# Patient Record
Sex: Male | Born: 1955
Health system: Southern US, Community
[De-identification: ages and names within clinical notes are randomized; demographics above are authoritative.]

## PROBLEM LIST (undated history)

## (undated) DIAGNOSIS — H40009 Preglaucoma, unspecified, unspecified eye: Secondary | ICD-10-CM

## (undated) DIAGNOSIS — Z8719 Personal history of other diseases of the digestive system: Secondary | ICD-10-CM

## (undated) DIAGNOSIS — Z973 Presence of spectacles and contact lenses: Secondary | ICD-10-CM

## (undated) DIAGNOSIS — R350 Frequency of micturition: Secondary | ICD-10-CM

## (undated) DIAGNOSIS — K219 Gastro-esophageal reflux disease without esophagitis: Secondary | ICD-10-CM

## (undated) DIAGNOSIS — E119 Type 2 diabetes mellitus without complications: Secondary | ICD-10-CM

## (undated) DIAGNOSIS — C61 Malignant neoplasm of prostate: Secondary | ICD-10-CM

## (undated) DIAGNOSIS — E785 Hyperlipidemia, unspecified: Secondary | ICD-10-CM

## (undated) DIAGNOSIS — G4733 Obstructive sleep apnea (adult) (pediatric): Secondary | ICD-10-CM

## (undated) DIAGNOSIS — Z860101 Personal history of adenomatous and serrated colon polyps: Secondary | ICD-10-CM

## (undated) DIAGNOSIS — E041 Nontoxic single thyroid nodule: Secondary | ICD-10-CM

## (undated) DIAGNOSIS — T8859XA Other complications of anesthesia, initial encounter: Secondary | ICD-10-CM

## (undated) DIAGNOSIS — S83207A Unspecified tear of unspecified meniscus, current injury, left knee, initial encounter: Secondary | ICD-10-CM

## (undated) DIAGNOSIS — I4510 Unspecified right bundle-branch block: Secondary | ICD-10-CM

## (undated) DIAGNOSIS — I10 Essential (primary) hypertension: Secondary | ICD-10-CM

## (undated) DIAGNOSIS — N182 Chronic kidney disease, stage 2 (mild): Secondary | ICD-10-CM

## (undated) DIAGNOSIS — R32 Unspecified urinary incontinence: Secondary | ICD-10-CM

## (undated) DIAGNOSIS — G8929 Other chronic pain: Secondary | ICD-10-CM

## (undated) DIAGNOSIS — M1990 Unspecified osteoarthritis, unspecified site: Secondary | ICD-10-CM

## (undated) DIAGNOSIS — S86019A Strain of unspecified Achilles tendon, initial encounter: Secondary | ICD-10-CM

## (undated) DIAGNOSIS — Z9989 Dependence on other enabling machines and devices: Secondary | ICD-10-CM

## (undated) DIAGNOSIS — M109 Gout, unspecified: Secondary | ICD-10-CM

## (undated) DIAGNOSIS — G629 Polyneuropathy, unspecified: Secondary | ICD-10-CM

## (undated) DIAGNOSIS — J309 Allergic rhinitis, unspecified: Secondary | ICD-10-CM

## (undated) DIAGNOSIS — T4145XA Adverse effect of unspecified anesthetic, initial encounter: Secondary | ICD-10-CM

## (undated) DIAGNOSIS — Z86010 Personal history of colonic polyps: Secondary | ICD-10-CM

## (undated) DIAGNOSIS — M549 Dorsalgia, unspecified: Secondary | ICD-10-CM

## (undated) HISTORY — PX: UVULOPALATOPHARYNGOPLASTY (UPPP)/TONSILLECTOMY/SEPTOPLASTY: SHX6164

## (undated) HISTORY — DX: Strain of unspecified achilles tendon, initial encounter: S86.019A

## (undated) HISTORY — PX: CARDIAC CATHETERIZATION: SHX172

## (undated) HISTORY — PX: BACK SURGERY: SHX140

---

## 1980-01-17 HISTORY — PX: OTHER SURGICAL HISTORY: SHX169

## 1988-09-16 HISTORY — PX: UVULOPALATOPHARYNGOPLASTY (UPPP)/TONSILLECTOMY/SEPTOPLASTY: SHX6164

## 1998-01-25 ENCOUNTER — Ambulatory Visit: Admission: RE | Admit: 1998-01-25 | Discharge: 1998-01-25 | Payer: Self-pay | Admitting: Internal Medicine

## 1998-04-04 ENCOUNTER — Ambulatory Visit: Admission: RE | Admit: 1998-04-04 | Discharge: 1998-04-04 | Payer: Self-pay | Admitting: Otolaryngology

## 1998-07-01 ENCOUNTER — Ambulatory Visit: Admission: RE | Admit: 1998-07-01 | Discharge: 1998-07-01 | Payer: Self-pay | Admitting: Otolaryngology

## 1998-07-01 ENCOUNTER — Encounter: Payer: Self-pay | Admitting: Otolaryngology

## 1998-07-16 ENCOUNTER — Ambulatory Visit (HOSPITAL_COMMUNITY): Admission: RE | Admit: 1998-07-16 | Discharge: 1998-07-17 | Payer: Self-pay | Admitting: Otolaryngology

## 1998-11-22 ENCOUNTER — Ambulatory Visit: Admission: RE | Admit: 1998-11-22 | Discharge: 1998-11-22 | Payer: Self-pay | Admitting: Otolaryngology

## 1999-08-05 ENCOUNTER — Emergency Department (HOSPITAL_COMMUNITY): Admission: EM | Admit: 1999-08-05 | Discharge: 1999-08-05 | Payer: Self-pay | Admitting: Emergency Medicine

## 1999-08-05 ENCOUNTER — Encounter: Payer: Self-pay | Admitting: Surgery

## 1999-08-23 ENCOUNTER — Ambulatory Visit (HOSPITAL_COMMUNITY): Admission: RE | Admit: 1999-08-23 | Discharge: 1999-08-23 | Payer: Self-pay | Admitting: Gastroenterology

## 2002-06-04 ENCOUNTER — Encounter: Payer: Self-pay | Admitting: General Surgery

## 2002-06-05 ENCOUNTER — Ambulatory Visit (HOSPITAL_COMMUNITY): Admission: RE | Admit: 2002-06-05 | Discharge: 2002-06-05 | Payer: Self-pay | Admitting: General Surgery

## 2002-06-05 HISTORY — PX: ANAL FISSURE REPAIR: SHX2312

## 2006-12-17 LAB — HM COLONOSCOPY

## 2007-02-25 ENCOUNTER — Ambulatory Visit (HOSPITAL_COMMUNITY): Admission: RE | Admit: 2007-02-25 | Discharge: 2007-02-25 | Payer: Self-pay | Admitting: Gastroenterology

## 2007-12-05 ENCOUNTER — Ambulatory Visit (HOSPITAL_COMMUNITY): Admission: RE | Admit: 2007-12-05 | Discharge: 2007-12-05 | Payer: Self-pay | Admitting: Internal Medicine

## 2007-12-31 ENCOUNTER — Encounter (HOSPITAL_COMMUNITY): Admission: RE | Admit: 2007-12-31 | Discharge: 2008-01-14 | Payer: Self-pay | Admitting: Cardiology

## 2008-01-01 HISTORY — PX: CARDIOVASCULAR STRESS TEST: SHX262

## 2009-08-05 ENCOUNTER — Ambulatory Visit (HOSPITAL_COMMUNITY): Admission: RE | Admit: 2009-08-05 | Discharge: 2009-08-05 | Payer: Self-pay | Admitting: Gastroenterology

## 2009-08-07 LAB — HM COLONOSCOPY

## 2010-02-06 ENCOUNTER — Encounter: Payer: Self-pay | Admitting: Gastroenterology

## 2010-04-02 LAB — GLUCOSE, CAPILLARY: Glucose-Capillary: 91 mg/dL (ref 70–99)

## 2010-05-25 ENCOUNTER — Other Ambulatory Visit (HOSPITAL_COMMUNITY): Payer: Self-pay | Admitting: Internal Medicine

## 2010-05-25 ENCOUNTER — Ambulatory Visit (HOSPITAL_COMMUNITY)
Admission: RE | Admit: 2010-05-25 | Discharge: 2010-05-25 | Disposition: A | Discharge status: Home / Self Care | Payer: BC Managed Care – PPO | Source: Ambulatory Visit | Attending: Internal Medicine | Admitting: Internal Medicine

## 2010-05-25 DIAGNOSIS — W06XXXA Fall from bed, initial encounter: Secondary | ICD-10-CM | POA: Insufficient documentation

## 2010-05-25 DIAGNOSIS — M542 Cervicalgia: Secondary | ICD-10-CM

## 2010-06-03 NOTE — Op Note (Signed)
   NAMEESMOND, HINCH                          ACCOUNT NO.:  [id number]   MEDICAL RECORD NO.:  [id number]                   PATIENT TYPE:  AMB   LOCATION:  DAY                                  FACILITY:  [facility]   PHYSICIAN:  Timothy E. Earlene Plater, M.D.              DATE OF BIRTH:  May 18, 1955   DATE OF PROCEDURE:  06/05/2002  DATE OF DISCHARGE:                                 OPERATIVE REPORT   PREOPERATIVE DIAGNOSIS:  Anal fissure.   POSTOPERATIVE DIAGNOSIS:  Anal fissure.   PROCEDURE:  Repair of anal fissure.   SURGEON:  Timothy E. Earlene Plater, M.D.   ANESTHESIA:  General.   INDICATIONS FOR PROCEDURE:  Mr. Riedl has been seen and followed in the  office. He has failed conservative management. The anal fissure and its  attended pain and bleeding have persisted and after careful discussion, he  wishes to proceed with surgery. He was not able to follow a liquid diet or  all of the usual conservative management. His attendant factors include  morbid obesity, hypertension. He was identified and the permit signed,  evaluated by anesthesia.  A normal chemistry profile, CBC and urinalysis was  noted. A normal chest x-ray was noted and an abnormal EKG was noted.   DESCRIPTION OF PROCEDURE:  He was taken to the operating room, placed  supine, general endotracheal anesthesia administered. He was placed in  lithotomy position. Perianal area inspected, prepped and draped in the usual  fashion. The anus was spastic even under a general LMA anesthesia. An acute  posterior anal fissure was present. Digital exam was otherwise negative. The  internal sphincter was easily isolated and using a percutaneous 15 blade in  the left lateral position, an internal sphincterotomy was accomplished. This  allowed for dilatation. The operating anoscope introduced, no other  pathology noted. The fissure was cauterized in its entirety on the surface  only. This completed the procedure, Gelfoam gauze and a dry sterile  dressing  applied. He was awakened and taken to the recovery room in good condition.   Written and verbal instructions including Percocet 5 mg #36 and he will be  followed as an outpatient.                                               Timothy E. Earlene Plater, M.D.    TED/MEDQ  D:  06/05/2002  T:  06/05/2002  Job:  [id number]   cc:   Lovenia Kim, D.O.  [address]  [place]  [place] [address]  Fax: [phone]   Bernette Redbird, M.D.  [address]  [place], [place] [address]  Fax: [phone]

## 2010-06-03 NOTE — Procedures (Signed)
[facility]  Patient:    Jason Wyatt, Jason Wyatt                       MRN: [id number] Proc. Date: 08/23/99 Adm. Date:  [id number] Attending:  Deneen Harts CC:         Ammie Dalton, M.D.                           Procedure Report  PROCEDURE:  Colonoscopy.  INDICATION FOR PROCEDURE:  A 55 year old white male undergoing colonoscopy for neoplasia surveillance, to further evaluate Hemoccult positive stool, and because of right lower quadrant abdominal pain beginning approximately 1 month ago.  DESCRIPTION OF PROCEDURE:  After reviewing the nature of the procedure with the patient including the potential risks of hemorrhage and perforation, informed consent was signed.  The patient was premedicated receiving IV sedation totaling Versed 10 mg, fentanyl 100 mcg administered in divided doses prior to and during the course of the procedure.  Using an Olympus CF-140L video colonoscope, the rectum was intubated after a digital examination revealed no evidence of perianal or intrarectal pathology. The scope was advanced around the entire length of the colon to the cecum identified by the appendiceal orifice and ileocecal valve. Deep intubation of the cecum was difficult and required rotating this obese patient into a right lateral decubitus position. With this maneuver, successful study was completed. The cecum was identified by the appendiceal orifice and the ileocecal valve. Preparation was good to the right colon where it was poor due to retained stool. This was irrigated clear as much as possible.  The scope was slowly withdrawn with careful inspection throughout the entire colon in a retrograde manner.  No abnormality was noted. Specifically, there was no evidence of neoplasia, diverticular change, mucosal inflammation or vascular lesion. Normal retroflexed view in the rectal vault.  The colon was decompressed, scope withdrawn. The patient tolerated  the procedure without difficulty being maintained on DataScope monitor and low flow oxygen throughout.  Time 2, technical 2, preparation 2, total score equal 6.  ASSESSMENT: 1. Normal colonoscopy. 2. No explanation for Hemoccult positive stool o right lower quadrant    pain. Suspect occult positive stool may be due to anorectal source. The    right lower quadrant pain is extremely superficial, doubt GI origin.  RECOMMENDATIONS: 1. Colonoscopy--repeat in 10 years for neoplasia surveillance. 2. Annual Hemoccult--per Dr. Theda Belfast. 3. Consider laparoscopy if right lower quadrant pain persists. Also consider    abdominal/pelvic CT scan. 4. Rectal care p.r.n.--if patient notes bright red blood per rectum initiate    therapy with Anusol-HC suppository 1 p.r. b.i.d. for 5-7 days. DD:  08/23/99 TD:  08/24/99 Job: [id number]

## 2010-06-03 NOTE — Consult Note (Signed)
[place]  Patient:    Jason Wyatt, Jason Wyatt                       MRN: [id number] Proc. Date: 08/05/99 Adm. Date:  [id number] Disc. Date: [id number] Attending:  Osvaldo Human CC:         Ammie Dalton, M.D.                          Consultation Report  CHIEF COMPLAINT:  Abdominal pain.  HISTORY OF PRESENT ILLNESS:  Mr. Rayder Sullenger is a 55 year old morbidly obese gentleman.  He is a patient of Dr. Theda Belfast who has come in complaining of a several week history of worsening right lower quadrant abdominal pain.  He reports the pain came on gradually several weeks ago and now has gotten worse especially when he stands up and moves.  He reports he has also developed some nausea with this.  He denies any anorexia, he denies any fevers or chills.  He has had no emesis.  He reports he moves his bowel normal.  He has no dysuria, and he has basically no other complaints.  PAST MEDICAL HISTORY:  Unremarkable for morbid obesity.  MEDICATIONS:  Hypertension medication.  PAST SURGICAL HISTORY:  Negative.  ALLERGIES:  He has no known drug allergies.  SOCIAL HISTORY:  He does not smoke.  He does not drink alcohol.  PHYSICAL EXAMINATION:  GENERAL:  Reveals a morbidly obese gentleman in no acute distress.  He is afebrile.  His vital signs are stable.  HEENT:  He is anicteric.  Oropharynx clear.  NECK:   Supple.  LUNGS:  Clear to auscultation bilaterally.  CARDIOVASCULAR:  Regular rate and rhythm.  ABDOMEN:  Very obese. There is some mild tenderness with guarding in the right lower quadrant with him both lying and standing.  No hernias to be detected.  EXTREMITIES:  Warm and well perfused.  LABORATORY DATA:  The patient has multiple laboratory data including normal electrolytes, normal liver function tests, normal lipase.  CBC shows a white blood count of 11.6, with normal hemoglobin, hematocrit and platelets.  The patient has an acute  abdominal series that shows a normal bowel gas pattern.  He also has a CAT scan of the abdomen and pelvis that shows normal liver, kidneys, pancreas and spleen as well as a normal appendix and no intra-abdominal pathology.  IMPRESSION:  Patient with right lower quadrant abdominal pain uncertain etiology.  This may represent a muscle strain.  Again, I cannot feel a hernia and CAT scan shows no evidence of intra-abdominal inflammation.  At this point the plan is to let him go home from the emergency department.  He will follow up with Dr. Theda Belfast via phone call Monday.  He will follow up with surgeons on a p.r.n. basis. DD:  08/05/99 TD:  08/08/99 Job: [id number]

## 2010-06-12 ENCOUNTER — Emergency Department (HOSPITAL_COMMUNITY): Payer: BC Managed Care – PPO

## 2010-06-12 ENCOUNTER — Emergency Department (HOSPITAL_COMMUNITY)
Admission: EM | Admit: 2010-06-12 | Discharge: 2010-06-12 | Disposition: A | Discharge status: Home / Self Care | Payer: BC Managed Care – PPO | Attending: Emergency Medicine | Admitting: Emergency Medicine

## 2010-06-12 DIAGNOSIS — I10 Essential (primary) hypertension: Secondary | ICD-10-CM | POA: Insufficient documentation

## 2010-06-12 DIAGNOSIS — Z79899 Other long term (current) drug therapy: Secondary | ICD-10-CM | POA: Insufficient documentation

## 2010-06-12 DIAGNOSIS — M79609 Pain in unspecified limb: Secondary | ICD-10-CM | POA: Insufficient documentation

## 2010-06-12 DIAGNOSIS — M542 Cervicalgia: Secondary | ICD-10-CM | POA: Insufficient documentation

## 2010-06-12 DIAGNOSIS — E119 Type 2 diabetes mellitus without complications: Secondary | ICD-10-CM | POA: Insufficient documentation

## 2010-06-12 DIAGNOSIS — E785 Hyperlipidemia, unspecified: Secondary | ICD-10-CM | POA: Insufficient documentation

## 2010-06-12 DIAGNOSIS — M4800 Spinal stenosis, site unspecified: Secondary | ICD-10-CM

## 2010-06-14 ENCOUNTER — Ambulatory Visit
Admit: 2010-06-14 | Discharge: 2010-06-14 | Disposition: A | Discharge status: Home / Self Care | Payer: BC Managed Care – PPO | Attending: Neurosurgery | Admitting: Neurosurgery

## 2010-06-29 ENCOUNTER — Other Ambulatory Visit (HOSPITAL_COMMUNITY): Payer: Self-pay | Admitting: Neurosurgery

## 2010-06-29 ENCOUNTER — Ambulatory Visit (HOSPITAL_COMMUNITY)
Admission: RE | Admit: 2010-06-29 | Discharge: 2010-06-29 | Disposition: A | Discharge status: Home / Self Care | Payer: BC Managed Care – PPO | Source: Ambulatory Visit | Attending: Neurosurgery | Admitting: Neurosurgery

## 2010-06-29 ENCOUNTER — Encounter (HOSPITAL_COMMUNITY)
Admission: RE | Admit: 2010-06-29 | Discharge: 2010-06-29 | Disposition: A | Discharge status: Home / Self Care | Payer: BC Managed Care – PPO | Source: Ambulatory Visit | Attending: Neurosurgery | Admitting: Neurosurgery

## 2010-06-29 DIAGNOSIS — Z01818 Encounter for other preprocedural examination: Secondary | ICD-10-CM | POA: Insufficient documentation

## 2010-06-29 DIAGNOSIS — Z01812 Encounter for preprocedural laboratory examination: Secondary | ICD-10-CM | POA: Insufficient documentation

## 2010-06-29 DIAGNOSIS — Z01811 Encounter for preprocedural respiratory examination: Secondary | ICD-10-CM

## 2010-06-29 LAB — BASIC METABOLIC PANEL
BUN: 17 mg/dL (ref 6–23)
CO2: 29 mEq/L (ref 19–32)
Calcium: 9.3 mg/dL (ref 8.4–10.5)
Chloride: 107 mEq/L (ref 96–112)
Creatinine, Ser: 1.03 mg/dL (ref 0.4–1.5)
GFR calc Af Amer: >60 mL/min (ref >60)
GFR calc non Af Amer: >60 mL/min (ref >60)
Glucose, Bld: 115 mg/dL — ABNORMAL HIGH (ref 70–99)
Potassium: 4.5 mEq/L (ref 3.5–5.1)
Sodium: 144 mEq/L (ref 135–145)

## 2010-06-29 LAB — CBC
HCT: 44.2 % (ref 39.0–52.0)
Hemoglobin: 14.7 g/dL (ref 13.0–17.0)
MCH: 27.3 pg (ref 26.0–34.0)
MCHC: 33.3 g/dL (ref 30.0–36.0)
MCV: 82 fL (ref 78.0–100.0)
Platelets: 218 10*3/uL (ref 150–400)
RBC: 5.39 MIL/uL (ref 4.22–5.81)
RDW: 17.1 % — ABNORMAL HIGH (ref 11.5–15.5)
WBC: 9.5 10*3/uL (ref 4.0–10.5)

## 2010-06-29 LAB — SURGICAL PCR SCREEN
MRSA, PCR: NEGATIVE
Staphylococcus aureus: NEGATIVE

## 2010-07-05 ENCOUNTER — Inpatient Hospital Stay (HOSPITAL_COMMUNITY)
Admission: RE | Admit: 2010-07-05 | Discharge: 2010-07-08 | DRG: 558 | Disposition: A | Discharge status: Home Health | Payer: BC Managed Care – PPO | Source: Ambulatory Visit | Attending: Neurosurgery | Admitting: Neurosurgery

## 2010-07-05 ENCOUNTER — Inpatient Hospital Stay (HOSPITAL_COMMUNITY): Payer: BC Managed Care – PPO

## 2010-07-05 DIAGNOSIS — I9589 Other hypotension: Secondary | ICD-10-CM | POA: Diagnosis not present

## 2010-07-05 DIAGNOSIS — M4712 Other spondylosis with myelopathy, cervical region: Principal | ICD-10-CM | POA: Diagnosis present

## 2010-07-05 DIAGNOSIS — E7800 Pure hypercholesterolemia, unspecified: Secondary | ICD-10-CM | POA: Diagnosis present

## 2010-07-05 DIAGNOSIS — J9600 Acute respiratory failure, unspecified whether with hypoxia or hypercapnia: Secondary | ICD-10-CM

## 2010-07-05 DIAGNOSIS — J95821 Acute postprocedural respiratory failure: Secondary | ICD-10-CM | POA: Diagnosis not present

## 2010-07-05 DIAGNOSIS — I10 Essential (primary) hypertension: Secondary | ICD-10-CM | POA: Diagnosis present

## 2010-07-05 DIAGNOSIS — Z6841 Body Mass Index (BMI) 40.0 and over, adult: Secondary | ICD-10-CM

## 2010-07-05 DIAGNOSIS — Z9981 Dependence on supplemental oxygen: Secondary | ICD-10-CM

## 2010-07-05 DIAGNOSIS — K219 Gastro-esophageal reflux disease without esophagitis: Secondary | ICD-10-CM | POA: Diagnosis present

## 2010-07-05 DIAGNOSIS — G4733 Obstructive sleep apnea (adult) (pediatric): Secondary | ICD-10-CM | POA: Diagnosis present

## 2010-07-05 DIAGNOSIS — M109 Gout, unspecified: Secondary | ICD-10-CM | POA: Diagnosis present

## 2010-07-05 DIAGNOSIS — I2510 Atherosclerotic heart disease of native coronary artery without angina pectoris: Secondary | ICD-10-CM | POA: Diagnosis present

## 2010-07-05 DIAGNOSIS — M488X2 Other specified spondylopathies, cervical region: Secondary | ICD-10-CM | POA: Diagnosis present

## 2010-07-05 DIAGNOSIS — E119 Type 2 diabetes mellitus without complications: Secondary | ICD-10-CM | POA: Diagnosis present

## 2010-07-05 HISTORY — PX: POSTERIOR FUSION CERVICAL SPINE: SUR628

## 2010-07-05 LAB — CBC
HCT: 33.7 % — ABNORMAL LOW (ref 39.0–52.0)
Hemoglobin: 11.4 g/dL — ABNORMAL LOW (ref 13.0–17.0)
MCH: 27.7 pg (ref 26.0–34.0)
MCHC: 33.8 g/dL (ref 30.0–36.0)
MCV: 81.8 fL (ref 78.0–100.0)
Platelets: 154 10*3/uL (ref 150–400)
RBC: 4.12 MIL/uL — ABNORMAL LOW (ref 4.22–5.81)
RDW: 17.3 % — ABNORMAL HIGH (ref 11.5–15.5)
WBC: 7.9 10*3/uL (ref 4.0–10.5)

## 2010-07-05 LAB — BLOOD GAS, ARTERIAL
Acid-Base Excess: 0.3 mmol/L (ref 0.0–2.0)
Bicarbonate: 25.3 mEq/L — ABNORMAL HIGH (ref 20.0–24.0)
Drawn by: 34763
FIO2: 0.4 %
MECHVT: 620 mL
O2 Saturation: 94.1 %
PEEP: 5 cmH2O
Patient temperature: 98.6
RATE: 16 resp/min
TCO2: 26.8 mmol/L (ref 0–100)
pCO2 arterial: 48.2 mmHg — ABNORMAL HIGH (ref 35.0–45.0)
pH, Arterial: 7.341 — ABNORMAL LOW (ref 7.350–7.450)
pO2, Arterial: 74.2 mmHg — ABNORMAL LOW (ref 80.0–100.0)

## 2010-07-05 LAB — COMPREHENSIVE METABOLIC PANEL
ALT: 13 U/L (ref 0–53)
AST: 16 U/L (ref 0–37)
Albumin: 2.7 g/dL — ABNORMAL LOW (ref 3.5–5.2)
Alkaline Phosphatase: 40 U/L (ref 39–117)
BUN: 13 mg/dL (ref 6–23)
CO2: 26 mEq/L (ref 19–32)
Calcium: 8.2 mg/dL — ABNORMAL LOW (ref 8.4–10.5)
Chloride: 105 mEq/L (ref 96–112)
Creatinine, Ser: 0.89 mg/dL (ref 0.50–1.35)
GFR calc Af Amer: >60 mL/min (ref >60)
GFR calc non Af Amer: >60 mL/min (ref >60)
Glucose, Bld: 120 mg/dL — ABNORMAL HIGH (ref 70–99)
Potassium: 4 mEq/L (ref 3.5–5.1)
Sodium: 137 mEq/L (ref 135–145)
Total Bilirubin: 0.3 mg/dL (ref 0.3–1.2)
Total Protein: 5.3 g/dL — ABNORMAL LOW (ref 6.0–8.3)

## 2010-07-05 LAB — GLUCOSE, CAPILLARY
Glucose-Capillary: 91 mg/dL (ref 70–99)
Glucose-Capillary: 113 mg/dL — ABNORMAL HIGH (ref 70–99)
Glucose-Capillary: 124 mg/dL — ABNORMAL HIGH (ref 70–99)
Glucose-Capillary: 124 mg/dL — ABNORMAL HIGH (ref 70–99)

## 2010-07-05 LAB — CARDIAC PANEL(CRET KIN+CKTOT+MB+TROPI)
CK, MB: 5.8 ng/mL — ABNORMAL HIGH (ref 0.3–4.0)
Relative Index: 3.2 — ABNORMAL HIGH (ref 0.0–2.5)
Total CK: 181 U/L (ref 7–232)
Troponin I: <0.3 ng/mL (ref <0.30)

## 2010-07-06 DIAGNOSIS — G4730 Sleep apnea, unspecified: Secondary | ICD-10-CM

## 2010-07-06 LAB — BASIC METABOLIC PANEL
BUN: 11 mg/dL (ref 6–23)
CO2: 26 mEq/L (ref 19–32)
Calcium: 8.5 mg/dL (ref 8.4–10.5)
Chloride: 105 mEq/L (ref 96–112)
Creatinine, Ser: 0.84 mg/dL (ref 0.50–1.35)
GFR calc Af Amer: >60 mL/min (ref >60)
GFR calc non Af Amer: >60 mL/min (ref >60)
Glucose, Bld: 101 mg/dL — ABNORMAL HIGH (ref 70–99)
Potassium: 4 mEq/L (ref 3.5–5.1)
Sodium: 137 mEq/L (ref 135–145)

## 2010-07-06 LAB — TROPONIN I: Troponin I: <0.3 ng/mL (ref <0.30)

## 2010-07-06 LAB — GLUCOSE, CAPILLARY
Glucose-Capillary: 103 mg/dL — ABNORMAL HIGH (ref 70–99)
Glucose-Capillary: 104 mg/dL — ABNORMAL HIGH (ref 70–99)
Glucose-Capillary: 112 mg/dL — ABNORMAL HIGH (ref 70–99)
Glucose-Capillary: 121 mg/dL — ABNORMAL HIGH (ref 70–99)
Glucose-Capillary: 123 mg/dL — ABNORMAL HIGH (ref 70–99)
Glucose-Capillary: 99 mg/dL (ref 70–99)

## 2010-07-06 LAB — CK: Total CK: 186 U/L (ref 7–232)

## 2010-07-06 LAB — PHOSPHORUS: Phosphorus: 2.5 mg/dL (ref 2.3–4.6)

## 2010-07-06 LAB — MAGNESIUM: Magnesium: 1.8 mg/dL (ref 1.5–2.5)

## 2010-07-07 LAB — GLUCOSE, CAPILLARY
Glucose-Capillary: 95 mg/dL (ref 70–99)
Glucose-Capillary: 107 mg/dL — ABNORMAL HIGH (ref 70–99)
Glucose-Capillary: 133 mg/dL — ABNORMAL HIGH (ref 70–99)
Glucose-Capillary: 146 mg/dL — ABNORMAL HIGH (ref 70–99)

## 2010-07-08 LAB — GLUCOSE, CAPILLARY
Glucose-Capillary: 155 mg/dL — ABNORMAL HIGH (ref 70–99)
Glucose-Capillary: 161 mg/dL — ABNORMAL HIGH (ref 70–99)

## 2010-07-19 NOTE — Op Note (Signed)
NAMERAHUL, MALINAK NO.:  [id number]  MEDICAL RECORD NO.:  [id number]  LOCATION:  3102                         FACILITY:  [facility]  PHYSICIAN:  Danae Orleans. Venetia Maxon, M.D.  DATE OF BIRTH:  04/14/1955  DATE OF PROCEDURE:  07/05/2010 DATE OF DISCHARGE:                              OPERATIVE REPORT   PREOPERATIVE DIAGNOSIS:  Severe cervical stenosis with ossification of posterior longitudinal ligament with spondylosis with cervical myelopathy, C2 through C4 levels.  POSTOPERATIVE DIAGNOSIS:  Severe cervical stenosis with ossification of posterior longitudinal ligament with spondylosis with cervical myelopathy, C2 through C4 levels.  PROCEDURE:  Posterior cervical laminectomy C1-2 through C4 with C2 through C4 posterior instrumentation with posterolateral arthrodesis C2 through C4 levels.  SURGEON:  Danae Orleans. Venetia Maxon, MD  ASSISTANT:  Clydene Fake, MD  ANESTHESIA:  General endotracheal anesthesia.  ESTIMATED BLOOD LOSS:  400 mL.  COMPLICATIONS:  None.  DISPOSITION:  To recovery.  INDICATIONS:  Yovan Leeman is a 55 year old man with profound cervical stenosis at C2 through C4 levels with severe spinal cord compression at these levels.  He has a cervical myelopathy.  This appears to be consistent with ossification of the posterior longitudinal ligament.  It was elected to take him to surgery for posterior decompression and fixation.  Mr. Kinnear is morbidly obese with obstructive sleep apnea and diabetes with a history of coronary artery disease.  PROCEDURE:  Mr. Levene was brought to the operating room.  Following satisfactory and uncomplicated induction of general endotracheal anesthesia, the patient was placed in a cervical collar to be turned onto the operating table.  After about 45 minutes of careful positioning with taping and relining and maintaining the neck in a normal anatomical alignment, the patient was felt to be well positioned on the  operating table.  His posterior neck was then imaged with C-arm and C2 level was identified.  The skin was shaved, then prepped and draped with Betadine scrub and paint.  Area of planned incision was infiltrated with local lidocaine.  Incision was made in the posterior neck.  Through approximately 4 inches of adipose tissue to the posterior cervical fascia which was then incised and using subperiosteal dissection, the C2, C3, C4 levels were widely exposed.  This required use of an 85-mm retractor blades with Versatrac retractor system to move the soft tissue out of the operative field.  After confirming correct level at the level of C2 with a marker probe at C2, the 12 x 3.5-mm screws were placed at C3 and C4 bilaterally in the lateral masses according to the standard landmarks.  The posterior cervical decompression was then performed from C4 all the way to the inferior aspect of C1 and spinal cord dura was decompressed broadly.  This was done under loupe magnification with 2 and 3-mm gold tip Kerrison rongeurs.  It was then elected to place C2 pedicle screws.  Initially, this was done on the right side and the medial aspect of the pedicle was identified within the canal.  The positioning for entry was determined and using initially 12-mm drill and then subsequently exchanged for 24-mm drill and then final a 32-mm drill, the C2 pedicle was  drilled.  There was some what appeared to be venous bleeding and there was felt to be lateral breech in the trajectory of the pedicle screw.  This was not felt to represent injury to the vertebral artery but at the same time, we did not feel that it would be prudent to place an additional screw on the left in case there was some compromise of the vertebral artery within the foramen. Consequently, a 4 mm x 32-mm pedicle screw was placed on the right and its positioning was confirmed on fluoroscopy.  There was no medial breech of the pedicle.  The  posterolateral region was then extensively decorticated from C2 through C4 bilaterally using drill and the bone removed with a laminectomy was then placed in the posterolateral region and tamped into position.  A rod was lordosed to fit the C2 through C4 instrumentation on the right and a smaller rod was used for the instrumentation at C3 through C4 on the left.  All screws were locked down in situ and counter torqued appropriately.  Because of the patient's large size and prominent epidural veins,  it was elected to place a medium Hemovac drain and this was placed through separate stab incision and anchored with a 3-0 nylon stitch.  The retractor was removed.  The posterior fascia was reapproximated with 0 Vicryl sutures, skin edges were reapproximated with 2-0 Vicryl interrupted inverted sutures, and after 2-0 Vicryl stitches, the stapler was used to reapproximate skin edges.  Wound was dressed with a sterile occlusive dressing.  The patient was then returned to supine position on the stretcher.  I removed the three pin fixation and he was placed in a cervical collar.  He was moving all four extremities and following commands prior to extubation but upon being extubated was not able to phonate and it was elected to reintubate him in the operating room.  I then discussed his condition with the Critical Care Medicine Service and it was elected because the patient had some fairly prominent facial edema and because of his large size as well as his history of obstructive sleep apnea that we would plan on admitting to the ICU and that we would keep him intubated overnight and then wean him afterwards.     Danae Orleans. Venetia Maxon, M.D.     JDS/MEDQ  D:  07/05/2010  T:  07/06/2010  Job:  [id number]  Electronically Signed by Maeola Harman M.D. on 07/19/2010 08:49:19 AM

## 2010-07-22 NOTE — Discharge Summary (Signed)
  NAMETHADIUS, SMISEK NO.:  [id number]  MEDICAL RECORD NO.:  [id number]  LOCATION:  3033                         FACILITY:  [facility]  PHYSICIAN:  Danae Orleans. Venetia Maxon, M.D.  DATE OF BIRTH:  August 10, 1955  DATE OF ADMISSION:  07/05/2010 DATE OF DISCHARGE:  07/08/2010                              DISCHARGE SUMMARY   REASON FOR ADMISSION:  Severe cervical stenosis with ossification of posterior longitudinal ligament with spondylosis and cervical myelopathy C2 through C4 levels.  FINAL DIAGNOSIS:  Severe cervical stenosis with ossification of posterior longitudinal ligament with spondylosis and cervical myelopathy C2 through C4 levels.  ADDITIONAL DIAGNOSIS:  Respiratory failure immediately following extubation with overnight mechanical ventilation.  HISTORY OF PRESENT ILLNESS:  Jason Wyatt is a 55 year old man with cervical stenosis, C2 through C4 levels with severe spinal cord compression at these levels with cervical myelopathy which was felt to be consistent with ossification of the posterior longitudinal ligament. The patient is over 300 pounds and it was elected to take him to surgery for posterior decompression and fusion.  He is morbidly obese with obstructive sleep apnea, history of diabetes, and coronary artery disease.  The surgery was uneventful postoperatively, however, he was extubated and became apneic and then was reintubated.  He was taken to the Neuro ICU where he was observed and remained intubated overnight. He had a drain which has drained 40 mL the first night.  He was extubated, has Foley removed, was gradually mobilized, and with a Hemovac drain was removed on July 07, 2010.  He was transferred to the floor on July 07, 2010, and then discharged home on July 08, 2010, having tolerated his operation and hospitalization well.  DISCHARGE MEDICATIONS:  Include Percocet, methylfolate, Remitax, vitamin D, furosemide, phentermine, Androderm, metformin,  Cymbalta, Nasonex, lisinopril, lansoprazole, enalapril.  DISCHARGE CONDITION:  Improved.     Danae Orleans. Venetia Maxon, M.D.     JDS/MEDQ  D:  07/19/2010  T:  07/20/2010  Job:  [id number]  Electronically Signed by Maeola Harman M.D. on 07/22/2010 09:20:43 AM

## 2011-01-20 ENCOUNTER — Other Ambulatory Visit: Payer: Self-pay | Admitting: Pain Medicine

## 2011-01-20 DIAGNOSIS — M545 Low back pain, unspecified: Secondary | ICD-10-CM

## 2011-01-21 ENCOUNTER — Ambulatory Visit
Admission: RE | Admit: 2011-01-21 | Discharge: 2011-01-21 | Disposition: A | Discharge status: Home / Self Care | Payer: BC Managed Care – PPO | Source: Ambulatory Visit | Attending: Pain Medicine | Admitting: Pain Medicine

## 2011-01-21 DIAGNOSIS — M545 Low back pain, unspecified: Secondary | ICD-10-CM

## 2011-04-07 ENCOUNTER — Other Ambulatory Visit: Payer: Self-pay

## 2011-04-07 DIAGNOSIS — E1159 Type 2 diabetes mellitus with other circulatory complications: Secondary | ICD-10-CM

## 2011-04-07 DIAGNOSIS — I83893 Varicose veins of bilateral lower extremities with other complications: Secondary | ICD-10-CM

## 2011-06-23 ENCOUNTER — Encounter: Payer: Self-pay | Admitting: Vascular Surgery

## 2011-06-26 ENCOUNTER — Ambulatory Visit (INDEPENDENT_AMBULATORY_CARE_PROVIDER_SITE_OTHER): Payer: BC Managed Care – PPO | Admitting: *Deleted

## 2011-06-26 ENCOUNTER — Ambulatory Visit (INDEPENDENT_AMBULATORY_CARE_PROVIDER_SITE_OTHER): Payer: BC Managed Care – PPO | Admitting: Vascular Surgery

## 2011-06-26 ENCOUNTER — Encounter (INDEPENDENT_AMBULATORY_CARE_PROVIDER_SITE_OTHER): Payer: BC Managed Care – PPO | Admitting: *Deleted

## 2011-06-26 ENCOUNTER — Encounter: Payer: Self-pay | Admitting: Vascular Surgery

## 2011-06-26 VITALS — BP 145/77 | HR 108 | Resp 24 | Ht 74.0 in | Wt 375.0 lb

## 2011-06-26 DIAGNOSIS — G609 Hereditary and idiopathic neuropathy, unspecified: Secondary | ICD-10-CM

## 2011-06-26 DIAGNOSIS — R209 Unspecified disturbances of skin sensation: Secondary | ICD-10-CM

## 2011-06-26 DIAGNOSIS — I83893 Varicose veins of bilateral lower extremities with other complications: Secondary | ICD-10-CM

## 2011-06-26 DIAGNOSIS — R238 Other skin changes: Secondary | ICD-10-CM

## 2011-06-26 DIAGNOSIS — E1159 Type 2 diabetes mellitus with other circulatory complications: Secondary | ICD-10-CM

## 2011-06-26 DIAGNOSIS — I70209 Unspecified atherosclerosis of native arteries of extremities, unspecified extremity: Secondary | ICD-10-CM | POA: Insufficient documentation

## 2011-06-26 DIAGNOSIS — I8390 Asymptomatic varicose veins of unspecified lower extremity: Secondary | ICD-10-CM

## 2011-06-26 DIAGNOSIS — G629 Polyneuropathy, unspecified: Secondary | ICD-10-CM

## 2011-06-26 NOTE — Progress Notes (Signed)
Subjective:     Patient ID: Jason Wyatt, male   DOB: Aug 21, 1955, 56 y.o.   MRN: [id number]  HPI this 56 year old male was referred for leg pain and varicose veins. He has a long history of peripheral neuropathy from diabetes mellitus. He also has severe cervical and lumbar spine disease having previously had surgery on the cervical spine. He has had multiple injections in the lumbar spine. He has numbness in both legs and some instability with walking. He has no history of DVT, thrombophlebitis, stasis ulcers, bleeding, or severe edema in the ankles. It is not wear elastic compression stockings. He does have an ulcer on the sole of his right foot which is followed by a podiatrist.  Past Medical History  Diagnosis Date  . Varicose veins   . Diabetes mellitus     History  Substance Use Topics  . Smoking status: Never Smoker   . Smokeless tobacco: Not on file  . Alcohol Use: No    Family History  Problem Relation Age of Onset  . Hypertension Mother   . Cancer Father   . Hyperlipidemia Sister   . Hypertension Sister     Allergies  Allergen Reactions  . Lyrica (Pregabalin) Itching    Current outpatient prescriptions:acyclovir (ZOVIRAX) 400 MG tablet, Take 400 mg by mouth every 4 (four) hours while awake., Disp: , Rfl: ;  aspirin 81 MG tablet, Take 81 mg by mouth daily., Disp: , Rfl: ;  docusate sodium (COLACE) 100 MG capsule, Take 100 mg by mouth 2 (two) times daily., Disp: , Rfl: ;  DULoxetine (CYMBALTA) 60 MG capsule, Take 60 mg by mouth daily., Disp: , Rfl:  enalapril (VASOTEC) 20 MG tablet, Take 20 mg by mouth daily., Disp: , Rfl: ;  furosemide (LASIX) 40 MG tablet, Take 40 mg by mouth daily., Disp: , Rfl: ;  gabapentin (NEURONTIN) 300 MG capsule, Take 300 mg by mouth 3 (three) times daily., Disp: , Rfl: ;  glimepiride (AMARYL) 4 MG tablet, Take 4 mg by mouth daily before breakfast., Disp: , Rfl: ;  lansoprazole (PREVACID) 30 MG capsule, Take 30 mg by mouth daily., Disp: , Rfl:    metformin (FORTAMET) 1000 MG (OSM) 24 hr tablet, Take 1,000 mg by mouth daily with breakfast., Disp: , Rfl: ;  mometasone (NASONEX) 50 MCG/ACT nasal spray, Place 2 sprays into the nose daily., Disp: , Rfl: ;  omega-3 acid ethyl esters (LOVAZA) 1 G capsule, Take 2 g by mouth 2 (two) times daily., Disp: , Rfl: ;  simvastatin (ZOCOR) 20 MG tablet, Take 20 mg by mouth every evening., Disp: , Rfl:  testosterone cypionate (DEPOTESTOTERONE CYPIONATE) 100 MG/ML injection, Inject 200 mg into the muscle every 14 (fourteen) days. For IM use only, Disp: , Rfl:   BP 145/77  Pulse 108  Resp 24  Ht 6\' 2"  (1.88 m)  Wt 375 lb (170.099 kg)  BMI 48.15 kg/m2  Body mass index is 48.15 kg/(m^2).        Review of Systems denies chest pain. Does have a history of sleep apnea. Has occasional reflux esophagitis. Complains of numbness in both legs. No history of stroke or specific neurologic symptoms other than numbness.    Objective:   Physical Exam blood pressure 145/77 heart rate 108 respirations 24 Gen.-alert and oriented x3 in no apparent distress-obese  HEENT normal for age Lungs no rhonchi or wheezing Cardiovascular regular rhythm no murmurs carotid pulses 3+ palpable no bruits audible Abdomen soft nontender no palpable masses-obese  Musculoskeletal free of  major deformities Skin clear -no rashes Neurologic-decreased sensation both feet  Lower extremities 3+ femoral and posterior tibial pulses palpable bilaterally with no edema there are some small varicose veins in the posterior calf areas bilaterally below the knee. There is no hyperpigmentation or ulceration noted. No bulging varicosities along the greater saphenous system are noted.  Today I ordered a venous duplex exam of both legs which are reviewed and interpreted. He has some mild reflux in the left saphenofemoral junction but not throughout the great saphenous system. There is mild reflux in the deep systems. There is no DVT.       Assessment:     Bilateral leg pain with severe peripheral neuropathy and lumbar spine disease-no evidence of severe venous insufficiency Arterial supply to lower extremities is intact    Plan:     Return to see [place] on a when necessary basis. No specific treatment indicated for arterial or venous system

## 2011-07-03 NOTE — Procedures (Unsigned)
LOWER EXTREMITY VENOUS REFLUX EXAM  INDICATION:  Varicose veins  EXAM:  Using color-flow imaging and pulse Doppler spectral analysis, the bilateral common femoral, femoral, popliteal, posterior tibial, great and small saphenous veins were evaluated.  There is evidence suggesting deep venous reflux of >500 mL noted in the bilateral common femoral and femoral veins.  The right saphenofemoral junction and nontortuous great saphenous vein demonstrate competency.  The left saphenofemoral junction and nontortuous great saphenous vein demonstrate reflux of >500 milliseconds with diameter measurements as described below.   The bilateral proximal small saphenous veins demonstrate competency.  GSV Diameter (used if found to be incompetent only)                                           Right    Left Proximal Greater Saphenous Vein           cm       0.57 cm Proximal-to-mid-thigh                     cm       cm Mid thigh                                 cm       0.54 cm Mid-distal thigh                          cm       cm Distal thigh                              cm       0.48 cm Knee                                      cm       0.33 cm  IMPRESSION: 1. Left great saphenous vein reflux is noted, as described above. 2. Deep venous reflux is noted, as described above. 3. The right great saphenous vein and bilateral short saphenous veins     demonstrate competency.  ___________________________________________ Quita Skye Hart [place], M.D.  CH/MEDQ  D:  06/26/2011  T:  06/26/2011  Job:  [id number]

## 2011-07-05 ENCOUNTER — Emergency Department (HOSPITAL_BASED_OUTPATIENT_CLINIC_OR_DEPARTMENT_OTHER)
Admission: EM | Admit: 2011-07-05 | Discharge: 2011-07-05 | Disposition: A | Discharge status: Home / Self Care | Payer: BC Managed Care – PPO | Attending: Emergency Medicine | Admitting: Emergency Medicine

## 2011-07-05 ENCOUNTER — Encounter (HOSPITAL_BASED_OUTPATIENT_CLINIC_OR_DEPARTMENT_OTHER): Payer: Self-pay | Admitting: *Deleted

## 2011-07-05 DIAGNOSIS — E119 Type 2 diabetes mellitus without complications: Secondary | ICD-10-CM | POA: Insufficient documentation

## 2011-07-05 DIAGNOSIS — M109 Gout, unspecified: Secondary | ICD-10-CM | POA: Insufficient documentation

## 2011-07-05 DIAGNOSIS — E871 Hypo-osmolality and hyponatremia: Secondary | ICD-10-CM

## 2011-07-05 DIAGNOSIS — N289 Disorder of kidney and ureter, unspecified: Secondary | ICD-10-CM

## 2011-07-05 DIAGNOSIS — I10 Essential (primary) hypertension: Secondary | ICD-10-CM | POA: Insufficient documentation

## 2011-07-05 DIAGNOSIS — N390 Urinary tract infection, site not specified: Secondary | ICD-10-CM

## 2011-07-05 HISTORY — DX: Essential (primary) hypertension: I10

## 2011-07-05 HISTORY — DX: Gout, unspecified: M10.9

## 2011-07-05 LAB — URINALYSIS, ROUTINE W REFLEX MICROSCOPIC
Glucose, UA: NEGATIVE mg/dL
Ketones, ur: 15 mg/dL — AB
Nitrite: POSITIVE — AB
Protein, ur: 30 mg/dL — AB
Specific Gravity, Urine: 1.028 (ref 1.005–1.030)
Urobilinogen, UA: 1 mg/dL (ref 0.0–1.0)
pH: 5 (ref 5.0–8.0)

## 2011-07-05 LAB — COMPREHENSIVE METABOLIC PANEL
ALT: 13 U/L (ref 0–53)
AST: 16 U/L (ref 0–37)
Albumin: 3.1 g/dL — ABNORMAL LOW (ref 3.5–5.2)
Alkaline Phosphatase: 56 U/L (ref 39–117)
BUN: 28 mg/dL — ABNORMAL HIGH (ref 6–23)
CO2: 24 mEq/L (ref 19–32)
Calcium: 9.1 mg/dL (ref 8.4–10.5)
Chloride: 94 mEq/L — ABNORMAL LOW (ref 96–112)
Creatinine, Ser: 1.4 mg/dL — ABNORMAL HIGH (ref 0.50–1.35)
GFR calc Af Amer: 63 mL/min — ABNORMAL LOW (ref >90)
GFR calc non Af Amer: 54 mL/min — ABNORMAL LOW (ref >90)
Glucose, Bld: 106 mg/dL — ABNORMAL HIGH (ref 70–99)
Potassium: 4 mEq/L (ref 3.5–5.1)
Sodium: 129 mEq/L — ABNORMAL LOW (ref 135–145)
Total Bilirubin: 0.6 mg/dL (ref 0.3–1.2)
Total Protein: 7 g/dL (ref 6.0–8.3)

## 2011-07-05 LAB — CBC
HCT: 33 % — ABNORMAL LOW (ref 39.0–52.0)
Hemoglobin: 10.9 g/dL — ABNORMAL LOW (ref 13.0–17.0)
MCH: 23.6 pg — ABNORMAL LOW (ref 26.0–34.0)
MCHC: 33 g/dL (ref 30.0–36.0)
MCV: 71.6 fL — ABNORMAL LOW (ref 78.0–100.0)
Platelets: 289 10*3/uL (ref 150–400)
RBC: 4.61 MIL/uL (ref 4.22–5.81)
RDW: 19.5 % — ABNORMAL HIGH (ref 11.5–15.5)
WBC: 8.9 10*3/uL (ref 4.0–10.5)

## 2011-07-05 LAB — URINE MICROSCOPIC-ADD ON

## 2011-07-05 MED ORDER — SODIUM CHLORIDE 0.9 % IV BOLUS (SEPSIS)
1000.0000 mL | Freq: Once | INTRAVENOUS | Status: AC
  Administered 2011-07-05: 1000 mL via INTRAVENOUS

## 2011-07-05 MED ORDER — CEPHALEXIN 500 MG PO CAPS: 500.0000 mg | ORAL_CAPSULE | Freq: Four times a day (QID) | ORAL | Status: AC

## 2011-07-05 MED ORDER — DEXTROSE 5 % IV SOLN
1.0000 g | Freq: Once | INTRAVENOUS | Status: AC
  Administered 2011-07-05: 1 g via INTRAVENOUS
  Filled 2011-07-05: qty 10

## 2011-07-05 NOTE — Discharge Instructions (Signed)
Return to the ED with any concerns including fever, vomiting and not able to keep down liquids, abdominal pain, fainting, chest pain, decreased level of alertness/lethargy, or any other alarming symptoms

## 2011-07-05 NOTE — ED Notes (Signed)
One week history of fatigue and decreased appetite. Was placed on colchicine last week started having diarrhea this morning. States right ear feels stopped up and painful. Also off and on pains in head

## 2011-07-05 NOTE — ED Provider Notes (Signed)
History     CSN: [id number]  Arrival date & time 07/05/11  1622   First MD Initiated Contact with Patient 07/05/11 1710      Chief Complaint  Patient presents with  . Dysuria    hesitancy  . Otalgia  . Fatigue    (Consider location/radiation/quality/duration/timing/severity/associated sxs/prior treatment) HPI Pt c/o fatigue over the past several days, developed loose stool x 4 today.  No fever/chills.  No vomitnig.  Has had decreased appetite.  Pt was seen by his PMD and advised to come to ED to evaluate for UTI.  Pt denies abdominal pain.  No blood or mucous in stools.  No recent travel.  He denies dysuria.  There are no other alleviating or modifying factors, there are no other associated systemic symptoms Past Medical History  Diagnosis Date  . Varicose veins   . Diabetes mellitus   . Gout   . Hypertension   . Morbid obesity     Past Surgical History  Procedure Date  . Spine surgery     Family History  Problem Relation Age of Onset  . Hypertension Mother   . Cancer Father   . Hyperlipidemia Sister   . Hypertension Sister     History  Substance Use Topics  . Smoking status: Never Smoker   . Smokeless tobacco: Not on file  . Alcohol Use: No      Review of Systems ROS reviewed and all otherwise negative except for mentioned in HPI  Allergies  Lyrica  Home Medications   Current Outpatient Rx  Name Route Sig Dispense Refill  . ALLOPURINOL 100 MG PO TABS Oral Take 100 mg by mouth daily.    . ASPIRIN 81 MG PO TABS Oral Take 81 mg by mouth daily.    . COLCHICINE 0.6 MG PO TABS Oral Take 0.6 mg by mouth daily.    [name] DOCUSATE SODIUM 100 MG PO CAPS Oral Take 100 mg by mouth 2 (two) times daily. Patient uses this medication for stool softening.    . DULOXETINE HCL 60 MG PO CPEP Oral Take 60 mg by mouth daily.    . ENALAPRIL MALEATE 20 MG PO TABS Oral Take 20 mg by mouth daily.    [name] GABAPENTIN 300 MG PO CAPS Oral Take 300 mg by mouth 3 (three) times daily.      [name] GLIMEPIRIDE 4 MG PO TABS Oral Take 4 mg by mouth daily before breakfast.    . LANSOPRAZOLE 30 MG PO CPDR Oral Take 30 mg by mouth daily. Patient uses this medication for heartburn.    [name] METFORMIN HCL ER (OSM) 1000 MG PO TB24 Oral Take 1,000 mg by mouth daily with breakfast.    . OMEGA-3-ACID ETHYL ESTERS 1 G PO CAPS Oral Take 2 g by mouth 2 (two) times daily.    [name] SIMVASTATIN 20 MG PO TABS Oral Take 20 mg by mouth every evening.    . CEPHALEXIN 500 MG PO CAPS Oral Take 1 capsule (500 mg total) by mouth 4 (four) times daily. 28 capsule 0  . FUROSEMIDE 40 MG PO TABS Oral Take 40 mg by mouth daily.    . MOMETASONE FUROATE 50 MCG/ACT NA SUSP Nasal Place 2 sprays into the nose daily.    . TESTOSTERONE CYPIONATE 100 MG/ML IM OIL Intramuscular Inject 200 mg into the muscle every 14 (fourteen) days. For IM use only      BP 122/66  Pulse 87  Temp 99 F (37.2 C) (Oral)  Resp 20  SpO2 95% Vitals reviewed Physical Exam Physical Examination: General appearance - alert, obese, well appearing, and in no distress Mental status - alert, oriented to person, place, and time Eyes - pupils equal and reactive, no scleral icterus, no conjunctival injection Mouth - mucous membranes moist, pharynx normal without lesions Chest - clear to auscultation, no wheezes, rales or rhonchi, symmetric air entry Heart - normal rate, regular rhythm, normal S1, S2, no murmurs, rubs, clicks or gallops Abdomen - soft, nontender, nondistended, no masses or organomegaly, nabs Musculoskeletal - no joint tenderness, deformity or swelling Extremities - peripheral pulses normal, no pedal edema, no clubbing or cyanosis Skin - normal coloration and turgor, no rashes  ED Course  Procedures (including critical care time)  6:56 PM renal insufficiency- creat decreased from 1.48 on labs performed by PMD 2 days ago, BUN stable at 28.    Labs Reviewed  URINALYSIS, ROUTINE W REFLEX MICROSCOPIC - Abnormal; Notable for the following:     Color, Urine ORANGE (*)  BIOCHEMICALS MAY BE AFFECTED BY COLOR   APPearance CLOUDY (*)     Hgb urine dipstick TRACE (*)     Bilirubin Urine SMALL (*)     Ketones, ur 15 (*)     Protein, ur 30 (*)     Nitrite POSITIVE (*)     Leukocytes, UA LARGE (*)     All other components within normal limits  CBC - Abnormal; Notable for the following:    Hemoglobin 10.9 (*)     HCT 33.0 (*)     MCV 71.6 (*)     MCH 23.6 (*)     RDW 19.5 (*)     All other components within normal limits  COMPREHENSIVE METABOLIC PANEL - Abnormal; Notable for the following:    Sodium 129 (*)     Chloride 94 (*)     Glucose, Bld 106 (*)     BUN 28 (*)     Creatinine, Ser 1.40 (*)     Albumin 3.1 (*)     GFR calc non Af Amer 54 (*)     GFR calc Af Amer 63 (*)     All other components within normal limits  URINE MICROSCOPIC-ADD ON - Abnormal; Notable for the following:    Squamous Epithelial / LPF FEW (*)     Bacteria, UA MANY (*)     All other components within normal limits   No results found.   1. Urinary tract infection   2. Hyponatremia   3. Renal insufficiency       MDM  Pt with generalized fatigue and loose stools- with UTI here on ED workup.  Pt with normal vitals signs- low suspicion for sepsis/septic shock at this time.  Pt started on abx in the Ed, discharged with strict return precautions.  He is to arrange for follow up with his PMD.  Pt agreeable with the plan for discharge.         Ethelda Chick, MD 07/08/11 0000

## 2011-08-03 ENCOUNTER — Emergency Department (HOSPITAL_BASED_OUTPATIENT_CLINIC_OR_DEPARTMENT_OTHER): Payer: BC Managed Care – PPO

## 2011-08-03 ENCOUNTER — Observation Stay (HOSPITAL_BASED_OUTPATIENT_CLINIC_OR_DEPARTMENT_OTHER)
Admission: EM | Admit: 2011-08-03 | Discharge: 2011-08-05 | DRG: 099 | Disposition: A | Discharge status: Home / Self Care | Payer: BC Managed Care – PPO | Attending: Internal Medicine | Admitting: Internal Medicine

## 2011-08-03 ENCOUNTER — Encounter (HOSPITAL_BASED_OUTPATIENT_CLINIC_OR_DEPARTMENT_OTHER): Payer: Self-pay | Admitting: *Deleted

## 2011-08-03 DIAGNOSIS — Z79899 Other long term (current) drug therapy: Secondary | ICD-10-CM

## 2011-08-03 DIAGNOSIS — G4733 Obstructive sleep apnea (adult) (pediatric): Secondary | ICD-10-CM | POA: Diagnosis present

## 2011-08-03 DIAGNOSIS — R000 Tachycardia, unspecified: Secondary | ICD-10-CM

## 2011-08-03 DIAGNOSIS — M109 Gout, unspecified: Secondary | ICD-10-CM | POA: Diagnosis present

## 2011-08-03 DIAGNOSIS — R0609 Other forms of dyspnea: Principal | ICD-10-CM | POA: Diagnosis present

## 2011-08-03 DIAGNOSIS — R0600 Dyspnea, unspecified: Secondary | ICD-10-CM

## 2011-08-03 DIAGNOSIS — E662 Morbid (severe) obesity with alveolar hypoventilation: Secondary | ICD-10-CM | POA: Diagnosis present

## 2011-08-03 DIAGNOSIS — K219 Gastro-esophageal reflux disease without esophagitis: Secondary | ICD-10-CM | POA: Diagnosis present

## 2011-08-03 DIAGNOSIS — N182 Chronic kidney disease, stage 2 (mild): Secondary | ICD-10-CM | POA: Diagnosis present

## 2011-08-03 DIAGNOSIS — E1122 Type 2 diabetes mellitus with diabetic chronic kidney disease: Secondary | ICD-10-CM | POA: Diagnosis present

## 2011-08-03 DIAGNOSIS — Z7982 Long term (current) use of aspirin: Secondary | ICD-10-CM

## 2011-08-03 DIAGNOSIS — Z9989 Dependence on other enabling machines and devices: Secondary | ICD-10-CM

## 2011-08-03 DIAGNOSIS — Z6841 Body Mass Index (BMI) 40.0 and over, adult: Secondary | ICD-10-CM

## 2011-08-03 DIAGNOSIS — E1129 Type 2 diabetes mellitus with other diabetic kidney complication: Secondary | ICD-10-CM | POA: Diagnosis present

## 2011-08-03 DIAGNOSIS — N390 Urinary tract infection, site not specified: Secondary | ICD-10-CM

## 2011-08-03 DIAGNOSIS — E119 Type 2 diabetes mellitus without complications: Secondary | ICD-10-CM | POA: Diagnosis present

## 2011-08-03 DIAGNOSIS — R0989 Other specified symptoms and signs involving the circulatory and respiratory systems: Principal | ICD-10-CM | POA: Diagnosis present

## 2011-08-03 HISTORY — DX: Adverse effect of unspecified anesthetic, initial encounter: T41.45XA

## 2011-08-03 HISTORY — DX: Type 2 diabetes mellitus without complications: E11.9

## 2011-08-03 HISTORY — DX: Gastro-esophageal reflux disease without esophagitis: K21.9

## 2011-08-03 HISTORY — PX: TRANSTHORACIC ECHOCARDIOGRAM: SHX275

## 2011-08-03 HISTORY — DX: Other complications of anesthesia, initial encounter: T88.59XA

## 2011-08-03 HISTORY — DX: Unspecified osteoarthritis, unspecified site: M19.90

## 2011-08-03 HISTORY — DX: Polyneuropathy, unspecified: G62.9

## 2011-08-03 LAB — COMPREHENSIVE METABOLIC PANEL
ALT: 19 U/L (ref 0–53)
AST: 16 U/L (ref 0–37)
Albumin: 3.6 g/dL (ref 3.5–5.2)
Alkaline Phosphatase: 48 U/L (ref 39–117)
BUN: 14 mg/dL (ref 6–23)
CO2: 23 mEq/L (ref 19–32)
Calcium: 9.6 mg/dL (ref 8.4–10.5)
Chloride: 101 mEq/L (ref 96–112)
Creatinine, Ser: 1.1 mg/dL (ref 0.50–1.35)
GFR calc Af Amer: 84 mL/min — ABNORMAL LOW (ref >90)
GFR calc non Af Amer: 72 mL/min — ABNORMAL LOW (ref >90)
Glucose, Bld: 99 mg/dL (ref 70–99)
Potassium: 4.3 mEq/L (ref 3.5–5.1)
Sodium: 137 mEq/L (ref 135–145)
Total Bilirubin: 0.4 mg/dL (ref 0.3–1.2)
Total Protein: 7.3 g/dL (ref 6.0–8.3)

## 2011-08-03 LAB — CBC WITH DIFFERENTIAL/PLATELET
Basophils Absolute: 0.1 10*3/uL (ref 0.0–0.1)
Basophils Relative: 1 % (ref 0–1)
Eosinophils Absolute: 0.5 10*3/uL (ref 0.0–0.7)
Eosinophils Relative: 4 % (ref 0–5)
HCT: 37.9 % — ABNORMAL LOW (ref 39.0–52.0)
Hemoglobin: 12.2 g/dL — ABNORMAL LOW (ref 13.0–17.0)
Lymphocytes Relative: 15 % (ref 12–46)
Lymphs Abs: 1.7 10*3/uL (ref 0.7–4.0)
MCH: 23.6 pg — ABNORMAL LOW (ref 26.0–34.0)
MCHC: 32.2 g/dL (ref 30.0–36.0)
MCV: 73.3 fL — ABNORMAL LOW (ref 78.0–100.0)
Monocytes Absolute: 1.5 10*3/uL — ABNORMAL HIGH (ref 0.1–1.0)
Monocytes Relative: 13 % — ABNORMAL HIGH (ref 3–12)
Neutro Abs: 7.6 10*3/uL (ref 1.7–7.7)
Neutrophils Relative %: 67 % (ref 43–77)
Platelets: 276 10*3/uL (ref 150–400)
RBC: 5.17 MIL/uL (ref 4.22–5.81)
RDW: 23 % — ABNORMAL HIGH (ref 11.5–15.5)
WBC: 11.4 10*3/uL — ABNORMAL HIGH (ref 4.0–10.5)

## 2011-08-03 LAB — POCT I-STAT 3, ART BLOOD GAS (G3+)
Acid-base deficit: 1 mmol/L (ref 0.0–2.0)
Bicarbonate: 23.6 mEq/L (ref 20.0–24.0)
O2 Saturation: 95 %
Patient temperature: 98
TCO2: 25 mmol/L (ref 0–100)
pCO2 arterial: 36.3 mmHg (ref 35.0–45.0)
pH, Arterial: 7.421 (ref 7.350–7.450)
pO2, Arterial: 72 mmHg — ABNORMAL LOW (ref 80.0–100.0)

## 2011-08-03 LAB — URINE MICROSCOPIC-ADD ON

## 2011-08-03 LAB — URINALYSIS, ROUTINE W REFLEX MICROSCOPIC
Glucose, UA: NEGATIVE mg/dL
Hgb urine dipstick: NEGATIVE
Ketones, ur: 15 mg/dL — AB
Nitrite: NEGATIVE
Protein, ur: 100 mg/dL — AB
Specific Gravity, Urine: 1.028 (ref 1.005–1.030)
Urobilinogen, UA: 1 mg/dL (ref 0.0–1.0)
pH: 6 (ref 5.0–8.0)

## 2011-08-03 LAB — OCCULT BLOOD X 1 CARD TO LAB, STOOL: Fecal Occult Bld: NEGATIVE

## 2011-08-03 LAB — CBC
HCT: 37 % — ABNORMAL LOW (ref 39.0–52.0)
Hemoglobin: 11.7 g/dL — ABNORMAL LOW (ref 13.0–17.0)
MCH: 23.7 pg — ABNORMAL LOW (ref 26.0–34.0)
MCHC: 31.6 g/dL (ref 30.0–36.0)
MCV: 74.9 fL — ABNORMAL LOW (ref 78.0–100.0)
Platelets: 252 10*3/uL (ref 150–400)
RBC: 4.94 MIL/uL (ref 4.22–5.81)
RDW: 21.6 % — ABNORMAL HIGH (ref 11.5–15.5)
WBC: 12.9 10*3/uL — ABNORMAL HIGH (ref 4.0–10.5)

## 2011-08-03 LAB — D-DIMER, QUANTITATIVE: D-Dimer, Quant: 0.81 ug/mL-FEU — ABNORMAL HIGH (ref 0.00–0.48)

## 2011-08-03 LAB — PROTIME-INR
INR: 0.96 (ref 0.00–1.49)
Prothrombin Time: 13 seconds (ref 11.6–15.2)

## 2011-08-03 LAB — TROPONIN I: Troponin I: <0.3 ng/mL (ref <0.30)

## 2011-08-03 MED ORDER — ONDANSETRON HCL 4 MG PO TABS: 4.0000 mg | ORAL_TABLET | Freq: Four times a day (QID) | ORAL | Status: DC | PRN

## 2011-08-03 MED ORDER — SODIUM CHLORIDE 0.9 % IV SOLN: 250.0000 mL | INTRAVENOUS | Status: DC | PRN

## 2011-08-03 MED ORDER — DEXTROSE 5 % IV SOLN
1.0000 g | Freq: Once | INTRAVENOUS | Status: AC
  Administered 2011-08-03: 1 g via INTRAVENOUS
  Filled 2011-08-03: qty 10

## 2011-08-03 MED ORDER — DULOXETINE HCL 60 MG PO CPEP
60.0000 mg | ORAL_CAPSULE | Freq: Every day | ORAL | Status: DC
  Administered 2011-08-04: 60 mg via ORAL
  Filled 2011-08-03 (×2): qty 1

## 2011-08-03 MED ORDER — ACETAMINOPHEN 650 MG RE SUPP: 650.0000 mg | Freq: Four times a day (QID) | RECTAL | Status: DC | PRN

## 2011-08-03 MED ORDER — ALBUTEROL SULFATE (5 MG/ML) 0.5% IN NEBU
2.5000 mg | INHALATION_SOLUTION | Freq: Four times a day (QID) | RESPIRATORY_TRACT | Status: DC
  Administered 2011-08-03 – 2011-08-04 (×3): 2.5 mg via RESPIRATORY_TRACT
  Filled 2011-08-03 (×3): qty 0.5

## 2011-08-03 MED ORDER — IPRATROPIUM BROMIDE 0.02 % IN SOLN
0.5000 mg | Freq: Four times a day (QID) | RESPIRATORY_TRACT | Status: DC
  Administered 2011-08-03 – 2011-08-04 (×2): 0.5 mg via RESPIRATORY_TRACT
  Filled 2011-08-03 (×2): qty 2.5

## 2011-08-03 MED ORDER — ENOXAPARIN SODIUM 40 MG/0.4ML ~~LOC~~ SOLN
40.0000 mg | SUBCUTANEOUS | Status: DC
  Filled 2011-08-03 (×2): qty 0.4

## 2011-08-03 MED ORDER — ASPIRIN 81 MG PO CHEW
81.0000 mg | CHEWABLE_TABLET | Freq: Every day | ORAL | Status: DC
  Administered 2011-08-04 – 2011-08-05 (×2): 81 mg via ORAL
  Filled 2011-08-03: qty 1

## 2011-08-03 MED ORDER — HYDROCODONE-ACETAMINOPHEN 5-325 MG PO TABS
1.0000 | ORAL_TABLET | ORAL | Status: DC | PRN
  Filled 2011-08-03: qty 2

## 2011-08-03 MED ORDER — SODIUM CHLORIDE 0.9 % IV BOLUS (SEPSIS)
500.0000 mL | Freq: Once | INTRAVENOUS | Status: AC
  Administered 2011-08-03: 18:00:00 via INTRAVENOUS

## 2011-08-03 MED ORDER — GABAPENTIN 300 MG PO CAPS
300.0000 mg | ORAL_CAPSULE | Freq: Two times a day (BID) | ORAL | Status: DC
  Administered 2011-08-03 – 2011-08-05 (×4): 300 mg via ORAL
  Filled 2011-08-03 (×5): qty 1

## 2011-08-03 MED ORDER — COLCHICINE 0.6 MG PO TABS
0.6000 mg | ORAL_TABLET | Freq: Two times a day (BID) | ORAL | Status: DC
  Administered 2011-08-03 – 2011-08-05 (×4): 0.6 mg via ORAL
  Filled 2011-08-03 (×5): qty 1

## 2011-08-03 MED ORDER — MAGNESIUM OXIDE 400 (241.3 MG) MG PO TABS
400.0000 mg | ORAL_TABLET | Freq: Every day | ORAL | Status: DC
  Administered 2011-08-04 – 2011-08-05 (×2): 400 mg via ORAL
  Filled 2011-08-03 (×2): qty 1

## 2011-08-03 MED ORDER — ALLOPURINOL 100 MG PO TABS
100.0000 mg | ORAL_TABLET | Freq: Every day | ORAL | Status: DC
  Administered 2011-08-04 – 2011-08-05 (×2): 100 mg via ORAL
  Filled 2011-08-03 (×2): qty 1

## 2011-08-03 MED ORDER — ACETAMINOPHEN 325 MG PO TABS: 650.0000 mg | ORAL_TABLET | Freq: Four times a day (QID) | ORAL | Status: DC | PRN

## 2011-08-03 MED ORDER — ONDANSETRON HCL 4 MG/2ML IJ SOLN: 4.0000 mg | Freq: Four times a day (QID) | INTRAMUSCULAR | Status: DC | PRN

## 2011-08-03 MED ORDER — ENOXAPARIN SODIUM 100 MG/ML ~~LOC~~ SOLN
100.0000 mg | Freq: Once | SUBCUTANEOUS | Status: AC
  Administered 2011-08-03: 100 mg via SUBCUTANEOUS
  Filled 2011-08-03: qty 1

## 2011-08-03 MED ORDER — ASPIRIN 81 MG PO TABS: 81.0000 mg | ORAL_TABLET | Freq: Every day | ORAL | Status: DC

## 2011-08-03 MED ORDER — ALBUTEROL SULFATE (5 MG/ML) 0.5% IN NEBU: 2.5000 mg | INHALATION_SOLUTION | RESPIRATORY_TRACT | Status: DC | PRN

## 2011-08-03 MED ORDER — MORPHINE SULFATE 2 MG/ML IJ SOLN: 1.0000 mg | INTRAMUSCULAR | Status: DC | PRN

## 2011-08-03 MED ORDER — OMEGA-3-ACID ETHYL ESTERS 1 G PO CAPS
2.0000 g | ORAL_CAPSULE | Freq: Two times a day (BID) | ORAL | Status: DC
  Administered 2011-08-03 – 2011-08-05 (×4): 2 g via ORAL
  Filled 2011-08-03 (×5): qty 2

## 2011-08-03 MED ORDER — FUROSEMIDE 20 MG PO TABS
20.0000 mg | ORAL_TABLET | ORAL | Status: DC
  Administered 2011-08-04: 20 mg via ORAL
  Filled 2011-08-03: qty 1

## 2011-08-03 MED ORDER — DOCUSATE SODIUM 100 MG PO CAPS
100.0000 mg | ORAL_CAPSULE | Freq: Every day | ORAL | Status: DC
  Administered 2011-08-04: 100 mg via ORAL
  Filled 2011-08-03 (×2): qty 1

## 2011-08-03 MED ORDER — SODIUM CHLORIDE 0.9 % IJ SOLN: 3.0000 mL | INTRAMUSCULAR | Status: DC | PRN

## 2011-08-03 MED ORDER — SODIUM CHLORIDE 0.9 % IJ SOLN
3.0000 mL | Freq: Two times a day (BID) | INTRAMUSCULAR | Status: DC
  Administered 2011-08-03 – 2011-08-05 (×2): 3 mL via INTRAVENOUS

## 2011-08-03 MED ORDER — SIMVASTATIN 20 MG PO TABS
20.0000 mg | ORAL_TABLET | Freq: Every evening | ORAL | Status: DC
  Administered 2011-08-04: 20 mg via ORAL
  Filled 2011-08-03 (×2): qty 1

## 2011-08-03 MED ORDER — SODIUM CHLORIDE 0.9 % IJ SOLN
3.0000 mL | Freq: Two times a day (BID) | INTRAMUSCULAR | Status: DC
  Administered 2011-08-04 (×2): 3 mL via INTRAVENOUS

## 2011-08-03 MED ORDER — ASPIRIN 81 MG PO CHEW
324.0000 mg | CHEWABLE_TABLET | Freq: Once | ORAL | Status: AC
  Administered 2011-08-03: 324 mg via ORAL
  Filled 2011-08-03: qty 4

## 2011-08-03 MED ORDER — ENALAPRIL MALEATE 20 MG PO TABS
20.0000 mg | ORAL_TABLET | Freq: Two times a day (BID) | ORAL | Status: DC
  Administered 2011-08-03 – 2011-08-05 (×4): 20 mg via ORAL
  Filled 2011-08-03 (×5): qty 1

## 2011-08-03 MED ORDER — PANTOPRAZOLE SODIUM 20 MG PO TBEC
20.0000 mg | DELAYED_RELEASE_TABLET | Freq: Every day | ORAL | Status: DC
  Administered 2011-08-04 – 2011-08-05 (×2): 20 mg via ORAL
  Filled 2011-08-03 (×3): qty 1

## 2011-08-03 MED ORDER — IOHEXOL 350 MG/ML SOLN
80.0000 mL | Freq: Once | INTRAVENOUS | Status: AC | PRN
  Administered 2011-08-03: 80 mL via INTRAVENOUS

## 2011-08-03 MED ORDER — GLIMEPIRIDE 4 MG PO TABS
4.0000 mg | ORAL_TABLET | Freq: Every day | ORAL | Status: DC
  Administered 2011-08-04: 4 mg via ORAL
  Filled 2011-08-03 (×2): qty 1

## 2011-08-03 NOTE — ED Notes (Addendum)
Pt reports SOB x2 months, worsens with speaking and exertion. Has worsened in the past 2 weeks, he states he has a cardiologist appointment at the end of August but could not wait. Also reports diaphoretic episodes that occur at rest and do not appear to be associated with anything.

## 2011-08-03 NOTE — ED Notes (Signed)
Unable to give report due to shift change. Will call at 1930 as directed.

## 2011-08-03 NOTE — ED Notes (Signed)
Sob x 2 months. States he has a referral to a cardiologist for next month. Blood work and test have been negative. Was treated for a UTI a few weeks ago. He has an appointment with a urologist next week for continued urinary problems.

## 2011-08-03 NOTE — ED Notes (Signed)
Patient transported to X-ray. Pt stable and ready for transport. 

## 2011-08-03 NOTE — ED Provider Notes (Signed)
History     CSN: [id number]  Arrival date & time 08/03/11  1427   First MD Initiated Contact with Patient 08/03/11 1505      No chief complaint on file.   (Consider location/radiation/quality/duration/timing/severity/associated sxs/prior treatment) HPI Patient complaining of increased fatigue for two months, now with dyspnea for two weeks.  Dyspnea increasing, increases with exertion, subjective fever last night, episodic chest pressure off and on for 1-2 years almost daily, some increase last 2 days- reports stress test by Dr. [place] 1-2 years and ok.  Denies cough, orthopnea, increased peripheral edema or leg pain.  Patient also with complaints of urinary frequency.  Patient seen here in June and had uti, treated with rocephin and cipro, but symptoms worse now for two weeks.  Past Medical History  Diagnosis Date  . Varicose veins   . Diabetes mellitus   . Gout   . Hypertension   . Morbid obesity     Past Surgical History  Procedure Date  . Spine surgery     Family History  Problem Relation Age of Onset  . Hypertension Mother   . Cancer Father   . Hyperlipidemia Sister   . Hypertension Sister     History  Substance Use Topics  . Smoking status: Never Smoker   . Smokeless tobacco: Not on file  . Alcohol Use: No      Review of Systems  Genitourinary: Positive for frequency.       Patient quit taking lasix two weeks ago.    All other systems reviewed and are negative.    Allergies  Lyrica  Home Medications   Current Outpatient Rx  Name Route Sig Dispense Refill  . IRON PO Oral Take by mouth.    . ALLOPURINOL 100 MG PO TABS Oral Take 100 mg by mouth daily.    . ASPIRIN 81 MG PO TABS Oral Take 81 mg by mouth daily.    . COLCHICINE 0.6 MG PO TABS Oral Take 0.6 mg by mouth daily.    [name] DOCUSATE SODIUM 100 MG PO CAPS Oral Take 100 mg by mouth 2 (two) times daily. Patient uses this medication for stool softening.    . DULOXETINE HCL 60 MG PO CPEP Oral Take 60  mg by mouth daily.    . ENALAPRIL MALEATE 20 MG PO TABS Oral Take 20 mg by mouth daily.    . FUROSEMIDE 40 MG PO TABS Oral Take 40 mg by mouth daily.    [name] GABAPENTIN 300 MG PO CAPS Oral Take 300 mg by mouth 3 (three) times daily.    [name] GLIMEPIRIDE 4 MG PO TABS Oral Take 4 mg by mouth daily before breakfast.    . LANSOPRAZOLE 30 MG PO CPDR Oral Take 30 mg by mouth daily. Patient uses this medication for heartburn.    [name] METFORMIN HCL ER (OSM) 1000 MG PO TB24 Oral Take 1,000 mg by mouth daily with breakfast.    . MOMETASONE FUROATE 50 MCG/ACT NA SUSP Nasal Place 2 sprays into the nose daily.    . OMEGA-3-ACID ETHYL ESTERS 1 G PO CAPS Oral Take 2 g by mouth 2 (two) times daily.    [name] SIMVASTATIN 20 MG PO TABS Oral Take 20 mg by mouth every evening.    . TESTOSTERONE CYPIONATE 100 MG/ML IM OIL Intramuscular Inject 200 mg into the muscle every 14 (fourteen) days. For IM use only      BP 118/77  Pulse 116  Temp 98 F (36.7 C) (  Oral)  Resp 24  SpO2 98%  Physical Exam  ED Course  Procedures (including critical care time)  Labs Reviewed - No data to display No results found.   No diagnosis found.  Date: 08/03/2011  Rate: 107  Rhythm: sinus tachycardia  QRS Axis: left  Intervals: normal  ST/T Wave abnormalities: nonspecific ST/T changes  Conduction Disutrbances:incomplete rbbb  Narrative Interpretation:   Old EKG Reviewed: changes noted     MDM   56 y.o. Male appears older than stated age complaining of dyspnea and urinary frequency.  Patient tachycardiac here but without acutely ischemic changes on ekg, acute cxr changes, or hypoxia on abg.  Patient does have elevated d-dimer but has renal insufficiency and will be anticoagulated with lovenox pending further assessment with vq scan.   Urine with uti and will be cultured, rocephin given.   Discussed with Dr.G. Rito Ehrlich and will proceed with ct angio as patient with normal creatinine today.       Hilario Quarry, MD 08/03/11  [phone]

## 2011-08-03 NOTE — ED Notes (Signed)
Dr Ray at bedside. 

## 2011-08-03 NOTE — Progress Notes (Signed)
Patient setup on CPAP 7cm H20, medium nasal mask. Patient does wear at home. Was unable to recall settings as home. No O2 being used at this time.

## 2011-08-04 DIAGNOSIS — E662 Morbid (severe) obesity with alveolar hypoventilation: Secondary | ICD-10-CM | POA: Diagnosis present

## 2011-08-04 DIAGNOSIS — R0609 Other forms of dyspnea: Secondary | ICD-10-CM

## 2011-08-04 DIAGNOSIS — N182 Chronic kidney disease, stage 2 (mild): Secondary | ICD-10-CM

## 2011-08-04 DIAGNOSIS — G4733 Obstructive sleep apnea (adult) (pediatric): Secondary | ICD-10-CM

## 2011-08-04 DIAGNOSIS — N390 Urinary tract infection, site not specified: Secondary | ICD-10-CM

## 2011-08-04 DIAGNOSIS — R0989 Other specified symptoms and signs involving the circulatory and respiratory systems: Principal | ICD-10-CM

## 2011-08-04 DIAGNOSIS — I517 Cardiomegaly: Secondary | ICD-10-CM

## 2011-08-04 LAB — COMPREHENSIVE METABOLIC PANEL
ALT: 20 U/L (ref 0–53)
AST: 18 U/L (ref 0–37)
Albumin: 3.4 g/dL — ABNORMAL LOW (ref 3.5–5.2)
Alkaline Phosphatase: 42 U/L (ref 39–117)
BUN: 14 mg/dL (ref 6–23)
CO2: 21 mEq/L (ref 19–32)
Calcium: 9.3 mg/dL (ref 8.4–10.5)
Chloride: 101 mEq/L (ref 96–112)
Creatinine, Ser: 1.03 mg/dL (ref 0.50–1.35)
GFR calc Af Amer: >90 mL/min (ref >90)
GFR calc non Af Amer: 78 mL/min — ABNORMAL LOW (ref >90)
Glucose, Bld: 118 mg/dL — ABNORMAL HIGH (ref 70–99)
Potassium: 4 mEq/L (ref 3.5–5.1)
Sodium: 138 mEq/L (ref 135–145)
Total Bilirubin: 0.5 mg/dL (ref 0.3–1.2)
Total Protein: 7 g/dL (ref 6.0–8.3)

## 2011-08-04 LAB — CBC
HCT: 36.4 % — ABNORMAL LOW (ref 39.0–52.0)
Hemoglobin: 11.4 g/dL — ABNORMAL LOW (ref 13.0–17.0)
MCH: 23.4 pg — ABNORMAL LOW (ref 26.0–34.0)
MCHC: 31.3 g/dL (ref 30.0–36.0)
MCV: 74.7 fL — ABNORMAL LOW (ref 78.0–100.0)
Platelets: 239 10*3/uL (ref 150–400)
RBC: 4.87 MIL/uL (ref 4.22–5.81)
RDW: 21.5 % — ABNORMAL HIGH (ref 11.5–15.5)
WBC: 11.3 10*3/uL — ABNORMAL HIGH (ref 4.0–10.5)

## 2011-08-04 LAB — CARDIAC PANEL(CRET KIN+CKTOT+MB+TROPI)
CK, MB: 1.9 ng/mL (ref 0.3–4.0)
CK, MB: 1.9 ng/mL (ref 0.3–4.0)
CK, MB: 2.1 ng/mL (ref 0.3–4.0)
Relative Index: INVALID (ref 0.0–2.5)
Relative Index: INVALID (ref 0.0–2.5)
Relative Index: INVALID (ref 0.0–2.5)
Total CK: 50 U/L (ref 7–232)
Total CK: 56 U/L (ref 7–232)
Total CK: 60 U/L (ref 7–232)
Troponin I: <0.3 ng/mL (ref <0.30)
Troponin I: <0.3 ng/mL (ref <0.30)
Troponin I: <0.3 ng/mL (ref <0.30)

## 2011-08-04 LAB — HEMOGLOBIN A1C
Hgb A1c MFr Bld: 6.1 % — ABNORMAL HIGH (ref <5.7)
Mean Plasma Glucose: 128 mg/dL — ABNORMAL HIGH (ref <117)

## 2011-08-04 LAB — PRO B NATRIURETIC PEPTIDE: Pro B Natriuretic peptide (BNP): 26.9 pg/mL (ref 0–125)

## 2011-08-04 LAB — GLUCOSE, CAPILLARY
Glucose-Capillary: 128 mg/dL — ABNORMAL HIGH (ref 70–99)
Glucose-Capillary: 114 mg/dL — ABNORMAL HIGH (ref 70–99)
Glucose-Capillary: 119 mg/dL — ABNORMAL HIGH (ref 70–99)

## 2011-08-04 LAB — CREATININE, SERUM
Creatinine, Ser: 1.14 mg/dL (ref 0.50–1.35)
GFR calc Af Amer: 80 mL/min — ABNORMAL LOW (ref >90)
GFR calc non Af Amer: 69 mL/min — ABNORMAL LOW (ref >90)

## 2011-08-04 LAB — TSH: TSH: 3.066 u[IU]/mL (ref 0.350–4.500)

## 2011-08-04 LAB — PHOSPHORUS: Phosphorus: 2.8 mg/dL (ref 2.3–4.6)

## 2011-08-04 MED ORDER — CEFPODOXIME PROXETIL 200 MG PO TABS
200.0000 mg | ORAL_TABLET | Freq: Two times a day (BID) | ORAL | Status: DC
  Administered 2011-08-04 – 2011-08-05 (×2): 200 mg via ORAL
  Filled 2011-08-04 (×4): qty 1

## 2011-08-04 MED ORDER — ENOXAPARIN SODIUM 80 MG/0.8ML ~~LOC~~ SOLN
80.0000 mg | SUBCUTANEOUS | Status: DC
  Filled 2011-08-04: qty 0.8

## 2011-08-04 MED ORDER — INSULIN ASPART 100 UNIT/ML ~~LOC~~ SOLN: 0.0000 [IU] | Freq: Three times a day (TID) | SUBCUTANEOUS | Status: DC

## 2011-08-04 MED ORDER — INSULIN ASPART 100 UNIT/ML ~~LOC~~ SOLN: 0.0000 [IU] | Freq: Every day | SUBCUTANEOUS | Status: DC

## 2011-08-04 NOTE — Progress Notes (Signed)
  Echocardiogram 2D Echocardiogram has been performed.  [place]ian Co 08/04/2011, 9:37 AM

## 2011-08-04 NOTE — Care Management Note (Unsigned)
    Page 1 of 1   08/04/2011     1:35:43 PM   CARE MANAGEMENT NOTE 08/04/2011  Patient:  Jason Wyatt, Jason Wyatt   Account Number:  [id number]  Date Initiated:  08/04/2011  Documentation initiated by:  SIMMONS,Janellie Tennison  Subjective/Objective Assessment:   ADMITTED WITH SOB; LIVES AT HOME WITH FAMILY; WAS IPTA; IN COLONOSCOPY PRESENTLY.     Action/Plan:   DISCHARGE PLANNING.   Anticipated DC Date:  08/05/2011   Anticipated DC Plan:  HOME/SELF CARE      DC Planning Services  CM consult      Choice offered to / List presented to:             Status of service:  In process, will continue to follow Medicare Important Message given?   (If response is "NO", the following Medicare IM given date fields will be blank) Date Medicare IM given:   Date Additional Medicare IM given:    Discharge Disposition:    Per UR Regulation:  Reviewed for med. necessity/level of care/duration of stay  If discussed at Long Length of Stay Meetings, dates discussed:    Comments:  08/04/11  1333  Analiya Porco SIMMONS RN, BSN [phone] NCM WILL FOLLOW.

## 2011-08-04 NOTE — Progress Notes (Signed)
ANTICOAGULATION CONSULT NOTE - Initial Consult  Pharmacy Consult for Lovenox Indication: VTE prophylaxis   Assessment: 6 YOM admitted with SOB (CT Angio negative for PE) to start Lovenox for VTE prophylaxis. CrCl > 100. Wt 162.3 kg  (BMI >30). Lovenox 100mg  was given in ED at 17:32. Currently on Lovenox 40mg  SQ q24h. CBC ok.   Goal of Therapy:  Xa level 0.3 to 0.6 if needed Monitor platelets by anticoagulation protocol: Yes   Plan:  1. Increase Lovenox dose to 0.5 mg/kg (80mg ) SQ q24h for BMI >30 and CrCl >100. Will start tomorrow as received 100mg  today.  2. Follow-up renal function and CBC for any further needs for adjustment.  3. Consider checking Xa level if renal function changing to ensure correct dosing.   Allergies  Allergen Reactions  . Lyrica (Pregabalin) Itching and Rash    Patient Measurements: Height: 6\' 2"  (188 cm) Weight: 357 lb 11.2 oz (162.252 kg) IBW/kg (Calculated) : 82.2   Labs:  Basename 08/04/11 0705 08/03/11 2308 08/03/11 2306 08/03/11 1600  HGB 11.4* 11.7* -- --  HCT 36.4* 37.0* -- 37.9*  PLT 239 252 -- 276  APTT -- -- -- --  LABPROT -- -- -- 13.0  INR -- -- -- 0.96  HEPARINUNFRC -- -- -- --  CREATININE 1.03 1.14 -- 1.10  CKTOTAL 56 -- 50 --  CKMB 1.9 -- 1.9 --  TROPONINI <0.30 -- <0.30 <0.30   Estimated Creatinine Clearance: 126.3 ml/min (by C-G formula based on Cr of 1.03).    Link Snuffer, PharmD, BCPS Clinical Pharmacist [phone] 08/04/2011,3:29 PM

## 2011-08-04 NOTE — H&P (Signed)
Jason Wyatt is an 56 y.o. male.   Chief Complaint: Shortness of breath with exertion HPI: A 56 year old morbidly obese man with multiple medical problems including obstructive sleep apnea on CPAP. [facility] was complaining of exertional dyspnea. Symptoms have been going on for 2 months but have gotten worse in the last 2 weeks. He has not had any prior history of CHF. No smoking history and no history of COPD. He denied any chest pain no PND no orthopnea. Patient has noted occasional swelling of his legs bilaterally which comes and goes. He denied any recent GI bleed a viral illness. He has noted that his shortness of breath gets better when he rests and still gets worse with any exertion. No fever, no cough.  Past Medical History  Diagnosis Date  . Varicose veins   . Gout   . Hypertension   . Morbid obesity   . Complication of anesthesia 06/2010    "woke up next day w/ventilator on"  . High cholesterol   . Peripheral neuropathy   . Exertional dyspnea 08/03/11  . Sleep apnea     "use CPAP"  . Type II diabetes mellitus   . GERD (gastroesophageal reflux disease)   . Arthritis     "in my back"  . Chronic UTI (urinary tract infection) 08/03/11    "last month or so"    Past Surgical History  Procedure Date  . Posterior fusion cervical spine 06/2010    "put rods and screws in"  . Uvulopalatopharyngoplasty, tonsillectomy and septoplasty 1990's  . Tonsillectomy and adenoidectomy 1990's    "for sleep apnea"    Family History  Problem Relation Age of Onset  . Hypertension Mother   . Cancer Father   . Hyperlipidemia Sister   . Hypertension Sister    Social History:  reports that he has never smoked. He has never used smokeless tobacco. He reports that he does not drink alcohol or use illicit drugs.  Allergies:  Allergies  Allergen Reactions  . Lyrica (Pregabalin) Itching and Rash    Medications Prior to Admission  Medication Sig Dispense Refill  .  allopurinol (ZYLOPRIM) 100 MG tablet Take 100 mg by mouth daily.      [name] aspirin 81 MG tablet Take 81 mg by mouth daily.      . colchicine 0.6 MG tablet Take 0.6 mg by mouth 2 (two) times daily.       [name] docusate sodium (COLACE) 100 MG capsule Take 100 mg by mouth at bedtime. Patient uses this medication for stool softening.      . DULoxetine (CYMBALTA) 60 MG capsule Take 60 mg by mouth at bedtime.       . enalapril (VASOTEC) 20 MG tablet Take 20 mg by mouth 2 (two) times daily.       . furosemide (LASIX) 40 MG tablet Take 20 mg by mouth every Monday, Wednesday, and Friday.       . gabapentin (NEURONTIN) 300 MG capsule Take 300 mg by mouth 2 (two) times daily.       [name] glimepiride (AMARYL) 4 MG tablet Take 4 mg by mouth at bedtime.       . IRON PO Take 1 tablet by mouth daily.       . lansoprazole (PREVACID) 30 MG capsule Take 30 mg by mouth daily. Patient uses this medication for heartburn.      . Magnesium 250 MG TABS Take 1 tablet by mouth daily.      [name]  metFORMIN (GLUCOPHAGE) 1000 MG tablet Take 1,000 mg by mouth 2 (two) times daily with a meal.      . omega-3 acid ethyl esters (LOVAZA) 1 G capsule Take 2 g by mouth 2 (two) times daily.      . simvastatin (ZOCOR) 20 MG tablet Take 20 mg by mouth every evening.      . testosterone cypionate (DEPOTESTOTERONE CYPIONATE) 100 MG/ML injection Inject 200 mg into the muscle every 14 (fourteen) days. For IM use only        Results for orders placed during the hospital encounter of 08/03/11 (from the past 48 hour(s))  POCT I-STAT 3, BLOOD GAS (G3+)     Status: Abnormal   Collection Time   08/03/11  3:51 PM      Component Value Range Comment   pH, Arterial 7.421  7.350 - 7.450    pCO2 arterial 36.3  35.0 - 45.0 mmHg    pO2, Arterial 72.0 (*) 80.0 - 100.0 mmHg    Bicarbonate 23.6  20.0 - 24.0 mEq/L    TCO2 25  0 - 100 mmol/L    O2 Saturation 95.0      Acid-base deficit 1.0  0.0 - 2.0 mmol/L    Patient temperature 98.0 F      Collection site RADIAL,  ALLEN'S TEST ACCEPTABLE      Drawn by Operator      Sample type ARTERIAL     URINALYSIS, ROUTINE W REFLEX MICROSCOPIC     Status: Abnormal   Collection Time   08/03/11  3:55 PM      Component Value Range Comment   Color, Urine AMBER (*) YELLOW BIOCHEMICALS MAY BE AFFECTED BY COLOR   APPearance CLEAR  CLEAR    Specific Gravity, Urine 1.028  1.005 - 1.030    pH 6.0  5.0 - 8.0    Glucose, UA NEGATIVE  NEGATIVE mg/dL    Hgb urine dipstick NEGATIVE  NEGATIVE    Bilirubin Urine SMALL (*) NEGATIVE    Ketones, ur 15 (*) NEGATIVE mg/dL    Protein, ur 454 (*) NEGATIVE mg/dL    Urobilinogen, UA 1.0  0.0 - 1.0 mg/dL    Nitrite NEGATIVE  NEGATIVE    Leukocytes, UA SMALL (*) NEGATIVE   URINE MICROSCOPIC-ADD ON     Status: Abnormal   Collection Time   08/03/11  3:55 PM      Component Value Range Comment   Squamous Epithelial / LPF RARE  RARE    WBC, UA 21-50  <3 WBC/hpf    Bacteria, UA RARE  RARE    Casts HYALINE CASTS (*) NEGATIVE   CBC WITH DIFFERENTIAL     Status: Abnormal   Collection Time   08/03/11  4:00 PM      Component Value Range Comment   WBC 11.4 (*) 4.0 - 10.5 K/uL    RBC 5.17  4.22 - 5.81 MIL/uL    Hemoglobin 12.2 (*) 13.0 - 17.0 g/dL    HCT 09.8 (*) 11.9 - 52.0 %    MCV 73.3 (*) 78.0 - 100.0 fL    MCH 23.6 (*) 26.0 - 34.0 pg    MCHC 32.2  30.0 - 36.0 g/dL    RDW 14.7 (*) 82.9 - 15.5 %    Platelets 276  150 - 400 K/uL    Neutrophils Relative 67  43 - 77 %    Lymphocytes Relative 15  12 - 46 %    Monocytes Relative 13 (*) 3 -  12 %    Eosinophils Relative 4  0 - 5 %    Basophils Relative 1  0 - 1 %    Neutro Abs 7.6  1.7 - 7.7 K/uL    Lymphs Abs 1.7  0.7 - 4.0 K/uL    Monocytes Absolute 1.5 (*) 0.1 - 1.0 K/uL    Eosinophils Absolute 0.5  0.0 - 0.7 K/uL    Basophils Absolute 0.1  0.0 - 0.1 K/uL    RBC Morphology POLYCHROMASIA PRESENT     COMPREHENSIVE METABOLIC PANEL     Status: Abnormal   Collection Time   08/03/11  4:00 PM      Component Value Range Comment   Sodium  137  135 - 145 mEq/L    Potassium 4.3  3.5 - 5.1 mEq/L    Chloride 101  96 - 112 mEq/L    CO2 23  19 - 32 mEq/L    Glucose, Bld 99  70 - 99 mg/dL    BUN 14  6 - 23 mg/dL    Creatinine, Ser 0.45  0.50 - 1.35 mg/dL    Calcium 9.6  8.4 - 40.9 mg/dL    Total Protein 7.3  6.0 - 8.3 g/dL    Albumin 3.6  3.5 - 5.2 g/dL    AST 16  0 - 37 U/L    ALT 19  0 - 53 U/L    Alkaline Phosphatase 48  39 - 117 U/L    Total Bilirubin 0.4  0.3 - 1.2 mg/dL    GFR calc non Af Amer 72 (*) >90 mL/min    GFR calc Af Amer 84 (*) >90 mL/min   TROPONIN I     Status: Normal   Collection Time   08/03/11  4:00 PM      Component Value Range Comment   Troponin I <0.30  <0.30 ng/mL   PROTIME-INR     Status: Normal   Collection Time   08/03/11  4:00 PM      Component Value Range Comment   Prothrombin Time 13.0  11.6 - 15.2 seconds    INR 0.96  0.00 - 1.49   D-DIMER, QUANTITATIVE     Status: Abnormal   Collection Time   08/03/11  4:00 PM      Component Value Range Comment   D-Dimer, Quant 0.81 (*) 0.00 - 0.48 ug/mL-FEU   OCCULT BLOOD X 1 CARD TO LAB, STOOL     Status: Normal   Collection Time   08/03/11  4:42 PM      Component Value Range Comment   Fecal Occult Bld NEGATIVE     PRO B NATRIURETIC PEPTIDE     Status: Normal   Collection Time   08/03/11 11:06 PM      Component Value Range Comment   Pro B Natriuretic peptide (BNP) 26.9  0 - 125 pg/mL   CARDIAC PANEL(CRET KIN+CKTOT+MB+TROPI)     Status: Normal   Collection Time   08/03/11 11:06 PM      Component Value Range Comment   Total CK 50  7 - 232 U/L    CK, MB 1.9  0.3 - 4.0 ng/mL    Troponin I <0.30  <0.30 ng/mL    Relative Index RELATIVE INDEX IS INVALID  0.0 - 2.5   CBC     Status: Abnormal   Collection Time   08/03/11 11:08 PM      Component Value Range Comment   WBC 12.9 (*) 4.0 -  10.5 K/uL    RBC 4.94  4.22 - 5.81 MIL/uL    Hemoglobin 11.7 (*) 13.0 - 17.0 g/dL    HCT 16.1 (*) 09.6 - 52.0 %    MCV 74.9 (*) 78.0 - 100.0 fL    MCH 23.7 (*) 26.0  - 34.0 pg    MCHC 31.6  30.0 - 36.0 g/dL    RDW 04.5 (*) 40.9 - 15.5 %    Platelets 252  150 - 400 K/uL   CREATININE, SERUM     Status: Abnormal   Collection Time   08/03/11 11:08 PM      Component Value Range Comment   Creatinine, Ser 1.14  0.50 - 1.35 mg/dL    GFR calc non Af Amer 69 (*) >90 mL/min    GFR calc Af Amer 80 (*) >90 mL/min   PHOSPHORUS     Status: Normal   Collection Time   08/03/11 11:08 PM      Component Value Range Comment   Phosphorus 2.8  2.3 - 4.6 mg/dL    Dg Chest 2 View  08/26/9145  *RADIOLOGY REPORT*  Clinical Data: Shortness of breath.  CHEST - 2 VIEW  Comparison: 07/05/2010.  Findings: Trachea is midline.  Heart size normal.  Lungs are clear. No pleural fluid.  Degenerative changes are seen in the spine with flowing anterior osteophytosis.  IMPRESSION: No acute findings.  Original Report Authenticated By: Reyes Ivan, M.D.   Ct Angio Chest W/cm &/or Wo Cm  08/03/2011  *RADIOLOGY REPORT*  Clinical Data: Dyspnea, elevated D-dimer  CT ANGIOGRAPHY CHEST  Technique:  Multidetector CT imaging of the chest using the standard protocol during bolus administration of intravenous contrast. Multiplanar reconstructed images including MIPs were obtained and reviewed to evaluate the vascular anatomy.  Contrast: 80mL OMNIPAQUE IOHEXOL 350 MG/ML SOLN  Comparison: None.  Findings: There are no filling defects within the pulmonary arteries to suggest acute pulmonary embolism.  No acute findings of the aorta or great vessels.  No pericardial fluid.  Esophagus is normal.  No axillary supraclavicular lymphadenopathy.  Nodule in the inferior pole of the left lobe of thyroid gland measures 2.5 cm.  Review of the lung parenchyma demonstrates no suspicious pulmonary nodules.  IMPRESSION:  1.  No evidence of acute pulmonary embolism. 2.  No acute pulmonary parenchymal findings. 3.  Nodule inferior pole of the left of the thyroid gland. Recommend thyroid ultrasound.  Original Report  Authenticated By: Genevive Bi, M.D.    Review of Systems  Eyes: Negative.   Respiratory: Positive for shortness of breath. Negative for cough, hemoptysis, sputum production and wheezing.   Cardiovascular: Positive for leg swelling.  Gastrointestinal: Negative.   Genitourinary: Negative.   Musculoskeletal: Positive for myalgias. Negative for joint pain and falls.  Skin: Negative.   Neurological: Negative.   Endo/Heme/Allergies: Negative.   Psychiatric/Behavioral: Negative.     Blood pressure 123/63, pulse 98, temperature 99.1 F (37.3 C), temperature source Oral, resp. rate 16, height 6\' 2"  (1.88 m), weight 162.7 kg (358 lb 11 oz), SpO2 97.00%. Physical Exam  Constitutional: He is oriented to person, place, and time. He appears well-developed and well-nourished.       Morbidly obese  HENT:  Head: Normocephalic and atraumatic.  Right Ear: External ear normal.  Left Ear: External ear normal.  Nose: Nose normal.  Mouth/Throat: Oropharynx is clear and moist.  Eyes: Conjunctivae and EOM are normal. Pupils are equal, round, and reactive to light.  Neck: Normal range of motion.  Neck supple.  Cardiovascular: Normal rate, regular rhythm, normal heart sounds and intact distal pulses.   Respiratory: Effort normal. No respiratory distress. He has no wheezes. He has no rales. He exhibits no tenderness.  GI: Soft. Bowel sounds are normal.  Musculoskeletal: Normal range of motion. He exhibits no edema and no tenderness.  Neurological: He is alert and oriented to person, place, and time. He has normal reflexes.  Skin: Skin is warm and dry.  Psychiatric: He has a normal mood and affect. His behavior is normal. Judgment and thought content normal.     Assessment/Plan A 56 year old gentleman presenting with exertional dyspnea. Patient is morbidly obese and has obstructive sleep apnea. The most likely cause of his symptoms is pulmonary hypertension with some pickwickian syndrome. Other  possibilities include early onset of left-sided heart failure. He has chronic kidney disease but this is stable. No evidence of pneumonia or acute viral illness.  Plan #1 exertional dyspnea: Patient will be admitted to a telemetry floor. Will check 2-D echo and serial cardiac enzymes. Will continue with his diuretic and other medications. Depend on the results of the echo we'll plan the next phase of his treatment. Also follow his renal function closely.  Plan #2 UTI: This was initially treated as an outpatient. We will resume antibiotics in the hospital.  Plan #3 chronic kidney disease stage II: His creatinine and BUN seem stable. Continue to monitor.  Plan #4 morbid obesity: Nutritional counseling.  Plan #5 obstructive sleep apnea: Patient on CPAP at home we will continue his CPAP in the hospital as needed.  GARBA,LAWAL 08/04/2011, 12:43 AM

## 2011-08-04 NOTE — Progress Notes (Signed)
   Patient seen and examined. Admitted earlier today. H&P reviewed.  Patient's main concern is shortness of breath with exertion. This has been ongoing for 2 weeks. Also complains of burning sensation with urination.  Vitals reviewed.  Lungs are CTA Heart: S1S2 normal regular, no rubs, murmurs or bruits. Abdo: Soft, NT No edema Alert and oriented x 3. No focal deficits  Work up for his dyspnea is unremarkable for any acute process. Considering his h/o OSA he could have pulmonary hypertension. Will check sats with ambulation. He has an appointment with Dr. Swaziland in August. He may need left and right heart cath.  Will start Vantin for UTI. Check urine cultures.  Possible discharge in AM  Jason Wyatt 2:50 PM

## 2011-08-05 DIAGNOSIS — E1129 Type 2 diabetes mellitus with other diabetic kidney complication: Secondary | ICD-10-CM | POA: Diagnosis present

## 2011-08-05 DIAGNOSIS — M109 Gout, unspecified: Secondary | ICD-10-CM | POA: Diagnosis present

## 2011-08-05 DIAGNOSIS — E119 Type 2 diabetes mellitus without complications: Secondary | ICD-10-CM

## 2011-08-05 LAB — CBC
HCT: 38.6 % — ABNORMAL LOW (ref 39.0–52.0)
Hemoglobin: 12.2 g/dL — ABNORMAL LOW (ref 13.0–17.0)
MCH: 23.8 pg — ABNORMAL LOW (ref 26.0–34.0)
MCHC: 31.6 g/dL (ref 30.0–36.0)
MCV: 75.2 fL — ABNORMAL LOW (ref 78.0–100.0)
Platelets: 245 10*3/uL (ref 150–400)
RBC: 5.13 MIL/uL (ref 4.22–5.81)
RDW: 21.2 % — ABNORMAL HIGH (ref 11.5–15.5)
WBC: 9.2 10*3/uL (ref 4.0–10.5)

## 2011-08-05 LAB — BASIC METABOLIC PANEL
BUN: 14 mg/dL (ref 6–23)
CO2: 24 mEq/L (ref 19–32)
Calcium: 9.7 mg/dL (ref 8.4–10.5)
Chloride: 101 mEq/L (ref 96–112)
Creatinine, Ser: 1.06 mg/dL (ref 0.50–1.35)
GFR calc Af Amer: 88 mL/min — ABNORMAL LOW (ref >90)
GFR calc non Af Amer: 76 mL/min — ABNORMAL LOW (ref >90)
Glucose, Bld: 131 mg/dL — ABNORMAL HIGH (ref 70–99)
Potassium: 4.7 mEq/L (ref 3.5–5.1)
Sodium: 139 mEq/L (ref 135–145)

## 2011-08-05 LAB — GLUCOSE, CAPILLARY
Glucose-Capillary: 131 mg/dL — ABNORMAL HIGH (ref 70–99)
Glucose-Capillary: 124 mg/dL — ABNORMAL HIGH (ref 70–99)

## 2011-08-05 LAB — URINE CULTURE: Colony Count: 1000

## 2011-08-05 MED ORDER — PHENAZOPYRIDINE HCL 100 MG PO TABS: 100.0000 mg | ORAL_TABLET | Freq: Three times a day (TID) | ORAL | Status: AC

## 2011-08-05 MED ORDER — CEFPODOXIME PROXETIL 200 MG PO TABS: 200.0000 mg | ORAL_TABLET | Freq: Two times a day (BID) | ORAL | Status: AC

## 2011-08-05 MED ORDER — PHENAZOPYRIDINE HCL 100 MG PO TABS
100.0000 mg | ORAL_TABLET | Freq: Once | ORAL | Status: AC
  Administered 2011-08-05: 100 mg via ORAL
  Filled 2011-08-05: qty 1

## 2011-08-05 NOTE — Discharge Summary (Signed)
Physician Discharge Summary  Patient ID: Jason Wyatt MRN: [id number] DOB/AGE: [birth year] 56 y.o.  Admit date: 08/03/2011 Discharge date: 08/05/2011  PCP: Nadean Corwin, MD  DISCHARGE DIAGNOSES:  Active Problems:  Dyspnea  UTI (lower urinary tract infection)  Tachycardia  CKD (chronic kidney disease), stage II  Morbid obesity  OSA on CPAP  DM type 2 (diabetes mellitus, type 2)  Gout    DISCHARGE CONDITION: fair  INITIAL HISTORY: A 56 year old morbidly obese man with multiple medical problems including obstructive sleep apnea on CPAP. [facility] with complaints of exertional dyspnea. Symptoms have been going on for 2 months but have gotten worse in the last 2 weeks. He has not had any prior history of CHF. No smoking history and no history of COPD. He denied any chest pain no PND no orthopnea. Patient has noted occasional swelling of his legs bilaterally which comes and goes. He denied any recent GI bleed a viral illness. He has noted that his shortness of breath gets better when he rests and still gets worse with any exertion. No fever, no cough.   HOSPITAL COURSE:   1. Dyspnea on Exertion: He develops shortness of breath even with minimal exertion. He underwent CT angio chest which was negative for PE. ECHO did not reveal any systolic dysfunction. Trivial TR was noted. He ruled out for ACS. His pulse ox did not drop with exertion in the hospital. He will require follow up by Dr. [place]and with whom he has an appointment on Aug 24, 2011. He will be asked to call for an earlier appointment if possible. Patient may require Left and Right heart cath. He has remained stable here in the hospital. He has been asked to avoid exertion and stay off work till seen by his cardiologist.  2. Dysuria/difficulty urination: Prostate was mildly enlarged when examined by EDP. UA was abnormal. Cultures are pending. He was started on Vantin. Last night he had trouble  urinating and required in and out cath. He was offered Flomax today but he has an appointment with a Urologist on Monday and so he declined. He will require imaging study of his GU system. No blood noted on UA and so stone is less likely. He is requesting medication for dysuria and so pyridium will be prescribed.  His other medical problems including Diabetes, Gout, OSA remained stable.  He is stable for discharge today. He has been able to urinate this morning with minimal difficulty.  VASCULAR STUDIES 2D ECHO Study Conclusions  - Left ventricle: Wall thickness was increased in a pattern of mild LVH. Systolic function was normal. The estimated ejection fraction was in the range of 55% to 60%. - Right ventricle: The cavity size was mildly dilated. - Atrial septum: No defect or patent foramen ovale was identified.  PERTINENT LABS:  TSH 3.06. A1C 6.1, Hgb 11.7  IMAGING STUDIES Dg Chest 2 View  08/03/2011  *RADIOLOGY REPORT*  Clinical Data: Shortness of breath.  CHEST - 2 VIEW  Comparison: 07/05/2010.  Findings: Trachea is midline.  Heart size normal.  Lungs are clear. No pleural fluid.  Degenerative changes are seen in the spine with flowing anterior osteophytosis.  IMPRESSION: No acute findings.  Original Report Authenticated By: Reyes Ivan, M.D.   Ct Angio Chest W/cm &/or Wo Cm  08/03/2011  *RADIOLOGY REPORT*  Clinical Data: Dyspnea, elevated D-dimer  CT ANGIOGRAPHY CHEST  Technique:  Multidetector CT imaging of the chest using the standard protocol during  bolus administration of intravenous contrast. Multiplanar reconstructed images including MIPs were obtained and reviewed to evaluate the vascular anatomy.  Contrast: 80mL OMNIPAQUE IOHEXOL 350 MG/ML SOLN  Comparison: None.  Findings: There are no filling defects within the pulmonary arteries to suggest acute pulmonary embolism.  No acute findings of the aorta or great vessels.  No pericardial fluid.  Esophagus is normal.  No axillary  supraclavicular lymphadenopathy.  Nodule in the inferior pole of the left lobe of thyroid gland measures 2.5 cm.  Review of the lung parenchyma demonstrates no suspicious pulmonary nodules.  IMPRESSION:  1.  No evidence of acute pulmonary embolism. 2.  No acute pulmonary parenchymal findings. 3.  Nodule inferior pole of the left of the thyroid gland. Recommend thyroid ultrasound.  Original Report Authenticated By: Genevive Bi, M.D.    DISCHARGE EXAMINATION: Blood pressure 138/93, pulse 99, temperature 97.1 F (36.2 C), temperature source Oral, resp. rate 18, height 6\' 2"  (1.88 m), weight 160.1 kg (352 lb 15.3 oz), SpO2 93.00%. General appearance: alert, cooperative, appears stated age and morbidly obese Head: Normocephalic, without obvious abnormality, atraumatic Resp: clear to auscultation bilaterally Cardio: regular rate and rhythm, S1, S2 normal, no murmur, click, rub or gallop GI: soft, non-tender; bowel sounds normal; no masses,  no organomegaly Extremities: extremities normal, atraumatic, no cyanosis or edema  DISPOSITION: Home  Discharge Orders    Future Appointments: Provider: Department: Dept Phone: Center:   08/24/2011 10:30 AM Peter M Sw[place]il[place], MD [facility] [phone] None     Future Orders Please Complete By Expires   Diet Carb Modified      Increase activity slowly        Current Discharge Medication List    START taking these medications   Details  cefpodoxime (VANTIN) 200 MG tablet Take 1 tablet (200 mg total) by mouth every 12 (twelve) hours. Qty: 12 tablet, Refills: 0    phenazopyridine (PYRIDIUM) 100 MG tablet Take 1 tablet (100 mg total) by mouth 3 (three) times daily. Qty: 6 tablet, Refills: 0      CONTINUE these medications which have NOT CHANGED   Details  allopurinol (ZYLOPRIM) 100 MG tablet Take 100 mg by mouth daily.    aspirin 81 MG tablet Take 81 mg by mouth daily.    colchicine 0.6 MG tablet Take 0.6 mg by mouth 2 (two) times daily.       docusate sodium (COLACE) 100 MG capsule Take 100 mg by mouth at bedtime. Patient uses this medication for stool softening.    DULoxetine (CYMBALTA) 60 MG capsule Take 60 mg by mouth at bedtime.     enalapril (VASOTEC) 20 MG tablet Take 20 mg by mouth 2 (two) times daily.     furosemide (LASIX) 40 MG tablet Take 20 mg by mouth every Monday, Wednesday, and Friday.     gabapentin (NEURONTIN) 300 MG capsule Take 300 mg by mouth 2 (two) times daily.     glimepiride (AMARYL) 4 MG tablet Take 4 mg by mouth at bedtime.     IRON PO Take 1 tablet by mouth daily.     lansoprazole (PREVACID) 30 MG capsule Take 30 mg by mouth daily. Patient uses this medication for heartburn.    Magnesium 250 MG TABS Take 1 tablet by mouth daily.    metFORMIN (GLUCOPHAGE) 1000 MG tablet Take 1,000 mg by mouth 2 (two) times daily with a meal.    omega-3 acid ethyl esters (LOVAZA) 1 G capsule Take 2 g by mouth 2 (two)  times daily.    simvastatin (ZOCOR) 20 MG tablet Take 20 mg by mouth every evening.    testosterone cypionate (DEPOTESTOTERONE CYPIONATE) 100 MG/ML injection Inject 200 mg into the muscle every 14 (fourteen) days. For IM use only       Follow-up Information    Follow up with Nadean Corwin, MD. Schedule an appointment as soon as possible for a visit in 1 week. (post hospitalization follow up)    Contact information:   [address] [facility] [address] [phone]       Follow up with Peter [place], MD. (call to see if you can get in to see him earlier)    Contact information:   [address], [address] [place] [address] [phone]          TOTAL DISCHARGE TIME: 35 mins  [facility]  [facility] Pager [phone]  08/05/2011, 1:34 PM

## 2011-08-05 NOTE — Progress Notes (Signed)
Patient c/o of diff. vding and burning and hurting with urinating. Voiding sm. freq. Amts. clr. yellow urine.vded and bladder scan 559 ml r.negative. Aware and [employer]ings N.P. Page and call return and aware of todays events. See order for I and O cath patient. I and O cath done got 500 ml. Patient tol. Well.

## 2011-08-05 NOTE — Progress Notes (Signed)
Patient amb. In hall on room air sats range from 95-98% patient was D.O.E. Had to stop x 2 . Patient amb. Approx. 300 feet

## 2011-08-05 NOTE — Progress Notes (Signed)
Pt ambulated in hallway 100 ft and tolerated activity fare, c/o some SOB with walking. O2 sats stable on RA. Will continue to monitor.

## 2011-08-21 ENCOUNTER — Telehealth: Payer: Self-pay | Admitting: Cardiology

## 2011-08-21 MED ORDER — FUROSEMIDE 40 MG PO TABS: 40.0000 mg | ORAL_TABLET | ORAL | Status: DC

## 2011-08-21 NOTE — Telephone Encounter (Signed)
Patient has appnt on 08/23/11 (Thursday)   Pt feels like a brick sitting on his chest. SOB of exhertion, patient is really concerned. Please return his call [phone]

## 2011-08-21 NOTE — Telephone Encounter (Signed)
Pt was in hospital for uti and prostate was inflamed to the point of blockage per patient. Pt had DOE on DC and it is continuing, current BP is 123/85 p 118, SOB is noted in speech denies CP, Pt was tachy during hospital stay and DC, DOD Dr Patty Sermons reviewed DC record/chart. Lasix increased to 40 mg Monday/wednesday/friday/ and keep app thurseday with dr [place], pt agreed to plan.

## 2011-08-24 ENCOUNTER — Ambulatory Visit (INDEPENDENT_AMBULATORY_CARE_PROVIDER_SITE_OTHER): Payer: BC Managed Care – PPO | Admitting: Cardiology

## 2011-08-24 ENCOUNTER — Encounter: Payer: Self-pay | Admitting: Cardiology

## 2011-08-24 ENCOUNTER — Other Ambulatory Visit: Payer: Self-pay | Admitting: Cardiology

## 2011-08-24 ENCOUNTER — Encounter (HOSPITAL_COMMUNITY): Payer: Self-pay | Admitting: Pharmacy Technician

## 2011-08-24 VITALS — BP 100/72 | HR 131 | Ht 74.0 in | Wt 352.8 lb

## 2011-08-24 DIAGNOSIS — R0600 Dyspnea, unspecified: Secondary | ICD-10-CM

## 2011-08-24 DIAGNOSIS — E1169 Type 2 diabetes mellitus with other specified complication: Secondary | ICD-10-CM | POA: Insufficient documentation

## 2011-08-24 DIAGNOSIS — E119 Type 2 diabetes mellitus without complications: Secondary | ICD-10-CM

## 2011-08-24 DIAGNOSIS — R0989 Other specified symptoms and signs involving the circulatory and respiratory systems: Secondary | ICD-10-CM

## 2011-08-24 DIAGNOSIS — R5383 Other fatigue: Secondary | ICD-10-CM

## 2011-08-24 DIAGNOSIS — R072 Precordial pain: Secondary | ICD-10-CM

## 2011-08-24 DIAGNOSIS — R5381 Other malaise: Secondary | ICD-10-CM

## 2011-08-24 DIAGNOSIS — R079 Chest pain, unspecified: Secondary | ICD-10-CM

## 2011-08-24 DIAGNOSIS — E785 Hyperlipidemia, unspecified: Secondary | ICD-10-CM

## 2011-08-24 DIAGNOSIS — I10 Essential (primary) hypertension: Secondary | ICD-10-CM

## 2011-08-24 DIAGNOSIS — R000 Tachycardia, unspecified: Secondary | ICD-10-CM

## 2011-08-24 DIAGNOSIS — R609 Edema, unspecified: Secondary | ICD-10-CM

## 2011-08-24 DIAGNOSIS — R0609 Other forms of dyspnea: Secondary | ICD-10-CM

## 2011-08-24 LAB — CBC WITH DIFFERENTIAL/PLATELET
Basophils Absolute: 0.1 10*3/uL (ref 0.0–0.1)
Basophils Relative: 0.7 % (ref 0.0–3.0)
Eosinophils Absolute: 0.2 10*3/uL (ref 0.0–0.7)
Eosinophils Relative: 2.5 % (ref 0.0–5.0)
HCT: 42.5 % (ref 39.0–52.0)
Hemoglobin: 13.3 g/dL (ref 13.0–17.0)
Lymphocytes Relative: 20.4 % (ref 12.0–46.0)
Lymphs Abs: 2 10*3/uL (ref 0.7–4.0)
MCHC: 31.3 g/dL (ref 30.0–36.0)
MCV: 74.7 fl — ABNORMAL LOW (ref 78.0–100.0)
Monocytes Absolute: 1.1 10*3/uL — ABNORMAL HIGH (ref 0.1–1.0)
Monocytes Relative: 10.7 % (ref 3.0–12.0)
Neutro Abs: 6.5 10*3/uL (ref 1.4–7.7)
Neutrophils Relative %: 65.7 % (ref 43.0–77.0)
Platelets: 523 10*3/uL — ABNORMAL HIGH (ref 150.0–400.0)
RBC: 5.68 Mil/uL (ref 4.22–5.81)
RDW: 21.9 % — ABNORMAL HIGH (ref 11.5–14.6)
WBC: 9.9 10*3/uL (ref 4.5–10.5)

## 2011-08-24 LAB — PROTIME-INR
INR: 1 ratio (ref 0.8–1.0)
Prothrombin Time: 10.9 s (ref 10.2–12.4)

## 2011-08-24 LAB — BASIC METABOLIC PANEL
BUN: 26 mg/dL — ABNORMAL HIGH (ref 6–23)
CO2: 26 mEq/L (ref 19–32)
Calcium: 9.9 mg/dL (ref 8.4–10.5)
Chloride: 100 mEq/L (ref 96–112)
Creatinine, Ser: 1.4 mg/dL (ref 0.4–1.5)
GFR: 57.12 mL/min — ABNORMAL LOW (ref >60.00)
Glucose, Bld: 125 mg/dL — ABNORMAL HIGH (ref 70–99)
Potassium: 4.5 mEq/L (ref 3.5–5.1)
Sodium: 138 mEq/L (ref 135–145)

## 2011-08-24 LAB — BRAIN NATRIURETIC PEPTIDE: Pro B Natriuretic peptide (BNP): 5 pg/mL (ref 0.0–100.0)

## 2011-08-24 LAB — APTT: aPTT: 32.6 s — ABNORMAL HIGH (ref 21.7–28.8)

## 2011-08-24 NOTE — Patient Instructions (Signed)
We will schedule you for a right and left heart cath.  We will check lab work today.

## 2011-08-24 NOTE — Assessment & Plan Note (Signed)
His symptoms of chest pain and dyspnea are concerning for angina. Patient has multiple cardiac risk factors. Another potential etiology would be pulmonary hypertension in the setting of morbid obesity and obstructive sleep apnea. His hospital evaluation was unremarkable. He has had normal Myoview studies in the past. I feel however that given his progressive symptoms we should proceed with cardiac catheterization at this time. Include right and left heart catheterization to rule out pulmonary hypertension. If his study is unremarkable and we can effectively rule out significant cardiac disease. If he does have significant coronary disease this will help guide appropriate therapy including possible coronary intervention. The procedure and risk were reviewed in detail with the patient and his family. He is agreeable to proceed and we will schedule this procedure for tomorrow.

## 2011-08-24 NOTE — Assessment & Plan Note (Signed)
His heart rate is elevated but appears to be a sinus tachycardia. No primary arrhythmia identified.

## 2011-08-24 NOTE — Progress Notes (Signed)
 Jason Wyatt Date of Birth: 10/17/1955 Medical Record #[id number]  History of Present Illness: Jason Wyatt is seen at the request of Dr. McKeown for evaluation of chest pain and dyspnea. He is a 56-year-old white male with multiple medical problems and multiple cardiac risk factors. He was hospitalized in July and diagnosed with urinary tract infection prostatitis. He has been complaining of several months history of increasing dyspnea both at rest and with exertion. He complains of marked fatigue ever since April that is getting worse. He also complains of intermittent chest pain feeling like there is a brick sitting on the center of his chest. He states he can induce apostle chores or take a shower without getting markedly fatigued and short of breath. Patient had been evaluated in the past and had a normal nuclear stress test in 2009. He does have a history of obstructive sleep apnea and uses CPAP therapy regularly. He reports a 41 pound loss in the last 4-5 months. While in the hospital he had a CT of the chest which showed no evidence of pulmonary emboli. An incidental nodule in the left thyroid was noted. Echocardiogram showed mild LVH with normal systolic function. The right ventricle was noted to be mildly dilated. No significant valvular disease was mentioned. He also complains of frequent tachycardia but while in the hospital he was noted to have a sinus tachycardia without significant other arrhythmias.  Current Outpatient Prescriptions on File Prior to Visit  Medication Sig Dispense Refill  . allopurinol (ZYLOPRIM) 100 MG tablet Take 100 mg by mouth daily.      . aspirin 81 MG tablet Take 81 mg by mouth daily.      . colchicine 0.6 MG tablet Take 0.6 mg by mouth 2 (two) times daily.       . docusate sodium (COLACE) 100 MG capsule Take 100 mg by mouth at bedtime. Patient uses this medication for stool softening.      . DULoxetine (CYMBALTA) 60 MG capsule Take 60 mg by mouth at bedtime.         . enalapril (VASOTEC) 20 MG tablet Take 20 mg by mouth 2 (two) times daily.       . gabapentin (NEURONTIN) 300 MG capsule Take 300 mg by mouth 3 (three) times daily.       . glimepiride (AMARYL) 4 MG tablet Take 4 mg by mouth at bedtime.       . IRON PO Take 1 tablet by mouth daily.       . lansoprazole (PREVACID) 30 MG capsule Take 30 mg by mouth daily. Patient uses this medication for heartburn.      . Magnesium 250 MG TABS Take 1 tablet by mouth daily.      . metFORMIN (GLUCOPHAGE) 1000 MG tablet Take 1,000 mg by mouth 2 (two) times daily with a meal.      . omega-3 acid ethyl esters (LOVAZA) 1 G capsule Take 2 g by mouth 4 (four) times daily.       . simvastatin (ZOCOR) 20 MG tablet Take 20 mg by mouth every evening.      . testosterone cypionate (DEPOTESTOTERONE CYPIONATE) 100 MG/ML injection Inject 200 mg into the muscle every 14 (fourteen) days. For IM use only      . DISCONTD: furosemide (LASIX) 40 MG tablet Take 1 tablet (40 mg total) by mouth every Monday, Wednesday, and Friday.  30 tablet  1    Allergies  Allergen Reactions  . Lyrica (Pregabalin)   Itching and Rash    Past Medical History  Diagnosis Date  . Varicose veins   . Gout   . Hypertension   . Morbid obesity   . Complication of anesthesia 06/2010    "woke up next day w/ventilator on"  . High cholesterol   . Peripheral neuropathy   . Exertional dyspnea 08/03/11  . OSA (obstructive sleep apnea)     "use CPAP"  . Type II diabetes mellitus   . GERD (gastroesophageal reflux disease)   . Arthritis     "in my back"  . Chronic UTI (urinary tract infection) 08/03/11    "last month or so"    Past Surgical History  Procedure Date  . Posterior fusion cervical spine 06/2010    "put rods and screws in"  . Uvulopalatopharyngoplasty, tonsillectomy and septoplasty 1990's  . Tonsillectomy and adenoidectomy 1990's    "for sleep apnea"    History  Smoking status  . Never Smoker   Smokeless tobacco  . Never Used     History  Alcohol Use No    Family History  Problem Relation Age of Onset  . Hypertension Mother   . Cancer Father   . Hyperlipidemia Sister   . Hypertension Sister     Review of Systems: The review of systems is positive for recent urinary tract infection with prostatitis. His blood pressures are labile.  All other systems were reviewed and are negative.  Physical Exam: BP 100/72  Pulse 131  Ht 6' 2" (1.88 m)  Wt 160.029 kg (352 lb 12.8 oz)  BMI 45.30 kg/m2 He is a morbidly obese white male in no acute distress. He is normocephalic, atraumatic. Pupils are equal round and reactive to light accommodation. Extraocular movements are full. Oropharynx is clear. Neck is supple no jugular venous distention, adenopathy, or thyromegaly. There are no bruits. Lungs are clear. Cardiac exam reveals a regular rate and rhythm with tachycardia. S1 and S2 are normal. There are no gallops, murmur, or click. Abdomen is morbidly obese, soft, nontender. No obvious masses or hepatosplenomegaly. Radial and pedal pulses are normal. He has no significant edema. There is no cyanosis. Skin is warm and dry. He is alert and oriented x3. Cranial nerves II through XII are intact. He has no focal findings. LABORATORY DATA: ECG today demonstrates sinus tachycardia with a rate of 114 beats per minute. He has an incomplete right bundle branch block and left anterior fascicular block. Cannot rule out inferior infarct age undetermined.  Assessment / Plan:   

## 2011-08-24 NOTE — Assessment & Plan Note (Signed)
Dyspnea is probably multifactorial. He has obstructive sleep apnea. He is morbidly obese and has restrictive lung disease. Need to rule out coronary ischemia or pulmonary hypertension.

## 2011-08-25 ENCOUNTER — Encounter (HOSPITAL_COMMUNITY)
Admission: RE | Disposition: A | Discharge status: Home / Self Care | Payer: Self-pay | Source: Ambulatory Visit | Attending: Cardiology

## 2011-08-25 ENCOUNTER — Ambulatory Visit (HOSPITAL_COMMUNITY)
Admission: RE | Admit: 2011-08-25 | Discharge: 2011-08-25 | Disposition: A | Discharge status: Home / Self Care | Payer: BC Managed Care – PPO | Source: Ambulatory Visit | Attending: Cardiology | Admitting: Cardiology

## 2011-08-25 DIAGNOSIS — R079 Chest pain, unspecified: Secondary | ICD-10-CM | POA: Insufficient documentation

## 2011-08-25 DIAGNOSIS — E119 Type 2 diabetes mellitus without complications: Secondary | ICD-10-CM | POA: Insufficient documentation

## 2011-08-25 DIAGNOSIS — R0602 Shortness of breath: Secondary | ICD-10-CM

## 2011-08-25 DIAGNOSIS — I10 Essential (primary) hypertension: Secondary | ICD-10-CM | POA: Insufficient documentation

## 2011-08-25 DIAGNOSIS — R0989 Other specified symptoms and signs involving the circulatory and respiratory systems: Secondary | ICD-10-CM | POA: Insufficient documentation

## 2011-08-25 DIAGNOSIS — R0609 Other forms of dyspnea: Secondary | ICD-10-CM | POA: Insufficient documentation

## 2011-08-25 HISTORY — PX: LEFT AND RIGHT HEART CATHETERIZATION WITH CORONARY ANGIOGRAM: SHX5449

## 2011-08-25 LAB — POCT I-STAT 3, ART BLOOD GAS (G3+)
Acid-Base Excess: 1 mmol/L (ref 0.0–2.0)
Bicarbonate: 25.2 mEq/L — ABNORMAL HIGH (ref 20.0–24.0)
O2 Saturation: 95 %
TCO2: 26 mmol/L (ref 0–100)
pCO2 arterial: 36.3 mmHg (ref 35.0–45.0)
pH, Arterial: 7.449 (ref 7.350–7.450)
pO2, Arterial: 70 mmHg — ABNORMAL LOW (ref 80.0–100.0)

## 2011-08-25 SURGERY — LEFT AND RIGHT HEART CATHETERIZATION WITH CORONARY ANGIOGRAM
Anesthesia: LOCAL

## 2011-08-25 MED ORDER — HEPARIN SODIUM (PORCINE) 1000 UNIT/ML IJ SOLN
INTRAMUSCULAR | Status: AC
  Filled 2011-08-25: qty 1

## 2011-08-25 MED ORDER — LIDOCAINE HCL (PF) 1 % IJ SOLN
INTRAMUSCULAR | Status: AC
  Filled 2011-08-25: qty 30

## 2011-08-25 MED ORDER — ONDANSETRON HCL 4 MG/2ML IJ SOLN: 4.0000 mg | Freq: Four times a day (QID) | INTRAMUSCULAR | Status: DC | PRN

## 2011-08-25 MED ORDER — NITROGLYCERIN 0.2 MG/ML ON CALL CATH LAB
INTRAVENOUS | Status: AC
  Filled 2011-08-25: qty 1

## 2011-08-25 MED ORDER — METFORMIN HCL 500 MG PO TABS: 1000.0000 mg | ORAL_TABLET | Freq: Two times a day (BID) | ORAL | Status: DC

## 2011-08-25 MED ORDER — SODIUM CHLORIDE 0.9 % IJ SOLN: 3.0000 mL | Freq: Two times a day (BID) | INTRAMUSCULAR | Status: DC

## 2011-08-25 MED ORDER — ASPIRIN 81 MG PO CHEW
324.0000 mg | CHEWABLE_TABLET | ORAL | Status: AC
Start: 2011-08-25 — End: 2011-08-25
  Administered 2011-08-25: 324 mg via ORAL
  Filled 2011-08-25: qty 4

## 2011-08-25 MED ORDER — SODIUM CHLORIDE 0.9 % IV SOLN: 250.0000 mL | INTRAVENOUS | Status: DC | PRN

## 2011-08-25 MED ORDER — HEPARIN (PORCINE) IN NACL 2-0.9 UNIT/ML-% IJ SOLN
INTRAMUSCULAR | Status: AC
  Filled 2011-08-25: qty 2000

## 2011-08-25 MED ORDER — VERAPAMIL HCL 2.5 MG/ML IV SOLN
INTRAVENOUS | Status: AC
  Filled 2011-08-25: qty 2

## 2011-08-25 MED ORDER — SODIUM CHLORIDE 0.9 % IV SOLN: 1.0000 mL/kg/h | INTRAVENOUS | Status: DC

## 2011-08-25 MED ORDER — SODIUM CHLORIDE 0.9 % IV SOLN
INTRAVENOUS | Status: DC
  Administered 2011-08-25: 15:00:00 via INTRAVENOUS

## 2011-08-25 MED ORDER — ACETAMINOPHEN 325 MG PO TABS: 650.0000 mg | ORAL_TABLET | ORAL | Status: DC | PRN

## 2011-08-25 MED ORDER — SODIUM CHLORIDE 0.9 % IJ SOLN: 3.0000 mL | INTRAMUSCULAR | Status: DC | PRN

## 2011-08-25 MED ORDER — DIAZEPAM 5 MG PO TABS
5.0000 mg | ORAL_TABLET | ORAL | Status: AC
  Administered 2011-08-25: 5 mg via ORAL
  Filled 2011-08-25: qty 1

## 2011-08-25 NOTE — H&P (View-Only) (Signed)
Jason Wyatt Headings Date of Birth: 18-Jun-1955 Medical Record #[id number]  History of Present Illness: [name] is seen at the request of Dr. Oneta Rack for evaluation of chest pain and dyspnea. He is a 56 year old white male with multiple medical problems and multiple cardiac risk factors. He was hospitalized in July and diagnosed with urinary tract infection prostatitis. He has been complaining of several months history of increasing dyspnea both at rest and with exertion. He complains of marked fatigue ever since April that is getting worse. He also complains of intermittent chest pain feeling like there is a brick sitting on the center of his chest. He states he can induce apostle chores or take a shower without getting markedly fatigued and short of breath. Patient had been evaluated in the past and had a normal nuclear stress test in 2009. He does have a history of obstructive sleep apnea and uses CPAP therapy regularly. He reports a 41 pound loss in the last 4-5 months. While in the hospital he had a CT of the chest which showed no evidence of pulmonary emboli. An incidental nodule in the left thyroid was noted. Echocardiogram showed mild LVH with normal systolic function. The right ventricle was noted to be mildly dilated. No significant valvular disease was mentioned. He also complains of frequent tachycardia but while in the hospital he was noted to have a sinus tachycardia without significant other arrhythmias.  Current Outpatient Prescriptions on File Prior to Visit  Medication Sig Dispense Refill  . allopurinol (ZYLOPRIM) 100 MG tablet Take 100 mg by mouth daily.      [name] aspirin 81 MG tablet Take 81 mg by mouth daily.      . colchicine 0.6 MG tablet Take 0.6 mg by mouth 2 (two) times daily.       [name] docusate sodium (COLACE) 100 MG capsule Take 100 mg by mouth at bedtime. Patient uses this medication for stool softening.      . DULoxetine (CYMBALTA) 60 MG capsule Take 60 mg by mouth at bedtime.         . enalapril (VASOTEC) 20 MG tablet Take 20 mg by mouth 2 (two) times daily.       [name] gabapentin (NEURONTIN) 300 MG capsule Take 300 mg by mouth 3 (three) times daily.       [name] glimepiride (AMARYL) 4 MG tablet Take 4 mg by mouth at bedtime.       . IRON PO Take 1 tablet by mouth daily.       . lansoprazole (PREVACID) 30 MG capsule Take 30 mg by mouth daily. Patient uses this medication for heartburn.      . Magnesium 250 MG TABS Take 1 tablet by mouth daily.      . metFORMIN (GLUCOPHAGE) 1000 MG tablet Take 1,000 mg by mouth 2 (two) times daily with a meal.      . omega-3 acid ethyl esters (LOVAZA) 1 G capsule Take 2 g by mouth 4 (four) times daily.       . simvastatin (ZOCOR) 20 MG tablet Take 20 mg by mouth every evening.      . testosterone cypionate (DEPOTESTOTERONE CYPIONATE) 100 MG/ML injection Inject 200 mg into the muscle every 14 (fourteen) days. For IM use only      . DISCONTD: furosemide (LASIX) 40 MG tablet Take 1 tablet (40 mg total) by mouth every Monday, Wednesday, and Friday.  30 tablet  1    Allergies  Allergen Reactions  . Lyrica (Pregabalin)  Itching and Rash    Past Medical History  Diagnosis Date  . Varicose veins   . Gout   . Hypertension   . Morbid obesity   . Complication of anesthesia 06/2010    "woke up next day w/ventilator on"  . High cholesterol   . Peripheral neuropathy   . Exertional dyspnea 08/03/11  . OSA (obstructive sleep apnea)     "use CPAP"  . Type II diabetes mellitus   . GERD (gastroesophageal reflux disease)   . Arthritis     "in my back"  . Chronic UTI (urinary tract infection) 08/03/11    "last month or so"    Past Surgical History  Procedure Date  . Posterior fusion cervical spine 06/2010    "put rods and screws in"  . Uvulopalatopharyngoplasty, tonsillectomy and septoplasty 1990's  . Tonsillectomy and adenoidectomy 1990's    "for sleep apnea"    History  Smoking status  . Never Smoker   Smokeless tobacco  . Never Used     History  Alcohol Use No    Family History  Problem Relation Age of Onset  . Hypertension Mother   . Cancer Father   . Hyperlipidemia Sister   . Hypertension Sister     Review of Systems: The review of systems is positive for recent urinary tract infection with prostatitis. His blood pressures are labile.  All other systems were reviewed and are negative.  Physical Exam: BP 100/72  Pulse 131  Ht 6\' 2"  (1.88 m)  Wt 160.029 kg (352 lb 12.8 oz)  BMI 45.30 kg/m2 He is a morbidly obese white male in no acute distress. He is normocephalic, atraumatic. Pupils are equal round and reactive to light accommodation. Extraocular movements are full. Oropharynx is clear. Neck is supple no jugular venous distention, adenopathy, or thyromegaly. There are no bruits. Lungs are clear. Cardiac exam reveals a regular rate and rhythm with tachycardia. S1 and S2 are normal. There are no gallops, murmur, or click. Abdomen is morbidly obese, soft, nontender. No obvious masses or hepatosplenomegaly. Radial and pedal pulses are normal. He has no significant edema. There is no cyanosis. Skin is warm and dry. He is alert and oriented x3. Cranial nerves II through XII are intact. He has no focal findings. LABORATORY DATA: ECG today demonstrates sinus tachycardia with a rate of 114 beats per minute. He has an incomplete right bundle branch block and left anterior fascicular block. Cannot rule out inferior infarct age undetermined.  Assessment / Plan:

## 2011-08-25 NOTE — CV Procedure (Signed)
   Cardiac Catheterization Procedure Note  Name: Jason Wyatt MRN: [id number] DOB: [birth year]  Procedure: Right Heart Cath, Left Heart Cath, Selective Coronary Angiography, LV angiography  Indication: 56 year old white male with history of morbid obesity, diabetes, and hypertension presents with progressive symptoms of dyspnea and chest pain.   Procedural Details: The right radial area was prepped, draped, and anesthetized with 1% lidocaine. Using the modified Seldinger technique a 5 French sheath was placed in the right radial artery and a 7 French sheath was placed in the right brachial vein. Verapamil 2.5 mg was given intra-arterially. A Swan-Ganz catheter was used for the right heart catheterization. Standard protocol was followed for recording of right heart pressures and sampling of oxygen saturations. Fick cardiac output was calculated. Standard Judkins catheters were used for selective coronary angiography and left ventriculography. There were no immediate procedural complications. The patient was transferred to the post catheterization recovery area for further monitoring.  Procedural Findings: Hemodynamics RA 16/14 with a mean of 14 mmHg RV 29/11 mmHg PA 24/15 with a mean of 18 mmHg PCWP 14 over 14 with a mean of 12 mmHg LV 99/9 mmHg AO 96/70 with a mean of 82 mmHg  Oxygen saturations: PA 63% AO 95%  Cardiac Output (Fick) 6.36 L per minute  Cardiac Index (Fick) 2.3 L per minute per meters squared   Coronary angiography: Coronary dominance: right  Left mainstem: Normal.  Left anterior descending (LAD): Normal.  There is a large ramus intermediate branch which is normal.  Left circumflex (LCx): Normal.  Right coronary artery (RCA): Normal.  Left ventriculography: Left ventricular systolic function is normal, LVEF is estimated at 55-65%, there is no significant mitral regurgitation   Final Conclusions:   1. Normal coronary anatomy. 2. Normal left ventricular  function. 3. Normal right heart pressures.  Recommendations: Recommend discontinuing diuretic therapy since his filling pressures are low. Otherwise continue risk factor modification. Encouraged patient in a regular exercise program and weight loss.   Theron Arista [facility] 08/25/2011, 5:59 PM

## 2011-08-25 NOTE — Interval H&P Note (Signed)
History and Physical Interval Note:  08/25/2011 4:55 PM  Jason Wyatt  has presented today for surgery, with the diagnosis of Chest pain  The various methods of treatment have been discussed with the patient and family. After consideration of risks, benefits and other options for treatment, the patient has consented to  Procedure(s) (LRB): LEFT AND RIGHT HEART CATHETERIZATION WITH CORONARY ANGIOGRAM (N/A) as a surgical intervention .  The patient's history has been reviewed, patient examined, no change in status, stable for surgery.  I have reviewed the patient's chart and labs.  Questions were answered to the patient's satisfaction.     Theron Arista [facility] 08/25/2011 4:56 PM

## 2011-08-28 LAB — POCT I-STAT 3, VENOUS BLOOD GAS (G3P V)
Acid-Base Excess: 3 mmol/L — ABNORMAL HIGH (ref 0.0–2.0)
Bicarbonate: 27.2 mEq/L — ABNORMAL HIGH (ref 20.0–24.0)
O2 Saturation: 63 %
TCO2: 28 mmol/L (ref 0–100)
pCO2, Ven: 41.2 mmHg — ABNORMAL LOW (ref 45.0–50.0)
pH, Ven: 7.428 — ABNORMAL HIGH (ref 7.250–7.300)
pO2, Ven: 32 mmHg (ref 30.0–45.0)

## 2011-08-29 ENCOUNTER — Other Ambulatory Visit: Payer: Self-pay | Admitting: Internal Medicine

## 2011-08-29 DIAGNOSIS — E041 Nontoxic single thyroid nodule: Secondary | ICD-10-CM

## 2011-08-31 ENCOUNTER — Ambulatory Visit
Admission: RE | Admit: 2011-08-31 | Discharge: 2011-08-31 | Disposition: A | Discharge status: Home / Self Care | Payer: BC Managed Care – PPO | Source: Ambulatory Visit | Attending: Internal Medicine | Admitting: Internal Medicine

## 2011-08-31 DIAGNOSIS — E041 Nontoxic single thyroid nodule: Secondary | ICD-10-CM

## 2011-09-15 ENCOUNTER — Other Ambulatory Visit: Payer: Self-pay | Admitting: Otolaryngology

## 2011-09-15 DIAGNOSIS — E041 Nontoxic single thyroid nodule: Secondary | ICD-10-CM

## 2011-09-19 ENCOUNTER — Ambulatory Visit
Admission: RE | Admit: 2011-09-19 | Discharge: 2011-09-19 | Disposition: A | Discharge status: Home / Self Care | Payer: BC Managed Care – PPO | Source: Ambulatory Visit | Attending: Otolaryngology | Admitting: Otolaryngology

## 2011-09-19 ENCOUNTER — Other Ambulatory Visit (HOSPITAL_COMMUNITY)
Admission: RE | Admit: 2011-09-19 | Discharge: 2011-09-19 | Disposition: A | Discharge status: Home / Self Care | Payer: BC Managed Care – PPO | Source: Ambulatory Visit | Attending: Interventional Radiology | Admitting: Interventional Radiology

## 2011-09-19 DIAGNOSIS — E041 Nontoxic single thyroid nodule: Secondary | ICD-10-CM

## 2011-09-19 DIAGNOSIS — E049 Nontoxic goiter, unspecified: Secondary | ICD-10-CM | POA: Insufficient documentation

## 2011-09-20 ENCOUNTER — Encounter: Payer: Self-pay | Admitting: Nurse Practitioner

## 2011-09-20 ENCOUNTER — Ambulatory Visit (INDEPENDENT_AMBULATORY_CARE_PROVIDER_SITE_OTHER): Payer: BC Managed Care – PPO | Admitting: Nurse Practitioner

## 2011-09-20 VITALS — BP 140/84 | HR 86 | Ht 75.0 in | Wt 367.0 lb

## 2011-09-20 DIAGNOSIS — Z9889 Other specified postprocedural states: Secondary | ICD-10-CM

## 2011-09-20 LAB — BASIC METABOLIC PANEL
BUN: 16 mg/dL (ref 6–23)
CO2: 23 mEq/L (ref 19–32)
Calcium: 9.2 mg/dL (ref 8.4–10.5)
Chloride: 107 mEq/L (ref 96–112)
Creatinine, Ser: 1 mg/dL (ref 0.4–1.5)
GFR: 78.7 mL/min (ref >60.00)
Glucose, Bld: 114 mg/dL — ABNORMAL HIGH (ref 70–99)
Potassium: 4.2 mEq/L (ref 3.5–5.1)
Sodium: 137 mEq/L (ref 135–145)

## 2011-09-20 NOTE — Progress Notes (Signed)
Jason Wyatt Date of Birth: 09-Oct-1955 Medical Record #[id number]  History of Present Illness: Jason Wyatt is seen back today for a post hospital visit. He is seen for Dr. [place]. He had been in the hospital back in July with UTI/prostatitis. He was initially seen here last month with chest pain and dyspnea. He has multiple issues including OSA, morbid obesity, thyroid nodule, mild LVH per echo, sinus tachycardia, gout, HTN, HLD, neuropathy, DM, GERD, OA and chronic UTI's. He underwent cardiac cath in light of his symptoms and multiple risk factors which showed normal LV function, normal coronaries and normal right heart pressures. Filling pressures were low and his diuretics were stopped.  He comes in today. He is here alone. He remains fatigued but overall, he is doing ok. Had a thyroid biopsy yesterday. For EGD next week. No problems with his cath site (right brachial). He is tolerating his medicines. His heart rate is a little lower and he is happy with that. He is limited by his weight in regards to exercise.   Current Outpatient Prescriptions on File Prior to Visit  Medication Sig Dispense Refill  . acyclovir (ZOVIRAX) 400 MG tablet Take 400 mg by mouth daily as needed. For cold sores      . allopurinol (ZYLOPRIM) 300 MG tablet Take 300 mg by mouth daily.      [name] aspirin 81 MG tablet Take 81 mg by mouth daily.      . colchicine 0.6 MG tablet Take 0.6 mg by mouth daily.       [name] docusate sodium (COLACE) 100 MG capsule Take 100 mg by mouth at bedtime. Patient uses this medication for stool softening.      [name] doxycycline (DORYX) 100 MG DR capsule Take 100 mg by mouth 2 (two) times daily.      . DULoxetine (CYMBALTA) 60 MG capsule Take 60 mg by mouth at bedtime.       . enalapril (VASOTEC) 20 MG tablet Take 20 mg by mouth 2 (two) times daily.       . ferrous sulfate 325 (65 FE) MG tablet Take 325 mg by mouth daily with breakfast.      . gabapentin (NEURONTIN) 300 MG capsule Take 300 mg by mouth 3  (three) times daily.       [name] glimepiride (AMARYL) 4 MG tablet Take 4 mg by mouth at bedtime.       . lansoprazole (PREVACID) 30 MG capsule Take 30 mg by mouth daily. Patient uses this medication for heartburn.      . Magnesium 250 MG TABS Take 1 tablet by mouth daily.      . Multiple Vitamin (MULTIVITAMIN WITH MINERALS) TABS Take 1 tablet by mouth daily.      [name] NASONEX 50 MCG/ACT nasal spray Place 2 sprays into the nose daily.       [name] omega-3 acid ethyl esters (LOVAZA) 1 G capsule Take 2 g by mouth 2 (two) times daily.       . simvastatin (ZOCOR) 20 MG tablet Take 20 mg by mouth every evening.      . Tamsulosin HCl (FLOMAX) 0.4 MG CAPS Take 0.4 mg by mouth daily.       . Vitamin D, Ergocalciferol, (DRISDOL) 50000 UNITS CAPS 50,000 Units every 7 (seven) days.         Allergies  Allergen Reactions  . Lyrica (Pregabalin) Itching and Rash    Past Medical History  Diagnosis Date  . Varicose veins   .  Gout   . Hypertension   . Morbid obesity   . Complication of anesthesia 06/2010    "woke up next day w/ventilator on"  . High cholesterol   . Peripheral neuropathy   . Exertional dyspnea 08/03/11  . OSA (obstructive sleep apnea)     "use CPAP"  . Type II diabetes mellitus   . GERD (gastroesophageal reflux disease)   . Arthritis     "in my back"  . Chronic UTI (urinary tract infection) 08/03/11    "last month or so"  . S/P cardiac catheterization 08/25/11    normal coronary anatomy; normal LV function; normal right heart pressures    Past Surgical History  Procedure Date  . Posterior fusion cervical spine 06/2010    "put rods and screws in"  . Uvulopalatopharyngoplasty, tonsillectomy and septoplasty 1990's  . Tonsillectomy and adenoidectomy 1990's    "for sleep apnea"    History  Smoking status  . Never Smoker   Smokeless tobacco  . Never Used    History  Alcohol Use No    Family History  Problem Relation Age of Onset  . Hypertension Mother   . Cancer Father   .  Hyperlipidemia Sister   . Hypertension Sister     Review of Systems: The review of systems is per the HPI.  All other systems were reviewed and are negative.  Physical Exam: BP 140/84  Pulse 86  Ht 6\' 3"  (1.905 m)  Wt 367 lb (166.47 kg)  BMI 45.87 kg/m2 Patient is very pleasant and in no acute distress. He is obese. Skin is warm and dry. Color is normal.  HEENT is unremarkable. Normocephalic/atraumatic. PERRL. Sclera are nonicteric. Neck is supple. No masses. No JVD. Lungs are clear. Cardiac exam shows a regular rate and rhythm. Abdomen is soft. Extremities are without edema. Gait and ROM are intact. No gross neurologic deficits noted.  LABORATORY DATA:  Cardiac Cath Final Conclusions:  1. Normal coronary anatomy.  2. Normal left ventricular function.  3. Normal right heart pressures.  Recommendations: Recommend discontinuing diuretic therapy since his filling pressures are low. Otherwise continue risk factor modification. Encouraged patient in a regular exercise program and weight loss.   Theron Arista [facility]  08/25/2011, 5:59 PM   Lab Results  Component Value Date   WBC 9.9 08/24/2011   HGB 13.3 08/24/2011   HCT 42.5 08/24/2011   PLT 523.0* 08/24/2011   GLUCOSE 125* 08/24/2011   ALT 20 08/04/2011   AST 18 08/04/2011   NA 138 08/24/2011   K 4.5 08/24/2011   CL 100 08/24/2011   CREATININE 1.4 08/24/2011   BUN 26* 08/24/2011   CO2 26 08/24/2011   TSH 3.066 08/03/2011   INR 1.0 08/24/2011   HGBA1C 6.1* 08/03/2011   Assessment / Plan:  1. S/P cardiac catheterization. His cath was normal. His chest pain and dyspnea are not felt to be heart related. Still needs to work on risk factor modification. He is for EGD next week. We will see him back prn and otherwise will defer his management back to his PCP.   2. HTN - will follow. No change in his current regimen.   3. Obesity  4. Mild renal insufficiency - will need to recheck a BMET today.  He is felt to be stable from our standpoint. Will see him  back prn. Patient is agreeable to this plan and will call if any problems develop in the interim.

## 2011-09-20 NOTE — Patient Instructions (Addendum)
We need to check some lab today  We will see you back as needed  Staying active is encouraged and work on your weight  Call the [facility] Care office at [phone] if you have any questions, problems or concerns.

## 2011-09-22 ENCOUNTER — Telehealth: Payer: Self-pay

## 2011-09-22 NOTE — Telephone Encounter (Signed)
Received fax from Dr.Buccini's office requesting patient's last office visit,cath report,echo,ekg,faxed to [phone] 09/22/11.

## 2011-09-25 ENCOUNTER — Telehealth: Payer: Self-pay | Admitting: *Deleted

## 2011-09-25 NOTE — Telephone Encounter (Signed)
Pt aware of results. Amanda Becker, CMA 

## 2011-09-25 NOTE — Telephone Encounter (Signed)
Message copied by Awilda Bill on Mon Sep 25, 2011  3:54 PM ------      Message from: Rosalio Macadamia      Created: Wed Sep 20, 2011  1:33 PM       Ok to report. Labs are satisfactory.

## 2011-09-26 ENCOUNTER — Encounter (HOSPITAL_COMMUNITY): Payer: Self-pay | Admitting: *Deleted

## 2011-09-26 ENCOUNTER — Ambulatory Visit (HOSPITAL_COMMUNITY)
Admission: RE | Admit: 2011-09-26 | Discharge: 2011-09-26 | Disposition: A | Discharge status: Home / Self Care | Payer: BC Managed Care – PPO | Source: Ambulatory Visit | Attending: Gastroenterology | Admitting: Gastroenterology

## 2011-09-26 ENCOUNTER — Encounter (HOSPITAL_COMMUNITY): Payer: Self-pay | Admitting: Anesthesiology

## 2011-09-26 ENCOUNTER — Encounter (HOSPITAL_COMMUNITY)
Admission: RE | Disposition: A | Discharge status: Home / Self Care | Payer: Self-pay | Source: Ambulatory Visit | Attending: Gastroenterology

## 2011-09-26 ENCOUNTER — Other Ambulatory Visit: Payer: Self-pay | Admitting: Gastroenterology

## 2011-09-26 ENCOUNTER — Ambulatory Visit (HOSPITAL_COMMUNITY): Payer: BC Managed Care – PPO | Admitting: Anesthesiology

## 2011-09-26 DIAGNOSIS — K449 Diaphragmatic hernia without obstruction or gangrene: Secondary | ICD-10-CM | POA: Insufficient documentation

## 2011-09-26 DIAGNOSIS — Z79899 Other long term (current) drug therapy: Secondary | ICD-10-CM | POA: Insufficient documentation

## 2011-09-26 DIAGNOSIS — K2971 Gastritis, unspecified, with bleeding: Secondary | ICD-10-CM | POA: Insufficient documentation

## 2011-09-26 DIAGNOSIS — R634 Abnormal weight loss: Secondary | ICD-10-CM | POA: Insufficient documentation

## 2011-09-26 DIAGNOSIS — D649 Anemia, unspecified: Secondary | ICD-10-CM | POA: Insufficient documentation

## 2011-09-26 DIAGNOSIS — G589 Mononeuropathy, unspecified: Secondary | ICD-10-CM | POA: Insufficient documentation

## 2011-09-26 DIAGNOSIS — E119 Type 2 diabetes mellitus without complications: Secondary | ICD-10-CM | POA: Insufficient documentation

## 2011-09-26 DIAGNOSIS — G4730 Sleep apnea, unspecified: Secondary | ICD-10-CM | POA: Insufficient documentation

## 2011-09-26 DIAGNOSIS — Z7982 Long term (current) use of aspirin: Secondary | ICD-10-CM | POA: Insufficient documentation

## 2011-09-26 HISTORY — PX: ESOPHAGOGASTRODUODENOSCOPY: SHX5428

## 2011-09-26 LAB — GLUCOSE, CAPILLARY: Glucose-Capillary: 108 mg/dL — ABNORMAL HIGH (ref 70–99)

## 2011-09-26 SURGERY — EGD (ESOPHAGOGASTRODUODENOSCOPY)
Anesthesia: Monitor Anesthesia Care

## 2011-09-26 MED ORDER — MIDAZOLAM HCL 5 MG/5ML IJ SOLN
INTRAMUSCULAR | Status: DC | PRN
  Administered 2011-09-26: 2 mg via INTRAVENOUS

## 2011-09-26 MED ORDER — LACTATED RINGERS IV SOLN
INTRAVENOUS | Status: DC
  Administered 2011-09-26: 1000 mL via INTRAVENOUS

## 2011-09-26 MED ORDER — LACTATED RINGERS IV SOLN: INTRAVENOUS | Status: DC

## 2011-09-26 MED ORDER — FENTANYL CITRATE 0.05 MG/ML IJ SOLN: 25.0000 ug | INTRAMUSCULAR | Status: DC | PRN

## 2011-09-26 MED ORDER — KETAMINE HCL 50 MG/ML IJ SOLN
INTRAMUSCULAR | Status: DC | PRN
  Administered 2011-09-26: 50 mg via INTRAMUSCULAR

## 2011-09-26 MED ORDER — FENTANYL CITRATE 0.05 MG/ML IJ SOLN
INTRAMUSCULAR | Status: DC | PRN
  Administered 2011-09-26: 100 ug via INTRAVENOUS

## 2011-09-26 MED ORDER — PROPOFOL 10 MG/ML IV EMUL
INTRAVENOUS | Status: DC | PRN
  Administered 2011-09-26: 80 ug/kg/min via INTRAVENOUS

## 2011-09-26 NOTE — Anesthesia Preprocedure Evaluation (Addendum)
Anesthesia Evaluation  Patient identified by MRN, date of birth, ID band Patient awake    Reviewed: Allergy & Precautions, H&P , NPO status , Patient's Chart, lab work & pertinent test results  Airway Mallampati: III TM Distance: >3 FB Neck ROM: full    Dental No notable dental hx. (+) Teeth Intact and Dental Advisory Given   Pulmonary neg pulmonary ROS, shortness of breath and with exertion, sleep apnea and Continuous Positive Airway Pressure Ventilation ,  breath sounds clear to auscultation  Pulmonary exam normal       Cardiovascular hypertension, Pt. on medications Rhythm:regular Rate:Normal  Bifascicular block   Neuro/Psych negative neurological ROS  negative psych ROS   GI/Hepatic negative GI ROS, Neg liver ROS, GERD-  Medicated and Controlled,  Endo/Other  negative endocrine ROSdiabetes, Well Controlled, Type 2, Oral Hypoglycemic AgentsMorbid obesity  Renal/GU negative Renal ROS  negative genitourinary   Musculoskeletal   Abdominal   Peds  Hematology negative hematology ROS (+)   Anesthesia Other Findings   Reproductive/Obstetrics negative OB ROS                         Anesthesia Physical Anesthesia Plan  ASA: III  Anesthesia Plan: MAC   Post-op Pain Management:    Induction:   Airway Management Planned:   Additional Equipment:   Intra-op Plan:   Post-operative Plan:   Informed Consent: I have reviewed the patients History and Physical, chart, labs and discussed the procedure including the risks, benefits and alternatives for the proposed anesthesia with the patient or authorized representative who has indicated his/her understanding and acceptance.   Dental Advisory Given  Plan Discussed with: CRNA and Surgeon  Anesthesia Plan Comments:         Anesthesia Quick Evaluation

## 2011-09-26 NOTE — Anesthesia Postprocedure Evaluation (Signed)
  Anesthesia Post-op Note  Patient: Jason Wyatt  Procedure(s) Performed: Procedure(s) (LRB): ESOPHAGOGASTRODUODENOSCOPY (EGD) (N/A)  Patient Location: PACU  Anesthesia Type: MAC  Level of Consciousness: awake and alert   Airway and Oxygen Therapy: Patient Spontanous Breathing  Post-op Pain: mild  Post-op Assessment: Post-op Vital signs reviewed, Patient's Cardiovascular Status Stable, Respiratory Function Stable, Patent Airway and No signs of Nausea or vomiting  Post-op Vital Signs: stable  Complications: No apparent anesthesia complications

## 2011-09-26 NOTE — Transfer of Care (Signed)
Immediate Anesthesia Transfer of Care Note  Patient: Jason Wyatt  Procedure(s) Performed: Procedure(s) (LRB) with comments: ESOPHAGOGASTRODUODENOSCOPY (EGD) (N/A)  Patient Location: PACU  Anesthesia Type: MAC  Level of Consciousness: awake, alert  and oriented  Airway & Oxygen Therapy: Patient Spontanous Breathing and Patient connected to nasal cannula oxygen  Post-op Assessment: Report given to PACU RN and Post -op Vital signs reviewed and stable  Post vital signs: Reviewed and stable  Complications: No apparent anesthesia complications

## 2011-09-26 NOTE — Op Note (Signed)
[facility] [address] [place], [address]   ENDOSCOPY PROCEDURE REPORT  PATIENT: Jason Wyatt, Jason Wyatt  MR#: [id number] BIRTHDATE: 06-09-1955 , 56  yrs. old GENDER: Male ENDOSCOPIST:Mohmed Farver, MD REFERRED BY:  Dr. Lucky Cowboy, Dr. Peter Swaziland PROCEDURE DATE:  09/26/2011 PROCEDURE:      upper endoscopy with biopsies ASA CLASS: INDICATIONS:   heme positive stool, mild anemia, weight loss, history of Barrett's esophagus MEDICATION:    MAC per anesthesia (ketamine, propofol, fentanyl, Versed) TOPICAL ANESTHETIC:    none  DESCRIPTION OF PROCEDURE:   the patient came as an outpatient to the [facility] long endoscopy unit and provided written consent.  The Pentax adult video endoscope was passed under direct vision. The larynx looked normal. The esophagus was entered under direct vision without significant difficulty.  The esophagus was essentially normal. There was a slightly irregular Z line, without evidence of a single broad tongue of Barrett's mucosa, which was biopsied at the end of the procedure. A 2 cm hiatal hernia was present. The esophagus was otherwise normal, specifically without evidence of reflux esophagitis, neoplasia, stricture, infection, or varices.  The stomach contained no residual. On the posterior wall of the midbody of the stomach were 2 flat slightly hemorrhagic areas with a little bit of exudate, in a pattern suggestive of pill-induced gastritis. There was no mass effect and no frank ulceration. Nonetheless, this area was biopsied a couple of times, with a small to moderate amount of oozing considering the small biopsies. The stomach was otherwise normal, without erosions, ulcers, polyps, or masses, including a retroflexed view of the cardia. There was a small raised area on the floor of the antrum with some erythema at the tip of this raised area, consistent with a focus of inflammation with edema and erythema,but not appearing like  any polyp or mass.  The pylorus, duodenal bulb, and second duodenum were unremarkable.  The patient tolerated the procedure well and there were no apparent complications.     COMPLICATIONS: None  ENDOSCOPIC IMPRESSION:  1. Focal gastritis with hemorrhage, possibly secondary to medication, path pending. Depending on whether this is a recurrent or chronic condition, it might conceivably account for the patient's heme positivity and mild anemia. It would not account for his weight loss, but the patient does indicate that it is typical for his weight to fluctuate quite a bit from time to time.  2. Possible short segment Barrett's esophagus (based on previous biopsies), current path pending    RECOMMENDATIONS:  1. Await pathology results 2. Follow stool Hemoccult status and patient's hemoglobin level, with consideration for colonoscopy if he is persistently heme-positive and/or his hemoglobin does not correct with iron supplementation, keeping in mind that he has had multiple colonoscopies without major abnormalities, most recently 2 years ago.   _______________________________ Rosalie Doctor:  Bernette Redbird, MD 09/26/2011 10:31 AM    PATIENT NAME:  Dashton, Czerwinski MR#: [id number]

## 2011-09-26 NOTE — H&P (Signed)
  56 year old gentleman presents for endoscopic evaluation because of heme positive stool, mild anemia, and weight loss, in the setting of a prior history of short segment Barrett's esophagus.  Past medical history:  Allergies: Lyrica (rash)  Outpatient medications: Metformin, glimepiride, aspirin, enalapril, Lovaza, lansoprazole, Cymbalta, gabapentin, testosterone, allopurinol, tamsulosin, acyclovir, simvastatin, doxycycline  Operations: Neck surgery, and surgery for sleep apnea  Chronic medical illnesses: Morbid obesity, sleep apnea, diabetes, hypertension, Barrett's esophagus, neuropathy  Physical exam: Morbidly obese but very pleasant Caucasian male, no evident pallor. Chest clear. Heart sounds are distant, but normal, without murmur or arrhythmia appreciated. Abdomen morbidly obese, but fairly soft, without specific mass, or any tenderness  Impression: Heme positive stool, mild anemia, weight loss  Plan: Upper endoscopy, with biopsies of Barrett segment and examination for any source of chronic anemia, heme positivity, or weight loss.  Florencia Reasons, M.D. [phone]

## 2011-09-27 ENCOUNTER — Encounter (HOSPITAL_COMMUNITY): Payer: Self-pay | Admitting: Gastroenterology

## 2011-10-04 ENCOUNTER — Other Ambulatory Visit: Payer: Self-pay | Admitting: Internal Medicine

## 2011-10-04 DIAGNOSIS — R413 Other amnesia: Secondary | ICD-10-CM

## 2011-10-04 DIAGNOSIS — R410 Disorientation, unspecified: Secondary | ICD-10-CM

## 2011-10-05 ENCOUNTER — Ambulatory Visit
Admission: RE | Admit: 2011-10-05 | Discharge: 2011-10-05 | Disposition: A | Discharge status: Home / Self Care | Payer: BC Managed Care – PPO | Source: Ambulatory Visit | Attending: Internal Medicine | Admitting: Internal Medicine

## 2011-10-05 DIAGNOSIS — R410 Disorientation, unspecified: Secondary | ICD-10-CM

## 2011-10-05 DIAGNOSIS — R413 Other amnesia: Secondary | ICD-10-CM

## 2011-10-18 ENCOUNTER — Other Ambulatory Visit: Payer: Self-pay | Admitting: Gastroenterology

## 2011-10-18 DIAGNOSIS — R634 Abnormal weight loss: Secondary | ICD-10-CM

## 2011-10-20 ENCOUNTER — Ambulatory Visit
Admission: RE | Admit: 2011-10-20 | Discharge: 2011-10-20 | Disposition: A | Discharge status: Home / Self Care | Payer: BC Managed Care – PPO | Source: Ambulatory Visit | Attending: Gastroenterology | Admitting: Gastroenterology

## 2011-10-20 DIAGNOSIS — R634 Abnormal weight loss: Secondary | ICD-10-CM

## 2011-10-20 MED ORDER — IOHEXOL 300 MG/ML  SOLN
125.0000 mL | Freq: Once | INTRAMUSCULAR | Status: AC | PRN
  Administered 2011-10-20: 125 mL via INTRAVENOUS

## 2011-12-29 ENCOUNTER — Other Ambulatory Visit: Payer: Self-pay | Admitting: Urology

## 2012-01-04 ENCOUNTER — Telehealth: Payer: Self-pay

## 2012-01-04 NOTE — Telephone Encounter (Signed)
Received surgical clearance fax from Dr.Woodruff.Dr.Jordan signed form, ok for patient to hold aspirin 5 days prior to surgery.

## 2012-01-26 ENCOUNTER — Encounter (HOSPITAL_COMMUNITY): Payer: Self-pay | Admitting: Pharmacy Technician

## 2012-01-30 ENCOUNTER — Encounter (HOSPITAL_COMMUNITY)
Admission: RE | Admit: 2012-01-30 | Discharge: 2012-01-30 | Disposition: A | Discharge status: Home / Self Care | Payer: BC Managed Care – PPO | Source: Ambulatory Visit | Attending: Urology | Admitting: Urology

## 2012-01-30 ENCOUNTER — Encounter (HOSPITAL_COMMUNITY): Payer: Self-pay

## 2012-01-30 HISTORY — DX: Malignant neoplasm of prostate: C61

## 2012-01-30 HISTORY — DX: Nontoxic single thyroid nodule: E04.1

## 2012-01-30 LAB — CBC
HCT: 41.5 % (ref 39.0–52.0)
Hemoglobin: 13.9 g/dL (ref 13.0–17.0)
MCH: 29.7 pg (ref 26.0–34.0)
MCHC: 33.5 g/dL (ref 30.0–36.0)
MCV: 88.7 fL (ref 78.0–100.0)
Platelets: 250 10*3/uL (ref 150–400)
RBC: 4.68 MIL/uL (ref 4.22–5.81)
RDW: 17.5 % — ABNORMAL HIGH (ref 11.5–15.5)
WBC: 7.8 10*3/uL (ref 4.0–10.5)

## 2012-01-30 LAB — BASIC METABOLIC PANEL
BUN: 17 mg/dL (ref 6–23)
CO2: 27 mEq/L (ref 19–32)
Calcium: 10 mg/dL (ref 8.4–10.5)
Chloride: 101 mEq/L (ref 96–112)
Creatinine, Ser: 0.99 mg/dL (ref 0.50–1.35)
GFR calc Af Amer: >90 mL/min (ref >90)
GFR calc non Af Amer: 88 mL/min — ABNORMAL LOW (ref >90)
Glucose, Bld: 172 mg/dL — ABNORMAL HIGH (ref 70–99)
Potassium: 4.6 mEq/L (ref 3.5–5.1)
Sodium: 138 mEq/L (ref 135–145)

## 2012-01-30 LAB — ABO/RH: ABO/RH(D): O POS

## 2012-01-30 LAB — SURGICAL PCR SCREEN
MRSA, PCR: NEGATIVE
Staphylococcus aureus: NEGATIVE

## 2012-01-30 NOTE — Patient Instructions (Addendum)
Jason Wyatt Jason Wyatt  01/30/2012                           YOUR PROCEDURE IS SCHEDULED ON: 02/07/12               PLEASE REPORT TO SHORT STAY CENTER AT :  9:00 AM               CALL THIS NUMBER IF ANY PROBLEMS THE DAY OF SURGERY :               [phone]                      REMEMBER:   Do not eat food or drink liquids AFTER MIDNIGHT    Take these medicines the morning of surgery with A SIP OF WATER:  GABAPENTIN / LANSOPRAZOLE / MAY USE NASAL SPRAY IF NEEDED   Do not wear jewelry, make-up   Do not wear lotions, powders, or perfumes.   Do not shave legs or underarms 12 hrs. before surgery (men may shave face)  Do not bring valuables to the hospital.  Contacts, dentures or bridgework may not be worn into surgery.  Leave suitcase in the car. After surgery it may be brought to your room.  For patients admitted to the hospital more than one night, checkout time is 11:00                          The day of discharge.   Patients discharged the day of surgery will not be allowed to drive home                             If going home same day of surgery, must have someone stay with you first                           24 hrs at home and arrange for some one to drive you home from hospital.    Special Instructions:   Please read over the following fact sheets that you were given:               1. MRSA  INFORMATION                      2. [facility] PREPARING FOR SURGERY SHEET                                                X_____________________________________________________________________        Failure to follow these instructions may result in cancellation of your surgery

## 2012-02-07 ENCOUNTER — Encounter (HOSPITAL_COMMUNITY)
Admission: RE | Disposition: A | Discharge status: Home / Self Care | Payer: Self-pay | Source: Ambulatory Visit | Attending: Urology

## 2012-02-07 ENCOUNTER — Encounter (HOSPITAL_COMMUNITY): Payer: Self-pay | Admitting: Anesthesiology

## 2012-02-07 ENCOUNTER — Observation Stay (HOSPITAL_COMMUNITY)
Admission: RE | Admit: 2012-02-07 | Discharge: 2012-02-08 | Disposition: A | Discharge status: Home / Self Care | Payer: BC Managed Care – PPO | Source: Ambulatory Visit | Attending: Urology | Admitting: Urology

## 2012-02-07 ENCOUNTER — Ambulatory Visit (HOSPITAL_COMMUNITY): Payer: BC Managed Care – PPO | Admitting: Anesthesiology

## 2012-02-07 ENCOUNTER — Encounter (HOSPITAL_COMMUNITY): Payer: Self-pay | Admitting: *Deleted

## 2012-02-07 DIAGNOSIS — G4733 Obstructive sleep apnea (adult) (pediatric): Secondary | ICD-10-CM | POA: Insufficient documentation

## 2012-02-07 DIAGNOSIS — Z01812 Encounter for preprocedural laboratory examination: Secondary | ICD-10-CM | POA: Insufficient documentation

## 2012-02-07 DIAGNOSIS — E119 Type 2 diabetes mellitus without complications: Secondary | ICD-10-CM | POA: Insufficient documentation

## 2012-02-07 DIAGNOSIS — I10 Essential (primary) hypertension: Secondary | ICD-10-CM | POA: Insufficient documentation

## 2012-02-07 DIAGNOSIS — Z79899 Other long term (current) drug therapy: Secondary | ICD-10-CM | POA: Insufficient documentation

## 2012-02-07 DIAGNOSIS — C61 Malignant neoplasm of prostate: Principal | ICD-10-CM | POA: Insufficient documentation

## 2012-02-07 DIAGNOSIS — K219 Gastro-esophageal reflux disease without esophagitis: Secondary | ICD-10-CM | POA: Insufficient documentation

## 2012-02-07 HISTORY — PX: ROBOT ASSISTED LAPAROSCOPIC RADICAL PROSTATECTOMY: SHX5141

## 2012-02-07 LAB — BASIC METABOLIC PANEL
BUN: 21 mg/dL (ref 6–23)
CO2: 22 mEq/L (ref 19–32)
Calcium: 8.1 mg/dL — ABNORMAL LOW (ref 8.4–10.5)
Chloride: 99 mEq/L (ref 96–112)
Creatinine, Ser: 1.32 mg/dL (ref 0.50–1.35)
GFR calc Af Amer: 67 mL/min — ABNORMAL LOW (ref >90)
GFR calc non Af Amer: 58 mL/min — ABNORMAL LOW (ref >90)
Glucose, Bld: 236 mg/dL — ABNORMAL HIGH (ref 70–99)
Potassium: 4.6 mEq/L (ref 3.5–5.1)
Sodium: 132 mEq/L — ABNORMAL LOW (ref 135–145)

## 2012-02-07 LAB — CBC
HCT: 38.3 % — ABNORMAL LOW (ref 39.0–52.0)
Hemoglobin: 12.8 g/dL — ABNORMAL LOW (ref 13.0–17.0)
MCH: 30.2 pg (ref 26.0–34.0)
MCHC: 33.4 g/dL (ref 30.0–36.0)
MCV: 90.3 fL (ref 78.0–100.0)
Platelets: 226 10*3/uL (ref 150–400)
RBC: 4.24 MIL/uL (ref 4.22–5.81)
RDW: 17.3 % — ABNORMAL HIGH (ref 11.5–15.5)
WBC: 13.8 10*3/uL — ABNORMAL HIGH (ref 4.0–10.5)

## 2012-02-07 LAB — GLUCOSE, CAPILLARY
Glucose-Capillary: 138 mg/dL — ABNORMAL HIGH (ref 70–99)
Glucose-Capillary: 245 mg/dL — ABNORMAL HIGH (ref 70–99)
Glucose-Capillary: 221 mg/dL — ABNORMAL HIGH (ref 70–99)

## 2012-02-07 LAB — TYPE AND SCREEN
ABO/RH(D): O POS
Antibody Screen: NEGATIVE

## 2012-02-07 SURGERY — ROBOTIC ASSISTED LAPAROSCOPIC RADICAL PROSTATECTOMY LEVEL 1
Anesthesia: General | Site: Prostate | Wound class: Clean Contaminated

## 2012-02-07 MED ORDER — HYOSCYAMINE SULFATE 0.125 MG SL SUBL
0.1250 mg | SUBLINGUAL_TABLET | SUBLINGUAL | Status: DC | PRN
  Filled 2012-02-07: qty 1

## 2012-02-07 MED ORDER — FUROSEMIDE 20 MG PO TABS
20.0000 mg | ORAL_TABLET | ORAL | Status: DC
  Administered 2012-02-07: 20 mg via ORAL
  Filled 2012-02-07: qty 1

## 2012-02-07 MED ORDER — CEFAZOLIN SODIUM 10 G IJ SOLR
3.0000 g | INTRAMUSCULAR | Status: AC
  Administered 2012-02-07 (×2): 3 g via INTRAVENOUS

## 2012-02-07 MED ORDER — PROPOFOL 10 MG/ML IV BOLUS
INTRAVENOUS | Status: DC | PRN
  Administered 2012-02-07: 200 mg via INTRAVENOUS

## 2012-02-07 MED ORDER — ACETAMINOPHEN 10 MG/ML IV SOLN
INTRAVENOUS | Status: DC | PRN
  Administered 2012-02-07: 1000 mg via INTRAVENOUS

## 2012-02-07 MED ORDER — HYDROMORPHONE HCL PF 1 MG/ML IJ SOLN
INTRAMUSCULAR | Status: AC
  Filled 2012-02-07: qty 1

## 2012-02-07 MED ORDER — INSULIN ASPART 100 UNIT/ML ~~LOC~~ SOLN
0.0000 [IU] | Freq: Every day | SUBCUTANEOUS | Status: DC
  Administered 2012-02-07: 5 [IU] via SUBCUTANEOUS

## 2012-02-07 MED ORDER — NEOSTIGMINE METHYLSULFATE 1 MG/ML IJ SOLN
INTRAMUSCULAR | Status: DC | PRN
  Administered 2012-02-07: 4 mg via INTRAVENOUS

## 2012-02-07 MED ORDER — CARBIDOPA-LEVODOPA 25-250 MG PO TABS
1.0000 | ORAL_TABLET | Freq: Two times a day (BID) | ORAL | Status: DC
  Administered 2012-02-07 – 2012-02-08 (×2): 1 via ORAL
  Filled 2012-02-07 (×3): qty 1

## 2012-02-07 MED ORDER — ACETAMINOPHEN 325 MG PO TABS
650.0000 mg | ORAL_TABLET | ORAL | Status: DC | PRN
  Administered 2012-02-08: 650 mg via ORAL
  Filled 2012-02-07: qty 2

## 2012-02-07 MED ORDER — MEPERIDINE HCL 50 MG/ML IJ SOLN: 6.2500 mg | INTRAMUSCULAR | Status: DC | PRN

## 2012-02-07 MED ORDER — HEPARIN SODIUM (PORCINE) 1000 UNIT/ML IJ SOLN
INTRAMUSCULAR | Status: AC
  Filled 2012-02-07: qty 1

## 2012-02-07 MED ORDER — METOCLOPRAMIDE HCL 5 MG/ML IJ SOLN
INTRAMUSCULAR | Status: DC | PRN
  Administered 2012-02-07: 5 mg via INTRAVENOUS

## 2012-02-07 MED ORDER — METFORMIN HCL 500 MG PO TABS
1000.0000 mg | ORAL_TABLET | Freq: Two times a day (BID) | ORAL | Status: DC
  Administered 2012-02-08: 1000 mg via ORAL
  Filled 2012-02-07 (×3): qty 2

## 2012-02-07 MED ORDER — ONDANSETRON HCL 4 MG/2ML IJ SOLN
INTRAMUSCULAR | Status: DC | PRN
  Administered 2012-02-07 (×2): 2 mg via INTRAVENOUS

## 2012-02-07 MED ORDER — BUPIVACAINE LIPOSOME 1.3 % IJ SUSP
20.0000 mL | Freq: Once | INTRAMUSCULAR | Status: AC
  Administered 2012-02-07: 20 mL
  Filled 2012-02-07: qty 20

## 2012-02-07 MED ORDER — HEPARIN SODIUM (PORCINE) 5000 UNIT/ML IJ SOLN
5000.0000 [IU] | INTRAMUSCULAR | Status: AC
  Administered 2012-02-07: 5000 [IU] via SUBCUTANEOUS
  Filled 2012-02-07: qty 1

## 2012-02-07 MED ORDER — STERILE WATER FOR IRRIGATION IR SOLN
Status: DC | PRN
  Administered 2012-02-07: 3000 mL

## 2012-02-07 MED ORDER — ACYCLOVIR 400 MG PO TABS
400.0000 mg | ORAL_TABLET | Freq: Every day | ORAL | Status: DC | PRN
  Filled 2012-02-07: qty 1

## 2012-02-07 MED ORDER — SODIUM CHLORIDE 0.9 % IV SOLN
INTRAVENOUS | Status: DC
  Administered 2012-02-07: via INTRAVENOUS

## 2012-02-07 MED ORDER — INSULIN ASPART 100 UNIT/ML ~~LOC~~ SOLN: 0.0000 [IU] | Freq: Three times a day (TID) | SUBCUTANEOUS | Status: DC

## 2012-02-07 MED ORDER — ONDANSETRON HCL 4 MG/2ML IJ SOLN: 4.0000 mg | INTRAMUSCULAR | Status: DC | PRN

## 2012-02-07 MED ORDER — SODIUM CHLORIDE 0.9 % IV BOLUS (SEPSIS)
1000.0000 mL | Freq: Once | INTRAVENOUS | Status: AC
  Administered 2012-02-07: 1000 mL via INTRAVENOUS

## 2012-02-07 MED ORDER — GLYCOPYRROLATE 0.2 MG/ML IJ SOLN
INTRAMUSCULAR | Status: DC | PRN
  Administered 2012-02-07: .8 mg via INTRAVENOUS

## 2012-02-07 MED ORDER — PROMETHAZINE HCL 25 MG/ML IJ SOLN: 6.2500 mg | INTRAMUSCULAR | Status: DC | PRN

## 2012-02-07 MED ORDER — INSULIN ASPART 100 UNIT/ML ~~LOC~~ SOLN
SUBCUTANEOUS | Status: AC
  Filled 2012-02-07: qty 1

## 2012-02-07 MED ORDER — FLUTICASONE PROPIONATE 50 MCG/ACT NA SUSP: 1.0000 | Freq: Every day | NASAL | Status: DC | PRN

## 2012-02-07 MED ORDER — INDIGOTINDISULFONATE SODIUM 8 MG/ML IJ SOLN
INTRAMUSCULAR | Status: DC | PRN
  Administered 2012-02-07: 5 mL via INTRAVENOUS

## 2012-02-07 MED ORDER — BACIT-POLY-NEO HC 1 % EX OINT
TOPICAL_OINTMENT | Freq: Three times a day (TID) | CUTANEOUS | Status: DC
  Administered 2012-02-07 – 2012-02-08 (×2): via TOPICAL
  Filled 2012-02-07: qty 15

## 2012-02-07 MED ORDER — DULOXETINE HCL 60 MG PO CPEP
60.0000 mg | ORAL_CAPSULE | Freq: Every day | ORAL | Status: DC
  Administered 2012-02-07: 60 mg via ORAL
  Filled 2012-02-07 (×2): qty 1

## 2012-02-07 MED ORDER — CEFAZOLIN SODIUM-DEXTROSE 2-3 GM-% IV SOLR
INTRAVENOUS | Status: AC
  Filled 2012-02-07: qty 50

## 2012-02-07 MED ORDER — CEFAZOLIN SODIUM 1-5 GM-% IV SOLN
INTRAVENOUS | Status: AC
  Filled 2012-02-07: qty 50

## 2012-02-07 MED ORDER — FENTANYL CITRATE 0.05 MG/ML IJ SOLN
INTRAMUSCULAR | Status: DC | PRN
  Administered 2012-02-07: 25 ug via INTRAVENOUS
  Administered 2012-02-07: 100 ug via INTRAVENOUS
  Administered 2012-02-07: 50 ug via INTRAVENOUS
  Administered 2012-02-07: 100 ug via INTRAVENOUS
  Administered 2012-02-07: 25 ug via INTRAVENOUS
  Administered 2012-02-07: 100 ug via INTRAVENOUS
  Administered 2012-02-07 (×2): 50 ug via INTRAVENOUS

## 2012-02-07 MED ORDER — CISATRACURIUM BESYLATE (PF) 10 MG/5ML IV SOLN
INTRAVENOUS | Status: DC | PRN
Start: 2012-02-07 — End: 2012-02-07
  Administered 2012-02-07: 8 mg via INTRAVENOUS
  Administered 2012-02-07: 4 mg via INTRAVENOUS
  Administered 2012-02-07 (×3): 2 mg via INTRAVENOUS
  Administered 2012-02-07 (×2): 4 mg via INTRAVENOUS
  Administered 2012-02-07 (×3): 2 mg via INTRAVENOUS
  Administered 2012-02-07: 4 mg via INTRAVENOUS

## 2012-02-07 MED ORDER — EPHEDRINE SULFATE 50 MG/ML IJ SOLN
INTRAMUSCULAR | Status: DC | PRN
  Administered 2012-02-07: 7.5 mg via INTRAVENOUS

## 2012-02-07 MED ORDER — 0.9 % SODIUM CHLORIDE (POUR BTL) OPTIME
TOPICAL | Status: DC | PRN
  Administered 2012-02-07: 1000 mL

## 2012-02-07 MED ORDER — GABAPENTIN 300 MG PO CAPS
300.0000 mg | ORAL_CAPSULE | Freq: Three times a day (TID) | ORAL | Status: DC
  Administered 2012-02-07 – 2012-02-08 (×2): 300 mg via ORAL
  Filled 2012-02-07 (×4): qty 1

## 2012-02-07 MED ORDER — SODIUM CHLORIDE 0.9 % IR SOLN
Status: DC | PRN
  Administered 2012-02-07: 1000 mL

## 2012-02-07 MED ORDER — ACETAMINOPHEN 10 MG/ML IV SOLN
INTRAVENOUS | Status: AC
  Filled 2012-02-07: qty 100

## 2012-02-07 MED ORDER — INDIGOTINDISULFONATE SODIUM 8 MG/ML IJ SOLN
INTRAMUSCULAR | Status: AC
  Filled 2012-02-07: qty 10

## 2012-02-07 MED ORDER — LACTATED RINGERS IV SOLN
INTRAVENOUS | Status: DC | PRN
  Administered 2012-02-07: 14:00:00

## 2012-02-07 MED ORDER — HYDROMORPHONE HCL PF 1 MG/ML IJ SOLN
0.2500 mg | INTRAMUSCULAR | Status: DC | PRN
  Administered 2012-02-07 (×2): 0.5 mg via INTRAVENOUS

## 2012-02-07 MED ORDER — PANTOPRAZOLE SODIUM 40 MG PO TBEC
40.0000 mg | DELAYED_RELEASE_TABLET | Freq: Every day | ORAL | Status: DC
  Administered 2012-02-08: 40 mg via ORAL
  Filled 2012-02-07: qty 1

## 2012-02-07 MED ORDER — DEXAMETHASONE SODIUM PHOSPHATE 10 MG/ML IJ SOLN
INTRAMUSCULAR | Status: DC | PRN
  Administered 2012-02-07: 10 mg via INTRAVENOUS

## 2012-02-07 MED ORDER — MIDAZOLAM HCL 5 MG/5ML IJ SOLN
INTRAMUSCULAR | Status: DC | PRN
  Administered 2012-02-07: 2 mg via INTRAVENOUS

## 2012-02-07 MED ORDER — BUPIVACAINE-EPINEPHRINE PF 0.25-1:200000 % IJ SOLN
INTRAMUSCULAR | Status: AC
  Filled 2012-02-07: qty 30

## 2012-02-07 MED ORDER — OXYCODONE HCL 5 MG PO TABS: 5.0000 mg | ORAL_TABLET | ORAL | Status: DC | PRN

## 2012-02-07 MED ORDER — MORPHINE SULFATE 2 MG/ML IJ SOLN: 2.0000 mg | INTRAMUSCULAR | Status: DC | PRN

## 2012-02-07 MED ORDER — CEFAZOLIN SODIUM-DEXTROSE 2-3 GM-% IV SOLR
2.0000 g | Freq: Three times a day (TID) | INTRAVENOUS | Status: AC
  Administered 2012-02-07 – 2012-02-08 (×2): 2 g via INTRAVENOUS
  Filled 2012-02-07 (×2): qty 50

## 2012-02-07 MED ORDER — GLIMEPIRIDE 4 MG PO TABS
4.0000 mg | ORAL_TABLET | Freq: Every day | ORAL | Status: DC
  Administered 2012-02-07: 4 mg via ORAL
  Filled 2012-02-07 (×2): qty 1

## 2012-02-07 MED ORDER — LACTATED RINGERS IV SOLN: INTRAVENOUS | Status: DC

## 2012-02-07 MED ORDER — LACTATED RINGERS IV SOLN
INTRAVENOUS | Status: DC
  Administered 2012-02-07: 1000 mL via INTRAVENOUS
  Administered 2012-02-07 (×2): via INTRAVENOUS

## 2012-02-07 MED ORDER — SENNOSIDES-DOCUSATE SODIUM 8.6-50 MG PO TABS
1.0000 | ORAL_TABLET | Freq: Two times a day (BID) | ORAL | Status: DC
  Administered 2012-02-07 – 2012-02-08 (×2): 1 via ORAL
  Filled 2012-02-07 (×3): qty 1

## 2012-02-07 MED ORDER — ENALAPRIL MALEATE 20 MG PO TABS
20.0000 mg | ORAL_TABLET | Freq: Two times a day (BID) | ORAL | Status: DC
  Administered 2012-02-07 – 2012-02-08 (×2): 20 mg via ORAL
  Filled 2012-02-07 (×3): qty 1

## 2012-02-07 MED ORDER — HEPARIN SODIUM (PORCINE) 5000 UNIT/ML IJ SOLN
5000.0000 [IU] | Freq: Three times a day (TID) | INTRAMUSCULAR | Status: DC
  Administered 2012-02-08: 5000 [IU] via SUBCUTANEOUS
  Filled 2012-02-07 (×4): qty 1

## 2012-02-07 MED ORDER — SUCCINYLCHOLINE CHLORIDE 20 MG/ML IJ SOLN
INTRAMUSCULAR | Status: DC | PRN
  Administered 2012-02-07: 100 mg via INTRAVENOUS

## 2012-02-07 MED ORDER — ACETAMINOPHEN 10 MG/ML IV SOLN
1000.0000 mg | Freq: Four times a day (QID) | INTRAVENOUS | Status: DC
  Administered 2012-02-07 – 2012-02-08 (×2): 1000 mg via INTRAVENOUS
  Filled 2012-02-07 (×4): qty 100

## 2012-02-07 SURGICAL SUPPLY — 60 items
APPLIER CLIP 5 13 M/L LIGAMAX5 (MISCELLANEOUS)
CANISTER SUCTION 2500CC (MISCELLANEOUS) ×2 IMPLANT
CATH FOLEY 2WAY SLVR 18FR 30CC (CATHETERS) ×2 IMPLANT
CATH ROBINSON RED A/P 16FR (CATHETERS) ×2 IMPLANT
CATH ROBINSON RED A/P 8FR (CATHETERS) ×2 IMPLANT
CATH TIEMANN FOLEY 18FR 5CC (CATHETERS) ×2 IMPLANT
CHLORAPREP W/TINT 26ML (MISCELLANEOUS) ×4 IMPLANT
CLIP APPLIE 5 13 M/L LIGAMAX5 (MISCELLANEOUS) IMPLANT
CLIP LIGATING HEM O LOK PURPLE (MISCELLANEOUS) ×8 IMPLANT
CLOTH BEACON ORANGE TIMEOUT ST (SAFETY) ×2 IMPLANT
CONT SPEC 4OZ CLIKSEAL STRL BL (MISCELLANEOUS) ×2 IMPLANT
CORD HIGH FREQUENCY UNIPOLAR (ELECTROSURGICAL) ×2 IMPLANT
COVER SURGICAL LIGHT HANDLE (MISCELLANEOUS) ×2 IMPLANT
COVER TIP SHEARS 8 DVNC (MISCELLANEOUS) ×1 IMPLANT
COVER TIP SHEARS 8MM DA VINCI (MISCELLANEOUS) ×1
CUTTER ECHEON FLEX ENDO 45 340 (ENDOMECHANICALS) ×2 IMPLANT
DECANTER SPIKE VIAL GLASS SM (MISCELLANEOUS) ×2 IMPLANT
DRAPE CAMERA CLOSED 9X96 (DRAPES) ×2 IMPLANT
DRAPE LG THREE QUARTER DISP (DRAPES) ×2 IMPLANT
DRAPE SURG IRRIG POUCH 19X23 (DRAPES) ×2 IMPLANT
DRAPE TABLE BACK 44X90 PK DISP (DRAPES) ×2 IMPLANT
DRAPE UTILITY XL STRL (DRAPES) ×2 IMPLANT
DRSG TEGADERM 2-3/8X2-3/4 SM (GAUZE/BANDAGES/DRESSINGS) ×8 IMPLANT
DRSG TEGADERM 4X4.75 (GAUZE/BANDAGES/DRESSINGS) ×4 IMPLANT
DRSG TEGADERM 6X8 (GAUZE/BANDAGES/DRESSINGS) ×6 IMPLANT
DRSG TEGADERM 8X12 (GAUZE/BANDAGES/DRESSINGS) ×2 IMPLANT
ELECT REM PT RETURN 9FT ADLT (ELECTROSURGICAL) ×2
ELECTRODE REM PT RTRN 9FT ADLT (ELECTROSURGICAL) ×1 IMPLANT
GAUZE SPONGE 2X2 8PLY STRL LF (GAUZE/BANDAGES/DRESSINGS) ×1 IMPLANT
GAUZE VASELINE 3X9 (GAUZE/BANDAGES/DRESSINGS) ×2 IMPLANT
GLOVE BIOGEL M 7.0 STRL (GLOVE) ×4 IMPLANT
GLOVE BIOGEL PI IND STRL 7.5 (GLOVE) ×2 IMPLANT
GLOVE BIOGEL PI INDICATOR 7.5 (GLOVE) ×2
GLOVE ECLIPSE 7.0 STRL STRAW (GLOVE) ×4 IMPLANT
GOWN STRL NON-REIN LRG LVL3 (GOWN DISPOSABLE) ×4 IMPLANT
GOWN STRL REIN XL XLG (GOWN DISPOSABLE) ×4 IMPLANT
HEMOSTAT SURGICEL 4X8 (HEMOSTASIS) ×2 IMPLANT
HOLDER FOLEY CATH W/STRAP (MISCELLANEOUS) ×2 IMPLANT
KIT ACCESSORY DA VINCI DISP (KITS) ×1
KIT ACCESSORY DVNC DISP (KITS) ×1 IMPLANT
NDL SAFETY ECLIPSE 18X1.5 (NEEDLE) ×1 IMPLANT
NEEDLE HYPO 18GX1.5 SHARP (NEEDLE) ×1
NS IRRIG 1000ML POUR BTL (IV SOLUTION) ×2 IMPLANT
PACK ROBOT UROLOGY CUSTOM (CUSTOM PROCEDURE TRAY) ×2 IMPLANT
POSITIONER SURGICAL ARM (MISCELLANEOUS) ×2 IMPLANT
RELOAD GREEN ECHELON 45 (STAPLE) ×2 IMPLANT
SCRUB PCMX 4 OZ (MISCELLANEOUS) IMPLANT
SET CYSTO W/LG BORE CLAMP LF (SET/KITS/TRAYS/PACK) ×2 IMPLANT
SET TUBE IRRIG SUCTION NO TIP (IRRIGATION / IRRIGATOR) ×2 IMPLANT
SOLUTION ELECTROLUBE (MISCELLANEOUS) ×2 IMPLANT
SPONGE GAUZE 2X2 STER 10/PKG (GAUZE/BANDAGES/DRESSINGS) ×1
SUT ETHILON 3 0 PS 1 (SUTURE) ×2 IMPLANT
SUT V-LOC BARB 180 2/0GR6 GS22 (SUTURE) ×2
SUT VIC AB 2-0 SH 27 (SUTURE) ×1
SUT VIC AB 2-0 SH 27X BRD (SUTURE) ×1 IMPLANT
SUT VICRYL 0 UR6 27IN ABS (SUTURE) ×6 IMPLANT
SUTURE V-LC BRB 180 2/0GR6GS22 (SUTURE) ×1 IMPLANT
SYR 27GX1/2 1ML LL SAFETY (SYRINGE) ×2 IMPLANT
TOWEL OR NON WOVEN STRL DISP B (DISPOSABLE) ×2 IMPLANT
WATER STERILE IRR 1500ML POUR (IV SOLUTION) ×4 IMPLANT

## 2012-02-07 NOTE — Op Note (Addendum)
DATE OF PROCEDURE: 02/07/12   OPERATIVE REPORT  SURGEON: Natalia Leatherwood, MD  ASSISTANT:  Pecola Leisure, PA  PREOPERATIVE DIAGNOSIS: Prostate cancer.  POSTOPERATIVE DIAGNOSIS: Prostate cancer.  PROCEDURE PERFORMED: Robotic-assisted laparoscopic radical  prostatectomy,bilateral Non-nerve sparing approach.  ESTIMATED BLOOD LOSS: 350 cc.  DRAINS: Jackson-Pratt drain, left lower quadrant and Foley catheter.  SPECIMEN: Prostate and seminal vesicles sent separately. Frozen sections of the right prostatic pedicle sent intraoperatively. FINDINGS:  Poor surgical plane between the neurovascular bundles of the prostate bilaterally. Adherence of the right posterior prostatic pedicle to the bladder and rectum. [name]  HISTORY OF PRESENT ILLNESS: 57 year old male diagnosed with prostate cancer. He was Gleason 3+3 equals 6 with positive perineural invasion in one core on the right base. After evaluation and discussion of management options, he elected for surgical extirpation of his prostate.   PROCEDURE IN DETAIL: Informed consent was obtained. He received subcutaneous heparin for DVT prophylaxis before being taken to the OR. The patient was taken to the operating room, where he was placed in the supine position. IV antibiotics were infused and general anesthesia was induced. A time- out was performed, in which the correct patient, surgical site, and procedure were identified and agreed upon by the team. Hair was removed  from his abdomen and genitals and then he was placed in a dorsal  lithotomy position. All pertinent neurovascular pressure points were padded appropriately. His arms were tucked to the side using gel padding. He was then placed in a Trendelenburg position. After this, his abdomen and genitals were prepped and draped in the usual sterile fashion. A Foley catheter was placed on the field, 10 cc of sterile water was placed into the balloon.   Access was gained for laparoscopic procedure by using  the Hasson technique by cutting down to the fascia in the midline below the umbilicus as I was concerned I would not be able to reach adequately into his pelvis in the future.. Once the peritoneum was accessed, a 10 mm  port was placed and abdomen was insufflated with carbon dioxide. Opening pressures were initially low so insufflation was continued. Next, markings were made for placement of the 1st & 3rd robotic arm ports in the usual areas with the ports being placed approximately 10 cm from the midline on either  side of camera (2nd) arm.  A 10 mm assistant port was placed on the far right lateral side of the abdomen. A 5 mm assistant port was placed between the right robotic (1st arm) and the camera port (2nd arm). The 4th robotic arm port was placed at the far left lateral side of the abdomen. All these were placed under direct visualization.   Before the robot was docked. I reflected the bladder off the anterior wall of the abdomen using monopolar scissors and a grasper and a 10 mm 0 laparoscope as I was inferior to the umbilicus. I incised the median umbilical ligament and dissected down to just above the level of the internal iliac rings bilaterally. The robot was docked to the robotic ports. The bladder was taken down to  the endopelvic fascia bilaterally and the perineum was split down to the  vas deferens bilaterally.  After this was done, the prograsp was used to  retract the bladder cephalad. After this was done, the endopelvic fascia was incised  bilaterally and carried anterior and posteriorly bilaterally. The  puboprostatic ligaments were then divided and then the stapler device  was used to divide and ligate the dorsal venous  complex.   After this was complete, attention was turned back to the junction between the prostate and bladder neck.  Dissection was carried down between the prostate and the bladder until  the Foley catheter was encountered. It was deflated and then placed    through the dissection site and then anterior traction was placed by  grasping the catheter with the 4th robot arm. The remainder of the bladder neck  was then dissected out. Both ureter orifices were seen to be well away from the bladder neck and they were both lateral. Dissection was carried down posteriorly making sure to avoid reentry into the bladder until the seminal vesicles were encountered.   The seminal vesicles and vas deferens were then dissected out completely bilaterally, but they tore during retraction and were sent separately for permanent pathology. After  these were done the stumps of the vas deferens were grasped, they were placed on traction anteriorly and blunt  dissection was carried out between the rectum and the prostate. There was noted to be a good surgical plane in the median position, but as dissection was carried out to the lateral positions, there was more adherence of the prostate.  I attempted to perform nerve sparing bilaterally, but there was not a good surgical plane and given the concern for perineural invasion I felt a wide dissection would be more appropriately carried out. The pedicles of the prostate were isolated and ligated with Weck clips and sharp scissors. It was noted that the right posterior and lateral pedicle was very adherent and it was difficult to ascertain the plane between the rectum and the prostate. I therefore divided along the lateral edge of the right prostate leaving some tissue adherent to the rectum. I then ligated and divided the left pedicle with wet clips and sharp scissors. After this was done attention was turned to the urethra. There was a small amount of dorsal venous complex remaining and this was controlled with monopolar electrocautery. The urethra was then incised with scissors, the catheter was identified, and in the posterior urethra was divided. The prostate specimen was then placed in an Endo Catch bag. It should be noted that  during this dissection an additional 12 mm assistant port was placed caudally to the regional right lateral assistant port in order to facilitate the clip appliers being able to reach the pedicles due to his deep pelvis.  Attention was turned back to the area where the right prostatic pedicle was located. I was able to dissect out the plane between the rectum and the prostate. I sent frozen sections for margins which were negative for cancer. I used Weck clips and sharp scissors as well as one figure-of-eight Vicryl suture to control the pedicle.   The pelvis was irrigated and flushing of air into the rectal catheter showed no air bubbles.    After this, anastomosis of the bladder neck to the urethra was carried out. A 2-0 V-lock suture was used to anchor the posterior bladder fascia to the tissue superior to the rectum and inferior to the urethra. Double-armed Monocryl suture was used in a running  fashion to perform the anastomosis. After this was done, a Foley  catheter was placed through the anastomosis and into the bladder. It was irrigated and found to be  water tight. It was tied down and the suture needles were removed from the  body. The catheter balloon was inflated with 10 cc of sterile water. A piece of surgicel was place over the  remnant of the DVC as there was a small amount of oozing, and I felt placing a suture would result in worsening of bleeding.  A Jackson-Pratt drain was placed through the port of the 4th robot arm. The specimen was placed in the EndoCatch bag. The robot was  undocked and the operating table was leveled. The EndoCatch bag was removed from the body by enlarging  the midline of the umbilicus. All ports were checked and found to be  free of bleeding. The 10 mm assistant ports were closed with a suture  passer under direct visualization. The fascia of the inferior umbilical port was close with interrupted vicryl suture in a figure-of-eight fashion until there was no  defect palpable. Care was taken to avoid incorporation of intra-abdominal contents into the closure. After this was done, the drain was sutured into place, and the local injection with Exparel was performed. All wounds were irrigated with  sterile normal saline, subcutaneous tissue was closed with interrupted Vicryl sutures, and the skin was closed with staples and a sterile dressing.  Tegaderm and drain gauze was placed over the Jackson-Pratt drain and  Foley catheter was placed to closed drainage. The patient was placed  back in supine position. Anesthesia was reversed. He was taken to PACU  in stable condition. He will be kept overnight for evaluation.

## 2012-02-07 NOTE — Anesthesia Postprocedure Evaluation (Signed)
Anesthesia Post Note  Patient: Jason Wyatt  Procedure(s) Performed: Procedure(s) (LRB): ROBOTIC ASSISTED LAPAROSCOPIC RADICAL PROSTATECTOMY LEVEL 1 (N/A)  Anesthesia type: General  Patient location: PACU  Post pain: Pain level controlled  Post assessment: Post-op Vital signs reviewed  Last Vitals: BP 133/72  Pulse 102  Temp 36.5 C  Resp 14  SpO2 99%  Post vital signs: Reviewed  Level of consciousness: sedated  Complications: No apparent anesthesia complications

## 2012-02-07 NOTE — H&P (Signed)
Urology History and Physical Exam  CC: Prostate cancer  HPI: 57 year old male presents for surgical removal of his prostate due to prostate cancer. This was discovered in November 2013. It was associated with an elevated PSA to 3.48. Biopsy revealed 3+3=6 in 20% of one core at the right base with positive perineural invasion. His prostate was 55 cc on transrectal [place]. We have reviewed management options and he has elected to proceed with robotic radical prostatectomy; we have discussed the risks, benefits, alternatives, and likelihood if achieving his goals.  PMH: Past Medical History  Diagnosis Date  . Varicose veins   . Gout   . Hypertension   . Morbid obesity   . Complication of anesthesia 06/2010    "woke up next day w/ventilator on"  . High cholesterol   . Peripheral neuropathy   . Exertional dyspnea 08/03/11  . OSA (obstructive sleep apnea)     "use CPAP"  . Type II diabetes mellitus   . GERD (gastroesophageal reflux disease)   . Arthritis     "in my back"  . Chronic UTI (urinary tract infection) 08/03/11    "last month or so"  . S/P cardiac catheterization 08/25/11    normal coronary anatomy; normal LV function; normal right heart pressures  . Prostate cancer   . Anemia   . Thyroid nodule     PSH: Past Surgical History  Procedure Date  . Posterior fusion cervical spine 06/2010    "put rods and screws in"  . Uvulopalatopharyngoplasty, tonsillectomy and septoplasty 1990's  . Tonsillectomy and adenoidectomy 1990's    "for sleep apnea"  . Esophagogastroduodenoscopy 09/26/2011    Procedure: ESOPHAGOGASTRODUODENOSCOPY (EGD);  Surgeon: Florencia Reasons, MD;  Location: [facility];  Service: Endoscopy;  Laterality: N/A;    Allergies: Allergies  Allergen Reactions  . Lyrica (Pregabalin) Itching and Rash    Medications: No prescriptions prior to admission     Social History: History   Social History  . Marital Status: Married    Spouse Name: N/A    Number of  Children: N/A  . Years of Education: N/A   Occupational History  . Not on file.   Social History Main Topics  . Smoking status: Never Smoker   . Smokeless tobacco: Never Used  . Alcohol Use: No  . Drug Use: No  . Sexually Active: Yes   Other Topics Concern  . Not on file   Social History Narrative  . No narrative on file    Family History: Family History  Problem Relation Age of Onset  . Hypertension Mother   . Cancer Father   . Hyperlipidemia Sister   . Hypertension Sister     Review of Systems: Positive: Loose stools (from bowel prep) Negative: Chest pain, SOB, or fever.  A further 10 point review of systems was negative except what is listed in the HPI.  Physical Exam: Filed Vitals:   02/07/12 0843  BP: 126/76  Pulse: 95  Temp: 97.6 F (36.4 C)  Resp: 20    General: No acute distress.  Awake. Head:  Normocephalic.  Atraumatic. ENT:  EOMI.  Mucous membranes moist Neck:  Supple.  No lymphadenopathy. CV:  S1 present. S2 present. Regular rate. Pulmonary: Equal effort bilaterally.  Clear to auscultation bilaterally. Abdomen: Soft.  Non- tender to palpation. Skin:  Normal turgor.  No visible rash. Extremity: No gross deformity of bilateral upper extremities.  No gross deformity of    bilateral lower extremities. Neurologic: Alert. Appropriate  mood.    Studies:  No results found for this basename: HGB:2,WBC:2,PLT:2 in the last 72 hours  No results found for this basename: NA:2,K:2,CL:2,CO2:2,BUN:2,CREATININE:2,CALCIUM:2,MAGNESIUM:2,GFRNONAA:2,GFRAA:2 in the last 72 hours   No results found for this basename: PT:2,INR:2,APTT:2 in the last 72 hours   No components found with this basename: ABG:2    Assessment:  Prostate cancer  Plan: To OR for robot assisted radical prostatectomy with nerve-sparing to be determined at the time of surgery.

## 2012-02-07 NOTE — Preoperative (Signed)
Beta Blockers   Reason not to administer Beta Blockers:Not Applicable 

## 2012-02-07 NOTE — Anesthesia Preprocedure Evaluation (Addendum)
Anesthesia Evaluation  Patient identified by MRN, date of birth, ID band Patient awake    Reviewed: Allergy & Precautions, H&P , NPO status , Patient's Chart, lab work & pertinent test results  Airway Mallampati: III TM Distance: >3 FB Neck ROM: full    Dental No notable dental hx. (+) Dental Advisory Given   Pulmonary neg pulmonary ROS, shortness of breath and with exertion, sleep apnea and Continuous Positive Airway Pressure Ventilation ,  breath sounds clear to auscultation  Pulmonary exam normal       Cardiovascular hypertension, Pt. on medications - Peripheral Vascular Disease Rhythm:regular Rate:Normal  Bifascicular block  Echo 05/2011 - Left ventricle: Wall thickness was increased in a pattern   of mild LVH. Systolic function was normal. The estimated   ejection fraction was in the range of 55% to 60%. - Right ventricle: The cavity size was mildly dilated. - Atrial septum: No defect or patent foramen ovale was   identified.    Neuro/Psych negative neurological ROS  negative psych ROS   GI/Hepatic negative GI ROS, Neg liver ROS, GERD-  Medicated and Controlled,  Endo/Other  negative endocrine ROSdiabetes, Well Controlled, Type 2, Oral Hypoglycemic AgentsMorbid obesity  Renal/GU Renal InsufficiencyRenal diseasenegative Renal ROS  negative genitourinary   Musculoskeletal   Abdominal (+) + obese,   Peds  Hematology negative hematology ROS (+)   Anesthesia Other Findings   Reproductive/Obstetrics negative OB ROS                          Anesthesia Physical  Anesthesia Plan  ASA: III  Anesthesia Plan: General   Post-op Pain Management:    Induction: Intravenous  Airway Management Planned: Oral ETT  Additional Equipment:   Intra-op Plan:   Post-operative Plan: Extubation in OR  Informed Consent: I have reviewed the patients History and Physical, chart, labs and discussed the  procedure including the risks, benefits and alternatives for the proposed anesthesia with the patient or authorized representative who has indicated his/her understanding and acceptance.   Dental advisory given  Plan Discussed with: CRNA  Anesthesia Plan Comments: (History of post op ventilation s/p cervical surgery)      Anesthesia Quick Evaluation

## 2012-02-07 NOTE — Transfer of Care (Signed)
Immediate Anesthesia Transfer of Care Note  Patient: Jason Wyatt  Procedure(s) Performed: Procedure(s) (LRB) with comments: ROBOTIC ASSISTED LAPAROSCOPIC RADICAL PROSTATECTOMY LEVEL 1 (N/A) -    Patient Location: PACU  Anesthesia Type:General  Level of Consciousness: awake, oriented, patient cooperative, lethargic and responds to stimulation  Airway & Oxygen Therapy: Patient Spontanous Breathing and Patient connected to face mask oxygen  Post-op Assessment: Report given to PACU RN, Post -op Vital signs reviewed and stable and Patient moving all extremities  Post vital signs: Reviewed and stable  Complications: No apparent anesthesia complications

## 2012-02-08 ENCOUNTER — Encounter (HOSPITAL_COMMUNITY): Payer: Self-pay | Admitting: Urology

## 2012-02-08 LAB — BASIC METABOLIC PANEL
BUN: 20 mg/dL (ref 6–23)
CO2: 23 mEq/L (ref 19–32)
Calcium: 8.7 mg/dL (ref 8.4–10.5)
Chloride: 98 mEq/L (ref 96–112)
Creatinine, Ser: 1.22 mg/dL (ref 0.50–1.35)
GFR calc Af Amer: 74 mL/min — ABNORMAL LOW (ref >90)
GFR calc non Af Amer: 64 mL/min — ABNORMAL LOW (ref >90)
Glucose, Bld: 140 mg/dL — ABNORMAL HIGH (ref 70–99)
Potassium: 4.6 mEq/L (ref 3.5–5.1)
Sodium: 133 mEq/L — ABNORMAL LOW (ref 135–145)

## 2012-02-08 LAB — GLUCOSE, CAPILLARY
Glucose-Capillary: 221 mg/dL — ABNORMAL HIGH (ref 70–99)
Glucose-Capillary: 118 mg/dL — ABNORMAL HIGH (ref 70–99)
Glucose-Capillary: 122 mg/dL — ABNORMAL HIGH (ref 70–99)

## 2012-02-08 LAB — HEMOGLOBIN AND HEMATOCRIT, BLOOD
HCT: 39.2 % (ref 39.0–52.0)
Hemoglobin: 13 g/dL (ref 13.0–17.0)

## 2012-02-08 MED ORDER — HYOSCYAMINE SULFATE 0.125 MG PO TABS: 0.1250 mg | ORAL_TABLET | ORAL | Status: DC | PRN

## 2012-02-08 MED ORDER — SENNOSIDES-DOCUSATE SODIUM 8.6-50 MG PO TABS: 1.0000 | ORAL_TABLET | Freq: Two times a day (BID) | ORAL | Status: DC

## 2012-02-08 MED ORDER — CIPROFLOXACIN HCL 500 MG PO TABS: 500.0000 mg | ORAL_TABLET | Freq: Two times a day (BID) | ORAL | Status: DC

## 2012-02-08 MED ORDER — BISACODYL 10 MG RE SUPP: 10.0000 mg | Freq: Two times a day (BID) | RECTAL | Status: DC

## 2012-02-08 MED ORDER — OXYBUTYNIN CHLORIDE 5 MG PO TABS: 5.0000 mg | ORAL_TABLET | Freq: Four times a day (QID) | ORAL | Status: DC | PRN

## 2012-02-08 MED ORDER — PHENOL 1.4 % MT LIQD
1.0000 | OROMUCOSAL | Status: DC | PRN
  Filled 2012-02-08: qty 177

## 2012-02-08 MED ORDER — MENTHOL 3 MG MT LOZG
1.0000 | LOZENGE | OROMUCOSAL | Status: DC | PRN
  Filled 2012-02-08: qty 9

## 2012-02-08 MED ORDER — OXYCODONE-ACETAMINOPHEN 5-325 MG PO TABS: 1.0000 | ORAL_TABLET | ORAL | Status: DC | PRN

## 2012-02-08 MED ORDER — GLIMEPIRIDE 4 MG PO TABS
4.0000 mg | ORAL_TABLET | Freq: Every day | ORAL | Status: DC
  Filled 2012-02-08: qty 1

## 2012-02-08 MED ORDER — BISACODYL 10 MG RE SUPP
10.0000 mg | Freq: Two times a day (BID) | RECTAL | Status: DC
  Administered 2012-02-08: 10 mg via RECTAL
  Filled 2012-02-08: qty 1

## 2012-02-08 NOTE — Care Management Note (Signed)
    Page 1 of 1   02/08/2012     1:58:03 PM   CARE MANAGEMENT NOTE 02/08/2012  Patient:  Jason, Wyatt   Account Number:  [id number]  Date Initiated:  02/08/2012  Documentation initiated by:  Lanier Clam  Subjective/Objective Assessment:   ADMITTED W/PROSTATE CA.     Action/Plan:   FROM HOME.   Anticipated DC Date:  02/08/2012   Anticipated DC Plan:  HOME/SELF CARE      DC Planning Services  CM consult      Choice offered to / List presented to:             Status of service:  Completed, signed off Medicare Important Message given?   (If response is "NO", the following Medicare IM given date fields will be blank) Date Medicare IM given:   Date Additional Medicare IM given:    Discharge Disposition:    Per UR Regulation:  Reviewed for med. necessity/level of care/duration of stay  If discussed at Long Length of Stay Meetings, dates discussed:    Comments:  02/08/12 Nohea Kras RN,BSN NCM [phone] S/P ROBOTIC LAP ASST RADICAL PROSTATECTOMY

## 2012-02-08 NOTE — Progress Notes (Signed)
Urology Progress Note  Subjective:     No acute urologic events overnight. Tolerating food. Negative nausea. Began ambulating last night. Pain well controlled; has only required tylenol. Negative flatus or BM.  Today I reviewed with the patient the surgical findings and the results of the surgery. I explained the procedures performed.  We discussed discharge instructions and I reviewed these in detail. These can be found in written form in the patient's discharge order work.  ROS: Negative: SOB  Objective:  Patient Vitals for the past 24 hrs:  BP Temp Temp src Pulse Resp SpO2 Height Weight  02/08/12 0628 124/68 mmHg 97.6 F (36.4 C) Oral 74  16  98 % - -  02/08/12 0309 124/81 mmHg 97.5 F (36.4 C) Oral 87  16  100 % - -  02/07/12 2142 - - - - - - 6\' 2"  (1.88 m) 166.2 kg (366 lb 6.5 oz)  02/07/12 2118 150/89 mmHg 97.5 F (36.4 C) Oral 103  16  94 % 6' 2.02" (1.88 m) 168 kg (370 lb 6 oz)  02/07/12 2100 133/74 mmHg 97.5 F (36.4 C) - 103  17  99 % - -  02/07/12 2045 133/72 mmHg - - 102  14  99 % - -  02/07/12 2030 132/72 mmHg - - 99  16  99 % - -  02/07/12 2015 112/75 mmHg - - 102  15  98 % - -  02/07/12 2000 140/83 mmHg - - 101  12  98 % - -  02/07/12 1945 142/80 mmHg - - 99  18  100 % - -  02/07/12 1937 141/77 mmHg 97.7 F (36.5 C) - 99  16  100 % - -  02/07/12 0843 126/76 mmHg 97.6 F (36.4 C) - 95  20  96 % - -    Physical Exam: General:  No acute distress, awake Cardiovascular:    [x]   S1/S2 present, RRR  []   Irregularly irregular Chest:  CTA-B Abdomen:               []  Soft, appropriately TTP  []  Soft, NTTP  [x]  Soft, appropriately TTP, incision(s) dressed without soakage, JP serosanguinous.  Genitourinary: Some scrotal fullness, but negative ecchymosis Foley:  Draining clear yellow urine.    I/O last 3 completed shifts: In: 1000 [I.V.:1000] Out: 550 [Other:200; Blood:350] JP 25cc  Recent Labs  [facility] 02/08/12 0525 02/07/12 2107   HGB 13.0 12.8*   WBC  -- 13.8*   PLT -- 226    Recent Labs  Basename 02/08/12 0525 02/07/12 2107   NA 133* 132*   K 4.6 4.6   CL 98 99   CO2 23 22   BUN 20 21   CREATININE 1.22 1.32   CALCIUM 8.7 8.1*   GFRNONAA 64* 58*   GFRAA 74* 67*     No results found for this basename: PT:2,INR:2,APTT:2 in the last 72 hours   No components found with this basename: ABG:2    Length of stay: 1 days.  Assessment: Prostate cancer POD#1 Robot assisted radical prostatectomy.   Plan: -Continue to ambulate. -Dulcolax suppository. -Discontinue telemetry. -Discontinue JP drain. -Likely discharge home today.   Natalia Leatherwood, MD [phone]

## 2012-02-08 NOTE — Progress Notes (Signed)
Set patient up on CPAP with home mask and tubing. Nasal mask with max 20cm H2O, 5cm H2O min. Sterile water added to fill line. Patient comfortable and knows to call RT if he needs any further assistance.

## 2012-02-08 NOTE — Discharge Summary (Signed)
Physician Discharge Summary  Patient ID: NAJEH CREDIT MRN: [id number] DOB/AGE: 59-Jun-1957 59 y.o.  Admit date: 02/07/2012 Discharge date: 02/08/2012  Admission Diagnoses: Prostate cancer  Discharge Diagnoses:  Prostate cancer  Discharged Condition: good  Hospital Course:  This patient was admitted postoperatively following radical robotic assisted prostatectomy with bilateral non-nerve sparing approach for prostate cancer. He was able to ambulate the day of surgery and tolerate food. His pain was well-controlled. His JP drain had a very low volume and this was discontinued the following day. He continued to progress and was able to tolerate a regular diet. He did not pass flatus. We discussed keeping him until he passed flatus or discharging home. He stated he would prefer to go home rather than waiting.  He was taught Foley catheter care and given extensive discharge instructions. He was instructed to remove his surgical dressings on postoperative day #2. He was given ciprofloxacin to begin the day before his catheter removal.  Consults: None  Significant Diagnostic Studies: None  Treatments: surgery: Robot-assisted radical prostatectomy.  Discharge Exam: Blood pressure 124/68, pulse 74, temperature 97.6 F (36.4 C), temperature source Oral, resp. rate 16, height 6\' 2"  (1.88 m), weight 166.2 kg (366 lb 6.5 oz), SpO2 98.00%. Refer to progress note on date of discharge.  Disposition: 01-Home or Self Care     Medication List     As of 02/08/2012  7:29 AM    TAKE these medications         acyclovir 400 MG tablet   Commonly known as: ZOVIRAX   Take 400 mg by mouth daily as needed. For cold sores      allopurinol 300 MG tablet   Commonly known as: ZYLOPRIM   Take 300 mg by mouth at bedtime.      aspirin 81 MG tablet   Take 81 mg by mouth every morning.      bisacodyl 10 MG suppository   Commonly known as: DULCOLAX   Place 1 suppository (10 mg total) rectally 2 (two)  times daily.      carbidopa-levodopa 25-250 MG per tablet   Commonly known as: SINEMET IR   Take 1 tablet by mouth 2 (two) times daily.      ciprofloxacin 500 MG tablet   Commonly known as: CIPRO   Take 1 tablet (500 mg total) by mouth 2 (two) times daily. Begin the day before your catheter is going to be removed. This is an antibiotic.      DULoxetine 60 MG capsule   Commonly known as: CYMBALTA   Take 60 mg by mouth at bedtime.      enalapril 20 MG tablet   Commonly known as: VASOTEC   Take 20 mg by mouth 2 (two) times daily.      ferrous sulfate 325 (65 FE) MG tablet   Take 325 mg by mouth daily with breakfast.      furosemide 40 MG tablet   Commonly known as: LASIX   Take 20 mg by mouth every Monday, Wednesday, and Friday.      gabapentin 300 MG capsule   Commonly known as: NEURONTIN   Take 300 mg by mouth 3 (three) times daily.      glimepiride 4 MG tablet   Commonly known as: AMARYL   Take 4 mg by mouth at bedtime.      hyoscyamine 0.125 MG tablet   Commonly known as: LEVSIN, ANASPAZ   Take 1 tablet (0.125 mg total) by mouth every 4 (four)  hours as needed for cramping (bladder spasms).      lansoprazole 30 MG capsule   Commonly known as: PREVACID   Take 30 mg by mouth daily. Patient uses this medication for heartburn.      Magnesium 250 MG Tabs   Take 250 mg by mouth daily.      metFORMIN 1000 MG tablet   Commonly known as: GLUCOPHAGE   Take 1,000 mg by mouth 2 (two) times daily with a meal.      multivitamin with minerals Tabs   Take 1 tablet by mouth daily.      NASONEX 50 MCG/ACT nasal spray   Generic drug: mometasone   Place 2 sprays into the nose daily as needed. Allergies      omega-3 acid ethyl esters 1 G capsule   Commonly known as: LOVAZA   Take 2 g by mouth 2 (two) times daily.      oxybutynin 5 MG tablet   Commonly known as: DITROPAN   Take 1 tablet (5 mg total) by mouth every 6 (six) hours as needed.      oxyCODONE-acetaminophen 5-325 MG  per tablet   Commonly known as: PERCOCET/ROXICET   Take 1-2 tablets by mouth every 4 (four) hours as needed for pain.      senna-docusate 8.6-50 MG per tablet   Commonly known as: Senokot-S   Take 1 tablet by mouth 2 (two) times daily.      Tamsulosin HCl 0.4 MG Caps   Commonly known as: FLOMAX   Take 0.4 mg by mouth daily.      Vitamin D (Ergocalciferol) 50000 UNITS Caps   Commonly known as: DRISDOL   50,000 Units 4 (four) times a week. Sunday, Tuesday, Thursday and saturday           Follow-up Information    Follow up with Milford Cage, MD. On 02/19/2012. (8:15 am)    Contact information:   [address] [place] FLOOR [address], [place] [address] [phone]          Signed: Milford Cage 02/08/2012, 7:29 AM

## 2012-02-26 ENCOUNTER — Other Ambulatory Visit: Payer: Self-pay | Admitting: Otolaryngology

## 2012-02-26 DIAGNOSIS — D497 Neoplasm of unspecified behavior of endocrine glands and other parts of nervous system: Secondary | ICD-10-CM

## 2012-02-27 ENCOUNTER — Ambulatory Visit
Admission: RE | Admit: 2012-02-27 | Discharge: 2012-02-27 | Disposition: A | Discharge status: Home / Self Care | Payer: BC Managed Care – PPO | Source: Ambulatory Visit | Attending: Otolaryngology | Admitting: Otolaryngology

## 2012-02-27 DIAGNOSIS — D497 Neoplasm of unspecified behavior of endocrine glands and other parts of nervous system: Secondary | ICD-10-CM

## 2012-03-02 ENCOUNTER — Other Ambulatory Visit: Payer: Self-pay

## 2012-05-31 ENCOUNTER — Telehealth: Payer: Self-pay | Admitting: Cardiology

## 2012-05-31 NOTE — Telephone Encounter (Signed)
Pt needs an appt with [place] or lori, nothing for a month or so unless lori comes back, pls advise

## 2012-06-03 ENCOUNTER — Encounter: Payer: Self-pay | Admitting: Internal Medicine

## 2012-06-03 NOTE — Telephone Encounter (Signed)
Returned call to patient he stated he already has appointment with Norma Fredrickson NP 06/04/12.

## 2012-06-04 ENCOUNTER — Ambulatory Visit (INDEPENDENT_AMBULATORY_CARE_PROVIDER_SITE_OTHER): Payer: BC Managed Care – PPO | Admitting: Nurse Practitioner

## 2012-06-04 ENCOUNTER — Encounter: Payer: Self-pay | Admitting: Nurse Practitioner

## 2012-06-04 VITALS — BP 142/90 | HR 80 | Ht 74.0 in | Wt 371.8 lb

## 2012-06-04 DIAGNOSIS — R000 Tachycardia, unspecified: Secondary | ICD-10-CM

## 2012-06-04 NOTE — Patient Instructions (Addendum)
Think about what we have talked about today  Try to be more active  I do not think you need any more medicines  Call the [facility] office at [phone] if you have any questions, problems or concerns.

## 2012-06-04 NOTE — Progress Notes (Signed)
Jason Wyatt Headings Date of Birth: Oct 26, 1955 Medical Record #[id number]  History of Present Illness: [name] is seen back today for a work in visit. Seen for Dr. [place]. He has OSA, morbid obesity, thyroid nodule, mild LVH per echo, sinus tachycardia, gout, HTN, HLD, neuropathy, DM, GERD, OA and chronic UTI's. Had cath back in the fall  Showing normal coronaries, normal right heart pressures, and normal LV function. Filling pressures were low and his diuretics were stopped. Diagnosed with prostate cancer in January of 2014.  Last seen in September. His cardiac status was stable. He was released from our care, to return prn.  Comes back today. He is here with a friend. He is here because of "fast heart beat". Says it was picked up on a past EKG from March - HR was 100. He remains very obese. Very sedentary. No chest pain. Limited by his knees and his weight. Admits to eating constantly and really enjoys food. Not able to exercise. BP has been ok for the most part. Took his medicines late today. No complaints of palpitations. Not dizzy or lightheaded. No syncope. No CAD.    Current Outpatient Prescriptions on File Prior to Visit  Medication Sig Dispense Refill  . acyclovir (ZOVIRAX) 400 MG tablet Take 400 mg by mouth daily as needed. For cold sores      . allopurinol (ZYLOPRIM) 300 MG tablet Take 300 mg by mouth at bedtime.       [name] aspirin 81 MG tablet Take 81 mg by mouth every morning.       . carbidopa-levodopa (SINEMET IR) 25-250 MG per tablet Take 1 tablet by mouth 2 (two) times daily.      . DULoxetine (CYMBALTA) 60 MG capsule Take 60 mg by mouth at bedtime.       . enalapril (VASOTEC) 20 MG tablet Take 20 mg by mouth 2 (two) times daily.       . ferrous sulfate 325 (65 FE) MG tablet Take 325 mg by mouth 2 (two) times daily.       . furosemide (LASIX) 40 MG tablet Take 20 mg by mouth every Monday, Wednesday, and Friday.      . gabapentin (NEURONTIN) 300 MG capsule Take 300 mg by mouth 3 (three)  times daily.       [name] glimepiride (AMARYL) 4 MG tablet Take 4 mg by mouth at bedtime.       . lansoprazole (PREVACID) 30 MG capsule Take 30 mg by mouth daily. Patient uses this medication for heartburn.      . Magnesium 250 MG TABS Take 250 mg by mouth daily.       . metFORMIN (GLUCOPHAGE) 1000 MG tablet Take 1,000 mg by mouth 2 (two) times daily with a meal.       . Multiple Vitamin (MULTIVITAMIN WITH MINERALS) TABS Take 1 tablet by mouth daily.      [name] NASONEX 50 MCG/ACT nasal spray Place 2 sprays into the nose daily as needed. Allergies      . omega-3 acid ethyl esters (LOVAZA) 1 G capsule Take 2 g by mouth 2 (two) times daily.       . Tamsulosin HCl (FLOMAX) 0.4 MG CAPS Take 0.4 mg by mouth daily.       . Vitamin D, Ergocalciferol, (DRISDOL) 50000 UNITS CAPS 50,000 Units 4 (four) times a week. Sunday, Tuesday, Thursday and saturday       No current facility-administered medications on file prior to visit.  Allergies  Allergen Reactions  . Lyrica (Pregabalin) Itching and Rash    Past Medical History  Diagnosis Date  . Varicose veins   . Gout   . Hypertension   . Morbid obesity   . Complication of anesthesia 06/2010    "woke up next day w/ventilator on"  . High cholesterol   . Peripheral neuropathy   . Exertional dyspnea 08/03/11  . OSA (obstructive sleep apnea)     "use CPAP"  . Type II diabetes mellitus   . GERD (gastroesophageal reflux disease)   . Arthritis     "in my back"  . Chronic UTI (urinary tract infection) 08/03/11    "last month or so"  . S/P cardiac catheterization 08/25/11    normal coronary anatomy; normal LV function; normal right heart pressures  . Prostate cancer   . Anemia   . Thyroid nodule     Past Surgical History  Procedure Laterality Date  . Posterior fusion cervical spine  06/2010    "put rods and screws in"  . Uvulopalatopharyngoplasty, tonsillectomy and septoplasty  1990's  . Tonsillectomy and adenoidectomy  1990's    "for sleep apnea"  .  Esophagogastroduodenoscopy  09/26/2011    Procedure: ESOPHAGOGASTRODUODENOSCOPY (EGD);  Surgeon: Florencia Reasons, MD;  Location: [facility];  Service: Endoscopy;  Laterality: N/A;  . Robot assisted laparoscopic radical prostatectomy  02/07/2012    Procedure: ROBOTIC ASSISTED LAPAROSCOPIC RADICAL PROSTATECTOMY LEVEL 1;  Surgeon: Milford Cage, MD;  Location: [facility];  Service: Urology;  Laterality: N/A;       History  Smoking status  . Never Smoker   Smokeless tobacco  . Never Used    History  Alcohol Use No    Family History  Problem Relation Age of Onset  . Hypertension Mother   . Cancer Father   . Hyperlipidemia Sister   . Hypertension Sister     Review of Systems: The review of systems is per the HPI.  All other systems were reviewed and are negative.  Physical Exam: BP 150/90  Pulse 80  Ht 6\' 2"  (1.88 m)  Wt 371 lb 12.8 oz (168.647 kg)  BMI 47.72 kg/m2 Patient is very pleasant and in no acute distress. He is morbidly obese. Skin is warm and dry. Color is normal.  HEENT is unremarkable. Normocephalic/atraumatic. PERRL. Sclera are nonicteric. Neck is supple. No masses. No JVD. Lungs are clear. Cardiac exam shows a regular rate and rhythm. Abdomen is obese but soft. Extremities are without edema. Gait and ROM are intact. No gross neurologic deficits noted.  LABORATORY DATA: EKG shows sinus rhythm - rate of 80.   Lab Results  Component Value Date   WBC 13.8* 02/07/2012   HGB 13.0 02/08/2012   HCT 39.2 02/08/2012   PLT 226 02/07/2012   GLUCOSE 140* 02/08/2012   ALT 20 08/04/2011   AST 18 08/04/2011   NA 133* 02/08/2012   K 4.6 02/08/2012   CL 98 02/08/2012   CREATININE 1.22 02/08/2012   BUN 20 02/08/2012   CO2 23 02/08/2012   TSH 3.066 08/03/2011   INR 1.0 08/24/2011   HGBA1C 6.1* 08/03/2011    Assessment / Plan: 1. Tachycardia - probably a reflection of being deconditioned. I do not think he needs further testing at this time. He had extensive testing this past  fall.  2. Morbid obesity - I think this is the crux of his issues. Unfortunately, I do not think he is ready to make changes.  3. HTN - repeat BP by me was coming down. Took his medicines late today. Would have him monitor as an outpatient.  See him back prn.   Patient is agreeable to this plan and will call if any problems develop in the interim.   Rosalio Macadamia, RN, ANP-C [facility] [address] [place], [place]  [address]

## 2012-10-29 ENCOUNTER — Ambulatory Visit: Payer: Self-pay | Admitting: Podiatry

## 2012-11-05 ENCOUNTER — Ambulatory Visit: Payer: Self-pay | Admitting: Podiatry

## 2012-11-21 ENCOUNTER — Other Ambulatory Visit: Payer: Self-pay

## 2012-11-26 ENCOUNTER — Other Ambulatory Visit: Payer: Self-pay | Admitting: Internal Medicine

## 2012-11-26 ENCOUNTER — Encounter: Payer: Self-pay | Admitting: Internal Medicine

## 2012-11-26 MED ORDER — LANSOPRAZOLE 30 MG PO CPDR: 30.0000 mg | DELAYED_RELEASE_CAPSULE | Freq: Every day | ORAL | Status: DC

## 2012-11-26 MED ORDER — GABAPENTIN 300 MG PO CAPS: 300.0000 mg | ORAL_CAPSULE | Freq: Three times a day (TID) | ORAL | Status: DC

## 2012-11-26 MED ORDER — GLIMEPIRIDE 4 MG PO TABS: 4.0000 mg | ORAL_TABLET | Freq: Every day | ORAL | Status: DC

## 2012-11-27 ENCOUNTER — Encounter: Payer: Self-pay | Admitting: Emergency Medicine

## 2012-11-27 ENCOUNTER — Ambulatory Visit: Payer: Self-pay | Admitting: Emergency Medicine

## 2012-11-27 VITALS — BP 130/82 | HR 90 | Temp 98.2°F | Resp 18 | Ht 74.0 in | Wt 346.0 lb

## 2012-11-27 DIAGNOSIS — E1159 Type 2 diabetes mellitus with other circulatory complications: Secondary | ICD-10-CM

## 2012-11-27 DIAGNOSIS — L909 Atrophic disorder of skin, unspecified: Secondary | ICD-10-CM

## 2012-11-27 LAB — BASIC METABOLIC PANEL WITH GFR
BUN: 13 mg/dL (ref 6–23)
CO2: 22 mEq/L (ref 19–32)
Calcium: 9.7 mg/dL (ref 8.4–10.5)
Chloride: 105 mEq/L (ref 96–112)
Creat: 1.03 mg/dL (ref 0.50–1.35)
GFR, Est African American: >89 mL/min
GFR, Est Non African American: 79 mL/min
Glucose, Bld: 138 mg/dL — ABNORMAL HIGH (ref 70–99)
Potassium: 4.4 mEq/L (ref 3.5–5.3)
Sodium: 140 mEq/L (ref 135–145)

## 2012-11-27 NOTE — Progress Notes (Signed)
Subjective:    Patient ID: Jason Wyatt, male    DOB: September 17, 1955, 57 y.o.   MRN: [id number]  HPI Comments: 57 yo male presents for mole removal of unhealing sore and recheck of BMP with RX for DM changes. Trying to eat better and lose wt. Doing well overall and no concerns.    Current Outpatient Prescriptions on File Prior to Visit  Medication Sig Dispense Refill  . acyclovir (ZOVIRAX) 400 MG tablet Take 400 mg by mouth daily as needed. For cold sores      . allopurinol (ZYLOPRIM) 300 MG tablet Take 300 mg by mouth at bedtime.       [name] aspirin 81 MG tablet Take 81 mg by mouth every morning.       . carbidopa-levodopa (SINEMET IR) 25-250 MG per tablet Take 1 tablet by mouth 2 (two) times daily.      . colchicine 0.6 MG tablet Take 0.6 mg by mouth 2 (two) times daily.      . Dapagliflozin Propanediol (FARXIGA) 5 MG TABS Take by mouth.      . DULoxetine (CYMBALTA) 60 MG capsule Take 60 mg by mouth at bedtime.       . enalapril (VASOTEC) 20 MG tablet Take 20 mg by mouth 2 (two) times daily.       . ferrous sulfate 325 (65 FE) MG tablet Take 325 mg by mouth 2 (two) times daily.       [name] gabapentin (NEURONTIN) 300 MG capsule Take 1 capsule (300 mg total) by mouth 3 (three) times daily.  90 capsule  3  . lansoprazole (PREVACID) 30 MG capsule Take 1 capsule (30 mg total) by mouth daily. Patient uses this medication for heartburn.  90 capsule  3  . Magnesium 250 MG TABS Take 250 mg by mouth daily.       . metFORMIN (GLUCOPHAGE) 1000 MG tablet Take 1,000 mg by mouth 2 (two) times daily with a meal.       . Multiple Vitamin (MULTIVITAMIN WITH MINERALS) TABS Take 1 tablet by mouth daily.      [name] NASONEX 50 MCG/ACT nasal spray Place 2 sprays into the nose daily as needed. Allergies      . omega-3 acid ethyl esters (LOVAZA) 1 G capsule Take 2 g by mouth 2 (two) times daily.       . Tamsulosin HCl (FLOMAX) 0.4 MG CAPS Take 0.4 mg by mouth daily.       . Vitamin D, Ergocalciferol, (DRISDOL) 50000 UNITS CAPS  50,000 Units 4 (four) times a week. Sunday, Tuesday, Thursday and saturday       No current facility-administered medications on file prior to visit.   ALLERGIES Flaxseed; Lyrica; and Niaspan  Past Medical History  Diagnosis Date  . Varicose veins   . Gout   . Hypertension   . Morbid obesity   . Complication of anesthesia 06/2010    "woke up next day w/ventilator on"  . High cholesterol   . Peripheral neuropathy   . Exertional dyspnea 08/03/11  . OSA (obstructive sleep apnea)     "use CPAP"  . Type II diabetes mellitus   . GERD (gastroesophageal reflux disease)   . Arthritis     "in my back"  . Chronic UTI (urinary tract infection) 08/03/11    "last month or so"  . S/P cardiac catheterization 08/25/11    normal coronary anatomy; normal LV function; normal right heart pressures  . Prostate cancer   .  Anemia   . Thyroid nodule     Review of Systems  Skin: Positive for color change and wound.  All other systems reviewed and are negative.       Objective:   Physical Exam  Nursing note and vitals reviewed. Constitutional: He appears well-developed and well-nourished.  Neck: Normal range of motion.  Cardiovascular: Normal rate, regular rhythm, normal heart sounds and intact distal pulses.   Pulmonary/Chest: Effort normal and breath sounds normal.  Musculoskeletal: Normal range of motion.  Neurological: He is alert.  Skin: Skin is warm and dry.  Upper left back with 4 mm eryth, scaling elevation. Area prepped with alcohol, #10 blade used to perform shave excision with clear borders to be sent to pathology. Guaze, paper tape applied.  Psychiatric: He has a normal mood and affect. His behavior is normal. Judgment and thought content normal.          Assessment & Plan:  1. Dm with RX change check labs continue better diet/ wt loss. 2. Irreg nevi/ hypertrophic skin removal - hygiene explained w/c if any problems

## 2012-11-27 NOTE — Patient Instructions (Signed)
Obesity Obesity is having too much body fat and a body mass index (BMI) of 30 or more. BMI is a number based on your height and weight. The number is an estimate of how much body fat you have. Obesity can happen if you eat more calories than you can burn by exercising or other activity. It can cause major health problems or emergencies.  HOME CARE  Exercise and be active as told by your doctor. Try:  Using stairs when you can.  Parking farther away from store doors.  Gardening, biking, or walking.  Eat healthy foods and drinks that are low in calories. Eat more fruits and vegetables.  Limit fast food, sweets, and snack foods that are made with ingredients that are not natural (processed food).  Eat smaller amounts of food.  Keep a journal and write down what you eat every day. Websites can help with this.  Avoid drinking alcohol. Drink more water and drinks without calories.   Take vitamins and dietary pills (supplements) only as told by your doctor.  Try going to weight-loss support groups or classes to help lessen stress. Dieticians and counselors may also help. GET HELP RIGHT AWAY IF:  You have chest pain or tightness.  You have trouble breathing or feel short of breath.  You feel weak or have loss of feeling (numbness) in your legs.  You feel confused or have trouble talking.  You have sudden changes in your vision. MAKE SURE YOU:  Understand these instructions.  Will watch your condition.  Will get help right away if you are not doing well or get worse. Document Released: 03/27/2011 Document Reviewed: 03/27/2011 [facility] Patient Information 2014 [place], [place]. Excision of Skin Lesions Excision of a skin lesion refers to the removal of a section of skin by making small cuts (incisions) in the skin. This is typically done to remove a cancerous growth (basal cell carcinoma, squamous cell carcinoma, or melanoma) or a noncancerous growth (cyst). It may be done to treat  or prevent cancer or infection. It may also be done to improve cosmetic appearance (removal of mole, skin tag). LET YOUR CAREGIVER KNOW ABOUT:   Allergies to food or medicine.  Medicines taken, including vitamins, herbs, eyedrops, over-the-counter medicines, and creams.  Use of steroids (by mouth or creams).  Previous problems with anesthetics or numbing medicines.  History of bleeding problems or blood clots.  History of any prostheses.  Previous surgery.  Other health problems, including diabetes and kidney problems.  Possibility of pregnancy, if this applies. RISKS AND COMPLICATIONS  Many complications can be managed. With appropriate treatment and rehabilitation, the following complications are very uncommon:  Bleeding.  Infection.  Scarring.  Recurrence of cyst or cancer.  Changes in skin sensation or appearance (discoloration, swelling).  Reaction to anesthesia.  Allergic reaction to surgical materials or ointments.  Damage to nerves, blood vessels, muscles, or other structures.  Continued pain. BEFORE THE PROCEDURE  It is important to follow your caregiver's instructions prior to your procedure to avoid complications. Steps before your procedure may include:  Physical exam, blood tests, other procedures, such as removing a small sample for examination under a microscope (biopsy).  Your caregiver may review the procedure, the anesthesia being used, and what to expect after the procedure with you. You may be asked to:  Stop taking certain medicines, such as blood thinners (including aspirin, clopidogrel, ibuprofen), for several days prior to your procedure.  Take certain medicines.  Stop smoking. It is a good  idea to arrange for a ride home after surgery and to have someone to help you with activities during recovery. PROCEDURE  There are several excision techniques. The type of excision or surgical technique used will depend on your condition, the location  of the lesion, and your overall health. After the lesion is sterilized and a local anesthetic is applied, the following may be performed: Complete surgical excision The area to be removed is marked with a pen. Using a small scalpel and scissors, the surgeon gently cuts around and under the lesion until it is completely removed. The lesion is placed in a special fluid and sent to the lab for examination. If necessary, bleeding will be controlled with a device that delivers heat. The edges of the wound are stitched together and a dressing is applied. This procedure may be performed to treat a cancerous growth or noncancerous cyst or lesion. Surgeons commonly perform an elliptical excision, to minimize scarring. Excision of a cyst The surgeon makes an incision on the cyst. The entire cyst is removed through the incision. The wound may be closed with a suture (stitch). Shave excision During shave excision, the surgeon uses a small blade or loop instrument to shave off the lesion. This may be done to remove a mole or skin tag. The wound is usually left to heal on its own without stitches. Punch excision During punch excision, the surgeon uses a small, round tool (like a cookie cutter) to cut a circle shape out of the skin. The outer edges of the skin are stitched together. This may be done to remove a mole or scar or to perform a biopsy of the lesion. Mohs micrographic surgery During Mohs micrographic surgery, layers of the lesion are removed with a scalpel or loop instrument and immediately examined under a microscope until all of the abnormal or cancerous tissue is removed. This procedure is minimally invasive and ensures the best cosmetic outcome, with removal of as little normal tissue as possible. Mohs is usually done to treat skin cancer, such as basal cell carcinoma or squamous cell carcinoma, particularly on the face and ears. Antibiotic ointment is applied to the surgical area after each of the  procedures listed above, as necessary. AFTER THE PROCEDURE  How well you heal depends on many factors. Most patients heal quite well with proper techniques and self-care. Scarring will lessen over time. HOME CARE INSTRUCTIONS   Take medicines for pain as directed.  Keep the incision area clean, dry, and protected for at least 48 hours. Change dressings as directed.  For bleeding, apply gentle but firm pressure to the wound using a folded towel for 20 minutes. Call your caregiver if bleeding does not stop.  Avoid high-impact exercise and activities until the stitches are removed or the area heals.  Follow your caregiver's instructions to minimize scarring. Avoid sun exposure until the area has healed. Scarring should lessen over time.  Follow up with your caregiver as directed. Removal of stitches within 4 to 14 days may be necessary. Finding out the results of your test Not all test results are available during your visit. If your test results are not back during the visit, make an appointment with your caregiver to find out the results. Do not assume everything is normal if you have not heard from your caregiver or the medical facility. It is important for you to follow up on all of your test results. SEEK MEDICAL CARE IF:   You or your child has an  oral temperature above 102 F (38.9 C).  You develop signs of infection (chills, feeling unwell).  You notice bleeding, pain, discharge, redness, or swelling at the incision site.  You notice skin irregularities or changes in sensation. MAKE SURE YOU:   Understand these instructions.  Will watch your condition.  Will get help right away if you are not doing well or get worse. FOR MORE INFORMATION  American Academy [employer]: www.https://powers.com/ American Academy of Dermatology: InfoExam.si Document Released: 03/29/2009 Document Revised: 03/27/2011 Document Reviewed: 03/29/2009 [facility] Patient Information 2014 [place],  [place].

## 2012-11-28 ENCOUNTER — Ambulatory Visit: Payer: Self-pay | Admitting: Podiatry

## 2012-12-05 ENCOUNTER — Ambulatory Visit (INDEPENDENT_AMBULATORY_CARE_PROVIDER_SITE_OTHER): Payer: BC Managed Care – PPO | Admitting: Podiatry

## 2012-12-05 VITALS — BP 156/85 | HR 73 | Resp 20 | Ht 74.0 in | Wt 340.0 lb

## 2012-12-05 DIAGNOSIS — B351 Tinea unguium: Secondary | ICD-10-CM

## 2012-12-05 DIAGNOSIS — M79609 Pain in unspecified limb: Secondary | ICD-10-CM

## 2012-12-05 NOTE — Progress Notes (Signed)
Jason Wyatt presents today with a chief complaint of painful toenails one through 5 bilateral.  Objective: Pulses remain palpable bilateral. Nails are thick yellow dystrophic clinically mycotic.  Assessment: Pain in limb secondary to onychomycosis 1 through 5 bilateral.  Plan: Debridement nails 1 through 5 bilateral is cover service secondary to pain followup with him in 3 months.

## 2013-01-20 ENCOUNTER — Other Ambulatory Visit: Payer: Self-pay | Admitting: Internal Medicine

## 2013-01-20 MED ORDER — OMEGA-3-ACID ETHYL ESTERS 1 G PO CAPS: 2.0000 g | ORAL_CAPSULE | Freq: Two times a day (BID) | ORAL | Status: DC

## 2013-01-27 ENCOUNTER — Other Ambulatory Visit: Payer: Self-pay | Admitting: Emergency Medicine

## 2013-01-27 MED ORDER — OMEGA-3-ACID ETHYL ESTERS 1 G PO CAPS: 2.0000 g | ORAL_CAPSULE | Freq: Two times a day (BID) | ORAL | Status: DC

## 2013-02-27 ENCOUNTER — Ambulatory Visit (INDEPENDENT_AMBULATORY_CARE_PROVIDER_SITE_OTHER): Payer: BC Managed Care – PPO | Admitting: Podiatry

## 2013-02-27 ENCOUNTER — Encounter: Payer: Self-pay | Admitting: Podiatry

## 2013-02-27 VITALS — BP 127/64 | HR 100 | Resp 12

## 2013-02-27 DIAGNOSIS — M79609 Pain in unspecified limb: Secondary | ICD-10-CM

## 2013-02-27 DIAGNOSIS — B351 Tinea unguium: Secondary | ICD-10-CM

## 2013-02-27 NOTE — Progress Notes (Signed)
Presents today with a chief complaint of painful toenails bilateral.  Objective: Vital signs are stable alert and oriented x3. There is no erythema edema saline is drainage or odor. Pulses are palpable bilateral. Nails are thick yellow dystrophic onychomycotic and painful palpation as well as debridement.  Assessment: Pain in limb secondary to onychomycosis bilateral.  Plan: Debridement of nails in thickness and length as a covered service secondary to pain.

## 2013-04-07 ENCOUNTER — Encounter: Payer: Self-pay | Admitting: Emergency Medicine

## 2013-04-07 ENCOUNTER — Ambulatory Visit (INDEPENDENT_AMBULATORY_CARE_PROVIDER_SITE_OTHER): Payer: BC Managed Care – PPO | Admitting: Emergency Medicine

## 2013-04-07 VITALS — BP 144/100 | HR 76 | Temp 98.2°F | Resp 20 | Ht 73.0 in | Wt 327.0 lb

## 2013-04-07 DIAGNOSIS — Z0000 Encounter for general adult medical examination without abnormal findings: Secondary | ICD-10-CM

## 2013-04-07 DIAGNOSIS — Z125 Encounter for screening for malignant neoplasm of prostate: Secondary | ICD-10-CM

## 2013-04-07 DIAGNOSIS — Z111 Encounter for screening for respiratory tuberculosis: Secondary | ICD-10-CM

## 2013-04-07 DIAGNOSIS — I10 Essential (primary) hypertension: Secondary | ICD-10-CM

## 2013-04-07 DIAGNOSIS — Z79899 Other long term (current) drug therapy: Secondary | ICD-10-CM

## 2013-04-07 DIAGNOSIS — E782 Mixed hyperlipidemia: Secondary | ICD-10-CM

## 2013-04-07 DIAGNOSIS — E669 Obesity, unspecified: Secondary | ICD-10-CM

## 2013-04-07 DIAGNOSIS — E1159 Type 2 diabetes mellitus with other circulatory complications: Secondary | ICD-10-CM

## 2013-04-07 DIAGNOSIS — E559 Vitamin D deficiency, unspecified: Secondary | ICD-10-CM

## 2013-04-07 DIAGNOSIS — Z23 Encounter for immunization: Secondary | ICD-10-CM

## 2013-04-07 DIAGNOSIS — E119 Type 2 diabetes mellitus without complications: Secondary | ICD-10-CM

## 2013-04-07 DIAGNOSIS — Z1212 Encounter for screening for malignant neoplasm of rectum: Secondary | ICD-10-CM

## 2013-04-07 LAB — LIPID PANEL
Cholesterol: 163 mg/dL (ref 0–200)
HDL: 33 mg/dL — ABNORMAL LOW (ref >39)
LDL Cholesterol: 70 mg/dL (ref 0–99)
Total CHOL/HDL Ratio: 4.9 Ratio
Triglycerides: 300 mg/dL — ABNORMAL HIGH (ref <150)
VLDL: 60 mg/dL — ABNORMAL HIGH (ref 0–40)

## 2013-04-07 LAB — MAGNESIUM: Magnesium: 1.8 mg/dL (ref 1.5–2.5)

## 2013-04-07 LAB — BASIC METABOLIC PANEL WITH GFR
BUN: 16 mg/dL (ref 6–23)
CO2: 25 mEq/L (ref 19–32)
Calcium: 10.3 mg/dL (ref 8.4–10.5)
Chloride: 105 mEq/L (ref 96–112)
Creat: 1 mg/dL (ref 0.50–1.35)
GFR, Est African American: >89 mL/min
GFR, Est Non African American: 81 mL/min
Glucose, Bld: 129 mg/dL — ABNORMAL HIGH (ref 70–99)
Potassium: 4.5 mEq/L (ref 3.5–5.3)
Sodium: 140 mEq/L (ref 135–145)

## 2013-04-07 LAB — CBC WITH DIFFERENTIAL/PLATELET
Basophils Absolute: 0 10*3/uL (ref 0.0–0.1)
Basophils Relative: 0 % (ref 0–1)
Eosinophils Absolute: 0.2 10*3/uL (ref 0.0–0.7)
Eosinophils Relative: 2 % (ref 0–5)
HCT: 44.3 % (ref 39.0–52.0)
Hemoglobin: 15.6 g/dL (ref 13.0–17.0)
Lymphocytes Relative: 17 % (ref 12–46)
Lymphs Abs: 1.5 10*3/uL (ref 0.7–4.0)
MCH: 30.8 pg (ref 26.0–34.0)
MCHC: 35.2 g/dL (ref 30.0–36.0)
MCV: 87.4 fL (ref 78.0–100.0)
Monocytes Absolute: 0.5 10*3/uL (ref 0.1–1.0)
Monocytes Relative: 5 % (ref 3–12)
Neutro Abs: 6.9 10*3/uL (ref 1.7–7.7)
Neutrophils Relative %: 76 % (ref 43–77)
Platelets: 257 10*3/uL (ref 150–400)
RBC: 5.07 MIL/uL (ref 4.22–5.81)
RDW: 17.5 % — ABNORMAL HIGH (ref 11.5–15.5)
WBC: 9.1 10*3/uL (ref 4.0–10.5)

## 2013-04-07 LAB — IRON AND TIBC
%SAT: 20 % (ref 20–55)
Iron: 67 ug/dL (ref 42–165)
TIBC: 341 ug/dL (ref 215–435)
UIBC: 274 ug/dL (ref 125–400)

## 2013-04-07 LAB — HEPATIC FUNCTION PANEL
ALT: 42 U/L (ref 0–53)
AST: 36 U/L (ref 0–37)
Albumin: 4.4 g/dL (ref 3.5–5.2)
Alkaline Phosphatase: 71 U/L (ref 39–117)
Bilirubin, Direct: 0.1 mg/dL (ref 0.0–0.3)
Indirect Bilirubin: 0.4 mg/dL (ref 0.2–1.2)
Total Bilirubin: 0.5 mg/dL (ref 0.2–1.2)
Total Protein: 6.9 g/dL (ref 6.0–8.3)

## 2013-04-07 LAB — URIC ACID: Uric Acid, Serum: 4.2 mg/dL (ref 4.0–7.8)

## 2013-04-07 LAB — HEMOGLOBIN A1C
Hgb A1c MFr Bld: 7.3 % — ABNORMAL HIGH (ref <5.7)
Mean Plasma Glucose: 163 mg/dL — ABNORMAL HIGH (ref <117)

## 2013-04-07 NOTE — Patient Instructions (Signed)
Bad carbs also include fruit juice, alcohol, and sweet tea. These are empty calories that do not signal to your brain that you are full.   Please remember the good carbs are still carbs which convert into sugar. So please measure them out no more than 1/2-1 cup of rice, oatmeal, pasta, and beans.  Veggies are however free foods! Pile them on.   I like lean protein at every meal such as chicken, [place], pork chops, cottage cheese, etc. Just do not fry these meats and please center your meal around vegetable, the meats should be a side dish.   No all fruit is created equal. Please see the list below, the fruit at the bottom is higher in sugars than the fruit at the top   We want weight loss that will last so you should lose 1-2 pounds a week.  THAT IS IT! Please pick THREE things a month to change. Once it is a habit check off the item. Then pick another three items off the list to become habits.  If you are already doing a habit on the list GREAT!  Cross that item off! o Don't drink your calories. Ie, alcohol, soda, fruit juice, and sweet tea.  o Drink more water. Drink a glass when you feel hungry or before each meal.  o Eat breakfast - Complex carb and protein (likeDannon light and fit yogurt, oatmeal, fruit, eggs, [place] bacon). o Measure your cereal.  Eat no more than one cup a day. (ie [place]) o Eat an apple a day. o Add a vegetable a day. o Try a new vegetable a month. o Use Pam! Stop using oil or butter to cook. o Don't finish your plate or use smaller plates. o Share your dessert. o Eat sugar free Jello for dessert or frozen grapes. o Don't eat 2-3 hours before bed. o Switch to whole wheat bread, pasta, and brown rice. o Make healthier choices when you eat out. No fries! o Pick baked chicken, NOT fried. o Don't forget to SLOW DOWN when you eat. It is not going anywhere.  o Take the stairs. o Park far away in the parking lot o [employer] (or weights) for 10 minutes while  watching TV. o Walk at work for 10 minutes during break. o Walk outside 1 time a week with your friend, kids, dog, or significant other. o Start a walking group at [place] the mall as much as you can tolerate.  o Keep a food diary. o Weigh yourself daily. o Walk for 15 minutes 3 days per week. o Cook at home more often and eat out less.  If life happens and you go back to old habits, it is okay.  Just start over. You can do it!   If you experience chest pain, get short of breath, or tired during the exercise, please stop immediately and inform your doctor.  Diabetes and Foot Care Diabetes may cause you to have problems because of poor blood supply (circulation) to your feet and legs. This may cause the skin on your feet to become thinner, break easier, and heal more slowly. Your skin may become dry, and the skin may peel and crack. You may also have nerve damage in your legs and feet causing decreased feeling in them. You may not notice minor injuries to your feet that could lead to infections or more serious problems. Taking care of your feet is one of the most important things you can  do for yourself.  HOME CARE INSTRUCTIONS  Wear shoes at all times, even in the house. Do not go barefoot. Bare feet are easily injured.  Check your feet daily for blisters, cuts, and redness. If you cannot see the bottom of your feet, use a mirror or ask someone for help.  Wash your feet with warm water (do not use hot water) and mild soap. Then pat your feet and the areas between your toes until they are completely dry. Do not soak your feet as this can dry your skin.  Apply a moisturizing lotion or petroleum jelly (that does not contain alcohol and is unscented) to the skin on your feet and to dry, brittle toenails. Do not apply lotion between your toes.  Trim your toenails straight across. Do not dig under them or around the cuticle. File the edges of your nails with an emery board or nail  file.  Do not cut corns or calluses or try to remove them with medicine.  Wear clean socks or stockings every day. Make sure they are not too tight. Do not wear knee-high stockings since they may decrease blood flow to your legs.  Wear shoes that fit properly and have enough cushioning. To break in new shoes, wear them for just a few hours a day. This prevents you from injuring your feet. Always look in your shoes before you put them on to be sure there are no objects inside.  Do not cross your legs. This may decrease the blood flow to your feet.  If you find a minor scrape, cut, or break in the skin on your feet, keep it and the skin around it clean and dry. These areas may be cleansed with mild soap and water. Do not cleanse the area with peroxide, alcohol, or iodine.  When you remove an adhesive bandage, be sure not to damage the skin around it.  If you have a wound, look at it several times a day to make sure it is healing.  Do not use heating pads or hot water bottles. They may burn your skin. If you have lost feeling in your feet or legs, you may not know it is happening until it is too late.  Make sure your health care provider performs a complete foot exam at least annually or more often if you have foot problems. Report any cuts, sores, or bruises to your health care provider immediately. SEEK MEDICAL CARE IF:   You have an injury that is not healing.  You have cuts or breaks in the skin.  You have an ingrown nail.  You notice redness on your legs or feet.  You feel burning or tingling in your legs or feet.  You have pain or cramps in your legs and feet.  Your legs or feet are numb.  Your feet always feel cold. SEEK IMMEDIATE MEDICAL CARE IF:   There is increasing redness, swelling, or pain in or around a wound.  There is a red line that goes up your leg.  Pus is coming from a wound.  You develop a fever or as directed by your health care provider.  You notice a  bad smell coming from an ulcer or wound. Document Released: 12/31/1999 Document Revised: 09/04/2012 Document Reviewed: 06/11/2012 [facility] Patient Information 2014 [place]. Diabetes and Exercise Exercising regularly is important. It is not just about losing weight. It has many health benefits, such as:  Improving your overall fitness, flexibility, and endurance.  Increasing your  bone density.  Helping with weight control.  Decreasing your body fat.  Increasing your muscle strength.  Reducing stress and tension.  Improving your overall health. People with diabetes who exercise gain additional benefits because exercise:  Reduces appetite.  Improves the body's use of blood sugar (glucose).  Helps lower or control blood glucose.  Decreases blood pressure.  Helps control blood lipids (such as cholesterol and triglycerides).  Improves the body's use of the hormone insulin by:  Increasing the body's insulin sensitivity.  Reducing the body's insulin needs.  Decreases the risk for heart disease because exercising:  Lowers cholesterol and triglycerides levels.  Increases the levels of good cholesterol (such as high-density lipoproteins [HDL]) in the body.  Lowers blood glucose levels. YOUR ACTIVITY PLAN  Choose an activity that you enjoy and set realistic goals. Your health care provider or diabetes educator can help you make an activity plan that works for you. You can break activities into 2 or 3 sessions throughout the day. Doing so is as good as one long session. Exercise ideas include:  Taking the dog for a walk.  Taking the stairs instead of the elevator.  Dancing to your favorite song.  Doing your favorite exercise with a friend. RECOMMENDATIONS FOR EXERCISING WITH TYPE 1 OR TYPE 2 DIABETES   Check your blood glucose before exercising. If blood glucose levels are greater than 240 mg/dL, check for urine ketones. Do not exercise if ketones are  present.  Avoid injecting insulin into areas of the body that are going to be exercised. For example, avoid injecting insulin into:  The arms when playing tennis.  The legs when jogging.  Keep a record of:  Food intake before and after you exercise.  Expected peak times of insulin action.  Blood glucose levels before and after you exercise.  The type and amount of exercise you have done.  Review your records with your health care provider. Your health care provider will help you to develop guidelines for adjusting food intake and insulin amounts before and after exercising.  If you take insulin or oral hypoglycemic agents, watch for signs and symptoms of hypoglycemia. They include:  Dizziness.  Shaking.  Sweating.  Chills.  Confusion.  Drink plenty of water while you exercise to prevent dehydration or heat stroke. Body water is lost during exercise and must be replaced.  Talk to your health care provider before starting an exercise program to make sure it is safe for you. Remember, almost any type of activity is better than none. Document Released: 03/25/2003 Document Revised: 09/04/2012 Document Reviewed: 06/11/2012 [facility] Patient Information 2014 [place]. Pneumococcal Vaccine, Polyvalent solution for injection What is this medicine? PNEUMOCOCCAL VACCINE, POLYVALENT (NEU mo KOK al vak SEEN, pol ee VEY luhnt) is a vaccine to prevent pneumococcus bacteria infection. These bacteria are a major cause of ear infections, Strep throat infections, and serious pneumonia, meningitis, or blood infections worldwide. These vaccines help the body to produce antibodies (protective substances) that help your body defend against these bacteria. This vaccine is recommended for people 47 years of age and older with health problems. It is also recommended for all adults over 55 years old. This vaccine will not treat an infection. This medicine may be used for other purposes; ask your  health care provider or pharmacist if you have questions. COMMON BRAND NAME(S): Pneumovax 23 What should I tell my health care provider before I take this medicine? They need to know if you have any of these conditions: -  bleeding problems -bone marrow or organ transplant -cancer, Hodgkin's disease -fever -infection -immune system problems -low platelet count in the blood -seizures -an unusual or allergic reaction to pneumococcal vaccine, diphtheria toxoid, other vaccines, latex, other medicines, foods, dyes, or preservatives -pregnant or trying to get pregnant -breast-feeding How should I use this medicine? This vaccine is for injection into a muscle or under the skin. It is given by a health care professional. A copy of Vaccine Information Statements will be given before each vaccination. Read this sheet carefully each time. The sheet may change frequently. Talk to your pediatrician regarding the use of this medicine in children. While this drug may be prescribed for children as young as 35 years of age for selected conditions, precautions do apply. Overdosage: If you think you have taken too much of this medicine contact a poison control center or emergency room at once. NOTE: This medicine is only for you. Do not share this medicine with others. What if I miss a dose? It is important not to miss your dose. Call your doctor or health care professional if you are unable to keep an appointment. What may interact with this medicine? -medicines for cancer chemotherapy -medicines that suppress your immune function -medicines that treat or prevent blood clots like warfarin, enoxaparin, and dalteparin -steroid medicines like prednisone or cortisone This list may not describe all possible interactions. Give your health care provider a list of all the medicines, herbs, non-prescription drugs, or dietary supplements you use. Also tell them if you smoke, drink alcohol, or use illegal drugs. Some  items may interact with your medicine. What should I watch for while using this medicine? Mild fever and pain should go away in 3 days or less. Report any unusual symptoms to your doctor or health care professional. What side effects may I notice from receiving this medicine? Side effects that you should report to your doctor or health care professional as soon as possible: -allergic reactions like skin rash, itching or hives, swelling of the face, lips, or tongue -breathing problems -confused -fever over 102 degrees F -pain, tingling, numbness in the hands or feet -seizures -unusual bleeding or bruising -unusual muscle weakness Side effects that usually do not require medical attention (report to your doctor or health care professional if they continue or are bothersome): -aches and pains -diarrhea -fever of 102 degrees F or less -headache -irritable -loss of appetite -pain, tender at site where injected -trouble sleeping This list may not describe all possible side effects. Call your doctor for medical advice about side effects. You may report side effects to FDA at 1-800-FDA-1088. Where should I keep my medicine? This does not apply. This vaccine is given in a clinic, pharmacy, doctor's office, or other health care setting and will not be stored at home. NOTE: This sheet is a summary. It may not cover all possible information. If you have questions about this medicine, talk to your doctor, pharmacist, or health care provider.  2014, [employer]. (2007-08-09 14:32:37)

## 2013-04-07 NOTE — Progress Notes (Signed)
Subjective:    Patient ID: Jason Wyatt, male    DOB: April 05, 1955, 58 y.o.   MRN: [id number]  HPI Comments: 58 yo pleasant WM CPE and presents for 3 month F/U for Obesity,  HTN, Cholesterol, DM, D. Deficient. He is down 60 + LBs in last year and is trying to continue to lose weight. He is slowly improving his diet. He has not increased exercise due to chronic knee and back pain. He notes recent knee injection with mild improvement on Left and will have back injected Thurs. He is trying to get disability currently. He notes BS/ BP has been good at home. He checks feet routinely and denies skin break down or neuropathy increase.  He denies any recent gout flares and only takes colcrys with flares. He denies any increase in varicose veins or pain with varicose veins. He denies increase in neuropathy symptoms and has evaluation Q 3 months with podiatry. He has noted small sore on top of toe w/o any pain x a couple of days. He denies any obvious trauma to toe.   He has improved urinary symptoms with continued PT with prostatectomy. He still has chronic fatigue despite weight loss and increased activity.   Diabetes  Hyperlipidemia  Hypertension     Medication List       This list is accurate as of: 04/07/13 11:59 PM.  Always use your most recent med list.               acyclovir 400 MG tablet  Commonly known as:  ZOVIRAX  Take 400 mg by mouth daily as needed. For cold sores     allopurinol 300 MG tablet  Commonly known as:  ZYLOPRIM  Take 300 mg by mouth at bedtime.     aspirin 81 MG tablet  Take 81 mg by mouth every morning.     carbidopa-levodopa 25-250 MG per tablet  Commonly known as:  SINEMET IR  Take 1 tablet by mouth 2 (two) times daily.     colchicine 0.6 MG tablet  Take 0.6 mg by mouth 2 (two) times daily.     DULoxetine 60 MG capsule  Commonly known as:  CYMBALTA  Take 60 mg by mouth at bedtime.     enalapril 20 MG tablet  Commonly known as:  VASOTEC  Take 20  mg by mouth 2 (two) times daily.     FARXIGA 5 MG Tabs  Generic drug:  Dapagliflozin Propanediol  Take by mouth.     gabapentin 300 MG capsule  Commonly known as:  NEURONTIN  Take 1 capsule (300 mg total) by mouth 3 (three) times daily.     JANUVIA 100 MG tablet  Generic drug:  sitaGLIPtin  Take 100 mg by mouth daily.     lansoprazole 30 MG capsule  Commonly known as:  PREVACID  Take 1 capsule (30 mg total) by mouth daily. Patient uses this medication for heartburn.     Magnesium 250 MG Tabs  Take 250 mg by mouth daily.     metFORMIN 1000 MG tablet  Commonly known as:  GLUCOPHAGE  Take 1,000 mg by mouth 2 (two) times daily with a meal.     multivitamin with minerals Tabs tablet  Take 1 tablet by mouth daily.     NASONEX 50 MCG/ACT nasal spray  Generic drug:  mometasone  Place 2 sprays into the nose daily as needed. Allergies     omega-3 acid ethyl esters 1 G capsule  Commonly known as:  LOVAZA  Take 2 capsules (2 g total) by mouth 2 (two) times daily.     tamsulosin 0.4 MG Caps capsule  Commonly known as:  FLOMAX  Take 0.4 mg by mouth daily.     Vitamin D (Ergocalciferol) 50000 UNITS Caps capsule  Commonly known as:  DRISDOL  50,000 Units 4 (four) times a week. Sunday, Tuesday, Thursday and saturday       Allergies  Allergen Reactions  . Flaxseed [Linseed Oil] Rash  . Lyrica [Pregabalin] Itching and Rash  . Niaspan [Niacin Er] Rash   Past Medical History  Diagnosis Date  . Varicose veins   . Gout   . Hypertension   . Morbid obesity   . Complication of anesthesia 06/2010    "woke up next day w/ventilator on"  . High cholesterol   . Peripheral neuropathy   . Exertional dyspnea 08/03/11  . OSA (obstructive sleep apnea)     "use CPAP"  . Type II diabetes mellitus   . GERD (gastroesophageal reflux disease)   . Arthritis     "in my back"  . Chronic UTI (urinary tract infection) 08/03/11    "last month or so"  . S/P cardiac catheterization 08/25/11     normal coronary anatomy; normal LV function; normal right heart pressures  . Prostate cancer   . Anemia   . Thyroid nodule    Past Surgical History  Procedure Laterality Date  . Posterior fusion cervical spine  06/2010    "put rods and screws in"  . Uvulopalatopharyngoplasty, tonsillectomy and septoplasty  1990's  . Tonsillectomy and adenoidectomy  1990's    "for sleep apnea"  . Esophagogastroduodenoscopy  09/26/2011    Procedure: ESOPHAGOGASTRODUODENOSCOPY (EGD);  Surgeon: Cleotis Nipper, MD;  Location: [facility];  Service: Endoscopy;  Laterality: N/A;  . Robot assisted laparoscopic radical prostatectomy  02/07/2012    Procedure: ROBOTIC ASSISTED LAPAROSCOPIC RADICAL PROSTATECTOMY LEVEL 1;  Surgeon: Molli Hazard, MD;  Location: [facility];  Service: Urology;  Laterality: N/A;      History  Substance Use Topics  . Smoking status: Never Smoker   . Smokeless tobacco: Never Used  . Alcohol Use: No   Family History  Problem Relation Age of Onset  . Hypertension Mother   . Dementia Mother   . Osteoporosis Mother   . Osteoarthritis Mother   . Cancer Father   . Hyperlipidemia Sister   . Hypertension Sister    MAINTENANCE: Colonoscopy: 2008 EYE: 08/16/12 wnl Dentist:Yearly due to cost FOOT: q 3 months  IMMUNIZATIONS: Tdap:04/03/11 Pneumovax:04/07/13 Zostavax:1/1/7 Influenza: 11/04/12  Patient Care Team: Unk Pinto, MD as PCP - General (Internal Medicine) Max Villa Herb, DPM as Consulting Physician (Podiatry) Jonetta Speak. Event organiser (Dentistry) Webb Laws as Referring Physician (Optometry) Peter M Martinique, MD as Consulting Physician (Cardiology) Cleotis Nipper, MD as Consulting Physician (Gastroenterology) Johnn Hai, MD as Consulting Physician (Orthopedic Surgery) Molli Hazard, MD as Consulting Physician (Urology)    Review of Systems BP 144/100  Pulse 76  Temp(Src) 98.2 F (36.8 C) (Temporal)  Resp 20  Ht 6\' 1"  (1.854 m)  Wt 327 lb (148.326  kg)  BMI 43.15 kg/m2     Objective:   Physical Exam  Nursing note and vitals reviewed. Constitutional: He is oriented to person, place, and time. He appears well-developed and well-nourished.  OBESE, but + 60 lb weight loss over last year  HENT:  Head: Normocephalic and atraumatic.  Right Ear: External ear normal.  Left Ear: External ear normal.  Nose: Nose normal.  Mouth/Throat: No oropharyngeal exudate.  Eyes: Conjunctivae and EOM are normal. Pupils are equal, round, and reactive to light. Right eye exhibits no discharge. Left eye exhibits no discharge. No scleral icterus.  Neck: Normal range of motion. Neck supple. No JVD present. No tracheal deviation present. No thyromegaly present.  Cardiovascular: Normal rate, regular rhythm, normal heart sounds and intact distal pulses.   Varicose veins bilateral LE soft NT ND  Pulmonary/Chest: Effort normal and breath sounds normal.  Abdominal: Soft. Bowel sounds are normal. He exhibits no distension and no mass. There is no tenderness. There is no rebound and no guarding.  Genitourinary:  Def Urology  Musculoskeletal: Normal range of motion. He exhibits no edema and no tenderness.  Lymphadenopathy:    He has no cervical adenopathy.  Neurological: He is alert and oriented to person, place, and time. He has normal reflexes. A cranial nerve deficit is present. He exhibits normal muscle tone. Coordination normal.  Decreased sensation bil feet, no change  Skin: Skin is warm and dry. No rash noted. No erythema. No pallor.  Dry scaling heels, thick toenails, 2nd left toe with callus at base and superficial excoriation at nail bed, base of 3rd right toe callus  Psychiatric: He has a normal mood and affect. His behavior is normal. Judgment and thought content normal.      AORTA SCAN WNL EKG NSCSPT     Assessment & Plan:  1. CPE- Update screening labs/ History/ Immunizations/ Testing as needed. Advised healthy diet, QD exercise, increase H20  and continue RX/ Vitamins AD.  2. 3 month F/U for Obesity,  HTN, Cholesterol,DM, D. Deficient. Needs healthy diet, cardio QD and obtain healthy weight. Check Labs, Check BP if >130/80 call office, Check BS if >200 call office. SX Januvia 100mg  AD # 28, SX Farxiga 5 mg # 28 AD. Callus/ Bunion/ dry skin DM- Advised needs podiatry evaluation with  DM Hx, epsom salt soaks, vaseline treatment QD, monitor QD and call with any concerns/ adverse changes. Sore on foot continue to monitor if no change f/u podiatry ASAP  3. Knee/ back pain- Continue f/u at ortho and continue to increase exercise and weight loss

## 2013-04-08 LAB — MICROALBUMIN / CREATININE URINE RATIO
Creatinine, Urine: 130.7 mg/dL
Microalb Creat Ratio: 14.8 mg/g (ref 0.0–30.0)
Microalb, Ur: 1.94 mg/dL — ABNORMAL HIGH (ref 0.00–1.89)

## 2013-04-08 LAB — URINALYSIS, ROUTINE W REFLEX MICROSCOPIC
Bilirubin Urine: NEGATIVE
Glucose, UA: >1000 mg/dL — AB
Hgb urine dipstick: NEGATIVE
Ketones, ur: NEGATIVE mg/dL
Leukocytes, UA: NEGATIVE
Nitrite: NEGATIVE
Protein, ur: NEGATIVE mg/dL
Specific Gravity, Urine: 1.027 (ref 1.005–1.030)
Urobilinogen, UA: 0.2 mg/dL (ref 0.0–1.0)
pH: 5.5 (ref 5.0–8.0)

## 2013-04-08 LAB — TSH: TSH: 2.233 u[IU]/mL (ref 0.350–4.500)

## 2013-04-08 LAB — PSA: PSA: <0.01 ng/mL (ref <=4.00)

## 2013-04-08 LAB — URINALYSIS, MICROSCOPIC ONLY
Bacteria, UA: NONE SEEN
Casts: NONE SEEN
Crystals: NONE SEEN
Squamous Epithelial / LPF: NONE SEEN

## 2013-04-08 LAB — VITAMIN D 25 HYDROXY (VIT D DEFICIENCY, FRACTURES): Vit D, 25-Hydroxy: 80 ng/mL (ref 30–89)

## 2013-04-08 LAB — TESTOSTERONE: Testosterone: 161 ng/dL — ABNORMAL LOW (ref 300–890)

## 2013-04-08 LAB — INSULIN, FASTING: Insulin fasting, serum: 45 u[IU]/mL — ABNORMAL HIGH (ref 3–28)

## 2013-04-09 ENCOUNTER — Encounter: Payer: Self-pay | Admitting: Emergency Medicine

## 2013-04-09 LAB — TB SKIN TEST
Induration: 0 mm
TB Skin Test: NEGATIVE

## 2013-04-14 ENCOUNTER — Other Ambulatory Visit (INDEPENDENT_AMBULATORY_CARE_PROVIDER_SITE_OTHER): Payer: BC Managed Care – PPO | Admitting: *Deleted

## 2013-04-14 DIAGNOSIS — Z0000 Encounter for general adult medical examination without abnormal findings: Secondary | ICD-10-CM

## 2013-04-14 DIAGNOSIS — Z111 Encounter for screening for respiratory tuberculosis: Secondary | ICD-10-CM

## 2013-04-14 DIAGNOSIS — E119 Type 2 diabetes mellitus without complications: Secondary | ICD-10-CM

## 2013-04-14 DIAGNOSIS — E669 Obesity, unspecified: Secondary | ICD-10-CM

## 2013-04-14 DIAGNOSIS — Z23 Encounter for immunization: Secondary | ICD-10-CM

## 2013-04-14 DIAGNOSIS — M79609 Pain in unspecified limb: Secondary | ICD-10-CM

## 2013-04-14 DIAGNOSIS — Z1212 Encounter for screening for malignant neoplasm of rectum: Secondary | ICD-10-CM

## 2013-04-14 DIAGNOSIS — B351 Tinea unguium: Secondary | ICD-10-CM

## 2013-04-14 DIAGNOSIS — E1159 Type 2 diabetes mellitus with other circulatory complications: Secondary | ICD-10-CM

## 2013-04-14 DIAGNOSIS — I10 Essential (primary) hypertension: Secondary | ICD-10-CM

## 2013-04-14 DIAGNOSIS — E559 Vitamin D deficiency, unspecified: Secondary | ICD-10-CM

## 2013-04-14 DIAGNOSIS — Z125 Encounter for screening for malignant neoplasm of prostate: Secondary | ICD-10-CM

## 2013-04-14 DIAGNOSIS — E782 Mixed hyperlipidemia: Secondary | ICD-10-CM

## 2013-04-14 LAB — POC HEMOCCULT BLD/STL (HOME/3-CARD/SCREEN)
Card #2 Fecal Occult Blod, POC: NEGATIVE
Card #3 Fecal Occult Blood, POC: NEGATIVE
Fecal Occult Blood, POC: NEGATIVE

## 2013-04-17 ENCOUNTER — Other Ambulatory Visit: Payer: Self-pay | Admitting: Emergency Medicine

## 2013-04-17 ENCOUNTER — Telehealth: Payer: Self-pay | Admitting: *Deleted

## 2013-04-17 MED ORDER — SITAGLIPTIN PHOSPHATE 100 MG PO TABS: 100.0000 mg | ORAL_TABLET | Freq: Every day | ORAL | Status: DC

## 2013-04-17 NOTE — Telephone Encounter (Signed)
NEW RX = JANUVIA 1 PO QD # 90

## 2013-05-04 ENCOUNTER — Other Ambulatory Visit: Payer: Self-pay | Admitting: Internal Medicine

## 2013-05-12 ENCOUNTER — Other Ambulatory Visit: Payer: Self-pay | Admitting: Emergency Medicine

## 2013-05-20 ENCOUNTER — Ambulatory Visit (INDEPENDENT_AMBULATORY_CARE_PROVIDER_SITE_OTHER): Payer: BC Managed Care – PPO | Admitting: Internal Medicine

## 2013-05-20 ENCOUNTER — Encounter: Payer: Self-pay | Admitting: Internal Medicine

## 2013-05-20 VITALS — BP 140/78 | HR 96 | Temp 97.3°F | Resp 18 | Ht 74.0 in | Wt 335.0 lb

## 2013-05-20 DIAGNOSIS — M549 Dorsalgia, unspecified: Secondary | ICD-10-CM

## 2013-05-20 DIAGNOSIS — R5381 Other malaise: Secondary | ICD-10-CM

## 2013-05-20 DIAGNOSIS — R0602 Shortness of breath: Secondary | ICD-10-CM

## 2013-05-20 DIAGNOSIS — E119 Type 2 diabetes mellitus without complications: Secondary | ICD-10-CM

## 2013-05-20 DIAGNOSIS — I10 Essential (primary) hypertension: Secondary | ICD-10-CM

## 2013-05-20 DIAGNOSIS — R5383 Other fatigue: Secondary | ICD-10-CM

## 2013-05-20 DIAGNOSIS — R269 Unspecified abnormalities of gait and mobility: Secondary | ICD-10-CM

## 2013-05-20 DIAGNOSIS — R2681 Unsteadiness on feet: Secondary | ICD-10-CM

## 2013-05-20 NOTE — Patient Instructions (Signed)
Calorie Counting Diet A calorie counting diet requires you to eat the number of calories that are right for you in a day. Calories are the measurement of how much energy you get from the food you eat. Eating the right amount of calories is important for staying at a healthy weight. If you eat too many calories, your body will store them as fat and you may gain weight. If you eat too few calories, you may lose weight. Counting the number of calories you eat during a day will help you know if you are eating the right amount. A Registered Dietitian can determine how many calories you need in a day. The amount of calories needed varies from person to person. If your goal is to lose weight, you will need to eat fewer calories. Losing weight can benefit you if you are overweight or have health problems such as heart disease, high blood pressure, or diabetes. If your goal is to gain weight, you will need to eat more calories. Gaining weight may be necessary if you have a certain health problem that causes your body to need more energy. TIPS Whether you are increasing or decreasing the number of calories you eat during a day, it may be hard to get used to changes in what you eat and drink. The following are tips to help you keep track of the number of calories you eat.  Measure foods at home with measuring cups. This helps you know the amount of food and number of calories you are eating.  Restaurants often serve food in amounts that are larger than 1 serving. While eating out, estimate how many servings of a food you are given. For example, a serving of cooked rice is  cup or about the size of half of a fist. Knowing serving sizes will help you be aware of how much food you are eating at restaurants.  Ask for smaller portion sizes or child-size portions at restaurants.  Plan to eat half of a meal at a restaurant. Take the rest home or share the other half with a friend.  Read the Nutrition Facts panel on  food labels for calorie content and serving size. You can find out how many servings are in a package, the size of a serving, and the number of calories each serving has.  For example, a package might contain 3 cookies. The Nutrition Facts panel on that package says that 1 serving is 1 cookie. Below that, it will say there are 3 servings in the container. The calories section of the Nutrition Facts label says there are 90 calories. This means there are 90 calories in 1 cookie (1 serving). If you eat 1 cookie you have eaten 90 calories. If you eat all 3 cookies, you have eaten 270 calories (3 servings x 90 calories = 270 calories). The list below tells you how big or small some common portion sizes are.  1 oz.........4 stacked dice.  3 oz.........Deck of cards.  1 tsp........Tip of little finger.  1 tbs........Thumb.  2 tbs........Golf ball.   cup.......Half of a fist.  1 cup........A fist. KEEP A FOOD LOG Write down every food item you eat, the amount you eat, and the number of calories in each food you eat during the day. At the end of the day, you can add up the total number of calories you have eaten. It may help to keep a list like the one below. Find out the calorie information by reading the   Nutrition Facts panel on food labels. Breakfast  Bran cereal (1 cup, 110 calories).  Fat-free milk ( cup, 45 calories). Snack  Apple (1 medium, 80 calories). Lunch  Spinach (1 cup, 20 calories).  Tomato ( medium, 20 calories).  Chicken breast strips (3 oz, 165 calories).  Shredded cheddar cheese ( cup, 110 calories).  Light Italian dressing (2 tbs, 60 calories).  Whole-wheat bread (1 slice, 80 calories).  Tub margarine (1 tsp, 35 calories).  Vegetable soup (1 cup, 160 calories). Dinner  Pork chop (3 oz, 190 calories).  Brown rice (1 cup, 215 calories).  Steamed broccoli ( cup, 20 calories).  Strawberries (1  cup, 65 calories).  Whipped cream (1 tbs, 50  calories). Daily Calorie Total: 1425 Document Released: 01/02/2005 Document Revised: 03/27/2011 Document Reviewed: 06/29/2006 [employer] Patient Information 2014 [employer].  Exercise to Lose Weight Exercise and a healthy diet may help you lose weight. Your doctor may suggest specific exercises. EXERCISE IDEAS AND TIPS  Choose low-cost things you enjoy doing, such as walking, bicycling, or exercising to workout videos.  Take stairs instead of the elevator.  Walk during your lunch break.  Park your car further away from work or school.  Go to a gym or an exercise class.  Start with 5 to 10 minutes of exercise each day. Build up to 30 minutes of exercise 4 to 6 days a week.  Wear shoes with good support and comfortable clothes.  Stretch before and after working out.  Work out until you breathe harder and your heart beats faster.  Drink extra water when you exercise.  Do not do so much that you hurt yourself, feel dizzy, or get very short of breath. Exercises that burn about 150 calories:  Running 1  miles in 15 minutes.  Playing volleyball for 45 to 60 minutes.  Washing and waxing a car for 45 to 60 minutes.  Playing touch football for 45 minutes.  Walking 1  miles in 35 minutes.  Pushing a stroller 1  miles in 30 minutes.  Playing basketball for 30 minutes.  Raking leaves for 30 minutes.  Bicycling 5 miles in 30 minutes.  Walking 2 miles in 30 minutes.  Dancing for 30 minutes.  Shoveling snow for 15 minutes.  Swimming laps for 20 minutes.  Walking up stairs for 15 minutes.  Bicycling 4 miles in 15 minutes.  Gardening for 30 to 45 minutes.  Jumping rope for 15 minutes.  Washing windows or floors for 45 to 60 minutes. Document Released: 02/04/2010 Document Revised: 03/27/2011 Document Reviewed: 02/04/2010 [employer] Patient Information 2014 [employer].   

## 2013-05-20 NOTE — Progress Notes (Signed)
Patient ID: Jason Wyatt, male   DOB: [birth year], 57 y.o.   MRN: [id number]    This very nice 58 y.o. MWM presents for  follow up with complaints of malaise and fatigue. Note that patient had a negative heart cath by Dr Martinique about 10 months ago. Patient is morbidly obese weighing in at 335# and BMI 43 and calculated ideal body weight of 175-180# max. He also has  Hypertension, Hyperlipidemia, T2 NIDDM and Vitamin D Deficiency.    Patient also complains of LBP w/o sciatica and apparently has had trigger point injections by Dr Bethena Roys and is now scheduled for An EDSI. Patient reports he feels "wobbly" in his gait an purchased a cane at [place] for 5$ and which makes him feel more secure standing. He requests to have paperwork from his Disability lawyer filled out as he has lost his 1st appeal for disability. Re is advised of referral to the [facility] for work potential assessment.   BP has been controlled at home. Today's BP: 140/78 mmHg. Patient denies any cardiac type chest pain, palpitations, dyspnea/orthopnea/PND, dizziness, claudication, or dependent edema.   Hyperlipidemia is controlled with diet & meds. Last lipid profile as below is controlled at goal. Patient denies myalgias or other med SE's.  Lab Results  Component Value Date   CHOL 163 04/07/2013   HDL 33* 04/07/2013   LDLCALC 70 04/07/2013   TRIG 300* 04/07/2013   CHOLHDL 4.9 04/07/2013    Also, the patient has T2 NIDDM w/Stage 2 CKD and last A1c was 7.3% in Mar 2015. Patient denies any symptoms of reactive hypoglycemia, diabetic polys, paresthesias or visual blurring. Lab Results  Component Value Date   HGBA1C 7.3* 04/07/2013    Further, Patient has history of Vitamin D Deficiency with last vitamin D of 70 in Oct 2014. Patient supplements vitamin D without any suspected side-effects.    Medication List       acyclovir 400 MG tablet  Commonly known as:  ZOVIRAX  Take 400 mg by mouth daily as needed. For  cold sores     allopurinol 300 MG tablet  Commonly known as:  ZYLOPRIM  Take 300 mg by mouth at bedtime.     aspirin 81 MG tablet  Take 81 mg by mouth every morning.     carbidopa-levodopa 25-250 MG per tablet  Commonly known as:  SINEMET IR  Take 1 tablet by mouth 2 (two) times daily.     colchicine 0.6 MG tablet  Take 0.6 mg by mouth 2 (two) times daily. prn     DULoxetine 60 MG capsule  Commonly known as:  CYMBALTA  Take 60 mg by mouth at bedtime.     enalapril 20 MG tablet  Commonly known as:  VASOTEC  Take 20 mg by mouth 2 (two) times daily.     FARXIGA 10 MG Tabs  Generic drug:  Dapagliflozin Propanediol  TAKE 1 BY MOUTH DAILY PAC: MELISSA SMITH     gabapentin 300 MG capsule  Commonly known as:  NEURONTIN  Take 1 capsule (300 mg total) by mouth 3 (three) times daily.     lansoprazole 30 MG capsule  Commonly known as:  PREVACID  Take 1 capsule (30 mg total) by mouth daily. Patient uses this medication for heartburn.     Magnesium 250 MG Tabs  Take 250 mg by mouth daily.     metFORMIN 1000 MG tablet  Commonly known as:  GLUCOPHAGE  Take 1,000 mg  by mouth 2 (two) times daily with a meal.     multivitamin with minerals Tabs tablet  Take 1 tablet by mouth daily.     NASONEX 50 MCG/ACT nasal spray  Generic drug:  mometasone  Place 2 sprays into the nose daily as needed. Allergies     omega-3 acid ethyl esters 1 G capsule  Commonly known as:  LOVAZA  Take 2 capsules (2 g total) by mouth 2 (two) times daily.     sitaGLIPtin 100 MG tablet  Commonly known as:  JANUVIA  Take 1 tablet (100 mg total) by mouth daily.     tamsulosin 0.4 MG Caps capsule  Commonly known as:  FLOMAX  Take 0.4 mg by mouth daily.     Vitamin D (Ergocalciferol) 50000 UNITS Caps capsule  Commonly known as:  DRISDOL  50,000 Units 4 (four) times a week. Sunday, Tuesday, Thursday and saturday         Allergies  Allergen Reactions  . Flaxseed [Linseed Oil] Rash  . Lyrica  [Pregabalin] Itching and Rash  . Niaspan [Niacin Er] Rash    PMHx:   Past Medical History  Diagnosis Date  . Varicose veins   . Gout   . Hypertension   . Morbid obesity   . Complication of anesthesia 06/2010    "woke up next day w/ventilator on"  . High cholesterol   . Peripheral neuropathy   . Exertional dyspnea 08/03/11  . OSA (obstructive sleep apnea)     "use CPAP"  . Type II diabetes mellitus   . GERD (gastroesophageal reflux disease)   . Arthritis     "in my back"  . Chronic UTI (urinary tract infection) 08/03/11    "last month or so"  . S/P cardiac catheterization 08/25/11    normal coronary anatomy; normal LV function; normal right heart pressures  . Prostate cancer   . Anemia   . Thyroid nodule     FHx:    Reviewed / unchanged  SHx:    Reviewed / unchanged   Systems Review: Constitutional: Denies fever, chills, wt changes, headaches, insomnia, fatigue, night sweats, change in appetite. Eyes: Denies redness, blurred vision, diplopia, discharge, itchy, watery eyes.  ENT: Denies discharge, congestion, post nasal drip, epistaxis, sore throat, earache, hearing loss, dental pain, tinnitus, vertigo, sinus pain, snoring.  CV: Denies chest pain, palpitations, irregular heartbeat, syncope, dyspnea, diaphoresis, orthopnea, PND, claudication or edema. Respiratory: denies cough, dyspnea, DOE, pleurisy, hoarseness, laryngitis, wheezing.  Gastrointestinal: Denies dysphagia, odynophagia, heartburn, reflux, water brash, abdominal pain or cramps, nausea, vomiting, bloating, diarrhea, constipation, hematemesis, melena, hematochezia  or hemorrhoids. Genitourinary: Denies dysuria, frequency, urgency, nocturia, hesitancy, discharge, hematuria or flank pain. Musculoskeletal:  LBP as above. Skin: Denies pruritus, rash, hives, warts, acne, eczema or change in skin lesion(s). Neuro: No weakness, tremor, incoordination, spasms, paresthesia or pain. Psychiatric: Denies confusion, memory loss or  sensory loss. Endo: Denies change in weight, skin or hair change.  Heme/Lymph: No excessive bleeding, bruising or enlarged lymph nodes.   Exam:  BP 140/78  Pulse 96  Temp 97.3 F   Resp 18  Ht 6\' 2"    Wt 335 lb   BMI 42.99 kg/m2  Appears well nourished - in no distress. Eyes: PERRLA, EOMs, conjunctiva no swelling or erythema. Sinuses: No frontal/maxillary tenderness ENT/Mouth: EAC's clear, TM's nl w/o erythema, bulging. Nares clear w/o erythema, swelling, exudates. Oropharynx clear without erythema or exudates. Oral hygiene is good. Tongue normal, non obstructing. Hearing intact.  Neck: Supple. Thyroid  nl. Car 2+/2+ without bruits, nodes or JVD. Chest: Respirations nl with BS clear & equal w/o rales, rhonchi, wheezing or stridor.  Cor: Heart sounds normal w/ regular rate and rhythm without sig. murmurs, gallops, clicks, or rubs. Peripheral pulses normal and equal  without edema.  Abdomen: Soft & bowel sounds normal. Non-tender w/o guarding, rebound, hernias, masses, or organomegaly.  Lymphatics: Unremarkable.  Musculoskeletal: Full ROM all peripheral extremities, joint stability, 5/5 strength, and normal gait w/o limp or obvious instability.  Skin: Warm, dry without exposed rashes, lesions or ecchymosis apparent.  Neuro: Cranial nerves intact, reflexes equal bilaterally. Sensory-motor testing grossly intact. Tendon reflexes grossly intact.  Pysch: Alert & oriented x 3. Insight and judgement nl & appropriate. No ideations.  Assessment and Plan:  1. Hypertension - Continue monitor blood pressure at home. Continue diet/meds same.  2. Hyperlipidemia - Continue diet/meds, exercise,& lifestyle modifications. Continue monitor periodic cholesterol/liver & renal functions   3. T2 NIDDM - continue recommend prudent low glycemic diet, weight control, regular exercise, diabetic monitoring and periodic eye exams.  4. Malaise and Fatigue - advised weight loss and recommended Dr Fara Olden Fuhrman's  book "The End of Dieting"   Recommended regular exercise, BP monitoring, weight control, and discussed med and SE's. Recommended labs to assess and monitor clinical status. Further disposition pending results of labs.

## 2013-05-28 ENCOUNTER — Ambulatory Visit: Payer: BC Managed Care – PPO

## 2013-06-03 ENCOUNTER — Encounter: Payer: Self-pay | Admitting: Podiatry

## 2013-06-03 ENCOUNTER — Ambulatory Visit (INDEPENDENT_AMBULATORY_CARE_PROVIDER_SITE_OTHER): Payer: BC Managed Care – PPO | Admitting: Podiatry

## 2013-06-03 VITALS — BP 127/71 | HR 91 | Resp 16

## 2013-06-03 DIAGNOSIS — M79609 Pain in unspecified limb: Secondary | ICD-10-CM

## 2013-06-03 DIAGNOSIS — B351 Tinea unguium: Secondary | ICD-10-CM

## 2013-06-03 NOTE — Progress Notes (Signed)
He presents today chief complaint of painful elongated toenails.  Objective: Nails are thick yellow dystrophic onychomycotic.  Assessment: Pain in limb secondary to onychomycosis 1 through 5 bilateral.  Plan: Debridement of nails in thickness and length as cover service secondary to pain.

## 2013-06-03 NOTE — Patient Instructions (Signed)
Diabetes and Foot Care Diabetes may cause you to have problems because of poor blood supply (circulation) to your feet and legs. This may cause the skin on your feet to become thinner, break easier, and heal more slowly. Your skin may become dry, and the skin may peel and crack. You may also have nerve damage in your legs and feet causing decreased feeling in them. You may not notice minor injuries to your feet that could lead to infections or more serious problems. Taking care of your feet is one of the most important things you can do for yourself.  HOME CARE INSTRUCTIONS  Wear shoes at all times, even in the house. Do not go barefoot. Bare feet are easily injured.  Check your feet daily for blisters, cuts, and redness. If you cannot see the bottom of your feet, use a mirror or ask someone for help.  Wash your feet with warm water (do not use hot water) and mild soap. Then pat your feet and the areas between your toes until they are completely dry. Do not soak your feet as this can dry your skin.  Apply a moisturizing lotion or petroleum jelly (that does not contain alcohol and is unscented) to the skin on your feet and to dry, brittle toenails. Do not apply lotion between your toes.  Trim your toenails straight across. Do not dig under them or around the cuticle. File the edges of your nails with an emery board or nail file.  Do not cut corns or calluses or try to remove them with medicine.  Wear clean socks or stockings every day. Make sure they are not too tight. Do not wear knee-high stockings since they may decrease blood flow to your legs.  Wear shoes that fit properly and have enough cushioning. To break in new shoes, wear them for just a few hours a day. This prevents you from injuring your feet. Always look in your shoes before you put them on to be sure there are no objects inside.  Do not cross your legs. This may decrease the blood flow to your feet.  If you find a minor scrape,  cut, or break in the skin on your feet, keep it and the skin around it clean and dry. These areas may be cleansed with mild soap and water. Do not cleanse the area with peroxide, alcohol, or iodine.  When you remove an adhesive bandage, be sure not to damage the skin around it.  If you have a wound, look at it several times a day to make sure it is healing.  Do not use heating pads or hot water bottles. They may burn your skin. If you have lost feeling in your feet or legs, you may not know it is happening until it is too late.  Make sure your health care provider performs a complete foot exam at least annually or more often if you have foot problems. Report any cuts, sores, or bruises to your health care provider immediately. SEEK MEDICAL CARE IF:   You have an injury that is not healing.  You have cuts or breaks in the skin.  You have an ingrown nail.  You notice redness on your legs or feet.  You feel burning or tingling in your legs or feet.  You have pain or cramps in your legs and feet.  Your legs or feet are numb.  Your feet always feel cold. SEEK IMMEDIATE MEDICAL CARE IF:   There is increasing redness,   swelling, or pain in or around a wound.  There is a red line that goes up your leg.  Pus is coming from a wound.  You develop a fever or as directed by your health care provider.  You notice a bad smell coming from an ulcer or wound. Document Released: 12/31/1999 Document Revised: 09/04/2012 Document Reviewed: 06/11/2012 [employer] Patient Information 2014 [employer].  

## 2013-06-15 IMAGING — US US SOFT TISSUE HEAD/NECK
1 series · 14 of 25 positions shown · non-contrast
Comparison: Ultrasound of the thyroid of 08/31/2011 and ultrasound
biopsy of 09/19/2011

CLINICAL DATA: History of thyroid neoplasm, follow-up

THYROID ULTRASOUND
TECHNIQUE: Ultrasound examination of the thyroid gland and adjacent
soft tissues was performed.

[Series 1: us soft tissue head/neck · 0.11mm/px · 14 of 55 slices shown]
[im 1/55]
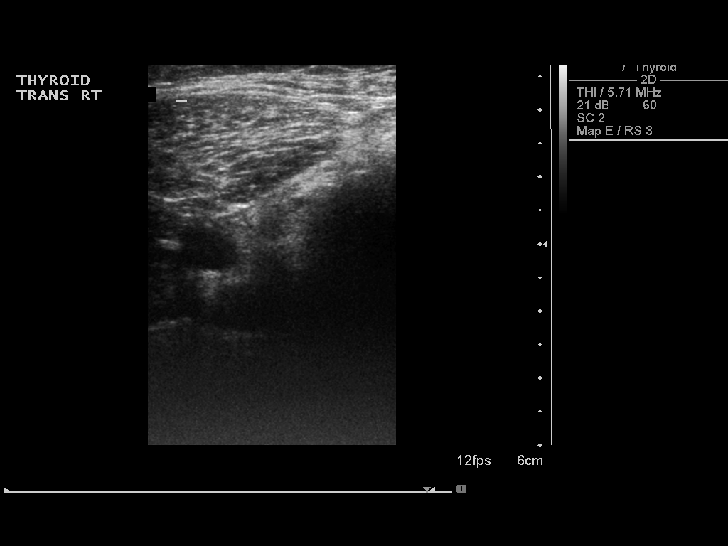
[im 5/55]
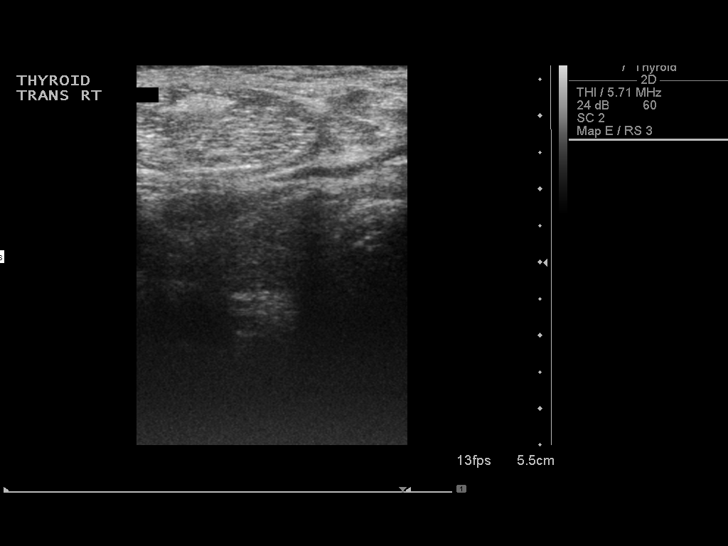
[im 10/55]
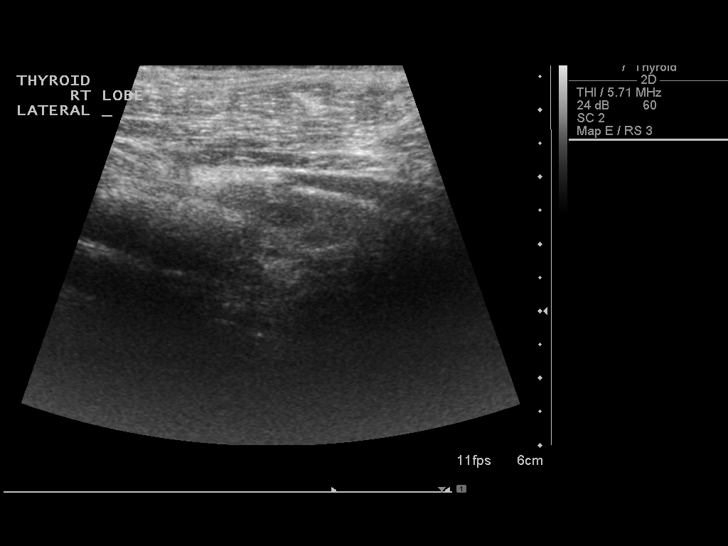
[im 14/55]
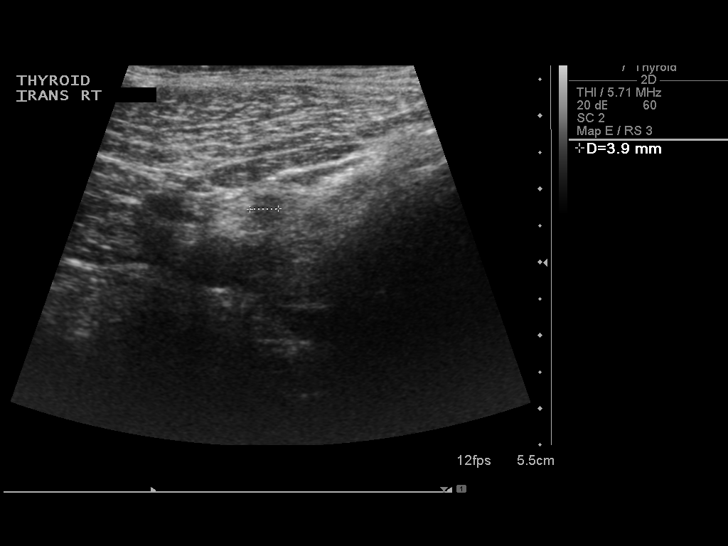
[im 19/55]
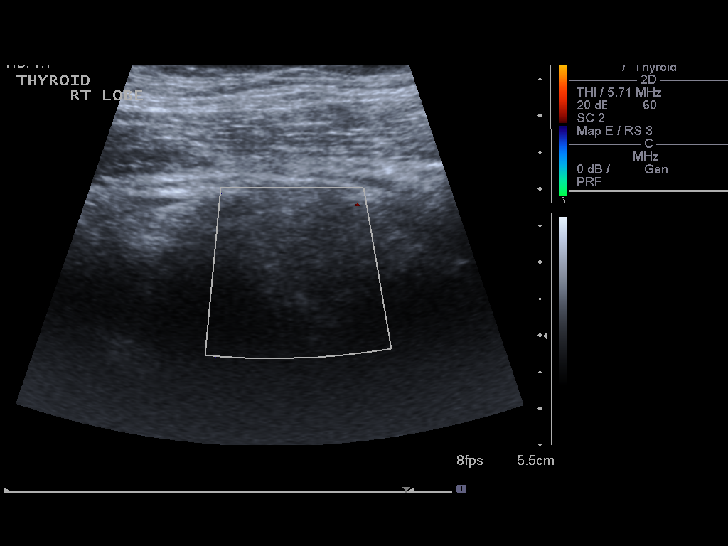
[im 21/55]
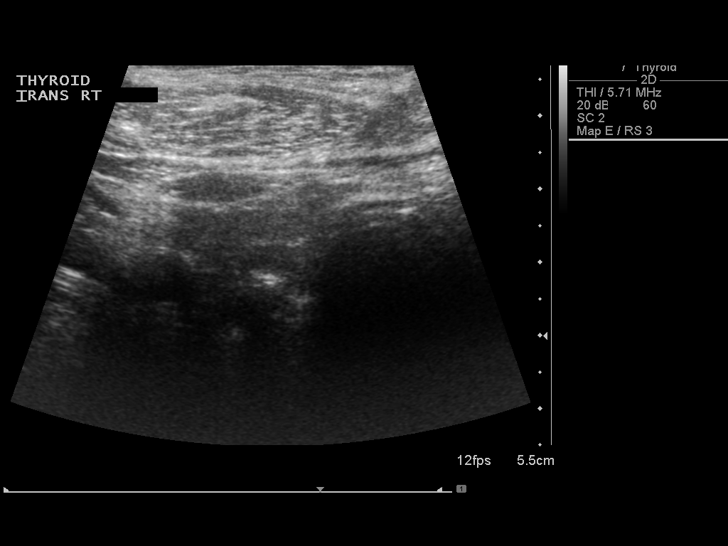
[im 25/55]
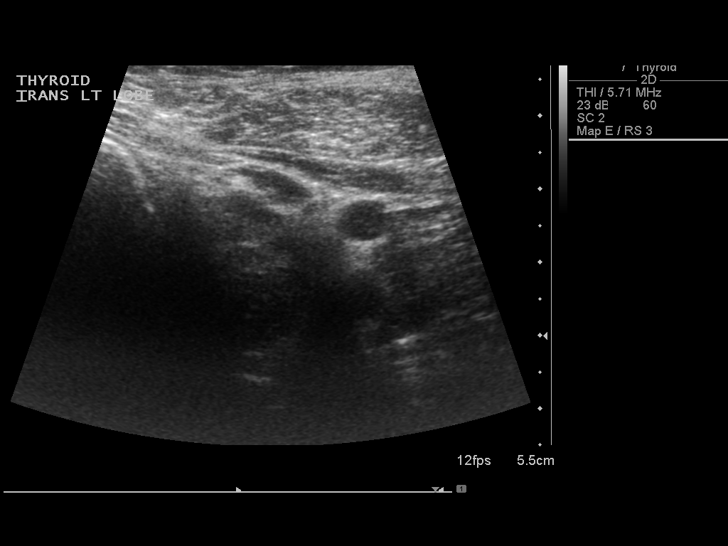
[im 30/55]
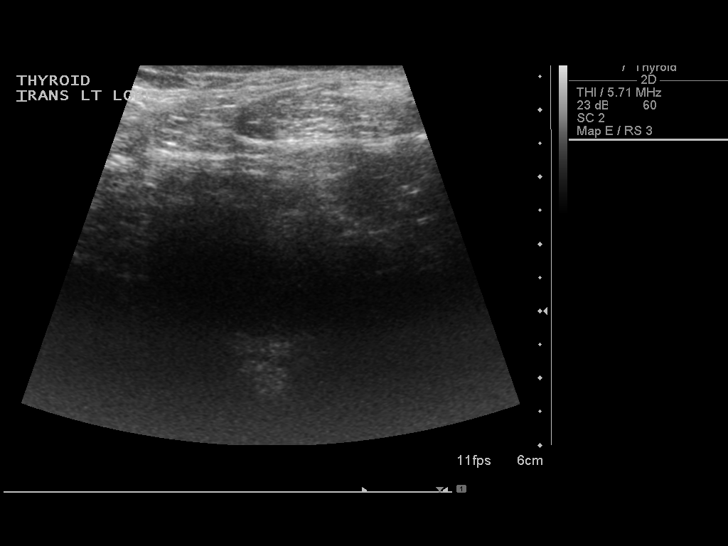
[im 34/55]
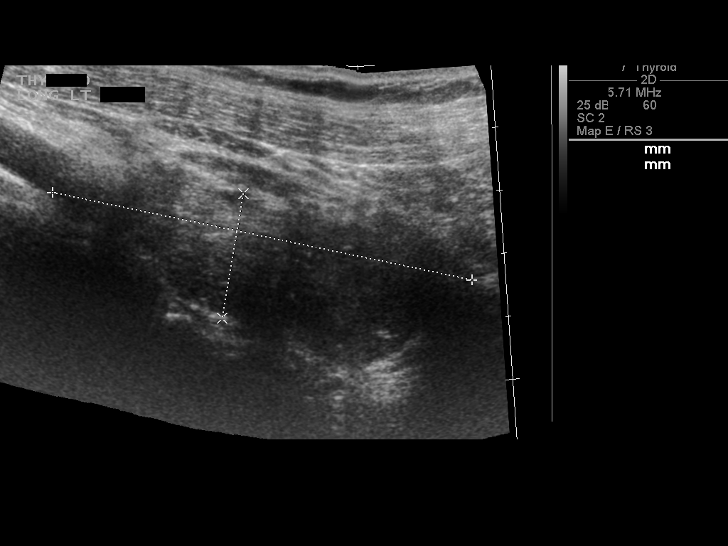
[im 37/55]
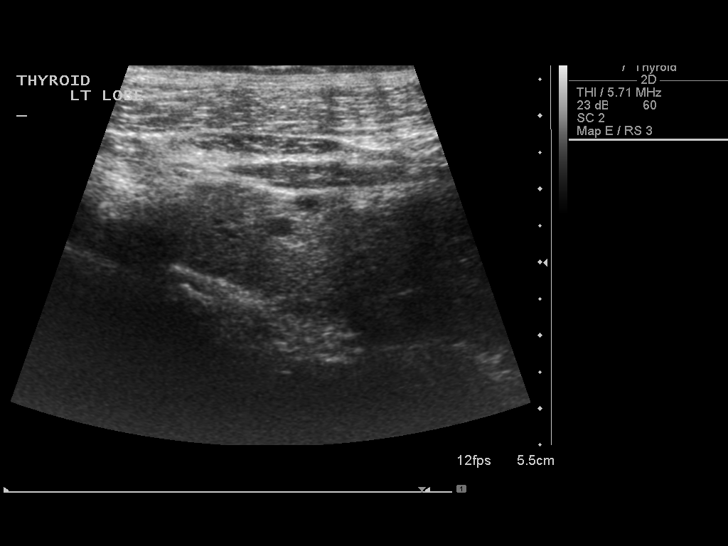
[im 41/55]
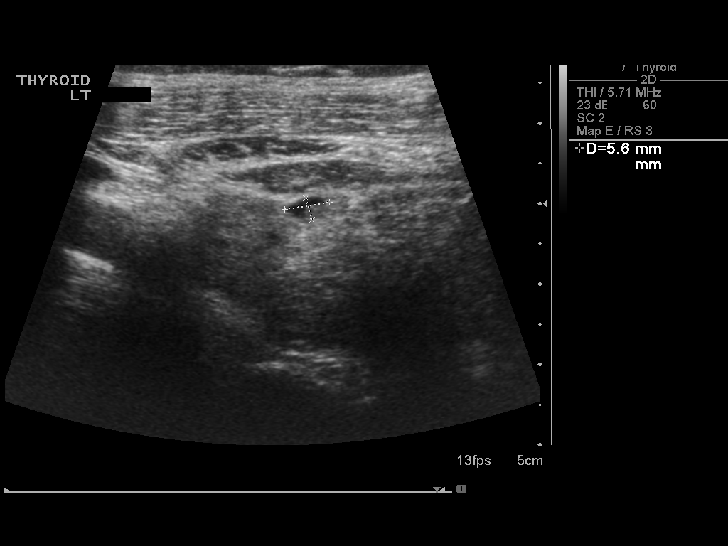
[im 46/55]
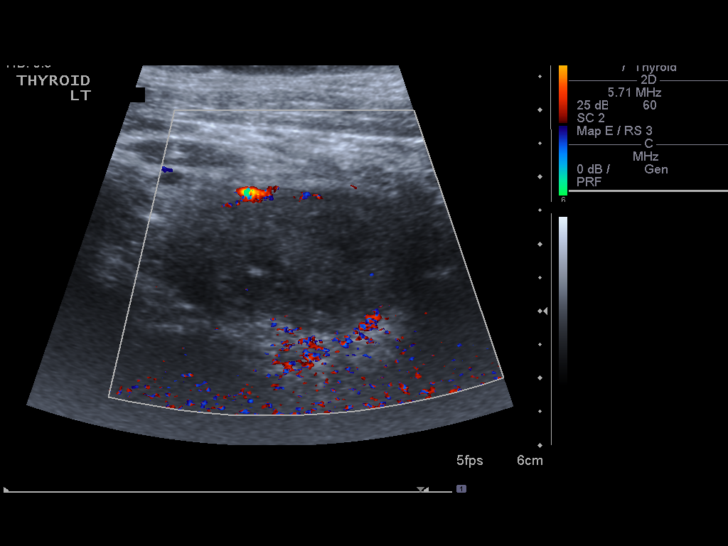
[im 50/55]
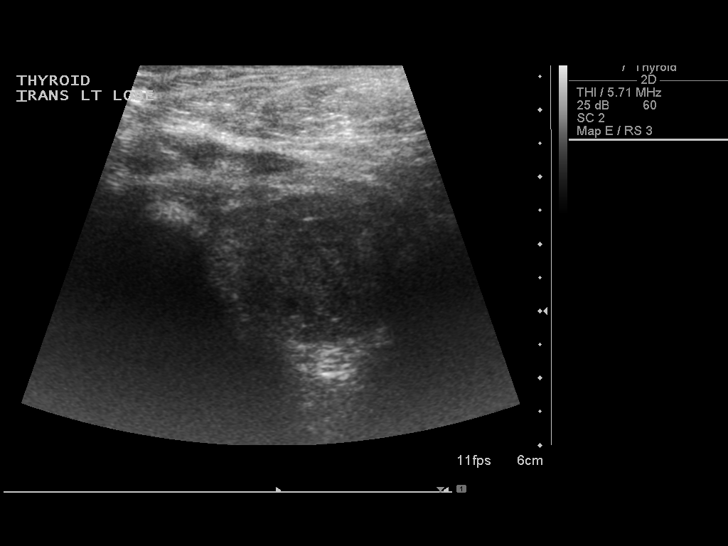
[im 55/55]
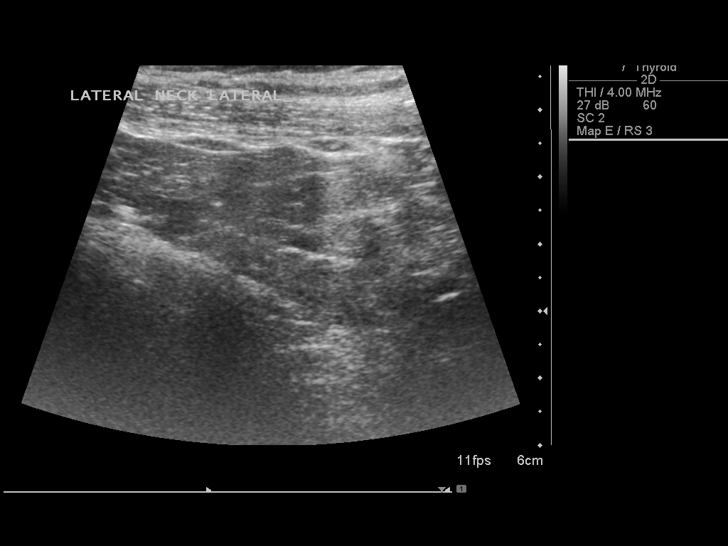

[14 of 25 positions shown; findings below may reference images not displayed]

FINDINGS: Right thyroid lobe:  4.2 x 2.1 x 1.7 cm.  (Previously 4.2 x 1.8 x
1.7 cm).
Left thyroid lobe:  Thick 0.8 x 2.0 x 2.5 cm.  (Previously 7.0 x
2.6 x 2.8 cm).
Isthmus:   2 mm in thickness.

Focal nodules:  The echogenicity of the thyroid gland is diffusely
inhomogeneous.  Thyroid nodules again are noted bilaterally.  The
dominant solid nodule is in the lower pole of the left lobe
measuring 3.8 x 2.2 x 2.7 cm compared to prior measurements of
x 2.2 x 2.6 cm.  Additional nodules are present bilaterally of no
more than 8 mm in diameter.

Lymphadenopathy:  None visualized.
IMPRESSION: Stable dominant solid nodule in the lower pole of the left lobe of
3.8 cm in diameter.  No enlarging nodule is seen.

## 2013-06-24 ENCOUNTER — Other Ambulatory Visit: Payer: Self-pay | Admitting: *Deleted

## 2013-06-24 MED ORDER — ENALAPRIL MALEATE 20 MG PO TABS: 20.0000 mg | ORAL_TABLET | Freq: Two times a day (BID) | ORAL | Status: DC

## 2013-06-24 MED ORDER — METFORMIN HCL 1000 MG PO TABS: 1000.0000 mg | ORAL_TABLET | Freq: Two times a day (BID) | ORAL | Status: DC

## 2013-06-24 MED ORDER — ALLOPURINOL 300 MG PO TABS: 300.0000 mg | ORAL_TABLET | Freq: Every day | ORAL | Status: DC

## 2013-06-24 MED ORDER — ACYCLOVIR 400 MG PO TABS: 400.0000 mg | ORAL_TABLET | Freq: Every day | ORAL | Status: DC | PRN

## 2013-06-24 MED ORDER — DULOXETINE HCL 60 MG PO CPEP: 60.0000 mg | ORAL_CAPSULE | Freq: Every day | ORAL | Status: DC

## 2013-06-24 MED ORDER — VITAMIN D (ERGOCALCIFEROL) 1.25 MG (50000 UNIT) PO CAPS: 50000.0000 [IU] | ORAL_CAPSULE | ORAL | Status: DC

## 2013-06-28 ENCOUNTER — Other Ambulatory Visit: Payer: Self-pay | Admitting: Internal Medicine

## 2013-07-15 ENCOUNTER — Ambulatory Visit (INDEPENDENT_AMBULATORY_CARE_PROVIDER_SITE_OTHER): Payer: BC Managed Care – PPO | Admitting: Emergency Medicine

## 2013-07-15 ENCOUNTER — Encounter: Payer: Self-pay | Admitting: Emergency Medicine

## 2013-07-15 VITALS — BP 138/84 | HR 110 | Temp 97.3°F | Resp 18 | Wt 333.8 lb

## 2013-07-15 DIAGNOSIS — R5383 Other fatigue: Secondary | ICD-10-CM

## 2013-07-15 DIAGNOSIS — I10 Essential (primary) hypertension: Secondary | ICD-10-CM

## 2013-07-15 DIAGNOSIS — R5381 Other malaise: Secondary | ICD-10-CM

## 2013-07-15 DIAGNOSIS — E782 Mixed hyperlipidemia: Secondary | ICD-10-CM

## 2013-07-15 DIAGNOSIS — E1159 Type 2 diabetes mellitus with other circulatory complications: Secondary | ICD-10-CM

## 2013-07-15 LAB — CBC WITH DIFFERENTIAL/PLATELET
Basophils Absolute: 0.1 10*3/uL (ref 0.0–0.1)
Basophils Relative: 1 % (ref 0–1)
Eosinophils Absolute: 0.1 10*3/uL (ref 0.0–0.7)
Eosinophils Relative: 1 % (ref 0–5)
HCT: 45.8 % (ref 39.0–52.0)
Hemoglobin: 15.9 g/dL (ref 13.0–17.0)
Lymphocytes Relative: 22 % (ref 12–46)
Lymphs Abs: 2.1 10*3/uL (ref 0.7–4.0)
MCH: 31.3 pg (ref 26.0–34.0)
MCHC: 34.7 g/dL (ref 30.0–36.0)
MCV: 90.2 fL (ref 78.0–100.0)
Monocytes Absolute: 0.6 10*3/uL (ref 0.1–1.0)
Monocytes Relative: 6 % (ref 3–12)
Neutro Abs: 6.8 10*3/uL (ref 1.7–7.7)
Neutrophils Relative %: 70 % (ref 43–77)
Platelets: 261 10*3/uL (ref 150–400)
RBC: 5.08 MIL/uL (ref 4.22–5.81)
RDW: 15.8 % — ABNORMAL HIGH (ref 11.5–15.5)
WBC: 9.7 10*3/uL (ref 4.0–10.5)

## 2013-07-15 LAB — HEMOGLOBIN A1C
Hgb A1c MFr Bld: 6.8 % — ABNORMAL HIGH (ref <5.7)
Mean Plasma Glucose: 148 mg/dL — ABNORMAL HIGH (ref <117)

## 2013-07-15 NOTE — Progress Notes (Signed)
Subjective:    Patient ID: Jason Wyatt, male    DOB: [birth year], 58 y.o.   MRN: [id number]  HPI Comments: 58 yo Obese WM presents for 3 month F/U for HTN, Cholesterol, DM, D. Deficient. BS 100-150s. He is not exercising. He is not eating healthy. He is trying to decrease portions except for potatoes. He notes BP good. He notes pulse is always a little high.   He has been fatigued over the last year with multiple negative workups. He notes weight loss has not helped with fatigue.  WBC             9.1   04/07/2013 HGB            15.6   04/07/2013 HCT            44.3   04/07/2013 PLT             257   04/07/2013 GLUCOSE         129   04/07/2013 CHOL            163   04/07/2013 TRIG            300   04/07/2013 HDL              33   04/07/2013 LDLCALC          70   04/07/2013 ALT              42   04/07/2013 AST              36   04/07/2013 NA              140   04/07/2013 K               4.5   04/07/2013 CL              105   04/07/2013 CREATININE     1.00   04/07/2013 BUN              16   04/07/2013 CO2              25   04/07/2013 TSH           2.233   04/07/2013 PSA           <0.01   04/07/2013 INR             1.0   08/24/2011 HGBA1C          7.3   04/07/2013 MICROALBUR     1.94   04/07/2013      Medication List       This list is accurate as of: 07/15/13 10:54 AM.  Always use your most recent med list.               acyclovir 400 MG tablet  Commonly known as:  ZOVIRAX  TAKE 1 BY MOUTH DAILY     allopurinol 300 MG tablet  Commonly known as:  ZYLOPRIM  TAKE 1 BY MOUTH DAILY FOR GOUT     aspirin 81 MG tablet  Take 81 mg by mouth every morning.     carbidopa-levodopa 25-250 MG per tablet  Commonly known as:  SINEMET IR  Take 1 tablet by mouth 2 (two) times daily.     colchicine 0.6 MG tablet  Take 0.6 mg by mouth 2 (two) times daily. prn     DULoxetine 60 MG capsule  Commonly known as:  CYMBALTA  TAKE 1 BY MOUTH DAILY ~Wister Hoefle, PAC     enalapril 20 MG tablet  Commonly  known as:  VASOTEC  TAKE 1 BY MOUTH TWICE DAILY FOR BLOOD PRESSURE AND KIDNEYS     FARXIGA 10 MG Tabs  Generic drug:  Dapagliflozin Propanediol  TAKE 1 BY MOUTH DAILY PAC: Sanaiya Welliver     gabapentin 300 MG capsule  Commonly known as:  NEURONTIN  Take 1 capsule (300 mg total) by mouth 3 (three) times daily.     lansoprazole 30 MG capsule  Commonly known as:  PREVACID  Take 1 capsule (30 mg total) by mouth daily. Patient uses this medication for heartburn.     Magnesium 250 MG Tabs  Take 250 mg by mouth daily.     metFORMIN 1000 MG tablet  Commonly known as:  GLUCOPHAGE  TAKE 1 BY MOUTH TWICE DAILY AFTER MEALS FOR DIABETES     multivitamin with minerals Tabs tablet  Take 1 tablet by mouth daily.     NASONEX 50 MCG/ACT nasal spray  Generic drug:  mometasone  Place 2 sprays into the nose daily as needed. Allergies     omega-3 acid ethyl esters 1 G capsule  Commonly known as:  LOVAZA  Take 2 capsules (2 g total) by mouth 2 (two) times daily.     sitaGLIPtin 100 MG tablet  Commonly known as:  JANUVIA  Take 1 tablet (100 mg total) by mouth daily.     tamsulosin 0.4 MG Caps capsule  Commonly known as:  FLOMAX  Take 0.4 mg by mouth daily.     Vitamin D (Ergocalciferol) 50000 UNITS Caps capsule  Commonly known as:  DRISDOL  TAKE 1 BY MOUTH DAILY FOR SEVERE VITAMIN D DEFICIENCY       Allergies  Allergen Reactions  . Flaxseed [Linseed Oil] Rash  . Lyrica [Pregabalin] Itching and Rash  . Niaspan [Niacin Er] Rash   Past Medical History  Diagnosis Date  . Varicose veins   . Gout   . Hypertension   . Morbid obesity   . Complication of anesthesia 06/2010    "woke up next day w/ventilator on"  . High cholesterol   . Peripheral neuropathy   . Exertional dyspnea 08/03/11  . OSA (obstructive sleep apnea)     "use CPAP"  . Type II diabetes mellitus   . GERD (gastroesophageal reflux disease)   . Arthritis     "in my back"  . Chronic UTI (urinary tract infection) 08/03/11     "last month or so"  . S/P cardiac catheterization 08/25/11    normal coronary anatomy; normal LV function; normal right heart pressures  . Prostate cancer   . Anemia   . Thyroid nodule      Review of Systems  Constitutional: Positive for fatigue.  All other systems reviewed and are negative.  BP 138/84  Pulse 110  Temp(Src) 97.3 F (36.3 C)  Resp 18  Wt 333 lb 12.8 oz (151.411 kg)     Objective:   Physical Exam  Nursing note and vitals reviewed. Constitutional: He is oriented to person, place, and time. He appears well-developed and well-nourished.  obese  HENT:  Head: Normocephalic and atraumatic.  Right Ear: External ear normal.  Left Ear: External ear normal.  Nose: Nose normal.  Eyes: Conjunctivae and EOM are normal.  Neck: Normal range of motion. Neck supple. No JVD present. No thyromegaly present.  Cardiovascular: Normal rate, regular rhythm, normal heart sounds and  intact distal pulses.   Pulmonary/Chest: Effort normal and breath sounds normal.  Abdominal: Soft. Bowel sounds are normal. He exhibits no distension and no mass. There is no tenderness.  Musculoskeletal: Normal range of motion. He exhibits no edema and no tenderness.  With cain  Lymphadenopathy:    He has no cervical adenopathy.  Neurological: He is alert and oriented to person, place, and time. He has normal reflexes. No cranial nerve deficit. Coordination normal.  Skin: Skin is warm and dry.  Exposed area  Psychiatric: He has a normal mood and affect. His behavior is normal. Judgment and thought content normal.          Assessment & Plan:  1.  3 month F/U for Obesity, HTN, Cholesterol,DM, D. Deficient. Needs healthy diet, cardio QD and obtain healthy weight. Check Labs, Check BP if >130/80 call office, Check BS if >200 call office   2. Fatigue vs elevated pulse- check labs, increase activity and M9P, Trial Bystolic 5 mg Sx #7 if no change increase to 10 mg Sx#14, cal lwith results  3. Back  pain/ neuropathy- advised needs to do chair exercises daily to build leg strength, keep ortho/ neuro f/u   OVER 40 minutes of exam, counseling, chart review. New diet book given for eating out.

## 2013-07-16 LAB — HEPATIC FUNCTION PANEL
ALT: 32 U/L (ref 0–53)
AST: 25 U/L (ref 0–37)
Albumin: 4.3 g/dL (ref 3.5–5.2)
Alkaline Phosphatase: 60 U/L (ref 39–117)
Bilirubin, Direct: 0.1 mg/dL (ref 0.0–0.3)
Indirect Bilirubin: 0.5 mg/dL (ref 0.2–1.2)
Total Bilirubin: 0.6 mg/dL (ref 0.2–1.2)
Total Protein: 6.9 g/dL (ref 6.0–8.3)

## 2013-07-16 LAB — BASIC METABOLIC PANEL WITH GFR
BUN: 18 mg/dL (ref 6–23)
CO2: 24 mEq/L (ref 19–32)
Calcium: 9.8 mg/dL (ref 8.4–10.5)
Chloride: 103 mEq/L (ref 96–112)
Creat: 1.14 mg/dL (ref 0.50–1.35)
GFR, Est African American: 80 mL/min
GFR, Est Non African American: 70 mL/min
Glucose, Bld: 138 mg/dL — ABNORMAL HIGH (ref 70–99)
Potassium: 4.3 mEq/L (ref 3.5–5.3)
Sodium: 141 mEq/L (ref 135–145)

## 2013-07-16 LAB — LIPID PANEL
Cholesterol: 168 mg/dL (ref 0–200)
HDL: 34 mg/dL — ABNORMAL LOW (ref >39)
LDL Cholesterol: 69 mg/dL (ref 0–99)
Total CHOL/HDL Ratio: 4.9 Ratio
Triglycerides: 323 mg/dL — ABNORMAL HIGH (ref <150)
VLDL: 65 mg/dL — ABNORMAL HIGH (ref 0–40)

## 2013-07-16 LAB — INSULIN, FASTING: Insulin fasting, serum: 127 u[IU]/mL — ABNORMAL HIGH (ref 3–28)

## 2013-08-12 ENCOUNTER — Other Ambulatory Visit: Payer: Self-pay | Admitting: Neurosurgery

## 2013-08-12 DIAGNOSIS — M4310 Spondylolisthesis, site unspecified: Secondary | ICD-10-CM

## 2013-08-18 ENCOUNTER — Other Ambulatory Visit: Payer: Self-pay | Admitting: Internal Medicine

## 2013-08-18 ENCOUNTER — Other Ambulatory Visit: Payer: Self-pay | Admitting: *Deleted

## 2013-08-18 MED ORDER — SITAGLIPTIN PHOSPHATE 100 MG PO TABS: 100.0000 mg | ORAL_TABLET | Freq: Every day | ORAL | Status: DC

## 2013-08-21 ENCOUNTER — Ambulatory Visit
Admission: RE | Admit: 2013-08-21 | Discharge: 2013-08-21 | Disposition: A | Discharge status: Home / Self Care | Payer: BC Managed Care – PPO | Source: Ambulatory Visit | Attending: Neurosurgery | Admitting: Neurosurgery

## 2013-08-21 DIAGNOSIS — M4310 Spondylolisthesis, site unspecified: Secondary | ICD-10-CM

## 2013-08-25 DIAGNOSIS — B351 Tinea unguium: Secondary | ICD-10-CM

## 2013-08-27 ENCOUNTER — Other Ambulatory Visit: Payer: Self-pay | Admitting: Neurosurgery

## 2013-09-04 ENCOUNTER — Ambulatory Visit: Payer: BC Managed Care – PPO | Admitting: Podiatry

## 2013-09-09 ENCOUNTER — Ambulatory Visit (INDEPENDENT_AMBULATORY_CARE_PROVIDER_SITE_OTHER): Payer: BC Managed Care – PPO | Admitting: Podiatry

## 2013-09-09 DIAGNOSIS — M79676 Pain in unspecified toe(s): Secondary | ICD-10-CM

## 2013-09-09 DIAGNOSIS — B351 Tinea unguium: Secondary | ICD-10-CM

## 2013-09-09 DIAGNOSIS — M79609 Pain in unspecified limb: Secondary | ICD-10-CM

## 2013-09-09 NOTE — Progress Notes (Signed)
He presents today with a chief complaint of painful elongated toenails.  Objective: Nails are thick yellow dystrophic onychomycotic and painful palpation.  Assessment: Pain in limb secondary to onychomycosis 1 through 5 bilateral.  Plan: Debridement of nails 1 through 5 bilateral. 

## 2013-09-10 ENCOUNTER — Other Ambulatory Visit: Payer: Self-pay | Admitting: *Deleted

## 2013-09-10 ENCOUNTER — Other Ambulatory Visit: Payer: Self-pay | Admitting: Internal Medicine

## 2013-09-10 MED ORDER — OMEGA-3-ACID ETHYL ESTERS 1 G PO CAPS: ORAL_CAPSULE | ORAL | Status: DC

## 2013-09-12 ENCOUNTER — Encounter (HOSPITAL_COMMUNITY): Payer: Self-pay | Admitting: Pharmacy Technician

## 2013-09-15 ENCOUNTER — Other Ambulatory Visit: Payer: Self-pay | Admitting: *Deleted

## 2013-09-15 MED ORDER — OMEGA-3-ACID ETHYL ESTERS 1 G PO CAPS: 2.0000 g | ORAL_CAPSULE | Freq: Two times a day (BID) | ORAL | Status: DC

## 2013-09-17 ENCOUNTER — Encounter (HOSPITAL_COMMUNITY): Payer: Self-pay

## 2013-09-17 ENCOUNTER — Other Ambulatory Visit: Payer: Self-pay | Admitting: Internal Medicine

## 2013-09-17 NOTE — Pre-Procedure Instructions (Signed)
Jason Wyatt  09/17/2013   Your procedure is scheduled on:  Fri, Sept 11 @ 9:50 AM  Report to [facility]  at  6:45 AM.  Call this number if you have problems the morning of surgery: [phone]   Remember:   Do not eat food or drink liquids after midnight.   Take these medicines the morning of surgery with A SIP OF WATER: Zovirax(Acyclovir-if needed),Allopurinol(Zyloprim),Colchicine,Cymbalta(Duloxetine),Gabapentin(Neurontin),Prevacid(Lansoprazole),Nasonex(Mometasone-if needed),and Flomax(Tamsulosin)               Stop taking your Fish Oil,Aspirin,and Vitamins. No Goody's,BC's,Aleve,Ibuprofen,or any Herbal Medications   Do not wear jewelry  Do not wear lotions, powders, or colognes. You may wear deodorant.             Men may shave face and neck.  Do not bring valuables to the hospital.  [facility] is not responsible                  for any belongings or valuables.               Contacts, dentures or bridgework may not be worn into surgery.  Leave suitcase in the car. After surgery it may be brought to your room.  For patients admitted to the hospital, discharge time is determined by your                treatment team.                   Special Instructions:  [place] - Preparing for Surgery  Before surgery, you can play an important role.  Because skin is not sterile, your skin needs to be as free of germs as possible.  You can reduce the number of germs on you skin by washing with CHG (chlorahexidine gluconate) soap before surgery.  CHG is an antiseptic cleaner which kills germs and bonds with the skin to continue killing germs even after washing.  Please DO NOT use if you have an allergy to CHG or antibacterial soaps.  If your skin becomes reddened/irritated stop using the CHG and inform your nurse when you arrive at Short Stay.  Do not shave (including legs and underarms) for at least 48 hours prior to the first CHG shower.  You may shave your face.  Please  follow these instructions carefully:   1.  Shower with CHG Soap the night before surgery and the                                morning of Surgery.  2.  If you choose to wash your hair, wash your hair first as usual with your       normal shampoo.  3.  After you shampoo, rinse your hair and body thoroughly to remove the                      Shampoo.  4.  Use CHG as you would any other liquid soap.  You can apply chg directly       to the skin and wash gently with scrungie or a clean washcloth.  5.  Apply the CHG Soap to your body ONLY FROM THE NECK DOWN.        Do not use on open wounds or open sores.  Avoid contact with your eyes,       ears, mouth and genitals (private parts).  Wash genitals (private parts)       with your normal soap.  6.  Wash thoroughly, paying special attention to the area where your surgery        will be performed.  7.  Thoroughly rinse your body with warm water from the neck down.  8.  DO NOT shower/wash with your normal soap after using and rinsing off       the CHG Soap.  9.  Pat yourself dry with a clean towel.            10.  Wear clean pajamas.            11.  Place clean sheets on your bed the night of your first shower and do not        sleep with pets.  Day of Surgery  Do not apply any lotions/deoderants the morning of surgery.  Please wear clean clothes to the hospital/surgery center.     Please read over the following fact sheets that you were given: Pain Booklet, Coughing and Deep Breathing, Blood Transfusion Information, MRSA Information and Surgical Site Infection Prevention

## 2013-09-18 ENCOUNTER — Encounter (HOSPITAL_COMMUNITY): Payer: Self-pay

## 2013-09-18 ENCOUNTER — Encounter (HOSPITAL_COMMUNITY)
Admission: RE | Admit: 2013-09-18 | Discharge: 2013-09-18 | Disposition: A | Discharge status: Home / Self Care | Payer: BC Managed Care – PPO | Source: Ambulatory Visit | Attending: Anesthesiology | Admitting: Anesthesiology

## 2013-09-18 ENCOUNTER — Encounter (HOSPITAL_COMMUNITY)
Admission: RE | Admit: 2013-09-18 | Discharge: 2013-09-18 | Disposition: A | Discharge status: Home / Self Care | Payer: BC Managed Care – PPO | Source: Ambulatory Visit | Attending: Neurosurgery | Admitting: Neurosurgery

## 2013-09-18 DIAGNOSIS — Z01818 Encounter for other preprocedural examination: Secondary | ICD-10-CM | POA: Insufficient documentation

## 2013-09-18 DIAGNOSIS — M48061 Spinal stenosis, lumbar region without neurogenic claudication: Secondary | ICD-10-CM | POA: Insufficient documentation

## 2013-09-18 HISTORY — DX: Other chronic pain: G89.29

## 2013-09-18 HISTORY — DX: Dorsalgia, unspecified: M54.9

## 2013-09-18 HISTORY — DX: Frequency of micturition: R35.0

## 2013-09-18 LAB — BASIC METABOLIC PANEL
Anion gap: 17 — ABNORMAL HIGH (ref 5–15)
BUN: 18 mg/dL (ref 6–23)
CO2: 21 mEq/L (ref 19–32)
Calcium: 9.5 mg/dL (ref 8.4–10.5)
Chloride: 103 mEq/L (ref 96–112)
Creatinine, Ser: 1.04 mg/dL (ref 0.50–1.35)
GFR calc Af Amer: 88 mL/min — ABNORMAL LOW (ref >90)
GFR calc non Af Amer: 76 mL/min — ABNORMAL LOW (ref >90)
Glucose, Bld: 162 mg/dL — ABNORMAL HIGH (ref 70–99)
Potassium: 4.6 mEq/L (ref 3.7–5.3)
Sodium: 141 mEq/L (ref 137–147)

## 2013-09-18 LAB — CBC
HCT: 47.2 % (ref 39.0–52.0)
Hemoglobin: 16.3 g/dL (ref 13.0–17.0)
MCH: 31.6 pg (ref 26.0–34.0)
MCHC: 34.5 g/dL (ref 30.0–36.0)
MCV: 91.5 fL (ref 78.0–100.0)
Platelets: 263 10*3/uL (ref 150–400)
RBC: 5.16 MIL/uL (ref 4.22–5.81)
RDW: 15.3 % (ref 11.5–15.5)
WBC: 9.5 10*3/uL (ref 4.0–10.5)

## 2013-09-18 LAB — TYPE AND SCREEN
ABO/RH(D): O POS
Antibody Screen: NEGATIVE

## 2013-09-18 LAB — ABO/RH: ABO/RH(D): O POS

## 2013-09-18 LAB — SURGICAL PCR SCREEN
MRSA, PCR: NEGATIVE
Staphylococcus aureus: NEGATIVE

## 2013-09-18 NOTE — Progress Notes (Addendum)
  Dr.Peter Martinique is Cardioloigst with last visit several yrs ago-only sees on a prn basis  Heart cath done in 2013 with report in epic  Echo report in epic from 2013  Stress test in epic from 2009  EKG in epic from 04-07-13  Medical Md is Dr.William Melford Aase  Denies CXR in past yr

## 2013-10-09 ENCOUNTER — Telehealth: Payer: Self-pay | Admitting: *Deleted

## 2013-10-09 ENCOUNTER — Encounter (HOSPITAL_COMMUNITY)
Admission: RE | Admit: 2013-10-09 | Discharge: 2013-10-09 | Disposition: A | Discharge status: Home / Self Care | Payer: BC Managed Care – PPO | Source: Ambulatory Visit | Attending: Neurosurgery | Admitting: Neurosurgery

## 2013-10-09 NOTE — Telephone Encounter (Signed)
Beverly from Dr Charlotte Sanes , pain management, called and requested OK for injection in patient's back.  Diabetes and BP managed by meds and OK for injection per Dr Melford Aase.

## 2013-10-13 ENCOUNTER — Encounter (HOSPITAL_COMMUNITY): Admission: RE | Payer: Self-pay | Source: Ambulatory Visit

## 2013-10-13 ENCOUNTER — Inpatient Hospital Stay (HOSPITAL_COMMUNITY): Admission: RE | Admit: 2013-10-13 | Payer: BC Managed Care – PPO | Source: Ambulatory Visit | Admitting: Neurosurgery

## 2013-10-13 SURGERY — FOR MAXIMUM ACCESS (MAS) POSTERIOR LUMBAR INTERBODY FUSION (PLIF) 2 LEVEL
Anesthesia: General | Site: Back

## 2013-10-17 ENCOUNTER — Ambulatory Visit: Payer: Self-pay | Admitting: Physician Assistant

## 2013-10-17 ENCOUNTER — Encounter: Payer: Self-pay | Admitting: Physician Assistant

## 2013-10-17 ENCOUNTER — Ambulatory Visit (INDEPENDENT_AMBULATORY_CARE_PROVIDER_SITE_OTHER): Payer: BC Managed Care – PPO | Admitting: Physician Assistant

## 2013-10-17 VITALS — BP 138/72 | HR 102 | Temp 98.4°F | Resp 18 | Ht 74.0 in | Wt 328.0 lb

## 2013-10-17 DIAGNOSIS — E785 Hyperlipidemia, unspecified: Secondary | ICD-10-CM

## 2013-10-17 DIAGNOSIS — G4733 Obstructive sleep apnea (adult) (pediatric): Secondary | ICD-10-CM

## 2013-10-17 DIAGNOSIS — Z9989 Dependence on other enabling machines and devices: Secondary | ICD-10-CM

## 2013-10-17 DIAGNOSIS — Z23 Encounter for immunization: Secondary | ICD-10-CM

## 2013-10-17 DIAGNOSIS — Z5181 Encounter for therapeutic drug level monitoring: Secondary | ICD-10-CM

## 2013-10-17 DIAGNOSIS — N182 Chronic kidney disease, stage 2 (mild): Secondary | ICD-10-CM

## 2013-10-17 DIAGNOSIS — I10 Essential (primary) hypertension: Secondary | ICD-10-CM

## 2013-10-17 DIAGNOSIS — N189 Chronic kidney disease, unspecified: Secondary | ICD-10-CM

## 2013-10-17 DIAGNOSIS — E559 Vitamin D deficiency, unspecified: Secondary | ICD-10-CM

## 2013-10-17 DIAGNOSIS — E1122 Type 2 diabetes mellitus with diabetic chronic kidney disease: Secondary | ICD-10-CM

## 2013-10-17 MED ORDER — METFORMIN HCL 1000 MG PO TABS: 1000.0000 mg | ORAL_TABLET | Freq: Two times a day (BID) | ORAL | Status: DC

## 2013-10-17 NOTE — Patient Instructions (Signed)
We want weight loss that will last so you should lose 1-2 pounds a week.  THAT IS IT! Please pick THREE things a month to change. Once it is a habit check off the item. Then pick another three items off the list to become habits.  If you are already doing a habit on the list GREAT!  Cross that item off! o Don't drink your calories. Ie, alcohol, soda, fruit juice, and sweet tea.  o Drink more water. Drink a glass when you feel hungry or before each meal.  o Eat breakfast - Complex carb and protein (likeDannon light and fit yogurt, oatmeal, fruit, eggs, [place] bacon). o Measure your cereal.  Eat no more than one cup a day. (ie [place]) o Eat an apple a day. o Add a vegetable a day. o Try a new vegetable a month. o Use Pam! Stop using oil or butter to cook. o Don't finish your plate or use smaller plates. o Share your dessert. o Eat sugar free Jello for dessert or frozen grapes. o Don't eat 2-3 hours before bed. o Switch to whole wheat bread, pasta, and brown rice. o Make healthier choices when you eat out. No fries! o Pick baked chicken, NOT fried. o Don't forget to SLOW DOWN when you eat. It is not going anywhere.  o Take the stairs. o Park far away in the parking lot o [employer] (or weights) for 10 minutes while watching TV. o Walk at work for 10 minutes during break. o Walk outside 1 time a week with your friend, kids, dog, or significant other. o Start a walking group at [place] the mall as much as you can tolerate.  o Keep a food diary. o Weigh yourself daily. o Walk for 15 minutes 3 days per week. o Cook at home more often and eat out less.  If life happens and you go back to old habits, it is okay.  Just start over. You can do it!   If you experience chest pain, get short of breath, or tired during the exercise, please stop immediately and inform your doctor.     Bad carbs also include fruit juice, alcohol, and sweet tea. These are empty calories that do not signal to  your brain that you are full.   Please remember the good carbs are still carbs which convert into sugar. So please measure them out no more than 1/2-1 cup of rice, oatmeal, pasta, and beans.  Veggies are however free foods! Pile them on.   I like lean protein at every meal such as chicken, [place], pork chops, cottage cheese, etc. Just do not fry these meats and please center your meal around vegetable, the meats should be a side dish.   No all fruit is created equal. Please see the list below, the fruit at the bottom is higher in sugars than the fruit at the top   Recommendations For Diabetic Patients:   -  Take medications as prescribed  -  Recommend Dr Fara Olden Fuhrman's book "The End of Diabetes " - Can get at  www.[employer].com and encourage also get the Audio CD book  - AVOID Animal products, ie. Meat - red/white, Poultry and Dairy/especially cheese - Exercise at least 5 times a week for 30 minutes or preferably daily.  - No Smoking - Drink less than 2 drinks a day.  - Monitor your feet for sores - Have yearly Eye Exams - Recommend annual Flu vaccine  -  Recommend Pneumovax and Prevnar vaccines - Shingles Vaccine (Zostavax) if over 60 y.o.  Goals:   - BMI less than 24 - Fasting sugar less than 130 or less than 150 if tapering medicines to lose weight  - Systolic BP less than 130  - Diastolic BP less than 80 - Bad LDL Cholesterol less than 70 - Triglycerides less than 150   

## 2013-10-17 NOTE — Progress Notes (Signed)
Assessment and Plan:  Hypertension: Continue medication, monitor blood pressure at home. Continue DASH diet. Cholesterol: Continue diet and exercise. Check cholesterol.  Diabetes-Continue diet and exercise. Check A1C Vitamin D Def- check level and continue medications.  Obesity with co morbidities- long discussion about weight loss, diet, and exercise   Continue diet and meds as discussed. Further disposition pending results of labs. Discussed med's effects and SE's.    HPI 58 y.o. male  presents for 3 month follow up with hypertension, hyperlipidemia, diabetes and vitamin D. His blood pressure has been controlled at home, today their BP is BP: 138/72 mmHg He does not workout. He denies chest pain, shortness of breath, dizziness.  He is on cholesterol medication and denies myalgias. His cholesterol is at goal. The cholesterol last visit was:   Lab Results  Component Value Date   CHOL 168 07/15/2013   HDL 34* 07/15/2013   LDLCALC 69 07/15/2013   TRIG 323* 07/15/2013   CHOLHDL 4.9 07/15/2013   He has been working on diet and exercise for Diabetes, and denies paresthesia of the feet, polydipsia and polyuria. Last A1C in the office was:  Lab Results  Component Value Date   HGBA1C 6.8* 07/15/2013   Patient is on Vitamin D supplement. Lab Results  Component Value Date   VD25OH 80 04/07/2013     BMI is Body mass index is 42.09 kg/(m^2)., he is working on diet.  Wt Readings from Last 3 Encounters:  10/17/13 328 lb (148.78 kg)  09/18/13 332 lb (150.594 kg)  07/15/13 333 lb 12.8 oz (151.411 kg)   He is suppose to be having a lumbar fusion with Dr. Vertell Limber but this has been canceled 2 times. He had injection with Dr. Primus Bravo which he states has helped. He walks with a cane, he states he can live with the pain but the numbness/tingling is debilitating. He would like a second opinion for this and would like see a doctor at [facility].  Current Medications:  Current Outpatient Prescriptions on File  Prior to Visit  Medication Sig Dispense Refill  . acyclovir (ZOVIRAX) 400 MG tablet Take 400 mg by mouth daily as needed (for fever blisters).      [name] allopurinol (ZYLOPRIM) 300 MG tablet Take 300 mg by mouth daily.      [name] aspirin EC 81 MG tablet Take 81 mg by mouth every morning.      . carbidopa-levodopa (SINEMET IR) 25-250 MG per tablet Take 1 tablet by mouth every evening.      . colchicine 0.6 MG tablet Take 0.6 mg by mouth 2 (two) times daily as needed (gout flare ups).       . Dapagliflozin Propanediol (FARXIGA) 10 MG TABS Take 10 mg by mouth daily.      . DULoxetine (CYMBALTA) 60 MG capsule Take 60 mg by mouth daily.      . enalapril (VASOTEC) 20 MG tablet Take 20 mg by mouth 2 (two) times daily.      [name] gabapentin (NEURONTIN) 300 MG capsule TAKE 1 BY MOUTH 3 TIMES DAILY  90 capsule  0  . lansoprazole (PREVACID) 30 MG capsule Take 1 capsule (30 mg total) by mouth daily. Patient uses this medication for heartburn.  90 capsule  3  . Magnesium 250 MG TABS Take 250 mg by mouth daily.       . metFORMIN (GLUCOPHAGE) 1000 MG tablet Take 1,000 mg by mouth 2 (two) times daily with a meal.      .  mometasone (NASONEX) 50 MCG/ACT nasal spray Place 2 sprays into the nose daily as needed (allergies).      . Multiple Vitamin (MULTIVITAMIN WITH MINERALS) TABS Take 1 tablet by mouth daily.      [name] omega-3 acid ethyl esters (LOVAZA) 1 G capsule Take 2 capsules (2 g total) by mouth 2 (two) times daily.  360 capsule  1  . sitaGLIPtin (JANUVIA) 100 MG tablet Take 100 mg by mouth daily.      . Tamsulosin HCl (FLOMAX) 0.4 MG CAPS Take 0.4 mg by mouth daily.       . Vitamin D, Ergocalciferol, (DRISDOL) 50000 UNITS CAPS capsule Take 50,000 Units by mouth every other day.       No current facility-administered medications on file prior to visit.   Medical History:  Past Medical History  Diagnosis Date  . Gout     takes Allopurinol daily and Colchicine if needed  . Complication of anesthesia 06/2010    "woke  up next day w/ventilator on"  . High cholesterol     takes Fish Oil daily  . Exertional dyspnea 08/03/11  . Arthritis     "in my back"  . Prostate cancer   . Urinary frequency     takes Flomax daily  . Type II diabetes mellitus     takes Farxiga,Januvia,and Metformin daily  . GERD (gastroesophageal reflux disease)     takes Prevacid daily  . Hypertension     takes Enalapril daily  . Peripheral neuropathy     takes Sinemet daily  . Thyroid nodule     left side  . OSA (obstructive sleep apnea)     sleep study done about 76yrs ago;uses CPAP  . Weakness     numbness and tingling  . Joint pain   . Chronic back pain     stenosis  . History of colon polyps   . History of blood transfusion    Allergies:  Allergies  Allergen Reactions  . Flaxseed [Linseed Oil] Rash  . Lyrica [Pregabalin] Itching and Rash  . Niaspan [Niacin Er] Rash     Review of Systems: [X]  = complains of  [ ]  = denies  General: Fatigue [ ]  Fever [ ]  Chills [ ]  Weakness [ ]   Insomnia [ ]  Eyes: Redness [ ]  Blurred vision [ ]  Diplopia [ ]   ENT: Congestion [ ]  Sinus Pain [ ]  Post Nasal Drip [ ]  Sore Throat [ ]  Earache [ ]   Cardiac: Chest pain/pressure [ ]  SOB [ ]  Orthopnea [ ]   Palpitations [ ]   Paroxysmal nocturnal dyspnea[ ]  Claudication [ ]  Edema [ ]   Pulmonary: Cough [ ]  Wheezing[ ]   SOB [ ]   Snoring [ ]   GI: Nausea [ ]  Vomiting[ ]  Dysphagia[ ]  Heartburn[ ]  Abdominal pain [ ]  Constipation [ ] ; Diarrhea [ ] ; BRBPR [ ]  Melena[ ]  GU: Hematuria[ ]  Dysuria [ ]  Nocturia[ ]  Urgency [ ]   Hesitancy [ ]  Discharge [ ]  Neuro: Headaches[ ]  Vertigo[ ]  Paresthesias[ ]  Spasm [ ]  Speech changes [ ]  Incoordination [ ]   Ortho: Arthritis [ ]  Joint pain [ ]  Muscle pain [ ]  Joint swelling [ ]  Back Pain [ ]  Skin:  Rash [ ]   Pruritis [ ]  Change in skin lesion [ ]   Psych: Depression[ ]  Anxiety[ ]  Confusion [ ]  Memory loss [ ]   Heme/Lypmh: Bleeding [ ]  Bruising [ ]  Enlarged lymph nodes [ ]   Endocrine: Visual blurring [ ]  Paresthesia  [ ]   Polyuria [ ]  Polydypsea [ ]    Heat/cold intolerance [ ]  Hypoglycemia [ ]   Family history- Review and unchanged Social history- Review and unchanged Physical Exam: BP 138/72  Pulse 102  Temp(Src) 98.4 F (36.9 C) (Temporal)  Resp 18  Ht 6\' 2"  (1.88 m)  Wt 328 lb (148.78 kg)  BMI 42.09 kg/m2 Wt Readings from Last 3 Encounters:  10/17/13 328 lb (148.78 kg)  09/18/13 332 lb (150.594 kg)  07/15/13 333 lb 12.8 oz (151.411 kg)   General Appearance: Well nourished, in no apparent distress. Eyes: PERRLA, EOMs, conjunctiva no swelling or erythema Sinuses: No Frontal/maxillary tenderness ENT/Mouth: Ext aud canals clear, TMs without erythema, bulging. No erythema, swelling, or exudate on post pharynx.  Tonsils not swollen or erythematous. Hearing normal.  Neck: Supple, thyroid normal.  Respiratory: Respiratory effort normal, BS equal bilaterally without rales, rhonchi, wheezing or stridor.  Cardio: RRR with no MRGs. Brisk peripheral pulses without edema.  Abdomen: Soft, + BS, obese  Non tender, no guarding, rebound, hernias, masses. Lymphatics: Non tender without lymphadenopathy.  Musculoskeletal: Full ROM, 5/5 strength, antalgic gait with cane Skin: Warm, dry without rashes, lesions, ecchymosis.  Neuro: Cranial nerves intact. No cerebellar symptoms. Sensation decreased bilateral feet Psych: Awake and oriented X 3, normal affect, Insight and Judgment appropriate.    Vicie Mutters 11:05 AM

## 2013-10-18 LAB — HEPATIC FUNCTION PANEL
ALT: 35 U/L (ref 0–53)
AST: 23 U/L (ref 0–37)
Albumin: 4.3 g/dL (ref 3.5–5.2)
Alkaline Phosphatase: 64 U/L (ref 39–117)
Bilirubin, Direct: 0.1 mg/dL (ref 0.0–0.3)
Indirect Bilirubin: 0.4 mg/dL (ref 0.2–1.2)
Total Bilirubin: 0.5 mg/dL (ref 0.2–1.2)
Total Protein: 6.7 g/dL (ref 6.0–8.3)

## 2013-10-18 LAB — CBC WITH DIFFERENTIAL/PLATELET
Basophils Absolute: 0 10*3/uL (ref 0.0–0.1)
Basophils Relative: 0 % (ref 0–1)
Eosinophils Absolute: 0.1 10*3/uL (ref 0.0–0.7)
Eosinophils Relative: 1 % (ref 0–5)
HCT: 45.1 % (ref 39.0–52.0)
Hemoglobin: 15.6 g/dL (ref 13.0–17.0)
Lymphocytes Relative: 18 % (ref 12–46)
Lymphs Abs: 2.3 10*3/uL (ref 0.7–4.0)
MCH: 30.8 pg (ref 26.0–34.0)
MCHC: 34.6 g/dL (ref 30.0–36.0)
MCV: 89.1 fL (ref 78.0–100.0)
Monocytes Absolute: 0.6 10*3/uL (ref 0.1–1.0)
Monocytes Relative: 5 % (ref 3–12)
Neutro Abs: 9.6 10*3/uL — ABNORMAL HIGH (ref 1.7–7.7)
Neutrophils Relative %: 76 % (ref 43–77)
Platelets: 283 10*3/uL (ref 150–400)
RBC: 5.06 MIL/uL (ref 4.22–5.81)
RDW: 16.1 % — ABNORMAL HIGH (ref 11.5–15.5)
WBC: 12.6 10*3/uL — ABNORMAL HIGH (ref 4.0–10.5)

## 2013-10-18 LAB — BASIC METABOLIC PANEL WITH GFR
BUN: 20 mg/dL (ref 6–23)
CO2: 21 mEq/L (ref 19–32)
Calcium: 9.5 mg/dL (ref 8.4–10.5)
Chloride: 104 mEq/L (ref 96–112)
Creat: 1.08 mg/dL (ref 0.50–1.35)
GFR, Est African American: 86 mL/min
GFR, Est Non African American: 74 mL/min
Glucose, Bld: 176 mg/dL — ABNORMAL HIGH (ref 70–99)
Potassium: 4.2 mEq/L (ref 3.5–5.3)
Sodium: 136 mEq/L (ref 135–145)

## 2013-10-18 LAB — HEMOGLOBIN A1C
Hgb A1c MFr Bld: 7.1 % — ABNORMAL HIGH (ref <5.7)
Mean Plasma Glucose: 157 mg/dL — ABNORMAL HIGH (ref <117)

## 2013-10-18 LAB — LIPID PANEL
Cholesterol: 172 mg/dL (ref 0–200)
HDL: 41 mg/dL (ref >39)
LDL Cholesterol: 85 mg/dL (ref 0–99)
Total CHOL/HDL Ratio: 4.2 Ratio
Triglycerides: 232 mg/dL — ABNORMAL HIGH (ref <150)
VLDL: 46 mg/dL — ABNORMAL HIGH (ref 0–40)

## 2013-10-18 LAB — TSH: TSH: 2.036 u[IU]/mL (ref 0.350–4.500)

## 2013-10-18 LAB — VITAMIN D 25 HYDROXY (VIT D DEFICIENCY, FRACTURES): Vit D, 25-Hydroxy: 65 ng/mL (ref 30–89)

## 2013-10-18 LAB — MAGNESIUM: Magnesium: 1.8 mg/dL (ref 1.5–2.5)

## 2013-11-03 ENCOUNTER — Other Ambulatory Visit: Payer: Self-pay | Admitting: Internal Medicine

## 2013-12-05 ENCOUNTER — Encounter: Payer: Self-pay | Admitting: *Deleted

## 2013-12-16 ENCOUNTER — Encounter: Payer: Self-pay | Admitting: Podiatry

## 2013-12-16 ENCOUNTER — Ambulatory Visit (INDEPENDENT_AMBULATORY_CARE_PROVIDER_SITE_OTHER): Payer: BC Managed Care – PPO | Admitting: Podiatry

## 2013-12-16 DIAGNOSIS — M79676 Pain in unspecified toe(s): Secondary | ICD-10-CM

## 2013-12-16 DIAGNOSIS — B351 Tinea unguium: Secondary | ICD-10-CM

## 2013-12-16 NOTE — Progress Notes (Signed)
He presents today with a chief complaint of painful elongated toenails.  Objective: Nails are thick yellow dystrophic onychomycotic and painful palpation.  Assessment: Pain in limb secondary to onychomycosis 1 through 5 bilateral.  Plan: Debridement of nails 1 through 5 bilateral. 

## 2013-12-18 ENCOUNTER — Ambulatory Visit (INDEPENDENT_AMBULATORY_CARE_PROVIDER_SITE_OTHER): Payer: BC Managed Care – PPO

## 2013-12-18 ENCOUNTER — Ambulatory Visit (INDEPENDENT_AMBULATORY_CARE_PROVIDER_SITE_OTHER): Payer: BC Managed Care – PPO | Admitting: Podiatry

## 2013-12-18 VITALS — BP 117/63 | HR 84 | Resp 16

## 2013-12-18 DIAGNOSIS — M779 Enthesopathy, unspecified: Secondary | ICD-10-CM

## 2013-12-18 DIAGNOSIS — M7662 Achilles tendinitis, left leg: Secondary | ICD-10-CM

## 2013-12-18 DIAGNOSIS — M7661 Achilles tendinitis, right leg: Secondary | ICD-10-CM

## 2013-12-18 NOTE — Progress Notes (Signed)
He presents today complaining of bilateral Achilles tendon pain. States that he just started Tuesday afternoon. Denies any trauma or overuse.  Objective: pain pulses are palpable bilateral. He has tenderness on palpation of the watershed area of the Achilles tendon bilaterally. Radiographic evaluation demonstrates spurs to the posterior aspect of the calcaneus. Thickened Achilles tendon is also noted.  Assessment: Achilles tendinopathy bilateral.  Plan: Injected the subcutaneous tissue today with dexamethasone and local anesthetic 2 mg was injected to bilateral Achilles subcutaneously. I will follow-up with him as needed.

## 2013-12-25 ENCOUNTER — Encounter (HOSPITAL_COMMUNITY): Payer: Self-pay | Admitting: Cardiology

## 2014-01-21 ENCOUNTER — Other Ambulatory Visit: Payer: Self-pay | Admitting: *Deleted

## 2014-01-21 MED ORDER — DAPAGLIFLOZIN PROPANEDIOL 10 MG PO TABS: 10.0000 mg | ORAL_TABLET | Freq: Every day | ORAL | Status: DC

## 2014-01-21 MED ORDER — GABAPENTIN 300 MG PO CAPS: ORAL_CAPSULE | ORAL | Status: DC

## 2014-01-21 MED ORDER — CARBIDOPA-LEVODOPA 25-250 MG PO TABS: ORAL_TABLET | ORAL | Status: DC

## 2014-02-03 ENCOUNTER — Encounter: Payer: Self-pay | Admitting: Physician Assistant

## 2014-02-03 ENCOUNTER — Other Ambulatory Visit: Payer: Self-pay

## 2014-02-03 ENCOUNTER — Ambulatory Visit (INDEPENDENT_AMBULATORY_CARE_PROVIDER_SITE_OTHER): Payer: BLUE CROSS/BLUE SHIELD | Admitting: Physician Assistant

## 2014-02-03 VITALS — BP 122/78 | HR 100 | Temp 97.7°F | Resp 16 | Ht 74.0 in | Wt 329.0 lb

## 2014-02-03 DIAGNOSIS — Z9989 Dependence on other enabling machines and devices: Secondary | ICD-10-CM

## 2014-02-03 DIAGNOSIS — N182 Chronic kidney disease, stage 2 (mild): Secondary | ICD-10-CM

## 2014-02-03 DIAGNOSIS — E559 Vitamin D deficiency, unspecified: Secondary | ICD-10-CM

## 2014-02-03 DIAGNOSIS — E785 Hyperlipidemia, unspecified: Secondary | ICD-10-CM

## 2014-02-03 DIAGNOSIS — E1122 Type 2 diabetes mellitus with diabetic chronic kidney disease: Secondary | ICD-10-CM

## 2014-02-03 DIAGNOSIS — M4326 Fusion of spine, lumbar region: Secondary | ICD-10-CM

## 2014-02-03 DIAGNOSIS — I10 Essential (primary) hypertension: Secondary | ICD-10-CM

## 2014-02-03 DIAGNOSIS — Z79899 Other long term (current) drug therapy: Secondary | ICD-10-CM

## 2014-02-03 DIAGNOSIS — M5442 Lumbago with sciatica, left side: Secondary | ICD-10-CM

## 2014-02-03 DIAGNOSIS — G4733 Obstructive sleep apnea (adult) (pediatric): Secondary | ICD-10-CM

## 2014-02-03 DIAGNOSIS — M5441 Lumbago with sciatica, right side: Secondary | ICD-10-CM

## 2014-02-03 DIAGNOSIS — N189 Chronic kidney disease, unspecified: Secondary | ICD-10-CM

## 2014-02-03 LAB — CBC WITH DIFFERENTIAL/PLATELET
Basophils Absolute: 0.1 10*3/uL (ref 0.0–0.1)
Basophils Relative: 1 % (ref 0–1)
Eosinophils Absolute: 0.2 10*3/uL (ref 0.0–0.7)
Eosinophils Relative: 2 % (ref 0–5)
HCT: 47 % (ref 39.0–52.0)
Hemoglobin: 16.3 g/dL (ref 13.0–17.0)
Lymphocytes Relative: 23 % (ref 12–46)
Lymphs Abs: 1.8 10*3/uL (ref 0.7–4.0)
MCH: 31.3 pg (ref 26.0–34.0)
MCHC: 34.7 g/dL (ref 30.0–36.0)
MCV: 90.2 fL (ref 78.0–100.0)
MPV: 9.5 fL (ref 8.6–12.4)
Monocytes Absolute: 0.6 10*3/uL (ref 0.1–1.0)
Monocytes Relative: 7 % (ref 3–12)
Neutro Abs: 5.4 10*3/uL (ref 1.7–7.7)
Neutrophils Relative %: 67 % (ref 43–77)
Platelets: 291 10*3/uL (ref 150–400)
RBC: 5.21 MIL/uL (ref 4.22–5.81)
RDW: 15.6 % — ABNORMAL HIGH (ref 11.5–15.5)
WBC: 8 10*3/uL (ref 4.0–10.5)

## 2014-02-03 LAB — BASIC METABOLIC PANEL WITH GFR
BUN: 13 mg/dL (ref 6–23)
CO2: 21 mEq/L (ref 19–32)
Calcium: 10.3 mg/dL (ref 8.4–10.5)
Chloride: 104 mEq/L (ref 96–112)
Creat: 1.03 mg/dL (ref 0.50–1.35)
GFR, Est African American: >89 mL/min
GFR, Est Non African American: 79 mL/min
Glucose, Bld: 159 mg/dL — ABNORMAL HIGH (ref 70–99)
Potassium: 4.5 mEq/L (ref 3.5–5.3)
Sodium: 140 mEq/L (ref 135–145)

## 2014-02-03 LAB — MAGNESIUM: Magnesium: 1.8 mg/dL (ref 1.5–2.5)

## 2014-02-03 LAB — LIPID PANEL
Cholesterol: 166 mg/dL (ref 0–200)
HDL: 30 mg/dL — ABNORMAL LOW (ref >39)
Total CHOL/HDL Ratio: 5.5 Ratio
Triglycerides: 480 mg/dL — ABNORMAL HIGH (ref <150)

## 2014-02-03 LAB — TSH: TSH: 3.097 u[IU]/mL (ref 0.350–4.500)

## 2014-02-03 LAB — HEPATIC FUNCTION PANEL
ALT: 49 U/L (ref 0–53)
AST: 45 U/L — ABNORMAL HIGH (ref 0–37)
Albumin: 4.4 g/dL (ref 3.5–5.2)
Alkaline Phosphatase: 75 U/L (ref 39–117)
Bilirubin, Direct: 0.1 mg/dL (ref 0.0–0.3)
Indirect Bilirubin: 0.5 mg/dL (ref 0.2–1.2)
Total Bilirubin: 0.6 mg/dL (ref 0.2–1.2)
Total Protein: 7.3 g/dL (ref 6.0–8.3)

## 2014-02-03 LAB — HEMOGLOBIN A1C
Hgb A1c MFr Bld: 7.8 % — ABNORMAL HIGH (ref <5.7)
Mean Plasma Glucose: 177 mg/dL — ABNORMAL HIGH (ref <117)

## 2014-02-03 NOTE — Patient Instructions (Signed)
Diabetes is a very complicated disease...lets simplify it.  An easy way to look at it to understand the complications is if you think of the extra sugar floating in your blood stream as glass shards floating through your blood stream.    Diabetes affects your small vessels first: 1) The glass shards (sugar) scraps down the tiny blood vessels in your eyes and lead to diabetic retinopathy, the leading cause of blindness in the [place]. Diabetes is the leading cause of newly diagnosed adult (20 to 61 years of age) blindness in the [place].  2) The glass shards scratches down the tiny vessels of your legs leading to nerve damage called neuropathy and can lead to amputations of your feet. More than 60% of all non-traumatic amputations of lower limbs occur in people with diabetes.  3) Over time the small vessels in your brain are shredded and closed off, individually this does not cause any problems but over a long period of time many of the small vessels being blocked can lead to Vascular Dementia.   4) Your kidney's are a filter system and have a "net" that keeps certain things in the body and lets bad things out. Sugar shreds this net and leads to kidney damage and eventually failure. Decreasing the sugar that is destroying the net and certain blood pressure medications can help stop or decrease progression of kidney disease. Diabetes was the primary cause of kidney failure in 44 percent of all new cases in 2011.  5) Diabetes also destroys the small vessels in your penis that lead to erectile dysfunction. Eventually the vessels are so damaged that you may not be responsive to cialis or viagra.   Diabetes and your large vessels: Your larger vessels consist of your coronary arteries in your heart and the carotid vessels to your brain. Diabetes or even increased sugars put you at 300% increased risk of heart attack and stroke and this is why.. The sugar scrapes down your large blood vessels and your body  sees this as an internal injury and tries to repair itself. Just like you get a scab on your skin, your platelets will stick to the blood vessel wall trying to heal it. This is why we have diabetics on low dose aspirin daily, this prevents the platelets from sticking and can prevent plaque formation. In addition, your body takes cholesterol and tries to shove it into the open wound. This is why we want your LDL, or bad cholesterol, below 70.   The combination of platelets and cholesterol over 5-10 years forms plaque that can break off and cause a heart attack or stroke.   PLEASE REMEMBER:  Diabetes is preventable! Up to 85 percent of complications and morbidities among individuals with type 2 diabetes can be prevented, delayed, or effectively treated and minimized with regular visits to a health professional, appropriate monitoring and medication, and a healthy diet and lifestyle.     Bad carbs also include fruit juice, alcohol, and sweet tea. These are empty calories that do not signal to your brain that you are full.   Please remember the good carbs are still carbs which convert into sugar. So please measure them out no more than 1/2-1 cup of rice, oatmeal, pasta, and beans  Veggies are however free foods! Pile them on.   Not all fruit is created equal. Please see the list below, the fruit at the bottom is higher in sugars than the fruit at the top. Please avoid all dried fruits.     

## 2014-02-03 NOTE — Progress Notes (Addendum)
Assessment and Plan:  Hypertension: Continue medication, monitor blood pressure at home. Continue DASH diet.  Reminder to go to the ER if any CP, SOB, nausea, dizziness, severe HA, changes vision/speech, left arm numbness and tingling and jaw pain. Cholesterol: Continue diet and exercise. Check cholesterol.  Diabetes with diabetic chronic kidney disease-Continue diet and exercise. Check A1C Vitamin D Def- check level and continue medications.  Obesity with co morbidities- long discussion about weight loss, diet, and exercise Back pain- will send in referral to for second opinion for back pain  Continue diet and meds as discussed. Further disposition pending results of labs. Discussed med's effects and SE's.   HPI 59 y.o. male  presents for 3 month follow up with hypertension, hyperlipidemia, diabetes and vitamin D. His blood pressure has been controlled at home, today their BP is BP: 122/78 mmHg He does not workout due to back pain, he has been seeing Dr. Vertell Limber and wants to get a second opinion with Dr. Patrice Paradise. He denies chest pain, shortness of breath, dizziness.  He is on cholesterol medication, lovaza and denies myalgias. His cholesterol is not at goal. The cholesterol was:  10/17/2013: Cholesterol, Total 172; HDL Cholesterol by NMR 41; LDL (calc) 85; Triglycerides 232* He has been working on diet and exercise for Diabetes with diabetic chronic kidney disease, he is on bASA, he is on ACE/ARB, and denies paresthesia of the feet, polydipsia, polyuria and visual disturbances. Last A1C was: 10/17/2013: Hemoglobin-A1c 7.1*  Patient is on Vitamin D supplement. 10/17/2013: Vit D, 25-Hydroxy 65  BMI is Body mass index is 42.22 kg/(m^2)., he is working on diet and exercise. Wt Readings from Last 3 Encounters:  02/03/14 329 lb (149.233 kg)  10/17/13 328 lb (148.78 kg)  07/15/13 333 lb 12.8 oz (151.411 kg)    Current Medications:  Current Outpatient Prescriptions on File Prior to Visit  Medication Sig  Dispense Refill  . acyclovir (ZOVIRAX) 400 MG tablet Take 400 mg by mouth daily as needed (for fever blisters).    [name] allopurinol (ZYLOPRIM) 300 MG tablet Take 300 mg by mouth daily.    [name] aspirin EC 81 MG tablet Take 81 mg by mouth every morning.    . carbidopa-levodopa (SINEMET IR) 25-250 MG per tablet Take 1 tablet by mouth twice daily for restless legs. 180 tablet 4  . colchicine 0.6 MG tablet Take 0.6 mg by mouth 2 (two) times daily as needed (gout flare ups).     . dapagliflozin propanediol (FARXIGA) 10 MG TABS tablet Take 10 mg by mouth daily. 90 tablet 4  . DULoxetine (CYMBALTA) 60 MG capsule Take 60 mg by mouth daily.    . enalapril (VASOTEC) 20 MG tablet Take 20 mg by mouth 2 (two) times daily.    [name] gabapentin (NEURONTIN) 300 MG capsule TAKE 1 BY MOUTH 3 TIMES DAILY 270 capsule 4  . lansoprazole (PREVACID) 30 MG capsule TAKE 1 BY MOUTH DAILY FOR HEARTBURN 90 capsule 0  . metFORMIN (GLUCOPHAGE) 1000 MG tablet Take 1 tablet (1,000 mg total) by mouth 2 (two) times daily with a meal. 180 tablet 1  . mometasone (NASONEX) 50 MCG/ACT nasal spray Place 2 sprays into the nose daily as needed (allergies).    . Multiple Vitamin (MULTIVITAMIN WITH MINERALS) TABS Take 1 tablet by mouth daily.    [name] omega-3 acid ethyl esters (LOVAZA) 1 G capsule Take 2 capsules (2 g total) by mouth 2 (two) times daily. 360 capsule 1  . sitaGLIPtin (JANUVIA) 100 MG tablet  Take 100 mg by mouth daily.    . Vitamin D, Ergocalciferol, (DRISDOL) 50000 UNITS CAPS capsule Take 50,000 Units by mouth every other day.     No current facility-administered medications on file prior to visit.   Medical History:  Past Medical History  Diagnosis Date  . Gout     takes Allopurinol daily and Colchicine if needed  . Complication of anesthesia 06/2010    "woke up next day w/ventilator on"  . High cholesterol     takes Fish Oil daily  . Exertional dyspnea 08/03/11  . Arthritis     "in my back"  . Prostate cancer   . Urinary  frequency     takes Flomax daily  . Type II diabetes mellitus     takes Farxiga,Januvia,and Metformin daily  . GERD (gastroesophageal reflux disease)     takes Prevacid daily  . Hypertension     takes Enalapril daily  . Peripheral neuropathy     takes Sinemet daily  . Thyroid nodule     left side  . OSA (obstructive sleep apnea)     sleep study done about 28yrs ago;uses CPAP  . Weakness     numbness and tingling  . Joint pain   . Chronic back pain     stenosis  . History of colon polyps   . History of blood transfusion    Allergies:  Allergies  Allergen Reactions  . Flaxseed [Linseed Oil] Rash  . Lyrica [Pregabalin] Itching and Rash  . Niaspan [Niacin Er] Rash     Review of Systems:  Review of Systems  Constitutional: Negative.   HENT: Negative.   Respiratory: Negative.   Cardiovascular: Negative.   Gastrointestinal: Negative.   Genitourinary: Negative.   Musculoskeletal: Positive for back pain. Negative for myalgias, joint pain, falls and neck pain.  Skin: Negative.   Neurological: Positive for tingling and sensory change. Negative for dizziness, tremors, speech change, focal weakness, seizures and loss of consciousness.  Psychiatric/Behavioral: Negative.     Family history- Review and unchanged Social history- Review and unchanged Physical Exam: BP 122/78 mmHg  Pulse 100  Temp(Src) 97.7 F (36.5 C)  Resp 16  Ht 6\' 2"  (1.88 m)  Wt 329 lb (149.233 kg)  BMI 42.22 kg/m2 Wt Readings from Last 3 Encounters:  02/03/14 329 lb (149.233 kg)  10/17/13 328 lb (148.78 kg)  07/15/13 333 lb 12.8 oz (151.411 kg)   General Appearance: Well nourished, in no apparent distress. Eyes: PERRLA, EOMs, conjunctiva no swelling or erythema Sinuses: No Frontal/maxillary tenderness ENT/Mouth: Ext aud canals clear, TMs without erythema, bulging. No erythema, swelling, or exudate on post pharynx.  Tonsils not swollen or erythematous. Hearing normal.  Neck: Supple, thyroid normal.   Respiratory: Respiratory effort normal, BS equal bilaterally without rales, rhonchi, wheezing or stridor.  Cardio: RRR with no MRGs. Brisk peripheral pulses without edema.  Abdomen: Soft, + BS, obese  Non tender, no guarding, rebound, hernias, masses. Lymphatics: Non tender without lymphadenopathy.  Musculoskeletal: Full ROM, 5/5 strength, antalgic gait with cane Skin: Warm, dry without rashes, lesions, ecchymosis.  Neuro: Cranial nerves intact. No cerebellar symptoms. Sensation decreased bilateral feet Psych: Awake and oriented X 3, normal affect, Insight and Judgment appropriate.     Vicie Mutters, PA-C 12:33 PM [facility]

## 2014-02-04 LAB — VITAMIN D 25 HYDROXY (VIT D DEFICIENCY, FRACTURES): Vit D, 25-Hydroxy: 57 ng/mL (ref 30–100)

## 2014-02-13 ENCOUNTER — Telehealth: Payer: Self-pay | Admitting: *Deleted

## 2014-02-13 NOTE — Telephone Encounter (Signed)
STATUS OF REFERRAL TO [facility] = AMANDA SAID REFERRAL LAST TIME HE WAS HERE PLEASE CALL PT TO LET HIM [place]

## 2014-03-16 ENCOUNTER — Other Ambulatory Visit: Payer: Self-pay

## 2014-03-16 MED ORDER — CANAGLIFLOZIN 300 MG PO TABS: 300.0000 mg | ORAL_TABLET | Freq: Every day | ORAL | Status: DC

## 2014-03-16 MED ORDER — SAXAGLIPTIN HCL 5 MG PO TABS: 5.0000 mg | ORAL_TABLET | Freq: Every day | ORAL | Status: DC

## 2014-03-16 NOTE — Telephone Encounter (Signed)
Patients insurance has denied [place], patient is first required to try the [place] and [place], RX for both sent to patients pharmacy, patient aware

## 2014-03-17 ENCOUNTER — Ambulatory Visit (INDEPENDENT_AMBULATORY_CARE_PROVIDER_SITE_OTHER): Payer: BLUE CROSS/BLUE SHIELD | Admitting: Podiatry

## 2014-03-17 ENCOUNTER — Encounter: Payer: Self-pay | Admitting: Podiatry

## 2014-03-17 DIAGNOSIS — M79673 Pain in unspecified foot: Secondary | ICD-10-CM | POA: Diagnosis not present

## 2014-03-17 DIAGNOSIS — B351 Tinea unguium: Secondary | ICD-10-CM | POA: Diagnosis not present

## 2014-03-17 NOTE — Progress Notes (Signed)
He presents today with a chief complaint of painful elongated toenails.  Objective: Nails are thick yellow dystrophic onychomycotic and painful palpation.  Assessment: Pain in limb secondary to onychomycosis 1 through 5 bilateral.  Plan: Debridement of nails 1 through 5 bilateral.

## 2014-04-08 ENCOUNTER — Encounter: Payer: Self-pay | Admitting: Emergency Medicine

## 2014-04-13 ENCOUNTER — Other Ambulatory Visit: Payer: Self-pay | Admitting: Internal Medicine

## 2014-04-20 ENCOUNTER — Other Ambulatory Visit: Payer: Self-pay | Admitting: Internal Medicine

## 2014-04-24 ENCOUNTER — Encounter: Payer: Self-pay | Admitting: Physician Assistant

## 2014-04-24 ENCOUNTER — Ambulatory Visit (INDEPENDENT_AMBULATORY_CARE_PROVIDER_SITE_OTHER): Payer: BLUE CROSS/BLUE SHIELD | Admitting: Internal Medicine

## 2014-04-24 ENCOUNTER — Encounter: Payer: Self-pay | Admitting: Internal Medicine

## 2014-04-24 VITALS — BP 122/78 | HR 82 | Temp 98.2°F | Resp 20 | Ht 74.0 in | Wt 322.0 lb

## 2014-04-24 DIAGNOSIS — R0600 Dyspnea, unspecified: Secondary | ICD-10-CM

## 2014-04-24 DIAGNOSIS — Z0001 Encounter for general adult medical examination with abnormal findings: Secondary | ICD-10-CM

## 2014-04-24 DIAGNOSIS — Z79899 Other long term (current) drug therapy: Secondary | ICD-10-CM

## 2014-04-24 DIAGNOSIS — M5442 Lumbago with sciatica, left side: Secondary | ICD-10-CM

## 2014-04-24 DIAGNOSIS — Z9989 Dependence on other enabling machines and devices: Secondary | ICD-10-CM

## 2014-04-24 DIAGNOSIS — Z1212 Encounter for screening for malignant neoplasm of rectum: Secondary | ICD-10-CM

## 2014-04-24 DIAGNOSIS — E785 Hyperlipidemia, unspecified: Secondary | ICD-10-CM

## 2014-04-24 DIAGNOSIS — E291 Testicular hypofunction: Secondary | ICD-10-CM

## 2014-04-24 DIAGNOSIS — R5383 Other fatigue: Secondary | ICD-10-CM

## 2014-04-24 DIAGNOSIS — E1122 Type 2 diabetes mellitus with diabetic chronic kidney disease: Secondary | ICD-10-CM

## 2014-04-24 DIAGNOSIS — I10 Essential (primary) hypertension: Secondary | ICD-10-CM

## 2014-04-24 DIAGNOSIS — M109 Gout, unspecified: Secondary | ICD-10-CM

## 2014-04-24 DIAGNOSIS — E559 Vitamin D deficiency, unspecified: Secondary | ICD-10-CM

## 2014-04-24 DIAGNOSIS — E349 Endocrine disorder, unspecified: Secondary | ICD-10-CM

## 2014-04-24 DIAGNOSIS — M5441 Lumbago with sciatica, right side: Secondary | ICD-10-CM

## 2014-04-24 DIAGNOSIS — Z125 Encounter for screening for malignant neoplasm of prostate: Secondary | ICD-10-CM

## 2014-04-24 DIAGNOSIS — G4733 Obstructive sleep apnea (adult) (pediatric): Secondary | ICD-10-CM

## 2014-04-24 DIAGNOSIS — N182 Chronic kidney disease, stage 2 (mild): Secondary | ICD-10-CM

## 2014-04-24 DIAGNOSIS — R6889 Other general symptoms and signs: Secondary | ICD-10-CM

## 2014-04-24 LAB — CBC WITH DIFFERENTIAL/PLATELET
Basophils Absolute: 0.1 10*3/uL (ref 0.0–0.1)
Basophils Relative: 1 % (ref 0–1)
Eosinophils Absolute: 0.2 10*3/uL (ref 0.0–0.7)
Eosinophils Relative: 2 % (ref 0–5)
HCT: 46.5 % (ref 39.0–52.0)
Hemoglobin: 16.1 g/dL (ref 13.0–17.0)
Lymphocytes Relative: 21 % (ref 12–46)
Lymphs Abs: 1.8 10*3/uL (ref 0.7–4.0)
MCH: 31.1 pg (ref 26.0–34.0)
MCHC: 34.6 g/dL (ref 30.0–36.0)
MCV: 89.8 fL (ref 78.0–100.0)
MPV: 9.6 fL (ref 8.6–12.4)
Monocytes Absolute: 0.6 10*3/uL (ref 0.1–1.0)
Monocytes Relative: 7 % (ref 3–12)
Neutro Abs: 5.8 10*3/uL (ref 1.7–7.7)
Neutrophils Relative %: 69 % (ref 43–77)
Platelets: 276 10*3/uL (ref 150–400)
RBC: 5.18 MIL/uL (ref 4.22–5.81)
RDW: 15.5 % (ref 11.5–15.5)
WBC: 8.4 10*3/uL (ref 4.0–10.5)

## 2014-04-24 NOTE — Patient Instructions (Signed)

## 2014-04-24 NOTE — Progress Notes (Signed)
Patient ID: Jason Wyatt, male   DOB: [birth year], 59 y.o.   MRN: [id number]  Complete Physical  Assessment and Plan:   1. Essential hypertension -cont meds -diet and exercise -try water aerobics - Urine Microscopic - Microalbumin / creatinine urine ratio - EKG 12-Lead - [place], RETROPERITNL ABD,  LTD - Urine culture  2. OSA on CPAP -cont cpap use -saline in nose prn for moisture  3. Type 2 diabetes mellitus with diabetic chronic kidney disease -diet and exercise -cont meds - Hemoglobin A1c - Insulin, fasting  4. CKD (chronic kidney disease), stage II -Bmet to monitor kidney function -oral hydration  5. Gout without tophus, unspecified cause, unspecified chronicity, unspecified site  - Uric acid  6. Dyspnea -cpap use -lose weight  7. Morbid obesity -diet and exercise  8. Hyperlipidemia -diet and exercise - Lipid panel - TSH  9. Other fatigue  - Vitamin B12 - Iron and TIBC  10. Bilateral low back pain with sciatica, sciatica laterality unspecified -followed by Dr. Patrice Paradise  11. Screening for rectal cancer  - POC Hemoccult Bld/Stl (3-Cd Home Screen); Future  12. Screening for prostate cancer  - PSA  13. Testosterone deficiency  - Testosterone  14. Medication management  - CBC with Differential/Platelet - BASIC METABOLIC PANEL WITH GFR - Hepatic function panel - Magnesium  15. Vitamin D deficiency -cont supplement - Vit D  25 hydroxy (rtn osteoporosis monitoring)    Discussed med's effects and SE's. Screening labs and tests as requested with regular follow-up as recommended.  HPI Patient presents for a complete physical.   His blood pressure has been controlled at home, today their BP is BP: 122/78 mmHg He does not workout. He denies chest pain, shortness of breath, dizziness.  He is trying to walk up to 2 miles a day.     He is not on cholesterol medication and denies myalgias. His cholesterol is at goal. The cholesterol last visit was:    Lab Results  Component Value Date   CHOL 166 02/03/2014   HDL 30* 02/03/2014   LDLCALC NOT CALC 02/03/2014   TRIG 480* 02/03/2014   CHOLHDL 5.5 02/03/2014    He has not been working on diet and exercise for diabetes, he is on bASA, he is on ACE/ARB and denies foot ulcerations, hyperglycemia, hypoglycemia , increased appetite, nausea, paresthesia of the feet, polydipsia, polyuria, visual disturbances, vomiting and weight loss. Last A1C in the office was:  Lab Results  Component Value Date   HGBA1C 7.8* 02/03/2014  His sugars are running 100-140.  He reports that he checks it first thing in the morning.    Patient is on Vitamin D supplement.   Lab Results  Component Value Date   VD25OH 57 02/03/2014      Last PSA was: Lab Results  Component Value Date   PSA <0.01 04/07/2013  .  Denies BPH symptoms daytime frequency, double voiding, dysuria, hematuria, hesitancy, incontinence, intermittency, nocturia, sensation of incomplete bladder emptying, suprapubic pain, urgency or weak urinary stream.  Patient is hoping to have back surgery in May 2nd.   He does have a history of prostate surgery and removal 2 years prior.    Patient do Current Medications:  Current Outpatient Prescriptions on File Prior to Visit  Medication Sig Dispense Refill  . acyclovir (ZOVIRAX) 400 MG tablet Take 400 mg by mouth daily as needed (for fever blisters).    [name] allopurinol (ZYLOPRIM) 300 MG tablet Take 300 mg by mouth daily.    [name]  aspirin EC 81 MG tablet Take 81 mg by mouth every morning.    . canagliflozin (INVOKANA) 300 MG TABS tablet Take 300 mg by mouth daily before breakfast. 90 tablet 3  . carbidopa-levodopa (SINEMET IR) 25-250 MG per tablet Take 1 tablet by mouth twice daily for restless legs. 180 tablet 4  . colchicine 0.6 MG tablet Take 0.6 mg by mouth 2 (two) times daily as needed (gout flare ups).     . dapagliflozin propanediol (FARXIGA) 10 MG TABS tablet Take 10 mg by mouth daily. 90 tablet 4   . DULoxetine (CYMBALTA) 60 MG capsule Take 60 mg by mouth daily.    . enalapril (VASOTEC) 20 MG tablet Take 20 mg by mouth 2 (two) times daily.    [name] gabapentin (NEURONTIN) 300 MG capsule TAKE 1 BY MOUTH 3 TIMES DAILY 270 capsule 4  . lansoprazole (PREVACID) 30 MG capsule TAKE 1 BY MOUTH DAILY FOR HEARTBURN 90 capsule 0  . Magnesium 500 MG CAPS Take 500 mg by mouth daily.    . metFORMIN (GLUCOPHAGE) 1000 MG tablet Take 1 tablet (1,000 mg total) by mouth 2 (two) times daily with a meal. 180 tablet 1  . mometasone (NASONEX) 50 MCG/ACT nasal spray Place 2 sprays into the nose daily as needed (allergies).    . Multiple Vitamin (MULTIVITAMIN WITH MINERALS) TABS Take 1 tablet by mouth daily.    [name] NASONEX 50 MCG/ACT nasal spray USE 2 SPRAYS IN EACH NOSTRIL DAILY 17 g 3  . omega-3 acid ethyl esters (LOVAZA) 1 G capsule Take 2 capsules (2 g total) by mouth 2 (two) times daily. 360 capsule 1  . saxagliptin HCl (ONGLYZA) 5 MG TABS tablet Take 1 tablet (5 mg total) by mouth daily. 90 tablet 3  . sitaGLIPtin (JANUVIA) 100 MG tablet Take 100 mg by mouth daily.    . Vitamin D, Ergocalciferol, (DRISDOL) 50000 UNITS CAPS capsule Take 50,000 Units by mouth every other day.     No current facility-administered medications on file prior to visit.    Health Maintenance:  Immunization History  Administered Date(s) Administered  . Influenza Split 10/17/2013  . Influenza-Unspecified 11/04/2012  . PPD Test 01/16/2006, 04/07/2013  . Pneumococcal Polysaccharide-23 04/07/2013  . Pneumococcal-Unspecified 09/17/2010  . Td 01/17/2011  . Zoster 01/16/2005   Patient Care Team: Unk Pinto, MD as PCP - General (Internal Medicine) [place], DPM as Consulting Physician (Podiatry) Jonetta Speak. Nicki Reaper, MD (Dentistry) Webb Laws, OD as Referring Physician (Optometry) Peter M Martinique, MD as Consulting Physician (Cardiology) Ronald Lobo, MD as Consulting Physician (Gastroenterology) Susa Day, MD as  Consulting Physician (Orthopedic Surgery) Rolan Bucco, MD as Consulting Physician (Urology)  Allergies:  Allergies  Allergen Reactions  . Flaxseed [Linseed Oil] Rash  . Lyrica [Pregabalin] Itching and Rash  . Niaspan [Niacin Er] Rash    Medical History:  Past Medical History  Diagnosis Date  . Gout     takes Allopurinol daily and Colchicine if needed  . Complication of anesthesia 06/2010    "woke up next day w/ventilator on"  . High cholesterol     takes Fish Oil daily  . Exertional dyspnea 08/03/11  . Arthritis     "in my back"  . Prostate cancer   . Urinary frequency     takes Flomax daily  . Type II diabetes mellitus     takes Farxiga,Januvia,and Metformin daily  . GERD (gastroesophageal reflux disease)     takes Prevacid daily  . Hypertension  takes Enalapril daily  . Peripheral neuropathy     takes Sinemet daily  . Thyroid nodule     left side  . OSA (obstructive sleep apnea)     sleep study done about 75yrs ago;uses CPAP  . Weakness     numbness and tingling  . Joint pain   . Chronic back pain     stenosis  . History of colon polyps   . History of blood transfusion     Surgical History:  Past Surgical History  Procedure Laterality Date  . Posterior fusion cervical spine  06/2010    "put rods and screws in"  . Uvulopalatopharyngoplasty, tonsillectomy and septoplasty  1990's  . Tonsillectomy and adenoidectomy  1990's    "for sleep apnea"  . Esophagogastroduodenoscopy  09/26/2011    Procedure: ESOPHAGOGASTRODUODENOSCOPY (EGD);  Surgeon: Cleotis Nipper, MD;  Location: [facility];  Service: Endoscopy;  Laterality: N/A;  . Robot assisted laparoscopic radical prostatectomy  02/07/2012    Procedure: ROBOTIC ASSISTED LAPAROSCOPIC RADICAL PROSTATECTOMY LEVEL 1;  Surgeon: Molli Hazard, MD;  Location: [facility];  Service: Urology;  Laterality: N/A;     . Wisdom teeth extracted   1982  . Colonoscopy    . Left and right heart catheterization with  coronary angiogram N/A 08/25/2011    Procedure: LEFT AND RIGHT HEART CATHETERIZATION WITH CORONARY ANGIOGRAM;  Surgeon: Peter M Martinique, MD;  Location: [facility] CATH LAB;  Service: Cardiovascular;  Laterality: N/A;    Family History:  Family History  Problem Relation Age of Onset  . Hypertension Mother   . Dementia Mother   . Osteoporosis Mother   . Osteoarthritis Mother   . Cancer Father   . Hyperlipidemia Sister   . Hypertension Sister     Social History:   History  Substance Use Topics  . Smoking status: Never Smoker   . Smokeless tobacco: Never Used  . Alcohol Use: No    Review of Systems:  Review of Systems  Constitutional: Positive for malaise/fatigue. Negative for fever and chills.  HENT: Positive for tinnitus (resolved now). Negative for congestion, ear pain, nosebleeds and sore throat.   Eyes: Negative.   Respiratory: Negative for cough, shortness of breath, wheezing and stridor.   Cardiovascular: Negative for chest pain, palpitations and leg swelling.  Gastrointestinal: Negative for heartburn, nausea, vomiting, abdominal pain, diarrhea, constipation, blood in stool and melena.  Genitourinary: Negative for dysuria, urgency, frequency and hematuria.  Musculoskeletal: Positive for back pain.  Skin: Negative.   Neurological: Positive for sensory change (chronic back pain). Negative for dizziness, loss of consciousness, weakness and headaches.    Physical Exam: Estimated body mass index is 41.32 kg/(m^2) as calculated from the following:   Height as of this encounter: 6\' 2"  (1.88 m).   Weight as of this encounter: 322 lb (146.058 kg). BP 122/78 mmHg  Pulse 82  Temp(Src) 98.2 F (36.8 C) (Temporal)  Resp 20  Ht 6\' 2"  (1.88 m)  Wt 322 lb (146.058 kg)  BMI 41.32 kg/m2  General Appearance: Well nourished, in no apparent distress.  Eyes: PERRLA, EOMs, conjunctiva no swelling or erythema ENT/Mouth: Ear canals clear bilaterally with no erythema, swelling, discharge.  TMs  normal bilaterally with no erythema, bulging, or retractions.  Oropharynx clear and moist with no exudate, swelling, or erythema.  Dentition normal.   Neck: Supple, thyroid normal. No bruits, JVD, cervical adenopathy Respiratory: Respiratory effort normal, BS equal bilaterally without rales, rhonchi, wheezing or stridor. Breath sounds diminished secondary to body  habitus Cardio: RRR S1S2 distant likely secondary to body habitus.  No appreciable murmurs rubs or gallops. Brisk peripheral pulses without edema.  Chest: symmetric, with normal excursions Abdomen: Obese, Soft, nontender, no guarding, rebound, hernias, masses, or organomegaly. Genitourinary: Patient declined examination as he no longer has a prostate. Musculoskeletal: Full ROM all peripheral extremities,5/5 strength, and normal gait.  Skin: Warm, dry without rashes, lesions, ecchymosis. Neuro: A&Ox3, Cranial nerves intact, reflexes equal bilaterally. Normal muscle tone, no cerebellar symptoms. Sensation intact.  Psych: Normal affect, Insight and Judgment appropriate.   EKG: WNL no changes.  AORTA SCAN: One shot was normal, one shot was elevated.  Unsure if this is due to body habitus and is a bowel loop or is actually aorta.  Will follow with scans in office.  Patient to return for severe back or abdominal pain.    Over 40 minutes of exam, counseling, chart review and critical decision making was performed  FORCUCCI, Odis Turck 10:15 AM [facility]

## 2014-04-25 LAB — HEPATIC FUNCTION PANEL
ALT: 25 U/L (ref 0–53)
AST: 49 U/L — ABNORMAL HIGH (ref 0–37)
Albumin: 4.5 g/dL (ref 3.5–5.2)
Alkaline Phosphatase: 73 U/L (ref 39–117)
Bilirubin, Direct: 0.1 mg/dL (ref 0.0–0.3)
Indirect Bilirubin: 0.4 mg/dL (ref 0.2–1.2)
Total Bilirubin: 0.5 mg/dL (ref 0.2–1.2)
Total Protein: 7.3 g/dL (ref 6.0–8.3)

## 2014-04-25 LAB — BASIC METABOLIC PANEL WITH GFR
BUN: 13 mg/dL (ref 6–23)
CO2: 26 mEq/L (ref 19–32)
Calcium: 9.9 mg/dL (ref 8.4–10.5)
Chloride: 102 mEq/L (ref 96–112)
Creat: 1.03 mg/dL (ref 0.50–1.35)
GFR, Est African American: >89 mL/min
GFR, Est Non African American: 78 mL/min
Glucose, Bld: 117 mg/dL — ABNORMAL HIGH (ref 70–99)
Potassium: 4.7 mEq/L (ref 3.5–5.3)
Sodium: 138 mEq/L (ref 135–145)

## 2014-04-25 LAB — LIPID PANEL
Cholesterol: 163 mg/dL (ref 0–200)
HDL: 25 mg/dL — ABNORMAL LOW (ref >=40)
Total CHOL/HDL Ratio: 6.5 Ratio
Triglycerides: 409 mg/dL — ABNORMAL HIGH (ref <150)

## 2014-04-25 LAB — VITAMIN B12: Vitamin B-12: 461 pg/mL (ref 211–911)

## 2014-04-25 LAB — MICROALBUMIN / CREATININE URINE RATIO
Creatinine, Urine: 94.2 mg/dL
Microalb Creat Ratio: 10.6 mg/g (ref 0.0–30.0)
Microalb, Ur: 1 mg/dL (ref <2.0)

## 2014-04-25 LAB — IRON AND TIBC
%SAT: 18 % — ABNORMAL LOW (ref 20–55)
Iron: 69 ug/dL (ref 42–165)
TIBC: 389 ug/dL (ref 215–435)
UIBC: 320 ug/dL (ref 125–400)

## 2014-04-25 LAB — URINALYSIS, MICROSCOPIC ONLY
Bacteria, UA: NONE SEEN
Casts: NONE SEEN
Crystals: NONE SEEN
Squamous Epithelial / LPF: NONE SEEN

## 2014-04-25 LAB — INSULIN, FASTING: Insulin fasting, serum: 15 u[IU]/mL (ref 2.0–19.6)

## 2014-04-25 LAB — HEMOGLOBIN A1C
Hgb A1c MFr Bld: 7.7 % — ABNORMAL HIGH (ref <5.7)
Mean Plasma Glucose: 174 mg/dL — ABNORMAL HIGH (ref <117)

## 2014-04-25 LAB — URIC ACID: Uric Acid, Serum: 3.9 mg/dL — ABNORMAL LOW (ref 4.0–7.8)

## 2014-04-25 LAB — PSA: PSA: <0.01 ng/mL (ref <=4.00)

## 2014-04-25 LAB — VITAMIN D 25 HYDROXY (VIT D DEFICIENCY, FRACTURES): Vit D, 25-Hydroxy: 60 ng/mL (ref 30–100)

## 2014-04-25 LAB — TESTOSTERONE: Testosterone: 151 ng/dL — ABNORMAL LOW (ref 300–890)

## 2014-04-25 LAB — MAGNESIUM: Magnesium: 2 mg/dL (ref 1.5–2.5)

## 2014-04-25 LAB — TSH: TSH: 2.707 u[IU]/mL (ref 0.350–4.500)

## 2014-04-26 LAB — URINE CULTURE
Colony Count: NO GROWTH
Organism ID, Bacteria: NO GROWTH

## 2014-04-29 ENCOUNTER — Other Ambulatory Visit: Payer: BLUE CROSS/BLUE SHIELD

## 2014-04-29 DIAGNOSIS — Z1212 Encounter for screening for malignant neoplasm of rectum: Secondary | ICD-10-CM

## 2014-04-29 LAB — POC HEMOCCULT BLD/STL (HOME/3-CARD/SCREEN)
Card #2 Fecal Occult Blod, POC: NEGATIVE
Card #3 Fecal Occult Blood, POC: NEGATIVE
Fecal Occult Blood, POC: NEGATIVE

## 2014-05-18 HISTORY — PX: POSTERIOR LUMBAR FUSION: SHX6036

## 2014-06-01 ENCOUNTER — Other Ambulatory Visit: Payer: Self-pay | Admitting: Internal Medicine

## 2014-06-03 ENCOUNTER — Other Ambulatory Visit: Payer: Self-pay | Admitting: Internal Medicine

## 2014-06-05 ENCOUNTER — Other Ambulatory Visit: Payer: Self-pay | Admitting: *Deleted

## 2014-06-05 ENCOUNTER — Other Ambulatory Visit: Payer: Self-pay | Admitting: Internal Medicine

## 2014-06-05 MED ORDER — LANSOPRAZOLE 30 MG PO CPDR: DELAYED_RELEASE_CAPSULE | ORAL | Status: DC

## 2014-06-16 ENCOUNTER — Ambulatory Visit (INDEPENDENT_AMBULATORY_CARE_PROVIDER_SITE_OTHER): Payer: BLUE CROSS/BLUE SHIELD | Admitting: Podiatry

## 2014-06-16 ENCOUNTER — Encounter: Payer: Self-pay | Admitting: Podiatry

## 2014-06-16 DIAGNOSIS — M79673 Pain in unspecified foot: Secondary | ICD-10-CM | POA: Diagnosis not present

## 2014-06-16 DIAGNOSIS — B351 Tinea unguium: Secondary | ICD-10-CM

## 2014-06-16 NOTE — Progress Notes (Signed)
He presents today with a chief complaint of painful elongated toenails.  Objective: Nails are thick yellow dystrophic onychomycotic and painful palpation.  Assessment: Pain in limb secondary to onychomycosis 1 through 5 bilateral.  Plan: Debridement of nails 1 through 5 bilateral.

## 2014-07-21 ENCOUNTER — Other Ambulatory Visit: Payer: Self-pay | Admitting: *Deleted

## 2014-07-21 MED ORDER — SITAGLIPTIN PHOSPHATE 100 MG PO TABS: 100.0000 mg | ORAL_TABLET | Freq: Every day | ORAL | Status: DC

## 2014-07-26 ENCOUNTER — Encounter: Payer: Self-pay | Admitting: Internal Medicine

## 2014-07-26 DIAGNOSIS — Z79899 Other long term (current) drug therapy: Secondary | ICD-10-CM | POA: Insufficient documentation

## 2014-07-26 DIAGNOSIS — G2581 Restless legs syndrome: Secondary | ICD-10-CM | POA: Insufficient documentation

## 2014-07-26 NOTE — Patient Instructions (Signed)

## 2014-07-26 NOTE — Progress Notes (Signed)
Patient ID: Jason Wyatt, male   DOB: Aug 22, 1955, 59 y.o.   MRN: [id number]   This very nice 59 y.o.male presents for 3 month follow up with Hypertension, Hyperlipidemia, T2_NIDDM w/CKD2 and Vitamin D Deficiency. Other problems include OSA compensated with CPAP & reports improved sleep hygiene and no mid day fatigue.   Patient is treated for HTN since 1973 & BP has been controlled at home. Today's BP: (!) 152/88 mmHg. Patient has had no complaints of any cardiac type chest pain, palpitations, dyspnea/orthopnea/PND, dizziness, claudication, or dependent edema.   Hyperlipidemia is controlled with diet & meds. Patient denies myalgias or other med SE's. Last Lipids were at goal  Chol 163; HDL 25*; LDL(not  Calc); and with elevated Trig 409 on 04/24/2014.   Also, the patient has history of T2_NIDDM predating since 2004 w/CKD2 (GFR 78 ml/min) and has had no symptoms of reactive hypoglycemia, diabetic polys, paresthesias or visual blurring. Reports FBG's run less than 120 mg%.  Last A1c was 7.7% on 04/24/2014. Also, has hx/o gout controlled on Allopurinol/Colchicine.    Further, the patient also has history of Vitamin D Deficiency and supplements vitamin D without any suspected side-effects. Last vitamin D was  61 on 4/8/201.     Medication Sig  . acyclovir  400 MG tablet TAKE 1 BY MOUTH DAILY AS NEEDED FOR COLD SORES  . allopurinol  300 MG tablet TAKE 1 BY MOUTH AT BEDTIME  . aspirin EC 81 MG tablet Take 81 mg by mouth every morning.  . canagliflozin (INVOKANA) 300 MG TABS tablet Take 300 mg by mouth daily before breakfast.  . SINEMET IR 25-250 MG per tablet Take 1 tablet by mouth twice daily for restless legs.  . colchicine 0.6 MG tablet Take 0.6 mg by mouth 2 (two) times daily as needed (gout flare ups).   . dapagliflozin (FARXIGA) 10 MG TABS tablet Take 10 mg by mouth daily.  . DULoxetine  60 MG capsule TAKE 1 BY MOUTH AT BEDTIME  . enalapril  20 MG tablet TAKE 1 BY MOUTH TWICE DAILY  . gabapentin  300  MG capsule TAKE 1 BY MOUTH 3 TIMES DAILY  . lansoprazole  30 MG capsule TAKE 1 BY MOUTH DAILY FOR HEARTBURN  . Magnesium 500 MG CAPS Take 500 mg by mouth daily.  . metFORMIN  1000 MG tablet Take 1 tablet (1,000 mg total) by mouth 2 (two) times daily with a meal.  . mometasone (NASONEX) 50 MCG/ACT nasal spray Place 2 sprays into the nose daily as needed (allergies).  . MULTIVITAMIN WITH MINERALS Take 1 tablet by mouth daily.  [name] NASONEX 50 MCG/ACT nasal spray USE 2 SPRAYS IN EACH NOSTRIL DAILY  . omega-3 (LOVAZA) 1 G capsule TAKE 2 BY MOUTH TWICE DAILY  . saxagliptin HCl (ONGLYZA) 5 MG TABS tablet Take 1 tablet (5 mg total) by mouth daily.  . sitaGLIPtin (JANUVIA) 100 MG tablet Take 1 tablet (100 mg total) by mouth daily.  . Vitamin D 50,000 UNITS CAPS  Take 50,000 Units by mouth every other day.   Allergies  Allergen Reactions  . Flaxseed [Linseed Oil] Rash  . Lyrica [Pregabalin] Itching and Rash  . Niaspan [Niacin Er] Rash   PMHx:   Past Medical History  Diagnosis Date  . Gout     takes Allopurinol daily and Colchicine if needed  . Complication of anesthesia 06/2010    "woke up next day w/ventilator on"  . High cholesterol     takes Fish Oil  daily  . Exertional dyspnea 08/03/11  . Arthritis     "in my back"  . Prostate cancer   . Urinary frequency     takes Flomax daily  . Type II diabetes mellitus     takes Farxiga,Januvia,and Metformin daily  . GERD (gastroesophageal reflux disease)     takes Prevacid daily  . Hypertension     takes Enalapril daily  . Peripheral neuropathy     takes Sinemet daily  . Thyroid nodule     left side  . OSA (obstructive sleep apnea)     sleep study done about 61yrs ago;uses CPAP  . Weakness     numbness and tingling  . Joint pain   . Chronic back pain     stenosis  . History of colon polyps   . History of blood transfusion    Immunization History  Administered Date(s) Administered  . Influenza Split 10/17/2013  .  Influenza-Unspecified 11/04/2012  . PPD Test 01/16/2006, 04/07/2013  . Pneumococcal Polysaccharide-23 04/07/2013  . Pneumococcal-Unspecified 09/17/2010  . Td 01/17/2011  . Zoster 01/16/2005   Past Surgical History  Procedure Laterality Date  . Posterior fusion cervical spine  06/2010    "put rods and screws in"  . Uvulopalatopharyngoplasty, tonsillectomy and septoplasty  1990's  . Tonsillectomy and adenoidectomy  1990's    "for sleep apnea"  . Esophagogastroduodenoscopy  09/26/2011    Procedure: ESOPHAGOGASTRODUODENOSCOPY (EGD);  Surgeon: Cleotis Nipper, MD;  Location: [facility];  Service: Endoscopy;  Laterality: N/A;  . Robot assisted laparoscopic radical prostatectomy  02/07/2012    Procedure: ROBOTIC ASSISTED LAPAROSCOPIC RADICAL PROSTATECTOMY LEVEL 1;  Surgeon: Molli Hazard, MD;  Location: [facility];  Service: Urology;  Laterality: N/A;     . Wisdom teeth extracted   1982  . Colonoscopy    . Left and right heart catheterization with coronary angiogram N/A 08/25/2011    Procedure: LEFT AND RIGHT HEART CATHETERIZATION WITH CORONARY ANGIOGRAM;  Surgeon: Peter M Martinique, MD;  Location: [facility] CATH LAB;  Service: Cardiovascular;  Laterality: N/A;   FHx:    Reviewed / unchanged  SHx:    Reviewed / unchanged  Systems Review:  Constitutional: Denies fever, chills, wt changes, headaches, insomnia, fatigue, night sweats, change in appetite. Eyes: Denies redness, blurred vision, diplopia, discharge, itchy, watery eyes.  ENT: Denies discharge, congestion, post nasal drip, epistaxis, sore throat, earache, hearing loss, dental pain, tinnitus, vertigo, sinus pain, snoring.  CV: Denies chest pain, palpitations, irregular heartbeat, syncope, dyspnea, diaphoresis, orthopnea, PND, claudication or edema. Respiratory: denies cough, dyspnea, DOE, pleurisy, hoarseness, laryngitis, wheezing.  Gastrointestinal: Denies dysphagia, odynophagia, heartburn, reflux, water brash, abdominal pain or cramps,  nausea, vomiting, bloating, diarrhea, constipation, hematemesis, melena, hematochezia  or hemorrhoids. Genitourinary: Denies dysuria, frequency, urgency, nocturia, hesitancy, discharge, hematuria or flank pain. Musculoskeletal: Denies arthralgias, myalgias, stiffness, jt. swelling, pain, limping or strain/sprain.  Skin: Denies pruritus, rash, hives, warts, acne, eczema or change in skin lesion(s). Neuro: No weakness, tremor, incoordination, spasms, paresthesia or pain. Psychiatric: Denies confusion, memory loss or sensory loss. Endo: Denies change in weight, skin or hair change.  Heme/Lymph: No excessive bleeding, bruising or enlarged lymph nodes.  Physical Exam  BP 152/88   Pulse 96  Temp 97.1 F   Resp 16  Ht 6\' 2"    Wt 322 lb 9.6 oz     BMI 41.40   Appears over nourished with truncal obesity and in no distress. Eyes: PERRLA, EOMs, conjunctiva no swelling or erythema. Sinuses: No frontal/maxillary  tenderness ENT/Mouth: EAC's clear, TM's nl w/o erythema, bulging. Nares clear w/o erythema, swelling, exudates. Oropharynx clear without erythema or exudates. Oral hygiene is good. Tongue normal, non obstructing. Hearing intact.  Neck: Supple. Thyroid nl. Car 2+/2+ without bruits, nodes or JVD. Chest: Respirations nl with BS clear & equal w/o rales, rhonchi, wheezing or stridor.  Cor: Heart sounds normal w/ regular rate and rhythm without sig. murmurs, gallops, clicks, or rubs. Peripheral pulses normal and equal  without edema.  Abdomen: Soft & bowel sounds normal. Non-tender w/o guarding, rebound, hernias, masses, or organomegaly.  Lymphatics: Unremarkable.  Musculoskeletal: Full ROM all peripheral extremities, joint stability, 5/5 strength, and normal gait.  Skin: Warm, dry without exposed rashes, lesions or ecchymosis apparent.  Neuro: Cranial nerves intact, reflexes equal bilaterally. Sensory-motor testing grossly intact. Tendon reflexes grossly intact.  Pysch: Alert & oriented x 3.   Insight and judgement nl & appropriate. No ideations.  Assessment and Plan:  1. Essential hypertension  - TSH  2. Hyperlipidemia  - Lipid panel  3. Type 2 diabetes mellitus with other diabetic kidney complication  - Hemoglobin A1c - Insulin, random  4. Idiopathic gout  - Uric acid - Vit D  25 hydroxy   5. Medication management  - CBC with Differential/Platelet - BASIC METABOLIC PANEL WITH GFR - Hepatic function panel - Magnesium  6. Severe obesity (BMI >= 40)   7. OSA / CPAP   Recommended regular exercise, BP monitoring, weight control, and discussed med and SE's. recc he read Dr Fara Olden Fuhrman's book: "The End of Diabetes".  Recommended labs to assess and monitor clinical status. Further disposition pending results of labs. Over 30 minutes of exam, counseling, chart review was performed

## 2014-07-27 ENCOUNTER — Encounter: Payer: Self-pay | Admitting: Internal Medicine

## 2014-07-27 ENCOUNTER — Other Ambulatory Visit: Payer: Self-pay | Admitting: Internal Medicine

## 2014-07-27 ENCOUNTER — Ambulatory Visit (INDEPENDENT_AMBULATORY_CARE_PROVIDER_SITE_OTHER): Payer: BLUE CROSS/BLUE SHIELD | Admitting: Internal Medicine

## 2014-07-27 VITALS — BP 152/88 | HR 96 | Temp 97.1°F | Resp 16 | Ht 74.0 in | Wt 322.6 lb

## 2014-07-27 DIAGNOSIS — I10 Essential (primary) hypertension: Secondary | ICD-10-CM

## 2014-07-27 DIAGNOSIS — Z79899 Other long term (current) drug therapy: Secondary | ICD-10-CM

## 2014-07-27 DIAGNOSIS — E785 Hyperlipidemia, unspecified: Secondary | ICD-10-CM

## 2014-07-27 DIAGNOSIS — E559 Vitamin D deficiency, unspecified: Secondary | ICD-10-CM

## 2014-07-27 DIAGNOSIS — M1000 Idiopathic gout, unspecified site: Secondary | ICD-10-CM

## 2014-07-27 DIAGNOSIS — E1129 Type 2 diabetes mellitus with other diabetic kidney complication: Secondary | ICD-10-CM

## 2014-07-27 LAB — HEPATIC FUNCTION PANEL
ALT: 33 U/L (ref 0–53)
AST: 26 U/L (ref 0–37)
Albumin: 4.2 g/dL (ref 3.5–5.2)
Alkaline Phosphatase: 80 U/L (ref 39–117)
Bilirubin, Direct: 0.1 mg/dL (ref 0.0–0.3)
Indirect Bilirubin: 0.4 mg/dL (ref 0.2–1.2)
Total Bilirubin: 0.5 mg/dL (ref 0.2–1.2)
Total Protein: 6.6 g/dL (ref 6.0–8.3)

## 2014-07-27 LAB — HEMOGLOBIN A1C
Hgb A1c MFr Bld: 7.6 % — ABNORMAL HIGH (ref <5.7)
Mean Plasma Glucose: 171 mg/dL — ABNORMAL HIGH (ref <117)

## 2014-07-27 LAB — BASIC METABOLIC PANEL WITH GFR
BUN: 19 mg/dL (ref 6–23)
CO2: 22 mEq/L (ref 19–32)
Calcium: 9.6 mg/dL (ref 8.4–10.5)
Chloride: 105 mEq/L (ref 96–112)
Creat: 0.93 mg/dL (ref 0.50–1.35)
GFR, Est African American: >89 mL/min
GFR, Est Non African American: 88 mL/min
Glucose, Bld: 155 mg/dL — ABNORMAL HIGH (ref 70–99)
Potassium: 4.5 mEq/L (ref 3.5–5.3)
Sodium: 140 mEq/L (ref 135–145)

## 2014-07-27 LAB — TSH: TSH: 2.984 u[IU]/mL (ref 0.350–4.500)

## 2014-07-27 LAB — LIPID PANEL
Cholesterol: 153 mg/dL (ref 0–200)
HDL: 22 mg/dL — ABNORMAL LOW (ref >=40)
LDL Cholesterol: 64 mg/dL (ref 0–99)
Total CHOL/HDL Ratio: 7 Ratio
Triglycerides: 333 mg/dL — ABNORMAL HIGH (ref <150)
VLDL: 67 mg/dL — ABNORMAL HIGH (ref 0–40)

## 2014-07-27 LAB — URIC ACID: Uric Acid, Serum: 4.2 mg/dL (ref 4.0–7.8)

## 2014-07-27 LAB — MAGNESIUM: Magnesium: 1.9 mg/dL (ref 1.5–2.5)

## 2014-07-28 LAB — CBC WITH DIFFERENTIAL/PLATELET
Basophils Absolute: 0.1 10*3/uL (ref 0.0–0.1)
Basophils Relative: 1 % (ref 0–1)
Eosinophils Absolute: 0.2 10*3/uL (ref 0.0–0.7)
Eosinophils Relative: 3 % (ref 0–5)
HCT: 40.8 % (ref 39.0–52.0)
Hemoglobin: 13.3 g/dL (ref 13.0–17.0)
Lymphocytes Relative: 22 % (ref 12–46)
Lymphs Abs: 1.5 10*3/uL (ref 0.7–4.0)
MCH: 28.6 pg (ref 26.0–34.0)
MCHC: 32.6 g/dL (ref 30.0–36.0)
MCV: 87.7 fL (ref 78.0–100.0)
MPV: 9.6 fL (ref 8.6–12.4)
Monocytes Absolute: 0.6 10*3/uL (ref 0.1–1.0)
Monocytes Relative: 9 % (ref 3–12)
Neutro Abs: 4.3 10*3/uL (ref 1.7–7.7)
Neutrophils Relative %: 65 % (ref 43–77)
Platelets: 277 10*3/uL (ref 150–400)
RBC: 4.65 MIL/uL (ref 4.22–5.81)
RDW: 16.1 % — ABNORMAL HIGH (ref 11.5–15.5)
WBC: 6.6 10*3/uL (ref 4.0–10.5)

## 2014-07-28 LAB — INSULIN, RANDOM: Insulin: 48.4 u[IU]/mL — ABNORMAL HIGH (ref 2.0–19.6)

## 2014-07-28 LAB — VITAMIN D 25 HYDROXY (VIT D DEFICIENCY, FRACTURES): Vit D, 25-Hydroxy: 60 ng/mL (ref 30–100)

## 2014-08-17 ENCOUNTER — Other Ambulatory Visit: Payer: Self-pay | Admitting: *Deleted

## 2014-08-17 MED ORDER — GLUCOSE BLOOD VI STRP
ORAL_STRIP | Status: DC
Start: 2014-08-17 — End: 2014-12-28

## 2014-08-17 MED ORDER — ONETOUCH ULTRA SYSTEM W/DEVICE KIT
PACK | Status: DC
Start: 2014-08-17 — End: 2014-12-28

## 2014-08-17 MED ORDER — ONETOUCH ULTRASOFT LANCETS MISC: Status: DC

## 2014-08-26 ENCOUNTER — Other Ambulatory Visit: Payer: Self-pay | Admitting: Internal Medicine

## 2014-09-17 ENCOUNTER — Other Ambulatory Visit: Payer: Self-pay | Admitting: Gastroenterology

## 2014-09-17 LAB — HM COLONOSCOPY

## 2014-09-24 ENCOUNTER — Encounter: Payer: Self-pay | Admitting: Podiatry

## 2014-09-24 ENCOUNTER — Ambulatory Visit (INDEPENDENT_AMBULATORY_CARE_PROVIDER_SITE_OTHER): Payer: BLUE CROSS/BLUE SHIELD | Admitting: Podiatry

## 2014-09-24 DIAGNOSIS — B351 Tinea unguium: Secondary | ICD-10-CM | POA: Diagnosis not present

## 2014-09-24 DIAGNOSIS — M79673 Pain in unspecified foot: Secondary | ICD-10-CM | POA: Diagnosis not present

## 2014-09-24 NOTE — Progress Notes (Signed)
Patient ID: Jason Wyatt, male   DOB: 1955/11/20, 59 y.o.   MRN: [id number] Complaint:  Visit Type: Patient returns to my office for continued preventative foot care services. Complaint: Patient states" my nails have grown long and thick and become painful to walk and wear shoes" Patient has been diagnosed with DM with neuropathy.. The patient presents for preventative foot care services. No changes to ROS  Podiatric Exam: Vascular: dorsalis pedis and posterior tibial pulses are palpable bilateral. Capillary return is immediate. Cold feet noted.  Sensorium: Absent Semmes Weinstein monofilament test. Normal tactile sensation bilaterally. Nail Exam: Pt has thick disfigured discolored nails with subungual debris noted bilateral entire nail hallux through fifth toenails Ulcer Exam: There is no evidence of ulcer or pre-ulcerative changes or infection. Orthopedic Exam: Muscle tone and strength are WNL. No limitations in general ROM. No crepitus or effusions noted. Foot type and digits show no abnormalities. Bony prominences are unremarkable. Skin: No Porokeratosis. No infection or ulcers  Diagnosis:  Onychomycosis, , Pain in right toe, pain in left toes  Treatment & Plan Procedures and Treatment: Consent by patient was obtained for treatment procedures. The patient understood the discussion of treatment and procedures well. All questions were answered thoroughly reviewed. Debridement of mycotic and hypertrophic toenails, 1 through 5 bilateral and clearing of subungual debris. No ulceration, no infection noted.  Return Visit-Office Procedure: Patient instructed to return to the office for a follow up visit 3 months for continued evaluation and treatment.

## 2014-10-07 ENCOUNTER — Ambulatory Visit (INDEPENDENT_AMBULATORY_CARE_PROVIDER_SITE_OTHER): Payer: BLUE CROSS/BLUE SHIELD | Admitting: Physician Assistant

## 2014-10-07 VITALS — BP 148/92 | HR 88 | Temp 97.2°F | Resp 18 | Ht 74.0 in | Wt 315.2 lb

## 2014-10-07 DIAGNOSIS — G629 Polyneuropathy, unspecified: Secondary | ICD-10-CM

## 2014-10-07 DIAGNOSIS — E1142 Type 2 diabetes mellitus with diabetic polyneuropathy: Secondary | ICD-10-CM | POA: Diagnosis not present

## 2014-10-07 NOTE — Progress Notes (Signed)
Subjective:    Patient ID: Jason Wyatt, male    DOB: December 07, 1955, 59 y.o.   MRN: [id number]  HPI 59 y.o. obese WM with HTN, DM 2 with CKD and peripheral neuropathy, RLS presents for foot exam. He follows with Dr. Prudence Davidson, podiatrist and has dystrophic nails, peripheral edema, and decreased sensation bilateral feet. He has good pulses, no ulcers. He has had recent back surgery and has lost 8 lbs due to feeling better and being able to move more.   Blood pressure 148/92, pulse 88, temperature 97.2 F (36.2 C), resp. rate 18, height _0  (1.88 m), weight 315 lb 3.2 oz (142.974 kg).  Past Medical History  Diagnosis Date  . Gout     takes Allopurinol daily and Colchicine if needed  . Complication of anesthesia 06/2010    "woke up next day w/ventilator on"  . High cholesterol     takes Fish Oil daily  . Exertional dyspnea 08/03/11  . Arthritis     "in my back"  . Prostate cancer   . Urinary frequency     takes Flomax daily  . Type II diabetes mellitus     takes Farxiga,Januvia,and Metformin daily  . GERD (gastroesophageal reflux disease)     takes Prevacid daily  . Hypertension     takes Enalapril daily  . Peripheral neuropathy     takes Sinemet daily  . Thyroid nodule     left side  . OSA (obstructive sleep apnea)     sleep study done about 36yr ago;uses CPAP  . Weakness     numbness and tingling  . Joint pain   . Chronic back pain     stenosis  . History of colon polyps   . History of blood transfusion    Current Outpatient Prescriptions on File Prior to Visit  Medication Sig Dispense Refill  . acyclovir (ZOVIRAX) 400 MG tablet TAKE 1 BY MOUTH DAILY AS NEEDED FOR COLD SORES 90 tablet 3  . allopurinol (ZYLOPRIM) 300 MG tablet TAKE 1 BY MOUTH AT BEDTIME 90 tablet 3  . aspirin EC 81 MG tablet Take 81 mg by mouth every morning.    . Blood Glucose Monitoring Suppl (ONE TOUCH ULTRA SYSTEM KIT) W/DEVICE KIT Check blood sugar 1 time daily-DX-E11.29 1 each 0  . canagliflozin  (INVOKANA) 300 MG TABS tablet Take 300 mg by mouth daily before breakfast. 90 tablet 3  . carbidopa-levodopa (SINEMET IR) 25-250 MG per tablet Take 1 tablet by mouth twice daily for restless legs. 180 tablet 4  . colchicine 0.6 MG tablet Take 0.6 mg by mouth 2 (two) times daily as needed (gout flare ups).     . DULoxetine (CYMBALTA) 60 MG capsule TAKE 1 BY MOUTH AT BEDTIME 90 capsule 2  . enalapril (VASOTEC) 20 MG tablet TAKE 1 BY MOUTH TWICE DAILY 180 tablet 3  . gabapentin (NEURONTIN) 300 MG capsule TAKE 1 BY MOUTH 3 TIMES DAILY 270 capsule 4  . glucose blood test strip Check blood sugar 1 time daily-DX-E11.29 100 each 12  . Lancets (ONETOUCH ULTRASOFT) lancets Check blood sugar 1 time daily-DX-E11.29 100 each 12  . lansoprazole (PREVACID) 30 MG capsule TAKE 1 BY MOUTH DAILY FOR HEARTBURN 90 capsule 1  . Magnesium 500 MG CAPS Take 500 mg by mouth daily.    . metFORMIN (GLUCOPHAGE) 1000 MG tablet TAKE 1 BY MOUTH TWICE DAILY WITH A MEAL 180 tablet 2  . Multiple Vitamin (MULTIVITAMIN WITH MINERALS) TABS Take 1 tablet  by mouth daily.    [name] NASONEX 50 MCG/ACT nasal spray USE 2 SPRAYS IN EACH NOSTRIL DAILY 17 g 2  . omega-3 acid ethyl esters (LOVAZA) 1 G capsule TAKE 2 BY MOUTH TWICE DAILY 360 capsule 3  . Vitamin D, Ergocalciferol, (DRISDOL) 50000 UNITS CAPS capsule Take 50,000 Units by mouth every other day.     No current facility-administered medications on file prior to visit.     Review of Systems  Constitutional: Negative.   HENT: Negative.   Respiratory: Negative.   Cardiovascular: Negative.   Gastrointestinal: Negative.   Genitourinary: Negative.   Musculoskeletal: Negative for myalgias, back pain and neck pain.  Skin: Negative.   Neurological: Positive for numbness (bilateral feet). Negative for dizziness, tremors and seizures.  Psychiatric/Behavioral: Negative.        Objective:   Physical Exam  Constitutional: He is oriented to person, place, and time. He appears  well-developed and well-nourished.  obese  HENT:  Head: Normocephalic and atraumatic.  Right Ear: External ear normal.  Left Ear: External ear normal.  Nose: Nose normal.  Eyes: Conjunctivae and EOM are normal.  Neck: Normal range of motion. Neck supple. No JVD present. No thyromegaly present.  Cardiovascular: Normal rate, regular rhythm, normal heart sounds and intact distal pulses.   Pulmonary/Chest: Effort normal and breath sounds normal.  Abdominal: Soft. Bowel sounds are normal. He exhibits no distension and no mass. There is no tenderness.  Musculoskeletal: Normal range of motion. He exhibits no edema or tenderness.  Lymphadenopathy:    He has no cervical adenopathy.  Neurological: He is alert and oriented to person, place, and time. He has normal reflexes. No cranial nerve deficit. Coordination normal.  Skin: Skin is warm and dry.  Bilateral feet with decreased sensation half way up his leg, mild edema/venous stasis changes, good pulses DP/TP, dystrophic nails, calloses, bilateral toes/feet without ulcers.   Psychiatric: He has a normal mood and affect. His behavior is normal. Judgment and thought content normal.  Nursing note and vitals reviewed.      Assessment & Plan:  1. DM type 2 with diabetic peripheral neuropathy Will send RX for shoe insert for p. Neuropathy  - PR DIAB SHOE FOR DENSITY INSERT

## 2014-10-07 NOTE — Patient Instructions (Signed)
Diabetes and Foot Care Diabetes may cause you to have problems because of poor blood supply (circulation) to your feet and legs. This may cause the skin on your feet to become thinner, break easier, and heal more slowly. Your skin may become dry, and the skin may peel and crack. You may also have nerve damage in your legs and feet causing decreased feeling in them. You may not notice minor injuries to your feet that could lead to infections or more serious problems. Taking care of your feet is one of the most important things you can do for yourself.  HOME CARE INSTRUCTIONS  Wear shoes at all times, even in the house. Do not go barefoot. Bare feet are easily injured.  Check your feet daily for blisters, cuts, and redness. If you cannot see the bottom of your feet, use a mirror or ask someone for help.  Wash your feet with warm water (do not use hot water) and mild soap. Then pat your feet and the areas between your toes until they are completely dry. Do not soak your feet as this can dry your skin.  Apply a moisturizing lotion or petroleum jelly (that does not contain alcohol and is unscented) to the skin on your feet and to dry, brittle toenails. Do not apply lotion between your toes.  Trim your toenails straight across. Do not dig under them or around the cuticle. File the edges of your nails with an emery board or nail file.  Do not cut corns or calluses or try to remove them with medicine.  Wear clean socks or stockings every day. Make sure they are not too tight. Do not wear knee-high stockings since they may decrease blood flow to your legs.  Wear shoes that fit properly and have enough cushioning. To break in new shoes, wear them for just a few hours a day. This prevents you from injuring your feet. Always look in your shoes before you put them on to be sure there are no objects inside.  Do not cross your legs. This may decrease the blood flow to your feet.  If you find a minor scrape,  cut, or break in the skin on your feet, keep it and the skin around it clean and dry. These areas may be cleansed with mild soap and water. Do not cleanse the area with peroxide, alcohol, or iodine.  When you remove an adhesive bandage, be sure not to damage the skin around it.  If you have a wound, look at it several times a day to make sure it is healing.  Do not use heating pads or hot water bottles. They may burn your skin. If you have lost feeling in your feet or legs, you may not know it is happening until it is too late.  Make sure your health care provider performs a complete foot exam at least annually or more often if you have foot problems. Report any cuts, sores, or bruises to your health care provider immediately. SEEK MEDICAL CARE IF:   You have an injury that is not healing.  You have cuts or breaks in the skin.  You have an ingrown nail.  You notice redness on your legs or feet.  You feel burning or tingling in your legs or feet.  You have pain or cramps in your legs and feet.  Your legs or feet are numb.  Your feet always feel cold. SEEK IMMEDIATE MEDICAL CARE IF:   There is increasing redness,   swelling, or pain in or around a wound.  There is a red line that goes up your leg.  Pus is coming from a wound.  You develop a fever or as directed by your health care provider.  You notice a bad smell coming from an ulcer or wound. Document Released: 12/31/1999 Document Revised: 09/04/2012 Document Reviewed: 06/11/2012 Exit[employer] Patient Information 2015 [employer]. This information is not intended to replace advice given to you by your health care provider. Make sure you discuss any questions you have with your health care provider.  

## 2014-10-19 ENCOUNTER — Ambulatory Visit (INDEPENDENT_AMBULATORY_CARE_PROVIDER_SITE_OTHER): Payer: BLUE CROSS/BLUE SHIELD | Admitting: Internal Medicine

## 2014-10-19 ENCOUNTER — Encounter: Payer: Self-pay | Admitting: Internal Medicine

## 2014-10-19 VITALS — BP 126/80 | HR 104 | Temp 98.2°F | Resp 20 | Ht 74.0 in | Wt 315.0 lb

## 2014-10-19 DIAGNOSIS — J0141 Acute recurrent pansinusitis: Secondary | ICD-10-CM

## 2014-10-19 MED ORDER — PREDNISONE 20 MG PO TABS: ORAL_TABLET | ORAL | Status: DC

## 2014-10-19 MED ORDER — AZITHROMYCIN 250 MG PO TABS: ORAL_TABLET | ORAL | Status: DC

## 2014-10-19 NOTE — Progress Notes (Signed)
Patient ID: Jason Wyatt, male   DOB: 18-Jan-1955, 59 y.o.   MRN: [id number]  HPI  Patient presents to the office for evaluation of cough.  It has been going on for 1 weeks.  Patient reports dry.  They also endorse change in voice, postnasal drip and nasal congestion, sinus pressure, mild sinus sputum production, right ear pain..  They have tried antihistamines.  They report that nothing has worked.  They denies other sick contacts.  He reports that he has been outside a lot lately.  He reports that his blood sugars have been 100-141.    Review of Systems  Constitutional: Negative for fever, chills and malaise/fatigue.  HENT: Positive for congestion. Negative for ear pain and sore throat.   Respiratory: Positive for cough. Negative for shortness of breath and wheezing.   Cardiovascular: Negative for chest pain, palpitations and leg swelling.  Skin: Negative.   Neurological: Negative for headaches.    PE:  Filed Vitals:   10/19/14 1404  BP: 126/80  Pulse: 104  Temp: 98.2 F (36.8 C)  Resp: 20   General:  Alert and non-toxic, WDWN, NAD HEENT: NCAT, PERLA, EOM normal, no occular discharge or erythema.  Nasal mucosal edema with sinus tenderness to palpation.  Oropharynx clear with minimal oropharyngeal edema and erythema.  Mucous membranes moist and pink. Neck:  Cervical adenopathy Chest:  RRR no MRGs.  Lungs clear to auscultation A&P with no wheezes rhonchi or rales.   Abdomen: +BS x 4 quadrants, soft, non-tender, no guarding, rigidity, or rebound. Skin: warm and dry no rash Neuro: A&Ox4, CN II-XII grossly intact  Assessment and Plan:   1. Acute recurrent pansinusitis -prednisone -zpak -mucinex -nasonex -allegra -nasal saline

## 2014-10-19 NOTE — Patient Instructions (Signed)
Take the prednisone as prescribed.  Finish the medication until they are gone.  Please take the zpak if you get a fever, chills, or are not feeling much better in 2-3 days.  Please continue to use allegra and nasonex.  Please use mucinex twice daily.  Make sure that you are drinking plenty of water.

## 2014-10-23 ENCOUNTER — Telehealth: Payer: Self-pay | Admitting: Internal Medicine

## 2014-10-23 NOTE — Telephone Encounter (Signed)
Patient calling because he is not feeling any better on prednisone.  Sent home with a zpak at visit.  Will advise him to start the zpak.

## 2014-10-23 NOTE — Telephone Encounter (Signed)
Patient aware.

## 2014-10-29 ENCOUNTER — Ambulatory Visit (INDEPENDENT_AMBULATORY_CARE_PROVIDER_SITE_OTHER): Payer: BLUE CROSS/BLUE SHIELD | Admitting: Internal Medicine

## 2014-10-29 VITALS — BP 124/78 | HR 80 | Temp 98.2°F | Resp 18 | Ht 74.0 in | Wt 308.0 lb

## 2014-10-29 DIAGNOSIS — E1142 Type 2 diabetes mellitus with diabetic polyneuropathy: Secondary | ICD-10-CM

## 2014-10-29 DIAGNOSIS — Z79899 Other long term (current) drug therapy: Secondary | ICD-10-CM

## 2014-10-29 DIAGNOSIS — E1129 Type 2 diabetes mellitus with other diabetic kidney complication: Secondary | ICD-10-CM | POA: Diagnosis not present

## 2014-10-29 DIAGNOSIS — I10 Essential (primary) hypertension: Secondary | ICD-10-CM | POA: Diagnosis not present

## 2014-10-29 DIAGNOSIS — E785 Hyperlipidemia, unspecified: Secondary | ICD-10-CM | POA: Diagnosis not present

## 2014-10-29 DIAGNOSIS — Z23 Encounter for immunization: Secondary | ICD-10-CM | POA: Diagnosis not present

## 2014-10-29 LAB — CBC WITH DIFFERENTIAL/PLATELET
Basophils Absolute: 0.1 10*3/uL (ref 0.0–0.1)
Basophils Relative: 1 % (ref 0–1)
Eosinophils Absolute: 0.2 10*3/uL (ref 0.0–0.7)
Eosinophils Relative: 2 % (ref 0–5)
HCT: 43.9 % (ref 39.0–52.0)
Hemoglobin: 14.7 g/dL (ref 13.0–17.0)
Lymphocytes Relative: 19 % (ref 12–46)
Lymphs Abs: 2 10*3/uL (ref 0.7–4.0)
MCH: 28.5 pg (ref 26.0–34.0)
MCHC: 33.5 g/dL (ref 30.0–36.0)
MCV: 85.2 fL (ref 78.0–100.0)
MPV: 9.3 fL (ref 8.6–12.4)
Monocytes Absolute: 0.8 10*3/uL (ref 0.1–1.0)
Monocytes Relative: 8 % (ref 3–12)
Neutro Abs: 7.2 10*3/uL (ref 1.7–7.7)
Neutrophils Relative %: 70 % (ref 43–77)
Platelets: 291 10*3/uL (ref 150–400)
RBC: 5.15 MIL/uL (ref 4.22–5.81)
RDW: 17.8 % — ABNORMAL HIGH (ref 11.5–15.5)
WBC: 10.3 10*3/uL (ref 4.0–10.5)

## 2014-10-29 LAB — LIPID PANEL
Cholesterol: 173 mg/dL (ref 125–200)
HDL: 31 mg/dL — ABNORMAL LOW (ref >=40)
LDL Cholesterol: 85 mg/dL (ref <130)
Total CHOL/HDL Ratio: 5.6 Ratio — ABNORMAL HIGH (ref <=5.0)
Triglycerides: 285 mg/dL — ABNORMAL HIGH (ref <150)
VLDL: 57 mg/dL — ABNORMAL HIGH (ref <30)

## 2014-10-29 LAB — BASIC METABOLIC PANEL WITH GFR
BUN: 17 mg/dL (ref 7–25)
CO2: 23 mmol/L (ref 20–31)
Calcium: 9.6 mg/dL (ref 8.6–10.3)
Chloride: 102 mmol/L (ref 98–110)
Creat: 1.05 mg/dL (ref 0.70–1.25)
GFR, Est African American: 88 mL/min (ref >=60)
GFR, Est Non African American: 76 mL/min (ref >=60)
Glucose, Bld: 105 mg/dL — ABNORMAL HIGH (ref 65–99)
Potassium: 4.7 mmol/L (ref 3.5–5.3)
Sodium: 138 mmol/L (ref 135–146)

## 2014-10-29 LAB — HEPATIC FUNCTION PANEL
ALT: 26 U/L (ref 9–46)
AST: 21 U/L (ref 10–35)
Albumin: 4.2 g/dL (ref 3.6–5.1)
Alkaline Phosphatase: 75 U/L (ref 40–115)
Bilirubin, Direct: 0.1 mg/dL (ref <=0.2)
Indirect Bilirubin: 0.5 mg/dL (ref 0.2–1.2)
Total Bilirubin: 0.6 mg/dL (ref 0.2–1.2)
Total Protein: 7.1 g/dL (ref 6.1–8.1)

## 2014-10-29 LAB — TSH: TSH: 2.094 u[IU]/mL (ref 0.350–4.500)

## 2014-10-29 NOTE — Progress Notes (Signed)
Patient ID: Jason Wyatt, male   DOB: December 31, 1955, 59 y.o.   MRN: [id number]  Assessment and Plan:  Hypertension:  -Continue medication -monitor blood pressure at home. -Continue DASH diet -Reminder to go to the ER if any CP, SOB, nausea, dizziness, severe HA, changes vision/speech, left arm numbness and tingling and jaw pain.  Cholesterol - Continue diet and exercise -Check cholesterol.   Diabetes with diabetic chronic kidney disease -Continue diet and exercise.  -Check A1C  Vitamin D Def -check level -continue medications.   Continue diet and meds as discussed. Further disposition pending results of labs. Discussed med's effects and SE's.    HPI 59 y.o. male  presents for 3 month follow up with hypertension, hyperlipidemia, diabetes and vitamin D deficiency.   His blood pressure has been controlled at home, today their BP is BP: 124/78 mmHg.He does not workout. He denies chest pain, shortness of breath, dizziness.   He is on cholesterol medication and denies myalgias. His cholesterol is at goal. The cholesterol was:  07/27/2014: Cholesterol 153; HDL 22*; LDL Cholesterol 64; Triglycerides 333*   He has been working on diet and exercise for diabetes with diabetic chronic kidney disease, he is on bASA, he is on ACE/ARB, and denies  foot ulcerations, hyperglycemia, hypoglycemia , increased appetite, nausea, polydipsia, polyuria, visual disturbances, vomiting and weight loss. Last A1C was: 07/27/2014: Hgb A1c MFr Bld 7.6*.  He want to know whether his shoe inserts for his peripheral neuropathy.      Patient is on Vitamin D supplement. 07/27/2014: Vit D, 25-Hydroxy 60  He reports that this is the best he has felt in many years.  He is doing activity that he hasn't been able to do in many years.  Current Medications:  Current Outpatient Prescriptions on File Prior to Visit  Medication Sig Dispense Refill  . acyclovir (ZOVIRAX) 400 MG tablet TAKE 1 BY MOUTH DAILY AS NEEDED FOR COLD SORES  90 tablet 3  . allopurinol (ZYLOPRIM) 300 MG tablet TAKE 1 BY MOUTH AT BEDTIME 90 tablet 3  . aspirin EC 81 MG tablet Take 81 mg by mouth every morning.    . Blood Glucose Monitoring Suppl (ONE TOUCH ULTRA SYSTEM KIT) W/DEVICE KIT Check blood sugar 1 time daily-DX-E11.29 1 each 0  . canagliflozin (INVOKANA) 300 MG TABS tablet Take 300 mg by mouth daily before breakfast. 90 tablet 3  . carbidopa-levodopa (SINEMET IR) 25-250 MG per tablet Take 1 tablet by mouth twice daily for restless legs. 180 tablet 4  . colchicine 0.6 MG tablet Take 0.6 mg by mouth 2 (two) times daily as needed (gout flare ups).     . DULoxetine (CYMBALTA) 60 MG capsule TAKE 1 BY MOUTH AT BEDTIME 90 capsule 2  . enalapril (VASOTEC) 20 MG tablet TAKE 1 BY MOUTH TWICE DAILY 180 tablet 3  . fexofenadine (ALLEGRA) 60 MG tablet Take 60 mg by mouth 2 (two) times daily.    [name] gabapentin (NEURONTIN) 300 MG capsule TAKE 1 BY MOUTH 3 TIMES DAILY 270 capsule 4  . glucose blood test strip Check blood sugar 1 time daily-DX-E11.29 100 each 12  . Lancets (ONETOUCH ULTRASOFT) lancets Check blood sugar 1 time daily-DX-E11.29 100 each 12  . lansoprazole (PREVACID) 30 MG capsule TAKE 1 BY MOUTH DAILY FOR HEARTBURN 90 capsule 1  . Magnesium 500 MG CAPS Take 500 mg by mouth daily.    . metFORMIN (GLUCOPHAGE) 1000 MG tablet TAKE 1 BY MOUTH TWICE DAILY WITH A MEAL 180 tablet  2  . Multiple Vitamin (MULTIVITAMIN WITH MINERALS) TABS Take 1 tablet by mouth daily.    [name] NASONEX 50 MCG/ACT nasal spray USE 2 SPRAYS IN EACH NOSTRIL DAILY 17 g 2  . omega-3 acid ethyl esters (LOVAZA) 1 G capsule TAKE 2 BY MOUTH TWICE DAILY 360 capsule 3  . Vitamin D, Ergocalciferol, (DRISDOL) 50000 UNITS CAPS capsule Take 50,000 Units by mouth every other day.     No current facility-administered medications on file prior to visit.   Medical History:  Past Medical History  Diagnosis Date  . Gout     takes Allopurinol daily and Colchicine if needed  . Complication of  anesthesia 06/2010    "woke up next day w/ventilator on"  . High cholesterol     takes Fish Oil daily  . Exertional dyspnea 08/03/11  . Arthritis     "in my back"  . Prostate cancer ([place])   . Urinary frequency     takes Flomax daily  . Type II diabetes mellitus ([place])     takes Farxiga,Januvia,and Metformin daily  . GERD (gastroesophageal reflux disease)     takes Prevacid daily  . Hypertension     takes Enalapril daily  . Peripheral neuropathy ([facility])     takes Sinemet daily  . Thyroid nodule     left side  . OSA (obstructive sleep apnea)     sleep study done about 47yr ago;uses CPAP  . Weakness     numbness and tingling  . Joint pain   . Chronic back pain     stenosis  . History of colon polyps   . History of blood transfusion    Allergies:  Allergies  Allergen Reactions  . Flaxseed [Linseed Oil] Rash  . Lyrica [Pregabalin] Itching and Rash  . Niaspan [Niacin Er] Rash     Review of Systems:  Review of Systems  Constitutional: Negative for fever, chills and malaise/fatigue.  HENT: Negative for congestion, ear pain and sore throat.   Eyes: Negative.   Respiratory: Negative for cough, shortness of breath and wheezing.   Cardiovascular: Negative for chest pain, palpitations and leg swelling.  Gastrointestinal: Negative for heartburn, diarrhea, constipation, blood in stool and melena.  Musculoskeletal: Negative for back pain.  Skin: Negative.   Neurological: Positive for sensory change. Negative for dizziness, loss of consciousness and headaches.  Psychiatric/Behavioral: Negative for depression. The patient is not nervous/anxious and does not have insomnia.     Family history- Review and unchanged  Social history- Review and unchanged  Physical Exam: BP 124/78 mmHg  Pulse 80  Temp(Src) 98.2 F (36.8 C) (Temporal)  Resp 18  Ht 6' 2" (1.88 m)  Wt 308 lb (139.708 kg)  BMI 39.53 kg/m2  SpO2 98% Wt Readings from Last 3 Encounters:  10/29/14 308 lb (139.708 kg)   10/19/14 315 lb (142.883 kg)  10/07/14 315 lb 3.2 oz (142.974 kg)   General Appearance: Well nourished well developed, non-toxic appearing, in no apparent distress. Eyes: PERRLA, EOMs, conjunctiva no swelling or erythema ENT/Mouth: Ear canals clear with no erythema, swelling, or discharge.  TMs normal bilaterally, oropharynx clear, moist, with no exudate.   Neck: Supple, thyroid normal, no JVD, no cervical adenopathy.  Respiratory: Respiratory effort normal, breath sounds clear A&P, no wheeze, rhonchi or rales noted.  No retractions, no accessory muscle usage Cardio: RRR with no MRGs. No noted edema.  Abdomen: Soft, + BS.  Non tender, no guarding, rebound, hernias, masses. Musculoskeletal: Full ROM, 5/5 strength, Normal  gait Skin: Warm, dry without rashes, lesions, ecchymosis.  Neuro: Awake and oriented X 3, Cranial nerves intact. No cerebellar symptoms.  Psych: normal affect, Insight and Judgment appropriate.    Starlyn Skeans, PA-C 10:21 AM [facility]

## 2014-10-30 LAB — HEMOGLOBIN A1C
Hgb A1c MFr Bld: 7.5 % — ABNORMAL HIGH (ref <5.7)
Mean Plasma Glucose: 169 mg/dL — ABNORMAL HIGH (ref <117)

## 2014-11-05 ENCOUNTER — Other Ambulatory Visit: Payer: Self-pay | Admitting: Internal Medicine

## 2014-12-06 ENCOUNTER — Encounter: Payer: Self-pay | Admitting: *Deleted

## 2014-12-17 ENCOUNTER — Other Ambulatory Visit: Payer: Self-pay | Admitting: Internal Medicine

## 2014-12-22 ENCOUNTER — Ambulatory Visit: Payer: Self-pay | Admitting: Orthopedic Surgery

## 2014-12-24 ENCOUNTER — Encounter: Payer: Self-pay | Admitting: Podiatry

## 2014-12-24 ENCOUNTER — Ambulatory Visit: Payer: BLUE CROSS/BLUE SHIELD | Admitting: Podiatry

## 2014-12-24 ENCOUNTER — Ambulatory Visit (INDEPENDENT_AMBULATORY_CARE_PROVIDER_SITE_OTHER): Payer: BLUE CROSS/BLUE SHIELD | Admitting: Podiatry

## 2014-12-24 DIAGNOSIS — B351 Tinea unguium: Secondary | ICD-10-CM | POA: Diagnosis not present

## 2014-12-24 DIAGNOSIS — M79676 Pain in unspecified toe(s): Secondary | ICD-10-CM | POA: Diagnosis not present

## 2014-12-24 DIAGNOSIS — Q828 Other specified congenital malformations of skin: Secondary | ICD-10-CM

## 2014-12-24 NOTE — Progress Notes (Signed)
He presents today with a chief complaint of painful elongated toenails with causing calluses plantar aspect of the bilateral foot.  Objective: Vital signs are stable alert and oriented 3. Pulses are strongly palpable. Neurologic sensorium is slightly diminished bilaterally possibly secondary to diabetes or back surgery which has been recent. His toenails are thick yellow dystrophic likely mycotic and painful on palpation. He has porokeratotic lesions to the plantar aspect of the bilateral foot without lesions or wounds.  Assessment: Pain in limb secondary to onychomycosis and poor keratoses bilateral.  Plan: Debridement of nails and all reactive hyperkeratotic tissues bilaterally.

## 2014-12-28 ENCOUNTER — Encounter (HOSPITAL_BASED_OUTPATIENT_CLINIC_OR_DEPARTMENT_OTHER): Payer: Self-pay | Admitting: *Deleted

## 2014-12-28 NOTE — Progress Notes (Signed)
NPO AFTER MN.  ARRIVE AT 1015 (PT SAYS MORE TOWARD 1030ish, SON IS GETTING OFF WORK AT 1000).  NEEDS ISTAT.  CURRENT EKG IN CHART AND EPIC.  WILL TAKE SINEMET, PREVACID, AND NEURONTIN AM DOS W/ SIPS OF WATER.

## 2014-12-31 ENCOUNTER — Ambulatory Visit (HOSPITAL_BASED_OUTPATIENT_CLINIC_OR_DEPARTMENT_OTHER): Payer: BLUE CROSS/BLUE SHIELD | Admitting: Anesthesiology

## 2014-12-31 ENCOUNTER — Encounter (HOSPITAL_BASED_OUTPATIENT_CLINIC_OR_DEPARTMENT_OTHER): Payer: Self-pay

## 2014-12-31 ENCOUNTER — Encounter (HOSPITAL_BASED_OUTPATIENT_CLINIC_OR_DEPARTMENT_OTHER)
Admission: RE | Disposition: A | Discharge status: Home / Self Care | Payer: Self-pay | Source: Ambulatory Visit | Attending: Specialist

## 2014-12-31 ENCOUNTER — Ambulatory Visit (HOSPITAL_BASED_OUTPATIENT_CLINIC_OR_DEPARTMENT_OTHER)
Admission: RE | Admit: 2014-12-31 | Discharge: 2014-12-31 | Disposition: A | Discharge status: Home / Self Care | Payer: BLUE CROSS/BLUE SHIELD | Source: Ambulatory Visit | Attending: Specialist | Admitting: Specialist

## 2014-12-31 DIAGNOSIS — Z8546 Personal history of malignant neoplasm of prostate: Secondary | ICD-10-CM | POA: Diagnosis not present

## 2014-12-31 DIAGNOSIS — M67462 Ganglion, left knee: Secondary | ICD-10-CM | POA: Insufficient documentation

## 2014-12-31 DIAGNOSIS — K219 Gastro-esophageal reflux disease without esophagitis: Secondary | ICD-10-CM | POA: Diagnosis not present

## 2014-12-31 DIAGNOSIS — R350 Frequency of micturition: Secondary | ICD-10-CM | POA: Diagnosis not present

## 2014-12-31 DIAGNOSIS — M109 Gout, unspecified: Secondary | ICD-10-CM | POA: Insufficient documentation

## 2014-12-31 DIAGNOSIS — S83282A Other tear of lateral meniscus, current injury, left knee, initial encounter: Secondary | ICD-10-CM | POA: Insufficient documentation

## 2014-12-31 DIAGNOSIS — E785 Hyperlipidemia, unspecified: Secondary | ICD-10-CM | POA: Diagnosis not present

## 2014-12-31 DIAGNOSIS — K449 Diaphragmatic hernia without obstruction or gangrene: Secondary | ICD-10-CM | POA: Insufficient documentation

## 2014-12-31 DIAGNOSIS — G4733 Obstructive sleep apnea (adult) (pediatric): Secondary | ICD-10-CM | POA: Diagnosis not present

## 2014-12-31 DIAGNOSIS — E1140 Type 2 diabetes mellitus with diabetic neuropathy, unspecified: Secondary | ICD-10-CM | POA: Insufficient documentation

## 2014-12-31 DIAGNOSIS — M179 Osteoarthritis of knee, unspecified: Secondary | ICD-10-CM | POA: Diagnosis not present

## 2014-12-31 DIAGNOSIS — Z79899 Other long term (current) drug therapy: Secondary | ICD-10-CM | POA: Insufficient documentation

## 2014-12-31 DIAGNOSIS — I129 Hypertensive chronic kidney disease with stage 1 through stage 4 chronic kidney disease, or unspecified chronic kidney disease: Secondary | ICD-10-CM | POA: Diagnosis not present

## 2014-12-31 DIAGNOSIS — X58XXXA Exposure to other specified factors, initial encounter: Secondary | ICD-10-CM | POA: Diagnosis not present

## 2014-12-31 DIAGNOSIS — M2342 Loose body in knee, left knee: Secondary | ICD-10-CM | POA: Insufficient documentation

## 2014-12-31 DIAGNOSIS — M94262 Chondromalacia, left knee: Secondary | ICD-10-CM | POA: Insufficient documentation

## 2014-12-31 DIAGNOSIS — S83242A Other tear of medial meniscus, current injury, left knee, initial encounter: Secondary | ICD-10-CM | POA: Insufficient documentation

## 2014-12-31 DIAGNOSIS — M1711 Unilateral primary osteoarthritis, right knee: Secondary | ICD-10-CM

## 2014-12-31 DIAGNOSIS — N182 Chronic kidney disease, stage 2 (mild): Secondary | ICD-10-CM | POA: Diagnosis not present

## 2014-12-31 HISTORY — DX: Presence of spectacles and contact lenses: Z97.3

## 2014-12-31 HISTORY — DX: Personal history of colonic polyps: Z86.010

## 2014-12-31 HISTORY — DX: Unspecified urinary incontinence: R32

## 2014-12-31 HISTORY — DX: Allergic rhinitis, unspecified: J30.9

## 2014-12-31 HISTORY — DX: Personal history of other diseases of the digestive system: Z87.19

## 2014-12-31 HISTORY — DX: Unspecified osteoarthritis, unspecified site: M19.90

## 2014-12-31 HISTORY — DX: Chronic kidney disease, stage 2 (mild): N18.2

## 2014-12-31 HISTORY — DX: Obstructive sleep apnea (adult) (pediatric): G47.33

## 2014-12-31 HISTORY — DX: Personal history of adenomatous and serrated colon polyps: Z86.0101

## 2014-12-31 HISTORY — DX: Unspecified tear of unspecified meniscus, current injury, left knee, initial encounter: S83.207A

## 2014-12-31 HISTORY — DX: Dependence on other enabling machines and devices: Z99.89

## 2014-12-31 HISTORY — DX: Unspecified right bundle-branch block: I45.10

## 2014-12-31 HISTORY — PX: KNEE ARTHROSCOPY WITH MEDIAL MENISECTOMY: SHX5651

## 2014-12-31 HISTORY — DX: Preglaucoma, unspecified, unspecified eye: H40.009

## 2014-12-31 HISTORY — DX: Hyperlipidemia, unspecified: E78.5

## 2014-12-31 LAB — GLUCOSE, CAPILLARY: Glucose-Capillary: 146 mg/dL — ABNORMAL HIGH (ref 65–99)

## 2014-12-31 LAB — POCT I-STAT 4, (NA,K, GLUC, HGB,HCT)
Glucose, Bld: 115 mg/dL — ABNORMAL HIGH (ref 65–99)
HCT: 44 % (ref 39.0–52.0)
Hemoglobin: 15 g/dL (ref 13.0–17.0)
Potassium: 4.4 mmol/L (ref 3.5–5.1)
Sodium: 137 mmol/L (ref 135–145)

## 2014-12-31 SURGERY — ARTHROSCOPY, KNEE, WITH MEDIAL MENISCECTOMY
Anesthesia: General | Laterality: Left

## 2014-12-31 MED ORDER — ASPIRIN EC 81 MG PO TBEC: 325.0000 mg | DELAYED_RELEASE_TABLET | Freq: Every day | ORAL | Status: DC

## 2014-12-31 MED ORDER — LACTATED RINGERS IV SOLN
INTRAVENOUS | Status: DC
  Administered 2014-12-31: 12:00:00 via INTRAVENOUS
  Filled 2014-12-31: qty 1000

## 2014-12-31 MED ORDER — SODIUM CHLORIDE 0.9 % IR SOLN: Status: DC | PRN

## 2014-12-31 MED ORDER — CEFAZOLIN SODIUM 10 G IJ SOLR
3.0000 g | INTRAMUSCULAR | Status: AC
  Administered 2014-12-31: 3 g via INTRAVENOUS
  Filled 2014-12-31: qty 3000

## 2014-12-31 MED ORDER — LIDOCAINE HCL (CARDIAC) 20 MG/ML IV SOLN
INTRAVENOUS | Status: DC | PRN
  Administered 2014-12-31: 100 mg via INTRAVENOUS

## 2014-12-31 MED ORDER — CEFAZOLIN SODIUM-DEXTROSE 2-3 GM-% IV SOLR
INTRAVENOUS | Status: AC
  Filled 2014-12-31: qty 50

## 2014-12-31 MED ORDER — EPINEPHRINE HCL 1 MG/ML IJ SOLN
INTRAMUSCULAR | Status: DC | PRN
  Administered 2014-12-31: 1 mg

## 2014-12-31 MED ORDER — FENTANYL CITRATE (PF) 100 MCG/2ML IJ SOLN
INTRAMUSCULAR | Status: AC
  Filled 2014-12-31: qty 2

## 2014-12-31 MED ORDER — MIDAZOLAM HCL 5 MG/5ML IJ SOLN
INTRAMUSCULAR | Status: DC | PRN
  Administered 2014-12-31: 2 mg via INTRAVENOUS

## 2014-12-31 MED ORDER — DEXAMETHASONE SODIUM PHOSPHATE 10 MG/ML IJ SOLN
INTRAMUSCULAR | Status: DC | PRN
  Administered 2014-12-31: 10 mg via INTRAVENOUS

## 2014-12-31 MED ORDER — BUPIVACAINE-EPINEPHRINE 0.5% -1:200000 IJ SOLN
INTRAMUSCULAR | Status: DC | PRN
  Administered 2014-12-31: 20 mL

## 2014-12-31 MED ORDER — DEXAMETHASONE SODIUM PHOSPHATE 10 MG/ML IJ SOLN
INTRAMUSCULAR | Status: AC
  Filled 2014-12-31: qty 1

## 2014-12-31 MED ORDER — MIDAZOLAM HCL 2 MG/2ML IJ SOLN
INTRAMUSCULAR | Status: AC
  Filled 2014-12-31: qty 2

## 2014-12-31 MED ORDER — PROPOFOL 10 MG/ML IV BOLUS
INTRAVENOUS | Status: AC
  Filled 2014-12-31: qty 20

## 2014-12-31 MED ORDER — ONDANSETRON HCL 4 MG/2ML IJ SOLN
INTRAMUSCULAR | Status: DC | PRN
  Administered 2014-12-31: 4 mg via INTRAVENOUS

## 2014-12-31 MED ORDER — LACTATED RINGERS IV SOLN
INTRAVENOUS | Status: DC
  Filled 2014-12-31: qty 1000

## 2014-12-31 MED ORDER — PROPOFOL 10 MG/ML IV BOLUS
INTRAVENOUS | Status: DC | PRN
  Administered 2014-12-31: 400 mg via INTRAVENOUS

## 2014-12-31 MED ORDER — SODIUM CHLORIDE 0.9 % IR SOLN
Status: DC | PRN
  Administered 2014-12-31: 6000 mL

## 2014-12-31 MED ORDER — KETOROLAC TROMETHAMINE 30 MG/ML IJ SOLN
INTRAMUSCULAR | Status: DC | PRN
  Administered 2014-12-31: 60 mg via INTRAVENOUS

## 2014-12-31 MED ORDER — OXYCODONE-ACETAMINOPHEN 5-325 MG PO TABS: 1.0000 | ORAL_TABLET | ORAL | Status: DC | PRN

## 2014-12-31 MED ORDER — CEFAZOLIN SODIUM 1-5 GM-% IV SOLN
INTRAVENOUS | Status: AC
  Filled 2014-12-31: qty 50

## 2014-12-31 MED ORDER — DOCUSATE SODIUM 100 MG PO CAPS: 100.0000 mg | ORAL_CAPSULE | Freq: Two times a day (BID) | ORAL | Status: DC | PRN

## 2014-12-31 MED ORDER — ONDANSETRON HCL 4 MG/2ML IJ SOLN
INTRAMUSCULAR | Status: AC
  Filled 2014-12-31: qty 2

## 2014-12-31 MED ORDER — FENTANYL CITRATE (PF) 100 MCG/2ML IJ SOLN
INTRAMUSCULAR | Status: DC | PRN
  Administered 2014-12-31 (×2): 25 ug via INTRAVENOUS
  Administered 2014-12-31: 50 ug via INTRAVENOUS

## 2014-12-31 MED ORDER — KETOROLAC TROMETHAMINE 30 MG/ML IJ SOLN
INTRAMUSCULAR | Status: AC
  Filled 2014-12-31: qty 2

## 2014-12-31 SURGICAL SUPPLY — 46 items
BANDAGE ELASTIC 6 VELCRO ST LF (GAUZE/BANDAGES/DRESSINGS) ×2 IMPLANT
BLADE 4.2CUDA (BLADE) IMPLANT
BLADE CUDA SHAVER 3.5 (BLADE) ×2 IMPLANT
BLADE GREAT WHITE 4.2 (BLADE) IMPLANT
BNDG COHESIVE 6X5 TAN NS LF (GAUZE/BANDAGES/DRESSINGS) ×2 IMPLANT
BOOTIES KNEE HIGH SLOAN (MISCELLANEOUS) ×2 IMPLANT
CANISTER SUCT LVC 12 LTR MEDI- (MISCELLANEOUS) ×2 IMPLANT
CANISTER SUCTION 1200CC (MISCELLANEOUS) IMPLANT
CANISTER SUCTION 2500CC (MISCELLANEOUS) IMPLANT
CANNULA ACUFLEX KIT 5X76 (CANNULA) IMPLANT
CLOTH BEACON ORANGE TIMEOUT ST (SAFETY) ×2 IMPLANT
CUTTER MENISCUS  4.2MM (BLADE)
CUTTER MENISCUS 4.2MM (BLADE) IMPLANT
DRAPE ARTHROSCOPY W/POUCH 114 (DRAPES) ×2 IMPLANT
DRSG EMULSION OIL 3X3 NADH (GAUZE/BANDAGES/DRESSINGS) ×2 IMPLANT
DURAPREP 26ML APPLICATOR (WOUND CARE) ×2 IMPLANT
ELECT REM PT RETURN 9FT ADLT (ELECTROSURGICAL)
ELECTRODE REM PT RTRN 9FT ADLT (ELECTROSURGICAL) IMPLANT
GLOVE SURG SS PI 8.0 STRL IVOR (GLOVE) ×2 IMPLANT
GOWN STRL REUS W/ TWL LRG LVL3 (GOWN DISPOSABLE) ×1 IMPLANT
GOWN STRL REUS W/ TWL XL LVL3 (GOWN DISPOSABLE) ×1 IMPLANT
GOWN STRL REUS W/TWL LRG LVL3 (GOWN DISPOSABLE) ×1
GOWN STRL REUS W/TWL XL LVL3 (GOWN DISPOSABLE) ×1
IV NS IRRIG 3000ML ARTHROMATIC (IV SOLUTION) ×4 IMPLANT
KIT ROOM TURNOVER WOR (KITS) ×2 IMPLANT
KNEE WRAP E Z 3 GEL PACK (MISCELLANEOUS) ×2 IMPLANT
MANIFOLD NEPTUNE II (INSTRUMENTS) IMPLANT
MINI VAC (SURGICAL WAND) IMPLANT
NDL SAFETY ECLIPSE 18X1.5 (NEEDLE) ×1 IMPLANT
NEEDLE FILTER BLUNT 18X 1/2SAF (NEEDLE) ×1
NEEDLE FILTER BLUNT 18X1 1/2 (NEEDLE) ×1 IMPLANT
NEEDLE HYPO 18GX1.5 SHARP (NEEDLE) ×1
PACK ARTHROSCOPY DSU (CUSTOM PROCEDURE TRAY) ×2 IMPLANT
PACK BASIN DAY SURGERY FS (CUSTOM PROCEDURE TRAY) ×2 IMPLANT
PADDING CAST COTTON 6X4 STRL (CAST SUPPLIES) ×2 IMPLANT
RESECTOR FULL RADIUS 4.2MM (BLADE) IMPLANT
SET ARTHROSCOPY TUBING (MISCELLANEOUS) ×1
SET ARTHROSCOPY TUBING LN (MISCELLANEOUS) ×1 IMPLANT
SPONGE GAUZE 4X4 12PLY (GAUZE/BANDAGES/DRESSINGS) ×2 IMPLANT
SUT ETHILON 4 0 PS 2 18 (SUTURE) ×2 IMPLANT
SYR 30ML LL (SYRINGE) ×2 IMPLANT
SYRINGE 10CC LL (SYRINGE) ×2 IMPLANT
TOWEL OR 17X24 6PK STRL BLUE (TOWEL DISPOSABLE) ×2 IMPLANT
TUBE CONNECTING 12X1/4 (SUCTIONS) IMPLANT
WAND 90 DEG TURBOVAC W/CORD (SURGICAL WAND) IMPLANT
WATER STERILE IRR 500ML POUR (IV SOLUTION) ×2 IMPLANT

## 2014-12-31 NOTE — Anesthesia Postprocedure Evaluation (Signed)
Anesthesia Post Note  Patient: Jason Wyatt  Procedure(s) Performed: Procedure(s) (LRB): LEFT KNEE ARTHROSCOPY, PARTIAL MEDIAL AND LATERAL  MENISECTOMIES WITH DEDRIDEMENT (Left)  Patient location during evaluation: PACU Anesthesia Type: General Level of consciousness: awake Pain management: pain level controlled Vital Signs Assessment: post-procedure vital signs reviewed and stable Respiratory status: spontaneous breathing Cardiovascular status: stable Anesthetic complications: no    Last Vitals:  Filed Vitals:   12/31/14 1118 12/31/14 1315  BP: 144/82 127/87  Pulse: 91 95  Temp: 36.6 C 37.1 C  Resp: 14 18    Last Pain: There were no vitals filed for this visit.               EDWARDS,Alexismarie Flaim

## 2014-12-31 NOTE — Anesthesia Procedure Notes (Signed)
Procedure Name: LMA Insertion Date/Time: 12/31/2014 12:32 PM Performed by: Justice Rocher Pre-anesthesia Checklist: Patient identified, Emergency Drugs available, Suction available and Patient being monitored Patient Re-evaluated:Patient Re-evaluated prior to inductionOxygen Delivery Method: Circle System Utilized Preoxygenation: Pre-oxygenation with 100% oxygen Intubation Type: IV induction Ventilation: Mask ventilation without difficulty LMA: LMA inserted LMA Size: 5.0 Number of attempts: 1 Airway Equipment and Method: bite block Placement Confirmation: positive ETCO2 Tube secured with: Tape Dental Injury: Teeth and Oropharynx as per pre-operative assessment

## 2014-12-31 NOTE — Anesthesia Preprocedure Evaluation (Signed)
Anesthesia Evaluation  Patient identified by MRN, date of birth, ID band Patient awake    Airway Mallampati: II  TM Distance: >3 FB Neck ROM: Full    Dental   Pulmonary sleep apnea ,    breath sounds clear to auscultation       Cardiovascular hypertension, + Peripheral Vascular Disease  + dysrhythmias  Rhythm:Regular Rate:Normal     Neuro/Psych    GI/Hepatic Neg liver ROS, hiatal hernia, GERD  ,  Endo/Other  diabetes  Renal/GU Renal disease     Musculoskeletal   Abdominal   Peds  Hematology   Anesthesia Other Findings   Reproductive/Obstetrics                             Anesthesia Physical Anesthesia Plan  ASA: III  Anesthesia Plan: General   Post-op Pain Management:    Induction: Intravenous  Airway Management Planned: LMA  Additional Equipment:   Intra-op Plan:   Post-operative Plan: Extubation in OR  Informed Consent: I have reviewed the patients History and Physical, chart, labs and discussed the procedure including the risks, benefits and alternatives for the proposed anesthesia with the patient or authorized representative who has indicated his/her understanding and acceptance.     Plan Discussed with: CRNA and Anesthesiologist  Anesthesia Plan Comments:         Anesthesia Quick Evaluation

## 2014-12-31 NOTE — Brief Op Note (Signed)
12/31/2014  1:01 PM  PATIENT:  Jason Wyatt  59 y.o. male  PRE-OPERATIVE DIAGNOSIS:  DKD MEDIAL MENISCUS TEAR LEFT KNEE  POST-OPERATIVE DIAGNOSIS:  DKD MEDIAL MENISCUS TEAR LEFT KNEE  PROCEDURE:  Procedure(s): LEFT KNEE ARTHROSCOPY, PARTIAL MEDIAL AND LATERAL  MENISECTOMIES WITH DEDRIDEMENT (Left)  SURGEON:  Surgeon(s) and Role:    * Susa Day, MD - Primary  PHYSICIAN ASSISTANT:   ASSISTANTS: Bissell   ANESTHESIA:   general  EBL:     BLOOD ADMINISTERED:none  DRAINS: none   LOCAL MEDICATIONS USED:  MARCAINE     SPECIMEN:  No Specimen  DISPOSITION OF SPECIMEN:  N/A  COUNTS:  YES  TOURNIQUET:  * No tourniquets in log *  DICTATION: .Other Dictation: Dictation Number [id number]  PLAN OF CARE: Discharge to home after PACU  PATIENT DISPOSITION:  PACU - hemodynamically stable.   Delay start of Pharmacological VTE agent (>24hrs) due to surgical blood loss or risk of bleeding: no

## 2014-12-31 NOTE — Transfer of Care (Signed)
Immediate Anesthesia Transfer of Care Note  Patient: Jason Wyatt  Procedure(s) Performed: Procedure(s) (LRB): LEFT KNEE ARTHROSCOPY, PARTIAL MEDIAL AND LATERAL  MENISECTOMIES WITH DEDRIDEMENT (Left)  Patient Location: PACU  Anesthesia Type: General  Level of Consciousness: awake, sedated, patient cooperative and responds to stimulation  Airway & Oxygen Therapy: Patient Spontanous Breathing and Patient connected to face mask oxygen  Post-op Assessment: Report given to PACU RN, Post -op Vital signs reviewed and stable and Patient moving all extremities  Post vital signs: Reviewed and stable  Complications: No apparent anesthesia complications

## 2014-12-31 NOTE — Discharge Instructions (Signed)
ARTHROSCOPIC KNEE SURGERY HOME CARE INSTRUCTIONS   PAIN You will be expected to have a moderate amount of pain in the affected knee for approximately two weeks.  However, the first two to four days will be the most severe in terms of the pain you will experience.  Prescriptions have been provided for you to take as needed for the pain.  The pain can be markedly reduced by using the ice/compressive bandage given.  Exchange the ice packs whenever they thaw.  During the night, keep the bandage on because it will still provide some compression for the swelling.  Also, keep the leg elevated on pillows above your heart, and this will help alleviate the pain and swelling.  MEDICATION Prescriptions have been provided to take as needed for pain. To prevent blood clots, take Aspirin 325mg  daily with a meal if not on a blood thinner and if no history of stomach ulcers.  ACTIVITY It is preferred that you stay on bedrest for approximately 24 hours.  However, you may go to the bathroom with help.  After this, you can start to be up and about progressively more.  Remember that the swelling may still increase after three to four days if you are up and doing too much.  You may put as much weight on the affected leg as pain will allow.  Use your crutches for comfort and safety.  However, as soon as you are able, you may discard the crutches and go without them.   DRESSING Keep the current dressing as dry as possible.  Two days after your surgery, you may remove the ice/compressive wrap, and surgical dressing.  You may now take a shower, but do not scrub the sounds directly with soap.  Let water rinse over these and gently wipe with your hand.  Reapply band-aids over the puncture wounds and more gauze if needed.  A slight amount of thin drainage can be normal at this time, and do not let it frighten you.  Reapply the ice/compressive wrap.  You may now repeat this every day each time you shower.  SYMPTOMS TO REPORT TO  YOUR DOCTOR  -Extreme pain.  -Extreme swelling.  -Temperature above 101 degrees that does not come down with acetaminophen     (Tylenol).  -Any changes in the feeling, color or movement of your toes.  -Extreme redness, heat, swelling or drainage at your incision  EXERCISE It is preferred that you begin to exercise on the day of your surgery.  Straight leg raises and short arc quads should be begun the afternoon or evening of surgery and continued until you come back for your follow-up appointment.   Attached is an instruction sheet on how to perform these two simple exercises.  Do these at least three times per day if not more.  You may bend your knee as much as is comfortable.  The puncture wounds may occasionally be slightly uncomfortable with bending of the knee.  Do not let this frighten you.  It is important to keep your knee motion, but do not overdo it.  If you have significant pain, simply do not bend the knee as far.   You will be given more exercises to perform at your first return visit.    RETURN APPOINTMENT Please make an appointment to be seen by your doctor in 10-14 days from your surgery.  Patient Signature:  ________________________________________________________  Nurse's Signature:  ________________________________________________________ARTHROSCOPIC KNEE SURGERY HOME CARE INSTRUCTIONS   PAIN You will be expected  to have a moderate amount of pain in the affected knee for approximately two weeks.  However, the first two to four days will be the most severe in terms of the pain you will experience.  Prescriptions have been provided for you to take as needed for the pain.  The pain can be markedly reduced by using the ice/compressive bandage given.  Exchange the ice packs whenever they thaw.  During the night, keep the bandage on because it will still provide some compression for the swelling.  Also, keep the leg elevated on pillows above your heart, and this will help alleviate  the pain and swelling.  MEDICATION Prescriptions have been provided to take as needed for pain. To prevent blood clots, take Aspirin 325mg  daily with a meal if not on a blood thinner and if no history of stomach ulcers.  ACTIVITY It is preferred that you stay on bedrest for approximately 24 hours.  However, you may go to the bathroom with help.  After this, you can start to be up and about progressively more.  Remember that the swelling may still increase after three to four days if you are up and doing too much.  You may put as much weight on the affected leg as pain will allow.  Use your crutches for comfort and safety.  However, as soon as you are able, you may discard the crutches and go without them.   DRESSING Keep the current dressing as dry as possible.  Two days after your surgery, you may remove the ice/compressive wrap, and surgical dressing.  You may now take a shower, but do not scrub the sounds directly with soap.  Let water rinse over these and gently wipe with your hand.  Reapply band-aids over the puncture wounds and more gauze if needed.  A slight amount of thin drainage can be normal at this time, and do not let it frighten you.  Reapply the ice/compressive wrap.  You may now repeat this every day each time you shower.  SYMPTOMS TO REPORT TO YOUR DOCTOR  -Extreme pain.  -Extreme swelling.  -Temperature above 101 degrees that does not come down with acetaminophen     (Tylenol).  -Any changes in the feeling, color or movement of your toes.  -Extreme redness, heat, swelling or drainage at your incision  EXERCISE It is preferred that you begin to exercise on the day of your surgery.  Straight leg raises and short arc quads should be begun the afternoon or evening of surgery and continued until you come back for your follow-up appointment.   Attached is an instruction sheet on how to perform these two simple exercises.  Do these at least three times per day if not more.  You may bend  your knee as much as is comfortable.  The puncture wounds may occasionally be slightly uncomfortable with bending of the knee.  Do not let this frighten you.  It is important to keep your knee motion, but do not overdo it.  If you have significant pain, simply do not bend the knee as far.   You will be given more exercises to perform at your first return visit.    RETURN APPOINTMENT Please make an appointment to be seen by your doctor in 14 days from your surgery.  Patient Signature:  ________________________________________________________  Nurse's Signature:  ________________________________________________________    Post Anesthesia Home Care Instructions  Activity: Get plenty of rest for the remainder of the day. A responsible adult should  stay with you for 24 hours following the procedure.  For the next 24 hours, DO NOT: -Drive a car -Paediatric nurse -Drink alcoholic beverages -Take any medication unless instructed by your physician -Make any legal decisions or sign important papers.  Meals: Start with liquid foods such as gelatin or soup. Progress to regular foods as tolerated. Avoid greasy, spicy, heavy foods. If nausea and/or vomiting occur, drink only clear liquids until the nausea and/or vomiting subsides. Call your physician if vomiting continues.  Special Instructions/Symptoms: Your throat may feel dry or sore from the anesthesia or the breathing tube placed in your throat during surgery. If this causes discomfort, gargle with warm salt water. The discomfort should disappear within 24 hours.  If you had a scopolamine patch placed behind your ear for the management of post- operative nausea and/or vomiting:  1. The medication in the patch is effective for 72 hours, after which it should be removed.  Wrap patch in a tissue and discard in the trash. Wash hands thoroughly with soap and water. 2. You may remove the patch earlier than 72 hours if you experience unpleasant side  effects which may include dry mouth, dizziness or visual disturbances. 3. Avoid touching the patch. Wash your hands with soap and water after contact with the patch.

## 2014-12-31 NOTE — H&P (Signed)
Jaxyn COAL SHIDELER is an 59 y.o. male.   Chief Complaint: L knee pain HPI: The patient is a 59 year old male who presents today for follow up of their knee. The patient is being followed for their left knee pain. They are now year(s) out from when symptoms began. Symptoms reported today include: pain, giving way (left knee) and instability. and report their pain level to be 2 / 10. The following medication has been used for pain control: none. The patient presents today following MRI. The patient has not gotten any relief of their symptoms with viscosupplementation.  Jarrette Marissa Calamity follows up for his MRI. He has got a medial meniscus tear with severe DJD of the patella and a ganglion in the tibiofibular joint.  His pain is in the patellofemoral joint and the medial compartment.  Past Medical History  Diagnosis Date  . Gout     takes Allopurinol daily and Colchicine if needed  . Urinary frequency     takes Flomax daily  . GERD (gastroesophageal reflux disease)     takes Prevacid daily  . Thyroid nodule     left side  . Chronic back pain     stenosis  . Hypertension   . OSA on CPAP   . Type II diabetes mellitus ([place])   . Peripheral neuropathy ([place])   . History of hiatal hernia   . History of Barrett's esophagus   . History of adenomatous polyp of colon     tubular adenoma's  . Urine incontinence   . Prostate cancer [facility]) urologist-  dr Alinda Money    dx 2013  s/p  radical non-nerve sparing prostatectomy 2014--  Stage pT2cNx,  PSA 3.48,  Gleason 3+3,  vol 55cc/  current PSA  0.01 (Aug 2016)  . Incomplete right bundle branch block (RBBB)   . Hyperlipidemia   . CKD (chronic kidney disease), stage II   . Acute meniscal tear of left knee   . Arthritis      back  . OA (osteoarthritis)     knee  . Complication of anesthesia cervical fusion surgery 06/2010    "woke up next day w/ventilator on" told due to OSA  . Borderline glaucoma   . Wears glasses   . Allergic rhinitis     Past Surgical  History  Procedure Laterality Date  . Posterior fusion cervical spine  07-05-2010    C1 --  C4  . Esophagogastroduodenoscopy  09/26/2011    Procedure: ESOPHAGOGASTRODUODENOSCOPY (EGD);  Surgeon: Cleotis Nipper, MD;  Location: [facility];  Service: Endoscopy;  Laterality: N/A;  . Robot assisted laparoscopic radical prostatectomy  02/07/2012    Procedure: ROBOTIC ASSISTED LAPAROSCOPIC RADICAL PROSTATECTOMY LEVEL 1;  Surgeon: Molli Hazard, MD;  Location: [facility];  Service: Urology;  Laterality: N/A;     . Wisdom teeth extracted   1982  . Left and right heart catheterization with coronary angiogram N/A 08/25/2011    Procedure: LEFT AND RIGHT HEART CATHETERIZATION WITH CORONARY ANGIOGRAM;  Surgeon: Peter M Martinique, MD;  Location: [facility] CATH LAB;  Service: Cardiovascular;  Laterality: N/A;   Normal coronary arteries and LVF, ef 55-60%  . Anal fissure repair  06-05-2002  . Cardiovascular stress test  01-01-2008    normal nuclear study/  no ischemia/  normal LV function and wall motion , 57%  . Transthoracic echocardiogram  08-03-2011    mild LVH, ef 55-60%/  trivial TR and PR  . Posterior lumbar fusion  05-18-2014  L4 -- S1  . Uvulopalatopharyngoplasty (uppp)/tonsillectomy/septoplasty  1990's    and Adenoidectomy    Family History  Problem Relation Age of Onset  . Hypertension Mother   . Dementia Mother   . Osteoporosis Mother   . Osteoarthritis Mother   . Cancer Father   . Hyperlipidemia Sister   . Hypertension Sister    Social History:  reports that he has never smoked. He has never used smokeless tobacco. He reports that he does not drink alcohol or use illicit drugs.  Allergies:  Allergies  Allergen Reactions  . Flaxseed [Linseed Oil] Rash  . Lyrica [Pregabalin] Itching and Rash  . Niaspan [Niacin Er] Rash    No prescriptions prior to admission    No results found for this or any previous visit (from the past 54 hour(s)). No results found.  Review of Systems   Constitutional: Negative.   HENT: Negative.   Eyes: Negative.   Respiratory: Negative.   Cardiovascular: Negative.   Gastrointestinal: Negative.   Genitourinary: Negative.   Musculoskeletal: Positive for back pain and joint pain.  Skin: Negative.   Neurological: Negative.     Height 6\' 2"  (1.88 m), weight 140.615 kg (310 lb). Physical Exam  Constitutional: He is oriented to person, place, and time. He appears well-developed.  HENT:  Head: Normocephalic.  Eyes: Pupils are equal, round, and reactive to light.  Neck: Normal range of motion.  Cardiovascular: Normal rate.   Respiratory: Effort normal.  GI: Soft.  Musculoskeletal:  On exam, tender in the medial joint line. Positive Mc[place]ray. Patellofemoral pain with compression. Trace effusion. No tenderness posteriorly.   Neurological: He is alert and oriented to person, place, and time.  Skin: Skin is warm and dry.     Assessment/Plan 1. Symptomatic medial meniscus tear, left knee. 2. Degenerative osteoarthrosis, patellofemoral joint.  I have discussed options given his mechanical symptoms and failing conservative treatment left knee arthroscopy. I had a long discussion with the patient concerning the risks and benefits of knee arthroscopy including help from the arthroscopic procedure as well as no help from the arthroscopic procedure or worsening of symptoms. Also discussed infection, DVT, PE, anesthetic complications, etc. Also discussed the possibility of repeat arthroscopic surgery required in the future or total knee replacement. I provided the patient with an illustrated handout and discussed it in detail as well as discussed the postoperative and perioperative courses and return to functional activities including work. Need for postoperative DVT prophylaxis was discussed as well. He wants to use a walker, given a prescription for Norco. He can take a half a tablet of it. No history of DVT or MRSA. He is here with his  son.  Left knee arthroscopy, partial medial meniscectomy, debridement  Anaiya Wisinski C 12/31/2014, 7:39 AM

## 2015-01-01 ENCOUNTER — Encounter (HOSPITAL_BASED_OUTPATIENT_CLINIC_OR_DEPARTMENT_OTHER): Payer: Self-pay | Admitting: Specialist

## 2015-01-01 NOTE — Op Note (Signed)
NAMEFLORA, Jason Wyatt NO.:  [id number]  MEDICAL RECORD NO.:  [id number]  LOCATION:                               FACILITY:  [facility]  PHYSICIAN:  Susa Day, M.D.    DATE OF BIRTH:  21-Apr-1955  DATE OF PROCEDURE:  12/31/2014 DATE OF DISCHARGE:  12/31/2014                              OPERATIVE REPORT   PREOPERATIVE DIAGNOSES:  Degenerative joint disease, medial meniscus tear, osteoarthrosis of left knee.  POSTOPERATIVE DIAGNOSES:  Medial meniscus tear, lateral meniscus tear, grade 4 osteoarthritis of patellofemoral joint, grade 3 of the medial lateral compartment.  PROCEDURE PERFORMED: 1. Left knee arthroscopy. 2. Partial medial and lateral meniscectomies. 3. Chondroplasty of medial femoral condyle, lateral femoral condyle,     tibial plateau and the patella.  ANESTHESIA:  General.  SURGEON:  Susa Day, M.D.  ASSISTANT:  Cleophas Dunker, PA.  HISTORY:  A 59 year old with locking, popping, giving way.  MRI indicating meniscal tear, also had osteoarthritis, particularly at the patellofemoral joint.  They have been refractory to conservative treatment, despite conservative treatment including corticosteroid injections, home exercise program with persistent mechanical symptoms and pain.  He was indicated for knee arthroscopy, partial meniscectomy and debridement.  Risks and benefits were discussed including bleeding, infection, damage to the neurovascular structures, no change in symptoms, worsening symptoms, DVT, PE, anesthetic complications, etc.  TECHNIQUE:  With the patient in supine position, after induction of adequate general anesthesia, 3 g Kefzol, left lower extremity was prepped and draped in usual sterile fashion.  A lateral parapatellar port was fashioned with a #11 blade.  Ingress cannula atraumatically placed.  Irrigant was utilized to insufflate the joint.  Under direct visualization, a medial parapatellar portal was fashioned with a  #11 blade after localization with 18-gauge needle sparing the medial meniscus.  Noted was extensive tearing of the medial meniscus radially and grade 3 chondromalacia, loose cartilaginous bodies.  I introduced a shaver and performed partial meniscectomy to a stable base, chondroplasty, femoral condyle, tibial plateau.  Fraying of the ACL was noted as well.  The remnant medial meniscus was stable to probe palpation.  Approximately 15% in the inner third of the meniscus was excised.  Lateral compartment revealed radial tearing of lateral meniscus.  This was shaved to a stable base as well.  Light chondroplasty performed of the femoral condyle here, over 4 chondral flap tears. Suprapatellar pouch revealed grade 4 changes of the patellofemoral joint, the entire sulcus, and the superior pole of the patella.  The inferior pole of patella, grade 3 changes light chondroplasty performed here.  Gutters were unremarkable.  Revisited all compartments.  No further pathology amenable to arthroscopic intervention.  I therefore, removed all instrumentation.  Portals were closed with 4-0 nylon simple sutures.  A 0.25% Marcaine with epinephrine was infiltrated in joint and dressed sterilely, woke without difficulty and transported to the recovery room in satisfactory condition.  The patient tolerated the procedure well.  No complications.  Assistant, Cleophas Dunker, P.A., was used for assistance to the patient's elevated BMI.  His kilogram weight was 140. Minimal blood loss.     Susa Day, M.D.     JB/MEDQ  D:  12/31/2014  T:  12/31/2014  Job:  [id number]

## 2015-01-04 ENCOUNTER — Other Ambulatory Visit: Payer: Self-pay | Admitting: Internal Medicine

## 2015-01-05 ENCOUNTER — Encounter (HOSPITAL_BASED_OUTPATIENT_CLINIC_OR_DEPARTMENT_OTHER): Payer: Self-pay | Admitting: Specialist

## 2015-01-06 ENCOUNTER — Other Ambulatory Visit: Payer: Self-pay | Admitting: *Deleted

## 2015-01-06 MED ORDER — CANAGLIFLOZIN 300 MG PO TABS: ORAL_TABLET | ORAL | Status: DC

## 2015-01-06 MED ORDER — LANSOPRAZOLE 30 MG PO CPDR: DELAYED_RELEASE_CAPSULE | ORAL | Status: DC

## 2015-01-06 MED ORDER — SAXAGLIPTIN HCL 5 MG PO TABS: ORAL_TABLET | ORAL | Status: DC

## 2015-01-13 ENCOUNTER — Other Ambulatory Visit: Payer: Self-pay | Admitting: Physician Assistant

## 2015-02-06 ENCOUNTER — Encounter: Payer: Self-pay | Admitting: *Deleted

## 2015-02-10 ENCOUNTER — Other Ambulatory Visit: Payer: Self-pay | Admitting: Physician Assistant

## 2015-02-10 ENCOUNTER — Other Ambulatory Visit: Payer: Self-pay | Admitting: Internal Medicine

## 2015-02-12 ENCOUNTER — Ambulatory Visit (INDEPENDENT_AMBULATORY_CARE_PROVIDER_SITE_OTHER): Payer: BLUE CROSS/BLUE SHIELD | Admitting: Internal Medicine

## 2015-02-12 ENCOUNTER — Encounter: Payer: Self-pay | Admitting: Internal Medicine

## 2015-02-12 VITALS — BP 134/70 | HR 68 | Temp 98.2°F | Resp 20 | Ht 74.0 in | Wt 316.0 lb

## 2015-02-12 DIAGNOSIS — I10 Essential (primary) hypertension: Secondary | ICD-10-CM

## 2015-02-12 DIAGNOSIS — E1129 Type 2 diabetes mellitus with other diabetic kidney complication: Secondary | ICD-10-CM

## 2015-02-12 DIAGNOSIS — E785 Hyperlipidemia, unspecified: Secondary | ICD-10-CM

## 2015-02-12 DIAGNOSIS — Z79899 Other long term (current) drug therapy: Secondary | ICD-10-CM | POA: Diagnosis not present

## 2015-02-12 DIAGNOSIS — E1142 Type 2 diabetes mellitus with diabetic polyneuropathy: Secondary | ICD-10-CM

## 2015-02-12 LAB — CBC WITH DIFFERENTIAL/PLATELET
Basophils Absolute: 0.1 10*3/uL (ref 0.0–0.1)
Basophils Relative: 1 % (ref 0–1)
Eosinophils Absolute: 0.1 10*3/uL (ref 0.0–0.7)
Eosinophils Relative: 2 % (ref 0–5)
HCT: 39.8 % (ref 39.0–52.0)
Hemoglobin: 13.8 g/dL (ref 13.0–17.0)
Lymphocytes Relative: 29 % (ref 12–46)
Lymphs Abs: 1.6 10*3/uL (ref 0.7–4.0)
MCH: 29.8 pg (ref 26.0–34.0)
MCHC: 34.7 g/dL (ref 30.0–36.0)
MCV: 86 fL (ref 78.0–100.0)
MPV: 9.2 fL (ref 8.6–12.4)
Monocytes Absolute: 0.4 10*3/uL (ref 0.1–1.0)
Monocytes Relative: 8 % (ref 3–12)
Neutro Abs: 3.2 10*3/uL (ref 1.7–7.7)
Neutrophils Relative %: 60 % (ref 43–77)
Platelets: 260 10*3/uL (ref 150–400)
RBC: 4.63 MIL/uL (ref 4.22–5.81)
RDW: 16.8 % — ABNORMAL HIGH (ref 11.5–15.5)
WBC: 5.4 10*3/uL (ref 4.0–10.5)

## 2015-02-12 LAB — BASIC METABOLIC PANEL WITH GFR
BUN: 16 mg/dL (ref 7–25)
CO2: 21 mmol/L (ref 20–31)
Calcium: 9.2 mg/dL (ref 8.6–10.3)
Chloride: 106 mmol/L (ref 98–110)
Creat: 0.95 mg/dL (ref 0.70–1.25)
GFR, Est African American: >89 mL/min (ref >=60)
GFR, Est Non African American: 86 mL/min (ref >=60)
Glucose, Bld: 119 mg/dL — ABNORMAL HIGH (ref 65–99)
Potassium: 4.5 mmol/L (ref 3.5–5.3)
Sodium: 138 mmol/L (ref 135–146)

## 2015-02-12 LAB — LIPID PANEL
Cholesterol: 135 mg/dL (ref 125–200)
HDL: 21 mg/dL — ABNORMAL LOW (ref >=40)
LDL Cholesterol: 54 mg/dL (ref <130)
Total CHOL/HDL Ratio: 6.4 Ratio — ABNORMAL HIGH (ref <=5.0)
Triglycerides: 298 mg/dL — ABNORMAL HIGH (ref <150)
VLDL: 60 mg/dL — ABNORMAL HIGH (ref <30)

## 2015-02-12 LAB — HEPATIC FUNCTION PANEL
ALT: 34 U/L (ref 9–46)
AST: 25 U/L (ref 10–35)
Albumin: 3.9 g/dL (ref 3.6–5.1)
Alkaline Phosphatase: 66 U/L (ref 40–115)
Bilirubin, Direct: 0.1 mg/dL (ref <=0.2)
Indirect Bilirubin: 0.4 mg/dL (ref 0.2–1.2)
Total Bilirubin: 0.5 mg/dL (ref 0.2–1.2)
Total Protein: 6.5 g/dL (ref 6.1–8.1)

## 2015-02-12 LAB — HEMOGLOBIN A1C
Hgb A1c MFr Bld: 7.2 % — ABNORMAL HIGH (ref <5.7)
Mean Plasma Glucose: 160 mg/dL — ABNORMAL HIGH (ref <117)

## 2015-02-12 LAB — TSH: TSH: 2.35 u[IU]/mL (ref 0.350–4.500)

## 2015-02-12 NOTE — Progress Notes (Signed)
Patient ID: Jason Wyatt, male   DOB: May 26, 1955, 60 y.o.   MRN: [id number]  Assessment and Plan:  Hypertension:  -Continue medication -monitor blood pressure at home. -Continue DASH diet -Reminder to go to the ER if any CP, SOB, nausea, dizziness, severe HA, changes vision/speech, left arm numbness and tingling and jaw pain.  Cholesterol - Continue diet and exercise -Check cholesterol.   Diabetes with diabetic chronic kidney disease -Continue diet and exercise.  -Check A1C  Vitamin D Def -check level -continue medications.   Samples of janumet, vessicare, and invokana given.  Patient reports that he will be going on medicare in February.    Continue diet and meds as discussed. Further disposition pending results of labs. Discussed med's effects and SE's.    HPI 60 y.o. male  presents for 3 month follow up with hypertension, hyperlipidemia, diabetes and vitamin D deficiency.   His blood pressure has been controlled at home, today their BP is BP: 134/70 mmHg.He does workout. He denies chest pain, shortness of breath, dizziness.   He is on cholesterol medication and denies myalgias. His cholesterol is at goal. The cholesterol was:  10/29/2014: Cholesterol 173; HDL 31*; LDL Cholesterol 85; Triglycerides 285*   He has been working on diet and exercise for diabetes with diabetic chronic kidney disease, he is on bASA, he is on ACE/ARB, and denies  foot ulcerations, hyperglycemia, hypoglycemia , increased appetite, nausea, paresthesia of the feet, polydipsia, polyuria, visual disturbances, vomiting and weight loss. Last A1C was: 10/29/2014: Hgb A1c MFr Bld 7.5*   Patient is on Vitamin D supplement. 07/27/2014: Vit D, 25-Hydroxy 60  He reports that he did have some knee surgery on December 18th.  He reports that he has good range of motion and seems to be tolerating the procedure well.   He has been taking mobic once daily.    He is having some trouble with some restless legs.  He would  like to take a higher dose of the carbidopa levidopa for at the nighttime.    Current Medications:  Current Outpatient Prescriptions on File Prior to Visit  Medication Sig Dispense Refill  . acyclovir (ZOVIRAX) 400 MG tablet TAKE 1 BY MOUTH DAILY AS NEEDED FOR COLD SORES 90 tablet 3  . allopurinol (ZYLOPRIM) 300 MG tablet TAKE 1 BY MOUTH AT BEDTIME 90 tablet 3  . aspirin EC 81 MG tablet Take 4 tablets (325 mg total) by mouth daily. 20 tablet 0  . canagliflozin (INVOKANA) 300 MG TABS tablet TAKE 1 BY MOUTH DAILY BEFORE BREAKFAST 90 tablet 0  . carbidopa-levodopa (SINEMET IR) 25-250 MG tablet TAKE 1 BY MOUTH TWICE DAILY FOR RESTLESS LEGS 180 tablet 0  . colchicine 0.6 MG tablet Take 0.6 mg by mouth 2 (two) times daily as needed (gout flare ups).     . DULoxetine (CYMBALTA) 60 MG capsule TAKE 1 BY MOUTH AT BEDTIME 90 capsule 2  . enalapril (VASOTEC) 20 MG tablet TAKE 1 BY MOUTH TWICE DAILY 180 tablet 3  . gabapentin (NEURONTIN) 300 MG capsule TAKE 1 BY MOUTH 3 TIMES DAILY 270 capsule 0  . lansoprazole (PREVACID) 30 MG capsule TAKE 1 BY MOUTH DAILY FOR HEARTBURN 90 capsule 0  . Magnesium 500 MG CAPS Take 500 mg by mouth every evening.     . metFORMIN (GLUCOPHAGE) 1000 MG tablet TAKE 1 BY MOUTH TWICE DAILY WITH A MEAL 180 tablet 2  . Multiple Vitamin (MULTIVITAMIN WITH MINERALS) TABS Take 1 tablet by mouth every morning.     [name]  NASONEX 50 MCG/ACT nasal spray USE 2 SPRAYS IN EACH NOSTRIL DAILY (Patient taking differently: USE 2 SPRAYS IN EACH NOSTRIL DAILY--  does in pm) 17 g 2  . omega-3 acid ethyl esters (LOVAZA) 1 G capsule TAKE 2 BY MOUTH TWICE DAILY 360 capsule 3  . saxagliptin HCl (ONGLYZA) 5 MG TABS tablet TAKE 1 BY MOUTH DAILY 90 tablet 0  . Vitamin D, Ergocalciferol, (DRISDOL) 50000 UNITS CAPS capsule Take 50,000 Units by mouth as directed. Tuesday , Wednesday, Thursday, Sunday     No current facility-administered medications on file prior to visit.   Medical History:  Past Medical  History  Diagnosis Date  . Gout     takes Allopurinol daily and Colchicine if needed  . Urinary frequency     takes Flomax daily  . GERD (gastroesophageal reflux disease)     takes Prevacid daily  . Thyroid nodule     left side  . Chronic back pain     stenosis  . Hypertension   . OSA on CPAP   . Type II diabetes mellitus ([place])   . Peripheral neuropathy ([place])   . History of hiatal hernia   . History of Barrett's esophagus   . History of adenomatous polyp of colon     tubular adenoma's  . Urine incontinence   . Prostate cancer [facility]) urologist-  dr Alinda Money    dx 2013  s/p  radical non-nerve sparing prostatectomy 2014--  Stage pT2cNx,  PSA 3.48,  Gleason 3+3,  vol 55cc/  current PSA  0.01 (Aug 2016)  . Incomplete right bundle branch block (RBBB)   . Hyperlipidemia   . CKD (chronic kidney disease), stage II   . Acute meniscal tear of left knee   . Arthritis      back  . OA (osteoarthritis)     knee  . Complication of anesthesia cervical fusion surgery 06/2010    "woke up next day w/ventilator on" told due to OSA  . Borderline glaucoma   . Wears glasses   . Allergic rhinitis    Allergies:  Allergies  Allergen Reactions  . Flaxseed [Linseed Oil] Rash  . Lyrica [Pregabalin] Itching and Rash  . Niaspan [Niacin Er] Rash     Review of Systems:  Review of Systems  Constitutional: Negative for fever, chills and malaise/fatigue.  HENT: Negative for congestion, ear pain and sore throat.   Eyes: Negative.   Respiratory: Negative for cough, shortness of breath and wheezing.   Cardiovascular: Negative for chest pain, palpitations and leg swelling.  Gastrointestinal: Negative for heartburn, diarrhea, constipation, blood in stool and melena.  Genitourinary: Negative.   Skin: Negative.   Neurological: Negative for dizziness, sensory change, loss of consciousness and headaches.  Psychiatric/Behavioral: Negative for depression. The patient is not nervous/anxious and does not have  insomnia.     Family history- Review and unchanged  Social history- Review and unchanged  Physical Exam: BP 134/70 mmHg  Pulse 68  Temp(Src) 98.2 F (36.8 C) (Temporal)  Resp 20  Ht 6\' 2"  (1.88 m)  Wt 316 lb (143.337 kg)  BMI 40.55 kg/m2 Wt Readings from Last 3 Encounters:  02/12/15 316 lb (143.337 kg)  12/31/14 305 lb (138.347 kg)  10/29/14 308 lb (139.708 kg)   General Appearance: Well nourished well developed, non-toxic appearing, in no apparent distress. Eyes: PERRLA, EOMs, conjunctiva no swelling or erythema ENT/Mouth: Ear canals clear with no erythema, swelling, or discharge.  TMs normal bilaterally, oropharynx clear, moist, with no exudate.  Neck: Supple, thyroid normal, no JVD, no cervical adenopathy.  Respiratory: Respiratory effort normal, breath sounds clear A&P, no wheeze, rhonchi or rales noted.  No retractions, no accessory muscle usage Cardio: RRR with no MRGs. No noted edema.  Abdomen: Soft, + BS.  Non tender, no guarding, rebound, hernias, masses. Musculoskeletal: Full ROM, 5/5 strength, Normal gait Skin: Warm, dry without rashes, lesions, ecchymosis.  Neuro: Awake and oriented X 3, Cranial nerves intact. No cerebellar symptoms.  Psych: normal affect, Insight and Judgment appropriate.    Starlyn Skeans, PA-C 9:14 AM [facility]

## 2015-03-25 ENCOUNTER — Ambulatory Visit (INDEPENDENT_AMBULATORY_CARE_PROVIDER_SITE_OTHER): Payer: Medicare Other | Admitting: Podiatry

## 2015-03-25 ENCOUNTER — Encounter: Payer: Self-pay | Admitting: Podiatry

## 2015-03-25 DIAGNOSIS — B351 Tinea unguium: Secondary | ICD-10-CM

## 2015-03-25 DIAGNOSIS — E1142 Type 2 diabetes mellitus with diabetic polyneuropathy: Secondary | ICD-10-CM | POA: Diagnosis not present

## 2015-03-25 DIAGNOSIS — M79676 Pain in unspecified toe(s): Secondary | ICD-10-CM

## 2015-03-25 DIAGNOSIS — Q828 Other specified congenital malformations of skin: Secondary | ICD-10-CM | POA: Diagnosis not present

## 2015-03-25 MED ORDER — GABAPENTIN 300 MG PO CAPS: ORAL_CAPSULE | ORAL | Status: DC

## 2015-03-26 ENCOUNTER — Other Ambulatory Visit: Payer: Self-pay | Admitting: *Deleted

## 2015-03-26 MED ORDER — CANAGLIFLOZIN 300 MG PO TABS: ORAL_TABLET | ORAL | Status: DC

## 2015-03-26 MED ORDER — COLCHICINE 0.6 MG PO TABS: 0.6000 mg | ORAL_TABLET | Freq: Two times a day (BID) | ORAL | Status: DC | PRN

## 2015-03-26 MED ORDER — LANSOPRAZOLE 30 MG PO CPDR: DELAYED_RELEASE_CAPSULE | ORAL | Status: DC

## 2015-03-26 MED ORDER — SAXAGLIPTIN HCL 5 MG PO TABS: ORAL_TABLET | ORAL | Status: DC

## 2015-03-26 MED ORDER — DULOXETINE HCL 60 MG PO CPEP: ORAL_CAPSULE | ORAL | Status: DC

## 2015-03-26 MED ORDER — METFORMIN HCL 1000 MG PO TABS: ORAL_TABLET | ORAL | Status: DC

## 2015-03-26 MED ORDER — ENALAPRIL MALEATE 20 MG PO TABS: ORAL_TABLET | ORAL | Status: DC

## 2015-03-26 MED ORDER — OMEGA-3-ACID ETHYL ESTERS 1 G PO CAPS: ORAL_CAPSULE | ORAL | Status: DC

## 2015-03-26 MED ORDER — ALLOPURINOL 300 MG PO TABS: ORAL_TABLET | ORAL | Status: DC

## 2015-03-26 MED ORDER — ACYCLOVIR 400 MG PO TABS: ORAL_TABLET | ORAL | Status: DC

## 2015-03-28 NOTE — Progress Notes (Signed)
He presents today with a chief complaint of painful elongated toenails 1 through 5 bilateral.  Objective: R signs are stable alert and oriented 3. Pulses are palpable. His nails are thick yellow dystrophic with mycotic painful palpation as well as painful calluses plantar aspect of the bilateral foot.  Assessment: Pain in limb secondary to onychomycosis 1 through 5 bilateral.  Plan: Debridement of toenails 1 through 5 bilateral and debridement of all calluses.

## 2015-04-01 ENCOUNTER — Telehealth: Payer: Self-pay | Admitting: *Deleted

## 2015-04-01 ENCOUNTER — Other Ambulatory Visit: Payer: Self-pay

## 2015-04-01 MED ORDER — VITAMIN D (ERGOCALCIFEROL) 1.25 MG (50000 UNIT) PO CAPS: 50000.0000 [IU] | ORAL_CAPSULE | ORAL | Status: DC

## 2015-04-01 MED ORDER — CARBIDOPA-LEVODOPA 25-250 MG PO TABS: ORAL_TABLET | ORAL | Status: DC

## 2015-04-01 MED ORDER — LANSOPRAZOLE 30 MG PO CPDR: DELAYED_RELEASE_CAPSULE | ORAL | Status: DC

## 2015-04-01 MED ORDER — GABAPENTIN 300 MG PO CAPS: ORAL_CAPSULE | ORAL | Status: DC

## 2015-04-01 NOTE — Telephone Encounter (Signed)
Pt states the Gabapentin 300mg  needs to go to [facility] they bought out [facility]  Change 03/25/2015 Gabapentin order destination to [facility] and informed pt.

## 2015-04-06 ENCOUNTER — Other Ambulatory Visit: Payer: Self-pay

## 2015-04-06 MED ORDER — CARBIDOPA-LEVODOPA 25-250 MG PO TABS: ORAL_TABLET | ORAL | Status: DC

## 2015-04-06 MED ORDER — MOMETASONE FUROATE 50 MCG/ACT NA SUSP: 2.0000 | Freq: Every day | NASAL | Status: DC

## 2015-04-06 MED ORDER — VITAMIN D (ERGOCALCIFEROL) 1.25 MG (50000 UNIT) PO CAPS: 50000.0000 [IU] | ORAL_CAPSULE | ORAL | Status: DC

## 2015-04-06 MED ORDER — PANTOPRAZOLE SODIUM 40 MG PO TBEC: 40.0000 mg | DELAYED_RELEASE_TABLET | Freq: Every day | ORAL | Status: DC

## 2015-04-06 MED ORDER — GABAPENTIN 300 MG PO CAPS: ORAL_CAPSULE | ORAL | Status: DC

## 2015-04-20 ENCOUNTER — Other Ambulatory Visit: Payer: Self-pay | Admitting: Internal Medicine

## 2015-05-18 ENCOUNTER — Ambulatory Visit: Payer: Medicare Other | Admitting: *Deleted

## 2015-05-18 DIAGNOSIS — E1142 Type 2 diabetes mellitus with diabetic polyneuropathy: Secondary | ICD-10-CM

## 2015-05-18 NOTE — Progress Notes (Signed)
Patient ID: Jason Wyatt, male   DOB: 1955/08/09, 60 y.o.   MRN: [id number] Patient presents to be scanned and measured for diabetic shoes and inserts.

## 2015-05-21 ENCOUNTER — Ambulatory Visit (INDEPENDENT_AMBULATORY_CARE_PROVIDER_SITE_OTHER): Payer: Medicare Other | Admitting: Internal Medicine

## 2015-05-21 ENCOUNTER — Encounter: Payer: Self-pay | Admitting: Internal Medicine

## 2015-05-21 VITALS — BP 126/74 | HR 108 | Temp 98.2°F | Resp 20 | Ht 73.0 in | Wt 315.0 lb

## 2015-05-21 DIAGNOSIS — M1000 Idiopathic gout, unspecified site: Secondary | ICD-10-CM

## 2015-05-21 DIAGNOSIS — E1142 Type 2 diabetes mellitus with diabetic polyneuropathy: Secondary | ICD-10-CM | POA: Diagnosis not present

## 2015-05-21 DIAGNOSIS — S83242A Other tear of medial meniscus, current injury, left knee, initial encounter: Secondary | ICD-10-CM

## 2015-05-21 DIAGNOSIS — M1712 Unilateral primary osteoarthritis, left knee: Secondary | ICD-10-CM

## 2015-05-21 DIAGNOSIS — E349 Endocrine disorder, unspecified: Secondary | ICD-10-CM

## 2015-05-21 DIAGNOSIS — I10 Essential (primary) hypertension: Secondary | ICD-10-CM | POA: Diagnosis not present

## 2015-05-21 DIAGNOSIS — Z1212 Encounter for screening for malignant neoplasm of rectum: Secondary | ICD-10-CM

## 2015-05-21 DIAGNOSIS — E1129 Type 2 diabetes mellitus with other diabetic kidney complication: Secondary | ICD-10-CM | POA: Diagnosis not present

## 2015-05-21 DIAGNOSIS — Z125 Encounter for screening for malignant neoplasm of prostate: Secondary | ICD-10-CM

## 2015-05-21 DIAGNOSIS — N182 Chronic kidney disease, stage 2 (mild): Secondary | ICD-10-CM

## 2015-05-21 DIAGNOSIS — R0602 Shortness of breath: Secondary | ICD-10-CM

## 2015-05-21 DIAGNOSIS — E785 Hyperlipidemia, unspecified: Secondary | ICD-10-CM

## 2015-05-21 DIAGNOSIS — Z0001 Encounter for general adult medical examination with abnormal findings: Secondary | ICD-10-CM

## 2015-05-21 DIAGNOSIS — I70209 Unspecified atherosclerosis of native arteries of extremities, unspecified extremity: Secondary | ICD-10-CM

## 2015-05-21 DIAGNOSIS — R6889 Other general symptoms and signs: Secondary | ICD-10-CM

## 2015-05-21 DIAGNOSIS — G4733 Obstructive sleep apnea (adult) (pediatric): Secondary | ICD-10-CM | POA: Diagnosis not present

## 2015-05-21 DIAGNOSIS — Z9989 Dependence on other enabling machines and devices: Secondary | ICD-10-CM

## 2015-05-21 DIAGNOSIS — G2581 Restless legs syndrome: Secondary | ICD-10-CM | POA: Diagnosis not present

## 2015-05-21 DIAGNOSIS — R296 Repeated falls: Secondary | ICD-10-CM

## 2015-05-21 DIAGNOSIS — Z136 Encounter for screening for cardiovascular disorders: Secondary | ICD-10-CM

## 2015-05-21 DIAGNOSIS — E559 Vitamin D deficiency, unspecified: Secondary | ICD-10-CM

## 2015-05-21 DIAGNOSIS — Z0000 Encounter for general adult medical examination without abnormal findings: Secondary | ICD-10-CM

## 2015-05-21 LAB — LIPID PANEL
Cholesterol: 186 mg/dL (ref 125–200)
HDL: 31 mg/dL — ABNORMAL LOW (ref >=40)
Total CHOL/HDL Ratio: 6 Ratio — ABNORMAL HIGH (ref <=5.0)
Triglycerides: 475 mg/dL — ABNORMAL HIGH (ref <150)

## 2015-05-21 LAB — TSH: TSH: 2.05 mIU/L (ref 0.40–4.50)

## 2015-05-21 LAB — HEPATIC FUNCTION PANEL
ALT: 32 U/L (ref 9–46)
AST: 40 U/L — ABNORMAL HIGH (ref 10–35)
Albumin: 4.4 g/dL (ref 3.6–5.1)
Alkaline Phosphatase: 74 U/L (ref 40–115)
Bilirubin, Direct: 0.1 mg/dL (ref <=0.2)
Indirect Bilirubin: 0.6 mg/dL (ref 0.2–1.2)
Total Bilirubin: 0.7 mg/dL (ref 0.2–1.2)
Total Protein: 7 g/dL (ref 6.1–8.1)

## 2015-05-21 LAB — HEMOGLOBIN A1C
Hgb A1c MFr Bld: 8.2 % — ABNORMAL HIGH (ref <5.7)
Mean Plasma Glucose: 189 mg/dL

## 2015-05-21 LAB — BASIC METABOLIC PANEL WITH GFR
BUN: 17 mg/dL (ref 7–25)
CO2: 19 mmol/L — ABNORMAL LOW (ref 20–31)
Calcium: 9.8 mg/dL (ref 8.6–10.3)
Chloride: 101 mmol/L (ref 98–110)
Creat: 1.02 mg/dL (ref 0.70–1.25)
GFR, Est African American: >89 mL/min (ref >=60)
GFR, Est Non African American: 78 mL/min (ref >=60)
Glucose, Bld: 167 mg/dL — ABNORMAL HIGH (ref 65–99)
Potassium: 4.7 mmol/L (ref 3.5–5.3)
Sodium: 138 mmol/L (ref 135–146)

## 2015-05-21 LAB — CBC WITH DIFFERENTIAL/PLATELET
Basophils Absolute: 94 cells/uL (ref 0–200)
Basophils Relative: 1 %
Eosinophils Absolute: 188 cells/uL (ref 15–500)
Eosinophils Relative: 2 %
HCT: 45.3 % (ref 38.5–50.0)
Hemoglobin: 15.2 g/dL (ref 13.2–17.1)
Lymphocytes Relative: 21 %
Lymphs Abs: 1974 cells/uL (ref 850–3900)
MCH: 28.9 pg (ref 27.0–33.0)
MCHC: 33.6 g/dL (ref 32.0–36.0)
MCV: 86.1 fL (ref 80.0–100.0)
MPV: 9 fL (ref 7.5–12.5)
Monocytes Absolute: 658 cells/uL (ref 200–950)
Monocytes Relative: 7 %
Neutro Abs: 6486 cells/uL (ref 1500–7800)
Neutrophils Relative %: 69 %
Platelets: 287 10*3/uL (ref 140–400)
RBC: 5.26 MIL/uL (ref 4.20–5.80)
RDW: 17.3 % — ABNORMAL HIGH (ref 11.0–15.0)
WBC: 9.4 10*3/uL (ref 3.8–10.8)

## 2015-05-21 LAB — MAGNESIUM: Magnesium: 1.9 mg/dL (ref 1.5–2.5)

## 2015-05-21 MED ORDER — BUPROPION HCL ER (XL) 150 MG PO TB24: 150.0000 mg | ORAL_TABLET | ORAL | Status: DC

## 2015-05-21 NOTE — Patient Instructions (Signed)
Bupropion extended-release tablets (Depression/Mood Disorders)  What is this medicine?  BUPROPION (byoo PROE pee on) is used to treat depression.  This medicine may be used for other purposes; ask your health care provider or pharmacist if you have questions.  What should I tell my health care provider before I take this medicine?  They need to know if you have any of these conditions:  -an eating disorder, such as anorexia or bulimia  -bipolar disorder or psychosis  -diabetes or high blood sugar, treated with medication  -glaucoma  -head injury or brain tumor  -heart disease, previous heart attack, or irregular heart beat  -high blood pressure  -kidney or liver disease  -seizures (convulsions)  -suicidal thoughts or a previous suicide attempt  -Tourette's syndrome  -weight loss  -an unusual or allergic reaction to bupropion, other medicines, foods, dyes, or preservatives  -breast-feeding  -pregnant or trying to become pregnant  How should I use this medicine?  Take this medicine by mouth with a glass of water. Follow the directions on the prescription label. You can take it with or without food. If it upsets your stomach, take it with food. Do not crush, chew, or cut these tablets. This medicine is taken once daily at the same time each day. Do not take your medicine more often than directed. Do not stop taking this medicine suddenly except upon the advice of your doctor. Stopping this medicine too quickly may cause serious side effects or your condition may worsen.  A special MedGuide will be given to you by the pharmacist with each prescription and refill. Be sure to read this information carefully each time.  Talk to your pediatrician regarding the use of this medicine in children. Special care may be needed.  Overdosage: If you think you have taken too much of this medicine contact a poison control center or emergency room at once.  NOTE: This medicine is only for you. Do not share this medicine with  others.  What if I miss a dose?  If you miss a dose, skip the missed dose and take your next tablet at the regular time. Do not take double or extra doses.  What may interact with this medicine?  Do not take this medicine with any of the following medications:  -linezolid  -MAOIs like Azilect, Carbex, Eldepryl, Marplan, Nardil, and Parnate  -methylene blue (injected into a vein)  -other medicines that contain bupropion like Zyban  This medicine may also interact with the following medications:  -alcohol  -certain medicines for anxiety or sleep  -certain medicines for blood pressure like metoprolol, propranolol  -certain medicines for depression or psychotic disturbances  -certain medicines for HIV or AIDS like efavirenz, lopinavir, nelfinavir, ritonavir  -certain medicines for irregular heart beat like propafenone, flecainide  -certain medicines for Parkinson's disease like amantadine, levodopa  -certain medicines for seizures like carbamazepine, phenytoin, phenobarbital  -cimetidine  -clopidogrel  -cyclophosphamide  -furazolidone  -isoniazid  -nicotine  -orphenadrine  -procarbazine  -steroid medicines like prednisone or cortisone  -stimulant medicines for attention disorders, weight loss, or to stay awake  -tamoxifen  -theophylline  -thiotepa  -ticlopidine  -tramadol  -warfarin  This list may not describe all possible interactions. Give your health care provider a list of all the medicines, herbs, non-prescription drugs, or dietary supplements you use. Also tell them if you smoke, drink alcohol, or use illegal drugs. Some items may interact with your medicine.  What should I watch for while using this medicine?    Tell your doctor if your symptoms do not get better or if they get worse. Visit your doctor or health care professional for regular checks on your progress. Because it may take several weeks to see the full effects of this medicine, it is important to continue your treatment as prescribed by your  doctor.  Patients and their families should watch out for new or worsening thoughts of suicide or depression. Also watch out for sudden changes in feelings such as feeling anxious, agitated, panicky, irritable, hostile, aggressive, impulsive, severely restless, overly excited and hyperactive, or not being able to sleep. If this happens, especially at the beginning of treatment or after a change in dose, call your health care professional.  Avoid alcoholic drinks while taking this medicine. Drinking large amounts of alcoholic beverages, using sleeping or anxiety medicines, or quickly stopping the use of these agents while taking this medicine may increase your risk for a seizure.  Do not drive or use heavy machinery until you know how this medicine affects you. This medicine can impair your ability to perform these tasks.  Do not take this medicine close to bedtime. It may prevent you from sleeping.  Your mouth may get dry. Chewing sugarless gum or sucking hard candy, and drinking plenty of water may help. Contact your doctor if the problem does not go away or is severe.  The tablet shell for some brands of this medicine does not dissolve. This is normal. The tablet shell may appear whole in the stool. This is not a cause for concern.  What side effects may I notice from receiving this medicine?  Side effects that you should report to your doctor or health care professional as soon as possible:  -allergic reactions like skin rash, itching or hives, swelling of the face, lips, or tongue  -breathing problems  -changes in vision  -confusion  -fast or irregular heartbeat  -hallucinations  -increased blood pressure  -redness, blistering, peeling or loosening of the skin, including inside the mouth  -seizures  -suicidal thoughts or other mood changes  -unusually weak or tired  -vomiting  Side effects that usually do not require medical attention (report to your doctor or health care professional if they continue or are  bothersome):  -change in sex drive or performance  -constipation  -headache  -loss of appetite  -nausea  -tremors  -weight loss  This list may not describe all possible side effects. Call your doctor for medical advice about side effects. You may report side effects to FDA at 1-800-FDA-1088.  Where should I keep my medicine?  Keep out of the reach of children.  Store at room temperature between 15 and 30 degrees C (59 and 86 degrees F). Throw away any unused medicine after the expiration date.  NOTE: This sheet is a summary. It may not cover all possible information. If you have questions about this medicine, talk to your doctor, pharmacist, or health care provider.     © 2016, [employer]. (2012-07-26 12:39:42)

## 2015-05-21 NOTE — Progress Notes (Signed)
MEDICARE ANNUAL WELLNESS VISIT AND CPE  Assessment:    1. Welcome to Medicare preventive visit  - CBC with Differential/Platelet - BASIC METABOLIC PANEL WITH GFR - Hepatic function panel - Magnesium  2. Essential hypertension  - Urinalysis, Routine w reflex microscopic (not at [facility]) - Microalbumin / creatinine urine ratio - EKG 12-Lead - [place], RETROPERITNL ABD,  LTD - TSH  3. Atherosclerotic peripheral vascular disease ([place]) -cont aggressive medical management of diabetes and hyperlipidemia  4. OSA on CPAP -cont CPAP use  5. Type 2 diabetes mellitus with other diabetic kidney complication, without long-term current use of insulin (HCC) -close to goal -cont meds - Hemoglobin A1c - Insulin, random  6. DM type 2 with diabetic peripheral neuropathy (HCC) -cont meds - Hemoglobin A1c - Insulin, random  7. Primary osteoarthritis of left knee -following with ortho -getting orthovisc injections  8. Acute medial meniscus tear of left knee, initial encounter -surgical management done -resolved  9. CKD (chronic kidney disease), stage II -avoid NSAID use -cont hydration  10. Idiopathic gout, unspecified chronicity, unspecified site -cont allopurinol  11. Morbid obesity, unspecified obesity type ([place]) -recommended increased work with diet  12. Hyperlipidemia -cont medication - Lipid panel  13. RLS -cont sinemet twice daily -can consider increasing to TID  14. Screening for rectal cancer  - POC Hemoccult Bld/Stl (3-Cd Home Screen); Future  15. Screening for prostate cancer  - PSA  16. Testosterone deficiency -not candidate for replacement due to history of prostate cancer - Testosterone  17. Vitamin D deficiency -cont supplement - VITAMIN D 25 Hydroxy (Vit-D Deficiency, Fractures)  18. Frequent falls -medications not likely source, there is bilateral leg weakness and also history of diabetic peripheral neuropathy which could be contributing to weakness  and fall -recommended using at least 4 prong cane vs. Gilford Rile -referral to home health for PT - CT Head Wo Contrast; Future - Ambulatory referral to [place]  19. Shortness of breath -possibly coming from heart -has multiple cardiac risk factors.   -refer to cards for stress testing -if normal can consider PFTs     Over 40 minutes of exam, counseling, chart review and critical decision making was performed  Plan:   During the course of the visit the patient was educated and counseled about appropriate screening and preventive services including:    Pneumococcal vaccine  Prevnar 13   Influenza vaccine  Td vaccine  Screening electrocardiogram  Bone densitometry screening  Colorectal cancer screening  Diabetes screening  Glaucoma screening  Nutrition counseling   Advanced directives: requested  Conditions/risks identified: BMI: Discussed weight loss, diet, and increase physical activity.  Increase physical activity: AHA recommends 150 minutes of physical activity a week.  Medications reviewed Diabetes is not at goal, ACE/ARB therapy: Yes. Urinary Incontinence is not an issue: discussed non pharmacology and pharmacology options.  Fall risk: low- discussed PT, home fall assessment, medications.    Subjective:  Jason Wyatt is a 60 y.o. male who presents for Medicare Annual Wellness Visit and complete physical.  Date of last medicare wellness visit is unknown.  He has had elevated blood pressure for many years which is managed by medication.[name] His blood pressure has been controlled at home, today their BP is BP: 126/74 mmHg He does not workout. He denies chest pain, shortness of breath, dizziness.  He is on cholesterol medication and denies myalgias. His cholesterol is at goal. The cholesterol last visit was:   Lab Results  Component Value Date   CHOL  186 05/21/2015   HDL 31* 05/21/2015   LDLCALC NOT CALC 05/21/2015   TRIG 475* 05/21/2015   CHOLHDL 6.0*  05/21/2015   He has had diabetes which is managed with oral medication at this time.   He has been working on diet and exercise for prediabetes, and denies foot ulcerations, hyperglycemia, hypoglycemia , increased appetite, nausea, paresthesia of the feet, polydipsia, polyuria, visual disturbances, vomiting and weight loss. Last A1C in the office was:  Lab Results  Component Value Date   HGBA1C 8.2* 05/21/2015   Last GFR: Lab Results  Component Value Date   [facility] 78 05/21/2015   Lab Results  Component Value Date   GFRAA >89 05/21/2015   Patient is on Vitamin D supplement.   Lab Results  Component Value Date   VD25OH 44 05/21/2015      He reports that he has been falling a lot lately.  He reports that he feels like he is having a lot of parasthesias in his feet and he also feels like he is unsteady on his feet.  He has been following with Dr. [name] and has also been following along with his back doctor.  They recommend that he start using a cane.    He also reports that he has been getting really short of breath and fatigued very easily.  He has had a cardaic cath in the past and that was normal.  He does have an abnormal ekg with LBBB and 1st degree AV block.  He feels like he can barely make it through the grocery store without feeling like a marathon.    Medication Review: Current Outpatient Prescriptions on File Prior to Visit  Medication Sig Dispense Refill  . acyclovir (ZOVIRAX) 400 MG tablet TAKE 1 BY MOUTH DAILY AS NEEDED FOR COLD SORES 90 tablet 3  . allopurinol (ZYLOPRIM) 300 MG tablet TAKE 1 BY MOUTH AT BEDTIME 90 tablet 3  . aspirin EC 81 MG tablet Take 4 tablets (325 mg total) by mouth daily. 20 tablet 0  . canagliflozin (INVOKANA) 300 MG TABS tablet TAKE 1 BY MOUTH DAILY BEFORE BREAKFAST 90 tablet 3  . carbidopa-levodopa (SINEMET IR) 25-250 MG tablet TAKE 1 BY MOUTH TWICE DAILY FOR RESTLESS LEGS 180 tablet 0  . colchicine 0.6 MG tablet Take 1 tablet (0.6 mg total) by  mouth 2 (two) times daily as needed (gout flare ups). 180 tablet 2  . DULoxetine (CYMBALTA) 60 MG capsule TAKE 1 BY MOUTH AT BEDTIME 90 capsule 2  . enalapril (VASOTEC) 20 MG tablet TAKE 1 BY MOUTH TWICE DAILY 180 tablet 3  . gabapentin (NEURONTIN) 300 MG capsule Take two (2) capsules by mouth three (3) times daily. 180 capsule 3  . Magnesium 500 MG CAPS Take 500 mg by mouth every evening.     . metFORMIN (GLUCOPHAGE) 1000 MG tablet TAKE 1 BY MOUTH TWICE DAILY WITH A MEAL 180 tablet 2  . mometasone (NASONEX) 50 MCG/ACT nasal spray Place 2 sprays into the nose daily. 17 g 2  . Multiple Vitamin (MULTIVITAMIN WITH MINERALS) TABS Take 1 tablet by mouth every morning.     [name] omega-3 acid ethyl esters (LOVAZA) 1 g capsule TAKE 2 BY MOUTH TWICE DAILY 360 capsule 3  . pantoprazole (PROTONIX) 40 MG tablet Take 1 tablet (40 mg total) by mouth daily. 90 tablet 1  . saxagliptin HCl (ONGLYZA) 5 MG TABS tablet TAKE 1 BY MOUTH DAILY 90 tablet 0  . Vitamin D, Ergocalciferol, (DRISDOL) 50000 units CAPS  capsule Take 1 capsule (50,000 Units total) by mouth as directed. Tuesday , Wednesday, Thursday, Sunday 90 capsule 1   No current facility-administered medications on file prior to visit.    Current Problems (verified) Patient Active Problem List   Diagnosis Date Noted  . Osteoarthritis of left knee 12/31/2014  . Acute medial meniscus tear of left knee 12/31/2014  . DM type 2 with diabetic peripheral neuropathy ([place]) 10/07/2014  . RLS 07/26/2014  . Medication management 07/26/2014  . Hyperlipidemia 08/24/2011  . Hypertension 08/24/2011  . T2_NIDDM w/ CKD 2 (GFR 78 ml/min) 08/05/2011  . Gout 08/05/2011  . Morbid obesity (BMI 41+) 08/04/2011  . OSA on CPAP 08/04/2011  . CKD (chronic kidney disease), stage II 08/03/2011  . Atherosclerotic peripheral vascular disease ([place]) 06/26/2011    Screening Tests Immunization History  Administered Date(s) Administered  . Influenza Split 10/17/2013, 10/29/2014  .  Influenza-Unspecified 11/04/2012  . PPD Test 01/16/2006, 04/07/2013  . Pneumococcal Polysaccharide-23 04/07/2013  . Pneumococcal-Unspecified 09/17/2010  . Td 01/17/2011  . Zoster 01/16/2005    Preventative care: Last colonoscopy: 2008  Prior vaccinations: TD or Tdap: 2013  Influenza: 2016  Pneumococcal: 2015 Prevnar13: Declined Shingles/Zostavax: 2007  Names of Other Physician/Practitioners you currently use: 1. [facility] here for primary care 2. Dr. Einar Gip, eye doctor, last visit February 2017 3. Does not see a dentist secondary to cost, dentist, last visit 2014 per patient report Patient Care Team: Unk Pinto, MD as PCP - General (Internal Medicine) [place], DPM as Consulting Physician (Podiatry) Jonetta Speak. Nicki Reaper, MD (Dentistry) Webb Laws, OD as Referring Physician (Optometry) Peter M Martinique, MD as Consulting Physician (Cardiology) Ronald Lobo, MD as Consulting Physician (Gastroenterology) Susa Day, MD as Consulting Physician (Orthopedic Surgery) Rolan Bucco, MD as Consulting Physician (Urology)  Past Surgical History  Procedure Laterality Date  . Posterior fusion cervical spine  07-05-2010    C1 --  C4  . Esophagogastroduodenoscopy  09/26/2011    Procedure: ESOPHAGOGASTRODUODENOSCOPY (EGD);  Surgeon: Cleotis Nipper, MD;  Location: [facility];  Service: Endoscopy;  Laterality: N/A;  . Robot assisted laparoscopic radical prostatectomy  02/07/2012    Procedure: ROBOTIC ASSISTED LAPAROSCOPIC RADICAL PROSTATECTOMY LEVEL 1;  Surgeon: Molli Hazard, MD;  Location: [facility];  Service: Urology;  Laterality: N/A;     . Wisdom teeth extracted   1982  . Left and right heart catheterization with coronary angiogram N/A 08/25/2011    Procedure: LEFT AND RIGHT HEART CATHETERIZATION WITH CORONARY ANGIOGRAM;  Surgeon: Peter M Martinique, MD;  Location: [facility] CATH LAB;  Service: Cardiovascular;  Laterality: N/A;   Normal  coronary arteries and LVF, ef 55-60%  . Anal fissure repair  06-05-2002  . Cardiovascular stress test  01-01-2008    normal nuclear study/  no ischemia/  normal LV function and wall motion , 57%  . Transthoracic echocardiogram  08-03-2011    mild LVH, ef 55-60%/  trivial TR and PR  . Posterior lumbar fusion  05-18-2014    L4 -- S1  . Uvulopalatopharyngoplasty (uppp)/tonsillectomy/septoplasty  1990's    and Adenoidectomy  . Knee arthroscopy with medial menisectomy Left 12/31/2014    Procedure: LEFT KNEE ARTHROSCOPY, PARTIAL MEDIAL AND LATERAL  MENISECTOMIES WITH [place];  Surgeon: Susa Day, MD;  Location: [place];  Service: Orthopedics;  Laterality: Left;   Family History  Problem Relation Age of Onset  . Hypertension Mother   . Dementia Mother   . Osteoporosis Mother   .  Osteoarthritis Mother   . Cancer Father   . Hyperlipidemia Sister   . Hypertension Sister    Social History  Substance Use Topics  . Smoking status: Never Smoker   . Smokeless tobacco: Never Used  . Alcohol Use: No   Allergies  Allergen Reactions  . Flaxseed [Linseed Oil] Rash  . Lyrica [Pregabalin] Itching and Rash  . Niaspan [Niacin Er] Rash    MEDICARE WELLNESS OBJECTIVES: Tobacco use: He does not smoke.  Patient is not a former smoker. If yes, counseling given Alcohol Current alcohol use: none Diet: in general, an "unhealthy" diet Physical activity: Current Exercise Habits: The patient does not participate in regular exercise at present, Exercise limited by: orthopedic condition(s);neurologic condition(s) Cardiac risk factors: Cardiac Risk Factors include: advanced age (>24men, >71 women);diabetes mellitus;dyslipidemia;hypertension;male gender;obesity (BMI >30kg/m2);sedentary lifestyle Depression/mood screen:   Depression screen [facility] 2/9 05/21/2015  Decreased Interest 1  Down, Depressed, Hopeless 1  PHQ - 2 Score 2  Altered sleeping 0  Tired, decreased energy 2  Change in  appetite 3  Feeling bad or failure about yourself  1  Trouble concentrating 0  Moving slowly or fidgety/restless 0  Suicidal thoughts 0  PHQ-9 Score 8  Difficult doing work/chores Somewhat difficult    ADLs:  In your present state of health, do you have any difficulty performing the following activities: 05/21/2015 12/31/2014  Hearing? N N  Vision? N N  Difficulty concentrating or making decisions? Y N  Walking or climbing stairs? Y Y  Dressing or bathing? N N  Doing errands, shopping? N -  Using the Toilet? N -  In the past six months, have you accidently leaked urine? Y -  Do you have problems with loss of bowel control? N -  Managing your Medications? N -  Managing your Finances? N -  Housekeeping or managing your Housekeeping? N -     Cognitive Testing  Alert? Yes  Normal Appearance?Yes  Oriented to person? Yes  Place? Yes   Time? Yes  Recall of three objects?  Yes  Can perform simple calculations? Yes  Displays appropriate judgment?Yes  Can read the correct time from a watch face?Yes  EOL planning: Does patient have an advance directive?: No  Review of Systems  Constitutional: Positive for malaise/fatigue. Negative for fever, chills, weight loss and diaphoresis.  HENT: Negative for congestion, ear pain and sore throat.   Eyes: Negative.   Respiratory: Positive for shortness of breath. Negative for cough, sputum production and wheezing.   Cardiovascular: Negative for chest pain, palpitations, orthopnea, leg swelling and PND.  Gastrointestinal: Negative for heartburn, abdominal pain, diarrhea, constipation, blood in stool and melena.  Genitourinary:       Occasional urinary leakage with stress   Musculoskeletal: Positive for back pain, joint pain and falls. Negative for myalgias and neck pain.  Skin: Negative.   Neurological: Positive for sensory change and weakness. Negative for dizziness, seizures, loss of consciousness and headaches.  Psychiatric/Behavioral:  Positive for depression. The patient is nervous/anxious. The patient does not have insomnia.      Objective:     Today's Vitals   05/21/15 0917  BP: 126/74  Pulse: 108  Temp: 98.2 F (36.8 C)  TempSrc: Temporal  Resp: 20  Height: 6\' 1"  (1.854 m)  Weight: 315 lb (142.883 kg)   Body mass index is 41.57 kg/(m^2). Wt Readings from Last 3 Encounters:  05/21/15 315 lb (142.883 kg)  02/12/15 316 lb (143.337 kg)  12/31/14 305 lb (138.347 kg)  General appearance: alert, no distress, WD/WN, male HEENT: normocephalic, sclerae anicteric, TMs pearly, nares patent, no discharge or erythema, pharynx normal Oral cavity: MMM, no lesions Neck: supple, no lymphadenopathy, no thyromegaly, no masses Heart: RRR, normal S1, S2, no murmurs Lungs: CTA bilaterally, no wheezes, rhonchi, or rales Abdomen: +bs,rotund obese abdomen, soft, non tender, non distended, no masses, no hepatomegaly, no splenomegaly Musculoskeletal: nontender, no swelling, no obvious deformity Extremities: no edema, no cyanosis, no clubbing Pulses: 2+ symmetric, upper and lower extremities, normal cap refill Neurological: alert, oriented x 3, CN2-12 intact, strength normal upper extremities, diminished strength in the lower extremities with knee flexion and plantar flexion, sensation normal throughout with exception of bilateral feet which have diminished sensation to light touch, DTRs 2+ throughout, no cerebellar signs, gait Wide based with a cane and slow Psychiatric: normal affect, behavior normal, pleasant   Medicare Attestation I have personally reviewed: The patient's medical and social history Their use of alcohol, tobacco or illicit drugs Their current medications and supplements The patient's functional ability including ADLs,fall risks, home safety risks, cognitive, and hearing and visual impairment Diet and physical activities Evidence for depression or mood disorders  The patient's weight, height, BMI, and  visual acuity have been recorded in the chart.  I have made referrals, counseling, and provided education to the patient based on review of the above and I have provided the patient with a written personalized care plan for preventive services.     Starlyn Skeans, PA-C   05/22/2015

## 2015-05-22 LAB — MICROALBUMIN / CREATININE URINE RATIO
Creatinine, Urine: 65 mg/dL (ref 20–370)
Microalb Creat Ratio: 9 mcg/mg creat (ref <30)
Microalb, Ur: 0.6 mg/dL

## 2015-05-22 LAB — URINALYSIS, ROUTINE W REFLEX MICROSCOPIC
Bilirubin Urine: NEGATIVE
Hgb urine dipstick: NEGATIVE
Ketones, ur: NEGATIVE
Leukocytes, UA: NEGATIVE
Nitrite: NEGATIVE
Protein, ur: NEGATIVE
Specific Gravity, Urine: 1.03 (ref 1.001–1.035)
pH: 6.5 (ref 5.0–8.0)

## 2015-05-22 LAB — TESTOSTERONE: Testosterone: 151 ng/dL — ABNORMAL LOW (ref 250–827)

## 2015-05-22 LAB — URINALYSIS, MICROSCOPIC ONLY
Bacteria, UA: NONE SEEN [HPF]
Casts: NONE SEEN [LPF]
Crystals: NONE SEEN [HPF]
RBC / HPF: NONE SEEN RBC/HPF (ref <=2)
Squamous Epithelial / LPF: NONE SEEN [HPF] (ref <=5)
WBC, UA: NONE SEEN WBC/HPF (ref <=5)
Yeast: NONE SEEN [HPF]

## 2015-05-22 LAB — INSULIN, RANDOM: Insulin: 49 u[IU]/mL — ABNORMAL HIGH (ref 2.0–19.6)

## 2015-05-22 LAB — VITAMIN D 25 HYDROXY (VIT D DEFICIENCY, FRACTURES): Vit D, 25-Hydroxy: 52 ng/mL (ref 30–100)

## 2015-05-22 LAB — PSA: PSA: <0.01 ng/mL (ref <=4.00)

## 2015-05-25 ENCOUNTER — Ambulatory Visit
Admission: RE | Admit: 2015-05-25 | Discharge: 2015-05-25 | Disposition: A | Discharge status: Home / Self Care | Payer: Medicare Other | Source: Ambulatory Visit | Attending: Internal Medicine | Admitting: Internal Medicine

## 2015-05-25 DIAGNOSIS — R296 Repeated falls: Secondary | ICD-10-CM

## 2015-05-28 DIAGNOSIS — E1142 Type 2 diabetes mellitus with diabetic polyneuropathy: Secondary | ICD-10-CM | POA: Diagnosis not present

## 2015-05-28 DIAGNOSIS — M1712 Unilateral primary osteoarthritis, left knee: Secondary | ICD-10-CM | POA: Diagnosis not present

## 2015-05-28 DIAGNOSIS — I129 Hypertensive chronic kidney disease with stage 1 through stage 4 chronic kidney disease, or unspecified chronic kidney disease: Secondary | ICD-10-CM | POA: Diagnosis not present

## 2015-05-28 DIAGNOSIS — E1151 Type 2 diabetes mellitus with diabetic peripheral angiopathy without gangrene: Secondary | ICD-10-CM | POA: Diagnosis not present

## 2015-05-28 DIAGNOSIS — R296 Repeated falls: Secondary | ICD-10-CM | POA: Diagnosis not present

## 2015-05-28 DIAGNOSIS — E1129 Type 2 diabetes mellitus with other diabetic kidney complication: Secondary | ICD-10-CM | POA: Diagnosis not present

## 2015-06-02 ENCOUNTER — Telehealth: Payer: Self-pay | Admitting: *Deleted

## 2015-06-02 NOTE — Telephone Encounter (Signed)
Patient called with concerns about Wellbutrin Rx causing him to "feel funny".  Patient stopped Rx and feels better now.  Advised him, per Loma Sousa Forcucci's orders, to stay off Rx and call for ov for further E/T if any concerns arise. Patient expressed understanding.

## 2015-06-24 ENCOUNTER — Encounter: Payer: Self-pay | Admitting: Podiatry

## 2015-06-24 ENCOUNTER — Ambulatory Visit (INDEPENDENT_AMBULATORY_CARE_PROVIDER_SITE_OTHER): Payer: Medicare Other | Admitting: Podiatry

## 2015-06-24 DIAGNOSIS — B351 Tinea unguium: Secondary | ICD-10-CM | POA: Diagnosis not present

## 2015-06-24 DIAGNOSIS — M2010 Hallux valgus (acquired), unspecified foot: Secondary | ICD-10-CM

## 2015-06-24 DIAGNOSIS — E1142 Type 2 diabetes mellitus with diabetic polyneuropathy: Secondary | ICD-10-CM | POA: Diagnosis not present

## 2015-06-24 DIAGNOSIS — M2011 Hallux valgus (acquired), right foot: Secondary | ICD-10-CM

## 2015-06-24 DIAGNOSIS — M2012 Hallux valgus (acquired), left foot: Secondary | ICD-10-CM | POA: Diagnosis not present

## 2015-06-24 DIAGNOSIS — M79676 Pain in unspecified toe(s): Secondary | ICD-10-CM

## 2015-06-24 DIAGNOSIS — Q828 Other specified congenital malformations of skin: Secondary | ICD-10-CM

## 2015-06-24 NOTE — Progress Notes (Addendum)
Patient ID: DEJOUN KARAU, male   DOB: [birth year], 60 y.o.   MRN: [id number] Patient presents for diabetic shoe pick up, shoes are tried on for good fit.  Patient received 1 Pair Apex [id number] Bolt fit lite collection lime green in men's 15 extra wide and 3 pairs custom molded diabetic inserts.  Verbal and written break in and wear instructions given.  Patient will follow up for scheduled routine care.     He presents today to not only pick up his diabetic shoes but also to have his nails debrided. He has a history of diabetes with diabetic peripheral neuropathy and hammertoe deformities. Diabetic angiopathy is also noted. His toenails are thick yellow dystrophic onychomycotic and painful palpation. He is also complaining of instability and is now using a 4-point cane.  Assessment: Pain in limb secondary onychomycosis and diabetes. Instability abnormal gait.  Plan: Distributed his diabetic shoes for him today and debrided his nails 1 through 5 bilateral. Follow up with him in 3 months. We will have him back in 1 month to be casted for a more balanced brace.

## 2015-06-24 NOTE — Patient Instructions (Signed)

## 2015-06-29 ENCOUNTER — Other Ambulatory Visit: Payer: Self-pay | Admitting: Internal Medicine

## 2015-06-30 ENCOUNTER — Other Ambulatory Visit: Payer: Self-pay | Admitting: *Deleted

## 2015-06-30 ENCOUNTER — Other Ambulatory Visit: Payer: Self-pay

## 2015-06-30 ENCOUNTER — Telehealth: Payer: Self-pay | Admitting: *Deleted

## 2015-06-30 MED ORDER — GABAPENTIN 300 MG PO CAPS
ORAL_CAPSULE | ORAL | Status: DC
Start: 2015-06-30 — End: 2016-01-27

## 2015-06-30 MED ORDER — CARBIDOPA-LEVODOPA 25-250 MG PO TABS: ORAL_TABLET | ORAL | Status: DC

## 2015-06-30 MED ORDER — SAXAGLIPTIN HCL 5 MG PO TABS: ORAL_TABLET | ORAL | Status: DC

## 2015-06-30 NOTE — Telephone Encounter (Signed)
Received fax refill request for Gabapentin 300mg  #540 ii po q tid.  Dr. [name] states refill +3additional.  Done.

## 2015-07-01 ENCOUNTER — Other Ambulatory Visit: Payer: Self-pay | Admitting: *Deleted

## 2015-07-01 ENCOUNTER — Other Ambulatory Visit: Payer: Self-pay | Admitting: Internal Medicine

## 2015-07-01 DIAGNOSIS — I2510 Atherosclerotic heart disease of native coronary artery without angina pectoris: Secondary | ICD-10-CM

## 2015-07-01 MED ORDER — SAXAGLIPTIN HCL 5 MG PO TABS: ORAL_TABLET | ORAL | Status: DC

## 2015-07-08 ENCOUNTER — Ambulatory Visit (INDEPENDENT_AMBULATORY_CARE_PROVIDER_SITE_OTHER): Payer: Medicare Other | Admitting: Cardiovascular Disease

## 2015-07-08 ENCOUNTER — Encounter: Payer: Self-pay | Admitting: Cardiovascular Disease

## 2015-07-08 ENCOUNTER — Encounter: Payer: Self-pay | Admitting: *Deleted

## 2015-07-08 VITALS — BP 116/82 | HR 74 | Ht 75.0 in | Wt 313.4 lb

## 2015-07-08 DIAGNOSIS — I10 Essential (primary) hypertension: Secondary | ICD-10-CM | POA: Diagnosis not present

## 2015-07-08 DIAGNOSIS — Z0389 Encounter for observation for other suspected diseases and conditions ruled out: Secondary | ICD-10-CM | POA: Diagnosis not present

## 2015-07-08 DIAGNOSIS — E785 Hyperlipidemia, unspecified: Secondary | ICD-10-CM | POA: Diagnosis not present

## 2015-07-08 DIAGNOSIS — IMO0001 Reserved for inherently not codable concepts without codable children: Secondary | ICD-10-CM | POA: Insufficient documentation

## 2015-07-08 NOTE — Patient Instructions (Addendum)
Medication Instructions:  Your physician recommends that you continue on your current medications as directed. Please refer to the Current Medication list given to you today.   Labwork: none  Testing/Procedures: none  Follow-Up: Your physician recommends that you schedule a follow-up ON AN AS NEEDED BASIS.  Dr Gwenlyn Found has cleared you at low cardiovascular risk for your upcoming knee surgery. A clearance will be faxed to your doctor.   Any Other Special Instructions Will Be Listed Below (If Applicable).     If you need a refill on your cardiac medications before your next appointment, please call your pharmacy.

## 2015-07-08 NOTE — Assessment & Plan Note (Signed)
History of hyperlipidemia on Lovaza followed by his PCP

## 2015-07-08 NOTE — Progress Notes (Signed)
07/08/2015 CULLEY PLANO   09-Nov-1955  [id number]  Primary Physician MCKEOWN,WILLIAM DAVID, MD Primary Cardiologist: Lorretta Harp MD Renae Gloss  HPI:  Jason Wyatt is a 60 year old moderately overweight married Caucasian male father of 2 children who is accompanied by his son Jason Wyatt today. He has a history of obesity, sleep apnea, treated hypertension and hyperlipidemia. He was referred for cardiovascular clearance for a elective left total knee replacement. He had a cardiac catheterization performed radially by Dr. Martinique 08/25/11 which was completely normal. He denies any cardiovascular symptoms.   Current Outpatient Prescriptions  Medication Sig Dispense Refill  . acyclovir (ZOVIRAX) 400 MG tablet TAKE 1 BY MOUTH DAILY AS NEEDED FOR COLD SORES 90 tablet 3  . allopurinol (ZYLOPRIM) 300 MG tablet TAKE 1 BY MOUTH AT BEDTIME 90 tablet 3  . aspirin EC 81 MG tablet Take 4 tablets (325 mg total) by mouth daily. 20 tablet 0  . canagliflozin (INVOKANA) 300 MG TABS tablet TAKE 1 BY MOUTH DAILY BEFORE BREAKFAST 90 tablet 3  . carbidopa-levodopa (SINEMET IR) 25-250 MG tablet TAKE 1 BY MOUTH TWICE DAILY FOR RESTLESS LEGS 180 tablet 0  . colchicine 0.6 MG tablet Take 1 tablet (0.6 mg total) by mouth 2 (two) times daily as needed (gout flare ups). 180 tablet 2  . DULoxetine (CYMBALTA) 60 MG capsule TAKE 1 BY MOUTH AT BEDTIME 90 capsule 2  . enalapril (VASOTEC) 20 MG tablet TAKE 1 BY MOUTH TWICE DAILY 180 tablet 3  . fexofenadine (ALLEGRA) 180 MG tablet Take 180 mg by mouth daily. AS NEEDED BASIS FOR SINUS    . gabapentin (NEURONTIN) 300 MG capsule Take two (2) capsules by mouth three (3) times daily. 540 capsule 3  . Magnesium 500 MG CAPS Take 500 mg by mouth every evening.     . meloxicam (MOBIC) 15 MG tablet Take 15 mg by mouth daily as needed for pain.    . metFORMIN (GLUCOPHAGE) 1000 MG tablet TAKE 1 BY MOUTH TWICE DAILY WITH A MEAL 180 tablet 2  . mometasone (NASONEX) 50 MCG/ACT  nasal spray Place 2 sprays into the nose daily. 17 g 2  . Multiple Vitamin (MULTIVITAMIN WITH MINERALS) TABS Take 1 tablet by mouth every morning.     [name] omega-3 acid ethyl esters (LOVAZA) 1 g capsule TAKE 2 BY MOUTH TWICE DAILY 360 capsule 3  . pantoprazole (PROTONIX) 40 MG tablet Take 1 tablet (40 mg total) by mouth daily. 90 tablet 1  . saxagliptin HCl (ONGLYZA) 5 MG TABS tablet TAKE 1 BY MOUTH DAILY 14 tablet 0  . Vitamin D, Ergocalciferol, (DRISDOL) 50000 units CAPS capsule Take 1 capsule (50,000 Units total) by mouth as directed. Tuesday , Wednesday, Thursday, Sunday 90 capsule 1  . buPROPion (WELLBUTRIN XL) 150 MG 24 hr tablet Take 1 tablet (150 mg total) by mouth every morning. (Patient not taking: Reported on 07/08/2015) 30 tablet 2   No current facility-administered medications for this visit.    Allergies  Allergen Reactions  . Flaxseed [Linseed Oil] Rash  . Lyrica [Pregabalin] Itching and Rash  . Niaspan [Niacin Er] Rash    Social History   Social History  . Marital Status: Married    Spouse Name: N/A  . Number of Children: N/A  . Years of Education: N/A   Occupational History  . Not on file.   Social History Main Topics  . Smoking status: Never Smoker   . Smokeless tobacco: Never Used  . Alcohol  Use: No  . Drug Use: No  . Sexual Activity: Not on file   Other Topics Concern  . Not on file   Social History Narrative     Review of Systems: General: negative for chills, fever, night sweats or weight changes.  Cardiovascular: negative for chest pain, dyspnea on exertion, edema, orthopnea, palpitations, paroxysmal nocturnal dyspnea or shortness of breath Dermatological: negative for rash Respiratory: negative for cough or wheezing Urologic: negative for hematuria Abdominal: negative for nausea, vomiting, diarrhea, bright red blood per rectum, melena, or hematemesis Neurologic: negative for visual changes, syncope, or dizziness All other systems reviewed and are  otherwise negative except as noted above.    Blood pressure 116/82, pulse 74, height 6\' 3"  (1.905 m), weight 313 lb 6.4 oz (142.157 kg).  General appearance: alert and no distress Neck: no adenopathy, no carotid bruit, no JVD, supple, symmetrical, trachea midline and thyroid not enlarged, symmetric, no tenderness/mass/nodules Lungs: clear to auscultation bilaterally Heart: regular rate and rhythm, S1, S2 normal, no murmur, click, rub or gallop Extremities: extremities normal, atraumatic, no cyanosis or edema  EKG not performed today  ASSESSMENT AND PLAN:   Hyperlipidemia History of hyperlipidemia on Lovaza followed by his PCP  Hypertension History of hypertension blood pressure measured at 116/82. He is on enalapril. Continue current meds at current dosing  Normal coronary arteries History of normal coronary arteries by cardiac catheterization performed by Dr. Martinique 08/25/11. The patient has been asymptomatic since. Based on this, he is clear to low risk for his upcoming left total knee replacement.      Lorretta Harp MD FACP,FACC,FAHA, [facility] 07/08/2015 9:54 AM

## 2015-07-08 NOTE — Assessment & Plan Note (Signed)
History of normal coronary arteries by cardiac catheterization performed by Dr. Martinique 08/25/11. The patient has been asymptomatic since. Based on this, he is clear to low risk for his upcoming left total knee replacement.

## 2015-07-08 NOTE — Assessment & Plan Note (Signed)
History of hypertension blood pressure measured at 116/82. He is on enalapril. Continue current meds at current dosing

## 2015-07-14 ENCOUNTER — Ambulatory Visit: Payer: Self-pay | Admitting: Orthopedic Surgery

## 2015-07-27 NOTE — Patient Instructions (Addendum)
Wane TEDFORD MCGOWEN  07/27/2015   Your procedure is scheduled on: Thursday 08/05/2015  Report to [facility] Main  Entrance take [place]  elevators to 3rd floor to  [place] at   [place]  AM.  Call this number if you have problems the morning of surgery [phone]   Remember: ONLY 1 PERSON MAY GO WITH YOU TO SHORT STAY TO GET  READY MORNING OF [place].   Do not eat food or drink liquids :After Midnight.   PLEASE BRING YOUR CPAP MASK AND TUBING WITH YOU TO HOSPITAL MORNING OF SURGERY!   Take these medicines the morning of surgery with A SIP OF WATER:  Carbidopa-Levodopa (Sinemet IR), Gabapentin, Pantoprazole              DO NOT TAKE ANY DIABETIC MEDICATIONS DAY OF YOUR SURGERY!                               You may not have any metal on your body including hair pins and              piercings  Do not wear jewelry, make-up, lotions, powders or perfumes, deodorant             Do not wear nail polish.  Do not shave  48 hours prior to surgery.              Men may shave face and neck.   Do not bring valuables to the hospital. [place].  Contacts, dentures or bridgework may not be worn into surgery.  Leave suitcase in the car. After surgery it may be brought to your room.                  Please read over the following fact sheets you were given: _____________________________________________________________________             [facility] - Preparing for Surgery Before surgery, you can play an important role.  Because skin is not sterile, your skin needs to be as free of germs as possible.  You can reduce the number of germs on your skin by washing with CHG (chlorahexidine gluconate) soap before surgery.  CHG is an antiseptic cleaner which kills germs and bonds with the skin to continue killing germs even after washing. Please DO NOT use if you have an allergy to CHG or antibacterial soaps.  If your skin  becomes reddened/irritated stop using the CHG and inform your nurse when you arrive at Short Stay. Do not shave (including legs and underarms) for at least 48 hours prior to the first CHG shower.  You may shave your face/neck. Please follow these instructions carefully:  1.  Shower with CHG Soap the night before surgery and the  morning of Surgery.  2.  If you choose to wash your hair, wash your hair first as usual with your  normal  shampoo.  3.  After you shampoo, rinse your hair and body thoroughly to remove the  shampoo.                           4.  Use CHG as you would any other liquid soap.  You  can apply chg directly  to the skin and wash                       Gently with a scrungie or clean washcloth.  5.  Apply the CHG Soap to your body ONLY FROM THE NECK DOWN.   Do not use on face/ open                           Wound or open sores. Avoid contact with eyes, ears mouth and genitals (private parts).                       Wash face,  Genitals (private parts) with your normal soap.             6.  Wash thoroughly, paying special attention to the area where your surgery  will be performed.  7.  Thoroughly rinse your body with warm water from the neck down.  8.  DO NOT shower/wash with your normal soap after using and rinsing off  the CHG Soap.                9.  Pat yourself dry with a clean towel.            10.  Wear clean pajamas.            11.  Place clean sheets on your bed the night of your first shower and do not  sleep with pets. Day of Surgery : Do not apply any lotions/deodorants the morning of surgery.  Please wear clean clothes to the hospital/surgery center.  FAILURE TO FOLLOW THESE INSTRUCTIONS MAY RESULT IN THE CANCELLATION OF YOUR SURGERY PATIENT SIGNATURE_________________________________  NURSE SIGNATURE__________________________________  ________________________________________________________________________   Adam Phenix  An incentive spirometer is a  tool that can help keep your lungs clear and active. This tool measures how well you are filling your lungs with each breath. Taking long deep breaths may help reverse or decrease the chance of developing breathing (pulmonary) problems (especially infection) following:  A long period of time when you are unable to move or be active. BEFORE THE PROCEDURE   If the spirometer includes an indicator to show your best effort, your nurse or respiratory therapist will set it to a desired goal.  If possible, sit up straight or lean slightly forward. Try not to slouch.  Hold the incentive spirometer in an upright position. INSTRUCTIONS FOR USE   Sit on the edge of your bed if possible, or sit up as far as you can in bed or on a chair.  Hold the incentive spirometer in an upright position.  Breathe out normally.  Place the mouthpiece in your mouth and seal your lips tightly around it.  Breathe in slowly and as deeply as possible, raising the piston or the ball toward the top of the column.  Hold your breath for 3-5 seconds or for as long as possible. Allow the piston or ball to fall to the bottom of the column.  Remove the mouthpiece from your mouth and breathe out normally.  Rest for a few seconds and repeat Steps 1 through 7 at least 10 times every 1-2 hours when you are awake. Take your time and take a few normal breaths between deep breaths.  The spirometer may include an indicator to show your best effort. Use the indicator as a goal to work toward during  each repetition.  After each set of 10 deep breaths, practice coughing to be sure your lungs are clear. If you have an incision (the cut made at the time of surgery), support your incision when coughing by placing a pillow or rolled up towels firmly against it. Once you are able to get out of bed, walk around indoors and cough well. You may stop using the incentive spirometer when instructed by your caregiver.  RISKS AND  COMPLICATIONS  Take your time so you do not get dizzy or light-headed.  If you are in pain, you may need to take or ask for pain medication before doing incentive spirometry. It is harder to take a deep breath if you are having pain. AFTER USE  Rest and breathe slowly and easily.  It can be helpful to keep track of a log of your progress. Your caregiver can provide you with a simple table to help with this. If you are using the spirometer at home, follow these instructions: [place] IF:   You are having difficultly using the spirometer.  You have trouble using the spirometer as often as instructed.  Your pain medication is not giving enough relief while using the spirometer.  You develop fever of 100.5 F (38.1 C) or higher. SEEK IMMEDIATE MEDICAL CARE IF:   You cough up bloody sputum that had not been present before.  You develop fever of 102 F (38.9 C) or greater.  You develop worsening pain at or near the incision site. MAKE SURE YOU:   Understand these instructions.  Will watch your condition.  Will get help right away if you are not doing well or get worse. Document Released: 05/15/2006 Document Revised: 03/27/2011 Document Reviewed: 07/16/2006 ExitCare Patient Information 2014 ExitCare, [place].   ________________________________________________________________________  WHAT IS A BLOOD TRANSFUSION? Blood Transfusion Information  A transfusion is the replacement of blood or some of its parts. Blood is made up of multiple cells which provide different functions.  Red blood cells carry oxygen and are used for blood loss replacement.  White blood cells fight against infection.  Platelets control bleeding.  Plasma helps clot blood.  Other blood products are available for specialized needs, such as hemophilia or other clotting disorders. BEFORE THE TRANSFUSION  Who gives blood for transfusions?   Healthy volunteers who are fully evaluated to make sure  their blood is safe. This is blood bank blood. Transfusion therapy is the safest it has ever been in the practice of medicine. Before blood is taken from a donor, a complete history is taken to make sure that person has no history of diseases nor engages in risky social behavior (examples are intravenous drug use or sexual activity with multiple partners). The donor's travel history is screened to minimize risk of transmitting infections, such as malaria. The donated blood is tested for signs of infectious diseases, such as HIV and hepatitis. The blood is then tested to be sure it is compatible with you in order to minimize the chance of a transfusion reaction. If you or a relative donates blood, this is often done in anticipation of surgery and is not appropriate for emergency situations. It takes many days to process the donated blood. RISKS AND COMPLICATIONS Although transfusion therapy is very safe and saves many lives, the main dangers of transfusion include:   Getting an infectious disease.  Developing a transfusion reaction. This is an allergic reaction to something in the blood you were given. Every precaution is taken to prevent  this. The decision to have a blood transfusion has been considered carefully by your caregiver before blood is given. Blood is not given unless the benefits outweigh the risks. AFTER THE TRANSFUSION  Right after receiving a blood transfusion, you will usually feel much better and more energetic. This is especially true if your red blood cells have gotten low (anemic). The transfusion raises the level of the red blood cells which carry oxygen, and this usually causes an energy increase.  The nurse administering the transfusion will monitor you carefully for complications. HOME CARE INSTRUCTIONS  No special instructions are needed after a transfusion. You may find your energy is better. Speak with your caregiver about any limitations on activity for underlying diseases  you may have. SEEK MEDICAL CARE IF:   Your condition is not improving after your transfusion.  You develop redness or irritation at the intravenous (IV) site. SEEK IMMEDIATE MEDICAL CARE IF:  Any of the following symptoms occur over the next 12 hours:  Shaking chills.  You have a temperature by mouth above 102 F (38.9 C), not controlled by medicine.  Chest, back, or muscle pain.  People around you feel you are not acting correctly or are confused.  Shortness of breath or difficulty breathing.  Dizziness and fainting.  You get a rash or develop hives.  You have a decrease in urine output.  Your urine turns a dark color or changes to pink, red, or brown. Any of the following symptoms occur over the next 10 days:  You have a temperature by mouth above 102 F (38.9 C), not controlled by medicine.  Shortness of breath.  Weakness after normal activity.  The white part of the eye turns yellow (jaundice).  You have a decrease in the amount of urine or are urinating less often.  Your urine turns a dark color or changes to pink, red, or brown. Document Released: 12/31/1999 Document Revised: 03/27/2011 Document Reviewed: 08/19/2007 [facility] Patient Information 2014 [place], [place].  _______________________________________________________________________

## 2015-07-28 ENCOUNTER — Encounter (HOSPITAL_COMMUNITY)
Admission: RE | Admit: 2015-07-28 | Discharge: 2015-07-28 | Disposition: A | Discharge status: Home / Self Care | Payer: Medicare Other | Source: Ambulatory Visit | Attending: Specialist | Admitting: Specialist

## 2015-07-28 ENCOUNTER — Encounter (HOSPITAL_COMMUNITY): Payer: Self-pay

## 2015-07-28 DIAGNOSIS — Z01812 Encounter for preprocedural laboratory examination: Secondary | ICD-10-CM | POA: Insufficient documentation

## 2015-07-28 LAB — BASIC METABOLIC PANEL
Anion gap: 10 (ref 5–15)
BUN: 16 mg/dL (ref 6–20)
CO2: 23 mmol/L (ref 22–32)
Calcium: 9.5 mg/dL (ref 8.9–10.3)
Chloride: 102 mmol/L (ref 101–111)
Creatinine, Ser: 1.05 mg/dL (ref 0.61–1.24)
GFR calc Af Amer: >60 mL/min (ref >60)
GFR calc non Af Amer: >60 mL/min (ref >60)
Glucose, Bld: 139 mg/dL — ABNORMAL HIGH (ref 65–99)
Potassium: 4.6 mmol/L (ref 3.5–5.1)
Sodium: 135 mmol/L (ref 135–145)

## 2015-07-28 LAB — URINE MICROSCOPIC-ADD ON
Bacteria, UA: NONE SEEN
RBC / HPF: NONE SEEN RBC/hpf (ref 0–5)
Squamous Epithelial / LPF: NONE SEEN
WBC, UA: NONE SEEN WBC/hpf (ref 0–5)

## 2015-07-28 LAB — URINALYSIS, ROUTINE W REFLEX MICROSCOPIC
Bilirubin Urine: NEGATIVE
Glucose, UA: >1000 mg/dL — AB
Hgb urine dipstick: NEGATIVE
Ketones, ur: NEGATIVE mg/dL
Leukocytes, UA: NEGATIVE
Nitrite: NEGATIVE
Protein, ur: NEGATIVE mg/dL
Specific Gravity, Urine: 1.029 (ref 1.005–1.030)
pH: 5.5 (ref 5.0–8.0)

## 2015-07-28 LAB — SURGICAL PCR SCREEN
MRSA, PCR: NEGATIVE
Staphylococcus aureus: NEGATIVE

## 2015-07-28 LAB — CBC
HCT: 43.8 % (ref 39.0–52.0)
Hemoglobin: 14.3 g/dL (ref 13.0–17.0)
MCH: 28.5 pg (ref 26.0–34.0)
MCHC: 32.6 g/dL (ref 30.0–36.0)
MCV: 87.4 fL (ref 78.0–100.0)
Platelets: 263 10*3/uL (ref 150–400)
RBC: 5.01 MIL/uL (ref 4.22–5.81)
RDW: 16.6 % — ABNORMAL HIGH (ref 11.5–15.5)
WBC: 8.4 10*3/uL (ref 4.0–10.5)

## 2015-07-28 LAB — APTT: aPTT: 34 seconds (ref 24–37)

## 2015-07-28 LAB — PROTIME-INR
INR: 1.06 (ref 0.00–1.49)
Prothrombin Time: 13.6 seconds (ref 11.6–15.2)

## 2015-07-28 NOTE — Progress Notes (Signed)
Consulted Anesthesia , Dr. Landry Dyke and Dr. Marcell Barlow about EKG from 05/21/15 - patient is OK for surgery.

## 2015-07-28 NOTE — Progress Notes (Signed)
07/08/2015- Pre-operative clearance from Dr. Quay Burow on chart .05/21/15-Pre-operative clearance from Dr. Unk Pinto on chart.

## 2015-07-29 LAB — HEMOGLOBIN A1C
Hgb A1c MFr Bld: 7.4 % — ABNORMAL HIGH (ref 4.8–5.6)
Mean Plasma Glucose: 166 mg/dL

## 2015-08-02 ENCOUNTER — Ambulatory Visit: Payer: Self-pay | Admitting: Orthopedic Surgery

## 2015-08-02 NOTE — H&P (Signed)
Jason Wyatt DOB: [birth year] Married / Language: English / Race: White Male  H&P Date: 08/02/15  Chief Complaint: Left knee pain  History of Present Illness  The patient is a 60 year old male who comes in today for a preoperative History and Physical. The patient is scheduled for a left total knee arthroplasty to be performed by Dr. Johnn Hai, MD at [facility] on 08/05/2015. Urias reports chronic bilateral knee pain, left worse than right, for many years. Prior hx of arthroscopy. Pain is refractory to activity modifications, relative rest, quad strengthening, assistive devices, medications, steroid injections and viscosupplementation.  Dr. Tonita Cong and the patient mutually agreed to proceed with a Left total knee replacement. Risks and benefits of the procedure were discussed including stiffness, suboptimal range of motion, persistent pain, infection requiring removal of prosthesis and reinsertion, need for prophylactic antibiotics in the future, for example, dental procedures, possible need for manipulation, revision in the future and also anesthetic complications including DVT, PE, etc. We discussed the perioperative course, time in the hospital, postoperative recovery and the need for elevation to control swelling. We also discussed the predicted range of motion and the probability that squatting and kneeling would be unobtainable in the future. In addition, postoperative anticoagulation was discussed. We have obtained preoperative medical clearance as necessary- Dr. Melford Aase and Dr. Martinique. Provided her illustrated handout and discussed it in detail. They will enroll in the total joint replacement educational forum at the hospital.  Problem List/Past Medical Hx Unilateral primary osteoarthritis, right knee (M17.11) Hypercholesterolemia  High blood pressure  Sleep Apnea  uses CPAP Diabetes Mellitus, Type II  Gastroesophageal Reflux Disease  Urinary Incontinence    Allergies No Known Drug Allergies [12/30/2010]: Allergies Reconciled   Family History  Congestive Heart Failure  mother Diabetes Mellitus  sister Depression  child Cancer  father Cerebrovascular Accident  mother and grandmother mothers side Heart Disease  grandmother mothers side Osteoporosis  mother and grandmother mothers side Kidney disease  child Hypertension  mother and sister First Degree Relatives   Social History  Tobacco use  never smoker Advance Directives  none Post-Surgical Plans  home with HHPT Living situation  Lives with spouse. one level home with one step to enter Alcohol use  Never consumed alcohol. Children  2 sons Marital status  Married.  Medication History Meloxicam (15MG  Tablet, Oral) Active. Allegra Allergy (Oral) Specific strength unknown - Active. Mucinex Allergy (180MG  Tablet, Oral) Active. Invokana (Oral) Specific strength unknown - Active. Onglyza (Oral) Specific strength unknown - Active. Magnesium Gluconate (Oral) Specific strength unknown - Active. Aspirin EC (81MG  Tablet DR, Oral) Active. Multivitamin (Oral) Active. Allopurinol (Oral) Specific strength unknown - Active. Carbidopa-Levodopa (25-250MG  Tablet, Oral) Active. Acyclovir (400MG  Tablet, Oral) Active. (PRN) Cymbalta (60MG  Capsule DR Part, Oral) Active. Enalapril Maleate (20MG  Tablet, Oral) Active. Gabapentin (300MG  Capsule, Oral) Active. MetFORMIN HCl (1000MG  Tablet, Oral) Active. Nasonex (50MCG/ACT Suspension, Nasal) Active. (PRN) Vitamin D (Ergocalciferol) (50000UNIT Capsule, Oral) Active. Pantoprazole Sodium (40MG  Tablet DR, Oral) Active. Fish Oil (Oral) Specific strength unknown - Active. Colcrys (0.6MG  Tablet, Oral) Active. Medications Reconciled  Past Surgical History Neck Disc Surgery  posterior fusion 06/2010 Anal Fissure Repair  2004 Sinus Surgery  1990s Straighten Nasal Septum  Colon Polyp Removal - Colonoscopy   Tonsillectomy  1990s Prostate Surgery - Removal  Laparoscopic. robot-assisted Other Surgery  wisdom tooth extraction Catheterization, Right & Left Heart  with coronary angiogram Spinal Fusion - Lower Back  05/2014 Arthroscopic Knee Surgery - Left  12/2014 Dr.  Beane  Review of Systems General Not Present- Chills, Fatigue, Fever, Memory Loss, Night Sweats, Weight Gain and Weight Loss. Skin Not Present- Eczema, Hives, Itching, Lesions and Rash. HEENT Not Present- Dentures, Double Vision, Headache, Hearing Loss, Tinnitus and Visual Loss. Respiratory Present- Shortness of breath with exertion. Not Present- Allergies, Chronic Cough, Coughing up blood and Shortness of breath at rest. Cardiovascular Not Present- Chest Pain, Difficulty Breathing Lying Down, Murmur, Palpitations, Racing/skipping heartbeats and Swelling. Gastrointestinal Not Present- Abdominal Pain, Bloody Stool, Constipation, Diarrhea, Difficulty Swallowing, Heartburn, Jaundice, Loss of appetitie, Nausea and Vomiting. Male Genitourinary Present- Incontinence. Not Present- Blood in Urine, Discharge, Flank Pain, Painful Urination, Urgency, Urinary frequency, Urinary Retention, Urinating at Night and Weak urinary stream. Musculoskeletal Present- Back Pain and Joint Pain. Not Present- Joint Swelling, Morning Stiffness, Muscle Pain, Muscle Weakness and Spasms. Neurological Present- Difficulty with balance. Not Present- Blackout spells, Dizziness, Paralysis, Tremor and Weakness. Psychiatric Not Present- Insomnia.  Physical Exam  General Mental Status -Alert, cooperative and good historian. General Appearance-pleasant, Not in acute distress. Orientation-Oriented X3. Build & Nutrition-Obese. Gait-Use of assistive device(cane) and Antalgic.  Head and Neck Head-normocephalic, atraumatic . Neck Global Assessment - supple, no bruit auscultated on the right, no bruit auscultated on the left.  Eye Pupil -  Bilateral-Regular and Round. Motion - Bilateral-EOMI.  Chest and Lung Exam Auscultation Breath sounds - clear at anterior chest wall and clear at posterior chest wall. Adventitious sounds - No Adventitious sounds.  Cardiovascular Auscultation Rhythm - Regular rate and rhythm. Heart Sounds - S1 WNL and S2 WNL. Murmurs & Other Heart Sounds - Auscultation of the heart reveals - No Murmurs.  Abdomen Palpation/Percussion Tenderness - Abdomen is non-tender to palpation. Rigidity (guarding) - Abdomen is soft. Auscultation Auscultation of the abdomen reveals - Bowel sounds normal.  Male Genitourinary Not done, not pertinent to present illness  Musculoskeletal On exam, he is walking with an antalgic gait. Moderate distress. Exquisitely tender within the medial joint line and has patellofemoral pain with compression and crepitus. Range is -5 to 110. Ipsilateral hip and ankle exam is unremarkable.  Imaging X-rays repeat, AP, lateral, Merchant demonstrates bone-on-bone arthrosis, particularly the patellofemoral and sulcus. He has progressive medial joint space narrowing, some degenerative change in the lateral compartment.  Assessment & Plan Unilateral primary osteoarthritis, left knee (M17.12)  Pt with end-stage Left knee DJD, bone-on-bone, refractory to conservative tx, scheduled for Left total knee replacement by Dr. Tonita Cong on 08/05/15. We again discussed the procedure itself as well as risks, complications and alternatives, including but not limited to DVT, PE, infx, bleeding, failure of procedure, need for secondary procedure including manipulation, nerve injury, ongoing pain/symptoms, anesthesia risk, even stroke or death. Also discussed typical post-op protocols, activity restrictions, need for PT, flexion/extension exercises, time out of work. Discussed need for DVT ppx post-op with Xarelto then ASA per protocol. Discussed dental ppx. Also discussed limitations post-operatively such as  kneeling and squatting. All questions were answered. Patient desires to proceed with surgery as scheduled. Will hold supplements, ASA, and NSAIDs accordingly. Will remain NPO after MN night before surgery. Has been instructed on what medications to take AM of surgery. Has already completed his pre-op testing at [facility]. Plan Xarelto 2 weeks post-op for DVT ppx then ASA. Plan Norco (Oxy has not been well tolerated in the past - confusion), Robaxin, Colace, Miralax. Plan home with HHPT post-op with family members at home for assistance. Will follow up 10-14 days post-op for staple removal and xrays.  Plan Left total  knee replacement  Signed electronically by Lacie Draft, PA-C for Dr. Tonita Cong

## 2015-08-04 MED ORDER — DEXTROSE 5 % IV SOLN
3.0000 g | INTRAVENOUS | Status: AC
  Administered 2015-08-05: 3 g via INTRAVENOUS
  Filled 2015-08-04: qty 3

## 2015-08-05 ENCOUNTER — Inpatient Hospital Stay (HOSPITAL_COMMUNITY)
Admission: RE | Admit: 2015-08-05 | Discharge: 2015-08-07 | DRG: 470 | Disposition: A | Discharge status: Home Health | Payer: Medicare Other | Source: Ambulatory Visit | Attending: Specialist | Admitting: Specialist

## 2015-08-05 ENCOUNTER — Encounter (HOSPITAL_COMMUNITY)
Admission: RE | Disposition: A | Discharge status: Home Health | Payer: Self-pay | Source: Ambulatory Visit | Attending: Specialist

## 2015-08-05 ENCOUNTER — Encounter (HOSPITAL_COMMUNITY): Payer: Self-pay | Admitting: *Deleted

## 2015-08-05 ENCOUNTER — Inpatient Hospital Stay (HOSPITAL_COMMUNITY): Payer: Medicare Other | Admitting: Certified Registered Nurse Anesthetist

## 2015-08-05 ENCOUNTER — Inpatient Hospital Stay (HOSPITAL_COMMUNITY): Payer: Medicare Other

## 2015-08-05 DIAGNOSIS — E7800 Pure hypercholesterolemia, unspecified: Secondary | ICD-10-CM | POA: Diagnosis present

## 2015-08-05 DIAGNOSIS — M109 Gout, unspecified: Secondary | ICD-10-CM | POA: Diagnosis present

## 2015-08-05 DIAGNOSIS — I129 Hypertensive chronic kidney disease with stage 1 through stage 4 chronic kidney disease, or unspecified chronic kidney disease: Secondary | ICD-10-CM | POA: Diagnosis present

## 2015-08-05 DIAGNOSIS — E1122 Type 2 diabetes mellitus with diabetic chronic kidney disease: Secondary | ICD-10-CM | POA: Diagnosis present

## 2015-08-05 DIAGNOSIS — R32 Unspecified urinary incontinence: Secondary | ICD-10-CM | POA: Diagnosis present

## 2015-08-05 DIAGNOSIS — Z79899 Other long term (current) drug therapy: Secondary | ICD-10-CM

## 2015-08-05 DIAGNOSIS — K449 Diaphragmatic hernia without obstruction or gangrene: Secondary | ICD-10-CM | POA: Diagnosis present

## 2015-08-05 DIAGNOSIS — Z6839 Body mass index (BMI) 39.0-39.9, adult: Secondary | ICD-10-CM

## 2015-08-05 DIAGNOSIS — E669 Obesity, unspecified: Secondary | ICD-10-CM | POA: Diagnosis present

## 2015-08-05 DIAGNOSIS — Z8546 Personal history of malignant neoplasm of prostate: Secondary | ICD-10-CM | POA: Diagnosis not present

## 2015-08-05 DIAGNOSIS — K219 Gastro-esophageal reflux disease without esophagitis: Secondary | ICD-10-CM | POA: Diagnosis present

## 2015-08-05 DIAGNOSIS — Z7984 Long term (current) use of oral hypoglycemic drugs: Secondary | ICD-10-CM

## 2015-08-05 DIAGNOSIS — G4733 Obstructive sleep apnea (adult) (pediatric): Secondary | ICD-10-CM | POA: Diagnosis present

## 2015-08-05 DIAGNOSIS — I739 Peripheral vascular disease, unspecified: Secondary | ICD-10-CM | POA: Diagnosis present

## 2015-08-05 DIAGNOSIS — E785 Hyperlipidemia, unspecified: Secondary | ICD-10-CM | POA: Diagnosis present

## 2015-08-05 DIAGNOSIS — N182 Chronic kidney disease, stage 2 (mild): Secondary | ICD-10-CM | POA: Diagnosis present

## 2015-08-05 DIAGNOSIS — M170 Bilateral primary osteoarthritis of knee: Secondary | ICD-10-CM | POA: Diagnosis present

## 2015-08-05 DIAGNOSIS — Z981 Arthrodesis status: Secondary | ICD-10-CM | POA: Diagnosis not present

## 2015-08-05 DIAGNOSIS — M1712 Unilateral primary osteoarthritis, left knee: Secondary | ICD-10-CM

## 2015-08-05 DIAGNOSIS — G629 Polyneuropathy, unspecified: Secondary | ICD-10-CM | POA: Diagnosis present

## 2015-08-05 DIAGNOSIS — Z96659 Presence of unspecified artificial knee joint: Secondary | ICD-10-CM

## 2015-08-05 DIAGNOSIS — M25562 Pain in left knee: Secondary | ICD-10-CM | POA: Diagnosis present

## 2015-08-05 HISTORY — PX: TOTAL KNEE ARTHROPLASTY: SHX125

## 2015-08-05 LAB — GLUCOSE, CAPILLARY
Glucose-Capillary: 156 mg/dL — ABNORMAL HIGH (ref 65–99)
Glucose-Capillary: 100 mg/dL — ABNORMAL HIGH (ref 65–99)
Glucose-Capillary: 139 mg/dL — ABNORMAL HIGH (ref 65–99)
Glucose-Capillary: 211 mg/dL — ABNORMAL HIGH (ref 65–99)

## 2015-08-05 LAB — TYPE AND SCREEN
ABO/RH(D): O POS
Antibody Screen: NEGATIVE

## 2015-08-05 SURGERY — ARTHROPLASTY, KNEE, TOTAL
Anesthesia: Monitor Anesthesia Care | Site: Knee | Laterality: Left

## 2015-08-05 MED ORDER — ALLOPURINOL 300 MG PO TABS
300.0000 mg | ORAL_TABLET | Freq: Every evening | ORAL | Status: DC
  Administered 2015-08-05 – 2015-08-06 (×2): 300 mg via ORAL
  Filled 2015-08-05 (×2): qty 1

## 2015-08-05 MED ORDER — ACYCLOVIR 400 MG PO TABS: 400.0000 mg | ORAL_TABLET | Freq: Every day | ORAL | Status: DC | PRN

## 2015-08-05 MED ORDER — METHOCARBAMOL 1000 MG/10ML IJ SOLN
500.0000 mg | Freq: Four times a day (QID) | INTRAMUSCULAR | Status: DC | PRN
  Administered 2015-08-05 – 2015-08-06 (×3): 500 mg via INTRAVENOUS
  Filled 2015-08-05 (×2): qty 5
  Filled 2015-08-05 (×2): qty 550
  Filled 2015-08-05: qty 5

## 2015-08-05 MED ORDER — METHOCARBAMOL 500 MG PO TABS
500.0000 mg | ORAL_TABLET | Freq: Four times a day (QID) | ORAL | Status: DC | PRN
  Administered 2015-08-06: 500 mg via ORAL
  Filled 2015-08-05: qty 1

## 2015-08-05 MED ORDER — LINAGLIPTIN 5 MG PO TABS
5.0000 mg | ORAL_TABLET | Freq: Every day | ORAL | Status: DC
  Administered 2015-08-06 – 2015-08-07 (×2): 5 mg via ORAL
  Filled 2015-08-05 (×2): qty 1

## 2015-08-05 MED ORDER — RIVAROXABAN 10 MG PO TABS
10.0000 mg | ORAL_TABLET | Freq: Every day | ORAL | Status: DC
  Administered 2015-08-06 – 2015-08-07 (×2): 10 mg via ORAL
  Filled 2015-08-05 (×2): qty 1

## 2015-08-05 MED ORDER — POLYETHYLENE GLYCOL 3350 17 G PO PACK: 17.0000 g | PACK | Freq: Every day | ORAL | Status: DC

## 2015-08-05 MED ORDER — PROPOFOL 10 MG/ML IV BOLUS
INTRAVENOUS | Status: AC
  Filled 2015-08-05: qty 20

## 2015-08-05 MED ORDER — PROPOFOL 500 MG/50ML IV EMUL
INTRAVENOUS | Status: DC | PRN
Start: 2015-08-05 — End: 2015-08-05
  Administered 2015-08-05: 50 ug/kg/min via INTRAVENOUS

## 2015-08-05 MED ORDER — ALUM & MAG HYDROXIDE-SIMETH 200-200-20 MG/5ML PO SUSP: 30.0000 mL | ORAL | Status: DC | PRN

## 2015-08-05 MED ORDER — KCL IN DEXTROSE-NACL 20-5-0.45 MEQ/L-%-% IV SOLN
INTRAVENOUS | Status: AC
  Administered 2015-08-05: 50 mL/h via INTRAVENOUS
  Filled 2015-08-05 (×2): qty 1000

## 2015-08-05 MED ORDER — PROPOFOL 10 MG/ML IV BOLUS
INTRAVENOUS | Status: AC
  Filled 2015-08-05: qty 40

## 2015-08-05 MED ORDER — DOCUSATE SODIUM 100 MG PO CAPS
100.0000 mg | ORAL_CAPSULE | Freq: Two times a day (BID) | ORAL | Status: DC
  Administered 2015-08-05 – 2015-08-07 (×4): 100 mg via ORAL
  Filled 2015-08-05 (×4): qty 1

## 2015-08-05 MED ORDER — DULOXETINE HCL 30 MG PO CPEP
60.0000 mg | ORAL_CAPSULE | Freq: Every evening | ORAL | Status: DC
  Administered 2015-08-05 – 2015-08-06 (×2): 60 mg via ORAL
  Filled 2015-08-05 (×3): qty 2

## 2015-08-05 MED ORDER — PROPOFOL 10 MG/ML IV BOLUS
INTRAVENOUS | Status: DC | PRN
  Administered 2015-08-05 (×2): 10 mg via INTRAVENOUS
  Administered 2015-08-05: 20 mg via INTRAVENOUS
  Administered 2015-08-05 (×2): 10 mg via INTRAVENOUS

## 2015-08-05 MED ORDER — FLUTICASONE PROPIONATE 50 MCG/ACT NA SUSP
1.0000 | Freq: Every day | NASAL | Status: DC
  Administered 2015-08-06 – 2015-08-07 (×2): 1 via NASAL
  Filled 2015-08-05: qty 16

## 2015-08-05 MED ORDER — ROCURONIUM BROMIDE 100 MG/10ML IV SOLN
INTRAVENOUS | Status: AC
  Filled 2015-08-05: qty 1

## 2015-08-05 MED ORDER — SODIUM CHLORIDE 0.9 % IR SOLN
Status: AC
  Filled 2015-08-05: qty 1

## 2015-08-05 MED ORDER — PHENYLEPHRINE 40 MCG/ML (10ML) SYRINGE FOR IV PUSH (FOR BLOOD PRESSURE SUPPORT)
PREFILLED_SYRINGE | INTRAVENOUS | Status: AC
  Filled 2015-08-05: qty 10

## 2015-08-05 MED ORDER — SUGAMMADEX SODIUM 200 MG/2ML IV SOLN
INTRAVENOUS | Status: AC
  Filled 2015-08-05: qty 2

## 2015-08-05 MED ORDER — OMEGA-3-ACID ETHYL ESTERS 1 G PO CAPS
2.0000 g | ORAL_CAPSULE | Freq: Two times a day (BID) | ORAL | Status: DC
  Administered 2015-08-05: 2 g via ORAL
  Filled 2015-08-05 (×3): qty 2

## 2015-08-05 MED ORDER — ONDANSETRON HCL 4 MG PO TABS: 4.0000 mg | ORAL_TABLET | Freq: Four times a day (QID) | ORAL | Status: DC | PRN

## 2015-08-05 MED ORDER — ONDANSETRON HCL 4 MG/2ML IJ SOLN
INTRAMUSCULAR | Status: DC | PRN
  Administered 2015-08-05: 4 mg via INTRAVENOUS

## 2015-08-05 MED ORDER — OXYCODONE HCL 5 MG PO TABS
5.0000 mg | ORAL_TABLET | ORAL | Status: DC | PRN
  Administered 2015-08-05: 10 mg via ORAL
  Administered 2015-08-05 (×2): 5 mg via ORAL
  Administered 2015-08-06 – 2015-08-07 (×7): 10 mg via ORAL
  Filled 2015-08-05 (×9): qty 2
  Filled 2015-08-05: qty 1

## 2015-08-05 MED ORDER — MIDAZOLAM HCL 2 MG/2ML IJ SOLN
INTRAMUSCULAR | Status: AC
  Filled 2015-08-05: qty 2

## 2015-08-05 MED ORDER — BISACODYL 5 MG PO TBEC: 5.0000 mg | DELAYED_RELEASE_TABLET | Freq: Every day | ORAL | Status: DC | PRN

## 2015-08-05 MED ORDER — BUPIVACAINE HCL (PF) 0.5 % IJ SOLN
INTRAMUSCULAR | Status: AC
  Filled 2015-08-05: qty 30

## 2015-08-05 MED ORDER — OXYCODONE HCL 5 MG PO TABS: 5.0000 mg | ORAL_TABLET | ORAL | Status: DC | PRN

## 2015-08-05 MED ORDER — CARBIDOPA-LEVODOPA 25-250 MG PO TABS
1.0000 | ORAL_TABLET | Freq: Two times a day (BID) | ORAL | Status: DC
  Administered 2015-08-05 – 2015-08-07 (×4): 1 via ORAL
  Filled 2015-08-05 (×4): qty 1

## 2015-08-05 MED ORDER — RIVAROXABAN 10 MG PO TABS: 10.0000 mg | ORAL_TABLET | Freq: Every day | ORAL | Status: DC

## 2015-08-05 MED ORDER — ENALAPRIL MALEATE 20 MG PO TABS
20.0000 mg | ORAL_TABLET | Freq: Two times a day (BID) | ORAL | Status: DC
  Administered 2015-08-05 – 2015-08-07 (×4): 20 mg via ORAL
  Filled 2015-08-05 (×4): qty 1

## 2015-08-05 MED ORDER — DEXTROSE 5 % IV SOLN
3.0000 g | Freq: Four times a day (QID) | INTRAVENOUS | Status: AC
  Administered 2015-08-05 – 2015-08-06 (×3): 3 g via INTRAVENOUS
  Filled 2015-08-05: qty 3
  Filled 2015-08-05: qty 3000
  Filled 2015-08-05: qty 3

## 2015-08-05 MED ORDER — POLYETHYLENE GLYCOL 3350 17 G PO PACK: 17.0000 g | PACK | Freq: Every day | ORAL | Status: DC | PRN

## 2015-08-05 MED ORDER — FENTANYL CITRATE (PF) 100 MCG/2ML IJ SOLN
INTRAMUSCULAR | Status: DC | PRN
  Administered 2015-08-05: 50 ug via INTRAVENOUS

## 2015-08-05 MED ORDER — METHOCARBAMOL 500 MG PO TABS: 500.0000 mg | ORAL_TABLET | Freq: Four times a day (QID) | ORAL | Status: DC | PRN

## 2015-08-05 MED ORDER — GABAPENTIN 300 MG PO CAPS
600.0000 mg | ORAL_CAPSULE | Freq: Three times a day (TID) | ORAL | Status: DC
  Administered 2015-08-05 – 2015-08-07 (×5): 600 mg via ORAL
  Filled 2015-08-05 (×5): qty 2

## 2015-08-05 MED ORDER — COLCHICINE 0.6 MG PO TABS
0.6000 mg | ORAL_TABLET | Freq: Two times a day (BID) | ORAL | Status: DC | PRN
  Filled 2015-08-05: qty 1

## 2015-08-05 MED ORDER — TRANEXAMIC ACID 1000 MG/10ML IV SOLN
1000.0000 mg | INTRAVENOUS | Status: AC
  Administered 2015-08-05: 1000 mg via INTRAVENOUS
  Filled 2015-08-05: qty 10

## 2015-08-05 MED ORDER — MAGNESIUM CITRATE PO SOLN: 1.0000 | Freq: Once | ORAL | Status: DC | PRN

## 2015-08-05 MED ORDER — FENTANYL CITRATE (PF) 100 MCG/2ML IJ SOLN: 25.0000 ug | INTRAMUSCULAR | Status: DC | PRN

## 2015-08-05 MED ORDER — METOCLOPRAMIDE HCL 5 MG PO TABS: 5.0000 mg | ORAL_TABLET | Freq: Three times a day (TID) | ORAL | Status: DC | PRN

## 2015-08-05 MED ORDER — LORATADINE 10 MG PO TABS
10.0000 mg | ORAL_TABLET | Freq: Every day | ORAL | Status: DC
  Administered 2015-08-06 – 2015-08-07 (×2): 10 mg via ORAL
  Filled 2015-08-05 (×2): qty 1

## 2015-08-05 MED ORDER — FENTANYL CITRATE (PF) 250 MCG/5ML IJ SOLN
INTRAMUSCULAR | Status: AC
  Filled 2015-08-05: qty 5

## 2015-08-05 MED ORDER — BUPIVACAINE-EPINEPHRINE 0.25% -1:200000 IJ SOLN
INTRAMUSCULAR | Status: AC
  Filled 2015-08-05: qty 1

## 2015-08-05 MED ORDER — DIPHENHYDRAMINE HCL 12.5 MG/5ML PO ELIX: 12.5000 mg | ORAL_SOLUTION | ORAL | Status: DC | PRN

## 2015-08-05 MED ORDER — RISAQUAD PO CAPS
1.0000 | ORAL_CAPSULE | Freq: Every day | ORAL | Status: DC
  Administered 2015-08-06: 1 via ORAL
  Filled 2015-08-05 (×3): qty 1

## 2015-08-05 MED ORDER — CANAGLIFLOZIN 300 MG PO TABS
300.0000 mg | ORAL_TABLET | Freq: Every day | ORAL | Status: DC
  Administered 2015-08-06 – 2015-08-07 (×2): 300 mg via ORAL
  Filled 2015-08-05 (×2): qty 1

## 2015-08-05 MED ORDER — ONDANSETRON HCL 4 MG/2ML IJ SOLN: 4.0000 mg | Freq: Once | INTRAMUSCULAR | Status: DC | PRN

## 2015-08-05 MED ORDER — PHENOL 1.4 % MT LIQD
1.0000 | OROMUCOSAL | Status: DC | PRN
  Filled 2015-08-05: qty 177

## 2015-08-05 MED ORDER — GUAIFENESIN ER 600 MG PO TB12: 600.0000 mg | ORAL_TABLET | Freq: Two times a day (BID) | ORAL | Status: DC | PRN

## 2015-08-05 MED ORDER — BUPROPION HCL ER (XL) 150 MG PO TB24
150.0000 mg | ORAL_TABLET | Freq: Every day | ORAL | Status: DC
  Administered 2015-08-06: 150 mg via ORAL
  Filled 2015-08-05 (×2): qty 1

## 2015-08-05 MED ORDER — DOCUSATE SODIUM 100 MG PO CAPS: 100.0000 mg | ORAL_CAPSULE | Freq: Two times a day (BID) | ORAL | Status: DC | PRN

## 2015-08-05 MED ORDER — BUPIVACAINE HCL (PF) 0.5 % IJ SOLN
INTRAMUSCULAR | Status: DC | PRN
  Administered 2015-08-05: 3 mL

## 2015-08-05 MED ORDER — ACETAMINOPHEN 325 MG PO TABS
650.0000 mg | ORAL_TABLET | Freq: Four times a day (QID) | ORAL | Status: DC | PRN
  Administered 2015-08-05 – 2015-08-06 (×2): 650 mg via ORAL
  Filled 2015-08-05 (×2): qty 2

## 2015-08-05 MED ORDER — ONDANSETRON HCL 4 MG/2ML IJ SOLN: 4.0000 mg | Freq: Four times a day (QID) | INTRAMUSCULAR | Status: DC | PRN

## 2015-08-05 MED ORDER — METOCLOPRAMIDE HCL 5 MG/ML IJ SOLN: 5.0000 mg | Freq: Three times a day (TID) | INTRAMUSCULAR | Status: DC | PRN

## 2015-08-05 MED ORDER — ACETAMINOPHEN 650 MG RE SUPP: 650.0000 mg | Freq: Four times a day (QID) | RECTAL | Status: DC | PRN

## 2015-08-05 MED ORDER — SODIUM CHLORIDE 0.9 % IR SOLN
Status: DC | PRN
  Administered 2015-08-05: 500 mL

## 2015-08-05 MED ORDER — ONDANSETRON HCL 4 MG/2ML IJ SOLN
INTRAMUSCULAR | Status: AC
  Filled 2015-08-05: qty 2

## 2015-08-05 MED ORDER — LACTATED RINGERS IV SOLN
INTRAVENOUS | Status: DC
  Administered 2015-08-05 (×2): via INTRAVENOUS
  Administered 2015-08-05: 1000 mL via INTRAVENOUS
  Administered 2015-08-05: 11:00:00 via INTRAVENOUS

## 2015-08-05 MED ORDER — PHENYLEPHRINE HCL 10 MG/ML IJ SOLN
INTRAMUSCULAR | Status: DC | PRN
  Administered 2015-08-05: 40 ug via INTRAVENOUS
  Administered 2015-08-05 (×2): 80 ug via INTRAVENOUS
  Administered 2015-08-05: 40 ug via INTRAVENOUS
  Administered 2015-08-05 (×2): 80 ug via INTRAVENOUS
  Administered 2015-08-05: 40 ug via INTRAVENOUS
  Administered 2015-08-05 (×2): 80 ug via INTRAVENOUS
  Administered 2015-08-05: 40 ug via INTRAVENOUS
  Administered 2015-08-05 (×4): 80 ug via INTRAVENOUS

## 2015-08-05 MED ORDER — MIDAZOLAM HCL 5 MG/5ML IJ SOLN
INTRAMUSCULAR | Status: DC | PRN
Start: 2015-08-05 — End: 2015-08-05
  Administered 2015-08-05: 2 mg via INTRAVENOUS

## 2015-08-05 MED ORDER — PANTOPRAZOLE SODIUM 40 MG PO TBEC
40.0000 mg | DELAYED_RELEASE_TABLET | Freq: Every day | ORAL | Status: DC
  Administered 2015-08-06 – 2015-08-07 (×2): 40 mg via ORAL
  Filled 2015-08-05 (×2): qty 1

## 2015-08-05 MED ORDER — BUPIVACAINE-EPINEPHRINE 0.25% -1:200000 IJ SOLN
INTRAMUSCULAR | Status: DC | PRN
  Administered 2015-08-05: 50 mL

## 2015-08-05 MED ORDER — LIDOCAINE HCL (CARDIAC) 20 MG/ML IV SOLN
INTRAVENOUS | Status: AC
  Filled 2015-08-05: qty 5

## 2015-08-05 MED ORDER — MENTHOL 3 MG MT LOZG: 1.0000 | LOZENGE | OROMUCOSAL | Status: DC | PRN

## 2015-08-05 MED ORDER — INSULIN ASPART 100 UNIT/ML ~~LOC~~ SOLN
0.0000 [IU] | Freq: Three times a day (TID) | SUBCUTANEOUS | Status: DC
  Administered 2015-08-05: 2 [IU] via SUBCUTANEOUS
  Administered 2015-08-06 (×2): 3 [IU] via SUBCUTANEOUS
  Administered 2015-08-06: 5 [IU] via SUBCUTANEOUS
  Administered 2015-08-07: 3 [IU] via SUBCUTANEOUS
  Administered 2015-08-07: 5 [IU] via SUBCUTANEOUS

## 2015-08-05 MED ORDER — DEXAMETHASONE SODIUM PHOSPHATE 10 MG/ML IJ SOLN
INTRAMUSCULAR | Status: AC
  Filled 2015-08-05: qty 1

## 2015-08-05 SURGICAL SUPPLY — 58 items
BAG ZIPLOCK 12X15 (MISCELLANEOUS) IMPLANT
BANDAGE ACE 4X5 VEL STRL LF (GAUZE/BANDAGES/DRESSINGS) ×3 IMPLANT
BANDAGE ACE 6X5 VEL STRL LF (GAUZE/BANDAGES/DRESSINGS) ×3 IMPLANT
BLADE SAG 18X100X1.27 (BLADE) ×3 IMPLANT
BLADE SAW SGTL 13.0X1.19X90.0M (BLADE) ×3 IMPLANT
CAPT KNEE TOTAL 3 ATTUNE ×3 IMPLANT
CEMENT HV SMART SET (Cement) ×6 IMPLANT
CLOSURE WOUND 1/2 X4 (GAUZE/BANDAGES/DRESSINGS) ×2
CLOTH 2% CHLOROHEXIDINE 3PK (PERSONAL CARE ITEMS) ×3 IMPLANT
CUFF TOURN SGL QUICK 34 (TOURNIQUET CUFF) ×2
CUFF TRNQT CYL 34X4X40X1 (TOURNIQUET CUFF) ×1 IMPLANT
DECANTER SPIKE VIAL GLASS SM (MISCELLANEOUS) ×3 IMPLANT
DRAPE INCISE IOBAN 66X45 STRL (DRAPES) IMPLANT
DRAPE LG THREE QUARTER DISP (DRAPES) ×3 IMPLANT
DRAPE ORTHO SPLIT 77X108 STRL (DRAPES) ×4
DRAPE SURG ORHT 6 SPLT 77X108 (DRAPES) ×2 IMPLANT
DRAPE U-SHAPE 47X51 STRL (DRAPES) ×3 IMPLANT
DRSG AQUACEL AG ADV 3.5X10 (GAUZE/BANDAGES/DRESSINGS) ×3 IMPLANT
DRSG TEGADERM 4X4.75 (GAUZE/BANDAGES/DRESSINGS) IMPLANT
DURAPREP 26ML APPLICATOR (WOUND CARE) ×3 IMPLANT
ELECT REM PT RETURN 9FT ADLT (ELECTROSURGICAL) ×3
ELECTRODE REM PT RTRN 9FT ADLT (ELECTROSURGICAL) ×1 IMPLANT
EVACUATOR 1/8 PVC DRAIN (DRAIN) IMPLANT
GAUZE SPONGE 2X2 8PLY STRL LF (GAUZE/BANDAGES/DRESSINGS) IMPLANT
GLOVE BIOGEL PI IND STRL 7.0 (GLOVE) ×1 IMPLANT
GLOVE BIOGEL PI IND STRL 8 (GLOVE) ×1 IMPLANT
GLOVE BIOGEL PI INDICATOR 7.0 (GLOVE) ×2
GLOVE BIOGEL PI INDICATOR 8 (GLOVE) ×2
GLOVE SURG SS PI 7.0 STRL IVOR (GLOVE) ×3 IMPLANT
GLOVE SURG SS PI 7.5 STRL IVOR (GLOVE) IMPLANT
GLOVE SURG SS PI 8.0 STRL IVOR (GLOVE) ×6 IMPLANT
GOWN STRL REUS W/TWL XL LVL3 (GOWN DISPOSABLE) ×6 IMPLANT
HANDPIECE INTERPULSE COAX TIP (DISPOSABLE) ×2
IMMOBILIZER KNEE 20 (SOFTGOODS) ×3
IMMOBILIZER KNEE 20 THIGH 36 (SOFTGOODS) ×1 IMPLANT
MANIFOLD NEPTUNE II (INSTRUMENTS) ×3 IMPLANT
NS IRRIG 1000ML POUR BTL (IV SOLUTION) IMPLANT
PACK TOTAL KNEE CUSTOM (KITS) ×3 IMPLANT
POSITIONER SURGICAL ARM (MISCELLANEOUS) ×3 IMPLANT
SET HNDPC FAN SPRY TIP SCT (DISPOSABLE) ×1 IMPLANT
SPONGE GAUZE 2X2 STER 10/PKG (GAUZE/BANDAGES/DRESSINGS)
SPONGE SURGIFOAM ABS GEL 100 (HEMOSTASIS) IMPLANT
STAPLER VISISTAT (STAPLE) ×3 IMPLANT
STRIP CLOSURE SKIN 1/2X4 (GAUZE/BANDAGES/DRESSINGS) ×4 IMPLANT
SUT BONE WAX W31G (SUTURE) ×3 IMPLANT
SUT MNCRL AB 4-0 PS2 18 (SUTURE) ×3 IMPLANT
SUT VIC AB 1 CT1 27 (SUTURE) ×4
SUT VIC AB 1 CT1 27XBRD ANTBC (SUTURE) ×2 IMPLANT
SUT VIC AB 2-0 CT1 27 (SUTURE) ×6
SUT VIC AB 2-0 CT1 TAPERPNT 27 (SUTURE) ×3 IMPLANT
SUT VLOC 180 0 24IN GS25 (SUTURE) IMPLANT
SYR 50ML LL SCALE MARK (SYRINGE) ×3 IMPLANT
TOWER CARTRIDGE SMART MIX (DISPOSABLE) ×3 IMPLANT
TRAY FOLEY W/METER SILVER 14FR (SET/KITS/TRAYS/PACK) IMPLANT
TRAY FOLEY W/METER SILVER 16FR (SET/KITS/TRAYS/PACK) ×3 IMPLANT
WATER STERILE IRR 1500ML POUR (IV SOLUTION) ×3 IMPLANT
WRAP KNEE MAXI GEL POST OP (GAUZE/BANDAGES/DRESSINGS) ×3 IMPLANT
YANKAUER SUCT BULB TIP 10FT TU (MISCELLANEOUS) ×3 IMPLANT

## 2015-08-05 NOTE — Anesthesia Procedure Notes (Signed)
Spinal  Start time: 08/05/2015 10:28 AM End time: 08/05/2015 10:34 AM Staffing Anesthesiologist: Catalina Gravel Resident/CRNA: Darlys Gales R Performed by: resident/CRNA  Preanesthetic Checklist Completed: patient identified, site marked, surgical consent, pre-op evaluation, timeout performed, IV checked, risks and benefits discussed and monitors and equipment checked Spinal Block Patient position: sitting Prep: ChloraPrep Patient monitoring: heart rate, continuous pulse ox and blood pressure Approach: midline Location: L2-3 Needle Needle type: Sprotte  Needle gauge: 24 G Needle length: 10 cm Needle insertion depth: 9 cm Assessment Sensory level: T6

## 2015-08-05 NOTE — Anesthesia Preprocedure Evaluation (Addendum)
Anesthesia Evaluation  Patient identified by MRN, date of birth, ID band Patient awake    Reviewed: Allergy & Precautions, NPO status , Patient's Chart, lab work & pertinent test results  History of Anesthesia Complications (+) history of anesthetic complications (remained intubated after ACDF)  Airway Mallampati: II  TM Distance: >3 FB Neck ROM: Full    Dental  (+) Teeth Intact, Dental Advisory Given, Chipped,    Pulmonary sleep apnea and Continuous Positive Airway Pressure Ventilation ,    Pulmonary exam normal breath sounds clear to auscultation       Cardiovascular hypertension, Pt. on medications + Peripheral Vascular Disease  Normal cardiovascular exam+ dysrhythmias (Incomplete RBBB)  Rhythm:Regular Rate:Normal  History of normal coronary arteries by cardiac catheterization performed by Dr. Martinique 08/25/11   Neuro/Psych S/p cervical fusion S/p L4-S1 fusion   Neuromuscular disease negative psych ROS   GI/Hepatic Neg liver ROS, hiatal hernia, GERD (h/o Barrett's esophagus)  Medicated,  Endo/Other  diabetes, Type 2, Oral Hypoglycemic AgentsObesity   Renal/GU Renal InsufficiencyRenal disease     Musculoskeletal  (+) Arthritis , Osteoarthritis,    Abdominal   Peds  Hematology negative hematology ROS (+) Plt 263k   Anesthesia Other Findings Day of surgery medications reviewed with the patient.  Reproductive/Obstetrics                           Anesthesia Physical Anesthesia Plan  ASA: III  Anesthesia Plan: Spinal and MAC   Post-op Pain Management:    Induction: Intravenous  Airway Management Planned: Simple Face Mask  Additional Equipment:   Intra-op Plan:   Post-operative Plan:   Informed Consent: I have reviewed the patients History and Physical, chart, labs and discussed the procedure including the risks, benefits and alternatives for the proposed anesthesia with the patient  or authorized representative who has indicated his/her understanding and acceptance.   Dental advisory given  Plan Discussed with: CRNA  Anesthesia Plan Comments: (Discussed risks and benefits of and differences between spinal and general. Discussed risks of spinal including headache, backache, failure, bleeding, infection, and nerve damage. Patient consents to spinal. Questions answered. Coagulation studies and platelet count acceptable.)       Anesthesia Quick Evaluation

## 2015-08-05 NOTE — Transfer of Care (Signed)
Immediate Anesthesia Transfer of Care Note  Patient: Jason Wyatt  Procedure(s) Performed: Procedure(s): LEFT TOTAL KNEE ARTHROPLASTY (Left)  Patient Location: PACU  Anesthesia Type:Spinal  Level of Consciousness: awake, alert  and oriented  Airway & Oxygen Therapy: Patient Spontanous Breathing and Patient connected to face mask oxygen  Post-op Assessment: Report given to RN and Post -op Vital signs reviewed and stable  Post vital signs: Reviewed and stable  Last Vitals: There were no vitals filed for this visit.  Last Pain: There were no vitals filed for this visit.    Patients Stated Pain Goal: 4 ([id number])  Complications: No apparent anesthesia complications

## 2015-08-05 NOTE — H&P (View-Only) (Signed)
Jason Wyatt DOB: 1955-09-09 Married / Language: English / Race: White Male  H&P Date: 08/02/15  Chief Complaint: Left knee pain  History of Present Illness  The patient is a 60 year old male who comes in today for a preoperative History and Physical. The patient is scheduled for a left total knee arthroplasty to be performed by Dr. Johnn Hai, MD at [facility] on 08/05/2015. [name] reports chronic bilateral knee pain, left worse than right, for many years. Prior hx of arthroscopy. Pain is refractory to activity modifications, relative rest, quad strengthening, assistive devices, medications, steroid injections and viscosupplementation.  Dr. Tonita Cong and the patient mutually agreed to proceed with a Left total knee replacement. Risks and benefits of the procedure were discussed including stiffness, suboptimal range of motion, persistent pain, infection requiring removal of prosthesis and reinsertion, need for prophylactic antibiotics in the future, for example, dental procedures, possible need for manipulation, revision in the future and also anesthetic complications including DVT, PE, etc. We discussed the perioperative course, time in the hospital, postoperative recovery and the need for elevation to control swelling. We also discussed the predicted range of motion and the probability that squatting and kneeling would be unobtainable in the future. In addition, postoperative anticoagulation was discussed. We have obtained preoperative medical clearance as necessary- Dr. Melford Aase and Dr. Martinique. Provided her illustrated handout and discussed it in detail. They will enroll in the total joint replacement educational forum at the hospital.  Problem List/Past Medical Hx Unilateral primary osteoarthritis, right knee (M17.11) Hypercholesterolemia  High blood pressure  Sleep Apnea  uses CPAP Diabetes Mellitus, Type II  Gastroesophageal Reflux Disease  Urinary Incontinence    Allergies No Known Drug Allergies [12/30/2010]: Allergies Reconciled   Family History  Congestive Heart Failure  mother Diabetes Mellitus  sister Depression  child Cancer  father Cerebrovascular Accident  mother and grandmother mothers side Heart Disease  grandmother mothers side Osteoporosis  mother and grandmother mothers side Kidney disease  child Hypertension  mother and sister First Degree Relatives   Social History  Tobacco use  never smoker Advance Directives  none Post-Surgical Plans  home with HHPT Living situation  Lives with spouse. one level home with one step to enter Alcohol use  Never consumed alcohol. Children  2 sons Marital status  Married.  Medication History Meloxicam (15MG  Tablet, Oral) Active. Allegra Allergy (Oral) Specific strength unknown - Active. Mucinex Allergy (180MG  Tablet, Oral) Active. Invokana (Oral) Specific strength unknown - Active. Onglyza (Oral) Specific strength unknown - Active. Magnesium Gluconate (Oral) Specific strength unknown - Active. Aspirin EC (81MG  Tablet DR, Oral) Active. Multivitamin (Oral) Active. Allopurinol (Oral) Specific strength unknown - Active. Carbidopa-Levodopa (25-250MG  Tablet, Oral) Active. Acyclovir (400MG  Tablet, Oral) Active. (PRN) Cymbalta (60MG  Capsule DR Part, Oral) Active. Enalapril Maleate (20MG  Tablet, Oral) Active. Gabapentin (300MG  Capsule, Oral) Active. MetFORMIN HCl (1000MG  Tablet, Oral) Active. Nasonex (50MCG/ACT Suspension, Nasal) Active. (PRN) Vitamin D (Ergocalciferol) (50000UNIT Capsule, Oral) Active. Pantoprazole Sodium (40MG  Tablet DR, Oral) Active. Fish Oil (Oral) Specific strength unknown - Active. Colcrys (0.6MG  Tablet, Oral) Active. Medications Reconciled  Past Surgical History Neck Disc Surgery  posterior fusion 06/2010 Anal Fissure Repair  2004 Sinus Surgery  1990s Straighten Nasal Septum  Colon Polyp Removal - Colonoscopy   Tonsillectomy  1990s Prostate Surgery - Removal  Laparoscopic. robot-assisted Other Surgery  wisdom tooth extraction Catheterization, Right & Left Heart  with coronary angiogram Spinal Fusion - Lower Back  05/2014 Arthroscopic Knee Surgery - Left  12/2014 Dr.  Beane  Review of Systems General Not Present- Chills, Fatigue, Fever, Memory Loss, Night Sweats, Weight Gain and Weight Loss. Skin Not Present- Eczema, Hives, Itching, Lesions and Rash. HEENT Not Present- Dentures, Double Vision, Headache, Hearing Loss, Tinnitus and Visual Loss. Respiratory Present- Shortness of breath with exertion. Not Present- Allergies, Chronic Cough, Coughing up blood and Shortness of breath at rest. Cardiovascular Not Present- Chest Pain, Difficulty Breathing Lying Down, Murmur, Palpitations, Racing/skipping heartbeats and Swelling. Gastrointestinal Not Present- Abdominal Pain, Bloody Stool, Constipation, Diarrhea, Difficulty Swallowing, Heartburn, Jaundice, Loss of appetitie, Nausea and Vomiting. Male Genitourinary Present- Incontinence. Not Present- Blood in Urine, Discharge, Flank Pain, Painful Urination, Urgency, Urinary frequency, Urinary Retention, Urinating at Night and Weak urinary stream. Musculoskeletal Present- Back Pain and Joint Pain. Not Present- Joint Swelling, Morning Stiffness, Muscle Pain, Muscle Weakness and Spasms. Neurological Present- Difficulty with balance. Not Present- Blackout spells, Dizziness, Paralysis, Tremor and Weakness. Psychiatric Not Present- Insomnia.  Physical Exam  General Mental Status -Alert, cooperative and good historian. General Appearance-pleasant, Not in acute distress. Orientation-Oriented X3. Build & Nutrition-Obese. Gait-Use of assistive device(cane) and Antalgic.  Head and Neck Head-normocephalic, atraumatic . Neck Global Assessment - supple, no bruit auscultated on the right, no bruit auscultated on the left.  Eye Pupil -  Bilateral-Regular and Round. Motion - Bilateral-EOMI.  Chest and Lung Exam Auscultation Breath sounds - clear at anterior chest wall and clear at posterior chest wall. Adventitious sounds - No Adventitious sounds.  Cardiovascular Auscultation Rhythm - Regular rate and rhythm. Heart Sounds - S1 WNL and S2 WNL. Murmurs & Other Heart Sounds - Auscultation of the heart reveals - No Murmurs.  Abdomen Palpation/Percussion Tenderness - Abdomen is non-tender to palpation. Rigidity (guarding) - Abdomen is soft. Auscultation Auscultation of the abdomen reveals - Bowel sounds normal.  Male Genitourinary Not done, not pertinent to present illness  Musculoskeletal On exam, he is walking with an antalgic gait. Moderate distress. Exquisitely tender within the medial joint line and has patellofemoral pain with compression and crepitus. Range is -5 to 110. Ipsilateral hip and ankle exam is unremarkable.  Imaging X-rays repeat, AP, lateral, Merchant demonstrates bone-on-bone arthrosis, particularly the patellofemoral and sulcus. He has progressive medial joint space narrowing, some degenerative change in the lateral compartment.  Assessment & Plan Unilateral primary osteoarthritis, left knee (M17.12)  Pt with end-stage Left knee DJD, bone-on-bone, refractory to conservative tx, scheduled for Left total knee replacement by Dr. Tonita Cong on 08/05/15. We again discussed the procedure itself as well as risks, complications and alternatives, including but not limited to DVT, PE, infx, bleeding, failure of procedure, need for secondary procedure including manipulation, nerve injury, ongoing pain/symptoms, anesthesia risk, even stroke or death. Also discussed typical post-op protocols, activity restrictions, need for PT, flexion/extension exercises, time out of work. Discussed need for DVT ppx post-op with Xarelto then ASA per protocol. Discussed dental ppx. Also discussed limitations post-operatively such as  kneeling and squatting. All questions were answered. Patient desires to proceed with surgery as scheduled. Will hold supplements, ASA, and NSAIDs accordingly. Will remain NPO after MN night before surgery. Has been instructed on what medications to take AM of surgery. Has already completed his pre-op testing at [facility]. Plan Xarelto 2 weeks post-op for DVT ppx then ASA. Plan Norco (Oxy has not been well tolerated in the past - confusion), Robaxin, Colace, Miralax. Plan home with HHPT post-op with family members at home for assistance. Will follow up 10-14 days post-op for staple removal and xrays.  Plan Left total  knee replacement  Signed electronically by Lacie Draft, PA-C for Dr. Tonita Cong

## 2015-08-05 NOTE — Brief Op Note (Signed)
08/05/2015  12:16 PM  PATIENT:  Jason Wyatt  60 y.o. male  PRE-OPERATIVE DIAGNOSIS:  DJD LEFT KNEE  POST-OPERATIVE DIAGNOSIS:  DJD LEFT KNEE  PROCEDURE:  Procedure(s): LEFT TOTAL KNEE ARTHROPLASTY (Left)  SURGEON:  Surgeon(s) and Role:    * Susa Day, MD - Primary  PHYSICIAN ASSISTANT:   ASSISTANTS: Bissell   ANESTHESIA:   general  EBL:  Total I/O In: 2000 [I.V.:2000] Out: 300 [Urine:250; Blood:50]  BLOOD ADMINISTERED:none  DRAINS: none   LOCAL MEDICATIONS USED:  MARCAINE     SPECIMEN:  No Specimen  DISPOSITION OF SPECIMEN:  N/A  COUNTS:  YES  TOURNIQUET:   Total Tourniquet Time Documented: Thigh (Left) - 52 minutes Total: Thigh (Left) - 52 minutes   DICTATION: .Other Dictation: Dictation Number [id number]  PLAN OF CARE: Admit to inpatient   PATIENT DISPOSITION:  PACU - hemodynamically stable.   Delay start of Pharmacological VTE agent (>24hrs) due to surgical blood loss or risk of bleeding: no

## 2015-08-05 NOTE — Anesthesia Postprocedure Evaluation (Signed)
Anesthesia Post Note  Patient: Jason Wyatt  Procedure(s) Performed: Procedure(s) (LRB): LEFT TOTAL KNEE ARTHROPLASTY (Left)  Patient location during evaluation: PACU Anesthesia Type: Spinal Level of consciousness: oriented and awake and alert Pain management: pain level controlled Vital Signs Assessment: post-procedure vital signs reviewed and stable Respiratory status: spontaneous breathing, respiratory function stable and patient connected to nasal cannula oxygen Cardiovascular status: blood pressure returned to baseline and stable Postop Assessment: no headache, no backache, patient able to bend at knees, spinal receding and no signs of nausea or vomiting Anesthetic complications: no    Last Vitals:  Filed Vitals:   08/05/15 1236  BP: 117/76  Pulse: 83    Last Pain: There were no vitals filed for this visit.               Catalina Gravel

## 2015-08-05 NOTE — Op Note (Signed)
NAMEKARLAN, MASTALSKI NO.:  [id number]  MEDICAL RECORD NO.:  [id number]  LOCATION:  [facility]                         FACILITY:  [facility]  PHYSICIAN:  Susa Day, M.D.    DATE OF BIRTH:  [birth year]  DATE OF PROCEDURE:  08/05/2015 DATE OF DISCHARGE:                              OPERATIVE REPORT   PREOPERATIVE DIAGNOSIS:  End-stage osteoarthrosis of the left knee.  POSTOPERATIVE DIAGNOSIS:  End-stage osteoarthrosis of the left knee.  PROCEDURE PERFORMED:  Left total knee arthroplasty utilizing DePuy ATTUNE.  The patient had 8 femur, 8 tibia, 5 insert, 38 patella.  HISTORY:  A 60 year old, bone-on-bone arthrosis, left knee, particularly at patellofemoral joint into the sulcus and medial compartment, refractory to rest activity modification, corticosteroid injections, viscosupplementation, and physical therapy.  He had negative effect to his activities of daily living, requiring pain medicine, antalgic gait. He was indicated for the replacement of the degenerated joint failing conservative treatment.  Risk and benefits were discussed including bleeding, infection, damage to neurovascular structures, DVT, PE, anesthetic complications, suboptimal range of motion, etc.  TECHNIQUE:  With the patient in supine position, after the induction of adequate General anesthesia and 3 g of Kefzol, left lower extremity was prepped, draped, and exsanguinated in usual sterile fashion.  Thigh tourniquet inflated to 300 mmHg.  Midline incision was made over the knee.  Full-thickness flaps developed.  Median parapatellar arthrotomy performed.  Patella everted and knee flexed.  Tricompartmental bone-on- bone arthrosis was noted, medial and lateral compartment, femoral sulcus and of the patella.  Fairly tight.  We elevated the soft tissues medially and preserving the MCL.  Remnants of medial and lateral menisci and the ACL were removed as well as osteophytes tricompartmental including the  patella.  Step drill was utilized and the femoral canal was irrigated.  A 5-degree left 10 off the distal femur was utilized. We pinned this and performed a distal femoral cut.  We then subluxed the tibia, was somewhat tight.  Relaxed PCL, external alignment guide, 4 off the defect, which was medial.  Bisecting the tibiotalar joint in line with the second ray parallel to the shaft, 3 degrees slope.  This was then pinned.  We performed an oscillating saw, performed a proximal tibial cut.  We then checked our extension gap and it was straight with a 5.  We then flexed the knee.  We turned our attention back towards the femur with a femoral sizing guide, 3 degrees of external rotation, sized it to an 8.  This was then pinned.  Performed the anterior, posterior and chamfer cuts, now notching the cortex occurred.  We then performed our box cut, centering the box cutting guide.  Next, subluxed the tibia, was measured to an 8.  We placed at distal medial aspect of the tibial tubercle, pinned it, drilled and placed our punch guide.  We then placed a trial femur and tibia, drilled our lug holes with the 5 insert and reduced it and had full extension, full flexion, good stability with varus and valgus stressing 0-30 degrees.  Negative anterior drawer. Turned attention towards the patella, was everted and measured to a 25, planed to a 15, utilizing  the patellar jig and oscillating saw.  We then measured after the cut, which was a 15.  We then sized it to a 38, drilled our peg holes, medialized and then placed a trial patella, reduced it, and had excellent patellofemoral tracking.  All trials were removed.  We checked posteriorly, removed remnants of the PCL, medial and lateral menisci, cauterized the geniculates.  Popliteus intact. Flexed the knee, subluxed it.  We had protected the soft tissues at all times posteriorly with a curved Crego.  We copiously irrigated with pulsatile lavage.  Cleaned all  surfaces, thoroughly dried and mixed cement on the back table, injected in the proximal tibia and digitally pressurizing the canal.  We impacted the 8 tibial tray, redundant cement removed.  We impacted and cemented the 8 femur, redundant cement removed.  Placed a 5 insert, reduced it and held an axial load throughout the curing of the cement.  We cemented and clamped the patella as well.  After full curing of the cement, the tourniquet was released at 55 minutes.  Cauterized any blood vessels, we had injected the periarticular tissues with 0.25% Marcaine with epinephrine.  Knee was then flexed.  So, we meticulously removed any redundant cement with an osteotome.  Subluxed the tibia.  Thoroughly cleaned the surface with pulsatile lavage and antibiotic irrigation.  Inserted the tibial tray 5, reduced it and we had full extension, full flexion, good stability, varus and valgus stressing at 0 and 30 degrees, negative anterior drawer.  Bone wax placed on the cancellous surfaces and slight flexion. We reapproximated the patellar arthrotomy with 1 Vicryl, then oversewn with running [place].  Copious irrigation of the subcutaneous tissue, 2-0 Vicryl simple sutures and staples and had flexion to gravity at 90 degrees.  Full flexion, full extension, good stability, varus and valgus stressing 0-30 degrees.  Negative anterior drawer.  Sterile dressing applied.  Placed an immobilizer and transported to the recovery in satisfactory condition.  The patient tolerated the procedure well.  No complications.  Assistant, Cleophas Dunker, [place].  Again, anesthesia was general.     Susa Day, M.D.     JB/MEDQ  D:  08/05/2015  T:  08/05/2015  Job:  [id number]

## 2015-08-05 NOTE — Interval H&P Note (Signed)
History and Physical Interval Note:  08/05/2015 10:10 AM  Jason Wyatt  has presented today for surgery, with the diagnosis of DJD LEFT KNEE  The various methods of treatment have been discussed with the patient and family. After consideration of risks, benefits and other options for treatment, the patient has consented to  Procedure(s): LEFT TOTAL KNEE ARTHROPLASTY (Left) as a surgical intervention .  The patient's history has been reviewed, patient examined, no change in status, stable for surgery.  I have reviewed the patient's chart and labs.  Questions were answered to the patient's satisfaction.     Shandie Bertz C

## 2015-08-05 NOTE — Progress Notes (Signed)
Pt has CPAP machine from home and wishes to utilize tonight.  RT inspected machine for damaged or frayed cords, none were present.  Machine in proper working order at this time.  Pt states that he will self administer when ready for bed.  RT to monitor and assess as needed.

## 2015-08-06 ENCOUNTER — Encounter (HOSPITAL_COMMUNITY): Payer: Self-pay | Admitting: Specialist

## 2015-08-06 LAB — GLUCOSE, CAPILLARY
Glucose-Capillary: 219 mg/dL — ABNORMAL HIGH (ref 65–99)
Glucose-Capillary: 176 mg/dL — ABNORMAL HIGH (ref 65–99)
Glucose-Capillary: 189 mg/dL — ABNORMAL HIGH (ref 65–99)
Glucose-Capillary: 207 mg/dL — ABNORMAL HIGH (ref 65–99)

## 2015-08-06 LAB — CBC
HCT: 41.6 % (ref 39.0–52.0)
Hemoglobin: 13.5 g/dL (ref 13.0–17.0)
MCH: 28.5 pg (ref 26.0–34.0)
MCHC: 32.5 g/dL (ref 30.0–36.0)
MCV: 87.9 fL (ref 78.0–100.0)
Platelets: 241 K/uL (ref 150–400)
RBC: 4.73 MIL/uL (ref 4.22–5.81)
RDW: 16.4 % — ABNORMAL HIGH (ref 11.5–15.5)
WBC: 10.5 K/uL (ref 4.0–10.5)

## 2015-08-06 LAB — BASIC METABOLIC PANEL
Anion gap: 8 (ref 5–15)
BUN: 13 mg/dL (ref 6–20)
CO2: 27 mmol/L (ref 22–32)
Calcium: 9.2 mg/dL (ref 8.9–10.3)
Chloride: 98 mmol/L — ABNORMAL LOW (ref 101–111)
Creatinine, Ser: 1.11 mg/dL (ref 0.61–1.24)
GFR calc Af Amer: >60 mL/min (ref >60)
GFR calc non Af Amer: >60 mL/min (ref >60)
Glucose, Bld: 198 mg/dL — ABNORMAL HIGH (ref 65–99)
Potassium: 4.5 mmol/L (ref 3.5–5.1)
Sodium: 133 mmol/L — ABNORMAL LOW (ref 135–145)

## 2015-08-06 NOTE — Progress Notes (Signed)
Physical Therapy Treatment Patient Details Name: Jason Wyatt MRN: [id number] DOB: [birth year] Today's Date: 08/06/2015    History of Present Illness Pt s/p L TKR with hx of DM, CKD, back surgery, prostate CA and peripheral neuropathy    PT Comments    Marked improvement in activity tolerance vs am session.  Increased time and assist for all tasks 2* pt body habitus.  Follow Up Recommendations  Home health PT     Equipment Recommendations  None recommended by PT    Recommendations for Other Services OT consult     Precautions / Restrictions Precautions Precautions: Knee;Fall Required Braces or Orthoses: Knee Immobilizer - Left Knee Immobilizer - Left: Discontinue once straight leg raise with < 10 degree lag Restrictions Weight Bearing Restrictions: No LLE Weight Bearing: Weight bearing as tolerated    Mobility  Bed Mobility Overal bed mobility: Needs Assistance Bed Mobility: Supine to Sit     Supine to sit: Mod assist;+2 for physical assistance     General bed mobility comments: assist for leg and trunk  Transfers Overall transfer level: Needs assistance Equipment used: Rolling walker (2 wheeled) Transfers: Sit to/from Stand Sit to Stand: Min assist;+2 physical assistance;From elevated surface         General transfer comment: cues for UE/LE placement  Ambulation/Gait Ambulation/Gait assistance: Min assist;+2 physical assistance Ambulation Distance (Feet): 51 Feet Assistive device: Rolling walker (2 wheeled) Gait Pattern/deviations: Step-to pattern;Decreased step length - right;Decreased step length - left;Shuffle;Trunk flexed Gait velocity: decr   General Gait Details: cues for sequence, posture and position from RW.  Increased time but no c/o dizziness   Stairs            Wheelchair Mobility    Modified Rankin (Stroke Patients Only)       Balance                                    Cognition Arousal/Alertness:  Awake/alert Behavior During Therapy: WFL for tasks assessed/performed Overall Cognitive Status: Within Functional Limits for tasks assessed                      Exercises      General Comments        Pertinent Vitals/Pain Pain Assessment: 0-10 Pain Score: 4  Faces Pain Scale: Hurts a little bit Pain Location: L knee Pain Descriptors / Indicators: Aching;Sore Pain Intervention(s): Limited activity within patient's tolerance;Monitored during session;Premedicated before session    Home Living Family/patient expects to be discharged to:: Private residence Living Arrangements: Spouse/significant other           Home Equipment: Environmental consultant - 2 wheels;Bedside commode Additional Comments: may not have bucket or splash guard for [facility]    Prior Function Level of Independence: Independent          PT Goals (current goals can now be found in the care plan section) Acute Rehab PT Goals Patient Stated Goal: Regain IND PT Goal Formulation: With patient Time For Goal Achievement: 08/12/15 Potential to Achieve Goals: Good Progress towards PT goals: Progressing toward goals    Frequency  7X/week    PT Plan Current plan remains appropriate    Co-evaluation PT/OT/SLP Co-Evaluation/Treatment: Yes Reason for Co-Treatment: For patient/therapist safety PT goals addressed during session: Mobility/safety with mobility OT goals addressed during session: ADL's and self-care     End of Session Equipment Utilized During Treatment: Gait belt;Left knee  immobilizer Activity Tolerance: Patient tolerated treatment well Patient left: in bed;with call bell/phone within reach;with family/visitor present     Time: [id number] PT Time Calculation (min) (ACUTE ONLY): 38 min  Charges:  $Gait Training: 23-37 mins                    G Codes:      Cerise Lieber 08-22-15, 4:40 PM

## 2015-08-06 NOTE — Evaluation (Signed)
Physical Therapy Evaluation Patient Details Name: Jason Wyatt MRN: [id number] DOB: [birth year] Today's Date: 08/06/2015   History of Present Illness  Pt s/p L TKR with hx of DM, CKD, back surgery, prostate CA and peripheral neuropathy  Clinical Impression  Pt s/p L TKR presents with decreased L LE strength/ROM, post op pain, obesity and dizziness with OOB activity.  Pt should progress to dc home with family assist and HHPT follow up.    Follow Up Recommendations Home health PT    Equipment Recommendations  None recommended by PT    Recommendations for Other Services OT consult     Precautions / Restrictions Precautions Precautions: Knee;Fall Restrictions Weight Bearing Restrictions: No LLE Weight Bearing: Weight bearing as tolerated      Mobility  Bed Mobility Overal bed mobility: Needs Assistance Bed Mobility: Supine to Sit     Supine to sit: Mod assist     General bed mobility comments: cues for sequence and use of R LE to self assist.  Pt utilizing rails to self assist  Transfers Overall transfer level: Needs assistance Equipment used: Rolling walker (2 wheeled) Transfers: Sit to/from Stand Sit to Stand: Min assist;Mod assist;+2 physical assistance;+2 safety/equipment;From elevated surface         General transfer comment: cues for LE management and use of UEs to self assist  Ambulation/Gait Ambulation/Gait assistance: Min assist;+2 physical assistance;+2 safety/equipment Ambulation Distance (Feet): 4 Feet Assistive device: Rolling walker (2 wheeled) Gait Pattern/deviations: Step-to pattern;Decreased step length - right;Decreased step length - left;Shuffle;Trunk flexed Gait velocity: decr Gait velocity interpretation: Below normal speed for age/gender General Gait Details: cues for sequence, posture and position from RW.  LTd by fatigue and c/o increased dizziness  Stairs            Wheelchair Mobility    Modified Rankin (Stroke Patients Only)       Balance                                             Pertinent Vitals/Pain Pain Assessment: 0-10 Pain Score: 6  Pain Location: L knee Pain Descriptors / Indicators: Aching;Sore Pain Intervention(s): Limited activity within patient's tolerance;Monitored during session;Premedicated before session;Ice applied    Home Living Family/patient expects to be discharged to:: Private residence Living Arrangements: Spouse/significant other Available Help at Discharge: Family Type of Home: House Home Access: Stairs to enter   Technical brewer of Steps: 1 Home Layout: One level Home Equipment: Environmental consultant - 2 wheels;Bedside commode      Prior Function Level of Independence: Independent               Hand Dominance        Extremity/Trunk Assessment   Upper Extremity Assessment: Overall WFL for tasks assessed           Lower Extremity Assessment: LLE deficits/detail   LLE Deficits / Details: 2/5 quads with AAROM at knee -10- 50  Cervical / Trunk Assessment: Normal  Communication   Communication: No difficulties  Cognition Arousal/Alertness: Awake/alert Behavior During Therapy: WFL for tasks assessed/performed Overall Cognitive Status: Within Functional Limits for tasks assessed                      General Comments      Exercises Total Joint Exercises Ankle Circles/Pumps: AROM;15 reps;Supine;Both Quad Sets: AROM;Both;10 reps;Supine Heel Slides: AAROM;Left;15 reps;Supine Straight Leg  Raises: AAROM;Left;10 reps;Supine      Assessment/Plan    PT Assessment Patient needs continued PT services  PT Diagnosis Difficulty walking   PT Problem List Decreased strength;Decreased range of motion;Decreased activity tolerance;Decreased balance;Decreased mobility;Decreased knowledge of use of DME;Obesity;Pain  PT Treatment Interventions DME instruction;Gait training;Stair training;Functional mobility training;Therapeutic  activities;Therapeutic exercise;Patient/family education   PT Goals (Current goals can be found in the Care Plan section) Acute Rehab PT Goals Patient Stated Goal: Regain IND PT Goal Formulation: With patient Time For Goal Achievement: 08/12/15 Potential to Achieve Goals: Good    Frequency 7X/week   Barriers to discharge        Co-evaluation               End of Session Equipment Utilized During Treatment: Gait belt;Left knee immobilizer Activity Tolerance: Patient limited by fatigue;Other (comment) (dizziness with OOB activity) Patient left: in chair;with family/visitor present Nurse Communication: Mobility status         Time: [id number] PT Time Calculation (min) (ACUTE ONLY): 38 min   Charges:   PT Evaluation $PT Eval Low Complexity: 1 Procedure PT Treatments $Gait Training: 8-22 mins $Therapeutic Exercise: 8-22 mins   PT G Codes:        Graelyn Bihl 08-27-15, 12:32 PM

## 2015-08-06 NOTE — Progress Notes (Signed)
Patient ID: Jason Wyatt, male   DOB: 1955/07/20, 60 y.o.   MRN: [id number] Subjective: 1 Day Post-Op Procedure(s) (LRB): LEFT TOTAL KNEE ARTHROPLASTY (Left) Patient reports pain as mild.    Patient has complaints of L knee and thigh pain  We will start therapy today. Plan is to go home with HHPT after hospital stay.  Objective: Vital signs in last 24 hours: Temp:  [97.6 F (36.4 C)-98.8 F (37.1 C)] 98.3 F (36.8 C) (07/21 [id number]) Pulse Rate:  [64-97] 94 (07/21 0613) Resp:  [5-23] 16 (07/21 0613) BP: (102-149)/(62-91) 130/84 mmHg (07/21 0613) SpO2:  [97 %-100 %] 100 % (07/21 0613) Weight:  [142.429 kg (314 lb)] 142.429 kg (314 lb) (07/20 0823)  Intake/Output from previous day:  Intake/Output Summary (Last 24 hours) at 08/06/15 0755 Last data filed at 08/06/15 0600  Gross per 24 hour  Intake   5180 ml  Output   3350 ml  Net   1830 ml    Intake/Output this shift:    Labs: Results for orders placed or performed during the hospital encounter of 08/05/15  Glucose, capillary  Result Value Ref Range   Glucose-Capillary 156 (H) 65 - 99 mg/dL  Glucose, capillary  Result Value Ref Range   Glucose-Capillary 100 (H) 65 - 99 mg/dL   Comment 1 Notify RN    Comment 2 Document in Chart   Glucose, capillary  Result Value Ref Range   Glucose-Capillary 139 (H) 65 - 99 mg/dL  CBC  Result Value Ref Range   WBC 10.5 4.0 - 10.5 K/uL   RBC 4.73 4.22 - 5.81 MIL/uL   Hemoglobin 13.5 13.0 - 17.0 g/dL   HCT 41.6 39.0 - 52.0 %   MCV 87.9 78.0 - 100.0 fL   MCH 28.5 26.0 - 34.0 pg   MCHC 32.5 30.0 - 36.0 g/dL   RDW 16.4 (H) 11.5 - 15.5 %   Platelets 241 150 - 400 K/uL  Basic metabolic panel  Result Value Ref Range   Sodium 133 (L) 135 - 145 mmol/L   Potassium 4.5 3.5 - 5.1 mmol/L   Chloride 98 (L) 101 - 111 mmol/L   CO2 27 22 - 32 mmol/L   Glucose, Bld 198 (H) 65 - 99 mg/dL   BUN 13 6 - 20 mg/dL   Creatinine, Ser 1.11 0.61 - 1.24 mg/dL   Calcium 9.2 8.9 - 10.3 mg/dL   GFR calc non  Af Amer >60 >60 mL/min   GFR calc Af Amer >60 >60 mL/min   Anion gap 8 5 - 15  Glucose, capillary  Result Value Ref Range   Glucose-Capillary 211 (H) 65 - 99 mg/dL  Glucose, capillary  Result Value Ref Range   Glucose-Capillary 219 (H) 65 - 99 mg/dL    Exam - Neurologically intact ABD soft Neurovascular intact Sensation intact distally Intact pulses distally Dorsiflexion/Plantar flexion intact Incision: dressing C/D/I and no drainage No cellulitis present Compartment soft no sign of DVT Dressing - clean, dry, no drainage Motor function intact - moving foot and toes well on exam.   Assessment/Plan: 1 Day Post-Op Procedure(s) (LRB): LEFT TOTAL KNEE ARTHROPLASTY (Left)  Advance diet Up with therapy D/C IV fluids Past Medical History  Diagnosis Date  . Gout     takes Allopurinol daily and Colchicine if needed  . Urinary frequency     takes Flomax daily  . GERD (gastroesophageal reflux disease)     takes Prevacid daily  . Thyroid nodule  left side  . Chronic back pain     stenosis  . Hypertension   . OSA on CPAP   . Type II diabetes mellitus ([place])   . Peripheral neuropathy ([place])   . History of hiatal hernia   . History of Barrett's esophagus   . History of adenomatous polyp of colon     tubular adenoma's  . Urine incontinence   . Prostate cancer [facility]) urologist-  dr Alinda Money    dx 2013  s/p  radical non-nerve sparing prostatectomy 2014--  Stage pT2cNx,  PSA 3.48,  Gleason 3+3,  vol 55cc/  current PSA  0.01 (Aug 2016)  . Incomplete right bundle branch block (RBBB)   . Hyperlipidemia   . CKD (chronic kidney disease), stage II   . Acute meniscal tear of left knee   . Arthritis      back  . OA (osteoarthritis)     knee  . Complication of anesthesia cervical fusion surgery 06/2010    "woke up next day w/ventilator on" told due to OSA  . Borderline glaucoma   . Wears glasses   . Allergic rhinitis     DVT Prophylaxis - Xarelto Protocol Weight-Bearing as  tolerated to Left leg No vaccines. Hold CPM today due to increased pain yesterday Up with PT today Plan D/C home with HHPT when ready, tomorrow vs Sunday depending on pain and progress with PT Will discuss with Dr. Mliss Fritz, [place]. 08/06/2015, 7:55 AM

## 2015-08-06 NOTE — Progress Notes (Signed)
Inpatient Diabetes Program Recommendations  AACE/ADA: New Consensus Statement on Inpatient Glycemic Control (2015)  Target Ranges:  Prepandial:   less than 140 mg/dL      Peak postprandial:   less than 180 mg/dL (1-2 hours)      Critically ill patients:  140 - 180 mg/dL   Lab Results  Component Value Date   GLUCAP 219* 08/06/2015   HGBA1C 7.4* 07/28/2015   Results for CLEO, BYRON (MRN [id number]) as of 08/06/2015 09:51  Ref. Range 08/05/2015 22:21 08/06/2015 06:48  Glucose-Capillary Latest Ref Range: 65-99 mg/dL 211 (H) 219 (H)   Review of Glycemic Control  Diabetes history: DM2 Outpatient Diabetes medications: Onglyza 5 mg QD, metformin 1000 mg bid, Invokana 300 mg QAM Current orders for Inpatient glycemic control: Novolog moderate tidwc, Invokana 300 mg QAM  Inpatient Diabetes Program Recommendations:    Increase Novolog to resistant tidwc and hs  Will continue to follow. Thank you. Lorenda Peck, RD, LDN, CDE Inpatient Diabetes Coordinator [phone]

## 2015-08-06 NOTE — Evaluation (Signed)
Occupational Therapy Evaluation Patient Details Name: Jason Wyatt MRN: [id number] DOB: [birth year] Today's Date: 08/06/2015    History of Present Illness Pt s/p L TKR with hx of DM, CKD, back surgery, prostate CA and peripheral neuropathy   Clinical Impression   This 60 year old man was admitted for the above sx.  Will follow in acute setting with min A level goals.  He will have 24/7 and assist at home.    Follow Up Recommendations  Supervision/Assistance - 24 hour    Equipment Recommendations   (has 3:1 but it may be missing either bucket or splash guard)    Recommendations for Other Services       Precautions / Restrictions Precautions Precautions: Knee;Fall Restrictions LLE Weight Bearing: Weight bearing as tolerated      Mobility Bed Mobility         Supine to sit: Mod assist;+2 for physical assistance     General bed mobility comments: assist for leg and trunk  Transfers   Equipment used: Rolling walker (2 wheeled) Transfers: Sit to/from Stand Sit to Stand: Min assist;+2 physical assistance;From elevated surface         General transfer comment: cues for UE/LE placement    Balance                                            ADL Overall ADL's : Needs assistance/impaired     Grooming: Set up;Sitting   Upper Body Bathing: Set up;Sitting   Lower Body Bathing: Moderate assistance;Sit to/from stand;+2 for safety/equipment   Upper Body Dressing : Minimal assistance;Sitting   Lower Body Dressing: Maximal assistance;+2 for safety/equipment;Sit to/from stand   Toilet Transfer: Minimal assistance;+2 for safety/equipment;Ambulation;RW (back to bed)   Toileting- Clothing Manipulation and Hygiene: Minimal assistance;+2 for safety/equipment;Sit to/from stand (and also used urinal)         General ADL Comments: pt will have assistance for adls. He does have a reacher at home.  Educated on tub DME--he understands it is Out of pocket  and may wash at sink.  Educated on 3:1 in tub facing out from long side of tub and supporting leg outside of tub and cutting inexpensive shower curtain liner to keep water in. Son present     Vision     Perception     Praxis      Pertinent Vitals/Pain Pain Assessment: Faces Faces Pain Scale: Hurts a little bit Pain Location: L knee Pain Descriptors / Indicators: Aching Pain Intervention(s): Limited activity within patient's tolerance;Monitored during session;Premedicated before session;Repositioned;Ice applied     Hand Dominance     Extremity/Trunk Assessment             Communication Communication Communication: No difficulties   Cognition Arousal/Alertness: Awake/alert Behavior During Therapy: WFL for tasks assessed/performed Overall Cognitive Status: Within Functional Limits for tasks assessed                     General Comments       Exercises       Shoulder Instructions      Home Living Family/patient expects to be discharged to:: Private residence Living Arrangements: Spouse/significant other                 Bathroom Shower/Tub: Tub/shower unit Shower/tub characteristics: Curtain Biochemist, clinical: Standard     Home Equipment: Environmental consultant - 2 wheels;Bedside  commode   Additional Comments: may not have bucket or splash guard for BSC      Prior Functioning/Environment Level of Independence: Independent             OT Diagnosis: Acute pain   OT Problem List: Decreased strength;Decreased activity tolerance;Decreased knowledge of use of DME or AE;Pain   OT Treatment/Interventions: Self-care/ADL training;DME and/or AE instruction;Patient/family education;Balance training    OT Goals(Current goals can be found in the care plan section) Acute Rehab OT Goals Patient Stated Goal: Regain IND OT Goal Formulation: With patient Time For Goal Achievement: 08/13/15 Potential to Achieve Goals: Good ADL Goals Pt Will Perform Lower Body Bathing:  with min assist;with adaptive equipment;sit to/from stand Pt Will Perform Lower Body Dressing: with min assist;with adaptive equipment;sit to/from stand (pants) Pt Will Transfer to Toilet: with min guard assist;ambulating;bedside commode  OT Frequency: Min 2X/week   Barriers to D/C:            Co-evaluation PT/OT/SLP Co-Evaluation/Treatment: Yes Reason for Co-Treatment: For patient/therapist safety PT goals addressed during session: Mobility/safety with mobility OT goals addressed during session: ADL's and self-care      End of Session    Activity Tolerance: Patient tolerated treatment well Patient left: in bed;with call bell/phone within reach;with bed alarm set;with family/visitor present   Time: [id number] OT Time Calculation (min): 33 min Charges:  OT General Charges $OT Visit: 1 Procedure OT Evaluation $OT Eval Low Complexity: 1 Procedure G-Codes:    Leny Morozov 08/17/15, 3:04 PM   Lesle Chris, OTR/L [phone] August 17, 2015

## 2015-08-06 NOTE — Discharge Instructions (Signed)
Elevate leg above heart 6x a day for 35minutes each Use knee immobilizer while walking until can SLR x 10 Use knee immobilizer in bed to keep knee in extension Aquacel dressing may remain in place until follow up. May shower with aquacel dressing in place. If the dressing becomes saturated or peels off, you may remove aquacel dressing. Place new dressing with gauze and tape or ACE bandage which should be kept clean and dry and changed daily.  INSTRUCTIONS AFTER JOINT REPLACEMENT   o Remove items at home which could result in a fall. This includes throw rugs or furniture in walking pathways o ICE to the affected joint every three hours while awake for 30 minutes at a time, for at least the first 3-5 days, and then as needed for pain and swelling.  Continue to use ice for pain and swelling. You may notice swelling that will progress down to the foot and ankle.  This is normal after surgery.  Elevate your leg when you are not up walking on it.   o Continue to use the breathing machine you got in the hospital (incentive spirometer) which will help keep your temperature down.  It is common for your temperature to cycle up and down following surgery, especially at night when you are not up moving around and exerting yourself.  The breathing machine keeps your lungs expanded and your temperature down.   DIET:  As you were doing prior to hospitalization, we recommend a well-balanced diet.  DRESSING / WOUND CARE / SHOWERING  Keep the surgical dressing until follow up.  The dressing is water proof, so you can shower without any extra covering.  IF THE DRESSING FALLS OFF or the wound gets wet inside, change the dressing with sterile gauze.  Please use good hand washing techniques before changing the dressing.  Do not use any lotions or creams on the incision until instructed by your surgeon.    ACTIVITY  o Increase activity slowly as tolerated, but follow the weight bearing instructions below.   o No  driving for 6 weeks or until further direction given by your physician.  You cannot drive while taking narcotics.  o No lifting or carrying greater than 10 lbs. until further directed by your surgeon. o Avoid periods of inactivity such as sitting longer than an hour when not asleep. This helps prevent blood clots.  o You may return to work once you are authorized by your doctor.     WEIGHT BEARING   Weight bearing as tolerated with assist device (walker, cane, etc) as directed, use it as long as suggested by your surgeon or therapist, typically at least 4-6 weeks.   EXERCISES  Results after joint replacement surgery are often greatly improved when you follow the exercise, range of motion and muscle strengthening exercises prescribed by your doctor. Safety measures are also important to protect the joint from further injury. Any time any of these exercises cause you to have increased pain or swelling, decrease what you are doing until you are comfortable again and then slowly increase them. If you have problems or questions, call your caregiver or physical therapist for advice.   Rehabilitation is important following a joint replacement. After just a few days of immobilization, the muscles of the leg can become weakened and shrink (atrophy).  These exercises are designed to build up the tone and strength of the thigh and leg muscles and to improve motion. Often times heat used for twenty to thirty minutes  before working out will loosen up your tissues and help with improving the range of motion but do not use heat for the first two weeks following surgery (sometimes heat can increase post-operative swelling).   These exercises can be done on a training (exercise) mat, on the floor, on a table or on a bed. Use whatever works the best and is most comfortable for you.    Use music or television while you are exercising so that the exercises are a pleasant break in your day. This will make your life  better with the exercises acting as a break in your routine that you can look forward to.   Perform all exercises about fifteen times, three times per day or as directed.  You should exercise both the operative leg and the other leg as well.  Exercises include:    Quad Sets - Tighten up the muscle on the front of the thigh (Quad) and hold for 5-10 seconds.    Straight Leg Raises - With your knee straight (if you were given a brace, keep it on), lift the leg to 60 degrees, hold for 3 seconds, and slowly lower the leg.  Perform this exercise against resistance later as your leg gets stronger.   Leg Slides: Lying on your back, slowly slide your foot toward your buttocks, bending your knee up off the floor (only go as far as is comfortable). Then slowly slide your foot back down until your leg is flat on the floor again.   Angel Wings: Lying on your back spread your legs to the side as far apart as you can without causing discomfort.   Hamstring Strength:  Lying on your back, push your heel against the floor with your leg straight by tightening up the muscles of your buttocks.  Repeat, but this time bend your knee to a comfortable angle, and push your heel against the floor.  You may put a pillow under the heel to make it more comfortable if necessary.   A rehabilitation program following joint replacement surgery can speed recovery and prevent re-injury in the future due to weakened muscles. Contact your doctor or a physical therapist for more information on knee rehabilitation.    CONSTIPATION  Constipation is defined medically as fewer than three stools per week and severe constipation as less than one stool per week.  Even if you have a regular bowel pattern at home, your normal regimen is likely to be disrupted due to multiple reasons following surgery.  Combination of anesthesia, postoperative narcotics, change in appetite and fluid intake all can affect your bowels.   YOU MUST use at least  one of the following options; they are listed in order of increasing strength to get the job done.  They are all available over the counter, and you may need to use some, POSSIBLY even all of these options:    Drink plenty of fluids (prune juice may be helpful) and high fiber foods Colace 100 mg by mouth twice a day  Senokot for constipation as directed and as needed Dulcolax (bisacodyl), take with full glass of water  Miralax (polyethylene glycol) once or twice a day as needed.  If you have tried all these things and are unable to have a bowel movement in the first 3-4 days after surgery call either your surgeon or your primary doctor.    If you experience loose stools or diarrhea, hold the medications until you stool forms back up.  If your symptoms do  not get better within 1 week or if they get worse, check with your doctor.  If you experience "the worst abdominal pain ever" or develop nausea or vomiting, please contact the office immediately for further recommendations for treatment.   ITCHING:  If you experience itching with your medications, try taking only a single pain pill, or even half a pain pill at a time.  You can also use Benadryl over the counter for itching or also to help with sleep.   TED HOSE STOCKINGS:  Use stockings on both legs until for at least 2 weeks or as directed by physician office. They may be removed at night for sleeping.  MEDICATIONS:  See your medication summary on the After Visit Summary that nursing will review with you.  You may have some home medications which will be placed on hold until you complete the course of blood thinner medication.  It is important for you to complete the blood thinner medication as prescribed.  PRECAUTIONS:  If you experience chest pain or shortness of breath - call 911 immediately for transfer to the hospital emergency department.   If you develop a fever greater that 101 F, purulent drainage from wound, increased redness or  drainage from wound, foul odor from the wound/dressing, or calf pain - CONTACT YOUR SURGEON.                                                   FOLLOW-UP APPOINTMENTS:  If you do not already have a post-op appointment, please call the office for an appointment to be seen by your surgeon.  Guidelines for how soon to be seen are listed in your After Visit Summary, but are typically between 1-4 weeks after surgery.  OTHER INSTRUCTIONS:   Knee Replacement:  Do not place pillow under knee, focus on keeping the knee straight while resting. CPM instructions: 0-90 degrees, 2 hours in the morning, 2 hours in the afternoon, and 2 hours in the evening. Place foam block, curve side up under heel at all times except when in CPM or when walking.  DO NOT modify, tear, cut, or change the foam block in any way.  MAKE SURE YOU:   Understand these instructions.   Get help right away if you are not doing well or get worse.    Thank you for letting us be a part of your medical care team.  It is a privilege we respect greatly.  We hope these instructions will help you stay on track for a fast and full recovery!   Elevate leg above heart 6x a day for 21minutes each Use knee immobilizer while walking until can SLR x 10 Use knee immobilizer in bed to keep knee in extension Aquacel dressing may remain in place until follow up. May shower with aquacel dressing in place. If the dressing becomes saturated or peels off, you may remove aquacel dressing. Do not remove steri-strips if they are present. Place new dressing with gauze and tape or ACE bandage which should be kept clean and dry and changed daily.  Information on my medicine - XARELTO (Rivaroxaban)  This medication education was reviewed with me or my healthcare representative as part of my discharge preparation.  The pharmacist that spoke with me during my hospital stay was:  Joycelyn Schmid Briannia Laba, Stu-PharmD  Why was Xarelto prescribed for you?  Xarelto was  prescribed for you to reduce the risk of blood clots forming after orthopedic surgery. The medical term for these abnormal blood clots is venous thromboembolism (VTE).  What do you need to know about xarelto ? Take your Xarelto ONCE DAILY at the same time every day. You may take it either with or without food.  If you have difficulty swallowing the tablet whole, you may crush it and mix in applesauce just prior to taking your dose.  Take Xarelto exactly as prescribed by your doctor and DO NOT stop taking Xarelto without talking to the doctor who prescribed the medication.  Stopping without other VTE prevention medication to take the place of Xarelto may increase your risk of developing a clot.  After discharge, you should have regular check-up appointments with your healthcare provider that is prescribing your Xarelto.    What do you do if you miss a dose? If you miss a dose, take it as soon as you remember on the same day then continue your regularly scheduled once daily regimen the next day. Do not take two doses of Xarelto on the same day.   Important Safety Information A possible side effect of Xarelto is bleeding. You should call your healthcare provider right away if you experience any of the following: ? Bleeding from an injury or your nose that does not stop. ? Unusual colored urine (red or dark brown) or unusual colored stools (red or black). ? Unusual bruising for unknown reasons. ? A serious fall or if you hit your head (even if there is no bleeding).  Some medicines may interact with Xarelto and might increase your risk of bleeding while on Xarelto. To help avoid this, consult your healthcare provider or pharmacist prior to using any new prescription or non-prescription medications, including herbals, vitamins, non-steroidal anti-inflammatory drugs (NSAIDs) and supplements.  This website has more information on Xarelto: [email or web address]

## 2015-08-06 NOTE — Progress Notes (Addendum)
CSW received consult for SNF placement, but reviewed H&P and noted that patient plans to return home with home health services.   CSW will sign off - please re-consult if needed.    Raynaldo Opitz, [facility] Clinical Social Worker cell #: [phone]

## 2015-08-07 LAB — GLUCOSE, CAPILLARY
Glucose-Capillary: 223 mg/dL — ABNORMAL HIGH (ref 65–99)
Glucose-Capillary: 174 mg/dL — ABNORMAL HIGH (ref 65–99)

## 2015-08-07 LAB — CBC
HCT: 39.6 % (ref 39.0–52.0)
Hemoglobin: 13 g/dL (ref 13.0–17.0)
MCH: 28.4 pg (ref 26.0–34.0)
MCHC: 32.8 g/dL (ref 30.0–36.0)
MCV: 86.5 fL (ref 78.0–100.0)
Platelets: 248 10*3/uL (ref 150–400)
RBC: 4.58 MIL/uL (ref 4.22–5.81)
RDW: 16.7 % — ABNORMAL HIGH (ref 11.5–15.5)
WBC: 11.1 10*3/uL — ABNORMAL HIGH (ref 4.0–10.5)

## 2015-08-07 MED ORDER — HYDROMORPHONE HCL 2 MG/ML IJ SOLN: 2.0000 mg | INTRAMUSCULAR | Status: DC | PRN

## 2015-08-07 NOTE — Progress Notes (Signed)
Subjective: 2 Days Post-Op Procedure(s) (LRB): LEFT TOTAL KNEE ARTHROPLASTY (Left) Patient reports pain as mild to left knee.  Tolerating PO's. Denies SOB,CP,or calf pain. Progressing with PT.  Objective: Vital signs in last 24 hours: Temp:  [97.8 F (36.6 C)-98.9 F (37.2 C)] 98.2 F (36.8 C) (07/22 0625) Pulse Rate:  [98-107] 101 (07/22 0625) Resp:  [16-20] 20 (07/22 0625) BP: (108-131)/(70-87) 108/76 mmHg (07/22 0625) SpO2:  [93 %-98 %] 98 % (07/22 0625)  Intake/Output from previous day: 07/21 0701 - 07/22 0700 In: 1177.5 [P.O.:600; I.V.:577.5] Out: 2060 [Urine:2060] Intake/Output this shift: Total I/O In: 360 [P.O.:360] Out: 375 [Urine:375]   Recent Labs  08/06/15 0443 08/07/15 0501  HGB 13.5 13.0    Recent Labs  08/06/15 0443 08/07/15 0501  WBC 10.5 11.1*  RBC 4.73 4.58  HCT 41.6 39.6  PLT 241 248    Recent Labs  08/06/15 0443  NA 133*  K 4.5  CL 98*  CO2 27  BUN 13  CREATININE 1.11  GLUCOSE 198*  CALCIUM 9.2   No results for input(s): LABPT, INR in the last 72 hours.  Alert and oriented x3. RRR, Lungs clear, BS x4. Left Calf soft and non tender. L knee dressing C/D/I. No DVT signs. No signs of infection or compartment syndrome. LLE grossly neurovascularly intact.   Assessment/Plan: 2 Days Post-Op Procedure(s) (LRB): LEFT TOTAL KNEE ARTHROPLASTY (Left) Up with PT If doing well may D/c home with family Continue current care Follow instructions F/u in office with Dr.Beane  STILWELL, BRYSON L 08/07/2015, 9:19 AM

## 2015-08-07 NOTE — Progress Notes (Signed)
Physical Therapy Treatment Patient Details Name: Jason Wyatt MRN: [id number] DOB: Nov 20, 1955 Today's Date: 08/07/2015    History of Present Illness Pt s/p L TKR with hx of DM, CKD, back surgery, prostate CA and peripheral neuropathy    PT Comments    Pt progressing steadily with mobility and eager for dc home.  Pt requires min assist for some mobility tasks but son states he is comfortable offering that level of assist.  Reviewed car transfers, stairs, therex and don/doff KI with pt and son.  Follow Up Recommendations  Home health PT     Equipment Recommendations  None recommended by PT    Recommendations for Other Services OT consult     Precautions / Restrictions Precautions Precautions: Knee;Fall Required Braces or Orthoses: Knee Immobilizer - Left Knee Immobilizer - Left: Discontinue once straight leg raise with < 10 degree lag Restrictions Weight Bearing Restrictions: No LLE Weight Bearing: Weight bearing as tolerated    Mobility  Bed Mobility Overal bed mobility: Needs Assistance Bed Mobility: Supine to Sit;Sit to Supine     Supine to sit: Min assist Sit to supine: Min assist   General bed mobility comments: assist with L LE.  Pt self assisting with bed rails  Transfers Overall transfer level: Needs assistance Equipment used: Rolling walker (2 wheeled) Transfers: Sit to/from Stand Sit to Stand: Min assist;From elevated surface         General transfer comment: cues for UE/LE placement  Ambulation/Gait Ambulation/Gait assistance: Min guard Ambulation Distance (Feet): 50 Feet Assistive device: Rolling walker (2 wheeled) Gait Pattern/deviations: Step-to pattern;Decreased step length - right;Decreased step length - left;Shuffle;Trunk flexed Gait velocity: decr Gait velocity interpretation: Below normal speed for age/gender General Gait Details: cues for sequence, posture and position from RW.  Increased time    Stairs Stairs: Yes Stairs assistance:  +2 physical assistance Stair Management: No rails;Step to pattern;Forwards;Backwards;With walker Number of Stairs: 2 General stair comments: single step twice - once fwd and once bkwd with RW and cues for sequence and foot/RW placement  Wheelchair Mobility    Modified Rankin (Stroke Patients Only)       Balance                                    Cognition Arousal/Alertness: Awake/alert Behavior During Therapy: WFL for tasks assessed/performed Overall Cognitive Status: Within Functional Limits for tasks assessed                      Exercises Total Joint Exercises Ankle Circles/Pumps: AROM;15 reps;Supine;Both Quad Sets: AROM;Both;Supine;15 reps Heel Slides: AAROM;Left;Supine;20 reps Straight Leg Raises: AAROM;Left;Supine;15 reps Goniometric ROM: AAROM L knee -10 - 55    General Comments        Pertinent Vitals/Pain Pain Assessment: 0-10 Pain Score: 4  Pain Location: L knee/thigh Pain Descriptors / Indicators: Aching;Sore Pain Intervention(s): Limited activity within patient's tolerance;Monitored during session;Premedicated before session;Ice applied    Home Living                      Prior Function            PT Goals (current goals can now be found in the care plan section) Acute Rehab PT Goals Patient Stated Goal: Regain IND PT Goal Formulation: With patient Time For Goal Achievement: 08/12/15 Potential to Achieve Goals: Good Progress towards PT goals: Progressing toward goals  Frequency  7X/week    PT Plan Current plan remains appropriate    Co-evaluation             End of Session Equipment Utilized During Treatment: Gait belt;Left knee immobilizer Activity Tolerance: Patient tolerated treatment well;Patient limited by fatigue Patient left: in bed;with call bell/phone within reach;with family/visitor present     Time: [id number] PT Time Calculation (min) (ACUTE ONLY): 58 min  Charges:  $Gait Training:  8-22 mins $Therapeutic Exercise: 23-37 mins $Therapeutic Activity: 8-22 mins                    G Codes:      Samik Balkcom 08/15/15, 1:18 PM

## 2015-08-07 NOTE — Progress Notes (Signed)
Discharged from floor via w/c for transport home by car. Belongings & family with pt. No changes in assessment. Jason Wyatt  

## 2015-08-07 NOTE — Care Management Note (Signed)
Case Management Note  Patient Details  Name: CLIF DETHOMAS MRN: [id number] Date of Birth: 1955/09/22  Subjective/Objective:                   LEFT TOTAL KNEE ARTHROPLASTY (Left) Action/Plan: Discharge planning Expected Discharge Date:  08/07/15               Expected Discharge Plan:  [place]  In-House Referral:     Discharge planning Services  CM Consult  Post Acute Care Choice:    Choice offered to:  Patient  DME Arranged:  3-N-1 DME Agency:  [place]:  PT [place] Agency:  [facility] (now [facility])  Status of Service:  Completed, signed off  If discussed at [employer] of Stay Meetings, dates discussed:    Additional Comments: CM spoke with pt to offer choice of home health agency.  Pt chooses [facility] to render HHPT.   Referral called to [employer], [place].  Cm called [place] DME rep, Jermaine to please arrange for a bari commode. Pt states understanding this B3n1 may be shipped to his address.  No other CM needs were communicated. Dellie Catholic, RN 08/07/2015, 12:09 PM

## 2015-08-07 NOTE — Discharge Summary (Signed)
Physician Discharge Summary  Patient ID: Jason Wyatt MRN: [id number] DOB/AGE: [birth year] 60 y.o.  Admit date: 08/05/2015 Discharge date: 08/07/2015  Admission Diagnoses: Left knee OA  Discharge Diagnoses:  Principal Problem:   Primary osteoarthritis of left knee Active Problems:   Left knee DJD   Discharged Condition: good  Hospital Course:  Jason Wyatt is a 60 y.o. who was admitted to [facility]. They were brought to the operating room on 08/05/2015 and underwent Procedure(s): LEFT TOTAL KNEE ARTHROPLASTY.  Patient tolerated the procedure well and was later transferred to the recovery room and then to the orthopaedic floor for postoperative care.  They were given PO and IV analgesics for pain control following their surgery.  They were given 24 hours of postoperative antibiotics of  Anti-infectives    Start     Dose/Rate Route Frequency Ordered Stop   08/05/15 1700  ceFAZolin (ANCEF) 3 g in dextrose 5 % 50 mL IVPB     3 g 130 mL/hr over 30 Minutes Intravenous Every 6 hours 08/05/15 1558 08/06/15 0445   08/05/15 1558  acyclovir (ZOVIRAX) tablet 400 mg  Status:  Discontinued     400 mg Oral Daily PRN 08/05/15 1558 08/07/15 1640   08/05/15 1150  polymyxin B 500,000 Units, bacitracin 50,000 Units in sodium chloride irrigation 0.9 % 500 mL irrigation  Status:  Discontinued       As needed 08/05/15 1150 08/05/15 1232   08/05/15 0600  ceFAZolin (ANCEF) 3 g in dextrose 5 % 50 mL IVPB     3 g 130 mL/hr over 30 Minutes Intravenous On call to O.R. 08/04/15 1330 08/05/15 1037     and started on DVT prophylaxis .   PT and OT were ordered for total joint protocol.  Discharge planning consulted to help with postop disposition and equipment needs.  Patient had a god night on the evening of surgery and started to get up OOB with therapy on day one.  Hemovac drain was pulled without difficulty.  Continued to work with therapy into day two.  Dressing was with normal limits.  The patient  had progressed with therapy and meeting their goals. Patient was seen in rounds and was ready to go home.  Consults: n/a  Significant Diagnostic Studies: routine  Treatments: routine  Discharge Exam: Blood pressure 108/76, pulse 101, temperature 98.2 F (36.8 C), temperature source Oral, resp. rate 20, height 6\' 3"  (1.905 m), weight 142.429 kg (314 lb), SpO2 98 %. Alert and oriented x3. RRR, Lungs clear, BS x4. Left Calf soft and non tender. L knee dressing C/D/I. No DVT signs. No signs of infection or compartment syndrome. LLE grossly neurovascularly intact.   Disposition: 01-Home or Self Care     Medication List    STOP taking these medications        aspirin EC 81 MG tablet     meloxicam 15 MG tablet  Commonly known as:  MOBIC     multivitamin with minerals Tabs tablet      TAKE these medications        acyclovir 400 MG tablet  Commonly known as:  ZOVIRAX  TAKE 1 BY MOUTH DAILY AS NEEDED FOR COLD SORES     allopurinol 300 MG tablet  Commonly known as:  ZYLOPRIM  TAKE 1 BY MOUTH AT BEDTIME     buPROPion 150 MG 24 hr tablet  Commonly known as:  WELLBUTRIN XL  Take 1 tablet (150 mg total) by mouth every morning.  canagliflozin 300 MG Tabs tablet  Commonly known as:  INVOKANA  TAKE 1 BY MOUTH DAILY BEFORE BREAKFAST     carbidopa-levodopa 25-250 MG tablet  Commonly known as:  SINEMET IR  TAKE 1 BY MOUTH TWICE DAILY FOR RESTLESS LEGS     colchicine 0.6 MG tablet  Take 1 tablet (0.6 mg total) by mouth 2 (two) times daily as needed (gout flare ups).     docusate sodium 100 MG capsule  Commonly known as:  COLACE  Take 1 capsule (100 mg total) by mouth 2 (two) times daily as needed for mild constipation.     DULoxetine 60 MG capsule  Commonly known as:  CYMBALTA  TAKE 1 BY MOUTH AT BEDTIME     enalapril 20 MG tablet  Commonly known as:  VASOTEC  TAKE 1 BY MOUTH TWICE DAILY     fexofenadine 180 MG tablet  Commonly known as:  ALLEGRA  Take 180 mg by  mouth daily. AS NEEDED BASIS FOR SINUS     gabapentin 300 MG capsule  Commonly known as:  NEURONTIN  Take two (2) capsules by mouth three (3) times daily.     guaiFENesin 600 MG 12 hr tablet  Commonly known as:  MUCINEX  Take 600 mg by mouth daily as needed.     Magnesium 500 MG Caps  Take 500 mg by mouth every evening.     metFORMIN 1000 MG tablet  Commonly known as:  GLUCOPHAGE  TAKE 1 BY MOUTH TWICE DAILY WITH A MEAL     methocarbamol 500 MG tablet  Commonly known as:  ROBAXIN  Take 1 tablet (500 mg total) by mouth every 6 (six) hours as needed for muscle spasms.     mometasone 50 MCG/ACT nasal spray  Commonly known as:  NASONEX  Place 2 sprays into the nose daily.     omega-3 acid ethyl esters 1 g capsule  Commonly known as:  LOVAZA  TAKE 2 BY MOUTH TWICE DAILY     oxyCODONE 5 MG immediate release tablet  Commonly known as:  Oxy IR/ROXICODONE  Take 1-2 tablets (5-10 mg total) by mouth every 4 (four) hours as needed for severe pain.     pantoprazole 40 MG tablet  Commonly known as:  PROTONIX  Take 1 tablet (40 mg total) by mouth daily.     polyethylene glycol packet  Commonly known as:  MIRALAX / GLYCOLAX  Take 17 g by mouth daily.     rivaroxaban 10 MG Tabs tablet  Commonly known as:  XARELTO  Take 1 tablet (10 mg total) by mouth daily.     saxagliptin HCl 5 MG Tabs tablet  Commonly known as:  ONGLYZA  TAKE 1 BY MOUTH DAILY     Vitamin D (Ergocalciferol) 50000 units Caps capsule  Commonly known as:  DRISDOL  Take 1 capsule (50,000 Units total) by mouth as directed. Tuesday , Wednesday, Thursday, Sunday           Follow-up Information    Follow up with BEANE,JEFFREY C, MD In 2 weeks.   Specialty:  Orthopedic Surgery   Why:  For suture removal   Contact information:   [address] [place] [address] [phone]       Follow up with Johnn Hai, MD In 2 weeks.   Specialty:  Orthopedic Surgery   Contact information:    [address] [place] [address] [phone]       Follow up with  [facility].   Why:  home health physical therapy   Contact information:   [place] [address] [phone]       Follow up with [facility].   Why:  bariatric commode   Contact information:   [place] [address] [phone]       Signed: Lajean Manes 08/07/2015, 5:26 PM

## 2015-08-07 NOTE — Progress Notes (Signed)
OT Cancellation Note  Patient Details Name: Jason Wyatt MRN: [id number] DOB: 1955-08-25   Cancelled Treatment:    Reason Eval/Treat Not Completed: Other (comment).  Pt was working with PT. Checked on him and he has all DME and reacher, and feels comfortable using these.    No further acute OT needs at this time  [facility] 08/07/2015, 11:29 AM  Lesle Chris, OTR/L [phone] 08/07/2015

## 2015-08-23 ENCOUNTER — Other Ambulatory Visit: Payer: Medicare Other

## 2015-08-24 ENCOUNTER — Ambulatory Visit (INDEPENDENT_AMBULATORY_CARE_PROVIDER_SITE_OTHER): Payer: Medicare Other | Admitting: Physician Assistant

## 2015-08-24 VITALS — BP 104/70 | HR 100 | Temp 97.0°F | Resp 18 | Ht 75.0 in | Wt 296.6 lb

## 2015-08-24 DIAGNOSIS — E1142 Type 2 diabetes mellitus with diabetic polyneuropathy: Secondary | ICD-10-CM

## 2015-08-24 DIAGNOSIS — I70209 Unspecified atherosclerosis of native arteries of extremities, unspecified extremity: Secondary | ICD-10-CM

## 2015-08-24 DIAGNOSIS — Z79899 Other long term (current) drug therapy: Secondary | ICD-10-CM

## 2015-08-24 DIAGNOSIS — I10 Essential (primary) hypertension: Secondary | ICD-10-CM | POA: Diagnosis not present

## 2015-08-24 DIAGNOSIS — E785 Hyperlipidemia, unspecified: Secondary | ICD-10-CM | POA: Diagnosis not present

## 2015-08-24 DIAGNOSIS — E1129 Type 2 diabetes mellitus with other diabetic kidney complication: Secondary | ICD-10-CM | POA: Diagnosis not present

## 2015-08-24 DIAGNOSIS — E559 Vitamin D deficiency, unspecified: Secondary | ICD-10-CM

## 2015-08-24 LAB — CBC WITH DIFFERENTIAL/PLATELET
Basophils Absolute: 119 cells/uL (ref 0–200)
Basophils Relative: 1 %
Eosinophils Absolute: 119 cells/uL (ref 15–500)
Eosinophils Relative: 1 %
HCT: 44.4 % (ref 38.5–50.0)
Hemoglobin: 15.1 g/dL (ref 13.2–17.1)
Lymphocytes Relative: 18 %
Lymphs Abs: 2142 cells/uL (ref 850–3900)
MCH: 29.3 pg (ref 27.0–33.0)
MCHC: 34 g/dL (ref 32.0–36.0)
MCV: 86.2 fL (ref 80.0–100.0)
MPV: 9.2 fL (ref 7.5–12.5)
Monocytes Absolute: 714 cells/uL (ref 200–950)
Monocytes Relative: 6 %
Neutro Abs: 8806 cells/uL — ABNORMAL HIGH (ref 1500–7800)
Neutrophils Relative %: 74 %
Platelets: 647 10*3/uL — ABNORMAL HIGH (ref 140–400)
RBC: 5.15 MIL/uL (ref 4.20–5.80)
RDW: 17.1 % — ABNORMAL HIGH (ref 11.0–15.0)
WBC: 11.9 10*3/uL — ABNORMAL HIGH (ref 3.8–10.8)

## 2015-08-24 LAB — BASIC METABOLIC PANEL WITH GFR
BUN: 22 mg/dL (ref 7–25)
CO2: 20 mmol/L (ref 20–31)
Calcium: 10.5 mg/dL — ABNORMAL HIGH (ref 8.6–10.3)
Chloride: 102 mmol/L (ref 98–110)
Creat: 1.44 mg/dL — ABNORMAL HIGH (ref 0.70–1.25)
GFR, Est African American: 60 mL/min (ref >=60)
GFR, Est Non African American: 52 mL/min — ABNORMAL LOW (ref >=60)
Glucose, Bld: 177 mg/dL — ABNORMAL HIGH (ref 65–99)
Potassium: 5.3 mmol/L (ref 3.5–5.3)
Sodium: 137 mmol/L (ref 135–146)

## 2015-08-24 LAB — LIPID PANEL
Cholesterol: 167 mg/dL (ref 125–200)
HDL: 33 mg/dL — ABNORMAL LOW (ref >=40)
LDL Cholesterol: 71 mg/dL (ref <130)
Total CHOL/HDL Ratio: 5.1 Ratio — ABNORMAL HIGH (ref <=5.0)
Triglycerides: 316 mg/dL — ABNORMAL HIGH (ref <150)
VLDL: 63 mg/dL — ABNORMAL HIGH (ref <30)

## 2015-08-24 LAB — HEPATIC FUNCTION PANEL
ALT: 11 U/L (ref 9–46)
AST: 17 U/L (ref 10–35)
Albumin: 4.6 g/dL (ref 3.6–5.1)
Alkaline Phosphatase: 94 U/L (ref 40–115)
Bilirubin, Direct: 0.1 mg/dL (ref <=0.2)
Indirect Bilirubin: 0.5 mg/dL (ref 0.2–1.2)
Total Bilirubin: 0.6 mg/dL (ref 0.2–1.2)
Total Protein: 7.5 g/dL (ref 6.1–8.1)

## 2015-08-24 LAB — TSH: TSH: 2.75 mIU/L (ref 0.40–4.50)

## 2015-08-24 LAB — MAGNESIUM: Magnesium: 1.9 mg/dL (ref 1.5–2.5)

## 2015-08-24 NOTE — Progress Notes (Signed)
Patient ID: Jason Wyatt, male   DOB: Jul 04, 1955, 60 y.o.   MRN: [id number]  Assessment and Plan:  Hypertension:  -Continue medication -monitor blood pressure at home. -Continue DASH diet -Reminder to go to the ER if any CP, SOB, nausea, dizziness, severe HA, changes vision/speech, left arm numbness and tingling and jaw pain.  Cholesterol - Continue diet and exercise -Check cholesterol.   Diabetes with diabetic chronic kidney disease -Continue diet and exercise.  -Check A1C  Vitamin D Def -check level -continue medications  Dizziness/weight loss s/p knee replacement.  Likely due to poor oral intake/sweating, stop invokana for now and continue other medications Check labs No CP/SOB Continue PT  Morbid Obesity with co morbidities - long discussion about weight loss, diet, and exercise   Samples of januvia and invokana given.   Continue diet and meds as discussed. Further disposition pending results of labs. Discussed med's effects and SE's.    HPI 60 y.o. male  presents for 3 month follow up with hypertension, hyperlipidemia, diabetes and vitamin D deficiency.   His blood pressure has been controlled at home, today their BP is BP: 104/70.He does workout. He denies chest pain, shortness of breath, dizziness.   He is on cholesterol medication and denies myalgias. His cholesterol is at goal. The cholesterol was:  05/21/2015: Cholesterol 186; HDL 31; LDL Cholesterol NOT CALC; Triglycerides 475   He has been working on diet and exercise for diabetes with diabetic chronic kidney disease, he is on bASA, he is on ACE/ARB, and denies  foot ulcerations, hyperglycemia, hypoglycemia , increased appetite, nausea, paresthesia of the feet, polydipsia, polyuria, visual disturbances, vomiting and weight loss. Last A1C was: 07/28/2015: Hgb A1c MFr Bld 7.4   Patient is on Vitamin D supplement. 05/21/2015: Vit D, 25-Hydroxy 52  He is 2 weeks s/p left knee replacement, he is down 17 lbs since his  knee replacement due to decreased appetite, he is sweating profusely and not eating or drinking as much, has had some dizziness with standing. He is doing PT, he finished his xarelto Saturday, he is on 2 bASA, denies SOB CP.  BP Readings from Last 3 Encounters:  08/24/15 104/70  08/07/15 108/76  07/28/15 (!) 155/97     Current Medications:  Current Outpatient Prescriptions on File Prior to Visit  Medication Sig Dispense Refill  . acyclovir (ZOVIRAX) 400 MG tablet TAKE 1 BY MOUTH DAILY AS NEEDED FOR COLD SORES (Patient taking differently: Take 400 mg by mouth daily as needed (cold sores). ) 90 tablet 3  . allopurinol (ZYLOPRIM) 300 MG tablet TAKE 1 BY MOUTH AT BEDTIME (Patient taking differently: Take 300 mg by mouth every evening. ) 90 tablet 3  . buPROPion (WELLBUTRIN XL) 150 MG 24 hr tablet Take 1 tablet (150 mg total) by mouth every morning. 30 tablet 2  . canagliflozin (INVOKANA) 300 MG TABS tablet TAKE 1 BY MOUTH DAILY BEFORE BREAKFAST (Patient taking differently: Take 300 mg by mouth daily before breakfast. ) 90 tablet 3  . carbidopa-levodopa (SINEMET IR) 25-250 MG tablet TAKE 1 BY MOUTH TWICE DAILY FOR RESTLESS LEGS (Patient taking differently: Take 1 tablet by mouth 2 (two) times daily. ) 180 tablet 0  . colchicine 0.6 MG tablet Take 1 tablet (0.6 mg total) by mouth 2 (two) times daily as needed (gout flare ups). 180 tablet 2  . docusate sodium (COLACE) 100 MG capsule Take 1 capsule (100 mg total) by mouth 2 (two) times daily as needed for mild constipation. 30 capsule  1  . DULoxetine (CYMBALTA) 60 MG capsule TAKE 1 BY MOUTH AT BEDTIME (Patient taking differently: Take 60 mg by mouth every evening. TAKE 1 BY MOUTH AT BEDTIME) 90 capsule 2  . enalapril (VASOTEC) 20 MG tablet TAKE 1 BY MOUTH TWICE DAILY (Patient taking differently: Take 20 mg by mouth 2 (two) times daily. TAKE 1 BY MOUTH TWICE DAILY) 180 tablet 3  . fexofenadine (ALLEGRA) 180 MG tablet Take 180 mg by mouth daily. AS NEEDED  BASIS FOR SINUS    . gabapentin (NEURONTIN) 300 MG capsule Take two (2) capsules by mouth three (3) times daily. (Patient taking differently: Take 600 mg by mouth 3 (three) times daily. ) 540 capsule 3  . guaiFENesin (MUCINEX) 600 MG 12 hr tablet Take 600 mg by mouth daily as needed.    . Magnesium 500 MG CAPS Take 500 mg by mouth every evening.     . metFORMIN (GLUCOPHAGE) 1000 MG tablet TAKE 1 BY MOUTH TWICE DAILY WITH A MEAL (Patient taking differently: Take 1,000 mg by mouth 2 (two) times daily with a meal. TAKE 1 BY MOUTH TWICE DAILY WITH A MEAL) 180 tablet 2  . methocarbamol (ROBAXIN) 500 MG tablet Take 1 tablet (500 mg total) by mouth every 6 (six) hours as needed for muscle spasms. 40 tablet 1  . mometasone (NASONEX) 50 MCG/ACT nasal spray Place 2 sprays into the nose daily. 17 g 2  . omega-3 acid ethyl esters (LOVAZA) 1 g capsule TAKE 2 BY MOUTH TWICE DAILY (Patient taking differently: Take 2 g by mouth 2 (two) times daily. ) 360 capsule 3  . oxyCODONE (OXY IR/ROXICODONE) 5 MG immediate release tablet Take 1-2 tablets (5-10 mg total) by mouth every 4 (four) hours as needed for severe pain. 40 tablet 0  . pantoprazole (PROTONIX) 40 MG tablet Take 1 tablet (40 mg total) by mouth daily. 90 tablet 1  . saxagliptin HCl (ONGLYZA) 5 MG TABS tablet TAKE 1 BY MOUTH DAILY (Patient taking differently: Take 5 mg by mouth daily. ) 14 tablet 0  . Vitamin D, Ergocalciferol, (DRISDOL) 50000 units CAPS capsule Take 1 capsule (50,000 Units total) by mouth as directed. Tuesday , Wednesday, Thursday, Sunday (Patient taking differently: Take 50,000 Units by mouth 2 (two) times a week. Tuesday and Sunday) 90 capsule 1   No current facility-administered medications on file prior to visit.    Medical History:  Past Medical History:  Diagnosis Date  . Acute meniscal tear of left knee   . Allergic rhinitis   . Arthritis     back  . Borderline glaucoma   . Chronic back pain    stenosis  . CKD (chronic kidney  disease), stage II   . Complication of anesthesia cervical fusion surgery 06/2010   "woke up next day w/ventilator on" told due to OSA  . GERD (gastroesophageal reflux disease)    takes Prevacid daily  . Gout    takes Allopurinol daily and Colchicine if needed  . History of adenomatous polyp of colon    tubular adenoma's  . History of Barrett's esophagus   . History of hiatal hernia   . Hyperlipidemia   . Hypertension   . Incomplete right bundle branch block (RBBB)   . OA (osteoarthritis)    knee  . OSA on CPAP   . Peripheral neuropathy ([place])   . Prostate cancer [facility]) urologist-  dr Alinda Money   dx 2013  s/p  radical non-nerve sparing prostatectomy 2014--  Stage pT2cNx,  PSA 3.48,  Gleason 3+3,  vol 55cc/  current PSA  0.01 (Aug 2016)  . Thyroid nodule    left side  . Type II diabetes mellitus ([place])   . Urinary frequency    takes Flomax daily  . Urine incontinence   . Wears glasses    Allergies:  Allergies  Allergen Reactions  . Flaxseed [Linseed Oil] Rash  . Lyrica [Pregabalin] Itching and Rash  . Niaspan [Niacin Er] Rash     Review of Systems:  Review of Systems  Constitutional: Positive for malaise/fatigue and weight loss. Negative for chills, diaphoresis and fever.  HENT: Negative for congestion, ear pain and sore throat.   Eyes: Negative.   Respiratory: Negative for cough, shortness of breath and wheezing.   Cardiovascular: Negative for chest pain, palpitations and leg swelling.  Gastrointestinal: Positive for nausea. Negative for abdominal pain, blood in stool, constipation, diarrhea, heartburn, melena and vomiting.  Genitourinary: Negative.   Musculoskeletal: Positive for joint pain.  Skin: Negative.   Neurological: Negative for dizziness, sensory change, loss of consciousness, weakness and headaches.  Endo/Heme/Allergies: Negative for polydipsia.  Psychiatric/Behavioral: Negative for depression. The patient is not nervous/anxious and does not have insomnia.      Family history- Review and unchanged  Social history- Review and unchanged  Physical Exam: BP 104/70   Pulse 100   Temp 97 F (36.1 C)   Resp 18   Ht 6\' 3"  (1.905 m)   Wt 296 lb 9.6 oz (134.5 kg)   BMI 37.07 kg/m  Wt Readings from Last 3 Encounters:  08/24/15 296 lb 9.6 oz (134.5 kg)  08/05/15 (!) 314 lb (142.4 kg)  07/28/15 (!) 314 lb (142.4 kg)   General Appearance: Well nourished well developed, non-toxic appearing, in no apparent distress. Eyes: PERRLA, EOMs, conjunctiva no swelling or erythema ENT/Mouth: Ear canals clear with no erythema, swelling, or discharge.  TMs normal bilaterally, oropharynx clear, moist, with no exudate.   Neck: Supple, thyroid normal, no JVD, no cervical adenopathy.  Respiratory: Respiratory effort normal, breath sounds clear A&P, no wheeze, rhonchi or rales noted.  No retractions, no accessory muscle usage Cardio: RRR with no MRGs. No edema noted Abdomen: Soft, + BS, obese,  Non tender, no guarding, rebound, hernias, masses. Musculoskeletal: Full ROM, 5/5 strength, antalgic gait, left knee with well healing vertical scar without erythema, warmth, mild swelling. No distal swelling, negative homen's.  Skin: Warm, dry without rashes, lesions, ecchymosis.  Neuro: Awake and oriented X 3, Cranial nerves intact. No cerebellar symptoms.  Psych: normal affect, Insight and Judgment appropriate.    Vicie Mutters, PA-C 9:50 AM [facility]

## 2015-08-24 NOTE — Patient Instructions (Signed)
Stop the invokana x 2 days Then start back on 1/2 pill daily until your appetite is better.      Bad carbs also include fruit juice, alcohol, and sweet tea. These are empty calories that do not signal to your brain that you are full.   Please remember the good carbs are still carbs which convert into sugar. So please measure them out no more than 1/2-1 cup of rice, oatmeal, pasta, and beans  Veggies are however free foods! Pile them on.   Not all fruit is created equal. Please see the list below, the fruit at the bottom is higher in sugars than the fruit at the top. Please avoid all dried fruits.

## 2015-08-25 LAB — HEMOGLOBIN A1C
Hgb A1c MFr Bld: 6.7 % — ABNORMAL HIGH (ref <5.7)
Mean Plasma Glucose: 146 mg/dL

## 2015-09-06 ENCOUNTER — Ambulatory Visit (INDEPENDENT_AMBULATORY_CARE_PROVIDER_SITE_OTHER): Payer: Medicare Other | Admitting: Internal Medicine

## 2015-09-06 VITALS — BP 140/82 | HR 114 | Temp 98.0°F | Resp 18 | Ht 75.0 in | Wt 298.0 lb

## 2015-09-06 DIAGNOSIS — E860 Dehydration: Secondary | ICD-10-CM | POA: Diagnosis not present

## 2015-09-06 LAB — BASIC METABOLIC PANEL
BUN: 19 mg/dL (ref 7–25)
CO2: 23 mmol/L (ref 20–31)
Calcium: 9.9 mg/dL (ref 8.6–10.3)
Chloride: 101 mmol/L (ref 98–110)
Creat: 1.08 mg/dL (ref 0.70–1.25)
Glucose, Bld: 116 mg/dL — ABNORMAL HIGH (ref 65–99)
Potassium: 4.7 mmol/L (ref 3.5–5.3)
Sodium: 137 mmol/L (ref 135–146)

## 2015-09-06 MED ORDER — METHOCARBAMOL 500 MG PO TABS: 500.0000 mg | ORAL_TABLET | Freq: Two times a day (BID) | ORAL | 1 refills | Status: DC

## 2015-09-06 NOTE — Progress Notes (Signed)
Assessment and Plan:   1. Dehydration -improved physical appearance -BP normal -cont half dose of allopurinol, enalapril and stop invokana -if kidney function back to baseline consider increasing allopurinol back to full dose -monitor BP at home.   - Basic metabolic panel    HPI 60 y.o.male presents for 2 week follow up of blood pressure and kidney function which had been low due to poor intake secondary to recent Total Knee Replacement.. Patient reports that they have been doing well.  He was told to increase his fluid intake, cut enalapril in half, cut allopurinol in half, and stopped invokana.    male is taking their medication.  He is not checking BP at home.  They are not having difficulty with their medications.  His food intake has also improved.  He is still doing physical therapy. His weight is up 2 lbs from last week.   He reports that he has not been taking the pain medication.  He feels like the tylenol is helping.  He reports that his blood sugars.    Past Medical History:  Diagnosis Date  . Acute meniscal tear of left knee   . Allergic rhinitis   . Arthritis     back  . Borderline glaucoma   . Chronic back pain    stenosis  . CKD (chronic kidney disease), stage II   . Complication of anesthesia cervical fusion surgery 06/2010   "woke up next day w/ventilator on" told due to OSA  . GERD (gastroesophageal reflux disease)    takes Prevacid daily  . Gout    takes Allopurinol daily and Colchicine if needed  . History of adenomatous polyp of colon    tubular adenoma's  . History of Barrett's esophagus   . History of hiatal hernia   . Hyperlipidemia   . Hypertension   . Incomplete right bundle branch block (RBBB)   . OA (osteoarthritis)    knee  . OSA on CPAP   . Peripheral neuropathy ([place])   . Prostate cancer [facility]) urologist-  dr Alinda Money   dx 2013  s/p  radical non-nerve sparing prostatectomy 2014--  Stage pT2cNx,  PSA 3.48,  Gleason 3+3,  vol 55cc/  current PSA   0.01 (Aug 2016)  . Thyroid nodule    left side  . Type II diabetes mellitus ([place])   . Urinary frequency    takes Flomax daily  . Urine incontinence   . Wears glasses      Allergies  Allergen Reactions  . Flaxseed [Linseed Oil] Rash  . Lyrica [Pregabalin] Itching and Rash  . Niaspan [Niacin Er] Rash      Current Outpatient Prescriptions on File Prior to Visit  Medication Sig Dispense Refill  . acyclovir (ZOVIRAX) 400 MG tablet TAKE 1 BY MOUTH DAILY AS NEEDED FOR COLD SORES (Patient taking differently: Take 400 mg by mouth daily as needed (cold sores). ) 90 tablet 3  . allopurinol (ZYLOPRIM) 300 MG tablet TAKE 1 BY MOUTH AT BEDTIME (Patient taking differently: Take 300 mg by mouth every evening. ) 90 tablet 3  . canagliflozin (INVOKANA) 300 MG TABS tablet TAKE 1 BY MOUTH DAILY BEFORE BREAKFAST (Patient taking differently: Take 300 mg by mouth daily before breakfast. ) 90 tablet 3  . carbidopa-levodopa (SINEMET IR) 25-250 MG tablet TAKE 1 BY MOUTH TWICE DAILY FOR RESTLESS LEGS (Patient taking differently: Take 1 tablet by mouth 2 (two) times daily. ) 180 tablet 0  . colchicine 0.6 MG tablet Take 1 tablet (  0.6 mg total) by mouth 2 (two) times daily as needed (gout flare ups). 180 tablet 2  . docusate sodium (COLACE) 100 MG capsule Take 1 capsule (100 mg total) by mouth 2 (two) times daily as needed for mild constipation. 30 capsule 1  . DULoxetine (CYMBALTA) 60 MG capsule TAKE 1 BY MOUTH AT BEDTIME (Patient taking differently: Take 60 mg by mouth every evening. TAKE 1 BY MOUTH AT BEDTIME) 90 capsule 2  . enalapril (VASOTEC) 20 MG tablet TAKE 1 BY MOUTH TWICE DAILY (Patient taking differently: Take 20 mg by mouth 2 (two) times daily. TAKE 1 BY MOUTH TWICE DAILY) 180 tablet 3  . fexofenadine (ALLEGRA) 180 MG tablet Take 180 mg by mouth daily. AS NEEDED BASIS FOR SINUS    . gabapentin (NEURONTIN) 300 MG capsule Take two (2) capsules by mouth three (3) times daily. (Patient taking differently:  Take 600 mg by mouth 3 (three) times daily. ) 540 capsule 3  . guaiFENesin (MUCINEX) 600 MG 12 hr tablet Take 600 mg by mouth daily as needed.    . Magnesium 500 MG CAPS Take 500 mg by mouth every evening.     . metFORMIN (GLUCOPHAGE) 1000 MG tablet TAKE 1 BY MOUTH TWICE DAILY WITH A MEAL (Patient taking differently: Take 1,000 mg by mouth 2 (two) times daily with a meal. TAKE 1 BY MOUTH TWICE DAILY WITH A MEAL) 180 tablet 2  . methocarbamol (ROBAXIN) 500 MG tablet Take 1 tablet (500 mg total) by mouth every 6 (six) hours as needed for muscle spasms. 40 tablet 1  . mometasone (NASONEX) 50 MCG/ACT nasal spray Place 2 sprays into the nose daily. 17 g 2  . omega-3 acid ethyl esters (LOVAZA) 1 g capsule TAKE 2 BY MOUTH TWICE DAILY (Patient taking differently: Take 2 g by mouth 2 (two) times daily. ) 360 capsule 3  . oxyCODONE (OXY IR/ROXICODONE) 5 MG immediate release tablet Take 1-2 tablets (5-10 mg total) by mouth every 4 (four) hours as needed for severe pain. 40 tablet 0  . pantoprazole (PROTONIX) 40 MG tablet Take 1 tablet (40 mg total) by mouth daily. 90 tablet 1  . saxagliptin HCl (ONGLYZA) 5 MG TABS tablet TAKE 1 BY MOUTH DAILY (Patient taking differently: Take 5 mg by mouth daily. ) 14 tablet 0  . Vitamin D, Ergocalciferol, (DRISDOL) 50000 units CAPS capsule Take 1 capsule (50,000 Units total) by mouth as directed. Tuesday , Wednesday, Thursday, Sunday (Patient taking differently: Take 50,000 Units by mouth 2 (two) times a week. Tuesday and Sunday) 90 capsule 1   No current facility-administered medications on file prior to visit.     ROS: all negative except above.   Physical Exam: Filed Weights   09/06/15 1036  Weight: 298 lb (135.2 kg)   Wt Readings from Last 3 Encounters:  09/06/15 298 lb (135.2 kg)  08/24/15 296 lb 9.6 oz (134.5 kg)  08/05/15 (!) 314 lb (142.4 kg)   Vitals:   09/06/15 1036  BP: 140/82  Pulse: (!) 114  Resp: 18  Temp: 98 F (36.7 C)   Resp 18   Ht 6\' 3"   (1.905 m)   Wt 298 lb (135.2 kg)   BMI 37.25 kg/m  General Appearance: Obese, Well developed well nourished, non-toxic appearing in no apparent distress. Eyes: PERRLA, EOMs, conjunctiva w/ no swelling or erythema or discharge Sinuses: No Frontal/maxillary tenderness ENT/Mouth: Ear canals clear without swelling or erythema.  TM's normal bilaterally with no retractions, bulging, or loss of  landmarks.   Neck: Supple, thyroid normal, no notable JVD  Respiratory: Respiratory effort normal, Clear breath sounds anteriorly and posteriorly bilaterally without rales, rhonchi, wheezing or stridor. No retractions or accessory muscle usage. Cardio: RRR with no MRGs.   Abdomen: Soft, + BS.  Non tender, no guarding, rebound, hernias, masses.  Musculoskeletal: Well healing left midline surgical incision of the knee.  Limited flexion and extension of the knee.  Walks slowly with a walker, 3/5 strength, Skin: Warm, dry without rashes  Neuro: Awake and oriented X 3, Cranial nerves intact. Normal muscle tone, no cerebellar symptoms. Sensation intact.  Psych: normal affect, Insight and Judgment appropriate.     Starlyn Skeans, PA-C 10:45 AM [facility]

## 2015-09-06 NOTE — Patient Instructions (Addendum)
Continue the half dose of allopurinol.    Continue the half dose of the enalapril.     Lets stay off the invokana for right now.

## 2015-09-14 ENCOUNTER — Telehealth: Payer: Self-pay | Admitting: *Deleted

## 2015-09-14 NOTE — Telephone Encounter (Signed)
Patient called with concerns about elevated BP 177/104.  Patient states he was told to cut Enalapril in 1/2 and take BID.  Per Starlyn Skeans, PA-C orders, patient was advised with BP continuing to be elevated he needs to return to taking 1 pill BID and continue to monitor BP and call with any additional questions or concerns.

## 2015-09-30 ENCOUNTER — Encounter: Payer: Self-pay | Admitting: Podiatry

## 2015-09-30 ENCOUNTER — Ambulatory Visit (INDEPENDENT_AMBULATORY_CARE_PROVIDER_SITE_OTHER): Payer: Medicare Other | Admitting: Podiatry

## 2015-09-30 DIAGNOSIS — M79676 Pain in unspecified toe(s): Secondary | ICD-10-CM | POA: Diagnosis not present

## 2015-09-30 DIAGNOSIS — Q828 Other specified congenital malformations of skin: Secondary | ICD-10-CM

## 2015-09-30 DIAGNOSIS — E1142 Type 2 diabetes mellitus with diabetic polyneuropathy: Secondary | ICD-10-CM | POA: Diagnosis not present

## 2015-09-30 DIAGNOSIS — B351 Tinea unguium: Secondary | ICD-10-CM | POA: Diagnosis not present

## 2015-10-02 NOTE — Progress Notes (Signed)
He presents today with a chief complaint of painful elongated toenails.  Objective: Vital signs are stable alert and oriented 3. Pulses are palpable. Loss of sensation per Semmes-Weinstein monofilament. Nails are thick yellow dystrophic with mycotic and painful on palpation as well as debridement.  Assessment: Pain and limb secondary to onychomycosis and diabetic peripheral neuropathy.  Plan: Debridement of toenails 1 through 5 bilateral.

## 2015-11-03 ENCOUNTER — Other Ambulatory Visit: Payer: Self-pay

## 2015-11-03 ENCOUNTER — Other Ambulatory Visit: Payer: Self-pay | Admitting: *Deleted

## 2015-11-03 MED ORDER — CARBIDOPA-LEVODOPA 25-250 MG PO TABS: ORAL_TABLET | ORAL | 0 refills | Status: DC

## 2015-11-03 MED ORDER — SAXAGLIPTIN HCL 5 MG PO TABS: ORAL_TABLET | ORAL | 1 refills | Status: DC

## 2015-11-03 MED ORDER — PANTOPRAZOLE SODIUM 40 MG PO TBEC: 40.0000 mg | DELAYED_RELEASE_TABLET | Freq: Every day | ORAL | 1 refills | Status: DC

## 2015-11-04 ENCOUNTER — Other Ambulatory Visit: Payer: Self-pay

## 2015-11-04 MED ORDER — METFORMIN HCL 1000 MG PO TABS: ORAL_TABLET | ORAL | 2 refills | Status: DC

## 2015-11-26 LAB — HM DIABETES EYE EXAM

## 2015-11-29 ENCOUNTER — Ambulatory Visit: Payer: Self-pay

## 2015-11-29 ENCOUNTER — Ambulatory Visit (INDEPENDENT_AMBULATORY_CARE_PROVIDER_SITE_OTHER): Payer: Medicare Other

## 2015-11-29 DIAGNOSIS — Z23 Encounter for immunization: Secondary | ICD-10-CM

## 2015-11-29 NOTE — Progress Notes (Signed)
Pt presents for flu vaccine Given IM w/o concerns or questions.

## 2015-12-13 ENCOUNTER — Ambulatory Visit: Payer: Self-pay | Admitting: Internal Medicine

## 2015-12-17 ENCOUNTER — Encounter: Payer: Self-pay | Admitting: Internal Medicine

## 2015-12-17 ENCOUNTER — Ambulatory Visit (INDEPENDENT_AMBULATORY_CARE_PROVIDER_SITE_OTHER): Payer: Medicare Other | Admitting: Internal Medicine

## 2015-12-17 VITALS — BP 128/74 | HR 90 | Temp 98.0°F | Resp 18 | Ht 75.0 in | Wt 300.0 lb

## 2015-12-17 DIAGNOSIS — G2581 Restless legs syndrome: Secondary | ICD-10-CM | POA: Diagnosis not present

## 2015-12-17 DIAGNOSIS — M1000 Idiopathic gout, unspecified site: Secondary | ICD-10-CM

## 2015-12-17 DIAGNOSIS — E782 Mixed hyperlipidemia: Secondary | ICD-10-CM

## 2015-12-17 DIAGNOSIS — E1122 Type 2 diabetes mellitus with diabetic chronic kidney disease: Secondary | ICD-10-CM | POA: Diagnosis not present

## 2015-12-17 DIAGNOSIS — Z79899 Other long term (current) drug therapy: Secondary | ICD-10-CM

## 2015-12-17 DIAGNOSIS — N182 Chronic kidney disease, stage 2 (mild): Secondary | ICD-10-CM | POA: Diagnosis not present

## 2015-12-17 DIAGNOSIS — I10 Essential (primary) hypertension: Secondary | ICD-10-CM | POA: Diagnosis not present

## 2015-12-17 DIAGNOSIS — E559 Vitamin D deficiency, unspecified: Secondary | ICD-10-CM | POA: Diagnosis not present

## 2015-12-17 LAB — CBC WITH DIFFERENTIAL/PLATELET
Basophils Absolute: 0 cells/uL (ref 0–200)
Basophils Relative: 0 %
Eosinophils Absolute: 160 cells/uL (ref 15–500)
Eosinophils Relative: 2 %
HCT: 39.7 % (ref 38.5–50.0)
Hemoglobin: 13.3 g/dL (ref 13.2–17.1)
Lymphocytes Relative: 20 %
Lymphs Abs: 1600 cells/uL (ref 850–3900)
MCH: 29.6 pg (ref 27.0–33.0)
MCHC: 33.5 g/dL (ref 32.0–36.0)
MCV: 88.2 fL (ref 80.0–100.0)
MPV: 9.2 fL (ref 7.5–12.5)
Monocytes Absolute: 560 cells/uL (ref 200–950)
Monocytes Relative: 7 %
Neutro Abs: 5680 cells/uL (ref 1500–7800)
Neutrophils Relative %: 71 %
Platelets: 288 10*3/uL (ref 140–400)
RBC: 4.5 MIL/uL (ref 4.20–5.80)
RDW: 16 % — ABNORMAL HIGH (ref 11.0–15.0)
WBC: 8 10*3/uL (ref 3.8–10.8)

## 2015-12-17 LAB — TSH: TSH: 1.55 mIU/L (ref 0.40–4.50)

## 2015-12-17 MED ORDER — AMITRIPTYLINE HCL 25 MG PO TABS: 25.0000 mg | ORAL_TABLET | Freq: Every day | ORAL | 0 refills | Status: DC

## 2015-12-17 NOTE — Patient Instructions (Addendum)
Amitriptyline tablets What is this medicine? AMITRIPTYLINE (a mee TRIP ti leen) is used to treat depression. This medicine may be used for other purposes; ask your health care provider or pharmacist if you have questions. COMMON BRAND NAME(S): Elavil, Vanatrip What should I tell my health care provider before I take this medicine? They need to know if you have any of these conditions: -an alcohol problem -asthma, difficulty breathing -bipolar disorder or schizophrenia -difficulty passing urine, prostate trouble -glaucoma -heart disease or previous heart attack -liver disease -over active thyroid -seizures -thoughts or plans of suicide, a previous suicide attempt, or family history of suicide attempt -an unusual or allergic reaction to amitriptyline, other medicines, foods, dyes, or preservatives -pregnant or trying to get pregnant -breast-feeding How should I use this medicine? Take this medicine by mouth with a drink of water. Follow the directions on the prescription label. You can take the tablets with or without food. Take your medicine at regular intervals. Do not take it more often than directed. Do not stop taking this medicine suddenly except upon the advice of your doctor. Stopping this medicine too quickly may cause serious side effects or your condition may worsen. A special MedGuide will be given to you by the pharmacist with each prescription and refill. Be sure to read this information carefully each time. Talk to your pediatrician regarding the use of this medicine in children. Special care may be needed. Overdosage: If you think you have taken too much of this medicine contact a poison control center or emergency room at once. NOTE: This medicine is only for you. Do not share this medicine with others. What if I miss a dose? If you miss a dose, take it as soon as you can. If it is almost time for your next dose, take only that dose. Do not take double or extra doses. What  may interact with this medicine? Do not take this medicine with any of the following medications: -arsenic trioxide -certain medicines used to regulate abnormal heartbeat or to treat other heart conditions -cisapride -droperidol -halofantrine -linezolid -MAOIs like Carbex, Eldepryl, Marplan, Nardil, and Parnate -methylene blue -other medicines for mental depression -phenothiazines like perphenazine, thioridazine and chlorpromazine -pimozide -probucol -procarbazine -sparfloxacin -St. John's Wort -ziprasidone This medicine may also interact with the following medications: -atropine and related drugs like hyoscyamine, scopolamine, tolterodine and others -barbiturate medicines for inducing sleep or treating seizures, like phenobarbital -cimetidine -disulfiram -ethchlorvynol -thyroid hormones such as levothyroxine This list may not describe all possible interactions. Give your health care provider a list of all the medicines, herbs, non-prescription drugs, or dietary supplements you use. Also tell them if you smoke, drink alcohol, or use illegal drugs. Some items may interact with your medicine. What should I watch for while using this medicine? Tell your doctor if your symptoms do not get better or if they get worse. Visit your doctor or health care professional for regular checks on your progress. Because it may take several weeks to see the full effects of this medicine, it is important to continue your treatment as prescribed by your doctor. Patients and their families should watch out for new or worsening thoughts of suicide or depression. Also watch out for sudden changes in feelings such as feeling anxious, agitated, panicky, irritable, hostile, aggressive, impulsive, severely restless, overly excited and hyperactive, or not being able to sleep. If this happens, especially at the beginning of treatment or after a change in dose, call your health care professional. You may get   drowsy or  dizzy. Do not drive, use machinery, or do anything that needs mental alertness until you know how this medicine affects you. Do not stand or sit up quickly, especially if you are an older patient. This reduces the risk of dizzy or fainting spells. Alcohol may interfere with the effect of this medicine. Avoid alcoholic drinks. Do not treat yourself for coughs, colds, or allergies without asking your doctor or health care professional for advice. Some ingredients can increase possible side effects. Your mouth may get dry. Chewing sugarless gum or sucking hard candy, and drinking plenty of water will help. Contact your doctor if the problem does not go away or is severe. This medicine may cause dry eyes and blurred vision. If you wear contact lenses you may feel some discomfort. Lubricating drops may help. See your eye doctor if the problem does not go away or is severe. This medicine can cause constipation. Try to have a bowel movement at least every 2 to 3 days. If you do not have a bowel movement for 3 days, call your doctor or health care professional. This medicine can make you more sensitive to the sun. Keep out of the sun. If you cannot avoid being in the sun, wear protective clothing and use sunscreen. Do not use sun lamps or tanning beds/booths. What side effects may I notice from receiving this medicine? Side effects that you should report to your doctor or health care professional as soon as possible: -allergic reactions like skin rash, itching or hives, swelling of the face, lips, or tongue -anxious -breathing problems -changes in vision -confusion -elevated mood, decreased need for sleep, racing thoughts, impulsive behavior -eye pain -fast, irregular heartbeat -feeling faint or lightheaded, falls -feeling agitated, angry, or irritable -fever with increased sweating -hallucination, loss of contact with reality -seizures -stiff muscles -suicidal thoughts or other mood  changes -tingling, pain, or numbness in the feet or hands -trouble passing urine or change in the amount of urine -trouble sleeping -unusually weak or tired -vomiting -yellowing of the eyes or skin Side effects that usually do not require medical attention (report to your doctor or health care professional if they continue or are bothersome): -change in sex drive or performance -change in appetite or weight -constipation -dizziness -dry mouth -nausea -tired -tremors -upset stomach This list may not describe all possible side effects. Call your doctor for medical advice about side effects. You may report side effects to FDA at 1-800-FDA-1088. Where should I keep my medicine? Keep out of the reach of children. Store at room temperature between 20 and 25 degrees C (68 and 77 degrees F). Throw away any unused medicine after the expiration date. NOTE: This sheet is a summary. It may not cover all possible information. If you have questions about this medicine, talk to your doctor, pharmacist, or health care provider.  2017 Elsevier/Gold Standard (2015-06-04 12:14:15)

## 2015-12-17 NOTE — Progress Notes (Signed)
Patient ID: Jason Wyatt, male   DOB: 1955/05/15, 60 y.o.   MRN: [id number]  Assessment and Plan:  Hypertension:  -Continue medication -monitor blood pressure at home. -Continue DASH diet -Reminder to go to the ER if any CP, SOB, nausea, dizziness, severe HA, changes vision/speech, left arm numbness and tingling and jaw pain.  Cholesterol - Continue diet and exercise -Check cholesterol.   Diabetes with diabetic polyneuropathy, and CKD  -samples of januvia given today -well controlled per reports of CBGs -Continue diet and exercise.  -Check A1C  Vitamin D Def -check level -continue medications.   Gout -cont allopurinol -no recent flares  Morbid Obesity -cont diet and exercise as tolerated -continues to do PT for his knee  Restless leg -already on sinemet and gabapentin and cymbalta -try elavil at bedtime only   Continue diet and meds as discussed. Further disposition pending results of labs. Discussed med's effects and SE's.    HPI 60 y.o. male  presents for 3 month follow up with hypertension, hyperlipidemia, diabetes and vitamin D deficiency.   His blood pressure has been controlled at home, today their BP is BP: 128/74.He does not workout. He denies chest pain, shortness of breath, dizziness.   He is on cholesterol medication and denies myalgias. His cholesterol is at goal. The cholesterol was:  08/24/2015: Cholesterol 167; HDL 33; LDL Cholesterol 71; Triglycerides 316   He has been working on diet and exercise for diabetes without complications, he is on bASA, he is on ACE/ARB, and denies  foot ulcerations, hyperglycemia, hypoglycemia , increased appetite, nausea, paresthesia of the feet, polydipsia, polyuria, visual disturbances, vomiting and weight loss. Last A1C was: 08/24/2015: Hgb A1c MFr Bld 6.7 .  He does have some parasthesias in both his hands and his feet.    Patient is on Vitamin D supplement. 05/21/2015: Vit D, 25-Hydroxy 52  He reports that he is currently  working on downsizing at his house.  He reports that he is trying to get rid of a lot of stuff.  He would like to have a much smaller place. He does not want to have to take care of the house and then he wants to move to an apartment.    He reports that his feet and his legs are still bothering him a lot at nighttime.  He reports that he still has some restlessness in his legs.  He reports that he is still having issues with staying still.    No recent issues with gout.  He is taking allopurinol regularly.     Current Medications:  Current Outpatient Prescriptions on File Prior to Visit  Medication Sig Dispense Refill  . acyclovir (ZOVIRAX) 400 MG tablet TAKE 1 BY MOUTH DAILY AS NEEDED FOR COLD SORES (Patient taking differently: Take 400 mg by mouth daily as needed (cold sores). ) 90 tablet 3  . allopurinol (ZYLOPRIM) 300 MG tablet TAKE 1 BY MOUTH AT BEDTIME (Patient taking differently: Take 300 mg by mouth every evening. ) 90 tablet 3  . carbidopa-levodopa (SINEMET IR) 25-250 MG tablet TAKE 1 BY MOUTH TWICE DAILY FOR RESTLESS LEGS 180 tablet 0  . colchicine 0.6 MG tablet Take 1 tablet (0.6 mg total) by mouth 2 (two) times daily as needed (gout flare ups). 180 tablet 2  . DULoxetine (CYMBALTA) 60 MG capsule TAKE 1 BY MOUTH AT BEDTIME (Patient taking differently: Take 60 mg by mouth every evening. TAKE 1 BY MOUTH AT BEDTIME) 90 capsule 2  . enalapril (VASOTEC) 20  MG tablet TAKE 1 BY MOUTH TWICE DAILY (Patient taking differently: Take 20 mg by mouth 2 (two) times daily. TAKE 1 BY MOUTH TWICE DAILY) 180 tablet 3  . fexofenadine (ALLEGRA) 180 MG tablet Take 180 mg by mouth daily. AS NEEDED BASIS FOR SINUS    . gabapentin (NEURONTIN) 300 MG capsule Take two (2) capsules by mouth three (3) times daily. (Patient taking differently: Take 600 mg by mouth 3 (three) times daily. ) 540 capsule 3  . guaiFENesin (MUCINEX) 600 MG 12 hr tablet Take 600 mg by mouth daily as needed.    . Magnesium 500 MG CAPS Take  500 mg by mouth every evening.     . metFORMIN (GLUCOPHAGE) 1000 MG tablet TAKE 1 BY MOUTH TWICE DAILY WITH A MEAL 180 tablet 2  . mometasone (NASONEX) 50 MCG/ACT nasal spray Place 2 sprays into the nose daily. 17 g 2  . omega-3 acid ethyl esters (LOVAZA) 1 g capsule TAKE 2 BY MOUTH TWICE DAILY (Patient taking differently: Take 2 g by mouth 2 (two) times daily. ) 360 capsule 3  . pantoprazole (PROTONIX) 40 MG tablet Take 1 tablet (40 mg total) by mouth daily. 90 tablet 1  . saxagliptin HCl (ONGLYZA) 5 MG TABS tablet TAKE 1 BY MOUTH DAILY 90 tablet 1  . Vitamin D, Ergocalciferol, (DRISDOL) 50000 units CAPS capsule Take 1 capsule (50,000 Units total) by mouth as directed. Tuesday , Wednesday, Thursday, Sunday (Patient taking differently: Take 50,000 Units by mouth 2 (two) times a week. Tuesday and Sunday) 90 capsule 1   No current facility-administered medications on file prior to visit.    Medical History:  Past Medical History:  Diagnosis Date  . Acute meniscal tear of left knee   . Allergic rhinitis   . Arthritis     back  . Borderline glaucoma   . Chronic back pain    stenosis  . CKD (chronic kidney disease), stage II   . Complication of anesthesia cervical fusion surgery 06/2010   "woke up next day w/ventilator on" told due to OSA  . GERD (gastroesophageal reflux disease)    takes Prevacid daily  . Gout    takes Allopurinol daily and Colchicine if needed  . History of adenomatous polyp of colon    tubular adenoma's  . History of Barrett's esophagus   . History of hiatal hernia   . Hyperlipidemia   . Hypertension   . Incomplete right bundle branch block (RBBB)   . OA (osteoarthritis)    knee  . OSA on CPAP   . Peripheral neuropathy ([place])   . Prostate cancer [facility]) urologist-  dr Alinda Money   dx 2013  s/p  radical non-nerve sparing prostatectomy 2014--  Stage pT2cNx,  PSA 3.48,  Gleason 3+3,  vol 55cc/  current PSA  0.01 (Aug 2016)  . Thyroid nodule    left side  . Type II  diabetes mellitus ([place])   . Urinary frequency    takes Flomax daily  . Urine incontinence   . Wears glasses    Allergies:  Allergies  Allergen Reactions  . Flaxseed [Linseed Oil] Rash  . Lyrica [Pregabalin] Itching and Rash  . Niaspan [Niacin Er] Rash     Review of Systems:  Review of Systems  Constitutional: Negative for chills, fever and malaise/fatigue.  HENT: Negative for congestion, ear pain and sore throat.   Eyes: Negative.   Respiratory: Negative for cough, shortness of breath and wheezing.   Cardiovascular: Negative for chest  pain, palpitations and leg swelling.  Gastrointestinal: Negative for abdominal pain, blood in stool, constipation, diarrhea, heartburn and melena.  Genitourinary: Negative.   Skin: Negative.   Neurological: Negative for dizziness, sensory change, loss of consciousness and headaches.  Psychiatric/Behavioral: Negative for depression. The patient is not nervous/anxious and does not have insomnia.     Family history- Review and unchanged  Social history- Review and unchanged  Physical Exam: BP 128/74   Pulse 90   Temp 98 F (36.7 C) (Temporal)   Resp 18   Ht 6\' 3"  (1.905 m)   Wt 300 lb (136.1 kg)   BMI 37.50 kg/m  Wt Readings from Last 3 Encounters:  12/17/15 300 lb (136.1 kg)  09/06/15 298 lb (135.2 kg)  08/24/15 296 lb 9.6 oz (134.5 kg)   General Appearance: Well nourished well developed, non-toxic appearing, in no apparent distress. Eyes: PERRLA, EOMs, conjunctiva no swelling or erythema ENT/Mouth: Ear canals clear with no erythema, swelling, or discharge.  TMs normal bilaterally, oropharynx clear, moist, with no exudate.   Neck: Supple, thyroid normal, no JVD, no cervical adenopathy.  Respiratory: Respiratory effort normal, breath sounds clear A&P, no wheeze, rhonchi or rales noted.  No retractions, no accessory muscle usage Cardio: RRR with no MRGs. No noted edema.  Abdomen: Soft, + BS.  Non tender, no guarding, rebound, hernias,  masses. Musculoskeletal: Full ROM, 5/5 strength, Antalgic gait.  Missing 15-20 degrees of active knee flexion Skin: Warm, dry without rashes, lesions, ecchymosis.  Neuro: Awake and oriented X 3, Cranial nerves intact. No cerebellar symptoms.  Psych: normal affect, Insight and Judgment appropriate.    Starlyn Skeans, PA-C 11:44 AM [facility]

## 2015-12-17 NOTE — Progress Notes (Deleted)
[facility] Unk Pinto, M.D.        Uvaldo Bristle. Silverio Lay, P.A.-C       Starlyn Skeans, P.A.-C  [facility]                [address] [place] [id number] Telephone [phone] Telefax [phone] ______________________________________________________________________     This very nice 60 y.o. MWM presents for 3 month follow up with Hypertension, Hyperlipidemia, Pre-Diabetes and Vitamin D Deficiency.      Patient is treated for HTN & BP has been controlled at home. Today's  . Patient has had no complaints of any cardiac type chest pain, palpitations, dyspnea/orthopnea/PND, dizziness, claudication, or dependent edema.     Hyperlipidemia is controlled with diet & meds. Patient denies myalgias or other med SE's. Last Lipids were at goal albeit elevated Trig's: Lab Results  Component Value Date   CHOL 167 08/24/2015   HDL 33 (L) 08/24/2015   LDLCALC 71 08/24/2015   TRIG 316 (H) 08/24/2015   CHOLHDL 5.1 (H) 08/24/2015      Also, the patient has history of T2_NIDDM and has had no symptoms of reactive hypoglycemia, diabetic polys, paresthesias or visual blurring.  Last A1c was not at goal: Lab Results  Component Value Date   HGBA1C 6.7 (H) 08/24/2015      Further, the patient also has history of Vitamin D Deficiency and supplements vitamin D without any suspected side-effects. Last vitamin D was   Lab Results  Component Value Date   VD25OH 81 05/21/2015   Current Outpatient Prescriptions on File Prior to Visit  Medication Sig  . acyclovir (ZOVIRAX) 400 MG tablet TAKE 1 BY MOUTH DAILY AS NEEDED FOR COLD SORES (Patient taking differently: Take 400 mg by mouth daily as needed (cold sores). )  . allopurinol (ZYLOPRIM) 300 MG tablet TAKE 1 BY MOUTH AT BEDTIME (Patient taking differently: Take 300 mg by mouth every evening. )  . carbidopa-levodopa (SINEMET IR) 25-250 MG tablet TAKE 1 BY MOUTH  TWICE DAILY FOR RESTLESS LEGS  . colchicine 0.6 MG tablet Take 1 tablet (0.6 mg total) by mouth 2 (two) times daily as needed (gout flare ups).  . docusate sodium (COLACE) 100 MG capsule Take 1 capsule (100 mg total) by mouth 2 (two) times daily as needed for mild constipation.  . DULoxetine (CYMBALTA) 60 MG capsule TAKE 1 BY MOUTH AT BEDTIME (Patient taking differently: Take 60 mg by mouth every evening. TAKE 1 BY MOUTH AT BEDTIME)  . enalapril (VASOTEC) 20 MG tablet TAKE 1 BY MOUTH TWICE DAILY (Patient taking differently: Take 20 mg by mouth 2 (two) times daily. TAKE 1 BY MOUTH TWICE DAILY)  . fexofenadine (ALLEGRA) 180 MG tablet Take 180 mg by mouth daily. AS NEEDED BASIS FOR SINUS  . gabapentin (NEURONTIN) 300 MG capsule Take two (2) capsules by mouth three (3) times daily. (Patient taking differently: Take 600 mg by mouth 3 (three) times daily. )  . guaiFENesin (MUCINEX) 600 MG 12 hr tablet Take 600 mg by mouth daily as needed.  . Magnesium 500 MG CAPS Take 500 mg by mouth every evening.   . metFORMIN (GLUCOPHAGE) 1000 MG tablet TAKE 1 BY MOUTH TWICE DAILY WITH A MEAL  . methocarbamol (ROBAXIN) 500 MG tablet Take 1 tablet (500 mg total) by mouth 2 (two) times daily.  . mometasone (NASONEX) 50 MCG/ACT nasal spray  Place 2 sprays into the nose daily.  [name] omega-3 acid ethyl esters (LOVAZA) 1 g capsule TAKE 2 BY MOUTH TWICE DAILY (Patient taking differently: Take 2 g by mouth 2 (two) times daily. )  . oxyCODONE (OXY IR/ROXICODONE) 5 MG immediate release tablet Take 1-2 tablets (5-10 mg total) by mouth every 4 (four) hours as needed for severe pain.  . pantoprazole (PROTONIX) 40 MG tablet Take 1 tablet (40 mg total) by mouth daily.  . saxagliptin HCl (ONGLYZA) 5 MG TABS tablet TAKE 1 BY MOUTH DAILY  . Vitamin D, Ergocalciferol, (DRISDOL) 50000 units CAPS capsule Take 1 capsule (50,000 Units total) by mouth as directed. Tuesday , Wednesday, Thursday, Sunday (Patient taking differently: Take 50,000 Units  by mouth 2 (two) times a week. Tuesday and Sunday)   No current facility-administered medications on file prior to visit.    Allergies  Allergen Reactions  . Flaxseed [Linseed Oil] Rash  . Lyrica [Pregabalin] Itching and Rash  . Niaspan [Niacin Er] Rash   PMHx:   Past Medical History:  Diagnosis Date  . Acute meniscal tear of left knee   . Allergic rhinitis   . Arthritis     back  . Borderline glaucoma   . Chronic back pain    stenosis  . CKD (chronic kidney disease), stage II   . Complication of anesthesia cervical fusion surgery 06/2010   "woke up next day w/ventilator on" told due to OSA  . GERD (gastroesophageal reflux disease)    takes Prevacid daily  . Gout    takes Allopurinol daily and Colchicine if needed  . History of adenomatous polyp of colon    tubular adenoma's  . History of Barrett's esophagus   . History of hiatal hernia   . Hyperlipidemia   . Hypertension   . Incomplete right bundle branch block (RBBB)   . OA (osteoarthritis)    knee  . OSA on CPAP   . Peripheral neuropathy ([place])   . Prostate cancer [facility]) urologist-  dr Alinda Money   dx 2013  s/p  radical non-nerve sparing prostatectomy 2014--  Stage pT2cNx,  PSA 3.48,  Gleason 3+3,  vol 55cc/  current PSA  0.01 (Aug 2016)  . Thyroid nodule    left side  . Type II diabetes mellitus ([place])   . Urinary frequency    takes Flomax daily  . Urine incontinence   . Wears glasses    Immunization History  Administered Date(s) Administered  . Influenza Split 10/17/2013, 10/29/2014  . Influenza, Seasonal, Injecte, Preservative Fre 11/29/2015  . Influenza-Unspecified 11/04/2012  . PPD Test 01/16/2006, 04/07/2013  . Pneumococcal Polysaccharide-23 04/07/2013  . Pneumococcal-Unspecified 09/17/2010  . Td 01/17/2011  . Zoster 01/16/2005   Past Surgical History:  Procedure Laterality Date  . ANAL FISSURE REPAIR  06-05-2002  . BACK SURGERY    . CARDIAC CATHETERIZATION    . CARDIOVASCULAR STRESS TEST  01-01-2008    normal nuclear study/  no ischemia/  normal LV function and wall motion , 57%  . ESOPHAGOGASTRODUODENOSCOPY  09/26/2011   Procedure: ESOPHAGOGASTRODUODENOSCOPY (EGD);  Surgeon: Cleotis Nipper, MD;  Location: [facility];  Service: Endoscopy;  Laterality: N/A;  . KNEE ARTHROSCOPY WITH MEDIAL MENISECTOMY Left 12/31/2014   Procedure: LEFT KNEE ARTHROSCOPY, PARTIAL MEDIAL AND LATERAL  MENISECTOMIES WITH [place];  Surgeon: Susa Day, MD;  Location: [facility];  Service: Orthopedics;  Laterality: Left;  . LEFT AND RIGHT HEART CATHETERIZATION WITH CORONARY ANGIOGRAM N/A 08/25/2011   Procedure: LEFT AND  RIGHT HEART CATHETERIZATION WITH CORONARY ANGIOGRAM;  Surgeon: Peter M Martinique, MD;  Location: [facility];  Service: Cardiovascular;  Laterality: N/A;   Normal coronary arteries and LVF, ef 55-60%  . POSTERIOR FUSION CERVICAL SPINE  07-05-2010   C1 --  C4  . POSTERIOR LUMBAR FUSION  05-18-2014   L4 -- S1  . ROBOT ASSISTED LAPAROSCOPIC RADICAL PROSTATECTOMY  02/07/2012   Procedure: ROBOTIC ASSISTED LAPAROSCOPIC RADICAL PROSTATECTOMY LEVEL 1;  Surgeon: Molli Hazard, MD;  Location: [facility];  Service: Urology;  Laterality: N/A;     . TOTAL KNEE ARTHROPLASTY Left 08/05/2015   Procedure: LEFT TOTAL KNEE ARTHROPLASTY;  Surgeon: Susa Day, MD;  Location: [facility];  Service: Orthopedics;  Laterality: Left;  . TRANSTHORACIC ECHOCARDIOGRAM  08-03-2011   mild LVH, ef 55-60%/  trivial TR and PR  . UVULOPALATOPHARYNGOPLASTY (UPPP)/TONSILLECTOMY/SEPTOPLASTY  1990's   and Adenoidectomy  . wisdom teeth extracted   1982   FHx:    Reviewed / unchanged  SHx:    Reviewed / unchanged  Systems Review:  Constitutional: Denies fever, chills, wt changes, headaches, insomnia, fatigue, night sweats, change in appetite. Eyes: Denies redness, blurred vision, diplopia, discharge, itchy, watery eyes.  ENT: Denies discharge, congestion, post nasal drip, epistaxis, sore throat, earache, hearing  loss, dental pain, tinnitus, vertigo, sinus pain, snoring.  CV: Denies chest pain, palpitations, irregular heartbeat, syncope, dyspnea, diaphoresis, orthopnea, PND, claudication or edema. Respiratory: denies cough, dyspnea, DOE, pleurisy, hoarseness, laryngitis, wheezing.  Gastrointestinal: Denies dysphagia, odynophagia, heartburn, reflux, water brash, abdominal pain or cramps, nausea, vomiting, bloating, diarrhea, constipation, hematemesis, melena, hematochezia  or hemorrhoids. Genitourinary: Denies dysuria, frequency, urgency, nocturia, hesitancy, discharge, hematuria or flank pain. Musculoskeletal: Denies arthralgias, myalgias, stiffness, jt. swelling, pain, limping or strain/sprain.  Skin: Denies pruritus, rash, hives, warts, acne, eczema or change in skin lesion(s). Neuro: No weakness, tremor, incoordination, spasms, paresthesia or pain. Psychiatric: Denies confusion, memory loss or sensory loss. Endo: Denies change in weight, skin or hair change.  Heme/Lymph: No excessive bleeding, bruising or enlarged lymph nodes.  Physical Exam There were no vitals taken for this visit.  Appears well nourished and in no distress.  Eyes: PERRLA, EOMs, conjunctiva no swelling or erythema. Sinuses: No frontal/maxillary tenderness ENT/Mouth: EAC's clear, TM's nl w/o erythema, bulging. Nares clear w/o erythema, swelling, exudates. Oropharynx clear without erythema or exudates. Oral hygiene is good. Tongue normal, non obstructing. Hearing intact.  Neck: Supple. Thyroid nl. Car 2+/2+ without bruits, nodes or JVD. Chest: Respirations nl with BS clear & equal w/o rales, rhonchi, wheezing or stridor.  Cor: Heart sounds normal w/ regular rate and rhythm without sig. murmurs, gallops, clicks, or rubs. Peripheral pulses normal and equal  without edema.  Abdomen: Soft & bowel sounds normal. Non-tender w/o guarding, rebound, hernias, masses, or organomegaly.  Lymphatics: Unremarkable.  Musculoskeletal: Full ROM all  peripheral extremities, joint stability, 5/5 strength, and normal gait.  Skin: Warm, dry without exposed rashes, lesions or ecchymosis apparent.  Neuro: Cranial nerves intact, reflexes equal bilaterally. Sensory-motor testing grossly intact. Tendon reflexes grossly intact.  Pysch: Alert & oriented x 3.  Insight and judgement nl & appropriate. No ideations.  Assessment and Plan:  - Continue medication, monitor blood pressure at home. Continue DASH diet. Reminder to go to the ER if any CP, SOB, nausea, dizziness, severe HA, changes vision/speech, left arm numbness and tingling and jaw pain. - Continue diet/meds, exercise,& lifestyle modifications. Continue monitor periodic cholesterol/liver & renal functions   - Continue diet, exercise, lifestyle modifications. Monitor  appropriate labs. - Continue supplementation.      Recommended regular exercise, BP monitoring, weight control, and discussed med and SE's. Recommended labs to assess and monitor clinical status. Further disposition pending results of labs. Over 30 minutes of exam, counseling, chart review was performed

## 2015-12-18 ENCOUNTER — Encounter: Payer: Self-pay | Admitting: *Deleted

## 2015-12-18 LAB — HEPATIC FUNCTION PANEL
ALT: 13 U/L (ref 9–46)
AST: 22 U/L (ref 10–35)
Albumin: 4.2 g/dL (ref 3.6–5.1)
Alkaline Phosphatase: 62 U/L (ref 40–115)
Bilirubin, Direct: 0.1 mg/dL (ref <=0.2)
Indirect Bilirubin: 0.5 mg/dL (ref 0.2–1.2)
Total Bilirubin: 0.6 mg/dL (ref 0.2–1.2)
Total Protein: 6.7 g/dL (ref 6.1–8.1)

## 2015-12-18 LAB — HEMOGLOBIN A1C
Hgb A1c MFr Bld: 6.4 % — ABNORMAL HIGH (ref <5.7)
Mean Plasma Glucose: 137 mg/dL

## 2015-12-18 LAB — BASIC METABOLIC PANEL WITH GFR
BUN: 18 mg/dL (ref 7–25)
CO2: 25 mmol/L (ref 20–31)
Calcium: 9.4 mg/dL (ref 8.6–10.3)
Chloride: 106 mmol/L (ref 98–110)
Creat: 0.95 mg/dL (ref 0.70–1.25)
GFR, Est African American: >89 mL/min (ref >=60)
GFR, Est Non African American: 85 mL/min (ref >=60)
Glucose, Bld: 127 mg/dL — ABNORMAL HIGH (ref 65–99)
Potassium: 4.3 mmol/L (ref 3.5–5.3)
Sodium: 141 mmol/L (ref 135–146)

## 2015-12-18 LAB — LIPID PANEL
Cholesterol: 163 mg/dL (ref <200)
HDL: 27 mg/dL — ABNORMAL LOW (ref >40)
LDL Cholesterol: 91 mg/dL (ref <100)
Total CHOL/HDL Ratio: 6 Ratio — ABNORMAL HIGH (ref <5.0)
Triglycerides: 226 mg/dL — ABNORMAL HIGH (ref <150)
VLDL: 45 mg/dL — ABNORMAL HIGH (ref <30)

## 2015-12-30 ENCOUNTER — Encounter: Payer: Self-pay | Admitting: Podiatry

## 2015-12-30 ENCOUNTER — Ambulatory Visit (INDEPENDENT_AMBULATORY_CARE_PROVIDER_SITE_OTHER): Payer: Medicare Other | Admitting: Podiatry

## 2015-12-30 DIAGNOSIS — B351 Tinea unguium: Secondary | ICD-10-CM | POA: Diagnosis not present

## 2015-12-30 DIAGNOSIS — M79676 Pain in unspecified toe(s): Secondary | ICD-10-CM

## 2015-12-30 DIAGNOSIS — Q828 Other specified congenital malformations of skin: Secondary | ICD-10-CM | POA: Diagnosis not present

## 2016-01-02 NOTE — Progress Notes (Signed)
Presents today with a chief complaint of painful elongated toenails and diabetes.  Objective: Vital signs are stable alert and oriented 3 pulses are palpable. It is along thick yellow dystrophic mycotic pain upon palpation.  Assessment: Pain in limb Segner onychomycosis.  Plan: Debridement toenails 1 through 5 bilateral covered service second opinion diabetes.

## 2016-01-21 DIAGNOSIS — Z96652 Presence of left artificial knee joint: Secondary | ICD-10-CM | POA: Diagnosis not present

## 2016-01-21 DIAGNOSIS — Z471 Aftercare following joint replacement surgery: Secondary | ICD-10-CM | POA: Diagnosis not present

## 2016-01-27 ENCOUNTER — Other Ambulatory Visit: Payer: Self-pay | Admitting: *Deleted

## 2016-01-27 MED ORDER — ACYCLOVIR 400 MG PO TABS: ORAL_TABLET | ORAL | 1 refills | Status: DC

## 2016-01-27 MED ORDER — DULOXETINE HCL 60 MG PO CPEP: ORAL_CAPSULE | ORAL | 1 refills | Status: DC

## 2016-01-27 MED ORDER — ENALAPRIL MALEATE 20 MG PO TABS: ORAL_TABLET | ORAL | 1 refills | Status: DC

## 2016-01-27 MED ORDER — GABAPENTIN 300 MG PO CAPS: ORAL_CAPSULE | ORAL | 1 refills | Status: DC

## 2016-01-27 MED ORDER — SITAGLIPTIN PHOSPHATE 100 MG PO TABS: 100.0000 mg | ORAL_TABLET | Freq: Every day | ORAL | 1 refills | Status: DC

## 2016-01-27 MED ORDER — METFORMIN HCL 1000 MG PO TABS: ORAL_TABLET | ORAL | 1 refills | Status: DC

## 2016-01-27 MED ORDER — PANTOPRAZOLE SODIUM 40 MG PO TBEC: 40.0000 mg | DELAYED_RELEASE_TABLET | Freq: Every day | ORAL | 1 refills | Status: DC

## 2016-01-27 MED ORDER — CARBIDOPA-LEVODOPA 25-250 MG PO TABS: ORAL_TABLET | ORAL | 1 refills | Status: DC

## 2016-01-27 MED ORDER — ALLOPURINOL 300 MG PO TABS: ORAL_TABLET | ORAL | 1 refills | Status: DC

## 2016-01-31 ENCOUNTER — Other Ambulatory Visit: Payer: Self-pay

## 2016-01-31 MED ORDER — ONETOUCH ULTRA SYSTEM W/DEVICE KIT: 1.0000 | PACK | Freq: Once | 0 refills | Status: AC

## 2016-01-31 MED ORDER — ONETOUCH LANCETS MISC: 2 refills | Status: DC

## 2016-02-25 ENCOUNTER — Other Ambulatory Visit: Payer: Self-pay | Admitting: *Deleted

## 2016-02-25 MED ORDER — GLUCOSE BLOOD VI STRP: ORAL_STRIP | 12 refills | Status: DC

## 2016-03-20 ENCOUNTER — Other Ambulatory Visit: Payer: Self-pay | Admitting: Physician Assistant

## 2016-03-20 ENCOUNTER — Encounter: Payer: Self-pay | Admitting: Internal Medicine

## 2016-03-20 ENCOUNTER — Ambulatory Visit (INDEPENDENT_AMBULATORY_CARE_PROVIDER_SITE_OTHER): Payer: Medicare Other | Admitting: Internal Medicine

## 2016-03-20 VITALS — BP 118/66 | HR 104 | Temp 98.2°F | Resp 18 | Ht 75.0 in | Wt 312.0 lb

## 2016-03-20 DIAGNOSIS — R609 Edema, unspecified: Secondary | ICD-10-CM

## 2016-03-20 DIAGNOSIS — R21 Rash and other nonspecific skin eruption: Secondary | ICD-10-CM

## 2016-03-20 DIAGNOSIS — E782 Mixed hyperlipidemia: Secondary | ICD-10-CM | POA: Diagnosis not present

## 2016-03-20 DIAGNOSIS — E1142 Type 2 diabetes mellitus with diabetic polyneuropathy: Secondary | ICD-10-CM

## 2016-03-20 DIAGNOSIS — E1129 Type 2 diabetes mellitus with other diabetic kidney complication: Secondary | ICD-10-CM

## 2016-03-20 DIAGNOSIS — Z79899 Other long term (current) drug therapy: Secondary | ICD-10-CM | POA: Diagnosis not present

## 2016-03-20 DIAGNOSIS — I10 Essential (primary) hypertension: Secondary | ICD-10-CM

## 2016-03-20 DIAGNOSIS — N182 Chronic kidney disease, stage 2 (mild): Secondary | ICD-10-CM

## 2016-03-20 DIAGNOSIS — B351 Tinea unguium: Secondary | ICD-10-CM

## 2016-03-20 LAB — CBC WITH DIFFERENTIAL/PLATELET
Basophils Absolute: 82 cells/uL (ref 0–200)
Basophils Relative: 1 %
Eosinophils Absolute: 328 cells/uL (ref 15–500)
Eosinophils Relative: 4 %
HCT: 41.5 % (ref 38.5–50.0)
Hemoglobin: 13.8 g/dL (ref 13.2–17.1)
Lymphocytes Relative: 25 %
Lymphs Abs: 2050 cells/uL (ref 850–3900)
MCH: 28.8 pg (ref 27.0–33.0)
MCHC: 33.3 g/dL (ref 32.0–36.0)
MCV: 86.5 fL (ref 80.0–100.0)
MPV: 9.5 fL (ref 7.5–12.5)
Monocytes Absolute: 656 cells/uL (ref 200–950)
Monocytes Relative: 8 %
Neutro Abs: 5084 cells/uL (ref 1500–7800)
Neutrophils Relative %: 62 %
Platelets: 291 10*3/uL (ref 140–400)
RBC: 4.8 MIL/uL (ref 4.20–5.80)
RDW: 15.8 % — ABNORMAL HIGH (ref 11.0–15.0)
WBC: 8.2 10*3/uL (ref 3.8–10.8)

## 2016-03-20 MED ORDER — FUROSEMIDE 40 MG PO TABS: 40.0000 mg | ORAL_TABLET | Freq: Every day | ORAL | 0 refills | Status: DC

## 2016-03-20 MED ORDER — RANITIDINE HCL 300 MG PO TABS: 300.0000 mg | ORAL_TABLET | Freq: Every day | ORAL | 1 refills | Status: DC

## 2016-03-20 MED ORDER — METHOCARBAMOL 500 MG PO TABS: 500.0000 mg | ORAL_TABLET | Freq: Three times a day (TID) | ORAL | 0 refills | Status: DC

## 2016-03-20 MED ORDER — CLOBETASOL PROPIONATE 0.05 % EX CREA: 1.0000 "application " | TOPICAL_CREAM | Freq: Two times a day (BID) | CUTANEOUS | 0 refills | Status: DC

## 2016-03-20 NOTE — Progress Notes (Signed)
Assessment and Plan:  Hypertension:  -Continue medication -monitor blood pressure at home. -Continue DASH diet -Reminder to go to the ER if any CP, SOB, nausea, dizziness, severe HA, changes vision/speech, left arm numbness and tingling and jaw pain.  Cholesterol -cont statin -goal LDL <70 - Continue diet and exercise -Check cholesterol.   Diabetes with diabetic chronic kidney disease  -good control although is having trouble affording [place] -can condier using glipizide -Continue diet and exercise.  -Check A1C  Vitamin D Def -check level -continue medications.   Rash -possible plaque psoriasis vs. Eczema -try clobetasol cream -use vaseline on top of this -avoid detergents with scents if possible -gentle soap  Onychomycosis -recommended calling to see if he can get in sooner to get his nails trimmed at podiatry -patient aware we do not have the capability to trim them here.  Peripheral edema -lasix daily until swelling resolves -avoid salt intake -elevate feet above heart   Continue diet and meds as discussed. Further disposition pending results of labs. Discussed med's effects and SE's.    HPI 61 y.o. male  presents for 3 month follow up with hypertension, hyperlipidemia, diabetes and vitamin D deficiency.   His blood pressure has been controlled at home, today their BP is BP: 118/66.He does not workout. He denies chest pain, shortness of breath, dizziness.   He is on cholesterol medication and denies myalgias. His cholesterol is at goal. The cholesterol was:  12/17/2015: Cholesterol 163; HDL 27; LDL Cholesterol 91; Triglycerides 226   He has been working on diet and exercise for diabetes with diabetic chronic kidney disease, he is on bASA, he is on ACE/ARB, and denies  foot ulcerations, hyperglycemia, hypoglycemia , increased appetite, nausea, paresthesia of the feet, polydipsia, polyuria, visual disturbances, vomiting and weight loss. Last A1C was: 12/17/2015: Hgb  A1c MFr Bld 6.4.  He reports that usually he runs around 130-140.  His blood sugar never runs less than 115.     Patient is on Vitamin D supplement. 05/21/2015: Vit D, 25-Hydroxy 52  Patient reports that he has been ahving a really dry and scaling rash on his left knee.  He reports that it has been there for the last 2 weeks.  He reports that he has been trying lotion and has also been trying to use diabetic lotions without relief.  No history of eczema or psoriasis.   He has been having some issues with his toenails.  He reports that he is due to see them next week.      Current Medications:  Current Outpatient Prescriptions on File Prior to Visit  Medication Sig Dispense Refill  . acyclovir (ZOVIRAX) 400 MG tablet TAKE 1 BY MOUTH DAILY AS NEEDED FOR COLD SORES 90 tablet 1  . allopurinol (ZYLOPRIM) 300 MG tablet TAKE 1 BY MOUTH AT BEDTIME 90 tablet 1  . aspirin EC 81 MG tablet Take 81 mg by mouth daily.    . carbidopa-levodopa (SINEMET IR) 25-250 MG tablet TAKE 1 BY MOUTH TWICE DAILY FOR RESTLESS LEGS 180 tablet 1  . colchicine 0.6 MG tablet Take 1 tablet (0.6 mg total) by mouth 2 (two) times daily as needed (gout flare ups). 180 tablet 2  . DULoxetine (CYMBALTA) 60 MG capsule TAKE 1 BY MOUTH AT BEDTIME 90 capsule 1  . enalapril (VASOTEC) 20 MG tablet TAKE 1 BY MOUTH TWICE DAILY 180 tablet 1  . fexofenadine (ALLEGRA) 180 MG tablet Take 180 mg by mouth daily. AS NEEDED BASIS FOR SINUS    .  gabapentin (NEURONTIN) 300 MG capsule Take two (2) capsules by mouth three (3) times daily. 540 capsule 1  . glucose blood test strip Use as instructed 100 each 12  . guaiFENesin (MUCINEX) 600 MG 12 hr tablet Take 600 mg by mouth daily as needed.    . Magnesium 500 MG CAPS Take 500 mg by mouth every evening.     . meloxicam (MOBIC) 15 MG tablet   1  . metFORMIN (GLUCOPHAGE) 1000 MG tablet TAKE 1 BY MOUTH TWICE DAILY WITH A MEAL 180 tablet 1  . mometasone (NASONEX) 50 MCG/ACT nasal spray Place 2 sprays  into the nose daily. 17 g 2  . omega-3 acid ethyl esters (LOVAZA) 1 g capsule TAKE 2 BY MOUTH TWICE DAILY (Patient taking differently: Take 2 g by mouth 2 (two) times daily. ) 360 capsule 3  . ONETOUCH DELICA LANCETS 99991111 MISC CHECK BLOOD SUGAR PER  PROVIDER INSTRUCTIONS 400 each 1  . pantoprazole (PROTONIX) 40 MG tablet Take 1 tablet (40 mg total) by mouth daily. 90 tablet 1  . sitaGLIPtin (JANUVIA) 100 MG tablet Take 1 tablet (100 mg total) by mouth daily. 90 tablet 1  . Vitamin D, Ergocalciferol, (DRISDOL) 50000 units CAPS capsule Take 1 capsule (50,000 Units total) by mouth as directed. Tuesday , Wednesday, Thursday, Sunday (Patient taking differently: Take 50,000 Units by mouth 2 (two) times a week. Tuesday and Sunday) 90 capsule 1  . amitriptyline (ELAVIL) 25 MG tablet Take 1 tablet (25 mg total) by mouth at bedtime. 30 tablet 0   No current facility-administered medications on file prior to visit.    Medical History:  Past Medical History:  Diagnosis Date  . Acute meniscal tear of left knee   . Allergic rhinitis   . Arthritis     back  . Borderline glaucoma   . Chronic back pain    stenosis  . CKD (chronic kidney disease), stage II   . Complication of anesthesia cervical fusion surgery 06/2010   "woke up next day w/ventilator on" told due to OSA  . GERD (gastroesophageal reflux disease)    takes Prevacid daily  . Gout    takes Allopurinol daily and Colchicine if needed  . History of adenomatous polyp of colon    tubular adenoma's  . History of Barrett's esophagus   . History of hiatal hernia   . Hyperlipidemia   . Hypertension   . Incomplete right bundle branch block (RBBB)   . OA (osteoarthritis)    knee  . OSA on CPAP   . Peripheral neuropathy ([place])   . Prostate cancer [facility]) urologist-  dr Alinda Money   dx 2013  s/p  radical non-nerve sparing prostatectomy 2014--  Stage pT2cNx,  PSA 3.48,  Gleason 3+3,  vol 55cc/  current PSA  0.01 (Aug 2016)  . Thyroid nodule    left side   . Type II diabetes mellitus ([place])   . Urinary frequency    takes Flomax daily  . Urine incontinence   . Wears glasses    Allergies:  Allergies  Allergen Reactions  . Flaxseed [Linseed Oil] Rash  . Lyrica [Pregabalin] Itching and Rash  . Niaspan [Niacin Er] Rash     Review of Systems:  Review of Systems  Constitutional: Negative for chills, fever and malaise/fatigue.  HENT: Negative for congestion, ear pain and sore throat.   Eyes: Negative.   Respiratory: Negative for cough, shortness of breath and wheezing.   Cardiovascular: Positive for leg swelling. Negative for  chest pain and palpitations.  Gastrointestinal: Negative for abdominal pain, blood in stool, constipation, diarrhea, heartburn and melena.  Genitourinary: Negative.   Skin: Positive for rash.       Toenails increasing in length  Neurological: Negative for dizziness, sensory change, loss of consciousness and headaches.  Psychiatric/Behavioral: Negative for depression. The patient is not nervous/anxious and does not have insomnia.     Family history- Review and unchanged  Social history- Review and unchanged  Physical Exam: BP 118/66   Pulse (!) 104   Temp 98.2 F (36.8 C) (Temporal)   Resp 18   Ht 6\' 3"  (1.905 m)   Wt (!) 312 lb (141.5 kg)   BMI 39.00 kg/m  Wt Readings from Last 3 Encounters:  03/20/16 (!) 312 lb (141.5 kg)  12/17/15 300 lb (136.1 kg)  09/06/15 298 lb (135.2 kg)   General Appearance: Obese, pleasant, Well nourished well developed, non-toxic appearing, in no apparent distress. Eyes: PERRLA, EOMs, conjunctiva no swelling or erythema ENT/Mouth: Ear canals clear with no erythema, swelling, or discharge.  TMs normal bilaterally, oropharynx clear, moist, with no exudate.   Neck: Supple, thyroid normal, no JVD, no cervical adenopathy.  Respiratory: Respiratory effort normal, breath sounds clear A&P, no wheeze, rhonchi or rales noted.  No retractions, no accessory muscle usage Cardio: RRR with  no MRGs. 2+ peripheral edema bilaterally  Abdomen: Soft, + BS.  Non tender, no guarding, rebound, hernias, masses. Musculoskeletal: Full ROM, 5/5 strength, Normal gait Skin: Warm, dry without rashes, lesions, ecchymosis. Red serpiginious rash plaque like with some mild flaking and scaling.  Erythematous in color.  Blanches with pressure.   Neuro: Awake and oriented X 3, Cranial nerves intact. No cerebellar symptoms.  Psych: normal affect, Insight and Judgment appropriate.    Starlyn Skeans, PA-C 3:17 PM [facility]

## 2016-03-20 NOTE — Patient Instructions (Signed)
Please start using clobetasol twice daily on the rash on your leg.  Place vaseline on top of the clobetasol.  If it does not improve in 2 weeks let me know.  Please use lasix once daily until the swelling in your legs resolve.  Do not take after 5 pm.  I will try to have Katrina get you into the podiatrist earlier.  She will call you.  Start taking ranitidine twice daily to see if this resolves your chest pain related to GERD.

## 2016-03-21 LAB — HEPATIC FUNCTION PANEL
ALT: 32 U/L (ref 9–46)
AST: 22 U/L (ref 10–35)
Albumin: 4.3 g/dL (ref 3.6–5.1)
Alkaline Phosphatase: 75 U/L (ref 40–115)
Bilirubin, Direct: 0.1 mg/dL (ref <=0.2)
Indirect Bilirubin: 0.3 mg/dL (ref 0.2–1.2)
Total Bilirubin: 0.4 mg/dL (ref 0.2–1.2)
Total Protein: 6.6 g/dL (ref 6.1–8.1)

## 2016-03-21 LAB — LIPID PANEL
Cholesterol: 165 mg/dL (ref <200)
HDL: 23 mg/dL — ABNORMAL LOW (ref >40)
Total CHOL/HDL Ratio: 7.2 Ratio — ABNORMAL HIGH (ref <5.0)
Triglycerides: 472 mg/dL — ABNORMAL HIGH (ref <150)

## 2016-03-21 LAB — BASIC METABOLIC PANEL WITH GFR
BUN: 14 mg/dL (ref 7–25)
CO2: 25 mmol/L (ref 20–31)
Calcium: 9.8 mg/dL (ref 8.6–10.3)
Chloride: 105 mmol/L (ref 98–110)
Creat: 1.03 mg/dL (ref 0.70–1.25)
GFR, Est African American: 89 mL/min (ref >=60)
GFR, Est Non African American: 77 mL/min (ref >=60)
Glucose, Bld: 138 mg/dL — ABNORMAL HIGH (ref 65–99)
Potassium: 4.9 mmol/L (ref 3.5–5.3)
Sodium: 140 mmol/L (ref 135–146)

## 2016-03-21 LAB — HEMOGLOBIN A1C
Hgb A1c MFr Bld: 6.5 % — ABNORMAL HIGH (ref <5.7)
Mean Plasma Glucose: 140 mg/dL

## 2016-03-21 LAB — TSH: TSH: 3.12 mIU/L (ref 0.40–4.50)

## 2016-03-28 ENCOUNTER — Ambulatory Visit: Payer: Self-pay | Admitting: Internal Medicine

## 2016-03-30 ENCOUNTER — Ambulatory Visit (INDEPENDENT_AMBULATORY_CARE_PROVIDER_SITE_OTHER): Payer: Medicare Other | Admitting: Podiatry

## 2016-03-30 ENCOUNTER — Encounter: Payer: Self-pay | Admitting: Podiatry

## 2016-03-30 DIAGNOSIS — M79676 Pain in unspecified toe(s): Secondary | ICD-10-CM | POA: Diagnosis not present

## 2016-03-30 DIAGNOSIS — B351 Tinea unguium: Secondary | ICD-10-CM

## 2016-03-30 DIAGNOSIS — Q828 Other specified congenital malformations of skin: Secondary | ICD-10-CM

## 2016-03-30 DIAGNOSIS — E1142 Type 2 diabetes mellitus with diabetic polyneuropathy: Secondary | ICD-10-CM

## 2016-03-30 NOTE — Progress Notes (Signed)
He presents today with chief complaint of painful elongated toenails diabetic polyneuropathy E and hammertoe deformities.  Objective: Vital signs are stable is alert and oriented 3. Pulses are palpable. Reactive hyperkeratosis plantar aspect of bilateral forefoot. No open lesions or wounds are identified. Toenails are long thick yellow dystrophic with mycotic fourth toe left foot does demonstrate adductovarus rotation with irritation of the nail plate. It is thick and tender.  Assessment: Non-insulin-dependent diabetes mellitus with diabetic peripheral neuropathy hammertoe deformities porokeratotic lesions and painful elongated toenails.  Plan: Debridement of all reactive hyperkeratosis. Debridement of toenails 1 through 5 bilateral. We will do the paperwork starting his  Diabetic shoes.

## 2016-04-12 ENCOUNTER — Telehealth: Payer: Self-pay | Admitting: Internal Medicine

## 2016-04-12 NOTE — Telephone Encounter (Signed)
Patient called and requested Sam, needs to order patient assistance medications. Please return call

## 2016-04-13 ENCOUNTER — Other Ambulatory Visit: Payer: Self-pay | Admitting: *Deleted

## 2016-04-13 MED ORDER — ACCU-CHEK AVIVA PLUS W/DEVICE KIT: PACK | 0 refills | Status: DC

## 2016-04-13 MED ORDER — ACCU-CHEK MULTICLIX LANCETS MISC: 4 refills | Status: DC

## 2016-04-13 MED ORDER — FUROSEMIDE 40 MG PO TABS: 40.0000 mg | ORAL_TABLET | Freq: Every day | ORAL | 0 refills | Status: DC

## 2016-04-13 MED ORDER — GLUCOSE BLOOD VI STRP: ORAL_STRIP | 4 refills | Status: DC

## 2016-05-03 ENCOUNTER — Other Ambulatory Visit: Payer: Self-pay | Admitting: *Deleted

## 2016-05-03 MED ORDER — GLUCOSE BLOOD VI STRP: ORAL_STRIP | 4 refills | Status: DC

## 2016-05-23 ENCOUNTER — Encounter: Payer: Self-pay | Admitting: Internal Medicine

## 2016-05-26 DIAGNOSIS — I872 Venous insufficiency (chronic) (peripheral): Secondary | ICD-10-CM | POA: Diagnosis not present

## 2016-05-26 DIAGNOSIS — I8312 Varicose veins of left lower extremity with inflammation: Secondary | ICD-10-CM | POA: Diagnosis not present

## 2016-05-26 DIAGNOSIS — L300 Nummular dermatitis: Secondary | ICD-10-CM | POA: Diagnosis not present

## 2016-05-26 DIAGNOSIS — I8311 Varicose veins of right lower extremity with inflammation: Secondary | ICD-10-CM | POA: Diagnosis not present

## 2016-05-26 DIAGNOSIS — D692 Other nonthrombocytopenic purpura: Secondary | ICD-10-CM | POA: Diagnosis not present

## 2016-05-30 ENCOUNTER — Encounter: Payer: Self-pay | Admitting: *Deleted

## 2016-06-01 ENCOUNTER — Other Ambulatory Visit: Payer: Self-pay | Admitting: Internal Medicine

## 2016-06-07 NOTE — Progress Notes (Signed)
Patient ID: Jason Wyatt, male   DOB: 05/12/1955, 61 y.o.   MRN: [id number]  3 month follow up and Medicare wellness  Assessment and Plan  Atherosclerotic peripheral vascular disease ([place]) Control blood pressure, cholesterol, glucose, increase exercise.  -     Lipid panel -     Hemoglobin A1c  Essential hypertension - continue medications, DASH diet, exercise and monitor at home. Call if greater than 130/80.  -     CBC with Differential/Platelet -     BASIC METABOLIC PANEL WITH GFR -     Hepatic function panel -     TSH  Type 2 diabetes mellitus with other diabetic kidney complication, without long-term current use of insulin ([place]) Discussed general issues about diabetes pathophysiology and management., Educational material distributed., Suggested low cholesterol diet., Encouraged aerobic exercise., Discussed foot care., Reminded to get yearly retinal exam. -     Hemoglobin A1c  DM type 2 with diabetic peripheral neuropathy ([place]) Discussed general issues about diabetes pathophysiology and management., Educational material distributed., Suggested low cholesterol diet., Encouraged aerobic exercise., Discussed foot care., Reminded to get yearly retinal exam. -     Hemoglobin A1c  Morbid obesity (BMI 41+) - long discussion about weight loss, diet, and exercise  Mixed hyperlipidemia -continue medications, check lipids, decrease fatty foods, increase activity.  -     Lipid panel  Medication management -     Magnesium  OSA on CPAP Sleep apnea- continue CPAP, weight loss advised.   Primary osteoarthritis of left knee S/p replacement and doing well  CKD (chronic kidney disease), stage II Increase fluids, avoid NSAIDS, monitor sugars, will monitor -     BASIC METABOLIC PANEL WITH GFR  RLS Continue medications, check iron/B12, increase walking  Idiopathic gout, unspecified chronicity, unspecified site Gout- recheck Uric acid as needed, Diet discussed, continue  medications.  Anemia, unspecified type -     Iron and TIBC -     Vitamin B12  Vitamin D deficiency -     VITAMIN D 25 Hydroxy (Vit-D Deficiency, Fractures)  Encounter for Medicare annual wellness exam Due next year   During the course of the visit the patient was educated and counseled about appropriate screening and preventive services including:    Pneumococcal vaccine   Influenza vaccine  Td vaccine  Screening electrocardiogram  Screening mammography  Bone densitometry screening  Colorectal cancer screening  Diabetes screening  Glaucoma screening  Nutrition counseling   Advanced directives: given information/requested  Future Appointments Date Time Provider [place]  07/04/2016 8:30 AM [place], Jason [facility]att [facility]  10/10/2016 9:30 AM Jason Pinto, MD [facility] None  11/22/2016 9:00 AM Jason Mutters, Jason Wyatt [facility] None    HPI 61 y.o. male  presents for 3 month follow up with hypertension, hyperlipidemia, diabetes and vitamin D deficiency and medicare wellness visit.   Moved to townhouse, less land and doing well.    His blood pressure has been controlled at home, today their BP is BP: 134/80.He does workout. He denies chest pain, shortness of breath, dizziness.   He is on cholesterol medication and denies myalgias. His cholesterol is at goal. The cholesterol was:  03/20/2016: Cholesterol 165; HDL 23; LDL Cholesterol NOT CALC; Triglycerides 472   He has been working on diet and exercise for diabetes with diabetic chronic kidney disease, he is on bASA, he is on ACE/ARB, he does check his sugars at home, staying 130-140's, and denies  foot ulcerations, hyperglycemia, hypoglycemia , increased appetite, nausea, paresthesia  of the feet, polydipsia, polyuria, visual disturbances, vomiting and weight loss. Last A1C was: 03/20/2016: Hgb A1c MFr Bld 6.5   Patient is on Vitamin D supplement.  Patient is on allopurinol for gout and does not report  a recent flare.  Lab Results  Component Value Date   LABURIC 4.2 07/27/2014   BMI is Body mass index is 37.87 kg/m., he is working on diet and exercise. Wt Readings from Last 3 Encounters:  06/08/16 (!) 303 lb (137.4 kg)  03/20/16 (!) 312 lb (141.5 kg)  12/17/15 300 lb (136.1 kg)    Current Medications:  Current Outpatient Prescriptions on File Prior to Visit  Medication Sig Dispense Refill  . acyclovir (ZOVIRAX) 400 MG tablet TAKE 1 BY MOUTH DAILY AS NEEDED FOR COLD SORES 90 tablet 1  . allopurinol (ZYLOPRIM) 300 MG tablet TAKE 1 TABLET BY MOUTH AT  BEDTIME 90 tablet 0  . amitriptyline (ELAVIL) 25 MG tablet Take 1 tablet (25 mg total) by mouth at bedtime. 30 tablet 0  . aspirin EC 81 MG tablet Take 81 mg by mouth daily.    . Blood Glucose Monitoring Suppl (ACCU-CHEK AVIVA PLUS) w/Device KIT Check blood sugar 1 time daily-DX-E11.29 1 kit 0  . carbidopa-levodopa (SINEMET IR) 25-250 MG tablet TAKE 1 TABLET BY MOUTH TWO  TIMES DAILY FOR RESTLESS  LEGS 180 tablet 0  . clobetasol cream (TEMOVATE) 1.30 % Apply 1 application topically 2 (two) times daily. 30 g 0  . colchicine 0.6 MG tablet Take 1 tablet (0.6 mg total) by mouth 2 (two) times daily as needed (gout flare ups). 180 tablet 2  . DULoxetine (CYMBALTA) 60 MG capsule TAKE 1 BY MOUTH AT BEDTIME 90 capsule 1  . enalapril (VASOTEC) 20 MG tablet TAKE 1 TABLET BY MOUTH  TWICE A DAY 180 tablet 0  . fexofenadine (ALLEGRA) 180 MG tablet Take 180 mg by mouth daily. AS NEEDED BASIS FOR SINUS    . gabapentin (NEURONTIN) 300 MG capsule TAKE TWO CAPSULES BY MOUTH  THREE TIMES DAILY. 540 capsule 0  . glucose blood (ONE TOUCH ULTRA TEST) test strip CHECK BLOOD SUGAR 1 TIME DAILY-DX-E11.29 100 each 4  . guaiFENesin (MUCINEX) 600 MG 12 hr tablet Take 600 mg by mouth daily as needed.    . Lancets (ACCU-CHEK MULTICLIX) lancets Check blood sugar 1 time daily-E11.29 102 each 4  . Magnesium 500 MG CAPS Take 500 mg by mouth every evening.     . meloxicam  (MOBIC) 15 MG tablet   1  . metFORMIN (GLUCOPHAGE) 1000 MG tablet TAKE 1 TABLET BY MOUTH  TWICE A DAY WITH MEALS 180 tablet 0  . methocarbamol (ROBAXIN) 500 MG tablet Take 1 tablet (500 mg total) by mouth 3 (three) times daily. 90 tablet 0  . omega-3 acid ethyl esters (LOVAZA) 1 g capsule TAKE 2 BY MOUTH TWICE DAILY (Patient taking differently: Take 2 g by mouth 2 (two) times daily. ) 360 capsule 3  . pantoprazole (PROTONIX) 40 MG tablet TAKE 1 TABLET BY MOUTH  DAILY 90 tablet 0  . sitaGLIPtin (JANUVIA) 100 MG tablet Take 1 tablet (100 mg total) by mouth daily. 90 tablet 1  . Vitamin D, Ergocalciferol, (DRISDOL) 50000 units CAPS capsule Take 1 capsule (50,000 Units total) by mouth as directed. Tuesday , Wednesday, Thursday, Sunday (Patient taking differently: Take 50,000 Units by mouth 2 (two) times a week. Tuesday and Sunday) 90 capsule 1   No current facility-administered medications on file prior to visit.  Medical History:  Past Medical History:  Diagnosis Date  . Acute meniscal tear of left knee   . Allergic rhinitis   . Arthritis     back  . Borderline glaucoma   . Chronic back pain    stenosis  . CKD (chronic kidney disease), stage II   . Complication of anesthesia cervical fusion surgery 06/2010   "woke up next day w/ventilator on" told due to OSA  . GERD (gastroesophageal reflux disease)    takes Prevacid daily  . Gout    takes Allopurinol daily and Colchicine if needed  . History of adenomatous polyp of colon    tubular adenoma's  . History of Barrett's esophagus   . History of hiatal hernia   . Hyperlipidemia   . Hypertension   . Incomplete right bundle branch block (RBBB)   . OA (osteoarthritis)    knee  . OSA on CPAP   . Peripheral neuropathy   . Prostate cancer [facility]) urologist-  dr Alinda Money   dx 2013  s/p  radical non-nerve sparing prostatectomy 2014--  Stage pT2cNx,  PSA 3.48,  Gleason 3+3,  vol 55cc/  current PSA  0.01 (Aug 2016)  . Thyroid nodule    left side   . Type II diabetes mellitus ([place])   . Urinary frequency    takes Flomax daily  . Urine incontinence   . Wears glasses    Allergies Allergies  Allergen Reactions  . Flaxseed [Linseed Oil] Rash  . Lyrica [Pregabalin] Itching and Rash  . Niaspan [Niacin Er] Rash   Preventative Medicine Immunization History  Administered Date(s) Administered  . Influenza Split 10/17/2013, 10/29/2014  . Influenza, Seasonal, Injecte, Preservative Fre 11/29/2015  . Influenza-Unspecified 11/04/2012  . PPD Test 01/16/2006, 04/07/2013  . Pneumococcal Polysaccharide-23 04/07/2013  . Pneumococcal-Unspecified 09/17/2010  . Td 01/17/2011  . Zoster 01/16/2005   Colonoscopy 2008 EGD 2013 Echo 2013 CXR 2017  SURGICAL HISTORY He  has a past surgical history that includes Posterior fusion cervical spine (07-05-2010); Esophagogastroduodenoscopy (09/26/2011); Robot assisted laparoscopic radical prostatectomy (02/07/2012); wisdom teeth extracted  (1982); left and right heart catheterization with coronary angiogram (N/A, 08/25/2011); Anal fissure repair (06-05-2002); Cardiovascular stress test (01-01-2008); transthoracic echocardiogram (08-03-2011); Posterior lumbar fusion (05-18-2014); Uvulopalatopharyngoplasty (uppp)/tonsillectomy/septoplasty (1990's); Knee arthroscopy with medial menisectomy (Left, 12/31/2014); Cardiac catheterization; Back surgery; and Total knee arthroplasty (Left, 08/05/2015). FAMILY HISTORY His family history includes Cancer in his father; Dementia in his mother; Hyperlipidemia in his sister; Hypertension in his mother and sister; Osteoarthritis in his mother; Osteoporosis in his mother. SOCIAL HISTORY He  reports that he has never smoked. He has never used smokeless tobacco. He reports that he does not drink alcohol or use drugs.  MEDICARE WELLNESS OBJECTIVES: Physical activity: Current Exercise Habits: Home exercise routine, Type of exercise: walking, Time (Minutes): 30, Frequency (Times/Week):  5, Weekly Exercise (Minutes/Week): 150 Cardiac risk factors: Cardiac Risk Factors include: advanced age (>38mn, >>65women);diabetes mellitus;dyslipidemia;family history of premature cardiovascular disease;hypertension;male gender;obesity (BMI >30kg/m2);sedentary lifestyle Depression/mood screen:   [facility] 06/08[facility]  Decreased Interest 0  Down, Depressed, Hopeless 0  PHQ - 2 Score 0  Altered sleeping -  Tired, decreased energy -  Change in appetite -  Feeling bad or failure about yourself  -  Trouble concentrating -  Moving slowly or fidgety/restless -  Suicidal thoughts -  PHQ-9 Score -  Difficult doing work/chores -    ADLs:  In your present state of health, do you have any difficulty performing the following  activities: 06/08/2016 08/05/2015  Hearing? - N  Vision? N N  Difficulty concentrating or making decisions? N N  Walking or climbing stairs? N N  Dressing or bathing? N N  Doing errands, shopping? N N  Some recent data might be hidden     Cognitive Testing  Alert? Yes  Normal Appearance?Yes  Oriented to person? Yes  Place? Yes   Time? Yes  Recall of three objects?  Yes  Can perform simple calculations? Yes  Displays appropriate judgment?Yes  Can read the correct time from a watch face?Yes  EOL planning: Does Patient Have a Medical Advance Directive?: No Would patient like information on creating a medical advance directive?: Yes (ED - Information included in AVS)   Review of Systems:  Review of Systems  Constitutional: Negative for chills, diaphoresis, fever, malaise/fatigue and weight loss.  HENT: Negative for congestion, ear pain and sore throat.   Eyes: Negative.   Respiratory: Negative for cough, shortness of breath and wheezing.   Cardiovascular: Negative for chest pain, palpitations and leg swelling.  Gastrointestinal: Negative for abdominal pain, blood in stool, constipation, diarrhea, heartburn, melena, nausea and vomiting.   Genitourinary: Negative.   Musculoskeletal: Positive for joint pain.  Skin: Negative.   Neurological: Negative for dizziness, sensory change, loss of consciousness, weakness and headaches.  Endo/Heme/Allergies: Negative for polydipsia.  Psychiatric/Behavioral: Negative for depression. The patient is not nervous/anxious and does not have insomnia.     Physical Exam: BP 134/80   Pulse 67   Temp 97.5 F (36.4 C)   Resp 16   Ht 6' 3" (1.905 m)   Wt (!) 303 lb (137.4 kg)   SpO2 97%   BMI 37.87 kg/m  Wt Readings from Last 3 Encounters:  06/08/16 (!) 303 lb (137.4 kg)  03/20/16 (!) 312 lb (141.5 kg)  12/17/15 300 lb (136.1 kg)   General Appearance: Well nourished well developed, non-toxic appearing, in no apparent distress. Eyes: PERRLA, EOMs, conjunctiva no swelling or erythema ENT/Mouth: Ear canals clear with no erythema, swelling, or discharge.  TMs normal bilaterally, oropharynx clear, moist, with no exudate.   Neck: Supple, thyroid normal, no JVD, no cervical adenopathy.  Respiratory: Respiratory effort normal, breath sounds clear A&P, no wheeze, rhonchi or rales noted.  No retractions, no accessory muscle usage Cardio: RRR with no MRGs. No edema noted Abdomen: Soft, + BS, obese,  Non tender, no guarding, rebound, hernias, masses. Musculoskeletal: Full ROM, 5/5 strength, antalgic gait.  Skin: Warm, dry without rashes, lesions, ecchymosis.  Neuro: Awake and oriented X 3, Cranial nerves intact. No cerebellar symptoms.  Psych: normal affect, Insight and Judgment appropriate.   Medicare Attestation I have personally reviewed: The patient's medical and social history Their use of alcohol, tobacco or illicit drugs Their current medications and supplements The patient's functional ability including ADLs,fall risks, home safety risks, cognitive, and hearing and visual impairment Diet and physical activities Evidence for depression or mood disorders  The patient's weight, height,  BMI, and visual acuity have been recorded in the chart.  I have made referrals, counseling, and provided education to the patient based on review of the above and I have provided the patient with a written personalized care plan for preventive services.     Jason Mutters, Jason Wyatt 12:47 PM [facility]

## 2016-06-08 ENCOUNTER — Ambulatory Visit (INDEPENDENT_AMBULATORY_CARE_PROVIDER_SITE_OTHER): Payer: Medicare Other | Admitting: Physician Assistant

## 2016-06-08 ENCOUNTER — Encounter: Payer: Self-pay | Admitting: Physician Assistant

## 2016-06-08 VITALS — BP 134/80 | HR 67 | Temp 97.5°F | Resp 16 | Ht 75.0 in | Wt 303.0 lb

## 2016-06-08 DIAGNOSIS — E1129 Type 2 diabetes mellitus with other diabetic kidney complication: Secondary | ICD-10-CM

## 2016-06-08 DIAGNOSIS — D649 Anemia, unspecified: Secondary | ICD-10-CM | POA: Diagnosis not present

## 2016-06-08 DIAGNOSIS — I70209 Unspecified atherosclerosis of native arteries of extremities, unspecified extremity: Secondary | ICD-10-CM

## 2016-06-08 DIAGNOSIS — M1000 Idiopathic gout, unspecified site: Secondary | ICD-10-CM | POA: Diagnosis not present

## 2016-06-08 DIAGNOSIS — Z0001 Encounter for general adult medical examination with abnormal findings: Secondary | ICD-10-CM | POA: Diagnosis not present

## 2016-06-08 DIAGNOSIS — R6889 Other general symptoms and signs: Secondary | ICD-10-CM | POA: Diagnosis not present

## 2016-06-08 DIAGNOSIS — M1712 Unilateral primary osteoarthritis, left knee: Secondary | ICD-10-CM

## 2016-06-08 DIAGNOSIS — G4733 Obstructive sleep apnea (adult) (pediatric): Secondary | ICD-10-CM | POA: Diagnosis not present

## 2016-06-08 DIAGNOSIS — E559 Vitamin D deficiency, unspecified: Secondary | ICD-10-CM

## 2016-06-08 DIAGNOSIS — I10 Essential (primary) hypertension: Secondary | ICD-10-CM | POA: Diagnosis not present

## 2016-06-08 DIAGNOSIS — G2581 Restless legs syndrome: Secondary | ICD-10-CM | POA: Diagnosis not present

## 2016-06-08 DIAGNOSIS — Z0000 Encounter for general adult medical examination without abnormal findings: Secondary | ICD-10-CM

## 2016-06-08 DIAGNOSIS — E782 Mixed hyperlipidemia: Secondary | ICD-10-CM

## 2016-06-08 DIAGNOSIS — N182 Chronic kidney disease, stage 2 (mild): Secondary | ICD-10-CM | POA: Diagnosis not present

## 2016-06-08 DIAGNOSIS — E1142 Type 2 diabetes mellitus with diabetic polyneuropathy: Secondary | ICD-10-CM | POA: Diagnosis not present

## 2016-06-08 DIAGNOSIS — Z79899 Other long term (current) drug therapy: Secondary | ICD-10-CM

## 2016-06-08 DIAGNOSIS — Z9989 Dependence on other enabling machines and devices: Secondary | ICD-10-CM

## 2016-06-08 LAB — HEPATIC FUNCTION PANEL
ALT: 25 U/L (ref 9–46)
AST: 25 U/L (ref 10–35)
Albumin: 4.3 g/dL (ref 3.6–5.1)
Alkaline Phosphatase: 66 U/L (ref 40–115)
Bilirubin, Direct: 0.1 mg/dL (ref <=0.2)
Indirect Bilirubin: 0.4 mg/dL (ref 0.2–1.2)
Total Bilirubin: 0.5 mg/dL (ref 0.2–1.2)
Total Protein: 6.8 g/dL (ref 6.1–8.1)

## 2016-06-08 LAB — BASIC METABOLIC PANEL WITH GFR
BUN: 16 mg/dL (ref 7–25)
CO2: 22 mmol/L (ref 20–31)
Calcium: 9.4 mg/dL (ref 8.6–10.3)
Chloride: 105 mmol/L (ref 98–110)
Creat: 1.04 mg/dL (ref 0.70–1.25)
GFR, Est African American: 88 mL/min (ref >=60)
GFR, Est Non African American: 76 mL/min (ref >=60)
Glucose, Bld: 135 mg/dL — ABNORMAL HIGH (ref 65–99)
Potassium: 4.6 mmol/L (ref 3.5–5.3)
Sodium: 139 mmol/L (ref 135–146)

## 2016-06-08 LAB — LIPID PANEL
Cholesterol: 160 mg/dL (ref <200)
HDL: 29 mg/dL — ABNORMAL LOW (ref >40)
LDL Cholesterol: 90 mg/dL (ref <100)
Total CHOL/HDL Ratio: 5.5 Ratio — ABNORMAL HIGH (ref <5.0)
Triglycerides: 203 mg/dL — ABNORMAL HIGH (ref <150)
VLDL: 41 mg/dL — ABNORMAL HIGH (ref <30)

## 2016-06-08 LAB — CBC WITH DIFFERENTIAL/PLATELET
Basophils Absolute: 73 cells/uL (ref 0–200)
Basophils Relative: 1 %
Eosinophils Absolute: 146 cells/uL (ref 15–500)
Eosinophils Relative: 2 %
HCT: 40.6 % (ref 38.5–50.0)
Hemoglobin: 13.9 g/dL (ref 13.2–17.1)
Lymphocytes Relative: 20 %
Lymphs Abs: 1460 cells/uL (ref 850–3900)
MCH: 30.3 pg (ref 27.0–33.0)
MCHC: 34.2 g/dL (ref 32.0–36.0)
MCV: 88.6 fL (ref 80.0–100.0)
MPV: 9.7 fL (ref 7.5–12.5)
Monocytes Absolute: 584 cells/uL (ref 200–950)
Monocytes Relative: 8 %
Neutro Abs: 5037 cells/uL (ref 1500–7800)
Neutrophils Relative %: 69 %
Platelets: 281 10*3/uL (ref 140–400)
RBC: 4.58 MIL/uL (ref 4.20–5.80)
RDW: 16.8 % — ABNORMAL HIGH (ref 11.0–15.0)
WBC: 7.3 10*3/uL (ref 3.8–10.8)

## 2016-06-08 LAB — IRON AND TIBC
%SAT: 18 % (ref 15–60)
Iron: 70 ug/dL (ref 50–180)
TIBC: 400 ug/dL (ref 250–425)
UIBC: 330 ug/dL

## 2016-06-08 LAB — TSH: TSH: 2.38 mIU/L (ref 0.40–4.50)

## 2016-06-08 NOTE — Patient Instructions (Signed)
Call around to pharmacy to see if cymbalta and [place] is cheaper  We want weight loss that will last so you should lose 1-2 pounds a week.  THAT IS IT! Please pick THREE things a month to change. Once it is a habit check off the item. Then pick another three items off the list to become habits.  If you are already doing a habit on the list GREAT!  Cross that item off! o Don't drink your calories. Ie, alcohol, soda, fruit juice, and sweet tea.  o Drink more water. Drink a glass when you feel hungry or before each meal.  o Eat breakfast - Complex carb and protein (likeDannon light and fit yogurt, oatmeal, fruit, eggs, [place] bacon). o Measure your cereal.  Eat no more than one cup a day. (ie [place]) o Eat an apple a day. o Add a vegetable a day. o Try a new vegetable a month. o Use Pam! Stop using oil or butter to cook. o Don't finish your plate or use smaller plates. o Share your dessert. o Eat sugar free Jello for dessert or frozen grapes. o Don't eat 2-3 hours before bed. o Switch to whole wheat bread, pasta, and brown rice. o Make healthier choices when you eat out. No fries! o Pick baked chicken, NOT fried. o Don't forget to SLOW DOWN when you eat. It is not going anywhere.  o Take the stairs. o Park far away in the parking lot o [employer] (or weights) for 10 minutes while watching TV. o Walk at work for 10 minutes during break. o Walk outside 1 time a week with your friend, kids, dog, or significant other. o Start a walking group at [place] the mall as much as you can tolerate.  o Keep a food diary. o Weigh yourself daily. o Walk for 15 minutes 3 days per week. o Cook at home more often and eat out less.  If life happens and you go back to old habits, it is okay.  Just start over. You can do it!   If you experience chest pain, get short of breath, or tired during the exercise, please stop immediately and inform your doctor.    Simple math prevails.    1st -  exercise does not produce significant weight loss - at best one converts fat into muscle , "bulks up", loses inches, but usually stays "weight neutral"     2nd - think of your body weightas a check book: If you eat more calories than you burn up - you save money or gain weight .... Or if you spend more money than you put in the check book, ie burn up more calories than you eat, then you lose weight     3rd - if you walk or run 1 mile, you burn up 100 calories - you have to burn up 3,500 calories to lose 1 pound, ie you have to walk/run 35 miles to lose 1 measly pound. So if you want to lose 10 #, then you have to walk/run 350 miles, so.... clearly exercise is not the solution.     4. So if you consume 1,500 calories, then you have to burn up the equivalent of 15 miles to stay weight neutral - It also stands to reason that if you consume 1,500 cal/day and don't lose weight, then you must be burning up about 1,500 cals/day to stay weight neutral.     5. If you really want  to lose weight, you must cut your calorie intake 300 calories /day and at that rate you should lose about 1 # every 3 days.   6. Please purchase Dr Fara Olden Fuhrman's book(s) "The End of Dieting" & "Eat to Live" . It has some great concepts and recipes.

## 2016-06-09 LAB — VITAMIN D 25 HYDROXY (VIT D DEFICIENCY, FRACTURES): Vit D, 25-Hydroxy: 89 ng/mL (ref 30–100)

## 2016-06-09 LAB — VITAMIN B12: Vitamin B-12: 415 pg/mL (ref 200–1100)

## 2016-06-09 LAB — HEMOGLOBIN A1C
Hgb A1c MFr Bld: 6.6 % — ABNORMAL HIGH (ref <5.7)
Mean Plasma Glucose: 143 mg/dL

## 2016-06-09 LAB — MAGNESIUM: Magnesium: 1.8 mg/dL (ref 1.5–2.5)

## 2016-06-15 ENCOUNTER — Other Ambulatory Visit: Payer: Self-pay | Admitting: Internal Medicine

## 2016-06-19 ENCOUNTER — Other Ambulatory Visit: Payer: Self-pay | Admitting: Internal Medicine

## 2016-07-04 ENCOUNTER — Ambulatory Visit: Payer: Medicare Other | Admitting: Podiatry

## 2016-07-05 ENCOUNTER — Encounter: Payer: Self-pay | Admitting: Internal Medicine

## 2016-07-11 DIAGNOSIS — Z96652 Presence of left artificial knee joint: Secondary | ICD-10-CM | POA: Diagnosis not present

## 2016-07-18 ENCOUNTER — Ambulatory Visit (INDEPENDENT_AMBULATORY_CARE_PROVIDER_SITE_OTHER): Payer: Medicare Other | Admitting: Podiatry

## 2016-07-18 ENCOUNTER — Encounter: Payer: Self-pay | Admitting: Podiatry

## 2016-07-18 DIAGNOSIS — B351 Tinea unguium: Secondary | ICD-10-CM

## 2016-07-18 DIAGNOSIS — Q828 Other specified congenital malformations of skin: Secondary | ICD-10-CM

## 2016-07-18 DIAGNOSIS — M79676 Pain in unspecified toe(s): Secondary | ICD-10-CM | POA: Diagnosis not present

## 2016-07-18 DIAGNOSIS — E1142 Type 2 diabetes mellitus with diabetic polyneuropathy: Secondary | ICD-10-CM

## 2016-07-18 NOTE — Progress Notes (Signed)
He presents them chief complaint of painful elongated toenails multiple callosities distal aspect of the toes and plantar aspect of the foot. States that his blood sugar is currently running good. He still has numbness in his toes he says. He states that he is taking his medication regularly but the numbness is now subsided.  Objective: Vital signs are stable he is alert and oriented 3 pulses remain palpable. Neuropathic symptoms persist bilaterally. Pain in limb secondary to thick yellow dystrophic clinic mycotic toenails and multiple distal clavi. No open lesions or wounds are noted.  Assessment: Porokeratosis pain limb secondary to onychomycosis hammertoe deformity. Diabetes mellitus with diabetic peripheral neuropathy.  Plan: Debridement of toenails 1 through 5 bilateral. Debridement of all reactive hyperkeratosis bilateral. We reinitiated the paperwork for their shoes.

## 2016-08-03 ENCOUNTER — Ambulatory Visit: Payer: Medicare Other | Admitting: Orthotics

## 2016-08-06 NOTE — Progress Notes (Signed)
  Subjective:    Patient ID: Jason Wyatt, male    DOB: 08-10-1955, 61 y.o.   MRN: [id number]  HPI   This very nice 61 y.o. MWM  with Hypertension, Hyperlipidemia, T2_DM and Vitamin D Deficiency presents for the purpose of medicare requirement of 2sd opinion validating need for Diabetic shoes. Progress notes from podiatry office indicates patient has "Pain in limb secondary to thick yellow dystrophic clinic mycotic toenails and multiple distal clavi. No open lesions or wounds are noted." with a dx/o "Porokeratosis pain limb secondary to onychomycosis hammertoe deformity. Diabetes mellitus with diabetic peripheral neuropathy." Patient received treatment of "Debridement of toenails 1 through 5 bilateral. Debridement of all reactive hyperkeratosis bilateral."     Patient has HTN since 1973 without known hx/o CAD or ASPVD.     The patient has history of T2_NIDDM predating since 2004 w/CKD2 (GFR 78) & has had no symptoms of reactive hypoglycemia, diabetic polys or visual blurring, but does have stinging paresthesias of the distal LE's. Dietary compliance is poor as is consequently diabetic control. Lab Results  Component Value Date   HGBA1C 6.6 (H) 06/08/2016   Review of Systems  10 point systems review negative except as above.    Objective:   Physical Exam  BP 124/84   Pulse 96   Temp 97.9 F (36.6 C)   Resp 16   Ht 6\' 3"  (1.905 m)   Wt (!) 303 lb 3.2 oz (137.5 kg)   BMI 37.90 kg/m   Central Obesity.   HEENT - WNL. Neck - supple.  Chest - Clear equal BS. Cor - Nl HS. RRR w/o sig MGR. PP 2(+) and bounding. No edema. MS- FROM w/o deformities.  Gait Nl. Neuro - Sensation decreased  in a stocking distribution from the knees distally  to touch, vibratory and Monofilament to the toes bilaterally. Skin - calluses noted of the soles.    Assessment & Plan:   1. Essential hypertension   2. DM type 2 with diabetic peripheral neuropathy ([place])

## 2016-08-07 ENCOUNTER — Other Ambulatory Visit: Payer: Self-pay | Admitting: Physician Assistant

## 2016-08-07 ENCOUNTER — Encounter: Payer: Self-pay | Admitting: Internal Medicine

## 2016-08-07 ENCOUNTER — Ambulatory Visit (INDEPENDENT_AMBULATORY_CARE_PROVIDER_SITE_OTHER): Payer: Medicare Other | Admitting: Internal Medicine

## 2016-08-07 VITALS — BP 124/84 | HR 96 | Temp 97.9°F | Resp 16 | Ht 75.0 in | Wt 303.2 lb

## 2016-08-07 DIAGNOSIS — I10 Essential (primary) hypertension: Secondary | ICD-10-CM

## 2016-08-07 DIAGNOSIS — E1142 Type 2 diabetes mellitus with diabetic polyneuropathy: Secondary | ICD-10-CM | POA: Diagnosis not present

## 2016-08-07 NOTE — Patient Instructions (Signed)
Diabetic Neuropathy Diabetic neuropathy is a nerve disease or nerve damage that is caused by diabetes mellitus. About half of all people with diabetes mellitus have some form of nerve damage. Nerve damage is more common in those who have had diabetes mellitus for many years and who generally have not had good control of their blood sugar (glucose) level. Diabetic neuropathy is a common complication of diabetes mellitus. There are three common types of diabetic neuropathy and a fourth type that is less common and less understood:  Peripheral neuropathy-This is the most common type of diabetic neuropathy. It causes damage to the nerves of the feet and legs first and then eventually the hands and arms. The damage affects the ability to sense touch.  Autonomic neuropathy-This type causes damage to the autonomic nervous system, which controls the following functions: ? Heartbeat. ? Body temperature. ? Blood pressure. ? Urination. ? Digestion. ? Sweating. ? Sexual function.  Focal neuropathy-Focal neuropathy can be painful and unpredictable and occurs most often in older adults with diabetes mellitus. It involves a specific nerve or one area and often comes on suddenly. It usually does not cause long-term problems.  Radiculoplexus neuropathy- Sometimes called lumbosacral radiculoplexus neuropathy, radiculoplexus neuropathy affects the nerves of the thighs, hips, buttocks, or legs. It is more common in people with type 2 diabetes mellitus and in older men. It is characterized by debilitating pain, weakness, and atrophy, usually in the thigh muscles.  What are the causes? The cause of peripheral, autonomic, and focal neuropathies is diabetes mellitus that is uncontrolled and high glucose levels. The cause of radiculoplexus neuropathy is unknown. However, it is thought to be caused by inflammation related to uncontrolled glucose levels. What are the signs or symptoms? Peripheral Neuropathy Peripheral  neuropathy develops slowly over time. When the nerves of the feet and legs no longer work there may be:  Burning, stabbing, or aching pain in the legs or feet.  Inability to feel pressure or pain in your feet. This can lead to: ? Thick calluses over pressure areas. ? Pressure sores. ? Ulcers.  Foot deformities.  Reduced ability to feel temperature changes.  Muscle weakness.  Autonomic Neuropathy The symptoms of autonomic neuropathy vary depending on which nerves are affected. Symptoms may include:  Problems with digestion, such as: ? Feeling sick to your stomach (nausea). ? Vomiting. ? Bloating. ? Constipation. ? Diarrhea. ? Abdominal pain.  Difficulty with urination. This occurs if you lose your ability to sense when your bladder is full. Problems include: ? Urine leakage (incontinence). ? Inability to empty your bladder completely (retention).  Rapid or irregular heartbeat (palpitations).  Blood pressure drops when you stand up (orthostatic hypotension). When you stand up you may feel: ? Dizzy. ? Weak. ? Faint.  In men, inability to attain and maintain an erection.  In women, vaginal dryness and problems with decreased sexual desire and arousal.  Problems with body temperature regulation.  Increased or decreased sweating.  Focal Neuropathy  Abnormal eye movements or abnormal alignment of both eyes.  Weakness in the wrist.  Foot drop. This results in an inability to lift the foot properly and abnormal walking or foot movement.  Paralysis on one side of your face (Bell palsy).  Chest or abdominal pain. Radiculoplexus Neuropathy  Sudden, severe pain in your hip, thigh, or buttocks.  Weakness and wasting of thigh muscles.  Difficulty rising from a seated position.  Abdominal swelling.  Unexplained weight loss (usually more than 10 lb [4.5 kg]). How is   this diagnosed? Peripheral Neuropathy Your senses may be tested. Sensory function testing can be  done with:  A light touch using a monofilament.  A vibration with tuning fork.  A sharp sensation with a pin prick.  Other tests that can help diagnose neuropathy are:  Nerve conduction velocity. This test checks the transmission of an electrical current through a nerve.  Electromyography. This shows how muscles respond to electrical signals transmitted by nearby nerves.  Quantitative sensory testing. This is used to assess how your nerves respond to vibrations and changes in temperature.  Autonomic Neuropathy Diagnosis is often based on reported symptoms. Tell your health care provider if you experience:  Dizziness.  Constipation.  Diarrhea.  Inappropriate urination or inability to urinate.  Inability to get or maintain an erection.  Tests that may be done include:  Electrocardiography or Holter monitor. These are tests that can help show problems with the heart rate or heart rhythm.  An X-ray exam may be done.  Focal Neuropathy Diagnosis is made based on your symptoms and what your health care provider finds during your exam. Other tests may be done. They may include:  Nerve conduction velocities. This checks the transmission of electrical current through a nerve.  Electromyography. This shows how muscles respond to electrical signals transmitted by nearby nerves.  Quantitative sensory testing. This test is used to assess how your nerves respond to vibration and changes in temperature.  Radiculoplexus Neuropathy  Often the first thing is to eliminate any other issue or problems that might be the cause, as there is no standard test for diagnosis.  X-ray exam of your spine and lumbar region.  Spinal tap to rule out cancer.  MRI to rule out other lesions. How is this treated? Once nerve damage occurs, it cannot be reversed. The goal of treatment is to keep the disease or nerve damage from getting worse and affecting more nerve fibers. Controlling your blood  glucose level is the key. Most people with radiculoplexus neuropathy see at least a partial improvement over time. You will need to keep your blood glucose and HbA1c levels in the target range determined by your health care provider. Things that help control blood glucose levels include:  Blood glucose monitoring.  Meal planning.  Physical activity.  Diabetes medicine.  Over time, maintaining lower blood glucose levels helps lessen symptoms. Sometimes, prescription pain medicine is needed. Follow these instructions at home:  Do not smoke.  Keep your blood glucose level in the range that you and your health care provider have determined acceptable for you.  Keep your blood pressure level in the range that you and your health care provider have determined acceptable for you.  Eat a well-balanced diet.  Be physically active every day. Include strength training and balance exercises.  Protect your feet. ? Check your feet every day for sores, cuts, blisters, or signs of infection. ? Wear padded socks and supportive shoes. Use orthotic inserts, if necessary. ? Regularly check the insides of your shoes for worn spots. Make sure there are no rocks or other items inside your shoes before you put them on. Contact a health care provider if:  You have burning, stabbing, or aching pain in the legs or feet.  You are unable to feel pressure or pain in your feet.  You develop problems with digestion such as: ? Nausea. ? Vomiting. ? Bloating. ? Constipation. ? Diarrhea. ? Abdominal pain.  You have difficulty with urination, such as: ? Incontinence. ? Retention.    You have palpitations.  You develop orthostatic hypotension. When you stand up you may feel: ? Dizzy. ? Weak. ? Faint.  You cannot attain and maintain an erection (in men).  You have vaginal dryness and problems with decreased sexual desire and arousal (in women).  You have severe pain in your thighs, legs, or  buttocks.  You have unexplained weight loss. This information is not intended to replace advice given to you by your health care provider. Make sure you discuss any questions you have with your health care provider. Document Released: 03/13/2001 Document Revised: 06/10/2015 Document Reviewed: 06/13/2012 Elsevier Interactive Patient Education  2017 [employer]vier Inc.  

## 2016-08-08 ENCOUNTER — Other Ambulatory Visit: Payer: Medicare Other | Admitting: Orthotics

## 2016-08-15 ENCOUNTER — Encounter: Payer: Self-pay | Admitting: Internal Medicine

## 2016-09-05 ENCOUNTER — Other Ambulatory Visit: Payer: Medicare Other | Admitting: Orthotics

## 2016-10-02 DIAGNOSIS — G4733 Obstructive sleep apnea (adult) (pediatric): Secondary | ICD-10-CM | POA: Diagnosis not present

## 2016-10-10 ENCOUNTER — Ambulatory Visit (INDEPENDENT_AMBULATORY_CARE_PROVIDER_SITE_OTHER): Payer: Medicare Other | Admitting: Orthotics

## 2016-10-10 ENCOUNTER — Ambulatory Visit: Payer: Self-pay | Admitting: Internal Medicine

## 2016-10-10 DIAGNOSIS — E1142 Type 2 diabetes mellitus with diabetic polyneuropathy: Secondary | ICD-10-CM

## 2016-10-10 DIAGNOSIS — M2010 Hallux valgus (acquired), unspecified foot: Secondary | ICD-10-CM | POA: Diagnosis not present

## 2016-10-10 DIAGNOSIS — Q828 Other specified congenital malformations of skin: Secondary | ICD-10-CM | POA: Diagnosis not present

## 2016-10-10 NOTE — Progress Notes (Signed)

## 2016-10-13 DIAGNOSIS — Z471 Aftercare following joint replacement surgery: Secondary | ICD-10-CM | POA: Diagnosis not present

## 2016-10-13 DIAGNOSIS — M62451 Contracture of muscle, right thigh: Secondary | ICD-10-CM | POA: Diagnosis not present

## 2016-10-13 DIAGNOSIS — Z96652 Presence of left artificial knee joint: Secondary | ICD-10-CM | POA: Diagnosis not present

## 2016-10-13 DIAGNOSIS — M62452 Contracture of muscle, left thigh: Secondary | ICD-10-CM | POA: Diagnosis not present

## 2016-10-19 ENCOUNTER — Ambulatory Visit: Payer: Medicare Other | Admitting: Podiatry

## 2016-10-24 ENCOUNTER — Ambulatory Visit (INDEPENDENT_AMBULATORY_CARE_PROVIDER_SITE_OTHER): Payer: Medicare Other | Admitting: Podiatry

## 2016-10-24 ENCOUNTER — Encounter: Payer: Self-pay | Admitting: Podiatry

## 2016-10-24 DIAGNOSIS — E1142 Type 2 diabetes mellitus with diabetic polyneuropathy: Secondary | ICD-10-CM

## 2016-10-24 DIAGNOSIS — B351 Tinea unguium: Secondary | ICD-10-CM | POA: Diagnosis not present

## 2016-10-24 DIAGNOSIS — M79676 Pain in unspecified toe(s): Secondary | ICD-10-CM | POA: Diagnosis not present

## 2016-10-24 DIAGNOSIS — Q828 Other specified congenital malformations of skin: Secondary | ICD-10-CM | POA: Diagnosis not present

## 2016-10-24 LAB — HM DIABETES FOOT EXAM

## 2016-10-24 NOTE — Progress Notes (Signed)
He presents today with a history of diabetes and diabetic peripheral neuropathy complaining of painful elongated toenails.  Objective: Vital signs are stable he is alert and oriented 3 pulses are palpable. Neurologic sensorium is diminished per Semmes-Weinstein monofilament. Deep tendon reflexes are intact toenails are thick yellow dystrophic onychomycotic multiple reactive hyperkeratosis plantar aspect bilateral foot and distal toes.  Assessment: Pain and limb secondary to onychomycosis soft tissue lesions porokeratosis and diabetic peripheral neuropathy.  Plan: Debridement of toenails 1 through 5 bilateral and debridement of reactive hyperkeratosis plantar aspect bilateral foot.

## 2016-10-26 DIAGNOSIS — M25562 Pain in left knee: Secondary | ICD-10-CM | POA: Diagnosis not present

## 2016-10-31 DIAGNOSIS — R3912 Poor urinary stream: Secondary | ICD-10-CM | POA: Diagnosis not present

## 2016-11-01 DIAGNOSIS — M25562 Pain in left knee: Secondary | ICD-10-CM | POA: Diagnosis not present

## 2016-11-03 DIAGNOSIS — M25562 Pain in left knee: Secondary | ICD-10-CM | POA: Diagnosis not present

## 2016-11-06 DIAGNOSIS — M25562 Pain in left knee: Secondary | ICD-10-CM | POA: Diagnosis not present

## 2016-11-09 DIAGNOSIS — M25562 Pain in left knee: Secondary | ICD-10-CM | POA: Diagnosis not present

## 2016-11-14 DIAGNOSIS — M25562 Pain in left knee: Secondary | ICD-10-CM | POA: Diagnosis not present

## 2016-11-16 DIAGNOSIS — M25562 Pain in left knee: Secondary | ICD-10-CM | POA: Diagnosis not present

## 2016-11-17 ENCOUNTER — Other Ambulatory Visit: Payer: Self-pay | Admitting: Internal Medicine

## 2016-11-21 DIAGNOSIS — M25562 Pain in left knee: Secondary | ICD-10-CM | POA: Diagnosis not present

## 2016-11-22 ENCOUNTER — Encounter: Payer: Self-pay | Admitting: Physician Assistant

## 2016-11-22 ENCOUNTER — Ambulatory Visit (INDEPENDENT_AMBULATORY_CARE_PROVIDER_SITE_OTHER): Payer: Medicare Other | Admitting: Physician Assistant

## 2016-11-22 VITALS — BP 132/80 | HR 68 | Temp 97.5°F | Resp 16 | Ht 73.5 in | Wt 307.6 lb

## 2016-11-22 DIAGNOSIS — E785 Hyperlipidemia, unspecified: Secondary | ICD-10-CM

## 2016-11-22 DIAGNOSIS — Z79899 Other long term (current) drug therapy: Secondary | ICD-10-CM

## 2016-11-22 DIAGNOSIS — Z1159 Encounter for screening for other viral diseases: Secondary | ICD-10-CM

## 2016-11-22 DIAGNOSIS — E782 Mixed hyperlipidemia: Secondary | ICD-10-CM | POA: Diagnosis not present

## 2016-11-22 DIAGNOSIS — Z23 Encounter for immunization: Secondary | ICD-10-CM

## 2016-11-22 DIAGNOSIS — Z136 Encounter for screening for cardiovascular disorders: Secondary | ICD-10-CM

## 2016-11-22 DIAGNOSIS — I70209 Unspecified atherosclerosis of native arteries of extremities, unspecified extremity: Secondary | ICD-10-CM

## 2016-11-22 DIAGNOSIS — E1142 Type 2 diabetes mellitus with diabetic polyneuropathy: Secondary | ICD-10-CM

## 2016-11-22 DIAGNOSIS — E1129 Type 2 diabetes mellitus with other diabetic kidney complication: Secondary | ICD-10-CM

## 2016-11-22 DIAGNOSIS — R350 Frequency of micturition: Secondary | ICD-10-CM | POA: Diagnosis not present

## 2016-11-22 DIAGNOSIS — Z0001 Encounter for general adult medical examination with abnormal findings: Secondary | ICD-10-CM

## 2016-11-22 DIAGNOSIS — N182 Chronic kidney disease, stage 2 (mild): Secondary | ICD-10-CM | POA: Diagnosis not present

## 2016-11-22 DIAGNOSIS — Z0000 Encounter for general adult medical examination without abnormal findings: Secondary | ICD-10-CM

## 2016-11-22 DIAGNOSIS — G4733 Obstructive sleep apnea (adult) (pediatric): Secondary | ICD-10-CM

## 2016-11-22 DIAGNOSIS — N401 Enlarged prostate with lower urinary tract symptoms: Secondary | ICD-10-CM

## 2016-11-22 DIAGNOSIS — I10 Essential (primary) hypertension: Secondary | ICD-10-CM

## 2016-11-22 DIAGNOSIS — M1000 Idiopathic gout, unspecified site: Secondary | ICD-10-CM

## 2016-11-22 DIAGNOSIS — G2581 Restless legs syndrome: Secondary | ICD-10-CM

## 2016-11-22 DIAGNOSIS — E1169 Type 2 diabetes mellitus with other specified complication: Secondary | ICD-10-CM

## 2016-11-22 DIAGNOSIS — Z9989 Dependence on other enabling machines and devices: Secondary | ICD-10-CM

## 2016-11-22 DIAGNOSIS — Z6841 Body Mass Index (BMI) 40.0 and over, adult: Secondary | ICD-10-CM

## 2016-11-22 MED ORDER — TAMSULOSIN HCL 0.4 MG PO CAPS: 0.4000 mg | ORAL_CAPSULE | Freq: Every day | ORAL | 3 refills | Status: DC

## 2016-11-22 NOTE — Patient Instructions (Addendum)
Follow up with Dr. Einar Gip for your eye exam  Our lower leg venous system is not the most reliable, the heart does NOT pump fluid up, there is a valve system.  The muscles of the leg squeeze and the blood moves up and a valve opens and close, then they squeeze, blood moves up and valves open and close.  Lots can go wrong with this valve system.  If someone is sitting or standing without movement, everyone will get swelling.  HOME CARE INSTRUCTIONS   Do not stand or sit in one position for long periods of time. Do not sit with your legs crossed. Rest with your legs raised during the day.  Your legs have to be higher than your heart so that gravity will force the valves to open, so please really elevate your legs.   Wear elastic stockings or support hose. Do not wear other tight, encircling garments around the legs, pelvis, or waist.  [employer]  has a wide variety of well priced compression stockings. [place], [place] [phone]  Or can get on amazon to order compression socks, below the knee  Walk as much as possible to increase blood flow.  Raise the foot of your bed at night with 2-inch blocks. SEEK MEDICAL CARE IF:   The skin around your ankle starts to break down.  You have pain, redness, tenderness, or hard swelling developing in your leg over a vein.  You are uncomfortable due to leg pain. Document Released: 10/12/2004 Document Revised: 03/27/2011 Document Reviewed: 02/28/2010 [facility] Patient Information 2014 [place].  Get stress ball and squeeze it Put hand under warm water and squeeze hand If worse or started triggering will send to ortho     Bad carbs also include fruit juice, alcohol, and sweet tea. These are empty calories that do not signal to your brain that you are full.   Please remember the good carbs are still carbs which convert into sugar. So please measure them out no more than 1/2-1 cup of rice, oatmeal, pasta,  and beans  Veggies are however free foods! Pile them on.   Not all fruit is created equal. Please see the list below, the fruit at the bottom is higher in sugars than the fruit at the top. Please avoid all dried fruits.

## 2016-11-22 NOTE — Progress Notes (Signed)
Patient ID: Jason Wyatt, male   DOB: 10-14-1955, 61 y.o.   MRN: [id number]  3 month follow up and CPE  Assessment and Plan  Atherosclerotic peripheral vascular disease ([place]) Control blood pressure, cholesterol, glucose, increase exercise.  -     Lipid panel -     Hemoglobin A1c  Essential hypertension - continue medications, DASH diet, exercise and monitor at home. Call if greater than 130/80.  -     CBC with Differential/Platelet -     BASIC METABOLIC PANEL WITH GFR -     Hepatic function panel -     TSH  Type 2 diabetes mellitus with other diabetic kidney complication, without long-term current use of insulin ([place]) Discussed general issues about diabetes pathophysiology and management., Educational material distributed., Suggested low cholesterol diet., Encouraged aerobic exercise., Discussed foot care., Reminded to get yearly retinal exam. -     Hemoglobin A1c  DM type 2 with diabetic peripheral neuropathy ([place]) Discussed general issues about diabetes pathophysiology and management., Educational material distributed., Suggested low cholesterol diet., Encouraged aerobic exercise., Discussed foot care., Reminded to get yearly retinal exam. -     Hemoglobin A1c  Morbid obesity (BMI 41+) - long discussion about weight loss, diet, and exercise  Mixed hyperlipidemia -continue medications, check lipids, decrease fatty foods, increase activity.  -     Lipid panel  Medication management -     Magnesium  OSA on CPAP Sleep apnea- continue CPAP, weight loss advised.   Primary osteoarthritis of left knee S/p replacement and doing well  CKD (chronic kidney disease), stage II Increase fluids, avoid NSAIDS, monitor sugars, will monitor -     BASIC METABOLIC PANEL WITH GFR  RLS Continue medications, check iron/B12, increase walking  Idiopathic gout, unspecified chronicity, unspecified site Gout- recheck Uric acid as needed, Diet discussed, continue medications.  Vitamin D  deficiency -     VITAMIN D 25 Hydroxy (Vit-D Deficiency, Fractures)  Type 2 diabetes mellitus with hyperlipidemia ([facility]) Discussed general issues about diabetes pathophysiology and management., Educational material distributed., Suggested low cholesterol diet., Encouraged aerobic exercise., Discussed foot care., Reminded to get yearly retinal exam.   CKD (chronic kidney disease), stage II Increase fluids, avoid NSAIDS, monitor sugars, will monitor -     BASIC METABOLIC PANEL WITH GFR -     Iron,Total/Total Iron Binding Cap  Encounter for general adult medical examination with abnormal findings 1 year  Needs flu shot -     FLU VACCINE MDCK QUAD W/Preservative  Need for prophylactic vaccination against Streptococcus pneumoniae (pneumococcus) -     Pneumococcal polysaccharide vaccine 23-valent greater than or equal to 2yo subcutaneous/IM  Screening for viral disease -     Hepatitis C antibody -     HIV antibody  Benign prostatic hyperplasia with urinary frequency -     PSA -     tamsulosin (FLOMAX) 0.4 MG CAPS capsule; Take 1 capsule (0.4 mg total) daily after supper by mouth.   Future Appointments  Date Time Provider [place]  01/23/2017  8:15 AM [place], Bennie Pierini T, [place] [facility]  12/04/2017  9:00 AM Vicie Mutters, PA-C [facility] None    HPI 61 y.o. male  presents for 3 month follow up with hypertension, hyperlipidemia, diabetes and vitamin D deficiency and CPE.  Moved to townhouse, less land and doing well.    His blood pressure has been controlled at home, today their BP is BP: 132/80.He does workout. He denies chest pain, shortness of breath, dizziness.  He is on cholesterol medication and denies myalgias. His cholesterol is at goal. The cholesterol was:  06/08/2016: Cholesterol 160; HDL 29; LDL Cholesterol 90; Triglycerides 203   He has been working on diet and exercise for diabetes with diabetic chronic kidney disease and nueropathy, he is on bASA, he is  on ACE/ARB, he does check his sugars at home, staying 130-140's, and denies  foot ulcerations, hyperglycemia, hypoglycemia , increased appetite, nausea, paresthesia of the feet, polydipsia, polyuria, visual disturbances, vomiting and weight loss. Last A1C was: 06/08/2016: Hgb A1c MFr Bld 6.6   Patient is on Vitamin D supplement. Lab Results  Component Value Date   VD25OH 89 06/08/2016   Patient is on allopurinol, 1/2 tablet for gout and does not report a recent flare.  Lab Results  Component Value Date   LABURIC 4.2 07/27/2014   He is on tamsulosin for BPH.  BMI is Body mass index is 40.03 kg/m., he is working on diet and exercise. He is on CPAP. He is on sinemet for RLS.   Wt Readings from Last 3 Encounters:  11/22/16 (!) 307 lb 9.6 oz (139.5 kg)  08/07/16 (!) 303 lb 3.2 oz (137.5 kg)  06/08/16 (!) 303 lb (137.4 kg)    Current Medications:  Current Outpatient Medications on File Prior to Visit  Medication Sig Dispense Refill  . acyclovir (ZOVIRAX) 400 MG tablet TAKE 1 TABLET BY MOUTH  DAILY AS NEEDED FOR COLD  SORES 90 tablet 3  . allopurinol (ZYLOPRIM) 300 MG tablet TAKE 1 TABLET BY MOUTH AT  BEDTIME 90 tablet 1  . amitriptyline (ELAVIL) 25 MG tablet Take 1 tablet (25 mg total) by mouth at bedtime. 30 tablet 0  . aspirin EC 81 MG tablet Take 81 mg by mouth daily.    . Blood Glucose Monitoring Suppl (ACCU-CHEK AVIVA PLUS) w/Device KIT Check blood sugar 1 time daily-DX-E11.29 1 kit 0  . carbidopa-levodopa (SINEMET IR) 25-250 MG tablet TAKE 1 TABLET BY MOUTH TWO  TIMES DAILY FOR RESTLESS  LEGS 180 tablet 1  . clobetasol cream (TEMOVATE) 1.69 % Apply 1 application topically 2 (two) times daily. 30 g 0  . colchicine 0.6 MG tablet Take 1 tablet (0.6 mg total) by mouth 2 (two) times daily as needed (gout flare ups). 180 tablet 2  . DULoxetine (CYMBALTA) 60 MG capsule TAKE 1 CAPSULE BY MOUTH AT  BEDTIME 90 capsule 3  . enalapril (VASOTEC) 20 MG tablet TAKE 1 TABLET BY MOUTH  TWICE A DAY 180  tablet 1  . fexofenadine (ALLEGRA) 180 MG tablet Take 180 mg by mouth daily. AS NEEDED BASIS FOR SINUS    . gabapentin (NEURONTIN) 300 MG capsule TAKE TWO CAPSULES BY MOUTH  THREE TIMES DAILY. 540 capsule 1  . glucose blood (ONE TOUCH ULTRA TEST) test strip CHECK BLOOD SUGAR 1 TIME DAILY-DX-E11.29 100 each 4  . guaiFENesin (MUCINEX) 600 MG 12 hr tablet Take 600 mg by mouth daily as needed.    [name] JANUVIA 100 MG tablet TAKE 1 TABLET BY MOUTH  DAILY 90 tablet 1  . Lancets (ACCU-CHEK MULTICLIX) lancets Check blood sugar 1 time daily-E11.29 102 each 4  . Magnesium 500 MG CAPS Take 500 mg by mouth every evening.     . meloxicam (MOBIC) 15 MG tablet   1  . metFORMIN (GLUCOPHAGE) 1000 MG tablet TAKE 1 TABLET BY MOUTH  TWICE A DAY WITH MEALS 180 tablet 1  . methocarbamol (ROBAXIN) 500 MG tablet Take 1 tablet (500 mg total)  by mouth 3 (three) times daily. 90 tablet 0  . omega-3 acid ethyl esters (LOVAZA) 1 g capsule TAKE 2 BY MOUTH TWICE DAILY (Patient taking differently: Take 2 g by mouth 2 (two) times daily. ) 360 capsule 3  . pantoprazole (PROTONIX) 40 MG tablet TAKE 1 TABLET BY MOUTH  DAILY 90 tablet 1  . tamsulosin (FLOMAX) 0.4 MG CAPS capsule Take 0.4 mg by mouth.    . Vitamin D, Ergocalciferol, (DRISDOL) 50000 units CAPS capsule Take 1 capsule (50,000 Units total) by mouth as directed. Tuesday , Wednesday, Thursday, Sunday (Patient taking differently: Take 50,000 Units by mouth 2 (two) times a week. Tuesday and Sunday) 90 capsule 1   No current facility-administered medications on file prior to visit.    Medical History:  Past Medical History:  Diagnosis Date  . Acute meniscal tear of left knee   . Allergic rhinitis   . Arthritis     back  . Borderline glaucoma   . Chronic back pain    stenosis  . CKD (chronic kidney disease), stage II   . Complication of anesthesia cervical fusion surgery 06/2010   "woke up next day w/ventilator on" told due to OSA  . GERD (gastroesophageal reflux disease)     takes Prevacid daily  . Gout    takes Allopurinol daily and Colchicine if needed  . History of adenomatous polyp of colon    tubular adenoma's  . History of Barrett's esophagus   . History of hiatal hernia   . Hyperlipidemia   . Hypertension   . Incomplete right bundle branch block (RBBB)   . OA (osteoarthritis)    knee  . OSA on CPAP   . Peripheral neuropathy   . Prostate cancer [facility]) urologist-  dr Alinda Money   dx 2013  s/p  radical non-nerve sparing prostatectomy 2014--  Stage pT2cNx,  PSA 3.48,  Gleason 3+3,  vol 55cc/  current PSA  0.01 (Aug 2016)  . Thyroid nodule    left side  . Type II diabetes mellitus ([place])   . Urinary frequency    takes Flomax daily  . Urine incontinence   . Wears glasses    Allergies Allergies  Allergen Reactions  . Flaxseed [Linseed Oil] Rash  . Lyrica [Pregabalin] Itching and Rash  . Niaspan [Niacin Er] Rash   Preventative Medicine Immunization History  Administered Date(s) Administered  . Influenza Split 10/17/2013, 10/29/2014  . Influenza, Seasonal, Injecte, Preservative Fre 11/29/2015  . Influenza-Unspecified 11/04/2012  . PPD Test 01/16/2006, 04/07/2013  . Pneumococcal Polysaccharide-23 04/07/2013  . Pneumococcal-Unspecified 09/17/2010  . Td 01/17/2011  . Zoster 01/16/2005   TDAP 2013 Pneumonia 2015 prevnar due 52 Influenza TODAY Shingles 2007 PPD 2015  Colonoscopy 08/07/2009 due 2021 EGD 2013 Echo 2013 Stress test 2009 CXR 2017  DM eye exam 11/26/2015 no retinopathy Dr. Einar Gip Foot exam Dr. [name] 10/24/2016  Patient Care Team: Unk Pinto, MD as PCP - General (Internal Medicine) Garrel Ridgel, DPM as Consulting Physician (Podiatry) Nicki Reaper, Jonetta Speak., MD (Dentistry) Webb Laws, [place] as Referring Physician (Optometry) [place], Peter M, MD as Consulting Physician (Cardiology) Ronald Lobo, MD as Consulting Physician (Gastroenterology) Susa Day, MD as Consulting Physician (Orthopedic  Surgery) Rolan Bucco, MD as Consulting Physician (Urology)   SURGICAL HISTORY He  has a past surgical history that includes Posterior fusion cervical spine (07-05-2010); wisdom teeth extracted  (1982); Anal fissure repair (06-05-2002); Cardiovascular stress test (01-01-2008); transthoracic echocardiogram (08-03-2011); Posterior lumbar fusion (05-18-2014); Uvulopalatopharyngoplasty (uppp)/tonsillectomy/septoplasty (1990's); Cardiac catheterization; Back  surgery; LEFT TOTAL KNEE ARTHROPLASTY (Left, 08/05/2015); LEFT KNEE ARTHROSCOPY, PARTIAL MEDIAL AND LATERAL  MENISECTOMIES WITH DEDRIDEMENT (Left, 12/31/2014); ROBOTIC ASSISTED LAPAROSCOPIC RADICAL PROSTATECTOMY LEVEL 1 (N/A, 02/07/2012); ESOPHAGOGASTRODUODENOSCOPY (EGD) (N/A, 09/26/2011); and LEFT AND RIGHT HEART CATHETERIZATION WITH CORONARY ANGIOGRAM (N/A, 08/25/2011).   FAMILY HISTORY His family history includes Cancer in his father; Dementia in his mother; Hyperlipidemia in his sister; Hypertension in his mother and sister; Osteoarthritis in his mother; Osteoporosis in his mother.   SOCIAL HISTORY He  reports that  has never smoked. he has never used smokeless tobacco. He reports that he does not drink alcohol or use drugs.   Review of Systems:  Review of Systems  Constitutional: Negative for chills, diaphoresis, fever, malaise/fatigue and weight loss.  HENT: Negative for congestion, ear pain and sore throat.   Eyes: Negative.   Respiratory: Negative for cough, shortness of breath and wheezing.   Cardiovascular: Negative for chest pain, palpitations and leg swelling.  Gastrointestinal: Negative for abdominal pain, blood in stool, constipation, diarrhea, heartburn, melena, nausea and vomiting.  Genitourinary: Negative.   Musculoskeletal: Positive for joint pain.  Skin: Negative.   Neurological: Negative for dizziness, sensory change, loss of consciousness, weakness and headaches.  Endo/Heme/Allergies: Negative for polydipsia.   Psychiatric/Behavioral: Negative for depression. The patient is not nervous/anxious and does not have insomnia.     Physical Exam: BP 132/80   Pulse 68   Temp (!) 97.5 F (36.4 C)   Resp 16   Ht 6' 1.5" (1.867 m)   Wt (!) 307 lb 9.6 oz (139.5 kg)   SpO2 98%   BMI 40.03 kg/m  Wt Readings from Last 3 Encounters:  11/22/16 (!) 307 lb 9.6 oz (139.5 kg)  08/07/16 (!) 303 lb 3.2 oz (137.5 kg)  06/08/16 (!) 303 lb (137.4 kg)   General Appearance: Well nourished well developed, non-toxic appearing, in no apparent distress. Eyes: PERRLA, EOMs, conjunctiva no swelling or erythema ENT/Mouth: Ear canals clear with no erythema, swelling, or discharge.  TMs normal bilaterally, oropharynx clear, moist, with no exudate.   Neck: Supple, thyroid normal, no JVD, no cervical adenopathy.  Respiratory: Respiratory effort normal, breath sounds clear A&P, no wheeze, rhonchi or rales noted.  No retractions, no accessory muscle usage Cardio: RRR with no MRGs. No edema noted Abdomen: Soft, + BS, obese,  Non tender, no guarding, rebound, hernias, masses. Musculoskeletal: Full ROM, 5/5 strength, antalgic gait.  Skin: Warm, dry without rashes, lesions, ecchymosis.  Neuro: Awake and oriented X 3, Cranial nerves intact. No cerebellar symptoms.  Psych: normal affect, Insight and Judgment appropriate.     Vicie Mutters, PA-C 9:13 AM [facility]

## 2016-11-23 DIAGNOSIS — M25562 Pain in left knee: Secondary | ICD-10-CM | POA: Diagnosis not present

## 2016-11-23 LAB — URINALYSIS, ROUTINE W REFLEX MICROSCOPIC
Bacteria, UA: NONE SEEN /HPF
Bilirubin Urine: NEGATIVE
Glucose, UA: NEGATIVE
Hgb urine dipstick: NEGATIVE
Hyaline Cast: NONE SEEN /LPF
Ketones, ur: NEGATIVE
Leukocytes, UA: NEGATIVE
Nitrite: NEGATIVE
RBC / HPF: NONE SEEN /HPF (ref 0–2)
Specific Gravity, Urine: 1.024 (ref 1.001–1.03)
Squamous Epithelial / LPF: NONE SEEN /HPF (ref <=5)
WBC, UA: NONE SEEN /HPF (ref 0–5)
pH: 7.5 (ref 5.0–8.0)

## 2016-11-23 LAB — CBC WITH DIFFERENTIAL/PLATELET
Basophils Absolute: 68 cells/uL (ref 0–200)
Basophils Relative: 1 %
Eosinophils Absolute: 150 cells/uL (ref 15–500)
Eosinophils Relative: 2.2 %
HCT: 39.7 % (ref 38.5–50.0)
Hemoglobin: 13.6 g/dL (ref 13.2–17.1)
Lymphs Abs: 1428 cells/uL (ref 850–3900)
MCH: 29.6 pg (ref 27.0–33.0)
MCHC: 34.3 g/dL (ref 32.0–36.0)
MCV: 86.5 fL (ref 80.0–100.0)
MPV: 9.7 fL (ref 7.5–12.5)
Monocytes Relative: 7.7 %
Neutro Abs: 4631 cells/uL (ref 1500–7800)
Neutrophils Relative %: 68.1 %
Platelets: 294 10*3/uL (ref 140–400)
RBC: 4.59 10*6/uL (ref 4.20–5.80)
RDW: 14.3 % (ref 11.0–15.0)
Total Lymphocyte: 21 %
WBC mixed population: 524 cells/uL (ref 200–950)
WBC: 6.8 10*3/uL (ref 3.8–10.8)

## 2016-11-23 LAB — URIC ACID: Uric Acid, Serum: 6.4 mg/dL (ref 4.0–8.0)

## 2016-11-23 LAB — BASIC METABOLIC PANEL WITH GFR
BUN: 16 mg/dL (ref 7–25)
CO2: 26 mmol/L (ref 20–32)
Calcium: 9.5 mg/dL (ref 8.6–10.3)
Chloride: 103 mmol/L (ref 98–110)
Creat: 0.97 mg/dL (ref 0.70–1.25)
GFR, Est African American: 96 mL/min/{1.73_m2} (ref >=60)
GFR, Est Non African American: 83 mL/min/{1.73_m2} (ref >=60)
Glucose, Bld: 128 mg/dL — ABNORMAL HIGH (ref 65–99)
Potassium: 4.7 mmol/L (ref 3.5–5.3)
Sodium: 138 mmol/L (ref 135–146)

## 2016-11-23 LAB — HEPATIC FUNCTION PANEL
AG Ratio: 1.6 (calc) (ref 1.0–2.5)
ALT: 19 U/L (ref 9–46)
AST: 25 U/L (ref 10–35)
Albumin: 4.3 g/dL (ref 3.6–5.1)
Alkaline phosphatase (APISO): 69 U/L (ref 40–115)
Bilirubin, Direct: 0.1 mg/dL (ref 0.0–0.2)
Globulin: 2.7 g/dL (calc) (ref 1.9–3.7)
Indirect Bilirubin: 0.4 mg/dL (calc) (ref 0.2–1.2)
Total Bilirubin: 0.5 mg/dL (ref 0.2–1.2)
Total Protein: 7 g/dL (ref 6.1–8.1)

## 2016-11-23 LAB — MICROALBUMIN / CREATININE URINE RATIO
Creatinine, Urine: 141 mg/dL (ref 20–320)
Microalb Creat Ratio: 48 mcg/mg creat — ABNORMAL HIGH (ref <30)
Microalb, Ur: 6.7 mg/dL

## 2016-11-23 LAB — IRON, TOTAL/TOTAL IRON BINDING CAP
%SAT: 15 % (calc) (ref 15–60)
Iron: 60 ug/dL (ref 50–180)
TIBC: 398 mcg/dL (calc) (ref 250–425)

## 2016-11-23 LAB — LIPID PANEL
Cholesterol: 173 mg/dL (ref <200)
HDL: 31 mg/dL — ABNORMAL LOW (ref >40)
LDL Cholesterol (Calc): 102 mg/dL (calc) — ABNORMAL HIGH
Non-HDL Cholesterol (Calc): 142 mg/dL (calc) — ABNORMAL HIGH (ref <130)
Total CHOL/HDL Ratio: 5.6 (calc) — ABNORMAL HIGH (ref <5.0)
Triglycerides: 283 mg/dL — ABNORMAL HIGH (ref <150)

## 2016-11-23 LAB — HIV ANTIBODY (ROUTINE TESTING W REFLEX): HIV 1&2 Ab, 4th Generation: NONREACTIVE

## 2016-11-23 LAB — HEPATITIS C ANTIBODY
Hepatitis C Ab: NONREACTIVE
SIGNAL TO CUT-OFF: 0.02 (ref <1.00)

## 2016-11-23 LAB — PSA: PSA: <0.1 ng/mL (ref <=4.0)

## 2016-11-23 LAB — HEMOGLOBIN A1C
Hgb A1c MFr Bld: 6.8 % of total Hgb — ABNORMAL HIGH (ref <5.7)
Mean Plasma Glucose: 148 (calc)
eAG (mmol/L): 8.2 (calc)

## 2016-11-23 LAB — TSH: TSH: 2.05 mIU/L (ref 0.40–4.50)

## 2016-11-23 LAB — MAGNESIUM: Magnesium: 1.8 mg/dL (ref 1.5–2.5)

## 2016-11-27 DIAGNOSIS — E119 Type 2 diabetes mellitus without complications: Secondary | ICD-10-CM | POA: Diagnosis not present

## 2016-11-27 LAB — HM DIABETES EYE EXAM

## 2016-12-11 ENCOUNTER — Encounter: Payer: Self-pay | Admitting: Internal Medicine

## 2017-01-05 DIAGNOSIS — G4733 Obstructive sleep apnea (adult) (pediatric): Secondary | ICD-10-CM | POA: Diagnosis not present

## 2017-01-18 ENCOUNTER — Other Ambulatory Visit: Payer: Self-pay | Admitting: Physician Assistant

## 2017-01-23 ENCOUNTER — Encounter: Payer: Self-pay | Admitting: Podiatry

## 2017-01-23 ENCOUNTER — Ambulatory Visit: Payer: Medicare Other | Admitting: Podiatry

## 2017-01-23 DIAGNOSIS — B351 Tinea unguium: Secondary | ICD-10-CM | POA: Diagnosis not present

## 2017-01-23 DIAGNOSIS — Q828 Other specified congenital malformations of skin: Secondary | ICD-10-CM

## 2017-01-23 DIAGNOSIS — M79676 Pain in unspecified toe(s): Secondary | ICD-10-CM | POA: Diagnosis not present

## 2017-01-23 DIAGNOSIS — E1142 Type 2 diabetes mellitus with diabetic polyneuropathy: Secondary | ICD-10-CM | POA: Diagnosis not present

## 2017-01-23 NOTE — Progress Notes (Signed)
He presents today chief complaint of painful elongated toenails with multiple corns and calluses to the plantar aspect of the foot.  Objective: Vital signs are stable he is alert oriented x3 pulses remain palpable multiple keratosis to the plantar aspect of the bilateral foot with long thick yellow dystrophic clinically mycotic nails and hammertoe deformities.  Assessment: Diabetes pain in limb secondary to onychomycosis diabetic peripheral neuropathy porokeratosis and painful elongated toenails.  Plan: Debridement of toenails 1 through 5 bilateral debridement of all reactive hyperkeratosis plantar aspect of the bilateral foot.

## 2017-02-03 ENCOUNTER — Other Ambulatory Visit: Payer: Self-pay | Admitting: Physician Assistant

## 2017-02-21 DIAGNOSIS — M545 Low back pain: Secondary | ICD-10-CM | POA: Diagnosis not present

## 2017-02-21 DIAGNOSIS — M4326 Fusion of spine, lumbar region: Secondary | ICD-10-CM | POA: Diagnosis not present

## 2017-02-28 DIAGNOSIS — M545 Low back pain: Secondary | ICD-10-CM | POA: Diagnosis not present

## 2017-02-28 DIAGNOSIS — M4326 Fusion of spine, lumbar region: Secondary | ICD-10-CM | POA: Diagnosis not present

## 2017-02-28 DIAGNOSIS — M5106 Intervertebral disc disorders with myelopathy, lumbar region: Secondary | ICD-10-CM | POA: Diagnosis not present

## 2017-03-01 ENCOUNTER — Other Ambulatory Visit: Payer: Self-pay | Admitting: Orthopaedic Surgery

## 2017-03-01 DIAGNOSIS — M4326 Fusion of spine, lumbar region: Secondary | ICD-10-CM

## 2017-03-03 ENCOUNTER — Other Ambulatory Visit: Payer: Self-pay | Admitting: Physician Assistant

## 2017-03-17 ENCOUNTER — Ambulatory Visit
Admission: RE | Admit: 2017-03-17 | Discharge: 2017-03-17 | Disposition: A | Discharge status: Home / Self Care | Payer: Self-pay | Source: Ambulatory Visit | Attending: Orthopaedic Surgery | Admitting: Orthopaedic Surgery

## 2017-03-17 DIAGNOSIS — M4326 Fusion of spine, lumbar region: Secondary | ICD-10-CM

## 2017-03-17 DIAGNOSIS — M48061 Spinal stenosis, lumbar region without neurogenic claudication: Secondary | ICD-10-CM | POA: Diagnosis not present

## 2017-03-21 DIAGNOSIS — M5136 Other intervertebral disc degeneration, lumbar region: Secondary | ICD-10-CM | POA: Diagnosis not present

## 2017-03-21 DIAGNOSIS — M4326 Fusion of spine, lumbar region: Secondary | ICD-10-CM | POA: Diagnosis not present

## 2017-03-21 DIAGNOSIS — M545 Low back pain: Secondary | ICD-10-CM | POA: Diagnosis not present

## 2017-03-30 ENCOUNTER — Ambulatory Visit (INDEPENDENT_AMBULATORY_CARE_PROVIDER_SITE_OTHER): Payer: Medicare Other | Admitting: Internal Medicine

## 2017-03-30 ENCOUNTER — Encounter: Payer: Self-pay | Admitting: Internal Medicine

## 2017-03-30 VITALS — BP 142/90 | HR 72 | Temp 97.5°F | Resp 18 | Ht 73.5 in | Wt 318.4 lb

## 2017-03-30 DIAGNOSIS — Z6841 Body Mass Index (BMI) 40.0 and over, adult: Secondary | ICD-10-CM | POA: Diagnosis not present

## 2017-03-30 DIAGNOSIS — K219 Gastro-esophageal reflux disease without esophagitis: Secondary | ICD-10-CM | POA: Diagnosis not present

## 2017-03-30 DIAGNOSIS — N182 Chronic kidney disease, stage 2 (mild): Secondary | ICD-10-CM | POA: Diagnosis not present

## 2017-03-30 DIAGNOSIS — M1000 Idiopathic gout, unspecified site: Secondary | ICD-10-CM

## 2017-03-30 DIAGNOSIS — E559 Vitamin D deficiency, unspecified: Secondary | ICD-10-CM | POA: Diagnosis not present

## 2017-03-30 DIAGNOSIS — E1122 Type 2 diabetes mellitus with diabetic chronic kidney disease: Secondary | ICD-10-CM | POA: Diagnosis not present

## 2017-03-30 DIAGNOSIS — I10 Essential (primary) hypertension: Secondary | ICD-10-CM

## 2017-03-30 DIAGNOSIS — Z79899 Other long term (current) drug therapy: Secondary | ICD-10-CM

## 2017-03-30 DIAGNOSIS — N401 Enlarged prostate with lower urinary tract symptoms: Secondary | ICD-10-CM

## 2017-03-30 DIAGNOSIS — E782 Mixed hyperlipidemia: Secondary | ICD-10-CM | POA: Diagnosis not present

## 2017-03-30 DIAGNOSIS — E1142 Type 2 diabetes mellitus with diabetic polyneuropathy: Secondary | ICD-10-CM

## 2017-03-30 NOTE — Patient Instructions (Signed)

## 2017-03-30 NOTE — Progress Notes (Signed)
This very nice 62 y.o. MWM presents for 3 month follow up with HTN, HLD, Pre-Diabetes and Vitamin D Deficiency. Patient has OSA reporting 100% compliance with CPAP and improved sleep hygiene and restorative sleep with less daytime hypersomnolence. GERD sx's controlled with meds.    Gout is controlled with meds.                                                                                                                                                                                                                                          Patient is treated for HTN (1973) & BP has been controlled at home. Today's BP is slightly  Elevated at 142/90. Patient has had no complaints of any cardiac type chest pain, palpitations, dyspnea / orthopnea / PND, dizziness, claudication, or dependent edema.     Hyperlipidemia is controlled with diet & meds. Patient denies myalgias or other med SE's. Last Lipids were  Lab Results  Component Value Date   CHOL 173 11/22/2016   HDL 31 (L) 11/22/2016   LDLCALC 102 (H) 11/22/2016   TRIG 283 (H) 11/22/2016   CHOLHDL 5.6 (H) 11/22/2016      Also, the patient has history of Morbid Obesity (BMI 41+) and consequent T2_NIDDM (2004) w/CKD2 & he alleges FBG's range 110-150 mg%.  and has had no symptoms of reactive hypoglycemia, diabetic polys or visual blurring, but doe s have stinging paresthesias of feet Last A1c was not at goal: Lab Results  Component Value Date   HGBA1C 6.8 (H) 11/22/2016      Further, the patient also has history of Vitamin D Deficiency and supplements vitamin D without any suspected side-effects. Last vitamin D was   Lab Results  Component Value Date   VD25OH 89 06/08/2016   Current Outpatient Medications on File Prior to Visit  Medication Sig  . acyclovir (ZOVIRAX) 400 MG tablet TAKE 1 TABLET BY MOUTH  DAILY AS NEEDED FOR COLD  SORES  . allopurinol (ZYLOPRIM) 300 MG tablet TAKE 1 TABLET BY MOUTH AT  BEDTIME  . aspirin EC 81 MG tablet  Take 81 mg by mouth daily.  . blood glucose meter kit and supplies KIT by Does not apply route daily as needed. Dispense based on patient and insurance preference. Use up to four times daily as directed. (FOR ICD-9 250.00, 250.01).  . carbidopa-levodopa (SINEMET IR) 25-250  MG tablet TAKE 1 TABLET BY MOUTH TWO  TIMES DAILY FOR RESTLESS  LEGS  . clobetasol cream (TEMOVATE) 6.56 % Apply 1 application topically 2 (two) times daily.  . colchicine 0.6 MG tablet Take 1 tablet (0.6 mg total) by mouth 2 (two) times daily as needed (gout flare ups).  . DULoxetine (CYMBALTA) 60 MG capsule TAKE 1 CAPSULE BY MOUTH AT  BEDTIME  . enalapril (VASOTEC) 20 MG tablet TAKE 1 TABLET BY MOUTH  TWICE A DAY  . fexofenadine (ALLEGRA) 180 MG tablet Take 180 mg by mouth daily. AS NEEDED BASIS FOR SINUS  . gabapentin (NEURONTIN) 300 MG capsule TAKE TWO CAPSULES BY MOUTH  THREE TIMES DAILY.  [name] glucose blood (ONE TOUCH ULTRA TEST) test strip CHECK BLOOD SUGAR 1 TIME DAILY-DX-E11.29  . glucose blood test strip 1 each by Other route as needed for other. Use as instructed  . guaiFENesin (MUCINEX) 600 MG 12 hr tablet Take 600 mg by mouth daily as needed.  [name] JANUVIA 100 MG tablet TAKE 1 TABLET BY MOUTH  DAILY  . Magnesium 500 MG CAPS Take 500 mg by mouth every evening.   . meloxicam (MOBIC) 15 MG tablet   . metFORMIN (GLUCOPHAGE) 1000 MG tablet TAKE 1 TABLET BY MOUTH  TWICE A DAY WITH MEALS  . methocarbamol (ROBAXIN) 500 MG tablet Take 1 tablet (500 mg total) by mouth 3 (three) times daily.  [name] omega-3 acid ethyl esters (LOVAZA) 1 g capsule TAKE 2 BY MOUTH TWICE DAILY (Patient taking differently: Take 2 g by mouth 2 (two) times daily. )  . pantoprazole (PROTONIX) 40 MG tablet TAKE 1 TABLET BY MOUTH  DAILY  . tamsulosin (FLOMAX) 0.4 MG CAPS capsule Take 1 capsule (0.4 mg total) daily after supper by mouth.  . Vitamin D, Ergocalciferol, (DRISDOL) 50000 units CAPS capsule Take 1 capsule (50,000 Units total) by mouth as directed.  Tuesday , Wednesday, Thursday, Sunday (Patient taking differently: Take 50,000 Units by mouth 2 (two) times a week. Tuesday and Sunday)   No current facility-administered medications on file prior to visit.    Allergies  Allergen Reactions  . Flaxseed [Linseed Oil] Rash  . Lyrica [Pregabalin] Itching and Rash  . Niaspan [Niacin Er] Rash   PMHx:   Past Medical History:  Diagnosis Date  . Acute meniscal tear of left knee   . Allergic rhinitis   . Arthritis     back  . Borderline glaucoma   . Chronic back pain    stenosis  . CKD (chronic kidney disease), stage II   . Complication of anesthesia cervical fusion surgery 06/2010   "woke up next day w/ventilator on" told due to OSA  . GERD (gastroesophageal reflux disease)    takes Prevacid daily  . Gout    takes Allopurinol daily and Colchicine if needed  . History of adenomatous polyp of colon    tubular adenoma's  . History of Barrett's esophagus   . History of hiatal hernia   . Hyperlipidemia   . Hypertension   . Incomplete right bundle branch block (RBBB)   . OA (osteoarthritis)    knee  . OSA on CPAP   . Peripheral neuropathy   . Prostate cancer [facility]) urologist-  dr Alinda Money   dx 2013  s/p  radical non-nerve sparing prostatectomy 2014--  Stage pT2cNx,  PSA 3.48,  Gleason 3+3,  vol 55cc/  current PSA  0.01 (Aug 2016)  . Thyroid nodule    left side  . Type II  diabetes mellitus ([place])   . Urinary frequency    takes Flomax daily  . Urine incontinence   . Wears glasses    Immunization History  Administered Date(s) Administered  . Influenza Inj Mdck Quad With Preservative 11/22/2016  . Influenza Split 10/17/2013, 10/29/2014  . Influenza, Seasonal, Injecte, Preservative Fre 11/29/2015  . Influenza-Unspecified 11/04/2012  . PPD Test 01/16/2006, 04/07/2013  . Pneumococcal Polysaccharide-23 04/07/2013, 11/22/2016  . Pneumococcal-Unspecified 09/17/2010  . Td 01/17/2011  . Zoster 01/16/2005   Past Surgical History:    Procedure Laterality Date  . ANAL FISSURE REPAIR  06-05-2002  . BACK SURGERY    . CARDIAC CATHETERIZATION    . CARDIOVASCULAR STRESS TEST  01-01-2008   normal nuclear study/  no ischemia/  normal LV function and wall motion , 57%  . ESOPHAGOGASTRODUODENOSCOPY  09/26/2011   Procedure: ESOPHAGOGASTRODUODENOSCOPY (EGD);  Surgeon: Cleotis Nipper, MD;  Location: [facility];  Service: Endoscopy;  Laterality: N/A;  . KNEE ARTHROSCOPY WITH MEDIAL MENISECTOMY Left 12/31/2014   Procedure: LEFT KNEE ARTHROSCOPY, PARTIAL MEDIAL AND LATERAL  MENISECTOMIES WITH [place];  Surgeon: Susa Day, MD;  Location: [place];  Service: Orthopedics;  Laterality: Left;  . LEFT AND RIGHT HEART CATHETERIZATION WITH CORONARY ANGIOGRAM N/A 08/25/2011   Procedure: LEFT AND RIGHT HEART CATHETERIZATION WITH CORONARY ANGIOGRAM;  Surgeon: Peter M Martinique, MD;  Location: [facility] CATH LAB;  Service: Cardiovascular;  Laterality: N/A;   Normal coronary arteries and LVF, ef 55-60%  . POSTERIOR FUSION CERVICAL SPINE  07-05-2010   C1 --  C4  . POSTERIOR LUMBAR FUSION  05-18-2014   L4 -- S1  . ROBOT ASSISTED LAPAROSCOPIC RADICAL PROSTATECTOMY  02/07/2012   Procedure: ROBOTIC ASSISTED LAPAROSCOPIC RADICAL PROSTATECTOMY LEVEL 1;  Surgeon: Molli Hazard, MD;  Location: [facility];  Service: Urology;  Laterality: N/A;     . TOTAL KNEE ARTHROPLASTY Left 08/05/2015   Procedure: LEFT TOTAL KNEE ARTHROPLASTY;  Surgeon: Susa Day, MD;  Location: [facility];  Service: Orthopedics;  Laterality: Left;  . TRANSTHORACIC ECHOCARDIOGRAM  08-03-2011   mild LVH, ef 55-60%/  trivial TR and PR  . UVULOPALATOPHARYNGOPLASTY (UPPP)/TONSILLECTOMY/SEPTOPLASTY  1990's   and Adenoidectomy  . wisdom teeth extracted   1982   FHx:    Reviewed / unchanged  SHx:    Reviewed / unchanged  Systems Review:  Constitutional: Denies fever, chills, wt changes, headaches, insomnia, fatigue, night sweats, change in appetite. Eyes: Denies  redness, blurred vision, diplopia, discharge, itchy, watery eyes.  ENT: Denies discharge, congestion, post nasal drip, epistaxis, sore throat, earache, hearing loss, dental pain, tinnitus, vertigo, sinus pain, snoring.  CV: Denies chest pain, palpitations, irregular heartbeat, syncope, dyspnea, diaphoresis, orthopnea, PND, claudication or edema. Respiratory: denies cough, dyspnea, DOE, pleurisy, hoarseness, laryngitis, wheezing.  Gastrointestinal: Denies dysphagia, odynophagia, heartburn, reflux, water brash, abdominal pain or cramps, nausea, vomiting, bloating, diarrhea, constipation, hematemesis, melena, hematochezia  or hemorrhoids. Genitourinary: Denies dysuria,  discharge, hematuria or flank pain. c/o frequency, urgency, nocturia x 2-3 , hesitancy.  Musculoskeletal: Denies arthralgias, myalgias, stiffness, jt. swelling, pain, limping or strain/sprain.  Skin: Denies pruritus, rash, hives, warts, acne, eczema or change in skin lesion(s). Neuro: No weakness, tremor, incoordination, spasms, paresthesia or pain. Psychiatric: Denies confusion, memory loss or sensory loss. Endo: Denies change in weight, skin or hair change.  Heme/Lymph: No excessive bleeding, bruising or enlarged lymph nodes.  Physical Exam  BP (!) 142/90   Pulse 72   Temp (!) 97.5 F (36.4 C)   Resp 18  Ht 6' 1.5" (1.867 m)   Wt (!) 318 lb 6.4 oz (144.4 kg)   BMI 41.44 kg/m   Appears  well nourished, well groomed  and in no distress.  Eyes: PERRLA, EOMs, conjunctiva no swelling or erythema. Sinuses: No frontal/maxillary tenderness ENT/Mouth: EAC's clear, TM's nl w/o erythema, bulging. Nares clear w/o erythema, swelling, exudates. Oropharynx clear without erythema or exudates. Oral hygiene is good. Tongue normal, non obstructing. Hearing intact.  Neck: Supple. Thyroid not palpable. Car 2+/2+ without bruits, nodes or JVD. Chest: Respirations nl with BS clear & equal w/o rales, rhonchi, wheezing or stridor.  Cor: Heart  sounds normal w/ regular rate and rhythm without sig. murmurs, gallops, clicks or rubs. Peripheral pulses normal and equal  without edema.  Abdomen: Soft & bowel sounds normal. Non-tender w/o guarding, rebound, hernias, masses or organomegaly.  Lymphatics: Unremarkable.  Musculoskeletal: Full ROM all peripheral extremities, joint stability, 5/5 strength and normal gait.  Skin: Warm, dry without exposed rashes, lesions or ecchymosis apparent.  Neuro: Cranial nerves intact, reflexes equal bilaterally. Motor testing grossly intact. Sensation decreased  in a stocking distribution from the knees distally  to touch, vibratory and Monofilament to the toes bilaterally. Tendon reflexes grossly intact.  Pysch: Alert & oriented x 3.  Insight and judgement nl & appropriate. No ideations.  Assessment and Plan:     1. Essential hypertension  - Continue medication, monitor blood pressure at home.  - Continue DASH diet. Reminder to go to the ER if any CP,  SOB, nausea, dizziness, severe HA, changes vision/speech.  - CBC with Differential/Platelet - BASIC METABOLIC PANEL WITH GFR - Magnesium - TSH  2. Hyperlipidemia, mixed  - Continue diet/meds, exercise,& lifestyle modifications.  - Continue monitor periodic cholesterol/liver & renal functions   - Hepatic function panel - Lipid panel - TSH  3. Type 2 diabetes mellitus with stage 2 chronic kidney disease, without long-term current use of insulin (HCC)  - Continue diet, exercise, lifestyle modifications.  - Monitor appropriate labs.  - Hemoglobin A1c - Insulin, random  4. Vitamin D deficiency  - Continue supplementation.  - VITAMIN D 25 Hydroxy   5. Idiopathic gout - Uric acid  6. Benign localized prostatic hyperplasia with lower urinary tract symptoms (LUTS)  7. Gastroesophageal reflux disease  8. DM type 2 with diabetic peripheral neuropathy (HCC)  - Hemoglobin A1c - Insulin, random  9. Medication management  - CBC with  Differential/Platelet - BASIC METABOLIC PANEL WITH GFR - Hepatic function panel - Magnesium - Lipid panel - TSH - Hemoglobin A1c - Insulin, random - VITAMIN D 25 Hydroxyl - Uric acid        Discussed  regular exercise, BP monitoring, weight control to achieve/maintain BMI less than 25 and discussed med and SE's. Recommended labs to assess and monitor clinical status with further disposition pending results of labs. Over 30 minutes of exam, counseling, chart review was performed.- Continue diet, exercise, lifestyle modifications.

## 2017-03-31 ENCOUNTER — Encounter: Payer: Self-pay | Admitting: Internal Medicine

## 2017-04-02 LAB — CBC WITH DIFFERENTIAL/PLATELET
Basophils Absolute: 78 cells/uL (ref 0–200)
Basophils Relative: 1.2 %
Eosinophils Absolute: 163 cells/uL (ref 15–500)
Eosinophils Relative: 2.5 %
HCT: 39.1 % (ref 38.5–50.0)
Hemoglobin: 13.5 g/dL (ref 13.2–17.1)
Lymphs Abs: 1567 cells/uL (ref 850–3900)
MCH: 29.2 pg (ref 27.0–33.0)
MCHC: 34.5 g/dL (ref 32.0–36.0)
MCV: 84.4 fL (ref 80.0–100.0)
MPV: 10 fL (ref 7.5–12.5)
Monocytes Relative: 9.7 %
Neutro Abs: 4063 cells/uL (ref 1500–7800)
Neutrophils Relative %: 62.5 %
Platelets: 304 10*3/uL (ref 140–400)
RBC: 4.63 10*6/uL (ref 4.20–5.80)
RDW: 15.4 % — ABNORMAL HIGH (ref 11.0–15.0)
Total Lymphocyte: 24.1 %
WBC mixed population: 631 cells/uL (ref 200–950)
WBC: 6.5 10*3/uL (ref 3.8–10.8)

## 2017-04-02 LAB — HEPATIC FUNCTION PANEL
AG Ratio: 1.8 (calc) (ref 1.0–2.5)
ALT: 38 U/L (ref 9–46)
AST: 25 U/L (ref 10–35)
Albumin: 4.4 g/dL (ref 3.6–5.1)
Alkaline phosphatase (APISO): 72 U/L (ref 40–115)
Bilirubin, Direct: 0.1 mg/dL (ref 0.0–0.2)
Globulin: 2.4 g/dL (calc) (ref 1.9–3.7)
Indirect Bilirubin: 0.5 mg/dL (calc) (ref 0.2–1.2)
Total Bilirubin: 0.6 mg/dL (ref 0.2–1.2)
Total Protein: 6.8 g/dL (ref 6.1–8.1)

## 2017-04-02 LAB — BASIC METABOLIC PANEL WITH GFR
BUN: 16 mg/dL (ref 7–25)
CO2: 28 mmol/L (ref 20–32)
Calcium: 10.1 mg/dL (ref 8.6–10.3)
Chloride: 103 mmol/L (ref 98–110)
Creat: 1.08 mg/dL (ref 0.70–1.25)
GFR, Est African American: 84 mL/min/{1.73_m2} (ref >=60)
GFR, Est Non African American: 72 mL/min/{1.73_m2} (ref >=60)
Glucose, Bld: 145 mg/dL — ABNORMAL HIGH (ref 65–99)
Potassium: 4.9 mmol/L (ref 3.5–5.3)
Sodium: 139 mmol/L (ref 135–146)

## 2017-04-02 LAB — LIPID PANEL
Cholesterol: 175 mg/dL (ref <200)
HDL: 32 mg/dL — ABNORMAL LOW (ref >40)
LDL Cholesterol (Calc): 108 mg/dL (calc) — ABNORMAL HIGH
Non-HDL Cholesterol (Calc): 143 mg/dL (calc) — ABNORMAL HIGH (ref <130)
Total CHOL/HDL Ratio: 5.5 (calc) — ABNORMAL HIGH (ref <5.0)
Triglycerides: 248 mg/dL — ABNORMAL HIGH (ref <150)

## 2017-04-02 LAB — TSH: TSH: 3.16 mIU/L (ref 0.40–4.50)

## 2017-04-02 LAB — MAGNESIUM: Magnesium: 1.8 mg/dL (ref 1.5–2.5)

## 2017-04-02 LAB — HEMOGLOBIN A1C
Hgb A1c MFr Bld: 7.5 % of total Hgb — ABNORMAL HIGH (ref <5.7)
Mean Plasma Glucose: 169 (calc)
eAG (mmol/L): 9.3 (calc)

## 2017-04-02 LAB — URIC ACID: Uric Acid, Serum: 6.8 mg/dL (ref 4.0–8.0)

## 2017-04-02 LAB — INSULIN, RANDOM: Insulin: 14 u[IU]/mL (ref 2.0–19.6)

## 2017-04-02 LAB — VITAMIN D 25 HYDROXY (VIT D DEFICIENCY, FRACTURES): Vit D, 25-Hydroxy: 85 ng/mL (ref 30–100)

## 2017-04-21 ENCOUNTER — Other Ambulatory Visit: Payer: Self-pay | Admitting: Internal Medicine

## 2017-04-24 ENCOUNTER — Ambulatory Visit: Payer: Medicare Other | Admitting: Podiatry

## 2017-04-24 DIAGNOSIS — Q828 Other specified congenital malformations of skin: Secondary | ICD-10-CM | POA: Diagnosis not present

## 2017-04-24 DIAGNOSIS — E1142 Type 2 diabetes mellitus with diabetic polyneuropathy: Secondary | ICD-10-CM | POA: Diagnosis not present

## 2017-04-24 DIAGNOSIS — M79676 Pain in unspecified toe(s): Secondary | ICD-10-CM | POA: Diagnosis not present

## 2017-04-24 DIAGNOSIS — B351 Tinea unguium: Secondary | ICD-10-CM | POA: Diagnosis not present

## 2017-04-24 NOTE — Progress Notes (Signed)
Presents today chief complaint of painful elongated toenails and multiple calluses bilateral forefoot.  Denies any open lesions or wounds.  Denies any problems with his feet sitting here feeling much better than they have in the past.  Objective: Signs are stable he is alert and oriented x3.  Pulses are palpable.  Neurologic sensorium is intact.  Toenails are long thick yellow dystrophic-like mycotic painful palpation.  Reactive hyperkeratosis to the distal aspect of the toes and plantar forefoot with tyloma is noted bilateral.  No open lesions or wounds are noted.  Assessment: Diabetic peripheral neuropathy hammertoe deformities reactive hyperkeratosis and painful elongated mycotic nails.  Plan: Debrided all reactive hyperkeratotic tissue and also debrided toenails bilateral.

## 2017-05-05 ENCOUNTER — Other Ambulatory Visit: Payer: Self-pay | Admitting: Internal Medicine

## 2017-05-07 DIAGNOSIS — R3912 Poor urinary stream: Secondary | ICD-10-CM | POA: Diagnosis not present

## 2017-05-24 ENCOUNTER — Other Ambulatory Visit: Payer: Self-pay | Admitting: Physician Assistant

## 2017-05-24 ENCOUNTER — Other Ambulatory Visit: Payer: Self-pay | Admitting: Internal Medicine

## 2017-05-28 ENCOUNTER — Ambulatory Visit (INDEPENDENT_AMBULATORY_CARE_PROVIDER_SITE_OTHER): Payer: Medicare Other | Admitting: Adult Health

## 2017-05-28 ENCOUNTER — Ambulatory Visit (HOSPITAL_COMMUNITY)
Admission: RE | Admit: 2017-05-28 | Discharge: 2017-05-28 | Disposition: A | Discharge status: Home / Self Care | Payer: Medicare Other | Source: Ambulatory Visit | Attending: Adult Health | Admitting: Adult Health

## 2017-05-28 ENCOUNTER — Encounter: Payer: Self-pay | Admitting: Adult Health

## 2017-05-28 VITALS — BP 134/86 | HR 97 | Temp 97.3°F | Ht 73.5 in | Wt 310.0 lb

## 2017-05-28 DIAGNOSIS — M19072 Primary osteoarthritis, left ankle and foot: Secondary | ICD-10-CM | POA: Diagnosis not present

## 2017-05-28 DIAGNOSIS — M7752 Other enthesopathy of left foot: Secondary | ICD-10-CM | POA: Diagnosis not present

## 2017-05-28 DIAGNOSIS — M25472 Effusion, left ankle: Secondary | ICD-10-CM | POA: Insufficient documentation

## 2017-05-28 MED ORDER — DOXYCYCLINE HYCLATE 100 MG PO TABS: 100.0000 mg | ORAL_TABLET | Freq: Two times a day (BID) | ORAL | 0 refills | Status: AC

## 2017-05-28 MED ORDER — PREDNISONE 20 MG PO TABS: ORAL_TABLET | ORAL | 0 refills | Status: DC

## 2017-05-28 NOTE — Patient Instructions (Signed)
Bursitis Bursitis is inflammation and irritation of a bursa, which is one of the small, fluid-filled sacs that cushion and protect the moving parts of your body. These sacs are located between bones and muscles, muscle attachments, or skin areas next to bones. A bursa protects these structures from the wear and tear that results from frequent movement. An inflamed bursa causes pain and swelling. Fluid may build up inside the sac. Bursitis is most common near joints, especially the knees, elbows, hips, and shoulders. What are the causes? Bursitis can be caused by:  Injury from: ? A direct blow, like falling on your knee or elbow. ? Overuse of a joint (repetitive stress).  Infection. This can happen if bacteria gets into a bursa through a cut or scrape near a joint.  Diseases that cause joint inflammation, such as gout and rheumatoid arthritis.  What increases the risk? You may be at risk for bursitis if you:  Have a job or hobby that involves a lot of repetitive stress on your joints.  Have a condition that weakens your body's defense system (immune system), such as diabetes, cancer, or HIV.  Lift and reach overhead often.  Kneel or lean on hard surfaces often.  Run or walk often.  What are the signs or symptoms? The most common signs and symptoms of bursitis are:  Pain that gets worse when you move the affected body part or put weight on it.  Inflammation.  Stiffness.  Other signs and symptoms may include:  Redness.  Tenderness.  Warmth.  Pain that continues after rest.  Fever and chills. This may occur in bursitis caused by infection.  How is this diagnosed? Bursitis may be diagnosed by:  Medical history and physical exam.  MRI.  A procedure to drain fluid from the bursa with a needle (aspiration). The fluid may be checked for signs of infection or gout.  Blood tests to rule out other causes of inflammation.  How is this treated? Bursitis can usually be  treated at home with rest, ice, compression, and elevation (RICE). For mild bursitis, RICE treatment may be all you need. Other treatments may include:  Nonsteroidal anti-inflammatory drugs (NSAIDs) to treat pain and inflammation.  Corticosteroids to fight inflammation. You may have these drugs injected into and around the area of bursitis.  Aspiration of bursitis fluid to relieve pain and improve movement.  Antibiotic medicine to treat an infected bursa.  A splint, brace, or walking aid.  Physical therapy if you continue to have pain or limited movement.  Surgery to remove a damaged or infected bursa. This may be needed if you have a very bad case of bursitis or if other treatments have not worked.  Follow these instructions at home:  Take medicines only as directed by your health care provider.  If you were prescribed an antibiotic medicine, finish it all even if you start to feel better.  Rest the affected area as directed by your health care provider. ? Keep the area elevated. ? Avoid activities that make pain worse.  Apply ice to the injured area: ? Place ice in a plastic bag. ? Place a towel between your skin and the bag. ? Leave the ice on for 20 minutes, 2-3 times a day.  Use splints, braces, pads, or walking aids as directed by your health care provider.  Keep all follow-up visits as directed by your health care provider. This is important. How is this prevented?  Wear knee pads if you kneel often.    Wear sturdy running or walking shoes that fit you well.  Take regular breaks from repetitive activity.  Warm up by stretching before doing any strenuous activity.  Maintain a healthy weight or lose weight as recommended by your health care provider. Ask your health care provider if you need help.  Exercise regularly. Start any new physical activity gradually. Contact a health care provider if:  Your bursitis is not responding to treatment or home care.  You have  a fever.  You have chills. This information is not intended to replace advice given to you by your health care provider. Make sure you discuss any questions you have with your health care provider. Document Released: 12/31/1999 Document Revised: 06/10/2015 Document Reviewed: 03/24/2013 [employer] Interactive Patient Education  2018 [employer]  

## 2017-05-28 NOTE — Progress Notes (Signed)
Assessment and Plan:  Jason Wyatt was seen today for ankle pain.  Diagnoses and all orders for this visit:  Bursitis of posterior heel, left Will treat with abx to cover possible superimposed infection due to high risk Labs/imaging to rule out alternate etiology as presentation is somewhat atypical Will have him follow up with podiatry sooner or refer to ortho for injections, etc if not improving adequately -     doxycycline (VIBRA-TABS) 100 MG tablet; Take 1 tablet (100 mg total) by mouth 2 (two) times daily for 10 days. -     predniSONE (DELTASONE) 20 MG tablet; 2 tablets daily for 3 days, 1 tablet daily for 4 days.  -     CBC with Differential/Platelet -     BASIC METABOLIC PANEL WITH GFR -     Uric acid -     DG Foot Complete Left; Future  Further disposition pending results of labs. Discussed med's effects and SE's.   Over 15 minutes of exam, counseling, chart review, and critical decision making was performed.   Future Appointments  Date Time Provider [place]  07/16/2017 10:30 AM Liane Comber, NP [facility] None  07/31/2017  9:00 AM Floyd, [name], DPM [facility]  12/04/2017  9:00 AM Vicie Mutters, PA-C [facility] None    ------------------------------------------------------------------------------------------------------------------   HPI BP 134/86   Pulse 97   Temp (!) 97.3 F (36.3 C)   Ht 6' 1.5" (1.867 m)   Wt (!) 310 lb (140.6 kg)   SpO2 95%   BMI 40.35 kg/m   62 y.o.male with dx of gout (last flare remote) and T2DM (A1C 7.5 on 3/15) with peripheral neuropathy presents for evaluation of distinctly localized swelling of his left heel; he reports he thought he twisted his foot/ankle last Wednesday (5 days ago) and had some discomfort, but noted redness and swelling 2 days later. He denies fever/chills, known injury, recent known bug bites. He has tried applying ice and doing epsom salt soaks without notable improvement.   He is followed closely  by podiatry Dr. [name] q3 months  Past Medical History:  Diagnosis Date  . Acute meniscal tear of left knee   . Allergic rhinitis   . Arthritis     back  . Borderline glaucoma   . Chronic back pain    stenosis  . CKD (chronic kidney disease), stage II   . Complication of anesthesia cervical fusion surgery 06/2010   "woke up next day w/ventilator on" told due to OSA  . GERD (gastroesophageal reflux disease)    takes Prevacid daily  . Gout    takes Allopurinol daily and Colchicine if needed  . History of adenomatous polyp of colon    tubular adenoma's  . History of Barrett's esophagus   . History of hiatal hernia   . Hyperlipidemia   . Hypertension   . Incomplete right bundle branch block (RBBB)   . OA (osteoarthritis)    knee  . OSA on CPAP   . Peripheral neuropathy   . Prostate cancer [facility]) urologist-  dr Alinda Money   dx 2013  s/p  radical non-nerve sparing prostatectomy 2014--  Stage pT2cNx,  PSA 3.48,  Gleason 3+3,  vol 55cc/  current PSA  0.01 (Aug 2016)  . Thyroid nodule    left side  . Type II diabetes mellitus ([place])   . Urinary frequency    takes Flomax daily  . Urine incontinence   . Wears glasses      Allergies  Allergen  Reactions  . Flaxseed [Linseed Oil] Rash  . Lyrica [Pregabalin] Itching and Rash  . Niaspan [Niacin Er] Rash    Current Outpatient Medications on File Prior to Visit  Medication Sig  . ACCU-CHEK SOFTCLIX LANCETS lancets CHECK BLOOD SUGAR 1 TIME  DAILY  . acyclovir (ZOVIRAX) 400 MG tablet TAKE 1 TABLET BY MOUTH  DAILY AS NEEDED FOR COLD  SORES  . allopurinol (ZYLOPRIM) 300 MG tablet TAKE 1 TABLET BY MOUTH AT  BEDTIME  . aspirin EC 81 MG tablet Take 81 mg by mouth daily.  . blood glucose meter kit and supplies KIT by Does not apply route daily as needed. Dispense based on patient and insurance preference. Use up to four times daily as directed. (FOR ICD-9 250.00, 250.01).  . carbidopa-levodopa (SINEMET IR) 25-250 MG tablet TAKE 1 TABLET BY MOUTH  TWO  TIMES DAILY FOR RESTLESS  LEGS  . clobetasol cream (TEMOVATE) 3.61 % Apply 1 application topically 2 (two) times daily.  . DULoxetine (CYMBALTA) 60 MG capsule TAKE 1 CAPSULE BY MOUTH AT  BEDTIME  . enalapril (VASOTEC) 20 MG tablet TAKE 1 TABLET BY MOUTH  TWICE A DAY  . fexofenadine (ALLEGRA) 180 MG tablet Take 180 mg by mouth daily. AS NEEDED BASIS FOR SINUS  . gabapentin (NEURONTIN) 300 MG capsule TAKE TWO CAPSULES BY MOUTH  THREE TIMES DAILY.  [name] glucose blood (ONE TOUCH ULTRA TEST) test strip CHECK BLOOD SUGAR 1 TIME  DAILY  . glucose blood test strip 1 each by Other route as needed for other. Use as instructed  . guaiFENesin (MUCINEX) 600 MG 12 hr tablet Take 600 mg by mouth daily as needed.  [name] JANUVIA 100 MG tablet TAKE 1 TABLET BY MOUTH  DAILY  . Magnesium 500 MG CAPS Take 500 mg by mouth every evening.   . meloxicam (MOBIC) 15 MG tablet   . metFORMIN (GLUCOPHAGE) 1000 MG tablet TAKE 1 TABLET BY MOUTH  TWICE A DAY WITH MEALS  . methocarbamol (ROBAXIN) 500 MG tablet Take 1 tablet (500 mg total) by mouth 3 (three) times daily. (Patient taking differently: Take 500 mg by mouth as needed. )  . omega-3 acid ethyl esters (LOVAZA) 1 g capsule TAKE 2 BY MOUTH TWICE DAILY (Patient taking differently: Take 2 g by mouth 2 (two) times daily. )  . pantoprazole (PROTONIX) 40 MG tablet TAKE 1 TABLET BY MOUTH  DAILY  . tamsulosin (FLOMAX) 0.4 MG CAPS capsule Take 1 capsule (0.4 mg total) daily after supper by mouth.  . Vitamin D, Ergocalciferol, (DRISDOL) 50000 units CAPS capsule Take 1 capsule (50,000 Units total) by mouth as directed. Tuesday , Wednesday, Thursday, Sunday (Patient taking differently: Take 50,000 Units by mouth 2 (two) times a week. Tuesday and Sunday)  . colchicine 0.6 MG tablet Take 1 tablet (0.6 mg total) by mouth 2 (two) times daily as needed (gout flare ups). (Patient not taking: Reported on 05/28/2017)   No current facility-administered medications on file prior to visit.      ROS: all negative except above.   Physical Exam:  BP 134/86   Pulse 97   Temp (!) 97.3 F (36.3 C)   Ht 6' 1.5" (1.867 m)   Wt (!) 310 lb (140.6 kg)   SpO2 95%   BMI 40.35 kg/m   General Appearance: Well nourished, in no apparent distress. Eyes: PERRLA, EOMs, conjunctiva no swelling or erythema Sinuses: No Frontal/maxillary tenderness ENT/Mouth: No erythema, swelling, or exudate on post pharynx.  Tonsils not swollen or  erythematous. Hearing normal.  Neck: Supple.  Respiratory: Respiratory effort normal, BS equal bilaterally without rales, rhonchi, wheezing or stridor.  Cardio: RRR with no MRGs. Brisk peripheral pulses without edema.  Abdomen: Soft, + BS.  Non tender, no guarding, rebound, hernias, masses. Lymphatics: Non tender without lymphadenopathy.  Musculoskeletal: Full ROM, Symmetrical strength, slow gait with cane. Left heel with distinct very firm enlargement at insertion of achelles tendon, injected but not notably hot to touch, not notably tender (however patient has advanced neuropathy of bilateral feet).  Skin: Warm, dry without rashes, lesions, ecchymosis.   Neuro: Cranial nerves intact. Normal muscle tone, no cerebellar symptoms. Sensation intact.  Psych: Awake and oriented X 3, normal affect, Insight and Judgment appropriate.     Izora Ribas, NP 3:41 PM [facility]

## 2017-05-29 DIAGNOSIS — G4733 Obstructive sleep apnea (adult) (pediatric): Secondary | ICD-10-CM | POA: Diagnosis not present

## 2017-05-29 LAB — CBC WITH DIFFERENTIAL/PLATELET
Basophils Absolute: 80 cells/uL (ref 0–200)
Basophils Relative: 1.1 %
Eosinophils Absolute: 277 cells/uL (ref 15–500)
Eosinophils Relative: 3.8 %
HCT: 36.7 % — ABNORMAL LOW (ref 38.5–50.0)
Hemoglobin: 12.6 g/dL — ABNORMAL LOW (ref 13.2–17.1)
Lymphs Abs: 1497 cells/uL (ref 850–3900)
MCH: 29.5 pg (ref 27.0–33.0)
MCHC: 34.3 g/dL (ref 32.0–36.0)
MCV: 85.9 fL (ref 80.0–100.0)
MPV: 10 fL (ref 7.5–12.5)
Monocytes Relative: 8.3 %
Neutro Abs: 4840 cells/uL (ref 1500–7800)
Neutrophils Relative %: 66.3 %
Platelets: 308 10*3/uL (ref 140–400)
RBC: 4.27 10*6/uL (ref 4.20–5.80)
RDW: 14.8 % (ref 11.0–15.0)
Total Lymphocyte: 20.5 %
WBC mixed population: 606 cells/uL (ref 200–950)
WBC: 7.3 10*3/uL (ref 3.8–10.8)

## 2017-05-29 LAB — BASIC METABOLIC PANEL WITH GFR
BUN: 16 mg/dL (ref 7–25)
CO2: 25 mmol/L (ref 20–32)
Calcium: 9.5 mg/dL (ref 8.6–10.3)
Chloride: 103 mmol/L (ref 98–110)
Creat: 0.9 mg/dL (ref 0.70–1.25)
GFR, Est African American: 104 mL/min/{1.73_m2} (ref >=60)
GFR, Est Non African American: 90 mL/min/{1.73_m2} (ref >=60)
Glucose, Bld: 184 mg/dL — ABNORMAL HIGH (ref 65–99)
Potassium: 4.3 mmol/L (ref 3.5–5.3)
Sodium: 136 mmol/L (ref 135–146)

## 2017-05-29 LAB — URIC ACID: Uric Acid, Serum: 6.5 mg/dL (ref 4.0–8.0)

## 2017-05-30 ENCOUNTER — Ambulatory Visit: Payer: Medicare Other | Admitting: Podiatry

## 2017-05-30 ENCOUNTER — Ambulatory Visit: Payer: Medicare Other

## 2017-05-30 DIAGNOSIS — L03116 Cellulitis of left lower limb: Secondary | ICD-10-CM

## 2017-05-30 DIAGNOSIS — E1142 Type 2 diabetes mellitus with diabetic polyneuropathy: Secondary | ICD-10-CM

## 2017-06-03 NOTE — Progress Notes (Signed)
   Subjective:  62 year old male presenting today with a chief complaint of redness and swelling of the left foot that began one week ago. He states he thought he twisted his ankle and states the symptoms have been progressively worsening. He is on his third day of taking Doxycycline and second day of taking Prednisone. Patient is here for further evaluation and treatment.   Past Medical History:  Diagnosis Date  . Acute meniscal tear of left knee   . Allergic rhinitis   . Arthritis     back  . Borderline glaucoma   . Chronic back pain    stenosis  . CKD (chronic kidney disease), stage II   . Complication of anesthesia cervical fusion surgery 06/2010   "woke up next day w/ventilator on" told due to OSA  . GERD (gastroesophageal reflux disease)    takes Prevacid daily  . Gout    takes Allopurinol daily and Colchicine if needed  . History of adenomatous polyp of colon    tubular adenoma's  . History of Barrett's esophagus   . History of hiatal hernia   . Hyperlipidemia   . Hypertension   . Incomplete right bundle branch block (RBBB)   . OA (osteoarthritis)    knee  . OSA on CPAP   . Peripheral neuropathy   . Prostate cancer [facility]) urologist-  dr Alinda Money   dx 2013  s/p  radical non-nerve sparing prostatectomy 2014--  Stage pT2cNx,  PSA 3.48,  Gleason 3+3,  vol 55cc/  current PSA  0.01 (Aug 2016)  . Thyroid nodule    left side  . Type II diabetes mellitus ([place])   . Urinary frequency    takes Flomax daily  . Urine incontinence   . Wears glasses      Objective / Physical Exam:  General:  The patient is alert and oriented x3 in no acute distress. Dermatology:  Skin is warm, dry and supple bilateral lower extremities. Negative for open lesions or macerations. Vascular:  Palpable pedal pulses bilaterally. Capillary refill within normal limits. Neurological:  Epicritic and protective threshold grossly intact bilaterally.  Musculoskeletal Exam:  Pain on palpation to the  anterior lateral medial aspects of the patient's left ankle. Erythema and edema localized to the medial and posterior aspects of the left ankle. Range of motion within normal limits to all pedal and ankle joints bilateral. Muscle strength 5/5 in all groups bilateral.   Assessment: 1. Synovitis vs cellulitis left ankle   Plan of Care:  1. Patient was evaluated. 2. Continue taking Medrol and Doxycyline as directed by PCP.  3. CAM boot dispensed. Weightbearing as tolerated.  4. Return to clinic in 3 weeks with Dr. [name].   Edrick Kins, DPM [facility]  Dr. Edrick Kins, [address]                                        [place], [place] [address]                Office [phone]  Fax [phone]

## 2017-06-08 ENCOUNTER — Telehealth: Payer: Self-pay | Admitting: Podiatry

## 2017-06-08 MED ORDER — DOXYCYCLINE HYCLATE 100 MG PO CAPS: 100.0000 mg | ORAL_CAPSULE | Freq: Two times a day (BID) | ORAL | 0 refills | Status: DC

## 2017-06-08 NOTE — Addendum Note (Signed)
Addended by: Harriett Sine D on: 06/08/2017 01:54 PM   Modules accepted: Orders

## 2017-06-08 NOTE — Telephone Encounter (Addendum)
Dr. Amalia Hailey states order the Doxycycline 100mg  #20 bid and make an appt. Left message with Dr. Amalia Hailey orders.

## 2017-06-08 NOTE — Telephone Encounter (Signed)
I saw Dr. Amalia Hailey last week. I have cellulitis of my left ankle. It is still swollen with some redness and still hurts. I was wondering if I needed to get a refill of the prednisone or the antibiotic? Both of them have been completed and finished or do I need to let it heal on its own. Please call me back at [phone]. Thank you and have a blessed day.

## 2017-06-12 ENCOUNTER — Encounter: Payer: Self-pay | Admitting: Podiatry

## 2017-06-12 ENCOUNTER — Telehealth: Payer: Self-pay | Admitting: Podiatry

## 2017-06-12 ENCOUNTER — Ambulatory Visit: Payer: Medicare Other | Admitting: Podiatry

## 2017-06-12 DIAGNOSIS — S86012D Strain of left Achilles tendon, subsequent encounter: Secondary | ICD-10-CM | POA: Diagnosis not present

## 2017-06-12 NOTE — Telephone Encounter (Signed)
I spoke with pt and he states he still has redness, swelling but not as bad but is worrying him. I told pt that he had been on the antibiotic long enough to see more change, so I felt he should come in sooner then 06/18/2017. Transferred to scheduler.

## 2017-06-12 NOTE — Telephone Encounter (Signed)
I was calling to see if the nurse wanted me to change my appointment from Monday, 03 June to something sooner? I did get the refill and its not as swollen as it was but it is still swollen with some redness. If I need to change my appointment please call me back at [phone]. Thank you.

## 2017-06-12 NOTE — Progress Notes (Signed)
He presents today for a follow-up of his swollen red ankle left.  He was seen by Dr. Amalia Hailey last week or so he was placed in a Cam walker and was continued antibiotics.  He states that really has not gotten any better other than the redness is gone down.  Objective: Vital signs are stable he is alert and oriented x3.  Pulses are palpable.  He has an area of erythema to the posterior aspect of the ankle with swelling.  Upon palpation there is easily determinable that he had a void in the Achilles.  There appears to be a small fragment of the Achilles that is still left however the Achilles proper appears to be ruptured.  Assessment: Ruptured Achilles tendinitis.  Plan: At this point I highly recommend an MRI of the ankle to evaluate for surgical consideration.  Continue use of the cam walker and continue antibiotics until completed.

## 2017-06-13 ENCOUNTER — Telehealth: Payer: Self-pay | Admitting: *Deleted

## 2017-06-13 DIAGNOSIS — S86012D Strain of left Achilles tendon, subsequent encounter: Secondary | ICD-10-CM

## 2017-06-13 NOTE — Telephone Encounter (Signed)
Orders to [facility] [employer] and to [facility] .

## 2017-06-13 NOTE — Telephone Encounter (Signed)
-----   Message from Rip Harbour, [facility] sent at 06/12/2017  3:29 PM EDT ----- Regarding: MRI  MRI ankle left - evaluate torn achilles tendon left - surgical consideration

## 2017-06-18 ENCOUNTER — Ambulatory Visit: Payer: Medicare Other | Admitting: Podiatry

## 2017-06-19 ENCOUNTER — Ambulatory Visit
Admission: RE | Admit: 2017-06-19 | Discharge: 2017-06-19 | Disposition: A | Discharge status: Home / Self Care | Payer: Medicare Other | Source: Ambulatory Visit | Attending: Podiatry | Admitting: Podiatry

## 2017-06-19 ENCOUNTER — Telehealth: Payer: Self-pay | Admitting: *Deleted

## 2017-06-19 DIAGNOSIS — S86012A Strain of left Achilles tendon, initial encounter: Secondary | ICD-10-CM | POA: Diagnosis not present

## 2017-06-19 DIAGNOSIS — S86012D Strain of left Achilles tendon, subsequent encounter: Secondary | ICD-10-CM

## 2017-06-19 DIAGNOSIS — R600 Localized edema: Secondary | ICD-10-CM | POA: Diagnosis not present

## 2017-06-19 NOTE — Telephone Encounter (Signed)
Thanks I'll be happy to see him.

## 2017-06-19 NOTE — Telephone Encounter (Signed)
I informed pt of Dr. Stephenie Acres review of results and recommendation of surgery with Dr. March Rummage. Pt asked how long he would be off the foot and I told him at least 4-6 weeks and either in a boot or cast. I told pt we could get him a knee scooter, which he declined or a wheelchair. I told pt this would be discussed with Dr. March Rummage. Transferred pt to schedulers.

## 2017-06-19 NOTE — Telephone Encounter (Signed)
-----   Message from Garrel Ridgel, [place] sent at 06/19/2017 12:04 PM EDT ----- Have him in to see Dr. March Rummage for a surgical consult.   Legrand Como think its best that he be done in the hospital. PCP dx as cellulitis NOT.. Is this something you are interested in doing?   If not please let me know.

## 2017-06-21 ENCOUNTER — Ambulatory Visit: Payer: Medicare Other | Admitting: Podiatry

## 2017-06-21 ENCOUNTER — Other Ambulatory Visit: Payer: Self-pay | Admitting: Podiatry

## 2017-06-21 DIAGNOSIS — E1142 Type 2 diabetes mellitus with diabetic polyneuropathy: Secondary | ICD-10-CM | POA: Diagnosis not present

## 2017-06-21 DIAGNOSIS — S86012D Strain of left Achilles tendon, subsequent encounter: Secondary | ICD-10-CM

## 2017-06-21 NOTE — H&P (View-Only) (Signed)
  Subjective:  Patient ID: Jason Wyatt, male    DOB: [birth year],  MRN: [id number]  Chief Complaint  Patient presents with  . Foot Pain    surgery consult   61 y.o. male returns for the above complaint.  Here for surgical consult of left Achilles tendon rupture.  Referred by Dr. [name]  Objective:  There were no vitals filed for this visit. General AA&O x3. Normal mood and affect.  Vascular Pedal pulses palpable.  Neurologic Epicritic sensation grossly intact.  Dermatologic No open lesions. Skin normal texture and turgor.  Orthopedic:  Pain palpation of the left posterior Achilles tendon with palpable dell proximal to the Achilles tendon insertion.  Negative Thompson test.  Slight active plantar flexion remains   Assessment & Plan:  Patient was evaluated and treated and all questions answered.  Achilles rupture left foot -MRI reviewed with patient.  Discussed possible surgical correction.  Advised that he benefit from left Achilles tendon repair.  Due to a rather large gapping discussed the possible need for tendon lengthening, tendon turndown, possible flexor tendon transfer.  All response alternatives explained to patient no guarantees given.  Advised the risk of rerupture.  Advised of increased risk of wound healing due to diabetes.  Ordered new A1c.  Advised that should he put pressure on the foot he is at risk of causing rerupture.  Discussed importance of nonweightbearing post procedure.  Recommend to patient that he go to a facility for rehab after the procedure.  At the border patient also would benefit from home health care  20 minutes of face to face time were spent with the patient. >50% of this was spent on counseling and coordination of care. Specifically discussed with patient the above diagnoses and overall treatment plan.   Return for post op.

## 2017-06-21 NOTE — Patient Instructions (Signed)
Pre-Operative Instructions  Congratulations, you have decided to take an important step towards improving your quality of life.  You can be assured that the doctors and staff at [facility] will be with you every step of the way.  Here are some important things you should know:  1. Plan to be at the surgery center/hospital at least 1 (one) hour prior to your scheduled time, unless otherwise directed by the surgical center/hospital staff.  You must have a responsible adult accompany you, remain during the surgery and drive you home.  Make sure you have directions to the surgical center/hospital to ensure you arrive on time. 2. If you are having surgery at [facility] or [facility] hospitals, you will need a copy of your medical history and physical form from your family physician within one month prior to the date of surgery. We will give you a form for your primary physician to complete.  3. We make every effort to accommodate the date you request for surgery.  However, there are times where surgery dates or times have to be moved.  We will contact you as soon as possible if a change in schedule is required.   4. No aspirin/ibuprofen for one week before surgery.  If you are on aspirin, any non-steroidal anti-inflammatory medications (Mobic, Aleve, Ibuprofen) should not be taken seven (7) days prior to your surgery.  You make take Tylenol for pain prior to surgery.  5. Medications - If you are taking daily heart and blood pressure medications, seizure, reflux, allergy, asthma, anxiety, pain or diabetes medications, make sure you notify the surgery center/hospital before the day of surgery so they can tell you which medications you should take or avoid the day of surgery. 6. No food or drink after midnight the night before surgery unless directed otherwise by surgical center/hospital staff. 7. No alcoholic beverages 24-hours prior to surgery.  No smoking 24-hours prior or 24-hours after  surgery. 8. Wear loose pants or shorts. They should be loose enough to fit over bandages, boots, and casts. 9. Don't wear slip-on shoes. Sneakers are preferred. 10. Bring your boot with you to the surgery center/hospital.  Also bring crutches or a walker if your physician has prescribed it for you.  If you do not have this equipment, it will be provided for you after surgery. 11. If you have not been contacted by the surgery center/hospital by the day before your surgery, call to confirm the date and time of your surgery. 12. Leave-time from work may vary depending on the type of surgery you have.  Appropriate arrangements should be made prior to surgery with your employer. 13. Prescriptions will be provided immediately following surgery by your doctor.  Fill these as soon as possible after surgery and take the medication as directed. Pain medications will not be refilled on weekends and must be approved by the doctor. 14. Remove nail polish on the operative foot and avoid getting pedicures prior to surgery. 15. Wash the night before surgery.  The night before surgery wash the foot and leg well with water and the antibacterial soap provided. Be sure to pay special attention to beneath the toenails and in between the toes.  Wash for at least three (3) minutes. Rinse thoroughly with water and dry well with a towel.  Perform this wash unless told not to do so by your physician.  Enclosed: 1 Ice pack (please put in freezer the night before surgery)   1 Hibiclens skin cleaner     Pre-op instructions  If you have any questions regarding the instructions, please do not hesitate to call our office.  [place]: [address], [place], [place] [address] -- [phone]  [place]: [address] [place], [place] [address] -- [phone]  [place]: [address]  [place], [place] [address] -- [phone]  [place]: [address], [address], [place], [place] [address] -- [phone]  Website:  [email or web address] 

## 2017-06-21 NOTE — Progress Notes (Signed)
  Subjective:  Patient ID: Jason Wyatt, male    DOB: Aug 21, 1955,  MRN: [id number]  Chief Complaint  Patient presents with  . Foot Pain    surgery consult   62 y.o. male returns for the above complaint.  Here for surgical consult of left Achilles tendon rupture.  Referred by Dr. [name]  Objective:  There were no vitals filed for this visit. General AA&O x3. Normal mood and affect.  Vascular Pedal pulses palpable.  Neurologic Epicritic sensation grossly intact.  Dermatologic No open lesions. Skin normal texture and turgor.  Orthopedic:  Pain palpation of the left posterior Achilles tendon with palpable dell proximal to the Achilles tendon insertion.  Negative Thompson test.  Slight active plantar flexion remains   Assessment & Plan:  Patient was evaluated and treated and all questions answered.  Achilles rupture left foot -MRI reviewed with patient.  Discussed possible surgical correction.  Advised that he benefit from left Achilles tendon repair.  Due to a rather large gapping discussed the possible need for tendon lengthening, tendon turndown, possible flexor tendon transfer.  All response alternatives explained to patient no guarantees given.  Advised the risk of rerupture.  Advised of increased risk of wound healing due to diabetes.  Ordered new A1c.  Advised that should he put pressure on the foot he is at risk of causing rerupture.  Discussed importance of nonweightbearing post procedure.  Recommend to patient that he go to a facility for rehab after the procedure.  At the border patient also would benefit from home health care  20 minutes of face to face time were spent with the patient. >50% of this was spent on counseling and coordination of care. Specifically discussed with patient the above diagnoses and overall treatment plan.   Return for post op.

## 2017-06-22 ENCOUNTER — Encounter: Payer: Self-pay | Admitting: Adult Health

## 2017-06-22 NOTE — Telephone Encounter (Signed)
I left a message for Quincy Carnes to give me a call back.    I called [facility] and spoke to Fleming.  She asked me to fax her Mr. Deaton demographics and insurance to her.  I informed her that his surgery is this Wednesday at [facility] and that he will have an overnight stay at [facility].  She said they will check his insurance and give me a call back and let me know if we can proceed.

## 2017-06-25 ENCOUNTER — Other Ambulatory Visit: Payer: Self-pay | Admitting: Internal Medicine

## 2017-06-25 ENCOUNTER — Telehealth: Payer: Self-pay | Admitting: *Deleted

## 2017-06-25 NOTE — Telephone Encounter (Signed)
I left a message for Jason Wyatt to give me a call back.  I'm trying to find a placement for the patient for post-operative care.    I am calling to let you know that tried calling [facility] regarding your rehabilitation.  I left a message for Jason Wyatt to give me a call back tomorrow.  "I appreciate honey.  I called there today and they said it would be fine.  I thought everything was okay."  I had talked to a lady at [facility] on Friday.  She said she was going to check your insurance and give me a call back.  I haven't heard from her yet.  I will follow up tomorrow.  "Jason Wyatt is who I spoke to at [facility]."  I will call her now and see if she's still there.  "She's probably already left for the day.  I don't know she's going to be on vacation starting Wednesday."  I'll try and reach her now.  I attempted to call Jason Wyatt at [facility].  I left her a message to call be back tomorrow.    "This is Jason Wyatt, I'm calling you back from [facility].  I am calling to let you know our facility has a weight limit of 300 lbs.  Jason Wyatt weighs 310 lbs.  We will not be able to take him."  Okay, thank you so much.  "You're welcome, sorry we could not be of help.  I tried to contact you with this information as soon as I found out."

## 2017-06-25 NOTE — Telephone Encounter (Signed)
"  I was just calling to let you know I have a nursing home, [facility] that will take me on Wednesday.  If you need any help give me a call when you get a chance."

## 2017-06-26 ENCOUNTER — Telehealth: Payer: Self-pay | Admitting: *Deleted

## 2017-06-26 ENCOUNTER — Other Ambulatory Visit: Payer: Self-pay

## 2017-06-26 ENCOUNTER — Encounter (HOSPITAL_COMMUNITY)
Admission: RE | Admit: 2017-06-26 | Discharge: 2017-06-26 | Disposition: A | Discharge status: Home / Self Care | Payer: Medicare Other | Source: Ambulatory Visit | Attending: Podiatry | Admitting: Podiatry

## 2017-06-26 ENCOUNTER — Encounter (HOSPITAL_COMMUNITY): Payer: Self-pay

## 2017-06-26 DIAGNOSIS — Z7982 Long term (current) use of aspirin: Secondary | ICD-10-CM | POA: Diagnosis not present

## 2017-06-26 DIAGNOSIS — N400 Enlarged prostate without lower urinary tract symptoms: Secondary | ICD-10-CM | POA: Diagnosis not present

## 2017-06-26 DIAGNOSIS — E1122 Type 2 diabetes mellitus with diabetic chronic kidney disease: Secondary | ICD-10-CM | POA: Diagnosis not present

## 2017-06-26 DIAGNOSIS — E785 Hyperlipidemia, unspecified: Secondary | ICD-10-CM | POA: Diagnosis not present

## 2017-06-26 DIAGNOSIS — E1140 Type 2 diabetes mellitus with diabetic neuropathy, unspecified: Secondary | ICD-10-CM | POA: Diagnosis not present

## 2017-06-26 DIAGNOSIS — Z8546 Personal history of malignant neoplasm of prostate: Secondary | ICD-10-CM | POA: Diagnosis not present

## 2017-06-26 DIAGNOSIS — R262 Difficulty in walking, not elsewhere classified: Secondary | ICD-10-CM | POA: Diagnosis not present

## 2017-06-26 DIAGNOSIS — K219 Gastro-esophageal reflux disease without esophagitis: Secondary | ICD-10-CM | POA: Diagnosis not present

## 2017-06-26 DIAGNOSIS — S86012A Strain of left Achilles tendon, initial encounter: Secondary | ICD-10-CM | POA: Diagnosis not present

## 2017-06-26 DIAGNOSIS — X58XXXA Exposure to other specified factors, initial encounter: Secondary | ICD-10-CM | POA: Diagnosis not present

## 2017-06-26 DIAGNOSIS — M109 Gout, unspecified: Secondary | ICD-10-CM | POA: Diagnosis not present

## 2017-06-26 DIAGNOSIS — Z79899 Other long term (current) drug therapy: Secondary | ICD-10-CM | POA: Diagnosis not present

## 2017-06-26 DIAGNOSIS — Z01812 Encounter for preprocedural laboratory examination: Secondary | ICD-10-CM

## 2017-06-26 DIAGNOSIS — Z7984 Long term (current) use of oral hypoglycemic drugs: Secondary | ICD-10-CM | POA: Diagnosis not present

## 2017-06-26 DIAGNOSIS — N182 Chronic kidney disease, stage 2 (mild): Secondary | ICD-10-CM | POA: Diagnosis not present

## 2017-06-26 DIAGNOSIS — Y9289 Other specified places as the place of occurrence of the external cause: Secondary | ICD-10-CM | POA: Diagnosis not present

## 2017-06-26 DIAGNOSIS — G2581 Restless legs syndrome: Secondary | ICD-10-CM | POA: Diagnosis not present

## 2017-06-26 DIAGNOSIS — I129 Hypertensive chronic kidney disease with stage 1 through stage 4 chronic kidney disease, or unspecified chronic kidney disease: Secondary | ICD-10-CM | POA: Diagnosis not present

## 2017-06-26 LAB — BASIC METABOLIC PANEL
Anion gap: 7 (ref 5–15)
BUN: 15 mg/dL (ref 6–20)
CO2: 28 mmol/L (ref 22–32)
Calcium: 9.7 mg/dL (ref 8.9–10.3)
Chloride: 104 mmol/L (ref 101–111)
Creatinine, Ser: 1.19 mg/dL (ref 0.61–1.24)
GFR calc Af Amer: >60 mL/min (ref >60)
GFR calc non Af Amer: >60 mL/min (ref >60)
Glucose, Bld: 153 mg/dL — ABNORMAL HIGH (ref 65–99)
Potassium: 4.5 mmol/L (ref 3.5–5.1)
Sodium: 139 mmol/L (ref 135–145)

## 2017-06-26 LAB — CBC
HCT: 39.5 % (ref 39.0–52.0)
Hemoglobin: 13.3 g/dL (ref 13.0–17.0)
MCH: 29.7 pg (ref 26.0–34.0)
MCHC: 33.7 g/dL (ref 30.0–36.0)
MCV: 88.2 fL (ref 78.0–100.0)
Platelets: 293 10*3/uL (ref 150–400)
RBC: 4.48 MIL/uL (ref 4.22–5.81)
RDW: 15.4 % (ref 11.5–15.5)
WBC: 6.1 10*3/uL (ref 4.0–10.5)

## 2017-06-26 NOTE — Telephone Encounter (Signed)
"  I'm calling about Jason Wyatt.  He's scheduled for here tomorrow, June 12, with Hardie Pulley.  This patient needs orders in Epic.  If you need to give me a call back, you can reach me at [phone].  Thank you so much and bye now."

## 2017-06-26 NOTE — Progress Notes (Signed)
EKG-11/22/16-epic HGBA1c-06/21/2017-on chart

## 2017-06-26 NOTE — Telephone Encounter (Signed)
Pt asked if he could come to his 06/29/2017 appt at a later time on that day. I transferred pt to schedulers.

## 2017-06-26 NOTE — Telephone Encounter (Signed)
I think I put the appropriate order in under his pre-op orders. If not can we get exactly what the name of the order is that I need to put in?

## 2017-06-26 NOTE — Patient Instructions (Signed)
Jason Wyatt  06/26/2017   Your procedure is scheduled on: 06/27/2017    Report to [facility] Main  Entrance  Report to admitting at   12 noon     Call this number if you have problems the morning of surgery [phone]   Remember: Do not eat food after midnite.  May have clear liquids from 12 midnite until 0800am morning  of surgery then nothing by  Mouth.      Take these medicines the morning of surgery with A SIP OF WATER: Sinemet, allegra I fneeded, gabapentin, , protonix  DO NOT TAKE ANY DIABETIC MEDICATIONS DAY OF YOUR SURGERY                               You may not have any metal on your body including hair pins and              piercings  Do not wear jewelry,  lotions, powders or perfumes, deodorant             Do not wear nail polish.  Do not shave  48 hours prior to surgery.              Men may shave face and neck.   Do not bring valuables to the hospital. [place].  Contacts, dentures or bridgework may not be worn into surgery.      Patients discharged the day of surgery will not be allowed to drive home.  Name and phone number of your driver:     Jason Wyatt Allowed                                                                     Foods Excluded  Coffee and tea, regular and decaf                             liquids that you cannot  Plain Jell-O in any flavor                                             see through such as: Fruit ices (not with fruit pulp)                                     milk, soups, orange juice  Iced Popsicles                                    All solid food Carbonated beverages, regular and diet  Cranberry, grape and apple juices Sports drinks like Gatorade Lightly seasoned clear broth or consume(fat free) Sugar, honey syrup  Sample Menu Breakfast                                Lunch                                      Supper Cranberry juice                    Beef broth                            Chicken broth Jell-O                                     Grape juice                           Apple juice Coffee or tea                        Jell-O                                      Popsicle                                                Coffee or tea                        Coffee or tea  _____________________________________________________________________                Please read over the following fact sheets you were given: _____________________________________________________________________             [facility] - Preparing for Surgery Before surgery, you can play an important role.  Because skin is not sterile, your skin needs to be as free of germs as possible.  You can reduce the number of germs on your skin by washing with CHG (chlorahexidine gluconate) soap before surgery.  CHG is an antiseptic cleaner which kills germs and bonds with the skin to continue killing germs even after washing. Please DO NOT use if you have an allergy to CHG or antibacterial soaps.  If your skin becomes reddened/irritated stop using the CHG and inform your nurse when you arrive at Short Stay. Do not shave (including legs and underarms) for at least 48 hours prior to the first CHG shower.  You may shave your face/neck. Please follow these instructions carefully:  1.  Shower with CHG Soap the night before surgery and the  morning of Surgery.  2.  If you choose to wash your hair, wash your hair first as usual with your  normal  shampoo.  3.  After you shampoo, rinse your hair and body thoroughly to remove the  shampoo.  4.  Use CHG as you would any other liquid soap.  You can apply chg directly  to the skin and wash                       Gently with a scrungie or clean washcloth.  5.  Apply the CHG Soap to your body ONLY FROM THE NECK DOWN.   Do not use on face/ open                            Wound or open sores. Avoid contact with eyes, ears mouth and genitals (private parts).                       Wash face,  Genitals (private parts) with your normal soap.             6.  Wash thoroughly, paying special attention to the area where your surgery  will be performed.  7.  Thoroughly rinse your body with warm water from the neck down.  8.  DO NOT shower/wash with your normal soap after using and rinsing off  the CHG Soap.                9.  Pat yourself dry with a clean towel.            10.  Wear clean pajamas.            11.  Place clean sheets on your bed the night of your first shower and do not  sleep with pets. Day of Surgery : Do not apply any lotions/deodorants the morning of surgery.  Please wear clean clothes to the hospital/surgery center.  FAILURE TO FOLLOW THESE INSTRUCTIONS MAY RESULT IN THE CANCELLATION OF YOUR SURGERY PATIENT SIGNATURE_________________________________  NURSE SIGNATURE__________________________________  ________________________________________________________________________

## 2017-06-26 NOTE — Telephone Encounter (Signed)
"  This is Jason Wyatt from [name].  I am calling in regards to [employer].  I didn't know he was going to be outpatient.  I'm used to patients that have been inpatient for about 2-3 days.  He didn't tell me that.  He's going to need a FSRA filled out and he has to have been admitted into a nursing facility previously.  I'm not sure if he has."  We can fill the FSRA form out.  Dr. March Rummage said Jason Wyatt may have to stay in the hospital for about 2-3 days after the procedure.  Right now we have him down for a short stay but he said he may have to stay there longer.  "Let me call a [facility] Education officer, museum and see what I can find out about the process and I'll call you back."  "I spoke to Kathrin Greathouse, a Education officer, museum, she said if Dr. March Rummage puts in an order for [facility] for short term rehabilitation they will take care of getting all the paperwork to her.  She said to just let her know when the order is in then she can pull it immediately.  Her phone number is [phone]."  Okay, I'll let Dr. March Rummage know.  "I'll contact Jason Wyatt to get his insurance information."  I can give you that information.  "I know he has [facility]."  Yes, he does and the ID number is [id number].  "What's his date of birth?  It is 13-Apr-1955.  "Okay, I'll get this authorization started."

## 2017-06-26 NOTE — Telephone Encounter (Signed)
Orders in Epic.

## 2017-06-27 ENCOUNTER — Encounter (HOSPITAL_COMMUNITY): Payer: Self-pay | Admitting: Emergency Medicine

## 2017-06-27 ENCOUNTER — Other Ambulatory Visit: Payer: Self-pay

## 2017-06-27 ENCOUNTER — Encounter (HOSPITAL_COMMUNITY)
Admission: AD | Disposition: A | Discharge status: Skilled Nursing Facility | Payer: Self-pay | Source: Ambulatory Visit | Attending: Internal Medicine

## 2017-06-27 ENCOUNTER — Ambulatory Visit (HOSPITAL_COMMUNITY): Payer: Medicare Other | Admitting: Certified Registered Nurse Anesthetist

## 2017-06-27 ENCOUNTER — Inpatient Hospital Stay (HOSPITAL_COMMUNITY): Payer: Medicare Other

## 2017-06-27 ENCOUNTER — Observation Stay (HOSPITAL_COMMUNITY)
Admission: AD | Admit: 2017-06-27 | Discharge: 2017-06-29 | Disposition: A | Discharge status: Skilled Nursing Facility | Payer: Medicare Other | Source: Ambulatory Visit | Attending: Internal Medicine | Admitting: Internal Medicine

## 2017-06-27 DIAGNOSIS — G8918 Other acute postprocedural pain: Secondary | ICD-10-CM | POA: Diagnosis not present

## 2017-06-27 DIAGNOSIS — Z79899 Other long term (current) drug therapy: Secondary | ICD-10-CM | POA: Diagnosis not present

## 2017-06-27 DIAGNOSIS — E1142 Type 2 diabetes mellitus with diabetic polyneuropathy: Secondary | ICD-10-CM | POA: Diagnosis not present

## 2017-06-27 DIAGNOSIS — K219 Gastro-esophageal reflux disease without esophagitis: Secondary | ICD-10-CM | POA: Insufficient documentation

## 2017-06-27 DIAGNOSIS — I129 Hypertensive chronic kidney disease with stage 1 through stage 4 chronic kidney disease, or unspecified chronic kidney disease: Secondary | ICD-10-CM | POA: Insufficient documentation

## 2017-06-27 DIAGNOSIS — E1169 Type 2 diabetes mellitus with other specified complication: Secondary | ICD-10-CM | POA: Diagnosis not present

## 2017-06-27 DIAGNOSIS — Z7984 Long term (current) use of oral hypoglycemic drugs: Secondary | ICD-10-CM | POA: Insufficient documentation

## 2017-06-27 DIAGNOSIS — E1122 Type 2 diabetes mellitus with diabetic chronic kidney disease: Secondary | ICD-10-CM | POA: Insufficient documentation

## 2017-06-27 DIAGNOSIS — E785 Hyperlipidemia, unspecified: Secondary | ICD-10-CM | POA: Insufficient documentation

## 2017-06-27 DIAGNOSIS — S92032S Displaced avulsion fracture of tuberosity of left calcaneus, sequela: Secondary | ICD-10-CM | POA: Diagnosis not present

## 2017-06-27 DIAGNOSIS — Z9889 Other specified postprocedural states: Secondary | ICD-10-CM | POA: Diagnosis not present

## 2017-06-27 DIAGNOSIS — M1A00X0 Idiopathic chronic gout, unspecified site, without tophus (tophi): Secondary | ICD-10-CM

## 2017-06-27 DIAGNOSIS — X58XXXA Exposure to other specified factors, initial encounter: Secondary | ICD-10-CM | POA: Insufficient documentation

## 2017-06-27 DIAGNOSIS — E1140 Type 2 diabetes mellitus with diabetic neuropathy, unspecified: Secondary | ICD-10-CM | POA: Diagnosis not present

## 2017-06-27 DIAGNOSIS — I10 Essential (primary) hypertension: Secondary | ICD-10-CM | POA: Diagnosis present

## 2017-06-27 DIAGNOSIS — N182 Chronic kidney disease, stage 2 (mild): Secondary | ICD-10-CM | POA: Insufficient documentation

## 2017-06-27 DIAGNOSIS — N400 Enlarged prostate without lower urinary tract symptoms: Secondary | ICD-10-CM | POA: Diagnosis not present

## 2017-06-27 DIAGNOSIS — Z7982 Long term (current) use of aspirin: Secondary | ICD-10-CM | POA: Insufficient documentation

## 2017-06-27 DIAGNOSIS — R262 Difficulty in walking, not elsewhere classified: Secondary | ICD-10-CM | POA: Diagnosis not present

## 2017-06-27 DIAGNOSIS — S86019A Strain of unspecified Achilles tendon, initial encounter: Secondary | ICD-10-CM

## 2017-06-27 DIAGNOSIS — S92032A Displaced avulsion fracture of tuberosity of left calcaneus, initial encounter for closed fracture: Secondary | ICD-10-CM | POA: Diagnosis not present

## 2017-06-27 DIAGNOSIS — S86012A Strain of left Achilles tendon, initial encounter: Secondary | ICD-10-CM | POA: Diagnosis not present

## 2017-06-27 DIAGNOSIS — M109 Gout, unspecified: Secondary | ICD-10-CM | POA: Diagnosis present

## 2017-06-27 DIAGNOSIS — G2581 Restless legs syndrome: Secondary | ICD-10-CM | POA: Diagnosis present

## 2017-06-27 DIAGNOSIS — Y9289 Other specified places as the place of occurrence of the external cause: Secondary | ICD-10-CM | POA: Insufficient documentation

## 2017-06-27 DIAGNOSIS — M1000 Idiopathic gout, unspecified site: Secondary | ICD-10-CM | POA: Diagnosis not present

## 2017-06-27 DIAGNOSIS — R2681 Unsteadiness on feet: Secondary | ICD-10-CM

## 2017-06-27 DIAGNOSIS — N183 Chronic kidney disease, stage 3 (moderate): Secondary | ICD-10-CM | POA: Diagnosis not present

## 2017-06-27 DIAGNOSIS — Z8546 Personal history of malignant neoplasm of prostate: Secondary | ICD-10-CM | POA: Insufficient documentation

## 2017-06-27 HISTORY — PX: GASTROC RECESSION EXTREMITY: SHX6262

## 2017-06-27 HISTORY — PX: ACHILLES TENDON SURGERY: SHX542

## 2017-06-27 HISTORY — PX: TENDON TRANSFER: SHX6109

## 2017-06-27 LAB — GLUCOSE, CAPILLARY
Glucose-Capillary: 119 mg/dL — ABNORMAL HIGH (ref 65–99)
Glucose-Capillary: 158 mg/dL — ABNORMAL HIGH (ref 65–99)
Glucose-Capillary: 206 mg/dL — ABNORMAL HIGH (ref 65–99)

## 2017-06-27 SURGERY — RECESSION, TENDON, GASTROCNEMIUS
Anesthesia: General | Site: Ankle | Laterality: Left

## 2017-06-27 MED ORDER — MIDAZOLAM HCL 2 MG/2ML IJ SOLN
INTRAMUSCULAR | Status: AC
  Administered 2017-06-27: 1 mg via INTRAVENOUS
  Filled 2017-06-27: qty 2

## 2017-06-27 MED ORDER — INSULIN ASPART 100 UNIT/ML ~~LOC~~ SOLN
0.0000 [IU] | Freq: Three times a day (TID) | SUBCUTANEOUS | Status: DC
  Administered 2017-06-28: 2 [IU] via SUBCUTANEOUS
  Administered 2017-06-28 – 2017-06-29 (×4): 3 [IU] via SUBCUTANEOUS

## 2017-06-27 MED ORDER — SUCCINYLCHOLINE CHLORIDE 200 MG/10ML IV SOSY
PREFILLED_SYRINGE | INTRAVENOUS | Status: AC
  Filled 2017-06-27: qty 10

## 2017-06-27 MED ORDER — DEXAMETHASONE SODIUM PHOSPHATE 10 MG/ML IJ SOLN
INTRAMUSCULAR | Status: DC | PRN
  Administered 2017-06-27: 4 mg via INTRAVENOUS

## 2017-06-27 MED ORDER — PANTOPRAZOLE SODIUM 40 MG PO TBEC
40.0000 mg | DELAYED_RELEASE_TABLET | Freq: Every day | ORAL | Status: DC
  Administered 2017-06-28 – 2017-06-29 (×2): 40 mg via ORAL
  Filled 2017-06-27 (×2): qty 1

## 2017-06-27 MED ORDER — ACETAMINOPHEN 325 MG PO TABS: 650.0000 mg | ORAL_TABLET | Freq: Four times a day (QID) | ORAL | Status: DC | PRN

## 2017-06-27 MED ORDER — SUGAMMADEX SODIUM 200 MG/2ML IV SOLN
INTRAVENOUS | Status: AC
  Filled 2017-06-27: qty 2

## 2017-06-27 MED ORDER — HYDROCODONE-ACETAMINOPHEN 7.5-325 MG PO TABS: 1.0000 | ORAL_TABLET | Freq: Once | ORAL | Status: DC | PRN

## 2017-06-27 MED ORDER — PHENYLEPHRINE HCL 10 MG/ML IJ SOLN
INTRAMUSCULAR | Status: AC
  Filled 2017-06-27: qty 1

## 2017-06-27 MED ORDER — LIDOCAINE-EPINEPHRINE (PF) 1 %-1:200000 IJ SOLN
INTRAMUSCULAR | Status: AC
  Filled 2017-06-27: qty 30

## 2017-06-27 MED ORDER — FENTANYL CITRATE (PF) 100 MCG/2ML IJ SOLN
INTRAMUSCULAR | Status: AC
  Administered 2017-06-27: 50 ug via INTRAVENOUS
  Filled 2017-06-27: qty 2

## 2017-06-27 MED ORDER — ROCURONIUM BROMIDE 10 MG/ML (PF) SYRINGE
PREFILLED_SYRINGE | INTRAVENOUS | Status: AC
  Filled 2017-06-27: qty 5

## 2017-06-27 MED ORDER — HYDROMORPHONE HCL 1 MG/ML IJ SOLN: 0.2500 mg | INTRAMUSCULAR | Status: DC | PRN

## 2017-06-27 MED ORDER — TAMSULOSIN HCL 0.4 MG PO CAPS
0.4000 mg | ORAL_CAPSULE | Freq: Every day | ORAL | Status: DC
  Administered 2017-06-27 – 2017-06-29 (×2): 0.4 mg via ORAL
  Filled 2017-06-27 (×3): qty 1

## 2017-06-27 MED ORDER — ACETAMINOPHEN 10 MG/ML IV SOLN: 1000.0000 mg | Freq: Once | INTRAVENOUS | Status: DC | PRN

## 2017-06-27 MED ORDER — ENALAPRIL MALEATE 10 MG PO TABS
20.0000 mg | ORAL_TABLET | Freq: Every day | ORAL | Status: DC
  Administered 2017-06-27 – 2017-06-29 (×3): 20 mg via ORAL
  Filled 2017-06-27 (×3): qty 2

## 2017-06-27 MED ORDER — ONDANSETRON HCL 4 MG/2ML IJ SOLN
INTRAMUSCULAR | Status: DC | PRN
  Administered 2017-06-27: 4 mg via INTRAVENOUS

## 2017-06-27 MED ORDER — SUCCINYLCHOLINE CHLORIDE 200 MG/10ML IV SOSY
PREFILLED_SYRINGE | INTRAVENOUS | Status: DC | PRN
  Administered 2017-06-27: 120 mg via INTRAVENOUS

## 2017-06-27 MED ORDER — DEXAMETHASONE SODIUM PHOSPHATE 10 MG/ML IJ SOLN
INTRAMUSCULAR | Status: AC
  Filled 2017-06-27: qty 1

## 2017-06-27 MED ORDER — FENTANYL CITRATE (PF) 100 MCG/2ML IJ SOLN
INTRAMUSCULAR | Status: AC
  Filled 2017-06-27: qty 2

## 2017-06-27 MED ORDER — MAGNESIUM 500 MG PO CAPS: 500.0000 mg | ORAL_CAPSULE | Freq: Every evening | ORAL | Status: DC

## 2017-06-27 MED ORDER — CARBIDOPA-LEVODOPA 25-250 MG PO TABS
1.0000 | ORAL_TABLET | Freq: Two times a day (BID) | ORAL | Status: DC
  Administered 2017-06-27 – 2017-06-29 (×4): 1 via ORAL
  Filled 2017-06-27 (×4): qty 1

## 2017-06-27 MED ORDER — PHENYLEPHRINE HCL 10 MG/ML IJ SOLN
INTRAVENOUS | Status: DC | PRN
  Administered 2017-06-27: 50 ug/min via INTRAVENOUS

## 2017-06-27 MED ORDER — CEFAZOLIN SODIUM-DEXTROSE 2-4 GM/100ML-% IV SOLN: 2.0000 g | INTRAVENOUS | Status: DC

## 2017-06-27 MED ORDER — EPHEDRINE 5 MG/ML INJ
INTRAVENOUS | Status: AC
  Filled 2017-06-27: qty 10

## 2017-06-27 MED ORDER — GABAPENTIN 300 MG PO CAPS
600.0000 mg | ORAL_CAPSULE | Freq: Three times a day (TID) | ORAL | Status: DC
  Administered 2017-06-27 – 2017-06-29 (×5): 600 mg via ORAL
  Filled 2017-06-27 (×5): qty 2

## 2017-06-27 MED ORDER — MEPERIDINE HCL 50 MG/ML IJ SOLN: 6.2500 mg | INTRAMUSCULAR | Status: DC | PRN

## 2017-06-27 MED ORDER — INSULIN ASPART 100 UNIT/ML ~~LOC~~ SOLN: 0.0000 [IU] | Freq: Every day | SUBCUTANEOUS | Status: DC

## 2017-06-27 MED ORDER — GLYCOPYRROLATE 0.2 MG/ML IV SOSY
PREFILLED_SYRINGE | INTRAVENOUS | Status: AC
  Filled 2017-06-27: qty 3

## 2017-06-27 MED ORDER — ONDANSETRON HCL 4 MG/2ML IJ SOLN: 4.0000 mg | Freq: Four times a day (QID) | INTRAMUSCULAR | Status: DC | PRN

## 2017-06-27 MED ORDER — SUGAMMADEX SODIUM 200 MG/2ML IV SOLN
INTRAVENOUS | Status: DC | PRN
  Administered 2017-06-27: 300 mg via INTRAVENOUS

## 2017-06-27 MED ORDER — CEFAZOLIN SODIUM-DEXTROSE 2-4 GM/100ML-% IV SOLN
2.0000 g | INTRAVENOUS | Status: AC
Start: 2017-06-28 — End: 2017-06-27
  Administered 2017-06-27: 2 g via INTRAVENOUS
  Filled 2017-06-27: qty 100

## 2017-06-27 MED ORDER — ALLOPURINOL 300 MG PO TABS
300.0000 mg | ORAL_TABLET | Freq: Every day | ORAL | Status: DC
  Administered 2017-06-27 – 2017-06-28 (×2): 300 mg via ORAL
  Filled 2017-06-27 (×2): qty 1

## 2017-06-27 MED ORDER — EPHEDRINE SULFATE-NACL 50-0.9 MG/10ML-% IV SOSY
PREFILLED_SYRINGE | INTRAVENOUS | Status: DC | PRN
  Administered 2017-06-27 (×2): 10 mg via INTRAVENOUS

## 2017-06-27 MED ORDER — ROCURONIUM BROMIDE 10 MG/ML (PF) SYRINGE
PREFILLED_SYRINGE | INTRAVENOUS | Status: DC | PRN
  Administered 2017-06-27: 40 mg via INTRAVENOUS
  Administered 2017-06-27: 20 mg via INTRAVENOUS

## 2017-06-27 MED ORDER — ACETAMINOPHEN 500 MG PO TABS: 1000.0000 mg | ORAL_TABLET | Freq: Once | ORAL | Status: DC

## 2017-06-27 MED ORDER — DEXMEDETOMIDINE HCL 200 MCG/2ML IV SOLN
INTRAVENOUS | Status: DC | PRN
  Administered 2017-06-27 (×2): 4 ug via INTRAVENOUS
  Administered 2017-06-27: 10 ug via INTRAVENOUS
  Administered 2017-06-27: 4 ug via INTRAVENOUS
  Administered 2017-06-27: 8 ug via INTRAVENOUS

## 2017-06-27 MED ORDER — LINAGLIPTIN 5 MG PO TABS
5.0000 mg | ORAL_TABLET | Freq: Every day | ORAL | Status: DC
  Administered 2017-06-28 – 2017-06-29 (×2): 5 mg via ORAL
  Filled 2017-06-27 (×2): qty 1

## 2017-06-27 MED ORDER — DEXMEDETOMIDINE HCL IN NACL 200 MCG/50ML IV SOLN
INTRAVENOUS | Status: AC
  Filled 2017-06-27: qty 50

## 2017-06-27 MED ORDER — MIDAZOLAM HCL 2 MG/2ML IJ SOLN
INTRAMUSCULAR | Status: AC
  Filled 2017-06-27: qty 2

## 2017-06-27 MED ORDER — FENTANYL CITRATE (PF) 100 MCG/2ML IJ SOLN
50.0000 ug | INTRAMUSCULAR | Status: AC
  Administered 2017-06-27: 50 ug via INTRAVENOUS

## 2017-06-27 MED ORDER — PROPOFOL 10 MG/ML IV BOLUS
INTRAVENOUS | Status: AC
  Filled 2017-06-27: qty 40

## 2017-06-27 MED ORDER — 0.9 % SODIUM CHLORIDE (POUR BTL) OPTIME
TOPICAL | Status: DC | PRN
  Administered 2017-06-27: 1000 mL

## 2017-06-27 MED ORDER — ROPIVACAINE HCL 5 MG/ML IJ SOLN
INTRAMUSCULAR | Status: DC | PRN
  Administered 2017-06-27: 30 mL via PERINEURAL

## 2017-06-27 MED ORDER — BUPIVACAINE HCL (PF) 0.5 % IJ SOLN
INTRAMUSCULAR | Status: AC
  Filled 2017-06-27: qty 30

## 2017-06-27 MED ORDER — PROPOFOL 10 MG/ML IV BOLUS
INTRAVENOUS | Status: DC | PRN
  Administered 2017-06-27: 150 mg via INTRAVENOUS
  Administered 2017-06-27: 200 mg via INTRAVENOUS

## 2017-06-27 MED ORDER — ONDANSETRON HCL 4 MG/2ML IJ SOLN
INTRAMUSCULAR | Status: AC
  Filled 2017-06-27: qty 2

## 2017-06-27 MED ORDER — LIDOCAINE 2% (20 MG/ML) 5 ML SYRINGE
INTRAMUSCULAR | Status: AC
  Filled 2017-06-27: qty 5

## 2017-06-27 MED ORDER — MAGNESIUM OXIDE 400 (241.3 MG) MG PO TABS
400.0000 mg | ORAL_TABLET | Freq: Every evening | ORAL | Status: DC
  Administered 2017-06-27 – 2017-06-28 (×2): 400 mg via ORAL
  Filled 2017-06-27 (×2): qty 1

## 2017-06-27 MED ORDER — FENTANYL CITRATE (PF) 100 MCG/2ML IJ SOLN
INTRAMUSCULAR | Status: DC | PRN
  Administered 2017-06-27 (×2): 50 ug via INTRAVENOUS
  Administered 2017-06-27: 100 ug via INTRAVENOUS

## 2017-06-27 MED ORDER — MELOXICAM 15 MG PO TABS
15.0000 mg | ORAL_TABLET | Freq: Every day | ORAL | Status: DC | PRN
  Filled 2017-06-27: qty 1

## 2017-06-27 MED ORDER — ONDANSETRON HCL 4 MG PO TABS: 4.0000 mg | ORAL_TABLET | Freq: Four times a day (QID) | ORAL | Status: DC | PRN

## 2017-06-27 MED ORDER — LIDOCAINE HCL (PF) 1 % IJ SOLN
INTRAMUSCULAR | Status: AC
  Filled 2017-06-27: qty 30

## 2017-06-27 MED ORDER — PHENYLEPHRINE 40 MCG/ML (10ML) SYRINGE FOR IV PUSH (FOR BLOOD PRESSURE SUPPORT)
PREFILLED_SYRINGE | INTRAVENOUS | Status: DC | PRN
  Administered 2017-06-27: 80 ug via INTRAVENOUS
  Administered 2017-06-27: 120 ug via INTRAVENOUS
  Administered 2017-06-27 (×3): 80 ug via INTRAVENOUS

## 2017-06-27 MED ORDER — MIDAZOLAM HCL 2 MG/2ML IJ SOLN
1.0000 mg | INTRAMUSCULAR | Status: AC
  Administered 2017-06-27: 1 mg via INTRAVENOUS

## 2017-06-27 MED ORDER — PHENYLEPHRINE 40 MCG/ML (10ML) SYRINGE FOR IV PUSH (FOR BLOOD PRESSURE SUPPORT)
PREFILLED_SYRINGE | INTRAVENOUS | Status: AC
  Filled 2017-06-27: qty 10

## 2017-06-27 MED ORDER — LIDOCAINE 2% (20 MG/ML) 5 ML SYRINGE
INTRAMUSCULAR | Status: DC | PRN
  Administered 2017-06-27: 60 mg via INTRAVENOUS

## 2017-06-27 MED ORDER — ENOXAPARIN SODIUM 40 MG/0.4ML ~~LOC~~ SOLN
40.0000 mg | SUBCUTANEOUS | Status: DC
  Administered 2017-06-28 – 2017-06-29 (×2): 40 mg via SUBCUTANEOUS
  Filled 2017-06-27 (×2): qty 0.4

## 2017-06-27 MED ORDER — ACETAMINOPHEN 650 MG RE SUPP: 650.0000 mg | Freq: Four times a day (QID) | RECTAL | Status: DC | PRN

## 2017-06-27 MED ORDER — OMEGA-3-ACID ETHYL ESTERS 1 G PO CAPS
1.0000 g | ORAL_CAPSULE | Freq: Two times a day (BID) | ORAL | Status: DC
  Administered 2017-06-27 – 2017-06-29 (×4): 1 g via ORAL
  Filled 2017-06-27 (×4): qty 1

## 2017-06-27 MED ORDER — LACTATED RINGERS IV SOLN
INTRAVENOUS | Status: DC
  Administered 2017-06-27 (×2): via INTRAVENOUS

## 2017-06-27 MED ORDER — GLYCOPYRROLATE 0.2 MG/ML IJ SOLN
INTRAMUSCULAR | Status: DC | PRN
  Administered 2017-06-27: 0.2 mg via INTRAVENOUS

## 2017-06-27 MED ORDER — DULOXETINE HCL 60 MG PO CPEP
60.0000 mg | ORAL_CAPSULE | Freq: Every day | ORAL | Status: DC
  Administered 2017-06-27 – 2017-06-28 (×2): 60 mg via ORAL
  Filled 2017-06-27 (×3): qty 1

## 2017-06-27 MED ORDER — PROMETHAZINE HCL 25 MG/ML IJ SOLN: 6.2500 mg | INTRAMUSCULAR | Status: DC | PRN

## 2017-06-27 SURGICAL SUPPLY — 51 items
ANCHOR PEEK SWIVEL LOCK 5.5 (Anchor) ×2 IMPLANT
BAG ZIPLOCK 12X15 (MISCELLANEOUS) ×2 IMPLANT
BANDAGE ACE 6X5 VEL STRL LF (GAUZE/BANDAGES/DRESSINGS) ×2 IMPLANT
BANDAGE ESMARK 6X9 LF (GAUZE/BANDAGES/DRESSINGS) ×1 IMPLANT
BLADE 15 SAFETY STRL DISP (BLADE) ×2 IMPLANT
BLADE SAW SGTL 11.0X1.19X90.0M (BLADE) IMPLANT
BLADE SURG SZ10 CARB STEEL (BLADE) ×2 IMPLANT
BNDG ESMARK 6X9 LF (GAUZE/BANDAGES/DRESSINGS) ×2
COVER SURGICAL LIGHT HANDLE (MISCELLANEOUS) ×2 IMPLANT
CUFF TOURN SGL QUICK 24 (TOURNIQUET CUFF) ×1
CUFF TRNQT CYL 24X4X40X1 (TOURNIQUET CUFF) ×1 IMPLANT
DRESSING PREVENA PLUS CUSTOM (GAUZE/BANDAGES/DRESSINGS) ×1 IMPLANT
DRSG PAD ABDOMINAL 8X10 ST (GAUZE/BANDAGES/DRESSINGS) ×2 IMPLANT
DRSG PREVENA PLUS CUSTOM (GAUZE/BANDAGES/DRESSINGS) ×2
DURAPREP 26ML APPLICATOR (WOUND CARE) ×2 IMPLANT
ELECT REM PT RETURN 15FT ADLT (MISCELLANEOUS) ×2 IMPLANT
GLOVE BIOGEL PI IND STRL 7.0 (GLOVE) ×1 IMPLANT
GLOVE BIOGEL PI IND STRL 7.5 (GLOVE) ×3 IMPLANT
GLOVE BIOGEL PI INDICATOR 7.0 (GLOVE) ×1
GLOVE BIOGEL PI INDICATOR 7.5 (GLOVE) ×3
GLOVE ECLIPSE 7.5 STRL STRAW (GLOVE) ×4 IMPLANT
GOWN STRL REUS W/TWL LRG LVL3 (GOWN DISPOSABLE) ×2 IMPLANT
GOWN STRL REUS W/TWL XL LVL3 (GOWN DISPOSABLE) ×4 IMPLANT
KIT PREVENA INCISION MGT 13 (CANNISTER) ×2 IMPLANT
MANIFOLD NEPTUNE II (INSTRUMENTS) ×2 IMPLANT
NDL SAFETY ECLIPSE 18X1.5 (NEEDLE) ×1 IMPLANT
NEEDLE HYPO 18GX1.5 SHARP (NEEDLE) ×1
PACK ORTHO EXTREMITY (CUSTOM PROCEDURE TRAY) ×2 IMPLANT
PADDING CAST SYN 6 (CAST SUPPLIES) ×1
PADDING CAST SYNTHETIC 6X4 NS (CAST SUPPLIES) ×1 IMPLANT
PASSER SUT SWANSON 36MM LOOP (INSTRUMENTS) IMPLANT
POSITIONER SURGICAL ARM (MISCELLANEOUS) ×2 IMPLANT
STAPLER VISISTAT (STAPLE) ×2 IMPLANT
STRIP CLOSURE SKIN 1/2X4 (GAUZE/BANDAGES/DRESSINGS) ×2 IMPLANT
SUT ETHILON 3 0 PS 1 (SUTURE) ×4 IMPLANT
SUT ETHILON 4 0 PS 2 18 (SUTURE) ×2 IMPLANT
SUT FIBERWIRE #2 38 T-5 BLUE (SUTURE) ×2
SUT MNCRL AB 4-0 PS2 18 (SUTURE) ×2 IMPLANT
SUT SILK 2 0 30  PSL (SUTURE) ×2
SUT SILK 2 0 30 PSL (SUTURE) ×2 IMPLANT
SUT VIC AB 1 CT1 27 (SUTURE) ×3
SUT VIC AB 1 CT1 27XBRD ANTBC (SUTURE) ×3 IMPLANT
SUT VIC AB 2-0 CT1 27 (SUTURE) ×2
SUT VIC AB 2-0 CT1 TAPERPNT 27 (SUTURE) ×2 IMPLANT
SUT VIC AB 3-0 CT1 27 (SUTURE) ×1
SUT VIC AB 3-0 CT1 TAPERPNT 27 (SUTURE) ×1 IMPLANT
SUT VIC AB 3-0 SH 27 (SUTURE) ×1
SUT VIC AB 3-0 SH 27X BRD (SUTURE) ×1 IMPLANT
SUTURE FIBERWR #2 38 T-5 BLUE (SUTURE) ×1 IMPLANT
SYR 30ML LL (SYRINGE) ×2 IMPLANT
TOWEL OR 17X26 10 PK STRL BLUE (TOWEL DISPOSABLE) ×4 IMPLANT

## 2017-06-27 NOTE — Progress Notes (Signed)
AssistedDr. Valma Cava with left, ultrasound guided, popliteal/saphenous block. Side rails up, monitors on throughout procedure. See vital signs in flow sheet. Tolerated Procedure well.

## 2017-06-27 NOTE — Anesthesia Postprocedure Evaluation (Signed)
Anesthesia Post Note  Patient: Jason Wyatt  Procedure(s) Performed: GASTROC RECESSION EXTREMITY (Left Ankle) ACHILLES TENDON REPAIR (Left Ankle) TENDON TRANSFER (Left Ankle)     Patient location during evaluation: PACU Anesthesia Type: General Level of consciousness: awake and alert Pain management: pain level controlled Vital Signs Assessment: post-procedure vital signs reviewed and stable Respiratory status: spontaneous breathing, nonlabored ventilation, respiratory function stable and patient connected to nasal cannula oxygen Cardiovascular status: blood pressure returned to baseline and stable Postop Assessment: no apparent nausea or vomiting Anesthetic complications: no    Last Vitals:  Vitals:   06/27/17 1815 06/27/17 1841  BP: 132/80 137/85  Pulse: 81 83  Resp: 10 16  Temp:  36.6 C  SpO2: 97% 99%    Last Pain:  Vitals:   06/27/17 1815  TempSrc:   PainSc: 0-No pain                 Monnie Gudgel DAVID

## 2017-06-27 NOTE — Brief Op Note (Signed)
06/27/2017  5:27 PM  PATIENT:  [name]  62 y.o. male  PRE-OPERATIVE DIAGNOSIS:  RUPTURE OF ACHILLES TENDON  POST-OPERATIVE DIAGNOSIS:  RUPTURE OF ACHILLES TENDON  PROCEDURE:  Procedure(s): GASTROC RECESSION EXTREMITY (Left) ACHILLES TENDON REPAIR (Left) TENDON TRANSFER (Left)  SURGEON:  Surgeon(s) and Role:    * Evelina Bucy, DPM - Primary    * Trula Slade, DPM - Assisting  PHYSICIAN ASSISTANT:   ASSISTANTS: none   ANESTHESIA:   regional and general  EBL:  5 mL   BLOOD ADMINISTERED:none  DRAINS: none   LOCAL MEDICATIONS USED:  NONE  SPECIMEN:  No Specimen  DISPOSITION OF SPECIMEN:  N/A  COUNTS:  YES  TOURNIQUET:  * Missing tourniquet times found for documented tourniquets in log: [id number] *  DICTATION: .Note written in EPIC  PLAN OF CARE: Admit for overnight observation  PATIENT DISPOSITION:  PACU - hemodynamically stable.   Delay start of Pharmacological VTE agent (>24hrs) due to surgical blood loss or risk of bleeding: not applicable

## 2017-06-27 NOTE — Anesthesia Procedure Notes (Signed)
Procedure Name: Intubation Date/Time: 06/27/2017 3:09 PM Performed by: Claudia Desanctis, CRNA Pre-anesthesia Checklist: Patient identified, Emergency Drugs available, Suction available and Patient being monitored Patient Re-evaluated:Patient Re-evaluated prior to induction Oxygen Delivery Method: Circle system utilized Preoxygenation: Pre-oxygenation with 100% oxygen Induction Type: IV induction and Rapid sequence Laryngoscope Size: Glidescope and 4 Grade View: Grade I Tube type: Oral Tube size: 7.5 mm Number of attempts: 1 Airway Equipment and Method: Video-laryngoscopy Placement Confirmation: ETT inserted through vocal cords under direct vision,  positive ETCO2 and breath sounds checked- equal and bilateral Secured at: 23 cm Tube secured with: Tape Dental Injury: Teeth and Oropharynx as per pre-operative assessment

## 2017-06-27 NOTE — Interval H&P Note (Signed)
History and Physical Interval Note:  06/27/2017 2:57 PM  Jason Wyatt  has presented today for surgery, with the diagnosis of RUPTURE OF ACHILLES TENDON  The various methods of treatment have been discussed with the patient and family. After consideration of risks, benefits and other options for treatment, the patient has consented to  Procedure(s): GASTROC RECESSION EXTREMITY (Left) as a surgical intervention .  The patient's history has been reviewed, patient examined, no change in status, stable for surgery.  I have reviewed the patient's chart and labs.  Questions were answered to the patient's satisfaction.     Evelina Bucy

## 2017-06-27 NOTE — Anesthesia Preprocedure Evaluation (Addendum)
Anesthesia Evaluation  Patient identified by MRN, date of birth, ID band Patient awake    Reviewed: Allergy & Precautions, NPO status , Patient's Chart, lab work & pertinent test results  Airway Mallampati: III  TM Distance: >3 FB Neck ROM: Full    Dental no notable dental hx. (+) Teeth Intact, Dental Advisory Given   Pulmonary sleep apnea and Continuous Positive Airway Pressure Ventilation ,    Pulmonary exam normal breath sounds clear to auscultation       Cardiovascular hypertension, + Peripheral Vascular Disease  negative cardio ROS Normal cardiovascular exam Rhythm:Regular Rate:Normal     Neuro/Psych    GI/Hepatic GERD  Medicated,  Endo/Other  diabetes, Type 2Morbid obesity  Renal/GU Renal disease     Musculoskeletal negative musculoskeletal ROS (+)   Abdominal (+) + obese,   Peds  Hematology   Anesthesia Other Findings   Reproductive/Obstetrics                           Anesthesia Physical Anesthesia Plan  ASA: III  Anesthesia Plan: MAC and Regional   Post-op Pain Management:    Induction: Intravenous  PONV Risk Score and Plan: Treatment may vary due to age or medical condition  Airway Management Planned: Mask, Natural Airway and Nasal Cannula  Additional Equipment:   Intra-op Plan:   Post-operative Plan:   Informed Consent: I have reviewed the patients History and Physical, chart, labs and discussed the procedure including the risks, benefits and alternatives for the proposed anesthesia with the patient or authorized representative who has indicated his/her understanding and acceptance.     Plan Discussed with: CRNA  Anesthesia Plan Comments:        Anesthesia Quick Evaluation

## 2017-06-27 NOTE — Op Note (Addendum)
Patient Name: Jason Wyatt DOB: October 19, 1955  MRN: [id number]  Date of Service: 06/27/17  Surgeon: Dr. Hardie Pulley, DPM Assistants: Dr. Jacqualyn Posey Pre-operative Diagnosis: Achilles rupture L ankle Post-operative Diagnosis: same Procedures:             1) Repair of Achilles Rupture L Ankle  2) Flexor Tendon Transfer L Ankle  3) Application of wound VAC Pathology/Specimens:             1) None Anesthesia: General/Regional Hemostasis: Anatomic Estimated Blood Loss: 79ml Materials: None Medications: none Complications: None  Indications for Procedure: This is a 62 year old male with a ruptured Achilles tendon to the left ankle.  Discussed the patient he would benefit from repair of the Achilles tendon due to the deficit he may also require flexor tendon transfer all response and is worse when the patient no guarantees given.  Procedure in Detail: Patient was identified in pre-operative holding area. Formal consent was signed and the left lower extremity was marked. Patient was brought back to the operating room and placed on the operating room table in the prone position. Anesthesia was induced. The left lower extremity was prepped and draped in the usual sterile fashion. Timeout was taken to confirm patient name, laterality, and procedure prior to incision.  A negative Thompson test was noted operatively. Attention was then directed to the left ankle where a linear incision was made overlying the posterior aspect of the left ankle.  Dissection was carried down to the skin subtenons tissue with care to avoid all vital neurovascular structures.  Dissection was carried down the level of the peritenon peritenon was gently incised and tagged for later transfer.  The Achilles tendon and was found was noted to be rather intact however there was a large deficit in the tendon would not pull proximally.  Distally there was no tendon attachments for the tendon to attached to and thus flexor tendon transfer  was indicated.  The posterior septum was incised and the posterior flexor retinaculum similarly incised.  The flexor  hallucis longus tendon was then found within the external the tendon was grasped dissected as distally as possible held and cut this allowed for transfer of the flexor tendon to the posterior aspect the calcaneus.  To facilitate this a spike and tap for an Arthrex swivel lock anchor were applied to the superior aspect of the calcaneus.  FiberWire was used to perform a Krakw like stitch into the flexor hallucis longus tendon; this was then fed through the anchor and the anchor was adhered into the posterior calcaneus under neutral tension with good strength noted.  Following this the incision was copious irrigated.  The posterior septum was repaired.  The remainder of the Achilles tendon was sutured to the septum to strengthen the repair.  The incision was then closed in layers with 2-0 Vicryl 4-0 Vicryl four 3-0 nylon and skin staples.  A positive Thompson test was then noted at this time a Praveena wound VAC was applied followed by cast padding and Ace bandage. Patient tolerated the procedure well.  Disposition: Following a period of post-operative monitoring, patient will be transferred to the floor for admission.  He is a fall risk and is to be nonweightbearing to left lower extremity.  He would likely benefit from PT eval with placement at a rehab facility postoperatively.  He would benefit from 6 weeks of Lovenox for DVT prophylaxis.

## 2017-06-27 NOTE — Anesthesia Procedure Notes (Signed)
Anesthesia Regional Block: Popliteal block   Pre-Anesthetic Checklist: ,, timeout performed, Correct Patient, Correct Site, Correct Laterality, Correct Procedure, Correct Position, site marked, Risks and benefits discussed, pre-op evaluation,  At surgeon's request and post-op pain management  Laterality: Left  Prep: Maximum Sterile Barrier Precautions used, chloraprep       Needles:  Injection technique: Single-shot  Needle Type: Echogenic Needle     Needle Length: 9cm  Needle Gauge: 21     Additional Needles:   Procedures:, nerve stimulator,,, ultrasound used (permanent image in chart),,,,  Narrative:  Start time: 06/27/2017 1:40 PM End time: 06/27/2017 1:52 PM Injection made incrementally with aspirations every 5 mL. Anesthesiologist: Barnet Glasgow, MD  Additional Notes: 2% Lidocaine skin wheel. Saphenous block with 10cc of 0.5% Bupivicaine plain.

## 2017-06-27 NOTE — Transfer of Care (Signed)
Immediate Anesthesia Transfer of Care Note  Patient: Jason Wyatt  Procedure(s) Performed: GASTROC RECESSION EXTREMITY (Left Ankle) ACHILLES TENDON REPAIR (Left Ankle) TENDON TRANSFER (Left Ankle)  Patient Location: PACU  Anesthesia Type:General  Level of Consciousness: drowsy and patient cooperative  Airway & Oxygen Therapy: Patient Spontanous Breathing and Patient connected to face mask  Post-op Assessment: Report given to RN and Post -op Vital signs reviewed and stable  Post vital signs: Reviewed and stable  Last Vitals:  Vitals Value Taken Time  BP    Temp    Pulse    Resp    SpO2      Last Pain:  Vitals:   06/27/17 1237  TempSrc:   PainSc: 0-No pain      Patients Stated Pain Goal: 4 (23/36/12 2449)  Complications: No apparent anesthesia complications

## 2017-06-27 NOTE — H&P (Signed)
History and Physical    Jason Wyatt RJJ:[id number] DOB: [birth year] DOA: 06/27/2017    PCP: Unk Pinto, MD  Patient coming from: home  Chief Complaint: achilles tendon ruptur  HPI: Jason Wyatt is a 62 y.o. male with medical history of diabetes mellitus,, hypertension, prostate cancer, gout, morbid obesity and gastroesophageal reflux disease.  Patient has underwent a repair of A left Achilles tendon rupture.  He is being admitted on the request of Dr. Hardie Pulley (podiatry) to the hospital as he is a high fall risk and risk for rerupture of the same tendon.   ED Course: Direct admit from postop  Review of Systems:  All other systems reviewed and apart from HPI, are negative.  Past Medical History:  Diagnosis Date  . Acute meniscal tear of left knee   . Allergic rhinitis   . Arthritis     back  . Borderline glaucoma   . Chronic back pain    stenosis  . CKD (chronic kidney disease), stage II   . Complication of anesthesia cervical fusion surgery 06/2010   "woke up next day w/ventilator on" told due to OSA  . GERD (gastroesophageal reflux disease)    takes Prevacid daily  . Gout    takes Allopurinol daily and Colchicine if needed  . History of adenomatous polyp of colon    tubular adenoma's  . History of Barrett's esophagus   . History of hiatal hernia   . Hyperlipidemia   . Hypertension   . Incomplete right bundle branch block (RBBB)   . OA (osteoarthritis)    knee  . OSA on CPAP   . Peripheral neuropathy   . Prostate cancer [facility]) urologist-  dr Alinda Money   dx 2013  s/p  radical non-nerve sparing prostatectomy 2014--  Stage pT2cNx,  PSA 3.48,  Gleason 3+3,  vol 55cc/  current PSA  0.01 (Aug 2016)  . Thyroid nodule    left side  . Type II diabetes mellitus ([place])   . Urinary frequency    takes Flomax daily  . Urine incontinence   . Wears glasses     Past Surgical History:  Procedure Laterality Date  . ANAL FISSURE REPAIR  06-05-2002  . BACK SURGERY    .  CARDIAC CATHETERIZATION    . CARDIOVASCULAR STRESS TEST  01-01-2008   normal nuclear study/  no ischemia/  normal LV function and wall motion , 57%  . ESOPHAGOGASTRODUODENOSCOPY  09/26/2011   Procedure: ESOPHAGOGASTRODUODENOSCOPY (EGD);  Surgeon: Cleotis Nipper, MD;  Location: [facility];  Service: Endoscopy;  Laterality: N/A;  . KNEE ARTHROSCOPY WITH MEDIAL MENISECTOMY Left 12/31/2014   Procedure: LEFT KNEE ARTHROSCOPY, PARTIAL MEDIAL AND LATERAL  MENISECTOMIES WITH [place];  Surgeon: Susa Day, MD;  Location: [place];  Service: Orthopedics;  Laterality: Left;  . LEFT AND RIGHT HEART CATHETERIZATION WITH CORONARY ANGIOGRAM N/A 08/25/2011   Procedure: LEFT AND RIGHT HEART CATHETERIZATION WITH CORONARY ANGIOGRAM;  Surgeon: Peter M Martinique, MD;  Location: [facility] CATH LAB;  Service: Cardiovascular;  Laterality: N/A;   Normal coronary arteries and LVF, ef 55-60%  . POSTERIOR FUSION CERVICAL SPINE  07-05-2010   C1 --  C4  . POSTERIOR LUMBAR FUSION  05-18-2014   L4 -- S1  . ROBOT ASSISTED LAPAROSCOPIC RADICAL PROSTATECTOMY  02/07/2012   Procedure: ROBOTIC ASSISTED LAPAROSCOPIC RADICAL PROSTATECTOMY LEVEL 1;  Surgeon: Molli Hazard, MD;  Location: [facility];  Service: Urology;  Laterality: N/A;     . TOTAL KNEE ARTHROPLASTY  Left 08/05/2015   Procedure: LEFT TOTAL KNEE ARTHROPLASTY;  Surgeon: Susa Day, MD;  Location: [facility];  Service: Orthopedics;  Laterality: Left;  . TRANSTHORACIC ECHOCARDIOGRAM  08-03-2011   mild LVH, ef 55-60%/  trivial TR and PR  . UVULOPALATOPHARYNGOPLASTY (UPPP)/TONSILLECTOMY/SEPTOPLASTY  1990's   and Adenoidectomy  . wisdom teeth extracted   1982    Social History:   reports that he has never smoked. He has never used smokeless tobacco. He reports that he does not drink alcohol or use drugs.  Allergies  Allergen Reactions  . Flaxseed [Linseed Oil] Rash  . Lyrica [Pregabalin] Itching and Rash  . Niaspan [Niacin Er] Rash    Family  History  Problem Relation Age of Onset  . Hypertension Mother   . Dementia Mother   . Osteoporosis Mother   . Osteoarthritis Mother   . Cancer Father   . Hyperlipidemia Sister   . Hypertension Sister      Prior to Admission medications   Medication Sig Start Date End Date Taking? Authorizing Provider  allopurinol (ZYLOPRIM) 300 MG tablet TAKE 1 TABLET BY MOUTH AT  BEDTIME 06/25/17  Yes Corbett, Caryl Pina, NP  carbidopa-levodopa (SINEMET IR) 25-250 MG tablet TAKE 1 TABLET BY MOUTH TWO  TIMES DAILY FOR RESTLESS  LEGS 06/25/17  Yes Liane Comber, NP  doxycycline (VIBRAMYCIN) 100 MG capsule Take 1 capsule (100 mg total) by mouth 2 (two) times daily. 06/08/17  Yes Edrick Kins, DPM  DULoxetine (CYMBALTA) 60 MG capsule TAKE 1 CAPSULE BY MOUTH AT  BEDTIME 11/17/16  Yes Vicie Mutters, PA-C  enalapril (VASOTEC) 20 MG tablet TAKE 1 TABLET BY MOUTH  TWICE A DAY 06/25/17  Yes Liane Comber, NP  fexofenadine (ALLEGRA) 180 MG tablet Take 180 mg by mouth daily as needed for allergies.    Yes [provider]  gabapentin (NEURONTIN) 300 MG capsule TAKE TWO CAPSULES BY MOUTH  THREE TIMES DAILY. 06/25/17  Yes Liane Comber, NP  JANUVIA 100 MG tablet TAKE 1 TABLET BY MOUTH  DAILY 06/15/16  Yes Unk Pinto, MD  Magnesium 500 MG CAPS Take 500 mg by mouth every evening.    Yes [provider]  meloxicam (MOBIC) 15 MG tablet Take 15 mg by mouth daily as needed for pain.  10/18/15  Yes [provider]  metFORMIN (GLUCOPHAGE) 1000 MG tablet TAKE 1 TABLET BY MOUTH  TWICE A DAY WITH MEALS 05/24/17  Yes Vicie Mutters, PA-C  methocarbamol (ROBAXIN) 500 MG tablet Take 1 tablet (500 mg total) by mouth 3 (three) times daily. Patient taking differently: Take 500 mg by mouth daily as needed for muscle spasms.  03/20/16  Yes Forcucci, Courtney, PA-C  Multiple Vitamin (MULTIVITAMIN) tablet Take 1 tablet by mouth daily.   Yes [provider]  Omega-3 Fatty Acids (FISH OIL) 1200 MG CAPS  Take 1,200 mg by mouth 2 (two) times daily.   Yes [provider]  pantoprazole (PROTONIX) 40 MG tablet TAKE 1 TABLET BY MOUTH  DAILY 05/24/17  Yes Vicie Mutters, PA-C  predniSONE (DELTASONE) 20 MG tablet 2 tablets daily for 3 days, 1 tablet daily for 4 days. 05/28/17  Yes Liane Comber, NP  tamsulosin (FLOMAX) 0.4 MG CAPS capsule Take 1 capsule (0.4 mg total) daily after supper by mouth. 11/22/16  Yes Vicie Mutters, PA-C  Vitamin D, Ergocalciferol, (DRISDOL) 50000 units CAPS capsule Take 1 capsule (50,000 Units total) by mouth as directed. Tuesday , Wednesday, Thursday, Sunday Patient taking differently: Take 50,000 Units by mouth 2 (two)  times a week. Wednesday and Sunday 04/06/15  Yes Vicie Mutters, PA-C  ACCU-CHEK SOFTCLIX LANCETS lancets CHECK BLOOD SUGAR 1 TIME  DAILY 04/21/17   Unk Pinto, MD  acetaminophen (TYLENOL) 500 MG tablet Take 500 mg by mouth every 6 (six) hours as needed for moderate pain.    [provider]  acyclovir (ZOVIRAX) 400 MG tablet TAKE 1 TABLET BY MOUTH  DAILY AS NEEDED FOR COLD  SORES 05/05/17   Unk Pinto, MD  aspirin EC 81 MG tablet Take 81 mg by mouth daily.    [provider]  blood glucose meter kit and supplies KIT by Does not apply route daily as needed. Dispense based on patient and insurance preference. Use up to four times daily as directed. (FOR ICD-9 250.00, 250.01).    [provider]  clobetasol cream (TEMOVATE) 6.14 % Apply 1 application topically 2 (two) times daily. Patient not taking: Reported on 06/22/2017 03/20/16   Starlyn Skeans, PA-C  colchicine 0.6 MG tablet Take 1 tablet (0.6 mg total) by mouth 2 (two) times daily as needed (gout flare ups). Patient not taking: Reported on 05/28/2017 03/26/15   Unk Pinto, MD  glucose blood (ONE TOUCH ULTRA TEST) test strip CHECK BLOOD SUGAR 1 TIME  DAILY 04/21/17   Unk Pinto, MD  glucose blood test strip 1 each by Other route as needed for other. Use as  instructed    [provider]  omega-3 acid ethyl esters (LOVAZA) 1 g capsule TAKE 2 BY MOUTH TWICE DAILY 03/26/15   Unk Pinto, MD    Physical Exam: Wt Readings from Last 3 Encounters:  06/27/17 111.6 kg (246 lb)  06/26/17 111.6 kg (246 lb)  05/28/17 (!) 140.6 kg (310 lb)   Vitals:   06/27/17 1417 06/27/17 1422 06/27/17 1427 06/27/17 1732  BP: (!) 146/94 (!) 142/95 (!) 151/89 (!) (P) 143/87  Pulse: 71 72 65 (P) 90  Resp: _0 (P) 14  Temp:      TempSrc:      SpO2: 100% 100% 100% (P) 100%  Weight:      Height:          Constitutional: NAD, calm, comfortable Eyes: PERTLA, lids and conjunctivae normal ENMT: Mucous membranes are moist. Posterior pharynx clear of any exudate or lesions. Normal dentition.  Neck: normal, supple, no masses, no thyromegaly Respiratory: clear to auscultation bilaterally, no wheezing, no crackles. Normal respiratory effort. No accessory muscle use.  Cardiovascular: S1 & S2 heard, regular rate and rhythm, no murmurs / rubs / gallops. No extremity edema. 2+ pedal pulses. No carotid bruits.  Abdomen: No distension, no tenderness, no masses palpated. No hepatosplenomegaly. Bowel sounds normal.  Musculoskeletal: no clubbing / cyanosis. No joint deformity upper and lower extremities. Good ROM, no contractures. Normal muscle tone.  Skin: no rashes, lesions, ulcers. No induration Neurologic: CN 2-12 grossly intact. Sensation intact, DTR normal. Strength 5/5 in all 4 limbs.  Psychiatric: Normal judgment and insight. Alert and oriented x 3. Normal mood.     Labs on Admission: I have personally reviewed following labs and imaging studies  CBC: Recent Labs  Lab 06/26/17 1029  WBC 6.1  HGB 13.3  HCT 39.5  MCV 88.2  PLT 431   Basic Metabolic Panel: Recent Labs  Lab 06/26/17 1019  NA 139  K 4.5  CL 104  CO2 28  GLUCOSE 153*  BUN 15  CREATININE 1.19  CALCIUM 9.7   GFR: Estimated Creatinine Clearance: 83.4 mL/min (by C-G formula  based on SCr of 1.19 mg/dL). Liver Function Tests: No results for input(s): AST, ALT, ALKPHOS, BILITOT, PROT, ALBUMIN in the last 168 hours. No results for input(s): LIPASE, AMYLASE in the last 168 hours. No results for input(s): AMMONIA in the last 168 hours. Coagulation Profile: No results for input(s): INR, PROTIME in the last 168 hours. Cardiac Enzymes: No results for input(s): CKTOTAL, CKMB, CKMBINDEX, TROPONINI in the last 168 hours. BNP (last 3 results) No results for input(s): PROBNP in the last 8760 hours. HbA1C: No results for input(s): HGBA1C in the last 72 hours. CBG: Recent Labs  Lab 06/27/17 1208  GLUCAP 119*   Lipid Profile: No results for input(s): CHOL, HDL, LDLCALC, TRIG, CHOLHDL, LDLDIRECT in the last 72 hours. Thyroid Function Tests: No results for input(s): TSH, T4TOTAL, FREET4, T3FREE, THYROIDAB in the last 72 hours. Anemia Panel: No results for input(s): VITAMINB12, FOLATE, FERRITIN, TIBC, IRON, RETICCTPCT in the last 72 hours. Urine analysis:    Component Value Date/Time   COLORURINE DARK YELLOW 11/22/2016 0959   APPEARANCEUR CLEAR 11/22/2016 0959   LABSPEC 1.024 11/22/2016 0959   PHURINE 7.5 11/22/2016 0959   GLUCOSEU NEGATIVE 11/22/2016 0959   HGBUR NEGATIVE 11/22/2016 0959   BILIRUBINUR NEGATIVE 07/28/2015 0940   KETONESUR NEGATIVE 11/22/2016 0959   PROTEINUR TRACE (A) 11/22/2016 0959   UROBILINOGEN 0.2 04/07/2013 0948   NITRITE NEGATIVE 11/22/2016 0959   LEUKOCYTESUR NEGATIVE 11/22/2016 0959   Sepsis Labs: _0 (procalcitonin:4,lacticidven:4) )No results found for this or any previous visit (from the past 240 hour(s)).   Radiological Exams on Admission: No results found.   Assessment/Plan Active Problems:   Gait instability -Due to left Achilles tendon rupture and repair -Nonweightbearing left foot -PT eval -Pain control with Tylenol and Mobic -DVT prophylaxis with Lovenox for total of 6 weeks per Dr. March Rummage  Diabetes mellitus  type 2 with neuropathy - He takes Januvia and metformin at [place] is being replaced by Tradjenta in the hospital -We will order an insulin sliding scale - cont Gabapentin  Hypertension -Continue Vasotec at 20 mg daily-she takes it 20 twice daily at home  Gout -Continue allopurinol  Restless leg syndrome -Continue Sinemet twice daily  BPH -Continue Flomax daily   DVT prophylaxis: Lovenox Code Status: Full code Family Communication:   Disposition Plan: MedSurg Consults called:  podiatry-Dr. March Rummage Admission status: Admit   Debbe Odea MD [facility] Pager: [email or web address] Password [email or web address] 7PM-7AM, please contact night-coverage   06/27/2017, 5:49 PM

## 2017-06-28 ENCOUNTER — Telehealth: Payer: Self-pay | Admitting: Podiatry

## 2017-06-28 ENCOUNTER — Encounter (HOSPITAL_COMMUNITY): Payer: Self-pay | Admitting: Podiatry

## 2017-06-28 DIAGNOSIS — S86012A Strain of left Achilles tendon, initial encounter: Secondary | ICD-10-CM | POA: Diagnosis not present

## 2017-06-28 DIAGNOSIS — I129 Hypertensive chronic kidney disease with stage 1 through stage 4 chronic kidney disease, or unspecified chronic kidney disease: Secondary | ICD-10-CM | POA: Diagnosis not present

## 2017-06-28 DIAGNOSIS — Z7982 Long term (current) use of aspirin: Secondary | ICD-10-CM | POA: Diagnosis not present

## 2017-06-28 DIAGNOSIS — M109 Gout, unspecified: Secondary | ICD-10-CM | POA: Diagnosis not present

## 2017-06-28 DIAGNOSIS — G2581 Restless legs syndrome: Secondary | ICD-10-CM | POA: Diagnosis not present

## 2017-06-28 DIAGNOSIS — E1140 Type 2 diabetes mellitus with diabetic neuropathy, unspecified: Secondary | ICD-10-CM | POA: Diagnosis not present

## 2017-06-28 DIAGNOSIS — R262 Difficulty in walking, not elsewhere classified: Secondary | ICD-10-CM | POA: Diagnosis not present

## 2017-06-28 DIAGNOSIS — M1000 Idiopathic gout, unspecified site: Secondary | ICD-10-CM

## 2017-06-28 DIAGNOSIS — E1169 Type 2 diabetes mellitus with other specified complication: Secondary | ICD-10-CM

## 2017-06-28 DIAGNOSIS — E1142 Type 2 diabetes mellitus with diabetic polyneuropathy: Secondary | ICD-10-CM | POA: Diagnosis not present

## 2017-06-28 DIAGNOSIS — N400 Enlarged prostate without lower urinary tract symptoms: Secondary | ICD-10-CM | POA: Diagnosis not present

## 2017-06-28 DIAGNOSIS — N182 Chronic kidney disease, stage 2 (mild): Secondary | ICD-10-CM | POA: Diagnosis not present

## 2017-06-28 DIAGNOSIS — Z7984 Long term (current) use of oral hypoglycemic drugs: Secondary | ICD-10-CM | POA: Diagnosis not present

## 2017-06-28 DIAGNOSIS — E1122 Type 2 diabetes mellitus with diabetic chronic kidney disease: Secondary | ICD-10-CM | POA: Diagnosis not present

## 2017-06-28 DIAGNOSIS — K219 Gastro-esophageal reflux disease without esophagitis: Secondary | ICD-10-CM | POA: Diagnosis not present

## 2017-06-28 DIAGNOSIS — E785 Hyperlipidemia, unspecified: Secondary | ICD-10-CM | POA: Diagnosis not present

## 2017-06-28 DIAGNOSIS — Z79899 Other long term (current) drug therapy: Secondary | ICD-10-CM | POA: Diagnosis not present

## 2017-06-28 DIAGNOSIS — S92032S Displaced avulsion fracture of tuberosity of left calcaneus, sequela: Secondary | ICD-10-CM | POA: Diagnosis not present

## 2017-06-28 DIAGNOSIS — I10 Essential (primary) hypertension: Secondary | ICD-10-CM | POA: Diagnosis not present

## 2017-06-28 LAB — GLUCOSE, CAPILLARY
Glucose-Capillary: 182 mg/dL — ABNORMAL HIGH (ref 65–99)
Glucose-Capillary: 166 mg/dL — ABNORMAL HIGH (ref 65–99)
Glucose-Capillary: 133 mg/dL — ABNORMAL HIGH (ref 65–99)
Glucose-Capillary: 172 mg/dL — ABNORMAL HIGH (ref 65–99)

## 2017-06-28 MED ORDER — POLYETHYLENE GLYCOL 3350 17 G PO PACK: 17.0000 g | PACK | Freq: Every day | ORAL | Status: DC | PRN

## 2017-06-28 NOTE — Care Management Obs Status (Signed)
[place] NOTIFICATION   Patient Details  Name: Jason Wyatt MRN: [id number] Date of Birth: 1955/05/30   Medicare Observation Status Notification Given:  Yes    Guadalupe Maple, RN 06/28/2017, 4:27 PM

## 2017-06-28 NOTE — Telephone Encounter (Signed)
Left message on home answering machine to get Jason Wyatt first post op visit scheduled for this coming Monday, 17 June with Ernestine Conrad, RN. Asked them to call us back at [phone] to reschedule that appointment.

## 2017-06-28 NOTE — NC FL2 (Addendum)
[place] LEVEL OF CARE SCREENING TOOL     IDENTIFICATION  Patient Name: Jason Wyatt Birthdate: September 13, 1955 Sex: male Admission Date (Current Location): 06/27/2017  [facility] and [place] Number:  Herbalist and Address:  [facility],  [place] [address], [place]      Provider Number: [id number]  Attending Physician Name and Address:  Debbe Odea, MD  Relative Name and Phone Number:       Current Level of Care: Hospital Recommended Level of Care: [place] Prior Approval Number:    Date Approved/Denied:   PASRR Number:   [id number]  Discharge Plan: SNF    Current Diagnoses: Patient Active Problem List   Diagnosis Date Noted  . Gait instability 06/27/2017  . Rupture Achilles tendon, left, initial encounter   . Closed displaced avulsion fracture of tuberosity of left calcaneus   . Post-operative state   . Type 2 diabetes mellitus with hyperlipidemia ([place]) 11/22/2016  . Primary osteoarthritis of left knee 08/05/2015  . Normal coronary arteries 07/08/2015  . DM type 2 with diabetic peripheral neuropathy ([place]) 10/07/2014  . RLS 07/26/2014  . Medication management 07/26/2014  . Hyperlipidemia, mixed 08/24/2011  . Hypertension 08/24/2011  . T2_NIDDM w/ CKD 2 (GFR 78 ml/min) 08/05/2011  . Gout 08/05/2011  . Morbid obesity (BMI 41+) 08/04/2011  . OSA on CPAP 08/04/2011  . CKD (chronic kidney disease), stage II 08/03/2011  . Atherosclerotic peripheral vascular disease ([place]) 06/26/2011    Orientation RESPIRATION BLADDER Height & Weight     Self, Time, Situation, Place  Normal Continent Weight: 246 lb (111.6 kg) Height:  6\' 2"  (188 cm)  BEHAVIORAL SYMPTOMS/MOOD NEUROLOGICAL BOWEL NUTRITION STATUS      Continent Diet(Carb Modified. )  AMBULATORY STATUS COMMUNICATION OF NEEDS Skin   Extensive Assist Verbally Surgical wounds(Left Ankle)  Wound Vac placed.                       Personal Care Assistance  Level of Assistance  Bathing, Feeding, Dressing Bathing Assistance: Limited assistance Feeding assistance: Independent Dressing Assistance: Limited assistance     Functional Limitations Info  Sight, Hearing, Speech Sight Info: Impaired(Wears glasses) Hearing Info: Adequate Speech Info: Adequate    SPECIAL CARE FACTORS FREQUENCY  PT (By licensed PT), OT (By licensed OT)     PT Frequency: 5x/week OT Frequency: 5x/week            Contractures Contractures Info: Not present    Additional Factors Info  Code Status, Allergies Code Status Info: Fullcode Allergies Info: Allergies: Flaxseed Linseed Oil, Lyrica Pregabalin, Niaspan Niacin Er           Current Medications (06/28/2017):  This is the current hospital active medication list Current Facility-Administered Medications  Medication Dose Route Frequency Provider Last Rate Last Dose  . acetaminophen (TYLENOL) tablet 650 mg  650 mg Oral Q6H PRN Debbe Odea, MD       Or  . acetaminophen (TYLENOL) suppository 650 mg  650 mg Rectal Q6H PRN Debbe Odea, MD      . allopurinol (ZYLOPRIM) tablet 300 mg  300 mg Oral QHS Debbe Odea, MD   300 mg at 06/27/17 2200  . carbidopa-levodopa (SINEMET IR) 25-250 MG per tablet immediate release 1 tablet  1 tablet Oral BID Debbe Odea, MD   1 tablet at 06/28/17 1037  . DULoxetine (CYMBALTA) DR capsule 60 mg  60 mg Oral QHS Debbe Odea, MD   60  mg at 06/27/17 2204  . enalapril (VASOTEC) tablet 20 mg  20 mg Oral Daily Debbe Odea, MD   20 mg at 06/28/17 1037  . enoxaparin (LOVENOX) injection 40 mg  40 mg Subcutaneous Q24H Debbe Odea, MD   40 mg at 06/28/17 0630  . gabapentin (NEURONTIN) capsule 600 mg  600 mg Oral TID Debbe Odea, MD   600 mg at 06/28/17 1037  . insulin aspart (novoLOG) injection 0-15 Units  0-15 Units Subcutaneous TID WC Debbe Odea, MD   3 Units at 06/28/17 1229  . insulin aspart (novoLOG) injection 0-5 Units  0-5 Units Subcutaneous QHS Rizwan, Saima, MD       . linagliptin (TRADJENTA) tablet 5 mg  5 mg Oral Daily Debbe Odea, MD   5 mg at 06/28/17 1037  . magnesium oxide (MAG-OX) tablet 400 mg  400 mg Oral QPM Debbe Odea, MD   400 mg at 06/27/17 2204  . meloxicam (MOBIC) tablet 15 mg  15 mg Oral Daily PRN Debbe Odea, MD      . omega-3 acid ethyl esters (LOVAZA) capsule 1 g  1 g Oral BID Debbe Odea, MD   1 g at 06/28/17 1037  . ondansetron (ZOFRAN) tablet 4 mg  4 mg Oral Q6H PRN Debbe Odea, MD       Or  . ondansetron (ZOFRAN) injection 4 mg  4 mg Intravenous Q6H PRN Rizwan, Saima, MD      . pantoprazole (PROTONIX) EC tablet 40 mg  40 mg Oral Daily Debbe Odea, MD   40 mg at 06/28/17 1037  . tamsulosin (FLOMAX) capsule 0.4 mg  0.4 mg Oral QPC supper Debbe Odea, MD   0.4 mg at 06/27/17 2204     Discharge Medications: Please see discharge summary for a list of discharge medications.  Relevant Imaging Results:  Relevant Lab Results:   Additional Information ssn:[id number]  Lia Hopping, LCSW

## 2017-06-28 NOTE — Clinical Social Work Placement (Signed)
   CLINICAL SOCIAL WORK PLACEMENT  NOTE  Date:  06/28/2017  Patient Details  Name: Jason Wyatt MRN: [id number] Date of Birth: April 27, 1955  Clinical Social Work is seeking post-discharge placement for this patient at the [place] level of care (*CSW will initial, date and re-position this form in  chart as items are completed):  Yes   Patient/family provided with [place] Work Department's list of facilities offering this level of care within the geographic area requested by the patient (or if unable, by the patient's family).  Yes   Patient/family informed of their freedom to choose among providers that offer the needed level of care, that participate in Medicare, Medicaid or managed care program needed by the patient, have an available bed and are willing to accept the patient.  Yes   Patient/family informed of [place]'s ownership interest in [facility] and [facility], as well as of the fact that they are under no obligation to receive care at these facilities.  PASRR submitted to EDS on 06/28/17     PASRR number received on 06/28/17     Existing PASRR number confirmed on       FL2 transmitted to all facilities in geographic area requested by pt/family on       FL2 transmitted to all facilities within larger geographic area on       Patient informed that his/her managed care company has contracts with or will negotiate with certain facilities, including the following:  [place]     Yes   Patient/family informed of bed offers received.  Patient chooses bed at [facility]     Physician recommends and patient chooses bed at      Patient to be transferred to [facility] on  .  Patient to be transferred to facility by       Patient family notified on   of transfer.  Name of family member notified:        PHYSICIAN       Additional Comment:     _______________________________________________ Lia Hopping, LCSW 06/28/2017, 3:52 PM

## 2017-06-28 NOTE — Progress Notes (Signed)
Subjective: Patient seen at bedside. No pain resting comfortably.  Objective:  Dressing c/d/i vac on and functioning, canister empty.  Assessment Plan: S/p L ankle Achilles repair with flexor tendon transfer   Continue PT. NWB LLE. No PROM or AROM ankle at this time.  Plan for d/c to SNF. Appreciate social work assistance in [place] representative. Keep dressing on. Connect wound vac to [place] for discharge. Lovenox as dvt ppx   Please contact with questions or concerns  Dr. Hardie Pulley DPM [phone]

## 2017-06-28 NOTE — Progress Notes (Signed)
Physical Therapy Treatment Patient Details Name: Jason Wyatt MRN: [id number] DOB: 01/08/1956 Today's Date: 06/28/2017    History of Present Illness Pt s/p L achilles tendon repair and with hx of L TKR, DM, back surgery and RBBB    PT Comments    Pt motivated but mobility limited by premorbid deconditioning, obesity and NWB status on L.  Follow Up Recommendations  SNF     Equipment Recommendations  None recommended by PT    Recommendations for Other Services       Precautions / Restrictions Precautions Precautions: Fall Restrictions Weight Bearing Restrictions: Yes LLE Weight Bearing: Non weight bearing    Mobility  Bed Mobility Overal bed mobility: Needs Assistance Bed Mobility: Sit to Supine           General bed mobility comments: Increased time with cues and min guard assist to avoid WB L LE  Transfers Overall transfer level: Needs assistance Equipment used: Rolling walker (2 wheeled) Transfers: Sit to/from Stand Sit to Stand: Min assist;+2 safety/equipment;From elevated surface         General transfer comment: cues for LE management, NWB on L LE and use of UEs to self assist  Ambulation/Gait Ambulation/Gait assistance: Min assist;+2 physical assistance;+2 safety/equipment Gait Distance (Feet): 25 Feet Assistive device: Rolling walker (2 wheeled) Gait Pattern/deviations: Step-to pattern;Decreased step length - right;Decreased step length - left;Shuffle Gait velocity: decr   General Gait Details: Increased time with multiple standing rest breaks and cues for posture, position from RW and sequence.  Pt ltd by UE fatigue but able to comply with NWB throughout.   Stairs             Wheelchair Mobility    Modified Rankin (Stroke Patients Only)       Balance Overall balance assessment: Needs assistance Sitting-balance support: Feet supported;No upper extremity supported Sitting balance-Leahy Scale: Good     Standing balance support:  Bilateral upper extremity supported Standing balance-Leahy Scale: Poor                              Cognition Arousal/Alertness: Awake/alert Behavior During Therapy: WFL for tasks assessed/performed;Impulsive Overall Cognitive Status: Within Functional Limits for tasks assessed                                        Exercises      General Comments        Pertinent Vitals/Pain Pain Assessment: 0-10 Pain Score: 3  Pain Location: L ankle and L thigh Pain Descriptors / Indicators: Sore Pain Intervention(s): Limited activity within patient's tolerance;Monitored during session;Premedicated before session;Ice applied    Home Living                      Prior Function            PT Goals (current goals can now be found in the care plan section) Acute Rehab PT Goals Patient Stated Goal: Regain IND PT Goal Formulation: With patient Time For Goal Achievement: 07/05/17 Potential to Achieve Goals: Good Progress towards PT goals: Progressing toward goals    Frequency    Min 5X/week      PT Plan Current plan remains appropriate    Co-evaluation              AM-PAC PT "6 Clicks" Daily Activity  Outcome  Measure  Difficulty turning over in bed (including adjusting bedclothes, sheets and blankets)?: A Lot Difficulty moving from lying on back to sitting on the side of the bed? : A Lot Difficulty sitting down on and standing up from a chair with arms (e.g., wheelchair, bedside commode, etc,.)?: Unable Help needed moving to and from a bed to chair (including a wheelchair)?: A Little Help needed walking in hospital room?: A Little Help needed climbing 3-5 steps with a railing? : Total 6 Click Score: 12    End of Session Equipment Utilized During Treatment: Gait belt Activity Tolerance: Patient tolerated treatment well;Patient limited by fatigue Patient left: with call bell/phone within reach;with family/visitor present;in bed Nurse  Communication: Mobility status PT Visit Diagnosis: Difficulty in walking, not elsewhere classified (R26.2)     Time: 5027-7412 PT Time Calculation (min) (ACUTE ONLY): 22 min  Charges:  $Gait Training: 8-22 mins                    G Codes:       IN [phone]    Cadden Elizondo 06/28/2017, 5:22 PM

## 2017-06-28 NOTE — Progress Notes (Signed)
Wound vac was alarming, several nurses looked at it and attempted to fix it. Prevena assist was called and through a few trial and errors found it was a leak from the incision not the machine.  Dr. March Rummage called as his nurse asked [place] to change out the wound vac machine, the office was called again and explained the issue was not the machine but within the dressing on incision. Dr. March Rummage gave orders to remove dressing and apply hospital wound vac. This was done and there has been no issues with it since.

## 2017-06-28 NOTE — Clinical Social Work Note (Addendum)
Clinical Social Work Assessment  Patient Details  Name: Jason Wyatt MRN: [id number] Date of Birth: 1955-05-01  Date of referral:  06/28/17               Reason for consult:  Facility Placement                Permission sought to share information with:  Facility Sport and exercise psychologist, Family Supports Permission granted to share information::  Yes, Verbal Permission Granted  Name::        Agency::  SNF  Relationship:: Son  Contact Information:     Housing/Transportation Living arrangements for the past 2 months:  [place] of Information:  Patient Patient Interpreter Needed:  None Criminal Activity/Legal Involvement Pertinent to Current Situation/Hospitalization:  No - Comment as needed Significant Relationships:  Adult Children, Spouse Lives with:  Spouse, Adult Children Do you feel safe going back to the place where you live?  Yes Need for family participation in patient care: Yes  Care giving concerns:   Here for surgical consult of left Achilles tendon rupture left ankle.  Procedure completed: Gastroc Recession Extremity Tendon Transfer.   PT recommends SNF for short rehab.  Social Worker assessment / plan:  CSW met with the patient at bedside, explain role and reason for visit-to assist with discharge to SNF.  Patient very receptive to [place] visit. He reports he will complete physical therapy at SNF and then return home with his spouse. Patient reports he is fairly independent at home. He was walking with a can and boot on his foot. He reports he used a walker with physical therapy and tried not to bear weight on his ankle. Patient reports he is not familiar with the SNF process. Patient reports the plan is for [facility].  CSW informed patient of SNF process and later following up with bed offers.   CSW called facility and inform them to initiate authorization.   FL2 done.   Plan: SNF-[facility]  Employment status:  Retired Designer, industrial/product PT Recommendations:  [place] / Referral to community resources:  [place]  Patient/Family's Response to care:  Agreeable and Responding well to care.   Patient/Family's Understanding of and Emotional Response to Diagnosis, Current Treatment, and Prognosis: Patient has a good understanding of his diagnosis and treatment plan. Patient reports his sons are also involved in his care.   Emotional Assessment Appearance:  Appears stated age Attitude/Demeanor/Rapport:    Affect (typically observed):  Accepting, Calm Orientation:  Oriented to Self, Oriented to Place, Oriented to  Time, Oriented to Situation Alcohol / Substance use:  Not Applicable Psych involvement (Current and /or in the community):  No (Comment)  Discharge Needs  Concerns to be addressed:  Discharge Planning Concerns Readmission within the last 30 days:  No Current discharge risk:  Dependent with Mobility Barriers to Discharge:  Continued Medical Work up   [employer], LCSW 06/28/2017, 3:36 PM

## 2017-06-28 NOTE — Evaluation (Signed)
Physical Therapy Evaluation Patient Details Name: Jason Wyatt MRN: [id number] DOB: 04-Dec-1955 Today's Date: 06/28/2017   History of Present Illness  Pt s/p L achilles tendon repair and with hx of L TKR, DM, back surgery and RBBB  Clinical Impression  Pt admitted as above and presenting with functional mobility limited by post op pain, decreased L LE strength/ROM, balance deficits and NWB status on L LE.  Pt would benefit from follow up rehab at SNF level to maximize IND and safety prior to return home with ltd assist.    Follow Up Recommendations SNF    Equipment Recommendations  None recommended by PT    Recommendations for Other Services       Precautions / Restrictions Precautions Precautions: Fall Restrictions Weight Bearing Restrictions: Yes LLE Weight Bearing: Non weight bearing      Mobility  Bed Mobility Overal bed mobility: Needs Assistance Bed Mobility: Supine to Sit           General bed mobility comments: Increased time with cues and min guard assist to avoid WB L LE  Transfers Overall transfer level: Needs assistance   Transfers: Sit to/from Stand Sit to Stand: Min assist;+2 safety/equipment;From elevated surface         General transfer comment: cues for LE management, NWB on L LE and use of UEs to self assist  Ambulation/Gait Ambulation/Gait assistance: Min assist;+2 physical assistance;+2 safety/equipment Gait Distance (Feet): 12 Feet Assistive device: Rolling walker (2 wheeled) Gait Pattern/deviations: Step-to pattern;Decreased step length - right;Decreased step length - left;Shuffle Gait velocity: decr   General Gait Details: cues for posture, position from RW and sequence.  Pt ltd by UE fatigue  Stairs            Wheelchair Mobility    Modified Rankin (Stroke Patients Only)       Balance Overall balance assessment: Needs assistance Sitting-balance support: Feet supported;No upper extremity supported Sitting balance-Leahy  Scale: Good     Standing balance support: Bilateral upper extremity supported Standing balance-Leahy Scale: Poor                               Pertinent Vitals/Pain Pain Assessment: 0-10 Pain Score: 3  Pain Location: L ankle and L thigh Pain Descriptors / Indicators: Sore Pain Intervention(s): Limited activity within patient's tolerance;Monitored during session    Home Living Family/patient expects to be discharged to:: Skilled nursing facility                      Prior Function Level of Independence: Independent               Hand Dominance        Extremity/Trunk Assessment   Upper Extremity Assessment Upper Extremity Assessment: Overall WFL for tasks assessed    Lower Extremity Assessment Lower Extremity Assessment: LLE deficits/detail LLE Deficits / Details: Pt able to lift L LE to exit bed       Communication   Communication: No difficulties  Cognition Arousal/Alertness: Awake/alert Behavior During Therapy: WFL for tasks assessed/performed;Impulsive Overall Cognitive Status: Within Functional Limits for tasks assessed                                        General Comments      Exercises     Assessment/Plan    PT  Assessment Patient needs continued PT services  PT Problem List Decreased activity tolerance;Decreased balance;Decreased mobility;Decreased knowledge of use of DME;Obesity;Pain       PT Treatment Interventions DME instruction;Gait training;Functional mobility training;Therapeutic activities;Therapeutic exercise;Patient/family education    PT Goals (Current goals can be found in the Care Plan section)  Acute Rehab PT Goals Patient Stated Goal: Regain IND PT Goal Formulation: With patient Time For Goal Achievement: 07/05/17 Potential to Achieve Goals: Good    Frequency Min 5X/week   Barriers to discharge        Co-evaluation               AM-PAC PT "6 Clicks" Daily Activity   Outcome Measure Difficulty turning over in bed (including adjusting bedclothes, sheets and blankets)?: A Lot Difficulty moving from lying on back to sitting on the side of the bed? : A Lot Difficulty sitting down on and standing up from a chair with arms (e.g., wheelchair, bedside commode, etc,.)?: Unable Help needed moving to and from a bed to chair (including a wheelchair)?: A Little Help needed walking in hospital room?: A Little Help needed climbing 3-5 steps with a railing? : Total 6 Click Score: 12    End of Session Equipment Utilized During Treatment: Gait belt Activity Tolerance: Patient tolerated treatment well;Patient limited by fatigue Patient left: in chair;with call bell/phone within reach;with family/visitor present Nurse Communication: Mobility status PT Visit Diagnosis: Difficulty in walking, not elsewhere classified (R26.2)    Time: 1030-1053 PT Time Calculation (min) (ACUTE ONLY): 23 min   Charges:   PT Evaluation $PT Eval Low Complexity: 1 Low PT Treatments $Gait Training: 8-22 mins   PT G Codes:        Pg [phone]   Melanie Openshaw 06/28/2017, 12:23 PM

## 2017-06-28 NOTE — Addendum Note (Signed)
Addendum  created 06/28/17 0756 by Claudia Desanctis, CRNA   Charge Capture section accepted

## 2017-06-28 NOTE — Progress Notes (Signed)
prevena wound vac is alarming, attempted normal measures to reset, did not work. Called [facility] spoke with nurse. Orders received from Dr March Rummage. Jason Wyatt

## 2017-06-28 NOTE — Progress Notes (Signed)
PROGRESS NOTE    Jason Wyatt   IOE:[id number]  DOB: 01/05/1956  DOA: 06/27/2017 PCP: Unk Pinto, MD   Brief Narrative:  Jason Wyatt is a 62 y.o. male with medical history of diabetes mellitus,, hypertension, prostate cancer, gout, morbid obesity and gastroesophageal reflux disease.  Patient has underwent a repair of a left Achilles tendon rupture.  He was admitted on the request of Dr. Hardie Pulley (podiatry) to the hospital as he is a high fall risk and risk for rerupture of the same tendon.    Subjective: No complaints.     Assessment & Plan:   Active Problems:   Gait instability -Due to left Achilles tendon rupture and repair -Nonweightbearing left foot -PT eval>> SNF- social worker aware of bed need -Pain control with Tylenol and Mobic -DVT prophylaxis with Lovenox for total of 6 weeks per Dr. March Rummage  Diabetes mellitus type 2 with neuropathy - He takes Januvia and metformin at [place] is being replaced by Tradjenta in the hospital - cont insulin sliding scale - cont Gabapentin  Hypertension -Continue Vasotec at 20 mg daily-she takes it 20 twice daily at home  Gout -Continue allopurinol  Restless leg syndrome -Continue Sinemet twice daily  BPH -Continue Flomax daily    DVT prophylaxis: Lovenox Code Status: Full code Family Communication:  Disposition Plan: SNF Consultants:   Podiatry Procedures:    Achilles tendon repair 6/12 Antimicrobials:  Anti-infectives (From admission, onward)   Start     Dose/Rate Route Frequency Ordered Stop   06/28/17 0600  ceFAZolin (ANCEF) IVPB 2g/100 mL premix     2 g 200 mL/hr over 30 Minutes Intravenous On call to O.R. 06/27/17 1200 06/27/17 1516   06/28/17 0600  ceFAZolin (ANCEF) IVPB 2g/100 mL premix  Status:  Discontinued     2 g 200 mL/hr over 30 Minutes Intravenous On call to O.R. 06/27/17 1201 06/27/17 1207       Objective: Vitals:   06/27/17 2047 06/28/17 0517 06/28/17 0958 06/28/17  1313  BP: (!) 153/96 131/85 129/76 131/77  Pulse: 66 79 68 79  Resp: 16 17 17 14   Temp: 97.9 F (36.6 C) 97.6 F (36.4 C) 97.6 F (36.4 C) 98.1 F (36.7 C)  TempSrc: Oral Oral Oral Oral  SpO2: 98% 99% 96% 96%  Weight:      Height:        Intake/Output Summary (Last 24 hours) at 06/28/2017 1553 Last data filed at 06/28/2017 1323 Gross per 24 hour  Intake 2540 ml  Output 1255 ml  Net 1285 ml   Filed Weights   06/27/17 1237  Weight: 111.6 kg (246 lb)    Examination: General exam: Appears comfortable  HEENT: PERRLA, oral mucosa moist, no sclera icterus or thrush Respiratory system: Clear to auscultation. Respiratory effort normal. Cardiovascular system: S1 & S2 heard, RRR.   Gastrointestinal system: Abdomen soft, non-tender, nondistended. Normal bowel sound. No organomegaly Central nervous system: Alert and oriented. No focal neurological deficits. Extremities: No cyanosis, clubbing or edema- left leg in dressing - attached to wound vac Skin: No rashes or ulcers Psychiatry:  Mood & affect appropriate.     Data Reviewed: I have personally reviewed following labs and imaging studies  CBC: Recent Labs  Lab 06/26/17 1029  WBC 6.1  HGB 13.3  HCT 39.5  MCV 88.2  PLT 182   Basic Metabolic Panel: Recent Labs  Lab 06/26/17 1019  NA 139  K 4.5  CL 104  CO2 28  GLUCOSE 153*  BUN 15  CREATININE 1.19  CALCIUM 9.7   GFR: Estimated Creatinine Clearance: 83.4 mL/min (by C-G formula based on SCr of 1.19 mg/dL). Liver Function Tests: No results for input(s): AST, ALT, ALKPHOS, BILITOT, PROT, ALBUMIN in the last 168 hours. No results for input(s): LIPASE, AMYLASE in the last 168 hours. No results for input(s): AMMONIA in the last 168 hours. Coagulation Profile: No results for input(s): INR, PROTIME in the last 168 hours. Cardiac Enzymes: No results for input(s): CKTOTAL, CKMB, CKMBINDEX, TROPONINI in the last 168 hours. BNP (last 3 results) No results for input(s):  PROBNP in the last 8760 hours. HbA1C: No results for input(s): HGBA1C in the last 72 hours. CBG: Recent Labs  Lab 06/27/17 1208 06/27/17 1746 06/27/17 2044 06/28/17 0725 06/28/17 1131  GLUCAP 119* 158* 206* 182* 166*   Lipid Profile: No results for input(s): CHOL, HDL, LDLCALC, TRIG, CHOLHDL, LDLDIRECT in the last 72 hours. Thyroid Function Tests: No results for input(s): TSH, T4TOTAL, FREET4, T3FREE, THYROIDAB in the last 72 hours. Anemia Panel: No results for input(s): VITAMINB12, FOLATE, FERRITIN, TIBC, IRON, RETICCTPCT in the last 72 hours. Urine analysis:    Component Value Date/Time   COLORURINE DARK YELLOW 11/22/2016 0959   APPEARANCEUR CLEAR 11/22/2016 0959   LABSPEC 1.024 11/22/2016 0959   PHURINE 7.5 11/22/2016 0959   GLUCOSEU NEGATIVE 11/22/2016 0959   HGBUR NEGATIVE 11/22/2016 0959   BILIRUBINUR NEGATIVE 07/28/2015 0940   KETONESUR NEGATIVE 11/22/2016 0959   PROTEINUR TRACE (A) 11/22/2016 0959   UROBILINOGEN 0.2 04/07/2013 0948   NITRITE NEGATIVE 11/22/2016 0959   LEUKOCYTESUR NEGATIVE 11/22/2016 0959   Sepsis Labs: @LABRCNTIP (procalcitonin:4,lacticidven:4) )No results found for this or any previous visit (from the past 240 hour(s)).       Radiology Studies: Dg Ankle 2 Views Left  Result Date: 06/27/2017 CLINICAL DATA:  Left Achilles tendon repair. EXAM: LEFT ANKLE - 2 VIEW COMPARISON:  MRI 06/19/2017. FINDINGS: Postoperative changes from Achilles tendon repair identified. There is gas identified within the surrounding soft tissues. Large posterior and plantar heel spurs identified. Calcifications within the Achilles tendon is again noted. IMPRESSION: 1. Postoperative changes status post Achilles tendon repair. Electronically Signed   By: Kerby Moors M.D.   On: 06/27/2017 20:19      Scheduled Meds: . allopurinol  300 mg Oral QHS  . carbidopa-levodopa  1 tablet Oral BID  . DULoxetine  60 mg Oral QHS  . enalapril  20 mg Oral Daily  . enoxaparin  (LOVENOX) injection  40 mg Subcutaneous Q24H  . gabapentin  600 mg Oral TID  . insulin aspart  0-15 Units Subcutaneous TID WC  . insulin aspart  0-5 Units Subcutaneous QHS  . linagliptin  5 mg Oral Daily  . magnesium oxide  400 mg Oral QPM  . omega-3 acid ethyl esters  1 g Oral BID  . pantoprazole  40 mg Oral Daily  . tamsulosin  0.4 mg Oral QPC supper   Continuous Infusions:   LOS: 1 day    Time spent in minutes: 35    Debbe Odea, MD [facility] Pager: [email or web address] [facility] 06/28/2017, 3:53 PM

## 2017-06-28 NOTE — Care Management CC44 (Signed)
Condition Code 44 Documentation Completed  Patient Details  Name: Jason Wyatt MRN: [id number] Date of Birth: Sep 23, 1955   Condition Code 44 given:  Yes Patient signature on Condition Code 44 notice:  Yes Documentation of 2 MD's agreement:  Yes Code 44 added to claim:  Yes    Guadalupe Maple, RN 06/28/2017, 4:27 PM

## 2017-06-29 ENCOUNTER — Other Ambulatory Visit: Payer: Medicare Other

## 2017-06-29 DIAGNOSIS — R2681 Unsteadiness on feet: Secondary | ICD-10-CM | POA: Diagnosis not present

## 2017-06-29 DIAGNOSIS — Z9889 Other specified postprocedural states: Secondary | ICD-10-CM | POA: Diagnosis not present

## 2017-06-29 DIAGNOSIS — N182 Chronic kidney disease, stage 2 (mild): Secondary | ICD-10-CM | POA: Diagnosis not present

## 2017-06-29 DIAGNOSIS — M1712 Unilateral primary osteoarthritis, left knee: Secondary | ICD-10-CM | POA: Diagnosis not present

## 2017-06-29 DIAGNOSIS — R2689 Other abnormalities of gait and mobility: Secondary | ICD-10-CM | POA: Diagnosis not present

## 2017-06-29 DIAGNOSIS — G2581 Restless legs syndrome: Secondary | ICD-10-CM | POA: Diagnosis not present

## 2017-06-29 DIAGNOSIS — R262 Difficulty in walking, not elsewhere classified: Secondary | ICD-10-CM | POA: Diagnosis not present

## 2017-06-29 DIAGNOSIS — M109 Gout, unspecified: Secondary | ICD-10-CM | POA: Diagnosis not present

## 2017-06-29 DIAGNOSIS — I10 Essential (primary) hypertension: Secondary | ICD-10-CM | POA: Diagnosis not present

## 2017-06-29 DIAGNOSIS — Z7984 Long term (current) use of oral hypoglycemic drugs: Secondary | ICD-10-CM | POA: Diagnosis not present

## 2017-06-29 DIAGNOSIS — M7662 Achilles tendinitis, left leg: Secondary | ICD-10-CM | POA: Diagnosis not present

## 2017-06-29 DIAGNOSIS — N400 Enlarged prostate without lower urinary tract symptoms: Secondary | ICD-10-CM | POA: Diagnosis not present

## 2017-06-29 DIAGNOSIS — Z7401 Bed confinement status: Secondary | ICD-10-CM | POA: Diagnosis not present

## 2017-06-29 DIAGNOSIS — Z7982 Long term (current) use of aspirin: Secondary | ICD-10-CM | POA: Diagnosis not present

## 2017-06-29 DIAGNOSIS — I129 Hypertensive chronic kidney disease with stage 1 through stage 4 chronic kidney disease, or unspecified chronic kidney disease: Secondary | ICD-10-CM | POA: Diagnosis not present

## 2017-06-29 DIAGNOSIS — E1122 Type 2 diabetes mellitus with diabetic chronic kidney disease: Secondary | ICD-10-CM | POA: Diagnosis not present

## 2017-06-29 DIAGNOSIS — S86012A Strain of left Achilles tendon, initial encounter: Secondary | ICD-10-CM | POA: Diagnosis not present

## 2017-06-29 DIAGNOSIS — M6281 Muscle weakness (generalized): Secondary | ICD-10-CM | POA: Diagnosis not present

## 2017-06-29 DIAGNOSIS — K219 Gastro-esophageal reflux disease without esophagitis: Secondary | ICD-10-CM | POA: Diagnosis not present

## 2017-06-29 DIAGNOSIS — E785 Hyperlipidemia, unspecified: Secondary | ICD-10-CM | POA: Diagnosis not present

## 2017-06-29 DIAGNOSIS — E1142 Type 2 diabetes mellitus with diabetic polyneuropathy: Secondary | ICD-10-CM | POA: Diagnosis not present

## 2017-06-29 DIAGNOSIS — E1140 Type 2 diabetes mellitus with diabetic neuropathy, unspecified: Secondary | ICD-10-CM | POA: Diagnosis not present

## 2017-06-29 DIAGNOSIS — M66862 Spontaneous rupture of other tendons, left lower leg: Secondary | ICD-10-CM | POA: Diagnosis not present

## 2017-06-29 DIAGNOSIS — S92032S Displaced avulsion fracture of tuberosity of left calcaneus, sequela: Secondary | ICD-10-CM | POA: Diagnosis not present

## 2017-06-29 DIAGNOSIS — N401 Enlarged prostate with lower urinary tract symptoms: Secondary | ICD-10-CM | POA: Diagnosis not present

## 2017-06-29 DIAGNOSIS — M2550 Pain in unspecified joint: Secondary | ICD-10-CM | POA: Diagnosis not present

## 2017-06-29 DIAGNOSIS — M1000 Idiopathic gout, unspecified site: Secondary | ICD-10-CM | POA: Diagnosis not present

## 2017-06-29 DIAGNOSIS — Z79899 Other long term (current) drug therapy: Secondary | ICD-10-CM | POA: Diagnosis not present

## 2017-06-29 DIAGNOSIS — L03115 Cellulitis of right lower limb: Secondary | ICD-10-CM | POA: Diagnosis not present

## 2017-06-29 DIAGNOSIS — S86012D Strain of left Achilles tendon, subsequent encounter: Secondary | ICD-10-CM | POA: Diagnosis not present

## 2017-06-29 DIAGNOSIS — R278 Other lack of coordination: Secondary | ICD-10-CM | POA: Diagnosis not present

## 2017-06-29 LAB — GLUCOSE, CAPILLARY
Glucose-Capillary: 154 mg/dL — ABNORMAL HIGH (ref 65–99)
Glucose-Capillary: 163 mg/dL — ABNORMAL HIGH (ref 65–99)

## 2017-06-29 MED ORDER — METHOCARBAMOL 500 MG PO TABS: 500.0000 mg | ORAL_TABLET | Freq: Every day | ORAL | Status: DC | PRN

## 2017-06-29 MED ORDER — ENALAPRIL MALEATE 20 MG PO TABS: 20.0000 mg | ORAL_TABLET | Freq: Every day | ORAL | 1 refills | Status: DC

## 2017-06-29 MED ORDER — POLYETHYLENE GLYCOL 3350 17 G PO PACK: 17.0000 g | PACK | Freq: Every day | ORAL | 0 refills | Status: DC | PRN

## 2017-06-29 MED ORDER — BISACODYL 10 MG RE SUPP: 10.0000 mg | Freq: Every day | RECTAL | 0 refills | Status: DC | PRN

## 2017-06-29 MED ORDER — ENOXAPARIN SODIUM 40 MG/0.4ML ~~LOC~~ SOLN: 40.0000 mg | SUBCUTANEOUS | Status: DC

## 2017-06-29 MED ORDER — BISACODYL 10 MG RE SUPP: 10.0000 mg | Freq: Every day | RECTAL | Status: DC | PRN

## 2017-06-29 NOTE — Plan of Care (Signed)
Reviewed plan of care, specifically pain control measures, safety precautions, IS use, and importance of notifying staff with any questions or concerns. Pt attentive and verbalized understanding of all education.

## 2017-06-29 NOTE — Discharge Summary (Signed)
Physician Discharge Summary  Jason Wyatt HTD:[id number] DOB: 1955/06/10 DOA: 06/27/2017  PCP: Unk Pinto, MD  Admit date: 06/27/2017 Discharge date: 06/29/2017  Admitted From: home Disposition:  SNF   Recommendations for Outpatient Follow-up:  1. F/u with Dr March Rummage, podiatry    Discharge Condition:  stable   CODE STATUS:  Full code   Diet recommendation:  Diabetic, heart healthy, low sodium Consultations:  Dr March Rummage    Discharge Diagnoses:  Principal Problem:   Rupture Achilles tendon, left, initial encounter Active Problems:   Gout   Hypertension   RLS   DM type 2 with diabetic peripheral neuropathy ([place])   Type 2 diabetes mellitus with hyperlipidemia ([place])   Gait instability   Closed displaced avulsion fracture of tuberosity of left calcaneus   Post-operative state    Subjective: No complaints.   Brief Summary: Jason Wyatt is a 62 y.o.malewith medical history ofdiabetes mellitus,, hypertension, prostate cancer, gout, morbid obesity and gastroesophageal reflux disease. Patient has underwent a repair of a left Achilles tendon rupture. He was admitted on the request of Dr. Hardie Pulley (podiatry) to the hospital as he is a high fall risk and risk for rerupture of the same tendon.    Hospital Course:  Active Problems: Gait instability -Due to left Achilles tendon rupture and repair -Nonweightbearing left foot -PT eval>> SN  -Pain control with Tylenol and Mobic -DVT prophylaxis with Lovenox for total of 6 weeks per Dr. March Rummage  Diabetes mellitus type 2with neuropathy - He takes Januvia and metformin at [place] is being replaced by Tradjenta in the hospital - cont insulin sliding scale - cont Gabapentin  Hypertension -Continue Vasotec at 20 mg daily- he takes it 20 twice daily at home  Gout -Continue allopurinol  Restless leg syndrome -Continue Sinemet twice daily  BPH -Continue Flomax daily    Discharge Exam: Vitals:    06/29/17 0841 06/29/17 0845  BP: (!) 158/86 (!) 158/86  Pulse:  73  Resp:  18  Temp:  97.8 F (36.6 C)  SpO2:  95%   Vitals:   06/28/17 2234 06/29/17 0606 06/29/17 0841 06/29/17 0845  BP: 138/76 139/81 (!) 158/86 (!) 158/86  Pulse: 79 77  73  Resp: '16 16  18  ' Temp: 98 F (36.7 C) 97.6 F (36.4 C)  97.8 F (36.6 C)  TempSrc: Oral Oral  Oral  SpO2: 95% 94%  95%  Weight:      Height:        General: Pt is alert, awake, not in acute distress Cardiovascular: RRR, S1/S2 +, no rubs, no gallops Respiratory: CTA bilaterally, no wheezing, no rhonchi Abdominal: Soft, NT, ND, bowel sounds + Extremities: no edema, no cyanosis   Discharge Instructions  Discharge Instructions    Increase activity slowly   Complete by:  As directed      Allergies as of 06/29/2017      Reactions   Flaxseed [linseed Oil] Rash   Lyrica [pregabalin] Itching, Rash   Niaspan [niacin Er] Rash      Medication List    STOP taking these medications   ACCU-CHEK SOFTCLIX LANCETS lancets   aspirin EC 81 MG tablet   blood glucose meter kit and supplies Kit   colchicine 0.6 MG tablet   doxycycline 100 MG capsule Commonly known as:  VIBRAMYCIN   predniSONE 20 MG tablet Commonly known as:  DELTASONE     TAKE these medications   acetaminophen 500 MG tablet Commonly known as:  TYLENOL Take 500  mg by mouth every 6 (six) hours as needed for moderate pain.   acyclovir 400 MG tablet Commonly known as:  ZOVIRAX TAKE 1 TABLET BY MOUTH  DAILY AS NEEDED FOR COLD  SORES   allopurinol 300 MG tablet Commonly known as:  ZYLOPRIM TAKE 1 TABLET BY MOUTH AT  BEDTIME   bisacodyl 10 MG suppository Commonly known as:  DULCOLAX Place 1 suppository (10 mg total) rectally daily as needed for moderate constipation.   carbidopa-levodopa 25-250 MG tablet Commonly known as:  SINEMET IR TAKE 1 TABLET BY MOUTH TWO  TIMES DAILY FOR RESTLESS  LEGS   clobetasol cream 0.05 % Commonly known as:  TEMOVATE Apply 1  application topically 2 (two) times daily.   DULoxetine 60 MG capsule Commonly known as:  CYMBALTA TAKE 1 CAPSULE BY MOUTH AT  BEDTIME   enalapril 20 MG tablet Commonly known as:  VASOTEC Take 1 tablet (20 mg total) by mouth daily. What changed:  when to take this   enoxaparin 40 MG/0.4ML injection Commonly known as:  LOVENOX Inject 0.4 mLs (40 mg total) into the skin daily. Start taking on:  06/30/2017   fexofenadine 180 MG tablet Commonly known as:  ALLEGRA Take 180 mg by mouth daily as needed for allergies.   Fish Oil 1200 MG Caps Take 1,200 mg by mouth 2 (two) times daily.   gabapentin 300 MG capsule Commonly known as:  NEURONTIN TAKE TWO CAPSULES BY MOUTH  THREE TIMES DAILY.   glucose blood test strip 1 each by Other route as needed for other. Use as instructed   glucose blood test strip Commonly known as:  ONE TOUCH ULTRA TEST CHECK BLOOD SUGAR 1 TIME  DAILY   JANUVIA 100 MG tablet Generic drug:  sitaGLIPtin TAKE 1 TABLET BY MOUTH  DAILY   Magnesium 500 MG Caps Take 500 mg by mouth every evening.   meloxicam 15 MG tablet Commonly known as:  MOBIC Take 15 mg by mouth daily as needed for pain.   metFORMIN 1000 MG tablet Commonly known as:  GLUCOPHAGE TAKE 1 TABLET BY MOUTH  TWICE A DAY WITH MEALS   methocarbamol 500 MG tablet Commonly known as:  ROBAXIN Take 1 tablet (500 mg total) by mouth daily as needed for muscle spasms.   multivitamin tablet Take 1 tablet by mouth daily.   omega-3 acid ethyl esters 1 g capsule Commonly known as:  LOVAZA TAKE 2 BY MOUTH TWICE DAILY   pantoprazole 40 MG tablet Commonly known as:  PROTONIX TAKE 1 TABLET BY MOUTH  DAILY   polyethylene glycol packet Commonly known as:  MIRALAX / GLYCOLAX Take 17 g by mouth daily as needed for mild constipation.   tamsulosin 0.4 MG Caps capsule Commonly known as:  FLOMAX Take 1 capsule (0.4 mg total) daily after supper by mouth.   Vitamin D (Ergocalciferol) 50000 units Caps  capsule Commonly known as:  DRISDOL Take 1 capsule (50,000 Units total) by mouth as directed. Tuesday , Wednesday, Thursday, Sunday What changed:    when to take this  additional instructions       Allergies  Allergen Reactions  . Flaxseed [Linseed Oil] Rash  . Lyrica [Pregabalin] Itching and Rash  . Niaspan [Niacin Er] Rash     Procedures/Studies: Ruptured Achilles tendon repair   Dg Ankle 2 Views Left  Result Date: 06/27/2017 CLINICAL DATA:  Left Achilles tendon repair. EXAM: LEFT ANKLE - 2 VIEW COMPARISON:  MRI 06/19/2017. FINDINGS: Postoperative changes from Achilles tendon repair identified.  There is gas identified within the surrounding soft tissues. Large posterior and plantar heel spurs identified. Calcifications within the Achilles tendon is again noted. IMPRESSION: 1. Postoperative changes status post Achilles tendon repair. Electronically Signed   By: Kerby Moors M.D.   On: 06/27/2017 20:19   Mr Ankle Left Wo Contrast  Result Date: 06/19/2017 CLINICAL DATA:  Ankle pain.  Achilles tendon rupture. EXAM: MRI OF THE LEFT ANKLE WITHOUT CONTRAST TECHNIQUE: Multiplanar, multisequence MR imaging of the ankle was performed. No intravenous contrast was administered. COMPARISON:  Radiographs dated 05/28/2017 and 12/18/2013 FINDINGS: TENDONS Peroneal: There is a longitudinal split tear of the peroneus brevis tendon from the level of the tip of the lateral malleolus extending distally. Peroneus longus tendon is intact. Small amount of fluid in the distal peroneus longus tendon sheath. Posteromedial: Intact. Small amount of fluid in the sheath of the posterior tibialis tendon above the medial malleolus. Fluid in the sheath of the flexor hallucis longus tendon likely related to the small ankle joint effusion. The fluid in the tendon sheath does not extend beyond the knot of Henry. Anterior: Normal. Achilles: Complete rupture and proximal retraction of the distal Achilles tendon. The gap in  the tendon is proximally 7 cm. The retracted tendon is severely degenerated with hypertrophy and multiple ossifications in the distal tendon. There is a chronic Haglund deformity with prominent ossifications in the distal stump of the Achilles tendon. Plantar Fascia: Hypertrophy and chronic degenerative changes of proximal portion of the plantar fascia without edema to suggest acute inflammation. LIGAMENTS Lateral: Intact. Medial: Intact. CARTILAGE Ankle Joint: Small ankle effusion. Focal subcortical edema deep to the articular surface of the lateral malleolus. This is consistent with overlying cartilage loss. Subtalar Joints/Sinus Tarsi: Small subtalar joint effusion. No discrete chondral defects. Bones: Patchy edema in the distal talus. Slight dorsal spurring at the talonavicular joint. Slight edema in the anterior margin of the distal tibia. Other: None IMPRESSION: 1. Complete rupture of the Achilles tendon with a 7 cm gap in the severely degenerated tendon. 2. Patchy edema in the distal talus and anterior aspect of the distal tibia which may be a stress or impingement phenomenon. 3. Longitudinal split tear of the peroneus brevis tendon. Electronically Signed   By: Lorriane Shire M.D.   On: 06/19/2017 10:39     The results of significant diagnostics from this hospitalization (including imaging, microbiology, ancillary and laboratory) are listed below for reference.     Microbiology: No results found for this or any previous visit (from the past 240 hour(s)).   Labs: BNP (last 3 results) No results for input(s): BNP in the last 8760 hours. Basic Metabolic Panel: Recent Labs  Lab 06/26/17 1019  NA 139  K 4.5  CL 104  CO2 28  GLUCOSE 153*  BUN 15  CREATININE 1.19  CALCIUM 9.7   Liver Function Tests: No results for input(s): AST, ALT, ALKPHOS, BILITOT, PROT, ALBUMIN in the last 168 hours. No results for input(s): LIPASE, AMYLASE in the last 168 hours. No results for input(s): AMMONIA in  the last 168 hours. CBC: Recent Labs  Lab 06/26/17 1029  WBC 6.1  HGB 13.3  HCT 39.5  MCV 88.2  PLT 293   Cardiac Enzymes: No results for input(s): CKTOTAL, CKMB, CKMBINDEX, TROPONINI in the last 168 hours. BNP: Invalid input(s): POCBNP CBG: Recent Labs  Lab 06/28/17 0725 06/28/17 1131 06/28/17 1641 06/28/17 2236 06/29/17 0754  GLUCAP 182* 166* 133* 172* 154*   D-Dimer No results for input(s):  DDIMER in the last 72 hours. Hgb A1c No results for input(s): HGBA1C in the last 72 hours. Lipid Profile No results for input(s): CHOL, HDL, LDLCALC, TRIG, CHOLHDL, LDLDIRECT in the last 72 hours. Thyroid function studies No results for input(s): TSH, T4TOTAL, T3FREE, THYROIDAB in the last 72 hours.  Invalid input(s): FREET3 Anemia work up No results for input(s): VITAMINB12, FOLATE, FERRITIN, TIBC, IRON, RETICCTPCT in the last 72 hours. Urinalysis    Component Value Date/Time   COLORURINE DARK YELLOW 11/22/2016 0959   APPEARANCEUR CLEAR 11/22/2016 0959   LABSPEC 1.024 11/22/2016 0959   PHURINE 7.5 11/22/2016 0959   GLUCOSEU NEGATIVE 11/22/2016 0959   HGBUR NEGATIVE 11/22/2016 0959   BILIRUBINUR NEGATIVE 07/28/2015 0940   KETONESUR NEGATIVE 11/22/2016 0959   PROTEINUR TRACE (A) 11/22/2016 0959   UROBILINOGEN 0.2 04/07/2013 0948   NITRITE NEGATIVE 11/22/2016 0959   LEUKOCYTESUR NEGATIVE 11/22/2016 0959   Sepsis Labs Invalid input(s): PROCALCITONIN,  WBC,  LACTICIDVEN Microbiology No results found for this or any previous visit (from the past 240 hour(s)).   Time coordinating discharge in minutes: 60  SIGNED:   Debbe Odea, MD  [facility] 06/29/2017, 10:59 AM Pager   If 7PM-7AM, please contact night-coverage [email or web address] Password [email or web address]

## 2017-06-29 NOTE — Progress Notes (Addendum)
Patient request a transport chair.  LCSW attempted to reach case management to follow up.   Per RNCM, patient does not qualify for a transport chair per [facility] home care. Facility will provide equipment needs.   Carolin Coy [facility] [place]

## 2017-06-29 NOTE — Progress Notes (Signed)
Bedside RN called Dr. Eleanora Neighbor office about issues with patient's incisional VAC.  Dr. March Rummage unavailable at this time; therefore, I clarified with his nurse, Estill Bamberg that the patient needs a new Prevena incisional vac, or he will not have one because he cannot leave with the hospital vac.   Estill Bamberg confirmed that the patient is coming into their office on Monday, and that it is fine to leave th VAC off of suction at this time. Will clarify with patient that [facility] can try to place the [facility] on when he arrives. If it does not work simply disconnect the tubing, and leave for MD follow up on Monday.

## 2017-06-29 NOTE — Progress Notes (Signed)
Discharge instructions reviewed with patient and family. No questions or concerns.  Report called to RN at [facility].

## 2017-06-29 NOTE — Clinical Social Work Placement (Signed)
    2:54 PM Patient has bed at [facility].  LCSW confirmed bed with facility.   LCSW faxed dc docs to facility.   Patient will transport by [facility].  Patient notified son, [name] while LCSW was in the room.   RN report #: [phone]  [facility]  CLINICAL SOCIAL WORK PLACEMENT  NOTE  Date:  06/29/2017  Patient Details  Name: Jason Wyatt MRN: [id number] Date of Birth: 02/07/1955  Clinical Social Work is seeking post-discharge placement for this patient at the [place] level of care (*CSW will initial, date and re-position this form in  chart as items are completed):  Yes   Patient/family provided with [place] Work Department's list of facilities offering this level of care within the geographic area requested by the patient (or if unable, by the patient's family).  Yes   Patient/family informed of their freedom to choose among providers that offer the needed level of care, that participate in Medicare, Medicaid or managed care program needed by the patient, have an available bed and are willing to accept the patient.  Yes   Patient/family informed of [facility]'s ownership interest in [facility] and [facility], as well as of the fact that they are under no obligation to receive care at these facilities.  PASRR submitted to EDS on 06/28/17     PASRR number received on 06/28/17     Existing PASRR number confirmed on       FL2 transmitted to all facilities in geographic area requested by pt/family on       FL2 transmitted to all facilities within larger geographic area on       Patient informed that his/her managed care company has contracts with or will negotiate with certain facilities, including the following:  [place]     Yes   Patient/family informed of bed offers received.  Patient chooses bed at [facility]     Physician recommends and patient chooses bed at      Patient to be  transferred to [facility] on 06/29/17.  Patient to be transferred to facility by EMS     Patient family notified on 06/29/17 of transfer.  Name of family member notified:  [name]     PHYSICIAN       Additional Comment:    _______________________________________________ Servando Snare, LCSW 06/29/2017, 2:54 PM

## 2017-07-02 ENCOUNTER — Other Ambulatory Visit: Payer: Medicare Other

## 2017-07-02 ENCOUNTER — Ambulatory Visit: Payer: Self-pay | Admitting: Physician Assistant

## 2017-07-02 LAB — HGB A1C W/O EAG: Hgb A1c MFr Bld: 7.6 % — ABNORMAL HIGH (ref 4.8–5.6)

## 2017-07-03 ENCOUNTER — Ambulatory Visit (INDEPENDENT_AMBULATORY_CARE_PROVIDER_SITE_OTHER): Payer: Medicare Other

## 2017-07-03 ENCOUNTER — Telehealth: Payer: Self-pay | Admitting: Podiatry

## 2017-07-03 VITALS — BP 145/84 | HR 99

## 2017-07-03 DIAGNOSIS — E1142 Type 2 diabetes mellitus with diabetic polyneuropathy: Secondary | ICD-10-CM

## 2017-07-03 DIAGNOSIS — S86012D Strain of left Achilles tendon, subsequent encounter: Secondary | ICD-10-CM | POA: Diagnosis not present

## 2017-07-03 NOTE — Telephone Encounter (Signed)
This is Jasmine, Therapist, sports with [facility]. I was calling to see which podiatrist Mr. Hopfensperger saw today so that we can put his orders into the system from you guys. If you can give me a call at your earliest convenience. My number is [phone] and just ask to speak with [address] nurse. Thank you.

## 2017-07-04 DIAGNOSIS — I10 Essential (primary) hypertension: Secondary | ICD-10-CM | POA: Diagnosis not present

## 2017-07-04 DIAGNOSIS — E785 Hyperlipidemia, unspecified: Secondary | ICD-10-CM | POA: Diagnosis not present

## 2017-07-04 DIAGNOSIS — M7662 Achilles tendinitis, left leg: Secondary | ICD-10-CM | POA: Diagnosis not present

## 2017-07-04 DIAGNOSIS — E1140 Type 2 diabetes mellitus with diabetic neuropathy, unspecified: Secondary | ICD-10-CM | POA: Diagnosis not present

## 2017-07-06 NOTE — Progress Notes (Signed)
Patient presents s/p gastrocnemius recess, Achilles tendon repair and tendon transfer DOS 6.12.19. He states that he is not having any significant pain at this time but wearing the boot is not comfortable for him.    No wound vac in place when patient arrived. Noted well healing surgical incision. No redness, erythema. All staples are intact, no gapping noted. Mild swelling WNL for post operative state. There is an area of redness on his big toe joint, but patient denies pain to area. Patient would continually dorsiflex his foot, I advised him to keep his foot in a nuetral position to avoid strain on his incision. Afebrile, denies chest pain, SHOB, fever, chills, n/v     Applied sterile gauze dressing and hard cast to below the knee, padding surgical area and big toe joint well, he stated comfort with cast.  Patient currently on Lovenox SQ injections daily. Currently in a rehabilitation facility for post operative care. He is to remain NWB to left foot and follow up next week for post opertive visit

## 2017-07-09 DIAGNOSIS — E1140 Type 2 diabetes mellitus with diabetic neuropathy, unspecified: Secondary | ICD-10-CM | POA: Diagnosis not present

## 2017-07-09 DIAGNOSIS — M66862 Spontaneous rupture of other tendons, left lower leg: Secondary | ICD-10-CM | POA: Diagnosis not present

## 2017-07-09 DIAGNOSIS — L03115 Cellulitis of right lower limb: Secondary | ICD-10-CM | POA: Diagnosis not present

## 2017-07-09 DIAGNOSIS — I10 Essential (primary) hypertension: Secondary | ICD-10-CM | POA: Diagnosis not present

## 2017-07-09 NOTE — Telephone Encounter (Signed)
Jason Wyatt request orders be faxed to [phone]. Faxed Dr. March Rummage 07/09/2017 10:18am orders.

## 2017-07-09 NOTE — Telephone Encounter (Signed)
NWB to the operative extremity. No passive or active ROM exercises to the operative extremity.

## 2017-07-12 ENCOUNTER — Ambulatory Visit (INDEPENDENT_AMBULATORY_CARE_PROVIDER_SITE_OTHER): Payer: Medicare Other | Admitting: Podiatry

## 2017-07-12 VITALS — Temp 98.5°F

## 2017-07-12 DIAGNOSIS — Z9889 Other specified postprocedural states: Secondary | ICD-10-CM | POA: Diagnosis not present

## 2017-07-12 DIAGNOSIS — S86012D Strain of left Achilles tendon, subsequent encounter: Secondary | ICD-10-CM | POA: Diagnosis not present

## 2017-07-12 MED ORDER — CIPROFLOXACIN HCL 500 MG PO TABS: 500.0000 mg | ORAL_TABLET | Freq: Two times a day (BID) | ORAL | 0 refills | Status: DC

## 2017-07-12 MED ORDER — CLINDAMYCIN HCL 300 MG PO CAPS: 300.0000 mg | ORAL_CAPSULE | Freq: Two times a day (BID) | ORAL | 0 refills | Status: DC

## 2017-07-13 ENCOUNTER — Ambulatory Visit (INDEPENDENT_AMBULATORY_CARE_PROVIDER_SITE_OTHER): Payer: Self-pay

## 2017-07-13 ENCOUNTER — Telehealth: Payer: Self-pay | Admitting: Podiatry

## 2017-07-13 DIAGNOSIS — S86012D Strain of left Achilles tendon, subsequent encounter: Secondary | ICD-10-CM

## 2017-07-13 NOTE — Progress Notes (Addendum)
Patient ID: Jason Wyatt, male   DOB: 1955-10-28, 62 y.o.   MRN: [id number]  3 month follow up and Medicare wellness  Assessment and Plan  Diagnoses and all orders for this visit:  Encounter for Medicare annual wellness exam  Essential hypertension Continue medication Monitor blood pressure at home; call if consistently over 130/80 Continue DASH diet.   Reminder to go to the ER if any CP, SOB, nausea, dizziness, severe HA, changes vision/speech, left arm numbness and tingling and jaw pain.  Atherosclerotic peripheral vascular disease (HCC) Control blood pressure, cholesterol, glucose, increase exercise.   OSA on CPAP Continue CPAP  Type 2 diabetes mellitus with hyperlipidemia [facility]) Education: Reviewed 'ABCs' of diabetes management (respective goals in parentheses):  A1C (<7), blood pressure (<130/80), and cholesterol (LDL <70) Eye Exam yearly and Dental Exam every 6 months. Dietary recommendations Physical Activity recommendations - check lipid panel  Type 2 diabetes mellitus with other diabetic kidney complication, without long-term current use of insulin [facility]) Education: Reviewed 'ABCs' of diabetes management (respective goals in parentheses):  A1C (<7), blood pressure (<130/80), and cholesterol (LDL <70) Eye Exam yearly and Dental Exam every 6 months. Dietary recommendations Physical Activity recommendations  - CMP/GFR  DM type 2 with diabetic peripheral neuropathy (HCC) Check feet at home daily, call with changes/concerns Followed closely by podiatry  Rupture of left Achilles tendon, sequela S/p repair by Dr. March Rummage  Primary osteoarthritis of left knee Followed by ortho  CKD (chronic kidney disease), stage II Increase fluids, avoid NSAIDS, monitor sugars, will monitor  RLS Continue meds, increase walking, monitor iron/B12  Morbid obesity (BMI 41+) Long discussion about weight loss, diet, and exercise Recommended diet heavy in fruits and veggies and low in  animal meats, cheeses, and dairy products, appropriate calorie intake Discussed appropriate weight for height  Follow up at next visit  Medication management CBC, CMP/GFR, magnesium  Hyperlipidemia, mixed Continue medications Continue low cholesterol diet and exercise.  Check lipid panel.   Idiopathic chronic gout without tophus, unspecified site Continue allopurinol Diet discussed Check uric acid as needed    During the course of the visit the patient was educated and counseled about appropriate screening and preventive services including:    Pneumococcal vaccine   Influenza vaccine  Td vaccine  Screening electrocardiogram  Screening mammography  Bone densitometry screening  Colorectal cancer screening  Diabetes screening  Glaucoma screening  Nutrition counseling   Advanced directives: given information/requested  Future Appointments  Date Time Provider Gar[place]  07/18/2017  8:15 AM Evelina Bucy, DPM [facility]  07/31/2017  9:00 AM [name], [name], DPM [facility]  12/04/2017  9:00 AM Vicie Mutters, PA-C [facility] None    HPI    62 y.o. male  presents for 3 month follow up with hypertension, hyperlipidemia, diabetes and vitamin D deficiency and medicare wellness visit. The patient now presents for follow up for transition from recent hospitalization & [facility] stay. He was hospitalized 6/12-6/14/2019 for repair of ruptured achilles tendon by Dr. Andree Moro and was discharged from the hospital 6/14 to SNF through 07/14/2017.  Patient presents in wheelchair, driven by his son. The day after discharge  our clinical staff contacted the patient to assure stability and schedule a follow up appointment.      The discharge summary, medications and diagnostic test results were reviewed & reconciled before meeting with the patient.   BMI is Body mass index is 40.74 kg/m., he has not been working on diet and exercise due to  recent surgery.   Wt Readings from Last 3 Encounters:  07/16/17 (!) 313 lb (142 kg)  06/27/17 246 lb (111.6 kg)  06/26/17 246 lb (111.6 kg)   His blood pressure has been controlled at home, today their BP is BP: 130/74  He does not workout. He denies chest pain, shortness of breath, dizziness.   He is not on cholesterol medication, on fish oil only, has never been on statins and denies myalgias. His cholesterol is not at goal. The cholesterol last visit was:   Lab Results  Component Value Date   CHOL 175 03/30/2017   HDL 32 (L) 03/30/2017   LDLCALC 108 (H) 03/30/2017   TRIG 248 (H) 03/30/2017   CHOLHDL 5.5 (H) 03/30/2017    He has been working on diet  for Type 2 diabetes on metformin and januvia, and denies foot ulcerations, increased appetite, nausea, paresthesia of the feet, polydipsia, polyuria, visual disturbances, vomiting and weight loss. He admits he has not been checking his sugars since his surgery. He reports his medications were administered incorrectly while in [facility] and anticipates elevated A1C. Last A1C in the office was:  Lab Results  Component Value Date   HGBA1C 7.6 (H) 06/21/2017   Patient is on Vitamin D supplement.   Lab Results  Component Value Date   VD25OH 85 03/30/2017     Patient is on allopurinol for gout and does not report a recent flare.  Lab Results  Component Value Date   LABURIC 6.5 05/28/2017     Current Medications:  Medication Sig  . acetaminophen (TYLENOL) 500 MG tablet Take 500 mg by mouth every 6 (six) hours as needed for moderate pain.  [name] acyclovir (ZOVIRAX) 400 MG tablet TAKE 1 TABLET BY MOUTH  DAILY AS NEEDED FOR COLD  SORES  . allopurinol (ZYLOPRIM) 300 MG tablet TAKE 1 TABLET BY MOUTH AT  BEDTIME  . bisacodyl (DULCOLAX) 10 MG suppository Place 1 suppository (10 mg total) rectally daily as needed for moderate constipation.  . carbidopa-levodopa (SINEMET IR) 25-250 MG tablet TAKE 1 TABLET BY MOUTH TWO  TIMES DAILY FOR RESTLESS  LEGS  . ciprofloxacin  (CIPRO) 500 MG tablet Take 1 tablet (500 mg total) by mouth 2 (two) times daily.  . clindamycin (CLEOCIN) 300 MG capsule Take 1 capsule (300 mg total) by mouth 2 (two) times daily.  . DULoxetine (CYMBALTA) 60 MG capsule TAKE 1 CAPSULE BY MOUTH AT  BEDTIME  . enalapril (VASOTEC) 20 MG tablet Take 1 tablet (20 mg total) by mouth daily.  [name] enoxaparin (LOVENOX) 40 MG/0.4ML injection Inject 0.4 mLs (40 mg total) into the skin daily.  . fexofenadine (ALLEGRA) 180 MG tablet Take 180 mg by mouth daily as needed for allergies.   [name] gabapentin (NEURONTIN) 300 MG capsule TAKE TWO CAPSULES BY MOUTH  THREE TIMES DAILY.  [name] glucose blood (ONE TOUCH ULTRA TEST) test strip CHECK BLOOD SUGAR 1 TIME  DAILY  . glucose blood test strip 1 each by Other route as needed for other. Use as instructed  . JANUVIA 100 MG tablet TAKE 1 TABLET BY MOUTH  DAILY  . Magnesium 500 MG CAPS Take 500 mg by mouth every evening.   . meloxicam (MOBIC) 15 MG tablet Take 15 mg by mouth daily as needed for pain.   . metFORMIN (GLUCOPHAGE) 1000 MG tablet TAKE 1 TABLET BY MOUTH  TWICE A DAY WITH MEALS  . methocarbamol (ROBAXIN) 500 MG tablet Take 1 tablet (500 mg total) by mouth daily as  needed for muscle spasms.  . Multiple Vitamin (MULTIVITAMIN) tablet Take 1 tablet by mouth daily.  [name] omega-3 acid ethyl esters (LOVAZA) 1 g capsule TAKE 2 BY MOUTH TWICE DAILY  . pantoprazole (PROTONIX) 40 MG tablet TAKE 1 TABLET BY MOUTH  DAILY  . tamsulosin (FLOMAX) 0.4 MG CAPS capsule Take 1 capsule (0.4 mg total) daily after supper by mouth.  . Vitamin D, Ergocalciferol, (DRISDOL) 50000 units CAPS capsule Take 1 capsule (50,000 Units total) by mouth as directed. Tuesday , Wednesday, Thursday, Sunday (Patient taking differently: Take 50,000 Units by mouth 2 (two) times a week. Wednesday and Sunday)   No current facility-administered medications on file prior to visit.    Medical History:  Past Medical History:  Diagnosis Date  . Acute meniscal tear of  left knee   . Allergic rhinitis   . Arthritis     back  . Borderline glaucoma   . Chronic back pain    stenosis  . CKD (chronic kidney disease), stage II   . Complication of anesthesia cervical fusion surgery 06/2010   "woke up next day w/ventilator on" told due to OSA  . GERD (gastroesophageal reflux disease)    takes Prevacid daily  . Gout    takes Allopurinol daily and Colchicine if needed  . History of adenomatous polyp of colon    tubular adenoma's  . History of Barrett's esophagus   . History of hiatal hernia   . Hyperlipidemia   . Hypertension   . Incomplete right bundle branch block (RBBB)   . OA (osteoarthritis)    knee  . OSA on CPAP   . Peripheral neuropathy   . Prostate cancer [facility]) urologist-  dr Alinda Money   dx 2013  s/p  radical non-nerve sparing prostatectomy 2014--  Stage pT2cNx,  PSA 3.48,  Gleason 3+3,  vol 55cc/  current PSA  0.01 (Aug 2016)  . Thyroid nodule    left side  . Type II diabetes mellitus ([place])   . Urinary frequency    takes Flomax daily  . Urine incontinence   . Wears glasses    Allergies Allergies  Allergen Reactions  . Flaxseed [Linseed Oil] Rash  . Lyrica [Pregabalin] Itching and Rash  . Niaspan [Niacin Er] Rash   Preventative Medicine Immunization History  Administered Date(s) Administered  . Influenza Inj Mdck Quad With Preservative 11/22/2016  . Influenza Split 10/17/2013, 10/29/2014  . Influenza, Seasonal, Injecte, Preservative Fre 11/29/2015  . Influenza-Unspecified 11/04/2012  . PPD Test 01/16/2006, 04/07/2013  . Pneumococcal Polysaccharide-23 04/07/2013, 11/22/2016  . Pneumococcal-Unspecified 09/17/2010  . Td 01/17/2011  . Zoster 01/16/2005   Colonoscopy 2016 - Dr. Cristina Gong - report requested EGD 2013 Echo 2013 CXR 2017  Tetanus: 2013 Influenza: 2018 Pneumonia: 2015, 2018 Prevnar 13: 2012 Shingles: 2007  Vision: Dr. Einar Gip, 12/13/2016, report verified and abstracted Dental: Dr. Coralyn Pear, 2019, goes  q30m  SURGICAL HISTORY He  has a past surgical history that includes Posterior fusion cervical spine (07-05-2010); Esophagogastroduodenoscopy (09/26/2011); Robot assisted laparoscopic radical prostatectomy (02/07/2012); wisdom teeth extracted  (1982); left and right heart catheterization with coronary angiogram (N/A, 08/25/2011); Anal fissure repair (06-05-2002); Cardiovascular stress test (01-01-2008); transthoracic echocardiogram (08-03-2011); Posterior lumbar fusion (05-18-2014); Uvulopalatopharyngoplasty (uppp)/tonsillectomy/septoplasty (1990's); Knee arthroscopy with medial menisectomy (Left, 12/31/2014); Cardiac catheterization; Back surgery; Total knee arthroplasty (Left, 08/05/2015); Gastroc recession extremity (Left, 06/27/2017); Achilles tendon surgery (Left, 06/27/2017); and Tendon transfer (Left, 06/27/2017). FAMILY HISTORY His family history includes Cancer in his father; Dementia in his mother; Hyperlipidemia in his sister;  Hypertension in his mother and sister; Osteoarthritis in his mother; Osteoporosis in his mother. SOCIAL HISTORY He  reports that he has never smoked. He has never used smokeless tobacco. He reports that he does not drink alcohol or use drugs.  MEDICARE WELLNESS OBJECTIVES: Physical activity: Current Exercise Habits: The patient does not participate in regular exercise at present, Exercise limited by: orthopedic condition(s) Cardiac risk factors: Cardiac Risk Factors include: advanced age (>36men, >75 women);male gender;sedentary lifestyle;obesity (BMI >30kg/m2);diabetes mellitus;dyslipidemia;hypertension Depression/mood screen:   Depression screen [facility] 2/9 07/16/2017  Decreased Interest 0  Down, Depressed, Hopeless 0  PHQ - 2 Score 0  Altered sleeping -  Tired, decreased energy -  Change in appetite -  Feeling bad or failure about yourself  -  Trouble concentrating -  Moving slowly or fidgety/restless -  Suicidal thoughts -  PHQ-9 Score -  Difficult doing work/chores -     ADLs:  In your present state of health, do you have any difficulty performing the following activities: 07/16/2017 06/27/2017  Hearing? N N  Vision? N N  Comment - -  Difficulty concentrating or making decisions? N N  Walking or climbing stairs? Tempie Donning  Comment Currently in wheelchair s/p surgery, no problems at baseline -  Dressing or bathing? N N  Doing errands, shopping? Y N  Comment Being driven by his son post surgery -  Preparing Food and eating ? N -  Using the Toilet? N -  In the past six months, have you accidently leaked urine? N -  Do you have problems with loss of bowel control? N -  Managing your Medications? N -  Managing your Finances? N -  Housekeeping or managing your Housekeeping? Y -  Comment Assisted by son -  Some recent data might be hidden     Cognitive Testing  Alert? Yes  Normal Appearance?Yes  Oriented to person? Yes  Place? Yes   Time? Yes  Recall of three objects?  Yes  Can perform simple calculations? Yes  Displays appropriate judgment?Yes  Can read the correct time from a watch face?Yes  EOL planning: Does Patient Have a Medical Advance Directive?: No Would patient like information on creating a medical advance directive?: No - Patient declined   Review of Systems:  Review of Systems  Constitutional: Negative for chills, diaphoresis, fever, malaise/fatigue and weight loss.  HENT: Negative for congestion, ear pain, hearing loss, sore throat and tinnitus.   Eyes: Negative.  Negative for blurred vision and double vision.  Respiratory: Negative for cough, sputum production, shortness of breath and wheezing.   Cardiovascular: Negative for chest pain, palpitations, orthopnea, claudication, leg swelling and PND.  Gastrointestinal: Negative for abdominal pain, blood in stool, constipation, diarrhea, heartburn, melena, nausea and vomiting.  Genitourinary: Negative.   Musculoskeletal: Negative for falls, joint pain and myalgias.  Skin: Negative.   Negative for rash.  Neurological: Negative for dizziness, tingling, sensory change, loss of consciousness, weakness and headaches.  Endo/Heme/Allergies: Negative for polydipsia.  Psychiatric/Behavioral: Negative.  Negative for depression, memory loss, substance abuse and suicidal ideas. The patient is not nervous/anxious and does not have insomnia.   All other systems reviewed and are negative.   Physical Exam: BP 130/74   Pulse 94   Temp (!) 97.5 F (36.4 C)   Resp 18   Ht 6' 1.5" (1.867 m)   Wt (!) 313 lb (142 kg) Comment: WITH BOOT  SpO2 98%   BMI 40.74 kg/m  Wt Readings from Last 3 Encounters:  07/16/17 (!) 313 lb (142 kg)  06/27/17 246 lb (111.6 kg)  06/26/17 246 lb (111.6 kg)   General Appearance: Well nourished well developed, non-toxic appearing, in no apparent distress. Eyes: PERRLA, EOMs, conjunctiva no swelling or erythema ENT/Mouth: Ear canals clear with no erythema, swelling, or discharge.  TMs normal bilaterally, oropharynx clear, moist, with no exudate.   Neck: Supple, thyroid normal, no JVD, no cervical adenopathy.  Respiratory: Respiratory effort normal, breath sounds clear A&P, no wheeze, rhonchi or rales noted.  No retractions, no accessory muscle usage Cardio: RRR with no MRGs. No edema noted Abdomen: Soft, + BS, obese,  Non tender, no guarding, rebound, hernias, masses. Musculoskeletal: Full ROM excepting L ankle in cast; gait not assessed - in wheelchair Skin: Warm, dry without rashes, lesions, ecchymosis.  Neuro: Awake and oriented X 3, Cranial nerves intact. No cerebellar symptoms.  Psych: normal affect, Insight and Judgment appropriate.   Medicare Attestation I have personally reviewed: The patient's medical and social history Their use of alcohol, tobacco or illicit drugs Their current medications and supplements The patient's functional ability including ADLs,fall risks, home safety risks, cognitive, and hearing and visual impairment Diet and  physical activities Evidence for depression or mood disorders  The patient's weight, height, BMI, and visual acuity have been recorded in the chart.  I have made referrals, counseling, and provided education to the patient based on review of the above and I have provided the patient with a written personalized care plan for preventive services.     Izora Ribas, NP 10:32 AM [facility]

## 2017-07-13 NOTE — Telephone Encounter (Signed)
I was in yesterday and had a new cast put on by Janett Billow, Therapist, sports. She told me to call if was a little too tight. Its not really bad but on the back of my calf its a little tight and feels numb. If you need me just give me a call at [phone]. Thanks so much. Bye bye.

## 2017-07-13 NOTE — Progress Notes (Signed)
Patient is here with complaints of a cast that is too tight, causing numbness on the back of his calf.   Bi-valved cast, patient stated comfort with cast at this time. He is to follow up at regular scheduled appointment or sooner if problems arise

## 2017-07-13 NOTE — Telephone Encounter (Signed)
Pt states he just spoke with Gretta Arab, RN and she said to get in as soon as possible. I told pt we saw our last pt at 1:30pm today, to tell his rehab facility he needed to get in as urgent. Pt states understanding.

## 2017-07-15 NOTE — Progress Notes (Signed)
  Subjective:  Patient ID: Jason Wyatt, male    DOB: 1955/06/29,  MRN: [id number]  Chief Complaint  Patient presents with  . Routine [employer] 06.12.2019 Gastrocnemius Recess Lt    DOS: 06/27/17 Procedure: L Gastroc Recession.  62 y.o. male returns for post-op check. Denies N/V/F/Ch. Pain is controlled with current medications.  Reports redness to the right foot with swelling.  Objective:   General AA&O x3. Normal mood and affect.  Vascular Foot warm and well perfused.  Neurologic Gross sensation intact.  Dermatologic  periwound erythema and warmth with intact suture and staple material no signs of dehiscence.  Right dorsal foot erythema warmth .     Orthopedic: Tenderness to palpation noted about the surgical site.    Assessment & Plan:  Patient was evaluated and treated and all questions answered.  S/p left Achilles repair with flexor tendon transfer -Progressing as expected post-operatively. -Sutures: Intact. -Medications refilled: Rx Clinda and Cipro for erythema left foot and right foot -Foot redressed.  Short leg cast applied  No follow-ups on file.

## 2017-07-16 ENCOUNTER — Ambulatory Visit (INDEPENDENT_AMBULATORY_CARE_PROVIDER_SITE_OTHER): Payer: Medicare Other | Admitting: Adult Health

## 2017-07-16 ENCOUNTER — Encounter: Payer: Self-pay | Admitting: Adult Health

## 2017-07-16 VITALS — BP 130/74 | HR 94 | Temp 97.5°F | Resp 18 | Ht 73.5 in | Wt 313.0 lb

## 2017-07-16 DIAGNOSIS — I10 Essential (primary) hypertension: Secondary | ICD-10-CM

## 2017-07-16 DIAGNOSIS — E1142 Type 2 diabetes mellitus with diabetic polyneuropathy: Secondary | ICD-10-CM | POA: Diagnosis not present

## 2017-07-16 DIAGNOSIS — G4733 Obstructive sleep apnea (adult) (pediatric): Secondary | ICD-10-CM

## 2017-07-16 DIAGNOSIS — R6889 Other general symptoms and signs: Secondary | ICD-10-CM

## 2017-07-16 DIAGNOSIS — Z0000 Encounter for general adult medical examination without abnormal findings: Secondary | ICD-10-CM

## 2017-07-16 DIAGNOSIS — M1A00X0 Idiopathic chronic gout, unspecified site, without tophus (tophi): Secondary | ICD-10-CM

## 2017-07-16 DIAGNOSIS — E782 Mixed hyperlipidemia: Secondary | ICD-10-CM

## 2017-07-16 DIAGNOSIS — E1169 Type 2 diabetes mellitus with other specified complication: Secondary | ICD-10-CM

## 2017-07-16 DIAGNOSIS — Z0001 Encounter for general adult medical examination with abnormal findings: Secondary | ICD-10-CM | POA: Diagnosis not present

## 2017-07-16 DIAGNOSIS — Z79899 Other long term (current) drug therapy: Secondary | ICD-10-CM | POA: Diagnosis not present

## 2017-07-16 DIAGNOSIS — S86012S Strain of left Achilles tendon, sequela: Secondary | ICD-10-CM

## 2017-07-16 DIAGNOSIS — E1129 Type 2 diabetes mellitus with other diabetic kidney complication: Secondary | ICD-10-CM | POA: Diagnosis not present

## 2017-07-16 DIAGNOSIS — N182 Chronic kidney disease, stage 2 (mild): Secondary | ICD-10-CM

## 2017-07-16 DIAGNOSIS — Z9989 Dependence on other enabling machines and devices: Secondary | ICD-10-CM

## 2017-07-16 DIAGNOSIS — E785 Hyperlipidemia, unspecified: Secondary | ICD-10-CM

## 2017-07-16 DIAGNOSIS — M1712 Unilateral primary osteoarthritis, left knee: Secondary | ICD-10-CM

## 2017-07-16 DIAGNOSIS — I70209 Unspecified atherosclerosis of native arteries of extremities, unspecified extremity: Secondary | ICD-10-CM

## 2017-07-16 DIAGNOSIS — G2581 Restless legs syndrome: Secondary | ICD-10-CM | POA: Diagnosis not present

## 2017-07-16 NOTE — Patient Instructions (Signed)
Aim for 7+ servings of fruits and vegetables daily  80+ fluid ounces of water or unsweet tea for healthy kidneys  Limit animal fats in diet for cholesterol and heart health - choose grass fed whenever available  Aim for low stress - take time to unwind and care for your mental health  Aim for 150 min of moderate intensity exercise weekly for heart health, and weights twice weekly for bone health  Aim for 7-9 hours of sleep daily   Drink 1/2 your body weight in fluid ounces of water daily; drink a tall glass of water 30 min before meals  Don't eat until you're stuffed- listen to your stomach and eat until you are 80% full   Try eating off of a salad plate; wait 10 min after finishing before going back for seconds  Start by eating the vegetables on your plate; aim for 50% of your meals to be fruits or vegetables  Then eat your protein - lean meats (grass fed if possible), fish, beans, nuts in moderation  Eat your carbs/starch last ONLY if you still are hungry. If you can, stop before finishing it all  Avoid sugar and flour - the closer it looks to it's original form in nature, typically the better it is for you  Splurge in moderation - "assign" days when you get to splurge and have the "bad stuff" - I like to follow a 80% - 20% plan- "good" choices 80 % of the time, "bad" choices in moderation 20% of the time  Simple equation is: Calories out > calories in = weight loss - even if you eat the bad stuff, if you limit portions, you will still lose weight      When it comes to diets, agreement about the perfect plan isn't easy to find, even among the experts. Experts at the [employer] developed an idea known as the Healthy Eating Plate. Just imagine a plate divided into logical, healthy portions.  The emphasis is on diet quality:  Load up on vegetables and fruits - one-half of your plate: Aim for color and variety, and remember that potatoes don't count.  Go  for whole grains - one-quarter of your plate: Whole wheat, barley, wheat berries, quinoa, oats, brown rice, and foods made with them. If you want pasta, go with whole wheat pasta.  Protein power - one-quarter of your plate: Fish, chicken, beans, and nuts are all healthy, versatile protein sources. Limit red meat.  The diet, however, does go beyond the plate, offering a few other suggestions.  Use healthy plant oils, such as olive, canola, soy, corn, sunflower and peanut. Check the labels, and avoid partially hydrogenated oil, which have unhealthy trans fats.  If you're thirsty, drink water. Coffee and tea are good in moderation, but skip sugary drinks and limit milk and dairy products to one or two daily servings.  The type of carbohydrate in the diet is more important than the amount. Some sources of carbohydrates, such as vegetables, fruits, whole grains, and beans-are healthier than others.  Finally, stay active.   

## 2017-07-17 LAB — COMPLETE METABOLIC PANEL WITH GFR
AG Ratio: 1.7 (calc) (ref 1.0–2.5)
ALT: 34 U/L (ref 9–46)
AST: 24 U/L (ref 10–35)
Albumin: 4.1 g/dL (ref 3.6–5.1)
Alkaline phosphatase (APISO): 75 U/L (ref 40–115)
BUN: 16 mg/dL (ref 7–25)
CO2: 23 mmol/L (ref 20–32)
Calcium: 9.8 mg/dL (ref 8.6–10.3)
Chloride: 106 mmol/L (ref 98–110)
Creat: 1.05 mg/dL (ref 0.70–1.25)
GFR, Est African American: 87 mL/min/{1.73_m2} (ref >=60)
GFR, Est Non African American: 75 mL/min/{1.73_m2} (ref >=60)
Globulin: 2.4 g/dL (calc) (ref 1.9–3.7)
Glucose, Bld: 251 mg/dL — ABNORMAL HIGH (ref 65–99)
Potassium: 4.6 mmol/L (ref 3.5–5.3)
Sodium: 138 mmol/L (ref 135–146)
Total Bilirubin: 0.4 mg/dL (ref 0.2–1.2)
Total Protein: 6.5 g/dL (ref 6.1–8.1)

## 2017-07-17 LAB — CBC WITH DIFFERENTIAL/PLATELET
Basophils Absolute: 88 cells/uL (ref 0–200)
Basophils Relative: 1.3 %
Eosinophils Absolute: 129 cells/uL (ref 15–500)
Eosinophils Relative: 1.9 %
HCT: 38.2 % — ABNORMAL LOW (ref 38.5–50.0)
Hemoglobin: 12.9 g/dL — ABNORMAL LOW (ref 13.2–17.1)
Lymphs Abs: 1353 cells/uL (ref 850–3900)
MCH: 29.3 pg (ref 27.0–33.0)
MCHC: 33.8 g/dL (ref 32.0–36.0)
MCV: 86.8 fL (ref 80.0–100.0)
MPV: 9.6 fL (ref 7.5–12.5)
Monocytes Relative: 7.7 %
Neutro Abs: 4706 cells/uL (ref 1500–7800)
Neutrophils Relative %: 69.2 %
Platelets: 345 10*3/uL (ref 140–400)
RBC: 4.4 10*6/uL (ref 4.20–5.80)
RDW: 14.9 % (ref 11.0–15.0)
Total Lymphocyte: 19.9 %
WBC mixed population: 524 cells/uL (ref 200–950)
WBC: 6.8 10*3/uL (ref 3.8–10.8)

## 2017-07-17 LAB — LIPID PANEL
Cholesterol: 161 mg/dL (ref <200)
HDL: 29 mg/dL — ABNORMAL LOW (ref >40)
LDL Cholesterol (Calc): 84 mg/dL (calc)
Non-HDL Cholesterol (Calc): 132 mg/dL (calc) — ABNORMAL HIGH (ref <130)
Total CHOL/HDL Ratio: 5.6 (calc) — ABNORMAL HIGH (ref <5.0)
Triglycerides: 361 mg/dL — ABNORMAL HIGH (ref <150)

## 2017-07-17 LAB — HEMOGLOBIN A1C
Hgb A1c MFr Bld: 7.8 % of total Hgb — ABNORMAL HIGH (ref <5.7)
Mean Plasma Glucose: 177 (calc)
eAG (mmol/L): 9.8 (calc)

## 2017-07-17 LAB — TSH: TSH: 3.06 mIU/L (ref 0.40–4.50)

## 2017-07-18 ENCOUNTER — Encounter: Payer: Self-pay | Admitting: Podiatry

## 2017-07-18 ENCOUNTER — Telehealth: Payer: Self-pay | Admitting: *Deleted

## 2017-07-18 ENCOUNTER — Ambulatory Visit (INDEPENDENT_AMBULATORY_CARE_PROVIDER_SITE_OTHER): Payer: Medicare Other | Admitting: Podiatry

## 2017-07-18 ENCOUNTER — Other Ambulatory Visit: Payer: Self-pay | Admitting: Podiatry

## 2017-07-18 ENCOUNTER — Ambulatory Visit (INDEPENDENT_AMBULATORY_CARE_PROVIDER_SITE_OTHER): Payer: Medicare Other

## 2017-07-18 VITALS — BP 159/85 | HR 80 | Temp 98.7°F | Resp 15

## 2017-07-18 DIAGNOSIS — S86012D Strain of left Achilles tendon, subsequent encounter: Secondary | ICD-10-CM | POA: Diagnosis not present

## 2017-07-18 DIAGNOSIS — Z9889 Other specified postprocedural states: Secondary | ICD-10-CM

## 2017-07-18 NOTE — Telephone Encounter (Signed)
Orders to [facility] and emailed to A. Barnet Glasgow.

## 2017-07-18 NOTE — Telephone Encounter (Signed)
Jason Wyatt states pt declined the 3-in-1 commode chair, bench, shower chair due to cost and will look for one on line.

## 2017-07-18 NOTE — Telephone Encounter (Signed)
Pt states he has not received a call from [facility]. Pt request shower chair, bedside commode. [facility] states pt is not in their system, and transferred to [facility] to see if accepted pt's insurance. Coralyn Pear states they do accept Medicare and pt is in the [facility] area.

## 2017-07-18 NOTE — Telephone Encounter (Addendum)
Jason Wyatt - [facility] states send referral with PT orders to get established, until Dr. March Rummage has nursing orders. Faxed required referral form, orders for pt to be non-weight bearing to left leg and foot, PT to teach transfer to bed, commode and shower, clinicals and demographics to [facility].

## 2017-07-21 DIAGNOSIS — E559 Vitamin D deficiency, unspecified: Secondary | ICD-10-CM | POA: Diagnosis not present

## 2017-07-21 DIAGNOSIS — M48061 Spinal stenosis, lumbar region without neurogenic claudication: Secondary | ICD-10-CM | POA: Diagnosis not present

## 2017-07-21 DIAGNOSIS — I4510 Unspecified right bundle-branch block: Secondary | ICD-10-CM | POA: Diagnosis not present

## 2017-07-21 DIAGNOSIS — E785 Hyperlipidemia, unspecified: Secondary | ICD-10-CM | POA: Diagnosis not present

## 2017-07-21 DIAGNOSIS — Z7901 Long term (current) use of anticoagulants: Secondary | ICD-10-CM | POA: Diagnosis not present

## 2017-07-21 DIAGNOSIS — S86012D Strain of left Achilles tendon, subsequent encounter: Secondary | ICD-10-CM | POA: Diagnosis not present

## 2017-07-21 DIAGNOSIS — Z792 Long term (current) use of antibiotics: Secondary | ICD-10-CM | POA: Diagnosis not present

## 2017-07-21 DIAGNOSIS — M1711 Unilateral primary osteoarthritis, right knee: Secondary | ICD-10-CM | POA: Diagnosis not present

## 2017-07-21 DIAGNOSIS — N182 Chronic kidney disease, stage 2 (mild): Secondary | ICD-10-CM | POA: Diagnosis not present

## 2017-07-21 DIAGNOSIS — Z7984 Long term (current) use of oral hypoglycemic drugs: Secondary | ICD-10-CM | POA: Diagnosis not present

## 2017-07-21 DIAGNOSIS — M1A00X0 Idiopathic chronic gout, unspecified site, without tophus (tophi): Secondary | ICD-10-CM | POA: Diagnosis not present

## 2017-07-21 DIAGNOSIS — Z9181 History of falling: Secondary | ICD-10-CM | POA: Diagnosis not present

## 2017-07-21 DIAGNOSIS — E1122 Type 2 diabetes mellitus with diabetic chronic kidney disease: Secondary | ICD-10-CM | POA: Diagnosis not present

## 2017-07-21 DIAGNOSIS — E1151 Type 2 diabetes mellitus with diabetic peripheral angiopathy without gangrene: Secondary | ICD-10-CM | POA: Diagnosis not present

## 2017-07-21 DIAGNOSIS — G4733 Obstructive sleep apnea (adult) (pediatric): Secondary | ICD-10-CM | POA: Diagnosis not present

## 2017-07-21 DIAGNOSIS — E1142 Type 2 diabetes mellitus with diabetic polyneuropathy: Secondary | ICD-10-CM | POA: Diagnosis not present

## 2017-07-21 DIAGNOSIS — G2581 Restless legs syndrome: Secondary | ICD-10-CM | POA: Diagnosis not present

## 2017-07-21 DIAGNOSIS — I129 Hypertensive chronic kidney disease with stage 1 through stage 4 chronic kidney disease, or unspecified chronic kidney disease: Secondary | ICD-10-CM | POA: Diagnosis not present

## 2017-07-23 ENCOUNTER — Telehealth: Payer: Self-pay | Admitting: *Deleted

## 2017-07-23 NOTE — Telephone Encounter (Signed)
Dan Europe, PT - [facility] confirms they will assist pt in training to move from bed to other areas of the his home safely and will change the plan of care as pt post op status and orders change.

## 2017-07-26 ENCOUNTER — Telehealth: Payer: Self-pay | Admitting: Podiatry

## 2017-07-26 DIAGNOSIS — Z9181 History of falling: Secondary | ICD-10-CM | POA: Diagnosis not present

## 2017-07-26 DIAGNOSIS — Z792 Long term (current) use of antibiotics: Secondary | ICD-10-CM | POA: Diagnosis not present

## 2017-07-26 DIAGNOSIS — Z7901 Long term (current) use of anticoagulants: Secondary | ICD-10-CM | POA: Diagnosis not present

## 2017-07-26 DIAGNOSIS — M1711 Unilateral primary osteoarthritis, right knee: Secondary | ICD-10-CM | POA: Diagnosis not present

## 2017-07-26 DIAGNOSIS — E785 Hyperlipidemia, unspecified: Secondary | ICD-10-CM | POA: Diagnosis not present

## 2017-07-26 DIAGNOSIS — G2581 Restless legs syndrome: Secondary | ICD-10-CM | POA: Diagnosis not present

## 2017-07-26 DIAGNOSIS — E1151 Type 2 diabetes mellitus with diabetic peripheral angiopathy without gangrene: Secondary | ICD-10-CM | POA: Diagnosis not present

## 2017-07-26 DIAGNOSIS — E1142 Type 2 diabetes mellitus with diabetic polyneuropathy: Secondary | ICD-10-CM | POA: Diagnosis not present

## 2017-07-26 DIAGNOSIS — I129 Hypertensive chronic kidney disease with stage 1 through stage 4 chronic kidney disease, or unspecified chronic kidney disease: Secondary | ICD-10-CM | POA: Diagnosis not present

## 2017-07-26 DIAGNOSIS — I4510 Unspecified right bundle-branch block: Secondary | ICD-10-CM | POA: Diagnosis not present

## 2017-07-26 DIAGNOSIS — S86012D Strain of left Achilles tendon, subsequent encounter: Secondary | ICD-10-CM | POA: Diagnosis not present

## 2017-07-26 DIAGNOSIS — M48061 Spinal stenosis, lumbar region without neurogenic claudication: Secondary | ICD-10-CM | POA: Diagnosis not present

## 2017-07-26 DIAGNOSIS — E559 Vitamin D deficiency, unspecified: Secondary | ICD-10-CM | POA: Diagnosis not present

## 2017-07-26 DIAGNOSIS — N182 Chronic kidney disease, stage 2 (mild): Secondary | ICD-10-CM | POA: Diagnosis not present

## 2017-07-26 DIAGNOSIS — G4733 Obstructive sleep apnea (adult) (pediatric): Secondary | ICD-10-CM | POA: Diagnosis not present

## 2017-07-26 DIAGNOSIS — Z7984 Long term (current) use of oral hypoglycemic drugs: Secondary | ICD-10-CM | POA: Diagnosis not present

## 2017-07-26 DIAGNOSIS — E1122 Type 2 diabetes mellitus with diabetic chronic kidney disease: Secondary | ICD-10-CM | POA: Diagnosis not present

## 2017-07-26 DIAGNOSIS — M1A00X0 Idiopathic chronic gout, unspecified site, without tophus (tophi): Secondary | ICD-10-CM | POA: Diagnosis not present

## 2017-07-26 NOTE — Telephone Encounter (Signed)
Dan Europe, PT - [facility] states she would like OT to evaluate pt, he is unable to perform ADL, due to his post op states and size. I okayed the orders and reiterated pt is to be non-weight bearing on the surgery foot. Dan Europe states understanding.

## 2017-07-27 ENCOUNTER — Telehealth: Payer: Self-pay | Admitting: Podiatry

## 2017-07-27 ENCOUNTER — Ambulatory Visit (INDEPENDENT_AMBULATORY_CARE_PROVIDER_SITE_OTHER): Payer: Medicare Other | Admitting: Podiatry

## 2017-07-27 ENCOUNTER — Telehealth: Payer: Self-pay | Admitting: *Deleted

## 2017-07-27 DIAGNOSIS — N182 Chronic kidney disease, stage 2 (mild): Secondary | ICD-10-CM | POA: Diagnosis not present

## 2017-07-27 DIAGNOSIS — E1151 Type 2 diabetes mellitus with diabetic peripheral angiopathy without gangrene: Secondary | ICD-10-CM | POA: Diagnosis not present

## 2017-07-27 DIAGNOSIS — M48061 Spinal stenosis, lumbar region without neurogenic claudication: Secondary | ICD-10-CM | POA: Diagnosis not present

## 2017-07-27 DIAGNOSIS — G2581 Restless legs syndrome: Secondary | ICD-10-CM | POA: Diagnosis not present

## 2017-07-27 DIAGNOSIS — M1711 Unilateral primary osteoarthritis, right knee: Secondary | ICD-10-CM | POA: Diagnosis not present

## 2017-07-27 DIAGNOSIS — G4733 Obstructive sleep apnea (adult) (pediatric): Secondary | ICD-10-CM | POA: Diagnosis not present

## 2017-07-27 DIAGNOSIS — E559 Vitamin D deficiency, unspecified: Secondary | ICD-10-CM | POA: Diagnosis not present

## 2017-07-27 DIAGNOSIS — M1A00X0 Idiopathic chronic gout, unspecified site, without tophus (tophi): Secondary | ICD-10-CM | POA: Diagnosis not present

## 2017-07-27 DIAGNOSIS — Z7901 Long term (current) use of anticoagulants: Secondary | ICD-10-CM | POA: Diagnosis not present

## 2017-07-27 DIAGNOSIS — Z792 Long term (current) use of antibiotics: Secondary | ICD-10-CM | POA: Diagnosis not present

## 2017-07-27 DIAGNOSIS — I4510 Unspecified right bundle-branch block: Secondary | ICD-10-CM | POA: Diagnosis not present

## 2017-07-27 DIAGNOSIS — B351 Tinea unguium: Secondary | ICD-10-CM | POA: Diagnosis not present

## 2017-07-27 DIAGNOSIS — E1142 Type 2 diabetes mellitus with diabetic polyneuropathy: Secondary | ICD-10-CM | POA: Diagnosis not present

## 2017-07-27 DIAGNOSIS — Z9181 History of falling: Secondary | ICD-10-CM | POA: Diagnosis not present

## 2017-07-27 DIAGNOSIS — Z7984 Long term (current) use of oral hypoglycemic drugs: Secondary | ICD-10-CM | POA: Diagnosis not present

## 2017-07-27 DIAGNOSIS — I129 Hypertensive chronic kidney disease with stage 1 through stage 4 chronic kidney disease, or unspecified chronic kidney disease: Secondary | ICD-10-CM | POA: Diagnosis not present

## 2017-07-27 DIAGNOSIS — E1122 Type 2 diabetes mellitus with diabetic chronic kidney disease: Secondary | ICD-10-CM | POA: Diagnosis not present

## 2017-07-27 DIAGNOSIS — E785 Hyperlipidemia, unspecified: Secondary | ICD-10-CM | POA: Diagnosis not present

## 2017-07-27 DIAGNOSIS — S86012D Strain of left Achilles tendon, subsequent encounter: Secondary | ICD-10-CM | POA: Diagnosis not present

## 2017-07-27 MED ORDER — MEDIHONEY WOUND/BURN DRESSING EX GEL: CUTANEOUS | 5 refills | Status: DC

## 2017-07-27 NOTE — Telephone Encounter (Signed)
This is [employer] calling about a prescription we received on Jason Wyatt. It is for medihoney wound burn dressing gel. We need clarification on that medication. If you would please give [place] a call back at [phone]. Thank you.

## 2017-07-27 NOTE — Telephone Encounter (Signed)
Dr. March Rummage states he would like nursing to apply medihoney to left foot incision site and wounds to right foot every other day. Faxed orders to [facility].

## 2017-07-27 NOTE — Telephone Encounter (Signed)
[facility] pharmacist states they do not do compounds and I told her it was not a compound but premade. Pharmacist states she will have available for pt on Monday.

## 2017-07-30 ENCOUNTER — Encounter: Payer: Self-pay | Admitting: *Deleted

## 2017-07-31 ENCOUNTER — Ambulatory Visit: Payer: Medicare Other | Admitting: Podiatry

## 2017-07-31 ENCOUNTER — Other Ambulatory Visit: Payer: Self-pay | Admitting: Physician Assistant

## 2017-07-31 ENCOUNTER — Telehealth: Payer: Self-pay | Admitting: Podiatry

## 2017-07-31 DIAGNOSIS — M1A00X0 Idiopathic chronic gout, unspecified site, without tophus (tophi): Secondary | ICD-10-CM | POA: Diagnosis not present

## 2017-07-31 DIAGNOSIS — N182 Chronic kidney disease, stage 2 (mild): Secondary | ICD-10-CM | POA: Diagnosis not present

## 2017-07-31 DIAGNOSIS — E1122 Type 2 diabetes mellitus with diabetic chronic kidney disease: Secondary | ICD-10-CM | POA: Diagnosis not present

## 2017-07-31 DIAGNOSIS — M48061 Spinal stenosis, lumbar region without neurogenic claudication: Secondary | ICD-10-CM | POA: Diagnosis not present

## 2017-07-31 DIAGNOSIS — I4510 Unspecified right bundle-branch block: Secondary | ICD-10-CM | POA: Diagnosis not present

## 2017-07-31 DIAGNOSIS — E1151 Type 2 diabetes mellitus with diabetic peripheral angiopathy without gangrene: Secondary | ICD-10-CM | POA: Diagnosis not present

## 2017-07-31 DIAGNOSIS — I129 Hypertensive chronic kidney disease with stage 1 through stage 4 chronic kidney disease, or unspecified chronic kidney disease: Secondary | ICD-10-CM | POA: Diagnosis not present

## 2017-07-31 DIAGNOSIS — G2581 Restless legs syndrome: Secondary | ICD-10-CM | POA: Diagnosis not present

## 2017-07-31 DIAGNOSIS — M1711 Unilateral primary osteoarthritis, right knee: Secondary | ICD-10-CM | POA: Diagnosis not present

## 2017-07-31 DIAGNOSIS — S86012D Strain of left Achilles tendon, subsequent encounter: Secondary | ICD-10-CM | POA: Diagnosis not present

## 2017-07-31 DIAGNOSIS — E1142 Type 2 diabetes mellitus with diabetic polyneuropathy: Secondary | ICD-10-CM | POA: Diagnosis not present

## 2017-07-31 NOTE — Telephone Encounter (Signed)
Left [place] - [facility], I had faxed orders to [facility] established pt fax [phone] for wound care from Dr. March Rummage, cleanse wounds with wound cleanse, pat dry apply Medihoney to the left incision site on the foot and wounds to the right foot, cover with dry sterile dressings qod. I asked Tillie Rung to call if I needed to send referral to [facility] for pt to receive skilled nursing.

## 2017-07-31 NOTE — Telephone Encounter (Signed)
This is Jason Wyatt, PT with [facility]. I'm calling to get verbal orders on [name] for skilled nursing to come out and assist with his wounds. We received the referral for PT but we did not receive the referral for skilled nursing. We just wanted to see if we could get a nurse out to help assist him with his wounds and keep an eye on them. You can reach me at [phone]. Thanks. Bye.

## 2017-08-01 DIAGNOSIS — I129 Hypertensive chronic kidney disease with stage 1 through stage 4 chronic kidney disease, or unspecified chronic kidney disease: Secondary | ICD-10-CM | POA: Diagnosis not present

## 2017-08-01 DIAGNOSIS — I4510 Unspecified right bundle-branch block: Secondary | ICD-10-CM | POA: Diagnosis not present

## 2017-08-01 DIAGNOSIS — E1151 Type 2 diabetes mellitus with diabetic peripheral angiopathy without gangrene: Secondary | ICD-10-CM | POA: Diagnosis not present

## 2017-08-01 DIAGNOSIS — M1711 Unilateral primary osteoarthritis, right knee: Secondary | ICD-10-CM | POA: Diagnosis not present

## 2017-08-01 DIAGNOSIS — G2581 Restless legs syndrome: Secondary | ICD-10-CM | POA: Diagnosis not present

## 2017-08-01 DIAGNOSIS — E1122 Type 2 diabetes mellitus with diabetic chronic kidney disease: Secondary | ICD-10-CM | POA: Diagnosis not present

## 2017-08-01 DIAGNOSIS — M1A00X0 Idiopathic chronic gout, unspecified site, without tophus (tophi): Secondary | ICD-10-CM | POA: Diagnosis not present

## 2017-08-01 DIAGNOSIS — E1142 Type 2 diabetes mellitus with diabetic polyneuropathy: Secondary | ICD-10-CM | POA: Diagnosis not present

## 2017-08-01 DIAGNOSIS — N182 Chronic kidney disease, stage 2 (mild): Secondary | ICD-10-CM | POA: Diagnosis not present

## 2017-08-01 DIAGNOSIS — S86012D Strain of left Achilles tendon, subsequent encounter: Secondary | ICD-10-CM | POA: Diagnosis not present

## 2017-08-01 DIAGNOSIS — M48061 Spinal stenosis, lumbar region without neurogenic claudication: Secondary | ICD-10-CM | POA: Diagnosis not present

## 2017-08-02 DIAGNOSIS — M1711 Unilateral primary osteoarthritis, right knee: Secondary | ICD-10-CM | POA: Diagnosis not present

## 2017-08-02 DIAGNOSIS — G2581 Restless legs syndrome: Secondary | ICD-10-CM | POA: Diagnosis not present

## 2017-08-02 DIAGNOSIS — E1151 Type 2 diabetes mellitus with diabetic peripheral angiopathy without gangrene: Secondary | ICD-10-CM | POA: Diagnosis not present

## 2017-08-02 DIAGNOSIS — I4510 Unspecified right bundle-branch block: Secondary | ICD-10-CM | POA: Diagnosis not present

## 2017-08-02 DIAGNOSIS — N182 Chronic kidney disease, stage 2 (mild): Secondary | ICD-10-CM | POA: Diagnosis not present

## 2017-08-02 DIAGNOSIS — M1A00X0 Idiopathic chronic gout, unspecified site, without tophus (tophi): Secondary | ICD-10-CM | POA: Diagnosis not present

## 2017-08-02 DIAGNOSIS — E1142 Type 2 diabetes mellitus with diabetic polyneuropathy: Secondary | ICD-10-CM | POA: Diagnosis not present

## 2017-08-02 DIAGNOSIS — S86012D Strain of left Achilles tendon, subsequent encounter: Secondary | ICD-10-CM | POA: Diagnosis not present

## 2017-08-02 DIAGNOSIS — I129 Hypertensive chronic kidney disease with stage 1 through stage 4 chronic kidney disease, or unspecified chronic kidney disease: Secondary | ICD-10-CM | POA: Diagnosis not present

## 2017-08-02 DIAGNOSIS — M48061 Spinal stenosis, lumbar region without neurogenic claudication: Secondary | ICD-10-CM | POA: Diagnosis not present

## 2017-08-02 DIAGNOSIS — E1122 Type 2 diabetes mellitus with diabetic chronic kidney disease: Secondary | ICD-10-CM | POA: Diagnosis not present

## 2017-08-03 ENCOUNTER — Ambulatory Visit (INDEPENDENT_AMBULATORY_CARE_PROVIDER_SITE_OTHER): Payer: Medicare Other | Admitting: Podiatry

## 2017-08-03 ENCOUNTER — Encounter: Payer: Self-pay | Admitting: Podiatry

## 2017-08-03 ENCOUNTER — Telehealth: Payer: Self-pay | Admitting: *Deleted

## 2017-08-03 DIAGNOSIS — Z9889 Other specified postprocedural states: Secondary | ICD-10-CM

## 2017-08-03 DIAGNOSIS — S86012D Strain of left Achilles tendon, subsequent encounter: Secondary | ICD-10-CM

## 2017-08-03 NOTE — Telephone Encounter (Signed)
Dr. March Rummage ordered Passive PT ROM for 1 week, and then 1 week of Active ROM. Orders faxed to [facility].

## 2017-08-04 DIAGNOSIS — S86012D Strain of left Achilles tendon, subsequent encounter: Secondary | ICD-10-CM | POA: Diagnosis not present

## 2017-08-04 DIAGNOSIS — M1711 Unilateral primary osteoarthritis, right knee: Secondary | ICD-10-CM | POA: Diagnosis not present

## 2017-08-04 DIAGNOSIS — I129 Hypertensive chronic kidney disease with stage 1 through stage 4 chronic kidney disease, or unspecified chronic kidney disease: Secondary | ICD-10-CM | POA: Diagnosis not present

## 2017-08-04 DIAGNOSIS — E1142 Type 2 diabetes mellitus with diabetic polyneuropathy: Secondary | ICD-10-CM | POA: Diagnosis not present

## 2017-08-04 DIAGNOSIS — N182 Chronic kidney disease, stage 2 (mild): Secondary | ICD-10-CM | POA: Diagnosis not present

## 2017-08-04 DIAGNOSIS — M48061 Spinal stenosis, lumbar region without neurogenic claudication: Secondary | ICD-10-CM | POA: Diagnosis not present

## 2017-08-04 DIAGNOSIS — I4510 Unspecified right bundle-branch block: Secondary | ICD-10-CM | POA: Diagnosis not present

## 2017-08-04 DIAGNOSIS — M1A00X0 Idiopathic chronic gout, unspecified site, without tophus (tophi): Secondary | ICD-10-CM | POA: Diagnosis not present

## 2017-08-04 DIAGNOSIS — E1122 Type 2 diabetes mellitus with diabetic chronic kidney disease: Secondary | ICD-10-CM | POA: Diagnosis not present

## 2017-08-04 DIAGNOSIS — E1151 Type 2 diabetes mellitus with diabetic peripheral angiopathy without gangrene: Secondary | ICD-10-CM | POA: Diagnosis not present

## 2017-08-04 DIAGNOSIS — G2581 Restless legs syndrome: Secondary | ICD-10-CM | POA: Diagnosis not present

## 2017-08-05 NOTE — Progress Notes (Signed)
  Subjective:  Patient ID: Jason Wyatt, male    DOB: [birth year],  MRN: [id number]  Chief Complaint  Patient presents with  . Routine Post Op    Pt. stated," there is numbness in my leg, but no pain at all." tx: cirpo and clindamycin -Pt denies N/v/ch/f  -Pt have not went to PT yet    DOS: 06/27/17 Procedure: L Gastroc Recession.  62 y.o. male returns for post-op check. Denies N/V/F/Ch.  Denies pain.  States there is some numbness in his leg.  Using Cipro and clindamycin for redness prescribed at last visit  Objective:   General AA&O x3. Normal mood and affect.  Vascular Foot warm and well perfused.  Neurologic Gross sensation intact.  Dermatologic No signs of dehiscence.  Erythema and warmth have resolved  Orthopedic: Tenderness to palpation noted about the surgical site.    Assessment & Plan:  Patient was evaluated and treated and all questions answered.  S/p left Achilles repair with flexor tendon transfer -Progressing as expected post-operatively. -Sutures: Intact. -Medications refilled: none -Foot redressed.  No follow-ups on file.

## 2017-08-05 NOTE — Progress Notes (Signed)
  Subjective:  Patient ID: Jason Wyatt, male    DOB: 12-18-1955,  MRN: [id number]  Chief Complaint  Patient presents with  . Routine Post Op    DOS 06-27-17  Left Achilles repair with flexor tendon transfer   "Doing good"  . NOTE    New pic taken today    DOS: 06/27/17 Procedure: L Gastroc Recession.  62 y.o. male returns for post-op check. Denies N/V/F/Ch.  Doing well.  Denies pain.  Ready to start working on weightbearing  Objective:   General AA&O x3. Normal mood and affect.  Vascular Foot warm and well perfused.  Neurologic Gross sensation intact.  Dermatologic No signs of dehiscence. Incision healing well. Nails x10 elongated dystrophic   Orthopedic: Tenderness to palpation noted about the surgical site.    Assessment & Plan:  Patient was evaluated and treated and all questions answered.  S/p left Achilles repair with flexor tendon transfer -Progressing as expected post-operatively. -Sutures: Remaining staples removed -Medications refilled: none -Foot redressed.  Updated orders for home PT. -Start passive range of motion exercises x1 week.  Then active range of motion next week.  We will plan to transition weightbearing 2 weeks.  Return in about 2 weeks (around 08/17/2017) for POV - no xray, left foot.

## 2017-08-05 NOTE — Progress Notes (Signed)
  Subjective:  Patient ID: Jason Wyatt, male    DOB: 1955-07-07,  MRN: [id number]  Chief Complaint  Patient presents with  . Routine Post Op    POV dos 06.12.2019 Gastrocnemius Recess Lt *pt needs nail trim as well/routine foot care*    DOS: 06/27/17 Procedure: L Gastroc Recession.  62 y.o. male returns for post-op check. Denies N/V/F/Ch.  Denies pain.  Requesting care of his nails today.  Objective:   General AA&O x3. Normal mood and affect.  Vascular Foot warm and well perfused.  Neurologic Gross sensation intact.  Dermatologic No signs of dehiscence. Incision healing well. Nails x10 elongated dystrophic   Orthopedic: Tenderness to palpation noted about the surgical site.    Assessment & Plan:  Patient was evaluated and treated and all questions answered.  S/p left Achilles repair with flexor tendon transfer -Progressing as expected post-operatively. -Sutures: Every other staple removed. -Medications refilled: none -Foot redressed.  Diabetes with DPN, Onychomycosis -Educated on diabetic footcare. Diabetic risk level 1 -Nails x10 debrided sharply and manually with large nail nipper and rotary burr.   No follow-ups on file.

## 2017-08-06 ENCOUNTER — Telehealth: Payer: Self-pay | Admitting: Podiatry

## 2017-08-06 DIAGNOSIS — E1151 Type 2 diabetes mellitus with diabetic peripheral angiopathy without gangrene: Secondary | ICD-10-CM | POA: Diagnosis not present

## 2017-08-06 DIAGNOSIS — G2581 Restless legs syndrome: Secondary | ICD-10-CM | POA: Diagnosis not present

## 2017-08-06 DIAGNOSIS — S86012D Strain of left Achilles tendon, subsequent encounter: Secondary | ICD-10-CM | POA: Diagnosis not present

## 2017-08-06 DIAGNOSIS — M48061 Spinal stenosis, lumbar region without neurogenic claudication: Secondary | ICD-10-CM | POA: Diagnosis not present

## 2017-08-06 DIAGNOSIS — E1142 Type 2 diabetes mellitus with diabetic polyneuropathy: Secondary | ICD-10-CM | POA: Diagnosis not present

## 2017-08-06 DIAGNOSIS — M1A00X0 Idiopathic chronic gout, unspecified site, without tophus (tophi): Secondary | ICD-10-CM | POA: Diagnosis not present

## 2017-08-06 DIAGNOSIS — M1711 Unilateral primary osteoarthritis, right knee: Secondary | ICD-10-CM | POA: Diagnosis not present

## 2017-08-06 DIAGNOSIS — E1122 Type 2 diabetes mellitus with diabetic chronic kidney disease: Secondary | ICD-10-CM | POA: Diagnosis not present

## 2017-08-06 DIAGNOSIS — I129 Hypertensive chronic kidney disease with stage 1 through stage 4 chronic kidney disease, or unspecified chronic kidney disease: Secondary | ICD-10-CM | POA: Diagnosis not present

## 2017-08-06 DIAGNOSIS — N182 Chronic kidney disease, stage 2 (mild): Secondary | ICD-10-CM | POA: Diagnosis not present

## 2017-08-06 DIAGNOSIS — I4510 Unspecified right bundle-branch block: Secondary | ICD-10-CM | POA: Diagnosis not present

## 2017-08-06 NOTE — Telephone Encounter (Signed)
This is Nicolette calling from [facility] in regards to Mr. Berling. The pt said he was seen by you guys Friday 19 July and that the MD wanted to discontinue nursing. We do not have that order so when you can, please give me a call back at [phone]. Thank you. Bye bye.

## 2017-08-06 NOTE — Telephone Encounter (Signed)
I informed Nicolette - [facility] of Dr. Eleanora Neighbor orders.

## 2017-08-06 NOTE — Telephone Encounter (Signed)
D/c nursing but continue [facility] please. Thanks!

## 2017-08-07 DIAGNOSIS — M1A00X0 Idiopathic chronic gout, unspecified site, without tophus (tophi): Secondary | ICD-10-CM | POA: Diagnosis not present

## 2017-08-07 DIAGNOSIS — I129 Hypertensive chronic kidney disease with stage 1 through stage 4 chronic kidney disease, or unspecified chronic kidney disease: Secondary | ICD-10-CM | POA: Diagnosis not present

## 2017-08-07 DIAGNOSIS — M48061 Spinal stenosis, lumbar region without neurogenic claudication: Secondary | ICD-10-CM | POA: Diagnosis not present

## 2017-08-07 DIAGNOSIS — E1122 Type 2 diabetes mellitus with diabetic chronic kidney disease: Secondary | ICD-10-CM | POA: Diagnosis not present

## 2017-08-07 DIAGNOSIS — S86012D Strain of left Achilles tendon, subsequent encounter: Secondary | ICD-10-CM | POA: Diagnosis not present

## 2017-08-07 DIAGNOSIS — N182 Chronic kidney disease, stage 2 (mild): Secondary | ICD-10-CM | POA: Diagnosis not present

## 2017-08-07 DIAGNOSIS — M1711 Unilateral primary osteoarthritis, right knee: Secondary | ICD-10-CM | POA: Diagnosis not present

## 2017-08-07 DIAGNOSIS — E1151 Type 2 diabetes mellitus with diabetic peripheral angiopathy without gangrene: Secondary | ICD-10-CM | POA: Diagnosis not present

## 2017-08-07 DIAGNOSIS — G2581 Restless legs syndrome: Secondary | ICD-10-CM | POA: Diagnosis not present

## 2017-08-07 DIAGNOSIS — L989 Disorder of the skin and subcutaneous tissue, unspecified: Secondary | ICD-10-CM | POA: Diagnosis not present

## 2017-08-07 DIAGNOSIS — E1142 Type 2 diabetes mellitus with diabetic polyneuropathy: Secondary | ICD-10-CM | POA: Diagnosis not present

## 2017-08-07 DIAGNOSIS — I4510 Unspecified right bundle-branch block: Secondary | ICD-10-CM | POA: Diagnosis not present

## 2017-08-07 DIAGNOSIS — T8189XA Other complications of procedures, not elsewhere classified, initial encounter: Secondary | ICD-10-CM | POA: Diagnosis not present

## 2017-08-08 ENCOUNTER — Telehealth: Payer: Self-pay | Admitting: *Deleted

## 2017-08-08 DIAGNOSIS — M48061 Spinal stenosis, lumbar region without neurogenic claudication: Secondary | ICD-10-CM | POA: Diagnosis not present

## 2017-08-08 DIAGNOSIS — G2581 Restless legs syndrome: Secondary | ICD-10-CM | POA: Diagnosis not present

## 2017-08-08 DIAGNOSIS — M1711 Unilateral primary osteoarthritis, right knee: Secondary | ICD-10-CM | POA: Diagnosis not present

## 2017-08-08 DIAGNOSIS — M1A00X0 Idiopathic chronic gout, unspecified site, without tophus (tophi): Secondary | ICD-10-CM | POA: Diagnosis not present

## 2017-08-08 DIAGNOSIS — E1142 Type 2 diabetes mellitus with diabetic polyneuropathy: Secondary | ICD-10-CM | POA: Diagnosis not present

## 2017-08-08 DIAGNOSIS — I129 Hypertensive chronic kidney disease with stage 1 through stage 4 chronic kidney disease, or unspecified chronic kidney disease: Secondary | ICD-10-CM | POA: Diagnosis not present

## 2017-08-08 DIAGNOSIS — E1122 Type 2 diabetes mellitus with diabetic chronic kidney disease: Secondary | ICD-10-CM | POA: Diagnosis not present

## 2017-08-08 DIAGNOSIS — N182 Chronic kidney disease, stage 2 (mild): Secondary | ICD-10-CM | POA: Diagnosis not present

## 2017-08-08 DIAGNOSIS — E1151 Type 2 diabetes mellitus with diabetic peripheral angiopathy without gangrene: Secondary | ICD-10-CM | POA: Diagnosis not present

## 2017-08-08 DIAGNOSIS — I4510 Unspecified right bundle-branch block: Secondary | ICD-10-CM | POA: Diagnosis not present

## 2017-08-08 DIAGNOSIS — S86012D Strain of left Achilles tendon, subsequent encounter: Secondary | ICD-10-CM | POA: Diagnosis not present

## 2017-08-08 NOTE — Telephone Encounter (Signed)
Jason Wyatt - PT assistant states pt says Dr. March Rummage is going to allow him to perform gently ROM, need clarification.

## 2017-08-09 ENCOUNTER — Telehealth: Payer: Self-pay | Admitting: Podiatry

## 2017-08-09 DIAGNOSIS — M48061 Spinal stenosis, lumbar region without neurogenic claudication: Secondary | ICD-10-CM | POA: Diagnosis not present

## 2017-08-09 DIAGNOSIS — S86012D Strain of left Achilles tendon, subsequent encounter: Secondary | ICD-10-CM | POA: Diagnosis not present

## 2017-08-09 DIAGNOSIS — E1142 Type 2 diabetes mellitus with diabetic polyneuropathy: Secondary | ICD-10-CM | POA: Diagnosis not present

## 2017-08-09 DIAGNOSIS — M1711 Unilateral primary osteoarthritis, right knee: Secondary | ICD-10-CM | POA: Diagnosis not present

## 2017-08-09 DIAGNOSIS — I4510 Unspecified right bundle-branch block: Secondary | ICD-10-CM | POA: Diagnosis not present

## 2017-08-09 DIAGNOSIS — G2581 Restless legs syndrome: Secondary | ICD-10-CM | POA: Diagnosis not present

## 2017-08-09 DIAGNOSIS — N182 Chronic kidney disease, stage 2 (mild): Secondary | ICD-10-CM | POA: Diagnosis not present

## 2017-08-09 DIAGNOSIS — E1151 Type 2 diabetes mellitus with diabetic peripheral angiopathy without gangrene: Secondary | ICD-10-CM | POA: Diagnosis not present

## 2017-08-09 DIAGNOSIS — I129 Hypertensive chronic kidney disease with stage 1 through stage 4 chronic kidney disease, or unspecified chronic kidney disease: Secondary | ICD-10-CM | POA: Diagnosis not present

## 2017-08-09 DIAGNOSIS — M1A00X0 Idiopathic chronic gout, unspecified site, without tophus (tophi): Secondary | ICD-10-CM | POA: Diagnosis not present

## 2017-08-09 DIAGNOSIS — E1122 Type 2 diabetes mellitus with diabetic chronic kidney disease: Secondary | ICD-10-CM | POA: Diagnosis not present

## 2017-08-09 NOTE — Telephone Encounter (Signed)
I informed Mallie Snooks - [facility] PT assistant of Dr. Eleanora Neighbor orders and the orders had been sent 08/03/2017 and confirmed received. Debbie requested refax of orders. Faxed orders to [facility] - Attn: Cheral Marker, as requested by Jackelyn Poling.

## 2017-08-09 NOTE — Telephone Encounter (Signed)
Hi, this is [place]. I'm calling from [facility]. I'm just calling to let you know I did receive orders from Dr. March Rummage on Jason Wyatt. Thank you and have a great day.

## 2017-08-09 NOTE — Telephone Encounter (Signed)
He should be working on passive range of motion. After a week of passive week of ROM he can work on AROM with PT

## 2017-08-10 ENCOUNTER — Other Ambulatory Visit: Payer: Self-pay | Admitting: Internal Medicine

## 2017-08-14 DIAGNOSIS — M48061 Spinal stenosis, lumbar region without neurogenic claudication: Secondary | ICD-10-CM | POA: Diagnosis not present

## 2017-08-14 DIAGNOSIS — E785 Hyperlipidemia, unspecified: Secondary | ICD-10-CM | POA: Diagnosis not present

## 2017-08-14 DIAGNOSIS — S86012D Strain of left Achilles tendon, subsequent encounter: Secondary | ICD-10-CM | POA: Diagnosis not present

## 2017-08-14 DIAGNOSIS — E559 Vitamin D deficiency, unspecified: Secondary | ICD-10-CM | POA: Diagnosis not present

## 2017-08-14 DIAGNOSIS — I129 Hypertensive chronic kidney disease with stage 1 through stage 4 chronic kidney disease, or unspecified chronic kidney disease: Secondary | ICD-10-CM | POA: Diagnosis not present

## 2017-08-14 DIAGNOSIS — Z792 Long term (current) use of antibiotics: Secondary | ICD-10-CM | POA: Diagnosis not present

## 2017-08-14 DIAGNOSIS — M1A00X0 Idiopathic chronic gout, unspecified site, without tophus (tophi): Secondary | ICD-10-CM | POA: Diagnosis not present

## 2017-08-14 DIAGNOSIS — G4733 Obstructive sleep apnea (adult) (pediatric): Secondary | ICD-10-CM | POA: Diagnosis not present

## 2017-08-14 DIAGNOSIS — E1151 Type 2 diabetes mellitus with diabetic peripheral angiopathy without gangrene: Secondary | ICD-10-CM | POA: Diagnosis not present

## 2017-08-14 DIAGNOSIS — I4510 Unspecified right bundle-branch block: Secondary | ICD-10-CM | POA: Diagnosis not present

## 2017-08-14 DIAGNOSIS — G2581 Restless legs syndrome: Secondary | ICD-10-CM | POA: Diagnosis not present

## 2017-08-14 DIAGNOSIS — E1122 Type 2 diabetes mellitus with diabetic chronic kidney disease: Secondary | ICD-10-CM | POA: Diagnosis not present

## 2017-08-14 DIAGNOSIS — Z7901 Long term (current) use of anticoagulants: Secondary | ICD-10-CM | POA: Diagnosis not present

## 2017-08-14 DIAGNOSIS — Z7984 Long term (current) use of oral hypoglycemic drugs: Secondary | ICD-10-CM | POA: Diagnosis not present

## 2017-08-14 DIAGNOSIS — E1142 Type 2 diabetes mellitus with diabetic polyneuropathy: Secondary | ICD-10-CM | POA: Diagnosis not present

## 2017-08-14 DIAGNOSIS — M1711 Unilateral primary osteoarthritis, right knee: Secondary | ICD-10-CM | POA: Diagnosis not present

## 2017-08-14 DIAGNOSIS — N182 Chronic kidney disease, stage 2 (mild): Secondary | ICD-10-CM | POA: Diagnosis not present

## 2017-08-14 DIAGNOSIS — Z9181 History of falling: Secondary | ICD-10-CM | POA: Diagnosis not present

## 2017-08-16 ENCOUNTER — Encounter: Payer: Self-pay | Admitting: Podiatry

## 2017-08-16 ENCOUNTER — Ambulatory Visit (INDEPENDENT_AMBULATORY_CARE_PROVIDER_SITE_OTHER): Payer: Medicare Other | Admitting: Podiatry

## 2017-08-16 DIAGNOSIS — Z9889 Other specified postprocedural states: Secondary | ICD-10-CM

## 2017-08-17 ENCOUNTER — Telehealth: Payer: Self-pay | Admitting: *Deleted

## 2017-08-17 DIAGNOSIS — M48061 Spinal stenosis, lumbar region without neurogenic claudication: Secondary | ICD-10-CM | POA: Diagnosis not present

## 2017-08-17 DIAGNOSIS — G2581 Restless legs syndrome: Secondary | ICD-10-CM | POA: Diagnosis not present

## 2017-08-17 DIAGNOSIS — M1A00X0 Idiopathic chronic gout, unspecified site, without tophus (tophi): Secondary | ICD-10-CM | POA: Diagnosis not present

## 2017-08-17 DIAGNOSIS — E785 Hyperlipidemia, unspecified: Secondary | ICD-10-CM | POA: Diagnosis not present

## 2017-08-17 DIAGNOSIS — E1151 Type 2 diabetes mellitus with diabetic peripheral angiopathy without gangrene: Secondary | ICD-10-CM | POA: Diagnosis not present

## 2017-08-17 DIAGNOSIS — G4733 Obstructive sleep apnea (adult) (pediatric): Secondary | ICD-10-CM | POA: Diagnosis not present

## 2017-08-17 DIAGNOSIS — Z792 Long term (current) use of antibiotics: Secondary | ICD-10-CM | POA: Diagnosis not present

## 2017-08-17 DIAGNOSIS — M1711 Unilateral primary osteoarthritis, right knee: Secondary | ICD-10-CM | POA: Diagnosis not present

## 2017-08-17 DIAGNOSIS — E1122 Type 2 diabetes mellitus with diabetic chronic kidney disease: Secondary | ICD-10-CM | POA: Diagnosis not present

## 2017-08-17 DIAGNOSIS — E1142 Type 2 diabetes mellitus with diabetic polyneuropathy: Secondary | ICD-10-CM | POA: Diagnosis not present

## 2017-08-17 DIAGNOSIS — S86012D Strain of left Achilles tendon, subsequent encounter: Secondary | ICD-10-CM | POA: Diagnosis not present

## 2017-08-17 DIAGNOSIS — Z7901 Long term (current) use of anticoagulants: Secondary | ICD-10-CM | POA: Diagnosis not present

## 2017-08-17 DIAGNOSIS — N182 Chronic kidney disease, stage 2 (mild): Secondary | ICD-10-CM | POA: Diagnosis not present

## 2017-08-17 DIAGNOSIS — E559 Vitamin D deficiency, unspecified: Secondary | ICD-10-CM | POA: Diagnosis not present

## 2017-08-17 DIAGNOSIS — Z7984 Long term (current) use of oral hypoglycemic drugs: Secondary | ICD-10-CM | POA: Diagnosis not present

## 2017-08-17 DIAGNOSIS — I129 Hypertensive chronic kidney disease with stage 1 through stage 4 chronic kidney disease, or unspecified chronic kidney disease: Secondary | ICD-10-CM | POA: Diagnosis not present

## 2017-08-17 DIAGNOSIS — I4510 Unspecified right bundle-branch block: Secondary | ICD-10-CM | POA: Diagnosis not present

## 2017-08-17 DIAGNOSIS — Z9181 History of falling: Secondary | ICD-10-CM | POA: Diagnosis not present

## 2017-08-17 NOTE — Telephone Encounter (Signed)
Pt presented to office for an appt with Dr. March Rummage, pt asked if I could help him with the [facility] bill. I told pt I would be happy to get information from out accounts receivable agent and call him with possibly someone who would help him and a phone number. Jason Wyatt gave me [phone], but stated pt may have better luck with the phone number on the bill.

## 2017-08-17 NOTE — Telephone Encounter (Signed)
I informed pt of [facility] [facility] - Accounts Recievable's [phone] and recommendation to contact the phone number on the bill.

## 2017-08-20 ENCOUNTER — Telehealth: Payer: Self-pay | Admitting: Podiatry

## 2017-08-20 NOTE — Telephone Encounter (Signed)
Pt was calling concerning some copies of his bill that he received from hospital. He stated that Val was the one that was helping him. [phone]

## 2017-08-20 NOTE — Telephone Encounter (Signed)
Left message informed pt that I had one of his bills and would mail to him and asked that if he needed he could call me with questions.

## 2017-08-21 DIAGNOSIS — E1151 Type 2 diabetes mellitus with diabetic peripheral angiopathy without gangrene: Secondary | ICD-10-CM | POA: Diagnosis not present

## 2017-08-21 DIAGNOSIS — G2581 Restless legs syndrome: Secondary | ICD-10-CM | POA: Diagnosis not present

## 2017-08-21 DIAGNOSIS — M1A00X0 Idiopathic chronic gout, unspecified site, without tophus (tophi): Secondary | ICD-10-CM | POA: Diagnosis not present

## 2017-08-21 DIAGNOSIS — M1711 Unilateral primary osteoarthritis, right knee: Secondary | ICD-10-CM | POA: Diagnosis not present

## 2017-08-21 DIAGNOSIS — I4510 Unspecified right bundle-branch block: Secondary | ICD-10-CM | POA: Diagnosis not present

## 2017-08-21 DIAGNOSIS — S86012D Strain of left Achilles tendon, subsequent encounter: Secondary | ICD-10-CM | POA: Diagnosis not present

## 2017-08-21 DIAGNOSIS — M48061 Spinal stenosis, lumbar region without neurogenic claudication: Secondary | ICD-10-CM | POA: Diagnosis not present

## 2017-08-21 DIAGNOSIS — N182 Chronic kidney disease, stage 2 (mild): Secondary | ICD-10-CM | POA: Diagnosis not present

## 2017-08-21 DIAGNOSIS — E1142 Type 2 diabetes mellitus with diabetic polyneuropathy: Secondary | ICD-10-CM | POA: Diagnosis not present

## 2017-08-21 DIAGNOSIS — E1122 Type 2 diabetes mellitus with diabetic chronic kidney disease: Secondary | ICD-10-CM | POA: Diagnosis not present

## 2017-08-21 DIAGNOSIS — I129 Hypertensive chronic kidney disease with stage 1 through stage 4 chronic kidney disease, or unspecified chronic kidney disease: Secondary | ICD-10-CM | POA: Diagnosis not present

## 2017-08-22 DIAGNOSIS — E1151 Type 2 diabetes mellitus with diabetic peripheral angiopathy without gangrene: Secondary | ICD-10-CM | POA: Diagnosis not present

## 2017-08-22 DIAGNOSIS — M1711 Unilateral primary osteoarthritis, right knee: Secondary | ICD-10-CM | POA: Diagnosis not present

## 2017-08-22 DIAGNOSIS — E1142 Type 2 diabetes mellitus with diabetic polyneuropathy: Secondary | ICD-10-CM | POA: Diagnosis not present

## 2017-08-22 DIAGNOSIS — N182 Chronic kidney disease, stage 2 (mild): Secondary | ICD-10-CM | POA: Diagnosis not present

## 2017-08-22 DIAGNOSIS — S86012D Strain of left Achilles tendon, subsequent encounter: Secondary | ICD-10-CM | POA: Diagnosis not present

## 2017-08-22 DIAGNOSIS — M1A00X0 Idiopathic chronic gout, unspecified site, without tophus (tophi): Secondary | ICD-10-CM | POA: Diagnosis not present

## 2017-08-22 DIAGNOSIS — M48061 Spinal stenosis, lumbar region without neurogenic claudication: Secondary | ICD-10-CM | POA: Diagnosis not present

## 2017-08-22 DIAGNOSIS — I129 Hypertensive chronic kidney disease with stage 1 through stage 4 chronic kidney disease, or unspecified chronic kidney disease: Secondary | ICD-10-CM | POA: Diagnosis not present

## 2017-08-22 DIAGNOSIS — I4510 Unspecified right bundle-branch block: Secondary | ICD-10-CM | POA: Diagnosis not present

## 2017-08-22 DIAGNOSIS — E1122 Type 2 diabetes mellitus with diabetic chronic kidney disease: Secondary | ICD-10-CM | POA: Diagnosis not present

## 2017-08-22 DIAGNOSIS — G2581 Restless legs syndrome: Secondary | ICD-10-CM | POA: Diagnosis not present

## 2017-08-23 DIAGNOSIS — Z7901 Long term (current) use of anticoagulants: Secondary | ICD-10-CM | POA: Diagnosis not present

## 2017-08-23 DIAGNOSIS — S86012D Strain of left Achilles tendon, subsequent encounter: Secondary | ICD-10-CM | POA: Diagnosis not present

## 2017-08-23 DIAGNOSIS — E1142 Type 2 diabetes mellitus with diabetic polyneuropathy: Secondary | ICD-10-CM | POA: Diagnosis not present

## 2017-08-23 DIAGNOSIS — Z792 Long term (current) use of antibiotics: Secondary | ICD-10-CM | POA: Diagnosis not present

## 2017-08-23 DIAGNOSIS — M1A00X0 Idiopathic chronic gout, unspecified site, without tophus (tophi): Secondary | ICD-10-CM | POA: Diagnosis not present

## 2017-08-23 DIAGNOSIS — G4733 Obstructive sleep apnea (adult) (pediatric): Secondary | ICD-10-CM | POA: Diagnosis not present

## 2017-08-23 DIAGNOSIS — N182 Chronic kidney disease, stage 2 (mild): Secondary | ICD-10-CM | POA: Diagnosis not present

## 2017-08-23 DIAGNOSIS — I4510 Unspecified right bundle-branch block: Secondary | ICD-10-CM | POA: Diagnosis not present

## 2017-08-23 DIAGNOSIS — I129 Hypertensive chronic kidney disease with stage 1 through stage 4 chronic kidney disease, or unspecified chronic kidney disease: Secondary | ICD-10-CM | POA: Diagnosis not present

## 2017-08-23 DIAGNOSIS — Z7984 Long term (current) use of oral hypoglycemic drugs: Secondary | ICD-10-CM | POA: Diagnosis not present

## 2017-08-23 DIAGNOSIS — M48061 Spinal stenosis, lumbar region without neurogenic claudication: Secondary | ICD-10-CM | POA: Diagnosis not present

## 2017-08-23 DIAGNOSIS — G2581 Restless legs syndrome: Secondary | ICD-10-CM | POA: Diagnosis not present

## 2017-08-23 DIAGNOSIS — E1151 Type 2 diabetes mellitus with diabetic peripheral angiopathy without gangrene: Secondary | ICD-10-CM | POA: Diagnosis not present

## 2017-08-23 DIAGNOSIS — M1711 Unilateral primary osteoarthritis, right knee: Secondary | ICD-10-CM | POA: Diagnosis not present

## 2017-08-23 DIAGNOSIS — Z9181 History of falling: Secondary | ICD-10-CM | POA: Diagnosis not present

## 2017-08-23 DIAGNOSIS — E785 Hyperlipidemia, unspecified: Secondary | ICD-10-CM | POA: Diagnosis not present

## 2017-08-23 DIAGNOSIS — E559 Vitamin D deficiency, unspecified: Secondary | ICD-10-CM | POA: Diagnosis not present

## 2017-08-23 DIAGNOSIS — E1122 Type 2 diabetes mellitus with diabetic chronic kidney disease: Secondary | ICD-10-CM | POA: Diagnosis not present

## 2017-08-28 DIAGNOSIS — E1142 Type 2 diabetes mellitus with diabetic polyneuropathy: Secondary | ICD-10-CM | POA: Diagnosis not present

## 2017-08-28 DIAGNOSIS — M1A00X0 Idiopathic chronic gout, unspecified site, without tophus (tophi): Secondary | ICD-10-CM | POA: Diagnosis not present

## 2017-08-28 DIAGNOSIS — E1151 Type 2 diabetes mellitus with diabetic peripheral angiopathy without gangrene: Secondary | ICD-10-CM | POA: Diagnosis not present

## 2017-08-28 DIAGNOSIS — I129 Hypertensive chronic kidney disease with stage 1 through stage 4 chronic kidney disease, or unspecified chronic kidney disease: Secondary | ICD-10-CM | POA: Diagnosis not present

## 2017-08-28 DIAGNOSIS — S86012D Strain of left Achilles tendon, subsequent encounter: Secondary | ICD-10-CM | POA: Diagnosis not present

## 2017-08-28 DIAGNOSIS — M48061 Spinal stenosis, lumbar region without neurogenic claudication: Secondary | ICD-10-CM | POA: Diagnosis not present

## 2017-08-28 DIAGNOSIS — E1122 Type 2 diabetes mellitus with diabetic chronic kidney disease: Secondary | ICD-10-CM | POA: Diagnosis not present

## 2017-08-28 DIAGNOSIS — G2581 Restless legs syndrome: Secondary | ICD-10-CM | POA: Diagnosis not present

## 2017-08-28 DIAGNOSIS — M1711 Unilateral primary osteoarthritis, right knee: Secondary | ICD-10-CM | POA: Diagnosis not present

## 2017-08-28 DIAGNOSIS — I4510 Unspecified right bundle-branch block: Secondary | ICD-10-CM | POA: Diagnosis not present

## 2017-08-28 DIAGNOSIS — N182 Chronic kidney disease, stage 2 (mild): Secondary | ICD-10-CM | POA: Diagnosis not present

## 2017-08-30 ENCOUNTER — Ambulatory Visit (INDEPENDENT_AMBULATORY_CARE_PROVIDER_SITE_OTHER): Payer: Medicare Other | Admitting: Podiatry

## 2017-08-30 DIAGNOSIS — Z9889 Other specified postprocedural states: Secondary | ICD-10-CM

## 2017-08-30 NOTE — Progress Notes (Signed)
Jason Wyatt updated PT orders are as follows:  Continue with physical therapy at home Transition out of boot to surgical shoe next week Continue with passive and active range of motion, Continue with quad cane.  Plan; transition to normal shoes in 2 to 3 weeks.

## 2017-09-05 DIAGNOSIS — M1711 Unilateral primary osteoarthritis, right knee: Secondary | ICD-10-CM | POA: Diagnosis not present

## 2017-09-05 DIAGNOSIS — S86012D Strain of left Achilles tendon, subsequent encounter: Secondary | ICD-10-CM | POA: Diagnosis not present

## 2017-09-05 DIAGNOSIS — I129 Hypertensive chronic kidney disease with stage 1 through stage 4 chronic kidney disease, or unspecified chronic kidney disease: Secondary | ICD-10-CM | POA: Diagnosis not present

## 2017-09-05 DIAGNOSIS — E785 Hyperlipidemia, unspecified: Secondary | ICD-10-CM | POA: Diagnosis not present

## 2017-09-05 DIAGNOSIS — E1142 Type 2 diabetes mellitus with diabetic polyneuropathy: Secondary | ICD-10-CM | POA: Diagnosis not present

## 2017-09-05 DIAGNOSIS — G2581 Restless legs syndrome: Secondary | ICD-10-CM | POA: Diagnosis not present

## 2017-09-05 DIAGNOSIS — G4733 Obstructive sleep apnea (adult) (pediatric): Secondary | ICD-10-CM | POA: Diagnosis not present

## 2017-09-05 DIAGNOSIS — E1151 Type 2 diabetes mellitus with diabetic peripheral angiopathy without gangrene: Secondary | ICD-10-CM | POA: Diagnosis not present

## 2017-09-05 DIAGNOSIS — Z7984 Long term (current) use of oral hypoglycemic drugs: Secondary | ICD-10-CM | POA: Diagnosis not present

## 2017-09-05 DIAGNOSIS — M48061 Spinal stenosis, lumbar region without neurogenic claudication: Secondary | ICD-10-CM | POA: Diagnosis not present

## 2017-09-05 DIAGNOSIS — E559 Vitamin D deficiency, unspecified: Secondary | ICD-10-CM | POA: Diagnosis not present

## 2017-09-05 DIAGNOSIS — N182 Chronic kidney disease, stage 2 (mild): Secondary | ICD-10-CM | POA: Diagnosis not present

## 2017-09-05 DIAGNOSIS — E1122 Type 2 diabetes mellitus with diabetic chronic kidney disease: Secondary | ICD-10-CM | POA: Diagnosis not present

## 2017-09-05 DIAGNOSIS — Z792 Long term (current) use of antibiotics: Secondary | ICD-10-CM | POA: Diagnosis not present

## 2017-09-05 DIAGNOSIS — I4510 Unspecified right bundle-branch block: Secondary | ICD-10-CM | POA: Diagnosis not present

## 2017-09-05 DIAGNOSIS — Z9181 History of falling: Secondary | ICD-10-CM | POA: Diagnosis not present

## 2017-09-05 DIAGNOSIS — M1A00X0 Idiopathic chronic gout, unspecified site, without tophus (tophi): Secondary | ICD-10-CM | POA: Diagnosis not present

## 2017-09-05 DIAGNOSIS — Z7901 Long term (current) use of anticoagulants: Secondary | ICD-10-CM | POA: Diagnosis not present

## 2017-09-11 DIAGNOSIS — E1142 Type 2 diabetes mellitus with diabetic polyneuropathy: Secondary | ICD-10-CM | POA: Diagnosis not present

## 2017-09-11 DIAGNOSIS — Z7901 Long term (current) use of anticoagulants: Secondary | ICD-10-CM | POA: Diagnosis not present

## 2017-09-11 DIAGNOSIS — M48061 Spinal stenosis, lumbar region without neurogenic claudication: Secondary | ICD-10-CM | POA: Diagnosis not present

## 2017-09-11 DIAGNOSIS — M1711 Unilateral primary osteoarthritis, right knee: Secondary | ICD-10-CM | POA: Diagnosis not present

## 2017-09-11 DIAGNOSIS — G4733 Obstructive sleep apnea (adult) (pediatric): Secondary | ICD-10-CM | POA: Diagnosis not present

## 2017-09-11 DIAGNOSIS — Z7984 Long term (current) use of oral hypoglycemic drugs: Secondary | ICD-10-CM | POA: Diagnosis not present

## 2017-09-11 DIAGNOSIS — E559 Vitamin D deficiency, unspecified: Secondary | ICD-10-CM | POA: Diagnosis not present

## 2017-09-11 DIAGNOSIS — S86012D Strain of left Achilles tendon, subsequent encounter: Secondary | ICD-10-CM | POA: Diagnosis not present

## 2017-09-11 DIAGNOSIS — N182 Chronic kidney disease, stage 2 (mild): Secondary | ICD-10-CM | POA: Diagnosis not present

## 2017-09-11 DIAGNOSIS — Z9181 History of falling: Secondary | ICD-10-CM | POA: Diagnosis not present

## 2017-09-11 DIAGNOSIS — Z792 Long term (current) use of antibiotics: Secondary | ICD-10-CM | POA: Diagnosis not present

## 2017-09-11 DIAGNOSIS — I129 Hypertensive chronic kidney disease with stage 1 through stage 4 chronic kidney disease, or unspecified chronic kidney disease: Secondary | ICD-10-CM | POA: Diagnosis not present

## 2017-09-11 DIAGNOSIS — E785 Hyperlipidemia, unspecified: Secondary | ICD-10-CM | POA: Diagnosis not present

## 2017-09-11 DIAGNOSIS — E1122 Type 2 diabetes mellitus with diabetic chronic kidney disease: Secondary | ICD-10-CM | POA: Diagnosis not present

## 2017-09-11 DIAGNOSIS — M1A00X0 Idiopathic chronic gout, unspecified site, without tophus (tophi): Secondary | ICD-10-CM | POA: Diagnosis not present

## 2017-09-11 DIAGNOSIS — E1151 Type 2 diabetes mellitus with diabetic peripheral angiopathy without gangrene: Secondary | ICD-10-CM | POA: Diagnosis not present

## 2017-09-11 DIAGNOSIS — I4510 Unspecified right bundle-branch block: Secondary | ICD-10-CM | POA: Diagnosis not present

## 2017-09-11 DIAGNOSIS — G2581 Restless legs syndrome: Secondary | ICD-10-CM | POA: Diagnosis not present

## 2017-09-12 ENCOUNTER — Ambulatory Visit (INDEPENDENT_AMBULATORY_CARE_PROVIDER_SITE_OTHER): Payer: Self-pay | Admitting: Podiatry

## 2017-09-12 ENCOUNTER — Encounter: Payer: Self-pay | Admitting: Podiatry

## 2017-09-12 VITALS — Temp 99.3°F

## 2017-09-12 DIAGNOSIS — Z9889 Other specified postprocedural states: Secondary | ICD-10-CM

## 2017-09-12 NOTE — Progress Notes (Signed)
  Subjective:  Patient ID: Jason Wyatt, male    DOB: [birth year],  MRN: [id number]  Chief Complaint  Patient presents with  . Routine Post Op    achilles tendon left     DOS: 06/27/17 Procedure: L Gastroc Recession.  62 y.o. male returns for post-op check. Denies N/V/F/Ch.  Doing well ambulating in normal shoe gear with assistance of a single cane.  Denies postoperative issues.  Objective:   General AA&O x3. Normal mood and affect.  Vascular Foot warm and well perfused.  Neurologic Gross sensation intact.  Dermatologic Skin well-healed  Orthopedic:  No tenderness to palpation noted about the surgical site.  Good plantar flexor strength.  Nonantalgic gait.    Assessment & Plan:  Patient was evaluated and treated and all questions answered.  S/p left Achilles repair with flexor tendon transfer -Progressing as expected post-operatively. -Sutures: Skin well-healed -Medications refilled: none -No additional PT necessary at this time.  Patient is ambulate with minimal assistance of cane.  Denies pain.  Doing well.  Return in about 6 weeks (around 10/24/2017) for Post-op.

## 2017-09-13 ENCOUNTER — Encounter: Payer: Medicare Other | Admitting: Podiatry

## 2017-09-14 ENCOUNTER — Encounter: Payer: Medicare Other | Admitting: Podiatry

## 2017-09-14 DIAGNOSIS — E1151 Type 2 diabetes mellitus with diabetic peripheral angiopathy without gangrene: Secondary | ICD-10-CM | POA: Diagnosis not present

## 2017-09-14 DIAGNOSIS — E785 Hyperlipidemia, unspecified: Secondary | ICD-10-CM | POA: Diagnosis not present

## 2017-09-14 DIAGNOSIS — E1122 Type 2 diabetes mellitus with diabetic chronic kidney disease: Secondary | ICD-10-CM | POA: Diagnosis not present

## 2017-09-14 DIAGNOSIS — M1711 Unilateral primary osteoarthritis, right knee: Secondary | ICD-10-CM | POA: Diagnosis not present

## 2017-09-14 DIAGNOSIS — G4733 Obstructive sleep apnea (adult) (pediatric): Secondary | ICD-10-CM | POA: Diagnosis not present

## 2017-09-14 DIAGNOSIS — I4510 Unspecified right bundle-branch block: Secondary | ICD-10-CM | POA: Diagnosis not present

## 2017-09-14 DIAGNOSIS — M1A00X0 Idiopathic chronic gout, unspecified site, without tophus (tophi): Secondary | ICD-10-CM | POA: Diagnosis not present

## 2017-09-14 DIAGNOSIS — Z9181 History of falling: Secondary | ICD-10-CM | POA: Diagnosis not present

## 2017-09-14 DIAGNOSIS — E1142 Type 2 diabetes mellitus with diabetic polyneuropathy: Secondary | ICD-10-CM | POA: Diagnosis not present

## 2017-09-14 DIAGNOSIS — Z7901 Long term (current) use of anticoagulants: Secondary | ICD-10-CM | POA: Diagnosis not present

## 2017-09-14 DIAGNOSIS — E559 Vitamin D deficiency, unspecified: Secondary | ICD-10-CM | POA: Diagnosis not present

## 2017-09-14 DIAGNOSIS — S86012D Strain of left Achilles tendon, subsequent encounter: Secondary | ICD-10-CM | POA: Diagnosis not present

## 2017-09-14 DIAGNOSIS — M48061 Spinal stenosis, lumbar region without neurogenic claudication: Secondary | ICD-10-CM | POA: Diagnosis not present

## 2017-09-14 DIAGNOSIS — I129 Hypertensive chronic kidney disease with stage 1 through stage 4 chronic kidney disease, or unspecified chronic kidney disease: Secondary | ICD-10-CM | POA: Diagnosis not present

## 2017-09-14 DIAGNOSIS — G2581 Restless legs syndrome: Secondary | ICD-10-CM | POA: Diagnosis not present

## 2017-09-14 DIAGNOSIS — N182 Chronic kidney disease, stage 2 (mild): Secondary | ICD-10-CM | POA: Diagnosis not present

## 2017-09-14 DIAGNOSIS — Z7984 Long term (current) use of oral hypoglycemic drugs: Secondary | ICD-10-CM | POA: Diagnosis not present

## 2017-09-14 DIAGNOSIS — Z792 Long term (current) use of antibiotics: Secondary | ICD-10-CM | POA: Diagnosis not present

## 2017-09-18 ENCOUNTER — Other Ambulatory Visit: Payer: Self-pay | Admitting: Physician Assistant

## 2017-09-18 DIAGNOSIS — M48061 Spinal stenosis, lumbar region without neurogenic claudication: Secondary | ICD-10-CM | POA: Diagnosis not present

## 2017-09-18 DIAGNOSIS — G2581 Restless legs syndrome: Secondary | ICD-10-CM | POA: Diagnosis not present

## 2017-09-18 DIAGNOSIS — Z7901 Long term (current) use of anticoagulants: Secondary | ICD-10-CM | POA: Diagnosis not present

## 2017-09-18 DIAGNOSIS — Z792 Long term (current) use of antibiotics: Secondary | ICD-10-CM | POA: Diagnosis not present

## 2017-09-18 DIAGNOSIS — N182 Chronic kidney disease, stage 2 (mild): Secondary | ICD-10-CM | POA: Diagnosis not present

## 2017-09-18 DIAGNOSIS — E785 Hyperlipidemia, unspecified: Secondary | ICD-10-CM | POA: Diagnosis not present

## 2017-09-18 DIAGNOSIS — S86012D Strain of left Achilles tendon, subsequent encounter: Secondary | ICD-10-CM | POA: Diagnosis not present

## 2017-09-18 DIAGNOSIS — M1711 Unilateral primary osteoarthritis, right knee: Secondary | ICD-10-CM | POA: Diagnosis not present

## 2017-09-18 DIAGNOSIS — I129 Hypertensive chronic kidney disease with stage 1 through stage 4 chronic kidney disease, or unspecified chronic kidney disease: Secondary | ICD-10-CM | POA: Diagnosis not present

## 2017-09-18 DIAGNOSIS — Z9181 History of falling: Secondary | ICD-10-CM | POA: Diagnosis not present

## 2017-09-18 DIAGNOSIS — E1142 Type 2 diabetes mellitus with diabetic polyneuropathy: Secondary | ICD-10-CM | POA: Diagnosis not present

## 2017-09-18 DIAGNOSIS — E559 Vitamin D deficiency, unspecified: Secondary | ICD-10-CM | POA: Diagnosis not present

## 2017-09-18 DIAGNOSIS — E1122 Type 2 diabetes mellitus with diabetic chronic kidney disease: Secondary | ICD-10-CM | POA: Diagnosis not present

## 2017-09-18 DIAGNOSIS — I4510 Unspecified right bundle-branch block: Secondary | ICD-10-CM | POA: Diagnosis not present

## 2017-09-18 DIAGNOSIS — Z7984 Long term (current) use of oral hypoglycemic drugs: Secondary | ICD-10-CM | POA: Diagnosis not present

## 2017-09-18 DIAGNOSIS — M1A00X0 Idiopathic chronic gout, unspecified site, without tophus (tophi): Secondary | ICD-10-CM | POA: Diagnosis not present

## 2017-09-18 DIAGNOSIS — E1151 Type 2 diabetes mellitus with diabetic peripheral angiopathy without gangrene: Secondary | ICD-10-CM | POA: Diagnosis not present

## 2017-09-18 DIAGNOSIS — G4733 Obstructive sleep apnea (adult) (pediatric): Secondary | ICD-10-CM | POA: Diagnosis not present

## 2017-09-23 NOTE — Progress Notes (Signed)
  Subjective:  Patient ID: Jason Wyatt, male    DOB: 19-Jan-1955,  MRN: [id number]  Chief Complaint  Patient presents with  . Routine Post Op       DOS: 6.12.19 Gastrocnemius Recess LT POV - no xray, left foot    DOS: 06/27/17 Procedure: L Gastroc Recession.  62 y.o. male returns for post-op check. Denies N/V/F/Ch.  Doing well PT is helping.  Objective:   General AA&O x3. Normal mood and affect.  Vascular Foot warm and well perfused.  Neurologic Gross sensation intact.  Dermatologic Incision well healed.No signs of infection.  Orthopedic: No tenderness to palpation noted about the surgical site. Good Achilles strength noted. Achilles tendon intact without step off or deficit.    Assessment & Plan:  Patient was evaluated and treated and all questions answered.  S/p left Achilles repair with flexor tendon transfer -Progressing as expected post-operatively. -Continue PT with progression of WB  Return in about 2 weeks (around 09/13/2017) for Post-op.

## 2017-09-23 NOTE — Progress Notes (Signed)
  Subjective:  Patient ID: Jason Wyatt, male    DOB: [birth year],  MRN: [id number]  Chief Complaint  Patient presents with  . Routine Post Op    DOS: 6.12.19 Gastrocnemius Recess LT POV - no xray, left foot-only time available    DOS: 06/27/17 Procedure: L Gastroc Recession.  62 y.o. male returns for post-op check. Denies N/V/F/Ch.  Doing well.  Denies pain.  Objective:   General AA&O x3. Normal mood and affect.  Vascular Foot warm and well perfused.  Neurologic Gross sensation intact.  Dermatologic No signs of dehiscence. Incision healing well.  Orthopedic: Tenderness to palpation noted about the surgical site.    Assessment & Plan:  Patient was evaluated and treated and all questions answered.  S/p left Achilles repair with flexor tendon transfer -Progressing as expected post-operatively. -Sutures: Remaining staples removed -Medications refilled: none -Foot redressed.  Updated orders for home PT.  Return in about 2 weeks (around 08/30/2017) for Post-op, Wound Care.

## 2017-10-07 ENCOUNTER — Other Ambulatory Visit: Payer: Self-pay | Admitting: Internal Medicine

## 2017-10-07 ENCOUNTER — Other Ambulatory Visit: Payer: Self-pay | Admitting: Physician Assistant

## 2017-10-07 DIAGNOSIS — K219 Gastro-esophageal reflux disease without esophagitis: Secondary | ICD-10-CM

## 2017-10-07 MED ORDER — RANITIDINE HCL 300 MG PO TABS: ORAL_TABLET | ORAL | 3 refills | Status: DC

## 2017-10-30 ENCOUNTER — Ambulatory Visit: Payer: Medicare Other | Admitting: Podiatry

## 2017-10-30 ENCOUNTER — Encounter: Payer: Self-pay | Admitting: Podiatry

## 2017-10-30 DIAGNOSIS — E1142 Type 2 diabetes mellitus with diabetic polyneuropathy: Secondary | ICD-10-CM

## 2017-10-30 DIAGNOSIS — Q828 Other specified congenital malformations of skin: Secondary | ICD-10-CM | POA: Diagnosis not present

## 2017-10-30 DIAGNOSIS — B351 Tinea unguium: Secondary | ICD-10-CM | POA: Diagnosis not present

## 2017-10-30 DIAGNOSIS — M79676 Pain in unspecified toe(s): Secondary | ICD-10-CM

## 2017-10-30 NOTE — Progress Notes (Signed)
He presents today with a history of diabetic peripheral neuropathy and a chief complaint of painful elongated toenails bilaterally.  Objective: Pulses are strongly palpable neurologic sensorium is diminished per Semmes Weinstein monofilament is a long thick yellow dystrophic-like mycotic no open lesions or wounds are noted.  Assessment: Diabetic peripheral neuropathy pain in limb secondary to onychomycosis.  Plan: Debridement of toenails 1 through 5 bilateral.

## 2017-11-05 ENCOUNTER — Telehealth: Payer: Self-pay

## 2017-11-05 NOTE — Telephone Encounter (Signed)
Pt called for samples of Januvia for himself & his wife. Samples were left up front for pick up. Pt was called to inform of this

## 2017-11-28 ENCOUNTER — Other Ambulatory Visit: Payer: Self-pay | Admitting: Adult Health

## 2017-12-03 NOTE — Progress Notes (Signed)
Patient ID: Jason Wyatt, male   DOB: Aug 01, 1955, 62 y.o.   MRN: [id number]  3 month follow up and CPE  Assessment and Plan   Essential hypertension Continue medication Monitor blood pressure at home; call if consistently over 130/80 Continue DASH diet.   Reminder to go to the ER if any CP, SOB, nausea, dizziness, severe HA, changes vision/speech, left arm numbness and tingling and jaw pain.  Atherosclerotic peripheral vascular disease (HCC) Control blood pressure, cholesterol, glucose, increase exercise.   Type 2 diabetes mellitus with hyperlipidemia [facility]) Education: Reviewed 'ABCs' of diabetes management (respective goals in parentheses):  A1C (<7), blood pressure (<130/80), and cholesterol (LDL <70) Eye Exam yearly and Dental Exam every 6 months. Dietary recommendations Physical Activity recommendations - check lipid panel  Type 2 diabetes mellitus with other diabetic kidney complication, without long-term current use of insulin [facility]) Education: Reviewed 'ABCs' of diabetes management (respective goals in parentheses):  A1C (<7), blood pressure (<130/80), and cholesterol (LDL <70) Eye Exam yearly and Dental Exam every 6 months. Dietary recommendations Physical Activity recommendations  - CMP/GFR  DM type 2 with diabetic peripheral neuropathy (HCC) Check feet at home daily, call with changes/concerns Followed closely by podiatry  Rupture of left Achilles tendon, sequela S/p repair by Dr. March Rummage  Primary osteoarthritis of left knee Followed by ortho  CKD (chronic kidney disease), stage II Increase fluids, avoid NSAIDS, monitor sugars, will monitor  RLS Continue meds, increase walking, monitor iron/B12  Morbid obesity (BMI 41+) Long discussion about weight loss, diet, and exercise Recommended diet heavy in fruits and veggies and low in animal meats, cheeses, and dairy products, appropriate calorie intake Discussed appropriate weight for height  Follow up at next  visit  Medication management CBC, CMP/GFR, magnesium  Hyperlipidemia, mixed Continue medications Continue low cholesterol diet and exercise.  Check lipid panel.   Idiopathic chronic gout without tophus, unspecified site Continue allopurinol Diet discussed Check uric acid as needed  Obesity hypoventilation syndrome (HCC) Continue CPAP  Needs flu shot -     FLU VACCINE MDCK QUAD W/Preservative  Bilateral hand pain -     Ambulatory referral to Orthopedics - get on carpal tunnel braces at night - may benefit from injections- likely some OA/tendonitis left thumb as well- will refer to ortho  Vitamin D deficiency -     VITAMIN D 25 Hydroxy (Vit-D Deficiency, Fractures)  Iron deficiency -     Iron,Total/Total Iron Binding Cap  B12 deficiency -     Vitamin B12    Future Appointments  Date Time Provider [place]  01/29/2018  8:45 AM [place], Bennie Pierini T, DPM [facility]  07/29/2018 11:15 AM Liane Comber, NP [facility] None  12/16/2018  9:00 AM Vicie Mutters, PA-C [facility] None    HPI  62 y.o. male  presents for 3 month follow up with hypertension, hyperlipidemia, diabetes and vitamin D deficiency and CPE  Has a ruptured achilles tendon in June s/p repair, followed with Dr. March Rummage. He was in a wheelchair x 10 weeks. He states since surgery he has had straining with BM's, he doubled his magnesium and is on stool softener. They are getting better slowly. He did not take any pain medications, only tyelnol. He does drink plenty of water, was imobile x 10 weeks. He states his balance is off, comes and goes. Can be worse with standing or moving. He is taking flomax in the AM.   He complains of pain in his bilateral hands hands, feels very stiff and  he has a hard time picking up things, he is dropping things, has been going on x May.   BMI is Body mass index is 40.68 kg/m., he has not been working on diet and exercise due to recent surgery.  Wt Readings from Last 3  Encounters:  12/04/17 (!) 312 lb 9.6 oz (141.8 kg)  07/16/17 (!) 313 lb (142 kg)  06/27/17 246 lb (111.6 kg)   His blood pressure has been controlled at home, today their BP is BP: 132/74  He does not workout. He denies chest pain, shortness of breath, dizziness.   He is not on cholesterol medication, on fish oil only, has never been on statins and denies myalgias. His cholesterol is not at goal. The cholesterol last visit was:   Lab Results  Component Value Date   CHOL 161 07/16/2017   HDL 29 (L) 07/16/2017   LDLCALC 84 07/16/2017   TRIG 361 (H) 07/16/2017   CHOLHDL 5.6 (H) 07/16/2017    He has been working on diet  for Type 2 diabetes on metformin and januvia, sugars are coming down slowly average is 140's, and denies foot ulcerations, increased appetite, nausea, paresthesia of the feet, polydipsia, polyuria, visual disturbances, vomiting and weight loss.  Last A1C in the office was:  Lab Results  Component Value Date   HGBA1C 7.8 (H) 07/16/2017   Patient is on Vitamin D supplement.   Lab Results  Component Value Date   VD25OH 85 03/30/2017     Patient is on allopurinol for gout and does not report a recent flare.  Lab Results  Component Value Date   LABURIC 6.5 05/28/2017     Current Medications:   Current Outpatient Medications (Endocrine & Metabolic):  [name]  JANUVIA 100 MG tablet, TAKE 1 TABLET BY MOUTH  DAILY .  metFORMIN (GLUCOPHAGE) 1000 MG tablet, TAKE 1 TABLET BY MOUTH  TWICE A DAY WITH MEALS  Current Outpatient Medications (Cardiovascular):  .  enalapril (VASOTEC) 20 MG tablet, TAKE 1 TABLET BY MOUTH  TWICE A DAY .  omega-3 acid ethyl esters (LOVAZA) 1 g capsule, TAKE 2 BY MOUTH TWICE DAILY  Current Outpatient Medications (Respiratory):  .  fexofenadine (ALLEGRA) 180 MG tablet, Take 180 mg by mouth daily as needed for allergies.   Current Outpatient Medications (Analgesics):  .  acetaminophen (TYLENOL) 500 MG tablet, Take 500 mg by mouth every 6 (six) hours as  needed for moderate pain. [name]  allopurinol (ZYLOPRIM) 300 MG tablet, TAKE 1 TABLET BY MOUTH AT  BEDTIME .  meloxicam (MOBIC) 15 MG tablet, Take 15 mg by mouth daily as needed for pain.   Current Outpatient Medications (Hematological):  .  enoxaparin (LOVENOX) 40 MG/0.4ML injection, Inject 0.4 mLs (40 mg total) into the skin daily.  Current Outpatient Medications (Other):  .  acyclovir (ZOVIRAX) 400 MG tablet, TAKE 1 TABLET BY MOUTH  DAILY AS NEEDED FOR COLD  SORES .  bisacodyl (DULCOLAX) 10 MG suppository, Place 1 suppository (10 mg total) rectally daily as needed for moderate constipation. .  carbidopa-levodopa (SINEMET IR) 25-250 MG tablet, TAKE 1 TABLET BY MOUTH TWO  TIMES DAILY FOR RESTLESS  LEGS .  DULoxetine (CYMBALTA) 60 MG capsule, TAKE 1 CAPSULE BY MOUTH AT  BEDTIME .  gabapentin (NEURONTIN) 300 MG capsule, TAKE TWO CAPSULES BY MOUTH  THREE TIMES DAILY. [name]  glucose blood (ONE TOUCH ULTRA TEST) test strip, CHECK BLOOD SUGAR 1 TIME  DAILY .  glucose blood test strip, 1 each by Other route  as needed for other. Use as instructed .  Magnesium 500 MG CAPS, Take 500 mg by mouth every evening.  .  methocarbamol (ROBAXIN) 500 MG tablet, Take 1 tablet (500 mg total) by mouth daily as needed for muscle spasms. .  Multiple Vitamin (MULTIVITAMIN) tablet, Take 1 tablet by mouth daily. .  ranitidine (ZANTAC) 300 MG tablet, Take 1 tablet at Bedtime for Acid Reflux & Indigestion .  tamsulosin (FLOMAX) 0.4 MG CAPS capsule, TAKE 1 CAPSULE BY MOUTH  DAILY AFTER SUPPER .  Vitamin D, Ergocalciferol, (DRISDOL) 50000 units CAPS capsule, Take 1 capsule (50,000 Units total) by mouth as directed. Tuesday , Wednesday, Thursday, Sunday (Patient taking differently: Take 50,000 Units by mouth 2 (two) times a week. Wednesday and Sunday)  Medical History:  Past Medical History:  Diagnosis Date  . Acute meniscal tear of left knee   . Allergic rhinitis   . Arthritis     back  . Borderline glaucoma   . Chronic back  pain    stenosis  . CKD (chronic kidney disease), stage II   . Complication of anesthesia cervical fusion surgery 06/2010   "woke up next day w/ventilator on" told due to OSA  . GERD (gastroesophageal reflux disease)    takes Prevacid daily  . Gout    takes Allopurinol daily and Colchicine if needed  . History of adenomatous polyp of colon    tubular adenoma's  . History of Barrett's esophagus   . History of hiatal hernia   . Hyperlipidemia   . Hypertension   . Incomplete right bundle branch block (RBBB)   . OA (osteoarthritis)    knee  . OSA on CPAP   . Peripheral neuropathy   . Prostate cancer [facility]) urologist-  dr Alinda Money   dx 2013  s/p  radical non-nerve sparing prostatectomy 2014--  Stage pT2cNx,  PSA 3.48,  Gleason 3+3,  vol 55cc/  current PSA  0.01 (Aug 2016)  . Thyroid nodule    left side  . Type II diabetes mellitus ([place])   . Urinary frequency    takes Flomax daily  . Urine incontinence   . Wears glasses    Allergies Allergies  Allergen Reactions  . Flaxseed [Linseed Oil] Rash  . Lyrica [Pregabalin] Itching and Rash  . Niaspan [Niacin Er] Rash   Preventative Medicine Immunization History  Administered Date(s) Administered  . Influenza Inj Mdck Quad With Preservative 11/22/2016  . Influenza Split 10/17/2013, 10/29/2014  . Influenza, Seasonal, Injecte, Preservative Fre 11/29/2015  . Influenza-Unspecified 11/04/2012  . PPD Test 01/16/2006, 04/07/2013  . Pneumococcal Polysaccharide-23 04/07/2013, 11/22/2016  . Pneumococcal-Unspecified 09/17/2010  . Td 01/17/2011  . Zoster 01/16/2005   Colonoscopy 2016 - Dr. Cristina Gong - report requested EGD 2013 Echo 2013 CXR 2017 Ct head 05/2015  Tetanus: 2013 Influenza: 2018 Pneumonia: 2015, 2018 Prevnar 13: 2012 Shingles: 2007  Vision: Dr. Einar Gip, scheduled Dec 11th  Dental: Dr. Coralyn Pear, 2019, goes q59m  SURGICAL HISTORY He  has a past surgical history that includes Posterior fusion cervical spine (07-05-2010);  Esophagogastroduodenoscopy (09/26/2011); Robot assisted laparoscopic radical prostatectomy (02/07/2012); wisdom teeth extracted  (1982); left and right heart catheterization with coronary angiogram (N/A, 08/25/2011); Anal fissure repair (06-05-2002); Cardiovascular stress test (01-01-2008); transthoracic echocardiogram (08-03-2011); Posterior lumbar fusion (05-18-2014); Uvulopalatopharyngoplasty (uppp)/tonsillectomy/septoplasty (1990's); Knee arthroscopy with medial menisectomy (Left, 12/31/2014); Cardiac catheterization; Back surgery; Total knee arthroplasty (Left, 08/05/2015); Gastroc recession extremity (Left, 06/27/2017); Achilles tendon surgery (Left, 06/27/2017); and Tendon transfer (Left, 06/27/2017). FAMILY HISTORY His family history includes  Cancer in his father; Dementia in his mother; Hyperlipidemia in his sister; Hypertension in his mother and sister; Osteoarthritis in his mother; Osteoporosis in his mother. SOCIAL HISTORY He  reports that he has never smoked. He has never used smokeless tobacco. He reports that he does not drink alcohol or use drugs.   Review of Systems:  Review of Systems  Constitutional: Negative for chills, diaphoresis, fever, malaise/fatigue and weight loss.  HENT: Negative for congestion, ear pain, hearing loss, sore throat and tinnitus.   Eyes: Negative.  Negative for blurred vision and double vision.  Respiratory: Negative for cough, sputum production, shortness of breath and wheezing.   Cardiovascular: Negative for chest pain, palpitations, orthopnea, claudication, leg swelling and PND.  Gastrointestinal: Negative for abdominal pain, blood in stool, constipation, diarrhea, heartburn, melena, nausea and vomiting.  Genitourinary: Negative.   Musculoskeletal: Negative for falls, joint pain and myalgias.  Skin: Negative.  Negative for rash.  Neurological: Negative for dizziness, tingling, sensory change, loss of consciousness, weakness and headaches.  Endo/Heme/Allergies:  Negative for polydipsia.  Psychiatric/Behavioral: Negative.  Negative for depression, memory loss, substance abuse and suicidal ideas. The patient is not nervous/anxious and does not have insomnia.   All other systems reviewed and are negative.   Physical Exam: BP 132/74   Pulse 100   Temp (!) 97.3 F (36.3 C)   Resp 16   Ht 6' 1.5" (1.867 m)   Wt (!) 312 lb 9.6 oz (141.8 kg)   SpO2 99%   BMI 40.68 kg/m  Wt Readings from Last 3 Encounters:  12/04/17 (!) 312 lb 9.6 oz (141.8 kg)  07/16/17 (!) 313 lb (142 kg)  06/27/17 246 lb (111.6 kg)   General Appearance: Well nourished well developed, non-toxic appearing, in no apparent distress. Eyes: PERRLA, EOMs, conjunctiva no swelling or erythema ENT/Mouth: Ear canals clear with no erythema, swelling, or discharge.  TMs normal bilaterally, oropharynx clear, moist, with no exudate.   Neck: Supple, thyroid normal, no JVD, no cervical adenopathy.  Respiratory: Respiratory effort normal, breath sounds clear A&P, no wheeze, rhonchi or rales noted.  No retractions, no accessory muscle usage Cardio: RRR with no MRGs. No edema noted Abdomen: Soft, + BS, obese,  Non tender, no guarding, rebound, hernias, masses. Musculoskeletal: Full ROM, Bilateral hand exam neurovascularly intact.  No thenar eminence wasting, + decrease grip and pinch grasp left more than right.  Positive tinels and phalens bilaterally. + finkelstein's left hand, + pain at MCP, decreased sensation median nerve distribution right hand.  Skin: Warm, dry without rashes, lesions, ecchymosis.  Neuro: Awake and oriented X 3, Cranial nerves intact. No cerebellar symptoms. Decreased sensation bilateral feet Psych: normal affect, Insight and Judgment appropriate.    Vicie Mutters, PA-C 9:16 AM [facility]

## 2017-12-04 ENCOUNTER — Ambulatory Visit (INDEPENDENT_AMBULATORY_CARE_PROVIDER_SITE_OTHER): Payer: Medicare Other | Admitting: Physician Assistant

## 2017-12-04 ENCOUNTER — Encounter: Payer: Self-pay | Admitting: Physician Assistant

## 2017-12-04 VITALS — BP 132/74 | HR 100 | Temp 97.3°F | Resp 16 | Ht 73.5 in | Wt 312.6 lb

## 2017-12-04 DIAGNOSIS — Z23 Encounter for immunization: Secondary | ICD-10-CM

## 2017-12-04 DIAGNOSIS — E1142 Type 2 diabetes mellitus with diabetic polyneuropathy: Secondary | ICD-10-CM

## 2017-12-04 DIAGNOSIS — I10 Essential (primary) hypertension: Secondary | ICD-10-CM

## 2017-12-04 DIAGNOSIS — E559 Vitamin D deficiency, unspecified: Secondary | ICD-10-CM

## 2017-12-04 DIAGNOSIS — E782 Mixed hyperlipidemia: Secondary | ICD-10-CM

## 2017-12-04 DIAGNOSIS — Z79899 Other long term (current) drug therapy: Secondary | ICD-10-CM | POA: Diagnosis not present

## 2017-12-04 DIAGNOSIS — M79642 Pain in left hand: Secondary | ICD-10-CM

## 2017-12-04 DIAGNOSIS — Z0000 Encounter for general adult medical examination without abnormal findings: Secondary | ICD-10-CM | POA: Diagnosis not present

## 2017-12-04 DIAGNOSIS — E1169 Type 2 diabetes mellitus with other specified complication: Secondary | ICD-10-CM

## 2017-12-04 DIAGNOSIS — G2581 Restless legs syndrome: Secondary | ICD-10-CM

## 2017-12-04 DIAGNOSIS — Z136 Encounter for screening for cardiovascular disorders: Secondary | ICD-10-CM | POA: Diagnosis not present

## 2017-12-04 DIAGNOSIS — N182 Chronic kidney disease, stage 2 (mild): Secondary | ICD-10-CM

## 2017-12-04 DIAGNOSIS — E611 Iron deficiency: Secondary | ICD-10-CM

## 2017-12-04 DIAGNOSIS — I70209 Unspecified atherosclerosis of native arteries of extremities, unspecified extremity: Secondary | ICD-10-CM

## 2017-12-04 DIAGNOSIS — E785 Hyperlipidemia, unspecified: Secondary | ICD-10-CM

## 2017-12-04 DIAGNOSIS — M79641 Pain in right hand: Secondary | ICD-10-CM

## 2017-12-04 DIAGNOSIS — E662 Morbid (severe) obesity with alveolar hypoventilation: Secondary | ICD-10-CM

## 2017-12-04 DIAGNOSIS — M1A00X0 Idiopathic chronic gout, unspecified site, without tophus (tophi): Secondary | ICD-10-CM

## 2017-12-04 DIAGNOSIS — R2681 Unsteadiness on feet: Secondary | ICD-10-CM

## 2017-12-04 DIAGNOSIS — E1129 Type 2 diabetes mellitus with other diabetic kidney complication: Secondary | ICD-10-CM | POA: Diagnosis not present

## 2017-12-04 DIAGNOSIS — Z0001 Encounter for general adult medical examination with abnormal findings: Secondary | ICD-10-CM

## 2017-12-04 DIAGNOSIS — E538 Deficiency of other specified B group vitamins: Secondary | ICD-10-CM

## 2017-12-04 MED ORDER — ROSUVASTATIN CALCIUM 5 MG PO TABS: 5.0000 mg | ORAL_TABLET | Freq: Every day | ORAL | 3 refills | Status: DC

## 2017-12-04 NOTE — Patient Instructions (Addendum)
Try the flomax with dinner Increase water  Do the carpal tunnel brace from a pharmacy at night for 4-6 weeks, and we will refer to ortho for injections  Carpal Tunnel Syndrome  Carpal tunnel syndrome is a condition that causes pain in your hand and arm. The carpal tunnel is a narrow area located on the palm side of your wrist. Repeated wrist motion or certain diseases may cause swelling within the tunnel. This swelling pinches the main nerve in the wrist (median nerve). What are the causes? This condition may be caused by:  Repeated wrist motions.  Wrist injuries.  Arthritis.  A cyst or tumor in the carpal tunnel.  Fluid buildup during pregnancy.  Sometimes the cause of this condition is not known. What increases the risk? This condition is more likely to develop in:  People who have jobs that cause them to repeatedly move their wrists in the same motion, such as Art gallery manager.  Women.  People with certain conditions, such as: ? Diabetes. ? Obesity. ? An underactive thyroid (hypothyroidism). ? Kidney failure.  What are the signs or symptoms? Symptoms of this condition include:  A tingling feeling in your fingers, especially in your thumb, index, and middle fingers.  Tingling or numbness in your hand.  An aching feeling in your entire arm, especially when your wrist and elbow are bent for long periods of time.  Wrist pain that goes up your arm to your shoulder.  Pain that goes down into your palm or fingers.  A weak feeling in your hands. You may have trouble grabbing and holding items.  Your symptoms may feel worse during the night. How is this diagnosed? This condition is diagnosed with a medical history and physical exam. You may also have tests, including:  An electromyogram (EMG). This test measures electrical signals sent by your nerves into the muscles.  X-rays.  How is this treated? Treatment for this condition includes:  Lifestyle changes.  It is important to stop doing or modify the activity that caused your condition.  Physical or occupational therapy.  Medicines for pain and inflammation. This may include medicine that is injected into your wrist.  A wrist splint.  Surgery.  Follow these instructions at home: If you have a splint:  Wear it as told by your health care provider. Remove it only as told by your health care provider.  Loosen the splint if your fingers become numb and tingle, or if they turn cold and blue.  Keep the splint clean and dry. General instructions  Take over-the-counter and prescription medicines only as told by your health care provider.  Rest your wrist from any activity that may be causing your pain. If your condition is work related, talk to your employer about changes that can be made, such as getting a wrist pad to use while typing.  If directed, apply ice to the painful area: ? Put ice in a plastic bag. ? Place a towel between your skin and the bag. ? Leave the ice on for 20 minutes, 2-3 times per day.  Keep all follow-up visits as told by your health care provider. This is important.  Do any exercises as told by your health care provider, physical therapist, or occupational therapist. Contact a health care provider if:  You have new symptoms.  Your pain is not controlled with medicines.  Your symptoms get worse. This information is not intended to replace advice given to you by your health care provider. Make sure  you discuss any questions you have with your health care provider. Document Released: 12/31/1999 Document Revised: 05/13/2015 Document Reviewed: 05/20/2014 Elsevier Interactive Patient Education  [employer].     When it comes to diets, agreement about the perfect plan isn't easy to find, even among the experts. Experts at the [place] developed an idea known as the Healthy Eating Plate. Just imagine a plate divided into logical,  healthy portions.  The emphasis is on diet quality:  Load up on vegetables and fruits - one-half of your plate: Aim for color and variety, and remember that potatoes don't count.  Go for whole grains - one-quarter of your plate: Whole wheat, barley, wheat berries, quinoa, oats, brown rice, and foods made with them. If you want pasta, go with whole wheat pasta.  Protein power - one-quarter of your plate: Fish, chicken, beans, and nuts are all healthy, versatile protein sources. Limit red meat.  The diet, however, does go beyond the plate, offering a few other suggestions.  Use healthy plant oils, such as olive, canola, soy, corn, sunflower and peanut. Check the labels, and avoid partially hydrogenated oil, which have unhealthy trans fats.  If you're thirsty, drink water. Coffee and tea are good in moderation, but skip sugary drinks and limit milk and dairy products to one or two daily servings.  The type of carbohydrate in the diet is more important than the amount. Some sources of carbohydrates, such as vegetables, fruits, whole grains, and beans-are healthier than others.  Finally, stay active.  Are you an emotional eater? Do you eat more when you're feeling stressed? Do you eat when you're not hungry or when you're full? Do you eat to feel better (to calm and soothe yourself when you're sad, mad, bored, anxious, etc.)? Do you reward yourself with food? Do you regularly eat until you've stuffed yourself? Does food make you feel safe? Do you feel like food is a friend? Do you feel powerless or out of control around food?  If you answered yes to some of these questions than it is likely that you are an emotional eater. This is normally a learned behavior and can take time to first recognize the signs and second BREAK THE HABIT. But here is more information and tips to help.   The difference between emotional hunger and physical hunger Emotional hunger can be powerful, so it's easy to  mistake it for physical hunger. But there are clues you can look for to help you tell physical and emotional hunger apart.  Emotional hunger comes on suddenly. It hits you in an instant and feels overwhelming and urgent. Physical hunger, on the other hand, comes on more gradually. The urge to eat doesn't feel as dire or demand instant satisfaction (unless you haven't eaten for a very long time).  Emotional hunger craves specific comfort foods. When you're physically hungry, almost anything sounds good-including healthy stuff like vegetables. But emotional hunger craves junk food or sugary snacks that provide an instant rush. You feel like you need cheesecake or pizza, and nothing else will do.  Emotional hunger often leads to mindless eating. Before you know it, you've eaten a whole bag of chips or an entire pint of ice cream without really paying attention or fully enjoying it. When you're eating in response to physical hunger, you're typically more aware of what you're doing.  Emotional hunger isn't satisfied once you're full. You keep wanting more and more, often eating until you're uncomfortably  stuffed. Physical hunger, on the other hand, doesn't need to be stuffed. You feel satisfied when your stomach is full.  Emotional hunger isn't located in the stomach. Rather than a growling belly or a pang in your stomach, you feel your hunger as a craving you can't get out of your head. You're focused on specific textures, tastes, and smells.  Emotional hunger often leads to regret, guilt, or shame. When you eat to satisfy physical hunger, you're unlikely to feel guilty or ashamed because you're simply giving your body what it needs. If you feel guilty after you eat, it's likely because you know deep down that you're not eating for nutritional reasons.  Identify your emotional eating triggers What situations, places, or feelings make you reach for the comfort of food? Most emotional eating is linked to  unpleasant feelings, but it can also be triggered by positive emotions, such as rewarding yourself for achieving a goal or celebrating a holiday or happy event. Common causes of emotional eating include:  Stuffing emotions - Eating can be a way to temporarily silence or "stuff down" uncomfortable emotions, including anger, fear, sadness, anxiety, loneliness, resentment, and shame. While you're numbing yourself with food, you can avoid the difficult emotions you'd rather not feel.  Boredom or feelings of emptiness - Do you ever eat simply to give yourself something to do, to relieve boredom, or as a way to fill a void in your life? You feel unfulfilled and empty, and food is a way to occupy your mouth and your time. In the moment, it fills you up and distracts you from underlying feelings of purposelessness and dissatisfaction with your life.  Childhood habits - Think back to your childhood memories of food. Did your parents reward good behavior with ice cream, take you out for pizza when you got a good report card, or serve you sweets when you were feeling sad? These habits can often carry over into adulthood. Or your eating may be driven by nostalgia-for cherished memories of grilling burgers in the backyard with your dad or baking and eating cookies with your mom.  Social influences - Getting together with other people for a meal is a great way to relieve stress, but it can also lead to overeating. It's easy to overindulge simply because the food is there or because everyone else is eating. You may also overeat in social situations out of nervousness. Or perhaps your family or circle of friends encourages you to overeat, and it's easier to go along with the group.  Stress - Ever notice how stress makes you hungry? It's not just in your mind. When stress is chronic, as it so often is in our chaotic, fast-paced world, your body produces high levels of the stress hormone, cortisol. Cortisol triggers  cravings for salty, sweet, and fried foods-foods that give you a burst of energy and pleasure. The more uncontrolled stress in your life, the more likely you are to turn to food for emotional relief.  Find other ways to feed your feelings If you don't know how to manage your emotions in a way that doesn't involve food, you won't be able to control your eating habits for very long. Diets so often fail because they offer logical nutritional advice which only works if you have conscious control over your eating habits. It doesn't work when emotions hijack the process, demanding an immediate payoff with food.  In order to stop emotional eating, you have to find other ways to fulfill yourself  emotionally. It's not enough to understand the cycle of emotional eating or even to understand your triggers, although that's a huge first step. You need alternatives to food that you can turn to for emotional fulfillment.  Alternatives to emotional eating If you're depressed or lonely, call someone who always makes you feel better, play with your dog or cat, or look at a favorite photo or cherished memento.  If you're anxious, expend your nervous energy by dancing to your favorite song, squeezing a stress ball, or taking a brisk walk.  If you're exhausted, treat yourself with a hot cup of tea, take a bath, light some scented candles, or wrap yourself in a warm blanket.  If you're bored, read a good book, watch a comedy show, explore the outdoors, or turn to an activity you enjoy (woodworking, playing the guitar, shooting hoops, scrapbooking, etc.).  What is mindful eating? Mindful eating is a practice that develops your awareness of eating habits and allows you to pause between your triggers and your actions. Most emotional eaters feel powerless over their food cravings. When the urge to eat hits, you feel an almost unbearable tension that demands to be fed, right now. Because you've tried to resist in the past and  failed, you believe that your willpower just isn't up to snuff. But the truth is that you have more power over your cravings than you think.  Take 5 before you give in to a craving Emotional eating tends to be automatic and virtually mindless. Before you even realize what you're doing, you've reached for a tub of ice cream and polished off half of it. But if you can take a moment to pause and reflect when you're hit with a craving, you give yourself the opportunity to make a different decision.  Can you put off eating for five minutes? Or just start with one minute. Don't tell yourself you can't give in to the craving; remember, the forbidden is extremely tempting. Just tell yourself to wait.  While you're waiting, check in with yourself. How are you feeling? What's going on emotionally? Even if you end up eating, you'll have a better understanding of why you did it. This can help you set yourself up for a different response next time.  How to practice mindful eating Eating while you're also doing other things-such as watching TV, driving, or playing with your phone-can prevent you from fully enjoying your food. Since your mind is elsewhere, you may not feel satisfied or continue eating even though you're no longer hungry. Eating more mindfully can help focus your mind on your food and the pleasure of a meal and curb overeating.   Eat your meals in a calm place with no distractions, aside from any dining companions.  Try eating with your non-dominant hand or using chopsticks instead of a knife and fork. Eating in such a non-familiar way can slow down how fast you eat and ensure your mind stays focused on your food.  Allow yourself enough time not to have to rush your meal. Set a timer for 20 minutes and pace yourself so you spend at least that much time eating.  Take small bites and chew them well, taking time to notice the different flavors and textures of each mouthful.  Put your utensils down  between bites. Take time to consider how you feel-hungry, satiated-before picking up your utensils again.  Try to stop eating before you are full.It takes time for the signal to reach your brain  that you've had enough. Don't feel obligated to always clean your plate.  When you've finished your food, take a few moments to assess if you're really still hungry before opting for an extra serving or dessert.  Learn to accept your feelings-even the bad ones  While it may seem that the core problem is that you're powerless over food, emotional eating actually stems from feeling powerless over your emotions. You don't feel capable of dealing with your feelings head on, so you avoid them with food.  Recommended reading  Mini Habits for weight loss  Healthy Eating: A guide to the new nutrition - [place] Report  10 Tips for Mindful Eating - How mindfulness can help you fully enjoy a meal and the experience of eating-with moderation and restraint. ([place])  Weight Loss: Gain Control of Emotional Eating - Tips to regain control of your eating habits. [facility])  Why Stress Causes People to Overeat -Tips on controlling stress eating. ([place])  Mindful Eating Meditations -Free online mindfulness meditations. ([facility] [employer])

## 2017-12-05 LAB — LIPID PANEL
Cholesterol: 160 mg/dL (ref <200)
HDL: 30 mg/dL — ABNORMAL LOW (ref >40)
LDL Cholesterol (Calc): 96 mg/dL (calc)
Non-HDL Cholesterol (Calc): 130 mg/dL (calc) — ABNORMAL HIGH (ref <130)
Total CHOL/HDL Ratio: 5.3 (calc) — ABNORMAL HIGH (ref <5.0)
Triglycerides: 225 mg/dL — ABNORMAL HIGH (ref <150)

## 2017-12-05 LAB — HEMOGLOBIN A1C
Hgb A1c MFr Bld: 7.4 % of total Hgb — ABNORMAL HIGH (ref <5.7)
Mean Plasma Glucose: 166 (calc)
eAG (mmol/L): 9.2 (calc)

## 2017-12-05 LAB — COMPLETE METABOLIC PANEL WITH GFR
AG Ratio: 2 (calc) (ref 1.0–2.5)
ALT: 15 U/L (ref 9–46)
AST: 19 U/L (ref 10–35)
Albumin: 4.3 g/dL (ref 3.6–5.1)
Alkaline phosphatase (APISO): 66 U/L (ref 40–115)
BUN: 17 mg/dL (ref 7–25)
CO2: 25 mmol/L (ref 20–32)
Calcium: 9.5 mg/dL (ref 8.6–10.3)
Chloride: 104 mmol/L (ref 98–110)
Creat: 0.95 mg/dL (ref 0.70–1.25)
GFR, Est African American: 98 mL/min/{1.73_m2} (ref >=60)
GFR, Est Non African American: 84 mL/min/{1.73_m2} (ref >=60)
Globulin: 2.2 g/dL (calc) (ref 1.9–3.7)
Glucose, Bld: 161 mg/dL — ABNORMAL HIGH (ref 65–99)
Potassium: 4.6 mmol/L (ref 3.5–5.3)
Sodium: 138 mmol/L (ref 135–146)
Total Bilirubin: 0.5 mg/dL (ref 0.2–1.2)
Total Protein: 6.5 g/dL (ref 6.1–8.1)

## 2017-12-05 LAB — CBC WITH DIFFERENTIAL/PLATELET
Basophils Absolute: 61 cells/uL (ref 0–200)
Basophils Relative: 0.9 %
Eosinophils Absolute: 102 cells/uL (ref 15–500)
Eosinophils Relative: 1.5 %
HCT: 38.5 % (ref 38.5–50.0)
Hemoglobin: 13 g/dL — ABNORMAL LOW (ref 13.2–17.1)
Lymphs Abs: 1272 cells/uL (ref 850–3900)
MCH: 29.4 pg (ref 27.0–33.0)
MCHC: 33.8 g/dL (ref 32.0–36.0)
MCV: 87.1 fL (ref 80.0–100.0)
MPV: 10.2 fL (ref 7.5–12.5)
Monocytes Relative: 7.1 %
Neutro Abs: 4882 cells/uL (ref 1500–7800)
Neutrophils Relative %: 71.8 %
Platelets: 285 10*3/uL (ref 140–400)
RBC: 4.42 10*6/uL (ref 4.20–5.80)
RDW: 14.8 % (ref 11.0–15.0)
Total Lymphocyte: 18.7 %
WBC mixed population: 483 cells/uL (ref 200–950)
WBC: 6.8 10*3/uL (ref 3.8–10.8)

## 2017-12-05 LAB — TSH: TSH: 1.78 mIU/L (ref 0.40–4.50)

## 2017-12-05 LAB — URINALYSIS, ROUTINE W REFLEX MICROSCOPIC
Bilirubin Urine: NEGATIVE
Glucose, UA: NEGATIVE
Hgb urine dipstick: NEGATIVE
Ketones, ur: NEGATIVE
Leukocytes, UA: NEGATIVE
Nitrite: NEGATIVE
Protein, ur: NEGATIVE
Specific Gravity, Urine: 1.019 (ref 1.001–1.03)
pH: 6 (ref 5.0–8.0)

## 2017-12-05 LAB — MAGNESIUM: Magnesium: 1.6 mg/dL (ref 1.5–2.5)

## 2017-12-05 LAB — MICROALBUMIN / CREATININE URINE RATIO
Creatinine, Urine: 108 mg/dL (ref 20–320)
Microalb Creat Ratio: 25 mcg/mg creat (ref <30)
Microalb, Ur: 2.7 mg/dL

## 2017-12-05 LAB — VITAMIN D 25 HYDROXY (VIT D DEFICIENCY, FRACTURES): Vit D, 25-Hydroxy: 98 ng/mL (ref 30–100)

## 2017-12-05 LAB — IRON, TOTAL/TOTAL IRON BINDING CAP
%SAT: 18 % (calc) — ABNORMAL LOW (ref 20–48)
Iron: 67 ug/dL (ref 50–180)
TIBC: 368 mcg/dL (calc) (ref 250–425)

## 2017-12-05 LAB — VITAMIN B12: Vitamin B-12: 403 pg/mL (ref 200–1100)

## 2017-12-26 ENCOUNTER — Telehealth: Payer: Self-pay

## 2017-12-26 DIAGNOSIS — H524 Presbyopia: Secondary | ICD-10-CM | POA: Diagnosis not present

## 2017-12-26 DIAGNOSIS — E119 Type 2 diabetes mellitus without complications: Secondary | ICD-10-CM | POA: Diagnosis not present

## 2017-12-26 LAB — HM DIABETES EYE EXAM

## 2017-12-26 NOTE — Telephone Encounter (Signed)
Pt requests JANUVIA 100 mgs for both he and his wife, Samples were placed up front for pick up and pt was called & informed of this request. Dec 11th 2019 at 11:34am.

## 2017-12-28 DIAGNOSIS — G5603 Carpal tunnel syndrome, bilateral upper limbs: Secondary | ICD-10-CM | POA: Insufficient documentation

## 2017-12-28 DIAGNOSIS — M13842 Other specified arthritis, left hand: Secondary | ICD-10-CM | POA: Diagnosis not present

## 2017-12-28 DIAGNOSIS — M13841 Other specified arthritis, right hand: Secondary | ICD-10-CM | POA: Diagnosis not present

## 2018-01-02 ENCOUNTER — Encounter: Payer: Self-pay | Admitting: *Deleted

## 2018-01-07 DIAGNOSIS — G5623 Lesion of ulnar nerve, bilateral upper limbs: Secondary | ICD-10-CM | POA: Diagnosis not present

## 2018-01-07 DIAGNOSIS — G5603 Carpal tunnel syndrome, bilateral upper limbs: Secondary | ICD-10-CM | POA: Diagnosis not present

## 2018-01-16 ENCOUNTER — Other Ambulatory Visit: Payer: Self-pay | Admitting: Internal Medicine

## 2018-01-24 DIAGNOSIS — G5622 Lesion of ulnar nerve, left upper limb: Secondary | ICD-10-CM | POA: Diagnosis not present

## 2018-01-24 DIAGNOSIS — G5603 Carpal tunnel syndrome, bilateral upper limbs: Secondary | ICD-10-CM | POA: Diagnosis not present

## 2018-01-29 ENCOUNTER — Ambulatory Visit (INDEPENDENT_AMBULATORY_CARE_PROVIDER_SITE_OTHER): Payer: Medicare Other | Admitting: Podiatry

## 2018-01-29 ENCOUNTER — Encounter: Payer: Self-pay | Admitting: Podiatry

## 2018-01-29 DIAGNOSIS — Q828 Other specified congenital malformations of skin: Secondary | ICD-10-CM | POA: Diagnosis not present

## 2018-01-29 DIAGNOSIS — B351 Tinea unguium: Secondary | ICD-10-CM | POA: Diagnosis not present

## 2018-01-29 DIAGNOSIS — E1142 Type 2 diabetes mellitus with diabetic polyneuropathy: Secondary | ICD-10-CM | POA: Diagnosis not present

## 2018-01-29 DIAGNOSIS — M79676 Pain in unspecified toe(s): Secondary | ICD-10-CM

## 2018-01-29 NOTE — Progress Notes (Signed)
He presents today chief complaint of painful elongated toenails reactive painful calluses plantar aspect of the foot and numbness and tingling in his feet.  Objective: Vital signs are stable he is alert oriented x3 pulses are strong palpable.  Neurologic sensorium is diminished to the toes per Semmes Weinstein monofilament.  Digital deformities flexible in nature with thick yellow dystrophic onychomycotic nails which are painful and elongated.  Reactive hyper keratomas plantar aspect of the bilateral foot porokeratotic lesions.  Assessment: Pain in limb secondary to diabetic peripheral neuropathy onychomycosis and porokeratosis.  Plan: Discussed etiology pathology and surgical therapies at this point time debrided all reactive hyperkeratotic tissue and nails.

## 2018-01-30 ENCOUNTER — Encounter: Payer: Self-pay | Admitting: Adult Health

## 2018-01-30 ENCOUNTER — Ambulatory Visit (INDEPENDENT_AMBULATORY_CARE_PROVIDER_SITE_OTHER): Payer: Medicare Other | Admitting: Adult Health

## 2018-01-30 VITALS — BP 110/76 | HR 99 | Temp 97.6°F | Ht 73.5 in | Wt 305.0 lb

## 2018-01-30 DIAGNOSIS — J0141 Acute recurrent pansinusitis: Secondary | ICD-10-CM

## 2018-01-30 MED ORDER — PROMETHAZINE-DM 6.25-15 MG/5ML PO SYRP: 5.0000 mL | ORAL_SOLUTION | Freq: Four times a day (QID) | ORAL | 1 refills | Status: DC | PRN

## 2018-01-30 MED ORDER — AZITHROMYCIN 250 MG PO TABS: ORAL_TABLET | ORAL | 1 refills | Status: AC

## 2018-01-30 MED ORDER — PREDNISONE 20 MG PO TABS: ORAL_TABLET | ORAL | 0 refills | Status: DC

## 2018-01-30 NOTE — Progress Notes (Signed)
Assessment and Plan:  Jason Wyatt was seen today for acute visit.  Diagnoses and all orders for this visit:  Acute recurrent pansinusitis Will treat aggressively with goal to fully resolve prior to upcoming surgery Suggested symptomatic OTC remedies. Nasal saline spray for congestion. Nasal steroids, rotate allergy pill, oral steroids offered Continue with vigilant cleaning/maintenance of CPAP supplies Follow up as needed. -     azithromycin (ZITHROMAX) 250 MG tablet; Take 2 tablets (500 mg) on  Day 1,  followed by 1 tablet (250 mg) once daily on Days 2 through 5. -     predniSONE (DELTASONE) 20 MG tablet; 2 tablets daily for 3 days, 1 tablet daily for 4 days. -     promethazine-dextromethorphan (PROMETHAZINE-DM) 6.25-15 MG/5ML syrup; Take 5 mLs by mouth 4 (four) times daily as needed for cough.  Further disposition pending results of labs. Discussed med's effects and SE's.   Over 15 minutes of exam, counseling, chart review, and critical decision making was performed.   Future Appointments  Date Time Provider [place]  03/25/2018 10:30 AM Unk Pinto, MD [facility] None  05/07/2018  8:15 AM Tyson Dense T, DPM [facility]  07/29/2018 11:15 AM Liane Comber, NP [facility] None  12/16/2018  9:00 AM Vicie Mutters, PA-C [facility] None    ------------------------------------------------------------------------------------------------------------------   HPI BP 110/76   Pulse 99   Temp 97.6 F (36.4 C)   Ht 6' 1.5" (1.867 m)   Wt (!) 305 lb (138.3 kg)   SpO2 98%   BMI 39.69 kg/m   63 y.o.male with T2DM, htn, obesity hypoventilation syndrome on CPAP presents for evaluation of URI symptoms; he reports 7 days of sinus pressure, nasal congestion, frontal/sinus headaches that persist. He endorses scratchy throat without soreness, and productive cough that is persistent. Denies fever/chills.   He reports chronic recurrent sinus problems, has discussed injections for  allergies but opted not to do this. Declined sinus surgery. He takes allegra daily, has been taking mucinex. Hasn't tried saline irrigations or flonase though has used these in the past.   He is concerned due to upcoming carpal tunnel surgery scheduled for 02/20/2018.   He wears CPAP faithfully, reports he does clean machine regularly   Past Medical History:  Diagnosis Date  . Acute meniscal tear of left knee   . Allergic rhinitis   . Arthritis     back  . Borderline glaucoma   . Chronic back pain    stenosis  . CKD (chronic kidney disease), stage II   . Complication of anesthesia cervical fusion surgery 06/2010   "woke up next day w/ventilator on" told due to OSA  . GERD (gastroesophageal reflux disease)    takes Prevacid daily  . Gout    takes Allopurinol daily and Colchicine if needed  . History of adenomatous polyp of colon    tubular adenoma's  . History of Barrett's esophagus   . History of hiatal hernia   . Hyperlipidemia   . Hypertension   . Incomplete right bundle branch block (RBBB)   . OA (osteoarthritis)    knee  . OSA on CPAP   . Peripheral neuropathy   . Prostate cancer [facility]) urologist-  dr Alinda Money   dx 2013  s/p  radical non-nerve sparing prostatectomy 2014--  Stage pT2cNx,  PSA 3.48,  Gleason 3+3,  vol 55cc/  current PSA  0.01 (Aug 2016)  . Thyroid nodule    left side  . Type II diabetes mellitus ([place])   . Urinary frequency  takes Flomax daily  . Urine incontinence   . Wears glasses      Allergies  Allergen Reactions  . Flaxseed [Linseed Oil] Rash  . Lyrica [Pregabalin] Itching and Rash  . Niaspan [Niacin Er] Rash    Current Outpatient Medications on File Prior to Visit  Medication Sig  . acetaminophen (TYLENOL) 500 MG tablet Take 500 mg by mouth every 6 (six) hours as needed for moderate pain.  [name] acyclovir (ZOVIRAX) 400 MG tablet TAKE 1 TABLET BY MOUTH  DAILY AS NEEDED FOR COLD  SORES  . allopurinol (ZYLOPRIM) 300 MG tablet TAKE 1 TABLET BY MOUTH  AT  BEDTIME  . carbidopa-levodopa (SINEMET IR) 25-250 MG tablet TAKE 1 TABLET BY MOUTH TWO  TIMES DAILY FOR RESTLESS  LEGS  . Docusate Calcium (STOOL SOFTENER PO) Take by mouth daily.  . DULoxetine (CYMBALTA) 60 MG capsule TAKE 1 CAPSULE BY MOUTH AT  BEDTIME  . enalapril (VASOTEC) 20 MG tablet TAKE 1 TABLET BY MOUTH  TWICE A DAY  . fexofenadine (ALLEGRA) 180 MG tablet Take 180 mg by mouth daily as needed for allergies.   [name] gabapentin (NEURONTIN) 300 MG capsule TAKE TWO CAPSULES BY MOUTH  THREE TIMES DAILY.  [name] glucose blood (ONE TOUCH ULTRA TEST) test strip CHECK BLOOD SUGAR 1 TIME  DAILY  . JANUVIA 100 MG tablet TAKE 1 TABLET BY MOUTH  DAILY  . Magnesium 500 MG CAPS Take 1,500 mg by mouth every evening.   . meloxicam (MOBIC) 15 MG tablet Take 15 mg by mouth daily as needed for pain.   . metFORMIN (GLUCOPHAGE) 1000 MG tablet TAKE 1 TABLET BY MOUTH  TWICE A DAY WITH MEALS  . methocarbamol (ROBAXIN) 500 MG tablet Take 1 tablet (500 mg total) by mouth daily as needed for muscle spasms.  . Multiple Vitamin (MULTIVITAMIN) tablet Take 1 tablet by mouth daily.  [name] omega-3 acid ethyl esters (LOVAZA) 1 g capsule TAKE 2 BY MOUTH TWICE DAILY  . ranitidine (ZANTAC) 300 MG tablet Take 1 tablet at Bedtime for Acid Reflux & Indigestion  . rosuvastatin (CRESTOR) 5 MG tablet Take 1 tablet (5 mg total) by mouth at bedtime.  . tamsulosin (FLOMAX) 0.4 MG CAPS capsule TAKE 1 CAPSULE BY MOUTH  DAILY AFTER SUPPER  . Vitamin D, Ergocalciferol, (DRISDOL) 50000 units CAPS capsule Take 1 capsule (50,000 Units total) by mouth as directed. Tuesday , Wednesday, Thursday, Sunday (Patient taking differently: Take 50,000 Units by mouth 2 (two) times a week. Wednesday and Sunday)   No current facility-administered medications on file prior to visit.     ROS: all negative except above.   Physical Exam:  BP 110/76   Pulse 99   Temp 97.6 F (36.4 C)   Ht 6' 1.5" (1.867 m)   Wt (!) 305 lb (138.3 kg)   SpO2 98%   BMI 39.69  kg/m   General Appearance: Well nourished, in no apparent distress. Eyes: PERRLA, conjunctiva no swelling or erythema Sinuses: No distinct Frontal/maxillary tenderness, reports generalized "pressure" to palpation ENT/Mouth: Ext aud canals clear, TMs without erythema, bulging. No erythema, swelling, or exudate on post pharynx.  Tonsils not swollen or erythematous. Hearing normal. Poor dentition, oral health, with halitosis Neck: Supple Respiratory: Respiratory effort normal, BS equal bilaterally without rales, rhonchi, wheezing or stridor.  Cardio: RRR with no MRGs. Brisk peripheral pulses without edema.  Abdomen: Soft, + BS.  Non tender. Lymphatics: Non tender without lymphadenopathy.  Musculoskeletal: normal gait.  Skin: Warm, dry without rashes, lesions,  ecchymosis.  Neuro: Cranial nerves intact. Normal muscle tone. Psych: Awake and oriented X 3, normal affect, Insight and Judgment appropriate.     Izora Ribas, NP 11:35 AM [facility]

## 2018-01-30 NOTE — Patient Instructions (Addendum)
Try senokot to help bowels "move" - not a stool softener, can take both IF needed  Try doing saline nasal irrigations after coming in from the outdoors  Can try adding a steroid nasal spray - try flonase or fluticasone - or can ask phamacist for similar alternatives    Sinusitis, Adult Sinusitis is soreness and swelling (inflammation) of your sinuses. Sinuses are hollow spaces in the bones around your face. They are located:  Around your eyes.  In the middle of your forehead.  Behind your nose.  In your cheekbones. Your sinuses and nasal passages are lined with a fluid called mucus. Mucus drains out of your sinuses. Swelling can trap mucus in your sinuses. This lets germs (bacteria, virus, or fungus) grow, which leads to infection. Most of the time, this condition is caused by a virus. What are the causes? This condition is caused by:  Allergies.  Asthma.  Germs.  Things that block your nose or sinuses.  Growths in the nose (nasal polyps).  Chemicals or irritants in the air.  Fungus (rare). What increases the risk? You are more likely to develop this condition if:  You have a weak body defense system (immune system).  You do a lot of swimming or diving.  You use nasal sprays too much.  You smoke. What are the signs or symptoms? The main symptoms of this condition are pain and a feeling of pressure around the sinuses. Other symptoms include:  Stuffy nose (congestion).  Runny nose (drainage).  Swelling and warmth in the sinuses.  Headache.  Toothache.  A cough that may get worse at night.  Mucus that collects in the throat or the back of the nose (postnasal drip).  Being unable to smell and taste.  Being very tired (fatigue).  A fever.  Sore throat.  Bad breath. How is this diagnosed? This condition is diagnosed based on:  Your symptoms.  Your medical history.  A physical exam.  Tests to find out if your condition is short-term (acute) or  long-term (chronic). Your doctor may: ? Check your nose for growths (polyps). ? Check your sinuses using a tool that has a light (endoscope). ? Check for allergies or germs. ? Do imaging tests, such as an MRI or CT scan. How is this treated? Treatment for this condition depends on the cause and whether it is short-term or long-term.  If caused by a virus, your symptoms should go away on their own within 10 days. You may be given medicines to relieve symptoms. They include: ? Medicines that shrink swollen tissue in the nose. ? Medicines that treat allergies (antihistamines). ? A spray that treats swelling of the nostrils. ? Rinses that help get rid of thick mucus in your nose (nasal saline washes).  If caused by bacteria, your doctor may wait to see if you will get better without treatment. You may be given antibiotic medicine if you have: ? A very bad infection. ? A weak body defense system.  If caused by growths in the nose, you may need to have surgery. Follow these instructions at home: Medicines  Take, use, or apply over-the-counter and prescription medicines only as told by your doctor. These may include nasal sprays.  If you were prescribed an antibiotic medicine, take it as told by your doctor. Do not stop taking the antibiotic even if you start to feel better. Hydrate and humidify   Drink enough water to keep your pee (urine) pale yellow.  Use a cool mist  humidifier to keep the humidity level in your home above 50%.  Breathe in steam for 10-15 minutes, 3-4 times a day, or as told by your doctor. You can do this in the bathroom while a hot shower is running.  Try not to spend time in cool or dry air. Rest  Rest as much as you can.  Sleep with your head raised (elevated).  Make sure you get enough sleep each night. General instructions   Put a warm, moist washcloth on your face 3-4 times a day, or as often as told by your doctor. This will help with  discomfort.  Wash your hands often with soap and water. If there is no soap and water, use hand sanitizer.  Do not smoke. Avoid being around people who are smoking (secondhand smoke).  Keep all follow-up visits as told by your doctor. This is important. Contact a doctor if:  You have a fever.  Your symptoms get worse.  Your symptoms do not get better within 10 days. Get help right away if:  You have a very bad headache.  You cannot stop throwing up (vomiting).  You have very bad pain or swelling around your face or eyes.  You have trouble seeing.  You feel confused.  Your neck is stiff.  You have trouble breathing. Summary  Sinusitis is swelling of your sinuses. Sinuses are hollow spaces in the bones around your face.  This condition is caused by tissues in your nose that become inflamed or swollen. This traps germs. These can lead to infection.  If you were prescribed an antibiotic medicine, take it as told by your doctor. Do not stop taking it even if you start to feel better.  Keep all follow-up visits as told by your doctor. This is important. This information is not intended to replace advice given to you by your health care provider. Make sure you discuss any questions you have with your health care provider. Document Released: 06/21/2007 Document Revised: 06/04/2017 Document Reviewed: 06/04/2017 Elsevier Interactive Patient Education  2019 [employer].

## 2018-02-05 ENCOUNTER — Other Ambulatory Visit: Payer: Self-pay | Admitting: Internal Medicine

## 2018-02-16 ENCOUNTER — Other Ambulatory Visit: Payer: Self-pay | Admitting: Physician Assistant

## 2018-02-21 ENCOUNTER — Telehealth: Payer: Self-pay | Admitting: *Deleted

## 2018-02-21 NOTE — Telephone Encounter (Signed)
   [facility] Pre-operative Risk Assessment    Request for surgical clearance:  1. What type of surgery is being performed? LEFT ELBOW ULNAR NERVE RELEASE AND/OR TRANSPOSITION AND LEFT HAND CARPAL TUNNEL RELEASE   2. When is this surgery scheduled? TBD  3. What type of clearance is required (medical clearance vs. Pharmacy clearance to hold med vs. Both)? MEDICAL  4. Are there any medications that need to be held prior to surgery and how long? N/A  5. Practice name and name of physician performing surgery? [facility]  6. What is your office phone number [phone]   7.   What is your office fax number [phone]  8.   Anesthesia type (None, local, MAC, general) ? GENERAL   Devra Dopp 02/21/2018, 10:18 AM  _________________________________________________________________   (provider comments below)

## 2018-02-25 NOTE — Telephone Encounter (Signed)
Dr. Gwenlyn Found, please review. Last seen in 2017 for preop clearance at the time. Your note mentioned clean cath in 2013, no CAD. Followup as needed. It has been more than 2 years since he was last seen. Do you still recommend a cardiology visit for this preop as well?

## 2018-02-25 NOTE — Telephone Encounter (Signed)
This is a low risk operation.  He does not need to be seen by cardiology for clearance he is low risk.

## 2018-02-26 NOTE — Progress Notes (Deleted)
Cardiology Office Note   Date:  02/26/2018   ID:  [name], DOB 24-Dec-1955, MRN [id number]  PCP:  Unk Pinto, MD  Cardiologist:   No chief complaint on file.    History of Present Illness: Jason Wyatt is a 63 y.o. male who presents for ***    Past Medical History:  Diagnosis Date  . Acute meniscal tear of left knee   . Allergic rhinitis   . Arthritis     back  . Borderline glaucoma   . Chronic back pain    stenosis  . CKD (chronic kidney disease), stage II   . Complication of anesthesia cervical fusion surgery 06/2010   "woke up next day w/ventilator on" told due to OSA  . GERD (gastroesophageal reflux disease)    takes Prevacid daily  . Gout    takes Allopurinol daily and Colchicine if needed  . History of adenomatous polyp of colon    tubular adenoma's  . History of Barrett's esophagus   . History of hiatal hernia   . Hyperlipidemia   . Hypertension   . Incomplete right bundle branch block (RBBB)   . OA (osteoarthritis)    knee  . OSA on CPAP   . Peripheral neuropathy   . Prostate cancer [facility]) urologist-  dr Alinda Money   dx 2013  s/p  radical non-nerve sparing prostatectomy 2014--  Stage pT2cNx,  PSA 3.48,  Gleason 3+3,  vol 55cc/  current PSA  0.01 (Aug 2016)  . Thyroid nodule    left side  . Type II diabetes mellitus ([place])   . Urinary frequency    takes Flomax daily  . Urine incontinence   . Wears glasses     Past Surgical History:  Procedure Laterality Date  . ACHILLES TENDON SURGERY Left 06/27/2017   Procedure: ACHILLES TENDON REPAIR;  Surgeon: Evelina Bucy, DPM;  Location: [facility];  Service: Podiatry;  Laterality: Left;  . ANAL FISSURE REPAIR  06-05-2002  . BACK SURGERY    . CARDIAC CATHETERIZATION    . CARDIOVASCULAR STRESS TEST  01-01-2008   normal nuclear study/  no ischemia/  normal LV function and wall motion , 57%  . ESOPHAGOGASTRODUODENOSCOPY  09/26/2011   Procedure: ESOPHAGOGASTRODUODENOSCOPY (EGD);  Surgeon: Cleotis Nipper, MD;   Location: [facility];  Service: Endoscopy;  Laterality: N/A;  . GASTROC RECESSION EXTREMITY Left 06/27/2017   Procedure: GASTROC RECESSION EXTREMITY;  Surgeon: Evelina Bucy, DPM;  Location: [facility];  Service: Podiatry;  Laterality: Left;  . KNEE ARTHROSCOPY WITH MEDIAL MENISECTOMY Left 12/31/2014   Procedure: LEFT KNEE ARTHROSCOPY, PARTIAL MEDIAL AND LATERAL  MENISECTOMIES WITH [place];  Surgeon: Susa Day, MD;  Location: [place];  Service: Orthopedics;  Laterality: Left;  . LEFT AND RIGHT HEART CATHETERIZATION WITH CORONARY ANGIOGRAM N/A 08/25/2011   Procedure: LEFT AND RIGHT HEART CATHETERIZATION WITH CORONARY ANGIOGRAM;  Surgeon: Peter M Martinique, MD;  Location: [facility];  Service: Cardiovascular;  Laterality: N/A;   Normal coronary arteries and LVF, ef 55-60%  . POSTERIOR FUSION CERVICAL SPINE  07-05-2010   C1 --  C4  . POSTERIOR LUMBAR FUSION  05-18-2014   L4 -- S1  . ROBOT ASSISTED LAPAROSCOPIC RADICAL PROSTATECTOMY  02/07/2012   Procedure: ROBOTIC ASSISTED LAPAROSCOPIC RADICAL PROSTATECTOMY LEVEL 1;  Surgeon: Molli Hazard, MD;  Location: [facility];  Service: Urology;  Laterality: N/A;     . TENDON TRANSFER Left 06/27/2017   Procedure: TENDON TRANSFER;  Surgeon: Evelina Bucy,  DPM;  Location: [facility];  Service: Podiatry;  Laterality: Left;  . TOTAL KNEE ARTHROPLASTY Left 08/05/2015   Procedure: LEFT TOTAL KNEE ARTHROPLASTY;  Surgeon: Susa Day, MD;  Location: [facility];  Service: Orthopedics;  Laterality: Left;  . TRANSTHORACIC ECHOCARDIOGRAM  08-03-2011   mild LVH, ef 55-60%/  trivial TR and PR  . UVULOPALATOPHARYNGOPLASTY (UPPP)/TONSILLECTOMY/SEPTOPLASTY  1990's   and Adenoidectomy  . wisdom teeth extracted   1982     Current Outpatient Medications  Medication Sig Dispense Refill  . ACCU-CHEK SOFTCLIX LANCETS lancets USE WITH METER TO CHECK  BLOOD SUGAR ONCE A DAY 100 each 3  . acetaminophen (TYLENOL) 500 MG tablet Take 500 mg by mouth every 6  (six) hours as needed for moderate pain.    [name] acyclovir (ZOVIRAX) 400 MG tablet TAKE 1 TABLET BY MOUTH  DAILY AS NEEDED FOR COLD  SORES 90 tablet 3  . allopurinol (ZYLOPRIM) 300 MG tablet TAKE 1 TABLET BY MOUTH AT  BEDTIME 90 tablet 1  . carbidopa-levodopa (SINEMET IR) 25-250 MG tablet TAKE 1 TABLET BY MOUTH TWO  TIMES DAILY FOR RESTLESS  LEGS 180 tablet 1  . Docusate Calcium (STOOL SOFTENER PO) Take by mouth daily.    . DULoxetine (CYMBALTA) 60 MG capsule TAKE 1 CAPSULE BY MOUTH AT  BEDTIME 90 capsule 3  . enalapril (VASOTEC) 20 MG tablet TAKE 1 TABLET BY MOUTH  TWICE A DAY 180 tablet 1  . fexofenadine (ALLEGRA) 180 MG tablet Take 180 mg by mouth daily as needed for allergies.     [name] gabapentin (NEURONTIN) 300 MG capsule TAKE TWO CAPSULES BY MOUTH  THREE TIMES DAILY. 540 capsule 1  . glucose blood test strip Check glucose daily 100 each 3  . JANUVIA 100 MG tablet TAKE 1 TABLET BY MOUTH  DAILY 90 tablet 1  . Magnesium 500 MG CAPS Take 1,500 mg by mouth every evening.     . meloxicam (MOBIC) 15 MG tablet Take 15 mg by mouth daily as needed for pain.   1  . metFORMIN (GLUCOPHAGE) 1000 MG tablet TAKE 1 TABLET BY MOUTH  TWICE A DAY WITH MEALS 180 tablet 3  . methocarbamol (ROBAXIN) 500 MG tablet Take 1 tablet (500 mg total) by mouth daily as needed for muscle spasms.    . Multiple Vitamin (MULTIVITAMIN) tablet Take 1 tablet by mouth daily.    [name] omega-3 acid ethyl esters (LOVAZA) 1 g capsule TAKE 2 BY MOUTH TWICE DAILY 360 capsule 3  . pantoprazole (PROTONIX) 40 MG tablet TAKE 1 TABLET BY MOUTH  DAILY 90 tablet 3  . predniSONE (DELTASONE) 20 MG tablet 2 tablets daily for 3 days, 1 tablet daily for 4 days. 10 tablet 0  . promethazine-dextromethorphan (PROMETHAZINE-DM) 6.25-15 MG/5ML syrup Take 5 mLs by mouth 4 (four) times daily as needed for cough. 240 mL 1  . ranitidine (ZANTAC) 300 MG tablet Take 1 tablet at Bedtime for Acid Reflux & Indigestion 90 tablet 3  . rosuvastatin (CRESTOR) 5 MG tablet Take  1 tablet (5 mg total) by mouth at bedtime. 90 tablet 3  . tamsulosin (FLOMAX) 0.4 MG CAPS capsule TAKE 1 CAPSULE BY MOUTH  DAILY AFTER SUPPER 90 capsule 3  . Vitamin D, Ergocalciferol, (DRISDOL) 50000 units CAPS capsule Take 1 capsule (50,000 Units total) by mouth as directed. Tuesday , Wednesday, Thursday, Sunday (Patient taking differently: Take 50,000 Units by mouth 2 (two) times a week. Wednesday and Sunday) 90 capsule 1   No current facility-administered medications for  this visit.     Allergies:   Flaxseed [linseed oil]; Lyrica [pregabalin]; and Niaspan [niacin er]    Social History:  The patient  reports that he has never smoked. He has never used smokeless tobacco. He reports that he does not drink alcohol or use drugs.   Family History:  The patient's family history includes Cancer in his father; Dementia in his mother; Hyperlipidemia in his sister; Hypertension in his mother and sister; Osteoarthritis in his mother; Osteoporosis in his mother.    ROS: All other systems are reviewed and negative. Unless otherwise mentioned in H&P    PHYSICAL EXAM: VS:  There were no vitals taken for this visit. , BMI There is no height or weight on file to calculate BMI. GEN: Well nourished, well developed, in no acute distress HEENT: normal Neck: no JVD, carotid bruits, or masses Cardiac: ***RRR; no murmurs, rubs, or gallops,no edema  Respiratory:  Clear to auscultation bilaterally, normal work of breathing GI: soft, nontender, nondistended, + BS MS: no deformity or atrophy Skin: warm and dry, no rash Neuro:  Strength and sensation are intact Psych: euthymic mood, full affect   EKG:  EKG {ACTION; IS/IS [id number]} ordered today. The ekg ordered today demonstrates ***   Recent Labs: 12/04/2017: ALT 15; BUN 17; Creat 0.95; Hemoglobin 13.0; Magnesium 1.6; Platelets 285; Potassium 4.6; Sodium 138; TSH 1.78    Lipid Panel    Component Value Date/Time   CHOL 160 12/04/2017 0943    TRIG 225 (H) 12/04/2017 0943   HDL 30 (L) 12/04/2017 0943   CHOLHDL 5.3 (H) 12/04/2017 0943   VLDL 41 (H) 06/08/2016 0953   LDLCALC 96 12/04/2017 0943      Wt Readings from Last 3 Encounters:  01/30/18 (!) 305 lb (138.3 kg)  12/04/17 (!) 312 lb 9.6 oz (141.8 kg)  07/16/17 (!) 313 lb (142 kg)      Other studies Reviewed: Additional studies/ records that were reviewed today include: ***. Review of the above records demonstrates: ***   ASSESSMENT AND PLAN:  1.  ***   Current medicines are reviewed at length with the patient today.    Labs/ tests ordered today include: *** Phill Myron. [place] Pugh, ANP, AACC   02/26/2018 1:00 PM    [facility] [address] Office [phone] Fax [phone]

## 2018-02-26 NOTE — Telephone Encounter (Signed)
Apparently we received a preop EKG that was obtained on 02/20/2018, this EKG showed sinus tach with HR 110, RBBB. The RBBB is old. However I cannot expain why his resting HR is 110. He apparently already has a followup tomorrow with Jory Sims NP, will let her assess the resting tachycardia.

## 2018-02-26 NOTE — Telephone Encounter (Signed)
Left a message for the patient to call back and speak to the on call preop APP.

## 2018-02-27 ENCOUNTER — Ambulatory Visit: Payer: Medicare Other | Admitting: Cardiovascular Disease

## 2018-02-27 ENCOUNTER — Ambulatory Visit: Payer: Medicare Other | Admitting: Adult Health

## 2018-02-27 ENCOUNTER — Encounter: Payer: Self-pay | Admitting: Cardiovascular Disease

## 2018-02-27 VITALS — BP 138/78 | HR 95 | Ht 73.5 in | Wt 302.0 lb

## 2018-02-27 DIAGNOSIS — R000 Tachycardia, unspecified: Secondary | ICD-10-CM | POA: Diagnosis not present

## 2018-02-27 DIAGNOSIS — I10 Essential (primary) hypertension: Secondary | ICD-10-CM | POA: Diagnosis not present

## 2018-02-27 DIAGNOSIS — IMO0001 Reserved for inherently not codable concepts without codable children: Secondary | ICD-10-CM

## 2018-02-27 DIAGNOSIS — Z0389 Encounter for observation for other suspected diseases and conditions ruled out: Secondary | ICD-10-CM | POA: Diagnosis not present

## 2018-02-27 DIAGNOSIS — E782 Mixed hyperlipidemia: Secondary | ICD-10-CM

## 2018-02-27 DIAGNOSIS — I519 Heart disease, unspecified: Secondary | ICD-10-CM

## 2018-02-27 NOTE — Assessment & Plan Note (Addendum)
History of normal coronary arteries by cardiac catheterization performed by Dr. Martinique 08/25/2011.

## 2018-02-27 NOTE — Assessment & Plan Note (Signed)
History of hyperlipidemia on statin therapy with lipid profile performed 12/04/2017 revealing total cholesterol 160, LDL of 96 and HDL 30.

## 2018-02-27 NOTE — Assessment & Plan Note (Signed)
History of essential hypertension blood pressure measured today 138/78.  He is on enalapril.

## 2018-02-27 NOTE — Patient Instructions (Signed)
Medication Instructions:  Your physician recommends that you continue on your current medications as directed. Please refer to the Current Medication list given to you today.  If you need a refill on your cardiac medications before your next appointment, please call your pharmacy.   Lab work: NONE If you have labs (blood work) drawn today and your tests are completely normal, you will receive your results only by: [name] MyChart Message (if you have MyChart) OR . A paper copy in the mail If you have any lab test that is abnormal or we need to change your treatment, we will call you to review the results.  Testing/Procedures: Your physician has requested that you have an echocardiogram. Echocardiography is a painless test that uses sound waves to create images of your heart. It provides your doctor with information about the size and shape of your heart and how well your heart's chambers and valves are working. This procedure takes approximately one hour. There are no restrictions for this procedure.    Follow-Up: At [facility], you and your health needs are our priority.  As part of our continuing mission to provide you with exceptional heart care, we have created designated Provider Care Teams.  These Care Teams include your primary Cardiologist (physician) and Advanced Practice Providers (APPs -  Physician Assistants and Nurse Practitioners) who all work together to provide you with the care you need, when you need it. . You MAY SCHEDULE a follow up appointment AS NEEDED. You may see Dr. Gwenlyn Found or one of the following Advanced Practice Providers on your designated Care Team:   . Kerin Ransom, [place] . Almyra Deforest, PA-C . Fabian Sharp, PA-C . Jory Sims, DNP . Rosaria Ferries, PA-C . Roby Lofts, PA-C . Sande Rives, PA-C  Any Other Special Instructions Will Be Listed Below (If Applicable). YOU WILL BE CLEARED FOR YOUR UPCOMING PROCEDURE PENDING THE RESULTS OF YOUR  ECHOCARDIOGRAM.

## 2018-02-27 NOTE — Assessment & Plan Note (Signed)
Mr. Joslyn has a high resting sinus rhythm in the 100 range for no apparent reason.  His TSH is normal.  He does have sleep apnea on CPAP and is markedly obese.  He is scheduled to have a left carpal tunnel release as an outpatient and is here for preoperative clearance because of his fast heart rate.  I am going get a 2D echo to evaluate LV function.

## 2018-02-27 NOTE — Progress Notes (Signed)
02/27/2018 Jason Wyatt   [birth year]  [id number]  Primary Physician Jason Pinto, MD Primary Cardiologist: Jason Harp MD Jason Wyatt, [place]  HPI:  Jason Wyatt is a 63 y.o.  moderately overweight married Caucasian male father of 2 children who is accompanied by his son Jason Wyatt today .  I saw him in the office 07/08/2015.  He has a history of obesity, sleep apnea, treated hypertension and hyperlipidemia. He was referred for cardiovascular clearance for a elective left total knee replacement. He had a cardiac catheterization performed radially by Dr. Martinique 08/25/11 which was completely normal.  Left total knee replacement after I last saw him.  He is done well since that time.  He is scheduled for left carpal tunnel release and was noted to be mildly tachycardic on preoperative evaluation.  He was referred here for further evaluation.  He does complain of fatigue.  He does get dyspneic easily.  He denies chest pain.  Recent TSH was normal.  Current Meds  Medication Sig  . ACCU-CHEK SOFTCLIX LANCETS lancets USE WITH METER TO CHECK  BLOOD SUGAR ONCE A DAY  . acetaminophen (TYLENOL) 500 MG tablet Take 500 mg by mouth every 6 (six) hours as needed for moderate pain.  [name] acyclovir (ZOVIRAX) 400 MG tablet TAKE 1 TABLET BY MOUTH  DAILY AS NEEDED FOR COLD  SORES  . allopurinol (ZYLOPRIM) 300 MG tablet TAKE 1 TABLET BY MOUTH AT  BEDTIME  . aspirin 81 MG tablet Take 81 mg by mouth daily.  . carbidopa-levodopa (SINEMET IR) 25-250 MG tablet TAKE 1 TABLET BY MOUTH TWO  TIMES DAILY FOR RESTLESS  LEGS  . colchicine 0.6 MG tablet Take 0.6 mg by mouth as needed.  [name] dextromethorphan-guaiFENesin (MUCINEX DM) 30-600 MG 12hr tablet Take 1 tablet by mouth 2 (two) times daily as needed for cough.  Jason Wyatt Calcium (STOOL SOFTENER PO) Take 250 mg by mouth daily.   . DULoxetine (CYMBALTA) 60 MG capsule TAKE 1 CAPSULE BY MOUTH AT  BEDTIME  . enalapril (VASOTEC) 20 MG tablet TAKE 1 TABLET BY MOUTH  TWICE  A DAY  . fexofenadine (ALLEGRA) 180 MG tablet Take 180 mg by mouth daily as needed for allergies.   [name] gabapentin (NEURONTIN) 300 MG capsule TAKE TWO CAPSULES BY MOUTH  THREE TIMES DAILY. (Patient taking differently: TAKE 2 CAPS IN THE MORNING, 1 CAP IN THE EVENING, AND 2 CAPS AT NIGHT.)  . glucose blood test strip Check glucose daily  . JANUVIA 100 MG tablet TAKE 1 TABLET BY MOUTH  DAILY  . Magnesium 500 MG CAPS Take 1,500 mg by mouth every evening.   . meloxicam (MOBIC) 7.5 MG tablet Take 7.5 mg by mouth as needed for pain.  . metFORMIN (GLUCOPHAGE) 1000 MG tablet TAKE 1 TABLET BY MOUTH  TWICE A DAY WITH MEALS  . methocarbamol (ROBAXIN) 500 MG tablet Take 1 tablet (500 mg total) by mouth daily as needed for muscle spasms.  . Multiple Vitamin (MULTIVITAMIN) tablet Take 1 tablet by mouth daily.  . rosuvastatin (CRESTOR) 5 MG tablet Take 1 tablet (5 mg total) by mouth at bedtime.  . sennosides-docusate sodium (SENOKOT-S) 8.6-50 MG tablet Take 1 tablet by mouth daily.  . Vitamin D, Ergocalciferol, (DRISDOL) 50000 units CAPS capsule Take 1 capsule (50,000 Units total) by mouth as directed. Tuesday , Wednesday, Thursday, Sunday (Patient taking differently: Take 50,000 Units by mouth 2 (two) times a week. Wednesday and Sunday)     Allergies  Allergen Reactions  . Flaxseed [Linseed Oil] Rash  . Lyrica [Pregabalin] Itching and Rash  . Niaspan [Niacin Er] Rash    Social History   Socioeconomic History  . Marital status: Married    Spouse name: Not on file  . Number of children: Not on file  . Years of education: Not on file  . Highest education level: Not on file  Occupational History  . Not on file  Social Needs  . Financial resource strain: Not on file  . Food insecurity:    Worry: Not on file    Inability: Not on file  . Transportation needs:    Medical: Not on file    Non-medical: Not on file  Tobacco Use  . Smoking status: Never Smoker  . Smokeless tobacco: Never Used    Substance and Sexual Activity  . Alcohol use: No  . Drug use: No  . Sexual activity: Not on file  Lifestyle  . Physical activity:    Days per week: Not on file    Minutes per session: Not on file  . Stress: Not on file  Relationships  . Social connections:    Talks on phone: Not on file    Gets together: Not on file    Attends religious service: Not on file    Active member of club or organization: Not on file    Attends meetings of clubs or organizations: Not on file    Relationship status: Not on file  . Intimate partner violence:    Fear of current or ex partner: Not on file    Emotionally abused: Not on file    Physically abused: Not on file    Forced sexual activity: Not on file  Other Topics Concern  . Not on file  Social History Narrative  . Not on file     Review of Systems: General: negative for chills, fever, night sweats or weight changes.  Cardiovascular: negative for chest pain, dyspnea on exertion, edema, orthopnea, palpitations, paroxysmal nocturnal dyspnea or shortness of breath Dermatological: negative for rash Respiratory: negative for cough or wheezing Urologic: negative for hematuria Abdominal: negative for nausea, vomiting, diarrhea, bright red blood per rectum, melena, or hematemesis Neurologic: negative for visual changes, syncope, or dizziness All other systems reviewed and are otherwise negative except as noted above.    Blood pressure 138/78, pulse 95, height 6' 1.5" (1.867 m), weight (!) 302 lb (137 kg).  General appearance: alert and no distress Neck: no adenopathy, no carotid bruit, no JVD, supple, symmetrical, trachea midline and thyroid not enlarged, symmetric, no tenderness/mass/nodules Lungs: clear to auscultation bilaterally Heart: regular rate and rhythm, S1, S2 normal, no murmur, click, rub or gallop Extremities: extremities normal, atraumatic, no cyanosis or edema Pulses: 2+ and symmetric Skin: Skin color, texture, turgor normal.  No rashes or lesions Neurologic: Alert and oriented X 3, normal strength and tone. Normal symmetric reflexes. Normal coordination and gait  EKG sinus rhythm at 95 with right axis deviation and right bundle branch block, low limb voltage.  I personally reviewed this EKG.  ASSESSMENT AND PLAN:   Hyperlipidemia, mixed History of hyperlipidemia on statin therapy with lipid profile performed 12/04/2017 revealing total cholesterol 160, LDL of 96 and HDL 30.  Hypertension History of essential hypertension blood pressure measured today 138/78.  He is on enalapril.  Normal coronary arteries History of normal coronary arteries by cardiac catheterization performed by Dr. Martinique 08/25/2011.  Heart rate fast Mr. Dosher has a high resting sinus  rhythm in the 100 range for no apparent reason.  His TSH is normal.  He does have sleep apnea on CPAP and is markedly obese.  He is scheduled to have a left carpal tunnel release as an outpatient and is here for preoperative clearance because of his fast heart rate.  I am going get a 2D echo to evaluate LV function.      Jason Harp MD FACP,FACC,FAHA, [facility] 02/27/2018 3:01 PM

## 2018-03-06 ENCOUNTER — Ambulatory Visit (HOSPITAL_COMMUNITY): Payer: Medicare Other | Attending: Cardiology

## 2018-03-06 DIAGNOSIS — I519 Heart disease, unspecified: Secondary | ICD-10-CM | POA: Diagnosis not present

## 2018-03-06 MED ORDER — PERFLUTREN LIPID MICROSPHERE
1.0000 mL | INTRAVENOUS | Status: AC | PRN
  Administered 2018-03-06: 2 mL via INTRAVENOUS

## 2018-03-24 ENCOUNTER — Encounter: Payer: Self-pay | Admitting: Internal Medicine

## 2018-03-24 NOTE — Patient Instructions (Signed)

## 2018-03-24 NOTE — Progress Notes (Signed)
This very nice 63 y.o. MWM  presents for 3 month follow up with HTN, HLD, Pre-Diabetes and Vitamin D Deficiency. Patient has OSA on CPAP with improved Restorative sleep. Patient has Gout controlled on his meds.     Patient is treated for HTN circa 1973  & BP has been controlled at home. Today's BP is at goal -122/84.  In 2013, he had Nl coronary arteries by heart cath. Recent 2D echo per Dr Gwenlyn Found was reported Normal.  Patient has had no complaints of any cardiac type chest pain, palpitations, dyspnea / orthopnea / PND, dizziness, claudication, or dependent edema.     Hyperlipidemia is controlled with diet & meds. Patient denies myalgias or other med SE's. Last Lipids were at goal for Cholesterol with elevated Trig's:   Lab Results  Component Value Date   CHOL 160 12/04/2017   HDL 30 (L) 12/04/2017   LDLCALC 96 12/04/2017   TRIG 225 (H) 12/04/2017   CHOLHDL 5.3 (H) 12/04/2017      Also, the patient has Morbid Obesity (BMI 42+) and history of T2_NIDDM circa 2004  and has had no symptoms of reactive hypoglycemia, diabetic polys or visual blurring, but does endorse burning dysthesias of his feet.  Dietary compliance is historically poor consequent of his Gluttony and last A1c was not at goal: Lab Results  Component Value Date   HGBA1C 7.4 (H) 12/04/2017      Further, the patient also has history of Vitamin D Deficiency and supplements vitamin D without any suspected side-effects. Last vitamin D was at goal: Lab Results  Component Value Date   VD25OH 98 12/04/2017   Current Outpatient Medications on File Prior to Visit  Medication Sig  . ACCU-CHEK SOFTCLIX LANCETS lancets USE WITH METER TO CHECK  BLOOD SUGAR ONCE A DAY  . acetaminophen (TYLENOL) 500 MG tablet Take 500 mg by mouth every 6 (six) hours as needed for moderate pain.  [name] acyclovir (ZOVIRAX) 400 MG tablet TAKE 1 TABLET BY MOUTH  DAILY AS NEEDED FOR COLD  SORES  . allopurinol (ZYLOPRIM) 300 MG tablet TAKE 1 TABLET BY MOUTH AT   BEDTIME  . aspirin 81 MG tablet Take 81 mg by mouth daily.  . carbidopa-levodopa (SINEMET IR) 25-250 MG tablet TAKE 1 TABLET BY MOUTH TWO  TIMES DAILY FOR RESTLESS  LEGS  . dextromethorphan-guaiFENesin (MUCINEX DM) 30-600 MG 12hr tablet Take 1 tablet by mouth 2 (two) times daily as needed for cough.  Mariane Baumgarten Calcium (STOOL SOFTENER PO) Take 250 mg by mouth daily.   . DULoxetine (CYMBALTA) 60 MG capsule TAKE 1 CAPSULE BY MOUTH AT  BEDTIME  . enalapril (VASOTEC) 20 MG tablet TAKE 1 TABLET BY MOUTH  TWICE A DAY  . fexofenadine (ALLEGRA) 180 MG tablet Take 180 mg by mouth daily as needed for allergies.   [name] gabapentin (NEURONTIN) 300 MG capsule TAKE TWO CAPSULES BY MOUTH  THREE TIMES DAILY. (Patient taking differently: TAKE 2 CAPS IN THE MORNING, 1 CAP IN THE EVENING, AND 2 CAPS AT NIGHT.)  . glucose blood test strip Check glucose daily  . Magnesium 500 MG CAPS Take 1,500 mg by mouth every evening.   . meloxicam (MOBIC) 7.5 MG tablet Take 7.5 mg by mouth as needed for pain.  . methocarbamol (ROBAXIN) 500 MG tablet Take 1 tablet (500 mg total) by mouth daily as needed for muscle spasms.  . Multiple Vitamin (MULTIVITAMIN) tablet Take 1 tablet by mouth daily.  . rosuvastatin (CRESTOR) 5  MG tablet Take 1 tablet (5 mg total) by mouth at bedtime.  . sennosides-docusate sodium (SENOKOT-S) 8.6-50 MG tablet Take 1 tablet by mouth daily.  . Vitamin D, Ergocalciferol, (DRISDOL) 50000 units CAPS capsule Take 1 capsule (50,000 Units total) by mouth as directed. Tuesday , Wednesday, Thursday, Sunday (Patient taking differently: Take 50,000 Units by mouth 2 (two) times a week. Wednesday and Sunday)   No current facility-administered medications on file prior to visit.    Allergies  Allergen Reactions  . Flaxseed [Linseed Oil] Rash  . Lyrica [Pregabalin] Itching and Rash  . Niaspan [Niacin Er] Rash   PMHx:   Past Medical History:  Diagnosis Date  . Acute meniscal tear of left knee   . Allergic rhinitis     . Arthritis     back  . Borderline glaucoma   . Chronic back pain    stenosis  . CKD (chronic kidney disease), stage II   . Complication of anesthesia cervical fusion surgery 06/2010   "woke up next day w/ventilator on" told due to OSA  . GERD (gastroesophageal reflux disease)    takes Prevacid daily  . Gout    takes Allopurinol daily and Colchicine if needed  . History of adenomatous polyp of colon    tubular adenoma's  . History of Barrett's esophagus   . History of hiatal hernia   . Hyperlipidemia   . Hypertension   . Incomplete right bundle branch block (RBBB)   . OA (osteoarthritis)    knee  . OSA on CPAP   . Peripheral neuropathy   . Prostate cancer [facility]) urologist-  dr Alinda Money   dx 2013  s/p  radical non-nerve sparing prostatectomy 2014--  Stage pT2cNx,  PSA 3.48,  Gleason 3+3,  vol 55cc/  current PSA  0.01 (Aug 2016)  . Thyroid nodule    left side  . Type II diabetes mellitus ([place])   . Urinary frequency    takes Flomax daily  . Urine incontinence   . Wears glasses    Immunization History  Administered Date(s) Administered  . Influenza Inj Mdck Quad With Preservative 11/22/2016, 12/04/2017  . Influenza Split 10/17/2013, 10/29/2014  . Influenza, Seasonal, Injecte, Preservative Fre 11/29/2015  . Influenza-Unspecified 11/04/2012  . PPD Test 01/16/2006, 04/07/2013  . Pneumococcal Polysaccharide-23 04/07/2013, 11/22/2016  . Pneumococcal-Unspecified 09/17/2010  . Td 01/17/2011  . Zoster 01/16/2005   Past Surgical History:  Procedure Laterality Date  . ACHILLES TENDON SURGERY Left 06/27/2017   Procedure: ACHILLES TENDON REPAIR;  Surgeon: Evelina Bucy, DPM;  Location: [facility];  Service: Podiatry;  Laterality: Left;  . ANAL FISSURE REPAIR  06-05-2002  . BACK SURGERY    . CARDIAC CATHETERIZATION    . CARDIOVASCULAR STRESS TEST  01-01-2008   normal nuclear study/  no ischemia/  normal LV function and wall motion , 57%  . ESOPHAGOGASTRODUODENOSCOPY  09/26/2011    Procedure: ESOPHAGOGASTRODUODENOSCOPY (EGD);  Surgeon: Cleotis Nipper, MD;  Location: [facility];  Service: Endoscopy;  Laterality: N/A;  . GASTROC RECESSION EXTREMITY Left 06/27/2017   Procedure: GASTROC RECESSION EXTREMITY;  Surgeon: Evelina Bucy, DPM;  Location: [facility];  Service: Podiatry;  Laterality: Left;  . KNEE ARTHROSCOPY WITH MEDIAL MENISECTOMY Left 12/31/2014   Procedure: LEFT KNEE ARTHROSCOPY, PARTIAL MEDIAL AND LATERAL  MENISECTOMIES WITH [place];  Surgeon: Susa Day, MD;  Location: [facility];  Service: Orthopedics;  Laterality: Left;  . LEFT AND RIGHT HEART CATHETERIZATION WITH CORONARY ANGIOGRAM N/A 08/25/2011   Procedure:  LEFT AND RIGHT HEART CATHETERIZATION WITH CORONARY ANGIOGRAM;  Surgeon: Peter M Martinique, MD;  Location: [facility] CATH LAB;  Service: Cardiovascular;  Laterality: N/A;   Normal coronary arteries and LVF, ef 55-60%  . POSTERIOR FUSION CERVICAL SPINE  07-05-2010   C1 --  C4  . POSTERIOR LUMBAR FUSION  05-18-2014   L4 -- S1  . ROBOT ASSISTED LAPAROSCOPIC RADICAL PROSTATECTOMY  02/07/2012   Procedure: ROBOTIC ASSISTED LAPAROSCOPIC RADICAL PROSTATECTOMY LEVEL 1;  Surgeon: Molli Hazard, MD;  Location: [facility];  Service: Urology;  Laterality: N/A;     . TENDON TRANSFER Left 06/27/2017   Procedure: TENDON TRANSFER;  Surgeon: Evelina Bucy, DPM;  Location: [facility];  Service: Podiatry;  Laterality: Left;  . TOTAL KNEE ARTHROPLASTY Left 08/05/2015   Procedure: LEFT TOTAL KNEE ARTHROPLASTY;  Surgeon: Susa Day, MD;  Location: [facility];  Service: Orthopedics;  Laterality: Left;  . TRANSTHORACIC ECHOCARDIOGRAM  08-03-2011   mild LVH, ef 55-60%/  trivial TR and PR  . UVULOPALATOPHARYNGOPLASTY (UPPP)/TONSILLECTOMY/SEPTOPLASTY  1990's   and Adenoidectomy  . wisdom teeth extracted   1982   FHx:    Reviewed / unchanged  SHx:    Reviewed / unchanged   Systems Review:  Constitutional: Denies fever, chills, wt changes, headaches, insomnia,  fatigue, night sweats, change in appetite. Eyes: Denies redness, blurred vision, diplopia, discharge, itchy, watery eyes.  ENT: Denies discharge, congestion, post nasal drip, epistaxis, sore throat, earache, hearing loss, dental pain, tinnitus, vertigo, sinus pain, snoring.  CV: Denies chest pain, palpitations, irregular heartbeat, syncope, dyspnea, diaphoresis, orthopnea, PND, claudication or edema. Respiratory: denies cough, dyspnea, DOE, pleurisy, hoarseness, laryngitis, wheezing.  Gastrointestinal: Denies dysphagia, odynophagia, heartburn, reflux, water brash, abdominal pain or cramps, nausea, vomiting, bloating, diarrhea, constipation, hematemesis, melena, hematochezia  or hemorrhoids. Genitourinary: Denies dysuria, frequency, urgency, nocturia, hesitancy, discharge, hematuria or flank pain. Musculoskeletal: Denies arthralgias, myalgias, stiffness, jt. swelling, pain, limping or strain/sprain.  Skin: Denies pruritus, rash, hives, warts, acne, eczema or change in skin lesion(s). Neuro: No weakness, tremor, incoordination, spasms, paresthesia or pain. Psychiatric: Denies confusion, memory loss or sensory loss. Endo: Denies change in weight, skin or hair change.  Heme/Lymph: No excessive bleeding, bruising or enlarged lymph nodes.  Physical Exam  BP 122/84   Pulse 92   Temp 97.6 F (36.4 C)   Resp 18   Ht 6' 1.5" (1.867 m)   Wt (!) 313 lb 12.8 oz (142.3 kg)   BMI 40.84 kg/m   Appears  Over nourished and in no distress.  Eyes: PERRLA, EOMs, conjunctiva no swelling or erythema. Sinuses: No frontal/maxillary tenderness ENT/Mouth: EAC's clear, TM's nl w/o erythema, bulging. Nares clear w/o erythema, swelling, exudates. Oropharynx clear without erythema or exudates. Oral hygiene is good. Tongue normal, non obstructing. Hearing intact.  Neck: Supple. Thyroid not palpable. Car 2+/2+ without bruits, nodes or JVD. Chest: Respirations nl with BS clear & equal w/o rales, rhonchi, wheezing or  stridor.  Cor: Heart sounds normal w/ regular rate and rhythm without sig. murmurs, gallops, clicks or rubs. Peripheral pulses normal and equal  without edema.  Abdomen: Soft & bowel sounds normal. Non-tender w/o guarding, rebound, hernias, masses or organomegaly.  Lymphatics: Unremarkable.  Musculoskeletal: Full ROM all peripheral extremities, joint stability, 5/5 strength and normal gait.  Skin: Warm, dry without exposed rashes, lesions or ecchymosis apparent.  Neuro: Cranial nerves intact, reflexes equal bilaterally. Sensory-motor testing grossly intact. Tendon reflexes grossly intact.  Pysch: Alert & oriented x 3.  Insight and judgement nl &  appropriate. No ideations.  Assessment and Plan:  1. Essential hypertension  - Continue medication, monitor blood pressure at home.  - Continue DASH diet.  Reminder to go to the ER if any CP,  SOB, nausea, dizziness, severe HA, changes vision/speech.  - CBC with Differential/Platelet - COMPLETE METABOLIC PANEL WITH GFR - Magnesium - TSH  2. Type 2 diabetes mellitus with hyperlipidemia (HCC)  - Continue di - Continue diet, exercise  - Lifestyle modifications.  - Monitor appropriate labs.et/meds, exercise,& lifestyle modifications.  - Continue monitor periodic cholesterol/liver & renal functions   - Lipid panel - TSH - Hemoglobin A1c - Insulin, random  3. Type 2 diabetes mellitus with stage 2 chronic kidney disease, without long-term current use of insulin (HCC)  - Continue diet, exercise  - Lifestyle modifications.  - Monitor appropriate labs.  - Hemoglobin A1c - Insulin, random  4. Vitamin D deficiency  - Continue supplementation.   - VITAMIN D 25 Hydroxyl  5. DM type 2 with diabetic peripheral neuropathy (HCC)  - Continue diet, exercise  - Lifestyle modifications.  - Monitor appropriate labs.  - Hemoglobin A1c - Insulin, random  6. Idiopathic gout  - Uric acid  7. Medication management  - CBC with  Differential/Platelet - COMPLETE METABOLIC PANEL WITH GFR - Magnesium - Lipid panel - TSH - Hemoglobin A1c - Insulin, random - VITAMIN D 25 Hydroxyl - Uric acid      Discussed  regular exercise, BP monitoring, weight control to achieve/maintain BMI less than 25 and discussed med and SE's. Recommended labs to assess and monitor clinical status with further disposition pending results of labs. Over 30 minutes of exam, counseling, chart review was performed.

## 2018-03-25 ENCOUNTER — Ambulatory Visit (INDEPENDENT_AMBULATORY_CARE_PROVIDER_SITE_OTHER): Payer: Medicare Other | Admitting: Internal Medicine

## 2018-03-25 ENCOUNTER — Encounter: Payer: Self-pay | Admitting: Internal Medicine

## 2018-03-25 VITALS — BP 122/84 | HR 92 | Temp 97.6°F | Resp 18 | Ht 73.5 in | Wt 313.8 lb

## 2018-03-25 DIAGNOSIS — E785 Hyperlipidemia, unspecified: Secondary | ICD-10-CM

## 2018-03-25 DIAGNOSIS — E1122 Type 2 diabetes mellitus with diabetic chronic kidney disease: Secondary | ICD-10-CM | POA: Diagnosis not present

## 2018-03-25 DIAGNOSIS — I10 Essential (primary) hypertension: Secondary | ICD-10-CM | POA: Diagnosis not present

## 2018-03-25 DIAGNOSIS — E1169 Type 2 diabetes mellitus with other specified complication: Secondary | ICD-10-CM

## 2018-03-25 DIAGNOSIS — E559 Vitamin D deficiency, unspecified: Secondary | ICD-10-CM | POA: Diagnosis not present

## 2018-03-25 DIAGNOSIS — Z79899 Other long term (current) drug therapy: Secondary | ICD-10-CM

## 2018-03-25 DIAGNOSIS — N182 Chronic kidney disease, stage 2 (mild): Secondary | ICD-10-CM

## 2018-03-25 DIAGNOSIS — E1142 Type 2 diabetes mellitus with diabetic polyneuropathy: Secondary | ICD-10-CM

## 2018-03-25 DIAGNOSIS — M1000 Idiopathic gout, unspecified site: Secondary | ICD-10-CM

## 2018-03-25 MED ORDER — METFORMIN HCL ER 500 MG PO TB24: ORAL_TABLET | ORAL | 3 refills | Status: DC

## 2018-03-26 LAB — CBC WITH DIFFERENTIAL/PLATELET
Absolute Monocytes: 474 cells/uL (ref 200–950)
Basophils Absolute: 70 cells/uL (ref 0–200)
Basophils Relative: 1.1 %
Eosinophils Absolute: 141 cells/uL (ref 15–500)
Eosinophils Relative: 2.2 %
HCT: 41.9 % (ref 38.5–50.0)
Hemoglobin: 13.6 g/dL (ref 13.2–17.1)
Lymphs Abs: 1216 cells/uL (ref 850–3900)
MCH: 28.5 pg (ref 27.0–33.0)
MCHC: 32.5 g/dL (ref 32.0–36.0)
MCV: 87.7 fL (ref 80.0–100.0)
MPV: 10 fL (ref 7.5–12.5)
Monocytes Relative: 7.4 %
Neutro Abs: 4499 cells/uL (ref 1500–7800)
Neutrophils Relative %: 70.3 %
Platelets: 278 10*3/uL (ref 140–400)
RBC: 4.78 10*6/uL (ref 4.20–5.80)
RDW: 14.4 % (ref 11.0–15.0)
Total Lymphocyte: 19 %
WBC: 6.4 10*3/uL (ref 3.8–10.8)

## 2018-03-26 LAB — COMPLETE METABOLIC PANEL WITH GFR
AG Ratio: 1.7 (calc) (ref 1.0–2.5)
ALT: 11 U/L (ref 9–46)
AST: 13 U/L (ref 10–35)
Albumin: 4.4 g/dL (ref 3.6–5.1)
Alkaline phosphatase (APISO): 61 U/L (ref 35–144)
BUN: 13 mg/dL (ref 7–25)
CO2: 28 mmol/L (ref 20–32)
Calcium: 9.7 mg/dL (ref 8.6–10.3)
Chloride: 103 mmol/L (ref 98–110)
Creat: 0.99 mg/dL (ref 0.70–1.25)
GFR, Est African American: 92 mL/min/{1.73_m2} (ref >=60)
GFR, Est Non African American: 80 mL/min/{1.73_m2} (ref >=60)
Globulin: 2.6 g/dL (calc) (ref 1.9–3.7)
Glucose, Bld: 180 mg/dL — ABNORMAL HIGH (ref 65–99)
Potassium: 4.4 mmol/L (ref 3.5–5.3)
Sodium: 140 mmol/L (ref 135–146)
Total Bilirubin: 0.5 mg/dL (ref 0.2–1.2)
Total Protein: 7 g/dL (ref 6.1–8.1)

## 2018-03-26 LAB — URIC ACID: Uric Acid, Serum: 6.4 mg/dL (ref 4.0–8.0)

## 2018-03-26 LAB — TSH: TSH: 2.38 mIU/L (ref 0.40–4.50)

## 2018-03-26 LAB — LIPID PANEL
Cholesterol: 109 mg/dL (ref <200)
HDL: 35 mg/dL — ABNORMAL LOW (ref >=40)
LDL Cholesterol (Calc): 46 mg/dL (calc)
Non-HDL Cholesterol (Calc): 74 mg/dL (calc) (ref <130)
Total CHOL/HDL Ratio: 3.1 (calc) (ref <5.0)
Triglycerides: 214 mg/dL — ABNORMAL HIGH (ref <150)

## 2018-03-26 LAB — HEMOGLOBIN A1C
Hgb A1c MFr Bld: 7.6 % of total Hgb — ABNORMAL HIGH (ref <5.7)
Mean Plasma Glucose: 171 (calc)
eAG (mmol/L): 9.5 (calc)

## 2018-03-26 LAB — INSULIN, RANDOM: Insulin: 11.1 u[IU]/mL

## 2018-03-26 LAB — MAGNESIUM: Magnesium: 1.4 mg/dL — ABNORMAL LOW (ref 1.5–2.5)

## 2018-03-26 LAB — VITAMIN D 25 HYDROXY (VIT D DEFICIENCY, FRACTURES): Vit D, 25-Hydroxy: 83 ng/mL (ref 30–100)

## 2018-03-27 DIAGNOSIS — G5622 Lesion of ulnar nerve, left upper limb: Secondary | ICD-10-CM | POA: Diagnosis not present

## 2018-03-27 DIAGNOSIS — G8918 Other acute postprocedural pain: Secondary | ICD-10-CM | POA: Diagnosis not present

## 2018-03-27 DIAGNOSIS — G5620 Lesion of ulnar nerve, unspecified upper limb: Secondary | ICD-10-CM | POA: Insufficient documentation

## 2018-03-27 DIAGNOSIS — G5602 Carpal tunnel syndrome, left upper limb: Secondary | ICD-10-CM | POA: Diagnosis not present

## 2018-03-27 HISTORY — DX: Lesion of ulnar nerve, left upper limb: G56.22

## 2018-03-27 HISTORY — PX: CARPAL TUNNEL RELEASE: SHX101

## 2018-04-01 DIAGNOSIS — M545 Low back pain: Secondary | ICD-10-CM | POA: Diagnosis not present

## 2018-04-01 DIAGNOSIS — M7918 Myalgia, other site: Secondary | ICD-10-CM | POA: Diagnosis not present

## 2018-04-01 DIAGNOSIS — M47896 Other spondylosis, lumbar region: Secondary | ICD-10-CM | POA: Diagnosis not present

## 2018-04-01 DIAGNOSIS — M4326 Fusion of spine, lumbar region: Secondary | ICD-10-CM | POA: Diagnosis not present

## 2018-04-09 DIAGNOSIS — G5602 Carpal tunnel syndrome, left upper limb: Secondary | ICD-10-CM | POA: Diagnosis not present

## 2018-04-09 DIAGNOSIS — G5622 Lesion of ulnar nerve, left upper limb: Secondary | ICD-10-CM | POA: Diagnosis not present

## 2018-05-07 ENCOUNTER — Ambulatory Visit: Payer: Medicare Other | Admitting: Podiatry

## 2018-05-23 ENCOUNTER — Ambulatory Visit (INDEPENDENT_AMBULATORY_CARE_PROVIDER_SITE_OTHER): Payer: Medicare Other | Admitting: Podiatry

## 2018-05-23 ENCOUNTER — Encounter: Payer: Self-pay | Admitting: Podiatry

## 2018-05-23 ENCOUNTER — Other Ambulatory Visit: Payer: Self-pay

## 2018-05-23 VITALS — Temp 96.8°F

## 2018-05-23 DIAGNOSIS — M79676 Pain in unspecified toe(s): Secondary | ICD-10-CM

## 2018-05-23 DIAGNOSIS — E1142 Type 2 diabetes mellitus with diabetic polyneuropathy: Secondary | ICD-10-CM

## 2018-05-23 DIAGNOSIS — B351 Tinea unguium: Secondary | ICD-10-CM | POA: Diagnosis not present

## 2018-05-23 DIAGNOSIS — Q828 Other specified congenital malformations of skin: Secondary | ICD-10-CM

## 2018-05-23 NOTE — Progress Notes (Signed)
He presents today chief complaint of painful elongated toenails and calluses bilateral foot.  Denies any changes in his medication allergies surgeries or social history.  Denies any open lesions or wounds.  Objective: Toenails are long thick yellow dystrophic-like mycotic multiple reactive hyperkeratotic tissue along the plantar aspect of the bilateral forefoot and hallux.  Tenderness along painful palpation.  Assessment: Pain limb secondary to onychomycosis diabetic peripheral neuropathy and reactive hyperkeratotic tissue.  Plan: Debridement of all reactive hyperkeratotic tissue debridement of toenails 1 through 5 bilaterally.  Follow-up with him in 3 months

## 2018-06-11 ENCOUNTER — Other Ambulatory Visit: Payer: Self-pay | Admitting: Internal Medicine

## 2018-06-11 ENCOUNTER — Other Ambulatory Visit: Payer: Self-pay | Admitting: *Deleted

## 2018-06-11 ENCOUNTER — Other Ambulatory Visit: Payer: Self-pay | Admitting: Adult Health

## 2018-06-11 ENCOUNTER — Other Ambulatory Visit: Payer: Self-pay | Admitting: Physician Assistant

## 2018-06-11 MED ORDER — FAMOTIDINE 20 MG PO TABS: ORAL_TABLET | ORAL | 3 refills | Status: DC

## 2018-06-17 HISTORY — PX: CARPAL TUNNEL RELEASE: SHX101

## 2018-07-26 ENCOUNTER — Encounter: Payer: Self-pay | Admitting: Adult Health

## 2018-07-26 NOTE — Progress Notes (Signed)
Patient ID: Jason Wyatt, male   DOB: 1955/11/27, 63 y.o.   MRN: [id number]  3 month follow up and Medicare wellness  Assessment and Plan  Diagnoses and all orders for this visit:  Encounter for Medicare annual wellness exam  Essential hypertension Continue medication Monitor blood pressure at home; call if consistently over 130/80 Continue DASH diet.   Reminder to go to the ER if any CP, SOB, nausea, dizziness, severe HA, changes vision/speech, left arm numbness and tingling and jaw pain.  Atherosclerotic peripheral vascular disease (HCC) Control blood pressure, cholesterol, glucose, increase exercise.   Obesity hypoventilation syndrome Continue CPAP, weight loss encouraged  Type 2 diabetes mellitus with hyperlipidemia [facility]) Education: Reviewed 'ABCs' of diabetes management (respective goals in parentheses):  A1C (<7), blood pressure (<130/80), and cholesterol (LDL <70) Eye Exam yearly and Dental Exam every 6 months. Dietary recommendations Physical Activity recommendations - check lipid panel  Type 2 diabetes mellitus with other diabetic kidney complication, without long-term current use of insulin [facility]) Education: Reviewed 'ABCs' of diabetes management (respective goals in parentheses):  A1C (<7), blood pressure (<130/80), and cholesterol (LDL <70) Eye Exam yearly and Dental Exam every 6 months. Dietary recommendations Physical Activity recommendations  - CMP/GFR  DM type 2 with diabetic peripheral neuropathy (HCC) Check feet at home daily, call with changes/concerns Followed closely by podiatry  Primary osteoarthritis of left knee Followed by ortho  CKD (chronic kidney disease), stage II Increase fluids, avoid NSAIDS, monitor sugars, will monitor  RLS Continue meds, increase walking, monitor iron/B12  Morbid obesity (BMI 41+) Long discussion about weight loss, diet, and exercise Recommended diet heavy in fruits and veggies and low in animal meats, cheeses, and  dairy products, appropriate calorie intake Discussed appropriate weight for height  Follow up at next visit  Medication management CBC, CMP/GFR, magnesium  Hyperlipidemia, mixed Continue medications Continue low cholesterol diet and exercise.  Check lipid panel.   Idiopathic chronic gout without tophus, unspecified site Continue allopurinol Diet discussed Check uric acid as needed  Hx of prostate cancer S/p robotic excision, followed by Dr. [name], Flomax for residual LUTS  Need for pneumonia vaccine Missing 13 valent, administered today, then complete.   During the course of the visit the patient was educated and counseled about appropriate screening and preventive services including:    Pneumococcal vaccine   Influenza vaccine  Td vaccine  Screening electrocardiogram  Screening mammography  Bone densitometry screening  Colorectal cancer screening  Diabetes screening  Glaucoma screening  Nutrition counseling   Advanced directives: given information/requested  Future Appointments  Date Time Provider [place]  08/22/2018  8:15 AM [place], Bennie Pierini T, [place] [facility]  12/16/2018  9:00 AM Vicie Mutters, PA-C [facility] None    HPI 63 y.o. male  presents for 3 month follow up with hypertension, hyperlipidemia, diabetes and vitamin D deficiency and medicare wellness visit.   He is on CPAP for OSA/obesity hypoventilation syndrome and endorses 100% compliance and restorative sleep.   Has a ruptured achilles tendon in June 2019 s/p repair, followed with Dr. March Rummage. Follows with podiatry Dr. [name] for diabetic neuropathy. Following with Dr. Caralyn Guile for carpal tunnel. He follows with Dr. [name] for back pain.   He is on sinemet for RLS.   He has imbalance attributed to advanced peripheral neuropathy, no falls this year, does ambulate without cane reasonably at this time.   He is taking flomax in the AM per urology for sense of incomplete bladder  emptying; hx of prostate  cancer and robotic excision and followed by Dr. [name] at [facility].   he has a diagnosis of GERD which is currently managed by pepcid 20 mg daily  he reports symptoms is currently well controlled, and denies breakthrough reflux, burning in chest, hoarseness or cough.    BMI is Body mass index is 40.61 kg/m., he has not been working on diet and exercise due to recent surgery.  Wt Readings from Last 3 Encounters:  07/29/18 (!) 312 lb (141.5 kg)  03/25/18 (!) 313 lb 12.8 oz (142.3 kg)  02/27/18 (!) 302 lb (137 kg)   His blood pressure has been controlled at home, today their BP is BP: 132/88  He does workout, walks daily with dog. He denies chest pain, shortness of breath, dizziness.   He is on cholesterol medication, on rosuvastatin 5 mg daily denies myalgias. His cholesterol is not at goal. The cholesterol last visit was:   Lab Results  Component Value Date   CHOL 109 03/25/2018   HDL 35 (L) 03/25/2018   LDLCALC 46 03/25/2018   TRIG 214 (H) 03/25/2018   CHOLHDL 3.1 03/25/2018    He has been working on diet  for Type 2 diabetes on metformin, and denies foot ulcerations, increased appetite, nausea, paresthesia of the feet, polydipsia, polyuria, visual disturbances, vomiting and weight loss. He has been checking fasting glucose, reports 130-160 range typically, typically elevated if has a late night snack. Last A1C in the office was:  Lab Results  Component Value Date   HGBA1C 7.6 (H) 03/25/2018   Lab Results  Component Value Date   GFRNONAA 80 03/25/2018   Patient is on Vitamin D supplement.   Lab Results  Component Value Date   VD25OH 83 03/25/2018     Patient is on allopurinol for gout and does not report a recent flare.  Lab Results  Component Value Date   LABURIC 6.4 03/25/2018      Current Outpatient Medications  Medication Instructions  . ACCU-CHEK SOFTCLIX LANCETS lancets USE WITH METER TO CHECK  BLOOD SUGAR ONCE A DAY  .  acetaminophen (TYLENOL) 500 mg, Oral, Every 6 hours PRN  . acyclovir (ZOVIRAX) 400 MG tablet TAKE 1 TABLET BY MOUTH  DAILY AS NEEDED FOR COLD  SORES  . allopurinol (ZYLOPRIM) 300 MG tablet TAKE 1 TABLET BY MOUTH AT  BEDTIME  . aspirin 81 mg, Oral, Daily  . carbidopa-levodopa (SINEMET IR) 25-250 MG tablet TAKE 1 TABLET BY MOUTH TWO  TIMES DAILY FOR RESTLESS  LEGS  . dextromethorphan-guaiFENesin (MUCINEX DM) 30-600 MG 12hr tablet 1 tablet, Oral, 2 times daily PRN  . Docusate Calcium (STOOL SOFTENER PO) 250 mg, Oral, Daily  . DULoxetine (CYMBALTA) 60 MG capsule TAKE 1 CAPSULE BY MOUTH AT  BEDTIME  . enalapril (VASOTEC) 20 MG tablet TAKE 1 TABLET BY MOUTH  TWICE A DAY  . famotidine (PEPCID) 20 MG tablet Take 1 tablet 1 x /day for Acid Indigestion  . fexofenadine (ALLEGRA) 180 mg, Oral, Daily PRN  . gabapentin (NEURONTIN) 300 MG capsule TAKE TWO CAPSULES BY MOUTH  THREE TIMES DAILY.  [name] glucose blood test strip Check glucose daily   . Magnesium 1,500 mg, Oral, Every evening  . meloxicam (MOBIC) 7.5 mg, Oral, As needed  . metFORMIN (GLUCOPHAGE-XR) 500 MG 24 hr tablet Take 2 tablets 2 x /day after meals  for Diabetes  . methocarbamol (ROBAXIN) 500 mg, Oral, Daily PRN  . Multiple Vitamin (MULTIVITAMIN) tablet 1 tablet, Oral, Daily  . pantoprazole (PROTONIX)  40 MG tablet No dose, route, or frequency recorded.  . rosuvastatin (CRESTOR) 5 mg, Oral, Daily at bedtime  . sennosides-docusate sodium (SENOKOT-S) 8.6-50 MG tablet 1 tablet, Oral, Daily  . tamsulosin (FLOMAX) 0.4 MG CAPS capsule TAKE 1 CAPSULE BY MOUTH  DAILY AFTER SUPPER  . Vitamin D (Ergocalciferol) (DRISDOL) 50,000 Units, Oral, As directed, Tuesday , Wednesday, Thursday, Sunday     Medical History:  Past Medical History:  Diagnosis Date  . Acute meniscal tear of left knee   . Allergic rhinitis   . Arthritis     back  . Borderline glaucoma   . Chronic back pain    stenosis  . CKD (chronic kidney disease), stage II   . Complication  of anesthesia cervical fusion surgery 06/2010   "woke up next day w/ventilator on" told due to OSA  . GERD (gastroesophageal reflux disease)    takes Prevacid daily  . Gout    takes Allopurinol daily and Colchicine if needed  . History of adenomatous polyp of colon    tubular adenoma's  . History of Barrett's esophagus   . History of hiatal hernia   . Hyperlipidemia   . Hypertension   . Incomplete right bundle branch block (RBBB)   . OA (osteoarthritis)    knee  . OSA on CPAP   . Peripheral neuropathy   . Prostate cancer [facility]) urologist-  dr Alinda Money   dx 2013  s/p  radical non-nerve sparing prostatectomy 2014--  Stage pT2cNx,  PSA 3.48,  Gleason 3+3,  vol 55cc/  current PSA  0.01 (Aug 2016)  . Rupture of Achilles tendon   . Thyroid nodule    left side  . Type II diabetes mellitus ([place])   . Urinary frequency    takes Flomax daily  . Urine incontinence   . Wears glasses    Allergies Allergies  Allergen Reactions  . Flaxseed [Linseed Oil] Rash  . Lyrica [Pregabalin] Itching and Rash  . Niaspan [Niacin Er] Rash   Preventative Medicine Immunization History  Administered Date(s) Administered  . Influenza Inj Mdck Quad With Preservative 11/22/2016, 12/04/2017  . Influenza Split 10/17/2013, 10/29/2014  . Influenza, Seasonal, Injecte, Preservative Fre 11/29/2015  . Influenza-Unspecified 11/04/2012  . PPD Test 01/16/2006, 04/07/2013  . Pneumococcal Polysaccharide-23 04/07/2013, 11/22/2016  . Pneumococcal-Unspecified 09/17/2010  . Td 01/17/2011  . Zoster 01/16/2005   Colonoscopy 2016 - Dr. Cristina Gong - due 09/2019 EGD 2013 Echo 2013 CXR 2017  Tetanus: 2013 Influenza: 2019 Pneumonia: 2015, 2018 Prevnar 13: DUE Shingles: 2007  Vision: Dr. Einar Gip, 12/26/2017, report verified and abstracted Dental: Dr. Coralyn Pear, 2019, goes q10m  Patient Care Team: Unk Pinto, MD as PCP - General (Internal Medicine) Lorretta Harp, MD as PCP - Cardiology (Cardiology) Garrel Ridgel,  DPM as Consulting Physician (Podiatry) Webb Laws, [place] as Referring Physician (Optometry) [place], Peter M, MD as Consulting Physician (Cardiology) Ronald Lobo, MD as Consulting Physician (Gastroenterology) Susa Day, MD as Consulting Physician (Orthopedic Surgery) Ardis Hughs, MD as Attending Physician (Urology)   SURGICAL HISTORY He  has a past surgical history that includes Posterior fusion cervical spine (07-05-2010); Esophagogastroduodenoscopy (09/26/2011); Robot assisted laparoscopic radical prostatectomy (02/07/2012); wisdom teeth extracted  (1982); left and right heart catheterization with coronary angiogram (N/A, 08/25/2011); Anal fissure repair (06-05-2002); Cardiovascular stress test (01-01-2008); transthoracic echocardiogram (08-03-2011); Posterior lumbar fusion (05-18-2014); Uvulopalatopharyngoplasty (uppp)/tonsillectomy/septoplasty (1990's); Knee arthroscopy with medial menisectomy (Left, 12/31/2014); Cardiac catheterization; Back surgery; Total knee arthroplasty (Left, 08/05/2015); Gastroc recession extremity (Left, 06/27/2017); Achilles tendon surgery (  Left, 06/27/2017); and Tendon transfer (Left, 06/27/2017). FAMILY HISTORY His family history includes Cancer in his father; Dementia in his mother; Hyperlipidemia in his sister; Hypertension in his mother and sister; Osteoarthritis in his mother; Osteoporosis in his mother. SOCIAL HISTORY He  reports that he has never smoked. He has never used smokeless tobacco. He reports that he does not drink alcohol or use drugs.  MEDICARE WELLNESS OBJECTIVES: Physical activity: Current Exercise Habits: Home exercise routine, Type of exercise: walking, Time (Minutes): 30, Frequency (Times/Week): 7, Weekly Exercise (Minutes/Week): 210, Intensity: Mild, Exercise limited by: None identified Cardiac risk factors: Cardiac Risk Factors include: advanced age (>36men, >82 women);obesity (BMI >30kg/m2);hypertension;male  gender;dyslipidemia;diabetes mellitus Depression/mood screen:   Depression screen [facility] 2/9 07/29/2018  Decreased Interest 0  Down, Depressed, Hopeless 0  PHQ - 2 Score 0  Altered sleeping -  Tired, decreased energy -  Change in appetite -  Feeling bad or failure about yourself  -  Trouble concentrating -  Moving slowly or fidgety/restless -  Suicidal thoughts -  PHQ-9 Score -  Difficult doing work/chores -    ADLs:  In your present state of health, do you have any difficulty performing the following activities: 07/29/2018 03/25/2018  Hearing? N N  Vision? N N  Difficulty concentrating or making decisions? N N  Walking or climbing stairs? N N  Dressing or bathing? N N  Doing errands, shopping? N N  Some recent data might be hidden     Cognitive Testing  Alert? Yes  Normal Appearance?Yes  Oriented to person? Yes  Place? Yes   Time? Yes  Recall of three objects?  Yes  Can perform simple calculations? Yes  Displays appropriate judgment?Yes  Can read the correct time from a watch face?Yes  EOL planning: Does Patient Have a Medical Advance Directive?: No Would patient like information on creating a medical advance directive?: No - Patient declined   Review of Systems:  Review of Systems  Constitutional: Negative for chills, diaphoresis, fever, malaise/fatigue and weight loss.  HENT: Negative for congestion, ear pain, hearing loss, sore throat and tinnitus.   Eyes: Negative.  Negative for blurred vision and double vision.  Respiratory: Negative for cough, sputum production, shortness of breath and wheezing.   Cardiovascular: Negative for chest pain, palpitations, orthopnea, claudication, leg swelling and PND.  Gastrointestinal: Negative for abdominal pain, blood in stool, constipation, diarrhea, heartburn, melena, nausea and vomiting.  Genitourinary: Negative.   Musculoskeletal: Negative for falls, joint pain and myalgias.  Skin: Negative.  Negative for rash.  Neurological:  Negative for dizziness, tingling, sensory change, loss of consciousness, weakness and headaches.  Endo/Heme/Allergies: Negative for polydipsia.  Psychiatric/Behavioral: Negative.  Negative for depression, memory loss, substance abuse and suicidal ideas. The patient is not nervous/anxious and does not have insomnia.   All other systems reviewed and are negative.   Physical Exam: BP 132/88   Pulse 98   Temp (!) 97.3 F (36.3 C)   Wt (!) 312 lb (141.5 kg)   SpO2 97%   BMI 40.61 kg/m  Wt Readings from Last 3 Encounters:  07/29/18 (!) 312 lb (141.5 kg)  03/25/18 (!) 313 lb 12.8 oz (142.3 kg)  02/27/18 (!) 302 lb (137 kg)   General Appearance: Well nourished well developed, non-toxic appearing, in no apparent distress. Eyes: PERRLA, EOMs, conjunctiva no swelling or erythema ENT/Mouth: Ear canals clear with no erythema, swelling, or discharge.  TMs normal bilaterally, oropharynx clear, moist, with no exudate.   Neck: Supple, thyroid normal, no  JVD, no cervical adenopathy.  Respiratory: Respiratory effort normal, breath sounds clear A&P, no wheeze, rhonchi or rales noted.  No retractions, no accessory muscle usage Cardio: RRR with no MRGs. No edema noted Abdomen: Soft, + BS, obese,  Non tender, no guarding, rebound, hernias, masses. Musculoskeletal: Full ROM, 5/5 strength, antalgic gait Skin: Warm, dry without rashes, lesions, ecchymosis. Calluses to bilateral feet without ulcers or wounds. Neuro: Awake and oriented X 3, Cranial nerves intact. No cerebellar symptoms. He has 0/10 to monofilament in bilateral feet.  Psych: normal affect, Insight and Judgment appropriate.   Medicare Attestation I have personally reviewed: The patient's medical and social history Their use of alcohol, tobacco or illicit drugs Their current medications and supplements The patient's functional ability including ADLs,fall risks, home safety risks, cognitive, and hearing and visual impairment Diet and physical  activities Evidence for depression or mood disorders  The patient's weight, height, BMI, and visual acuity have been recorded in the chart.  I have made referrals, counseling, and provided education to the patient based on review of the above and I have provided the patient with a written personalized care plan for preventive services.     Izora Ribas, NP 11:56 AM [facility]

## 2018-07-29 ENCOUNTER — Ambulatory Visit (INDEPENDENT_AMBULATORY_CARE_PROVIDER_SITE_OTHER): Payer: Medicare Other | Admitting: Adult Health

## 2018-07-29 ENCOUNTER — Other Ambulatory Visit: Payer: Self-pay

## 2018-07-29 ENCOUNTER — Encounter: Payer: Self-pay | Admitting: Adult Health

## 2018-07-29 VITALS — BP 132/88 | HR 98 | Temp 97.3°F | Wt 312.0 lb

## 2018-07-29 DIAGNOSIS — E1122 Type 2 diabetes mellitus with diabetic chronic kidney disease: Secondary | ICD-10-CM

## 2018-07-29 DIAGNOSIS — E785 Hyperlipidemia, unspecified: Secondary | ICD-10-CM

## 2018-07-29 DIAGNOSIS — E662 Morbid (severe) obesity with alveolar hypoventilation: Secondary | ICD-10-CM

## 2018-07-29 DIAGNOSIS — E1142 Type 2 diabetes mellitus with diabetic polyneuropathy: Secondary | ICD-10-CM

## 2018-07-29 DIAGNOSIS — Z0001 Encounter for general adult medical examination with abnormal findings: Secondary | ICD-10-CM | POA: Diagnosis not present

## 2018-07-29 DIAGNOSIS — Z23 Encounter for immunization: Secondary | ICD-10-CM | POA: Diagnosis not present

## 2018-07-29 DIAGNOSIS — G2581 Restless legs syndrome: Secondary | ICD-10-CM

## 2018-07-29 DIAGNOSIS — M1712 Unilateral primary osteoarthritis, left knee: Secondary | ICD-10-CM

## 2018-07-29 DIAGNOSIS — G5603 Carpal tunnel syndrome, bilateral upper limbs: Secondary | ICD-10-CM

## 2018-07-29 DIAGNOSIS — Z0000 Encounter for general adult medical examination without abnormal findings: Secondary | ICD-10-CM

## 2018-07-29 DIAGNOSIS — E1169 Type 2 diabetes mellitus with other specified complication: Secondary | ICD-10-CM

## 2018-07-29 DIAGNOSIS — I10 Essential (primary) hypertension: Secondary | ICD-10-CM

## 2018-07-29 DIAGNOSIS — R2681 Unsteadiness on feet: Secondary | ICD-10-CM

## 2018-07-29 DIAGNOSIS — R6889 Other general symptoms and signs: Secondary | ICD-10-CM

## 2018-07-29 DIAGNOSIS — I70209 Unspecified atherosclerosis of native arteries of extremities, unspecified extremity: Secondary | ICD-10-CM

## 2018-07-29 DIAGNOSIS — E1129 Type 2 diabetes mellitus with other diabetic kidney complication: Secondary | ICD-10-CM

## 2018-07-29 DIAGNOSIS — M1A00X0 Idiopathic chronic gout, unspecified site, without tophus (tophi): Secondary | ICD-10-CM

## 2018-07-29 DIAGNOSIS — Z79899 Other long term (current) drug therapy: Secondary | ICD-10-CM

## 2018-07-29 DIAGNOSIS — N182 Chronic kidney disease, stage 2 (mild): Secondary | ICD-10-CM

## 2018-07-29 NOTE — Patient Instructions (Addendum)
Jason Wyatt , Thank you for taking time to come for your Medicare Wellness Visit. I appreciate your ongoing commitment to your health goals. Please review the following plan we discussed and let me know if I can assist you in the future.   These are the goals we discussed: Goals    . Fasting Blood Glucose<130    . Weight (lb) < 300 lb (136.1 kg)       This is a list of the screening recommended for you and due dates:  Health Maintenance  Topic Date Due  . Pneumonia vaccines (1 of 2 - PCV13) 03/18/2018  . Flu Shot  08/17/2018  . Hemoglobin A1C  09/25/2018  . Eye exam for diabetics  12/27/2018  . Complete foot exam   07/29/2019  . Colon Cancer Screening  09/17/2019  . Tetanus Vaccine  01/16/2021  .  Hepatitis C: One time screening is recommended by Center for Disease Control  (CDC) for  adults born from [birth year] through 1967.   Completed  . HIV Screening  Completed     Diabetes Mellitus and Foot Care Foot care is an important part of your health, especially when you have diabetes. Diabetes may cause you to have problems because of poor blood flow (circulation) to your feet and legs, which can cause your skin to:  Become thinner and drier.  Break more easily.  Heal more slowly.  Peel and crack. You may also have nerve damage (neuropathy) in your legs and feet, causing decreased feeling in them. This means that you may not notice minor injuries to your feet that could lead to more serious problems. Noticing and addressing any potential problems early is the best way to prevent future foot problems. How to care for your feet Foot hygiene  Wash your feet daily with warm water and mild soap. Do not use hot water. Then, pat your feet and the areas between your toes until they are completely dry. Do not soak your feet as this can dry your skin.  Trim your toenails straight across. Do not dig under them or around the cuticle. File the edges of your nails with an emery board or nail  file.  Apply a moisturizing lotion or petroleum jelly to the skin on your feet and to dry, brittle toenails. Use lotion that does not contain alcohol and is unscented. Do not apply lotion between your toes. Shoes and socks  Wear clean socks or stockings every day. Make sure they are not too tight. Do not wear knee-high stockings since they may decrease blood flow to your legs.  Wear shoes that fit properly and have enough cushioning. Always look in your shoes before you put them on to be sure there are no objects inside.  To break in new shoes, wear them for just a few hours a day. This prevents injuries on your feet. Wounds, scrapes, corns, and calluses  Check your feet daily for blisters, cuts, bruises, sores, and redness. If you cannot see the bottom of your feet, use a mirror or ask someone for help.  Do not cut corns or calluses or try to remove them with medicine.  If you find a minor scrape, cut, or break in the skin on your feet, keep it and the skin around it clean and dry. You may clean these areas with mild soap and water. Do not clean the area with peroxide, alcohol, or iodine.  If you have a wound, scrape, corn, or callus on your foot,  look at it several times a day to make sure it is healing and not infected. Check for: ? Redness, swelling, or pain. ? Fluid or blood. ? Warmth. ? Pus or a bad smell. General instructions  Do not cross your legs. This may decrease blood flow to your feet.  Do not use heating pads or hot water bottles on your feet. They may burn your skin. If you have lost feeling in your feet or legs, you may not know this is happening until it is too late.  Protect your feet from hot and cold by wearing shoes, such as at the beach or on hot pavement.  Schedule a complete foot exam at least once a year (annually) or more often if you have foot problems. If you have foot problems, report any cuts, sores, or bruises to your health care provider  immediately. Contact a health care provider if:  You have a medical condition that increases your risk of infection and you have any cuts, sores, or bruises on your feet.  You have an injury that is not healing.  You have redness on your legs or feet.  You feel burning or tingling in your legs or feet.  You have pain or cramps in your legs and feet.  Your legs or feet are numb.  Your feet always feel cold.  You have pain around a toenail. Get help right away if:  You have a wound, scrape, corn, or callus on your foot and: ? You have pain, swelling, or redness that gets worse. ? You have fluid or blood coming from the wound, scrape, corn, or callus. ? Your wound, scrape, corn, or callus feels warm to the touch. ? You have pus or a bad smell coming from the wound, scrape, corn, or callus. ? You have a fever. ? You have a red line going up your leg. Summary  Check your feet every day for cuts, sores, red spots, swelling, and blisters.  Moisturize feet and legs daily.  Wear shoes that fit properly and have enough cushioning.  If you have foot problems, report any cuts, sores, or bruises to your health care provider immediately.  Schedule a complete foot exam at least once a year (annually) or more often if you have foot problems. This information is not intended to replace advice given to you by your health care provider. Make sure you discuss any questions you have with your health care provider. Document Released: 12/31/1999 Document Revised: 02/14/2017 Document Reviewed: 02/04/2016 Elsevier Patient Education  2020 [employer].

## 2018-07-30 LAB — CBC WITH DIFFERENTIAL/PLATELET
Absolute Monocytes: 585 cells/uL (ref 200–950)
Basophils Absolute: 68 cells/uL (ref 0–200)
Basophils Relative: 1 %
Eosinophils Absolute: 170 cells/uL (ref 15–500)
Eosinophils Relative: 2.5 %
HCT: 39.4 % (ref 38.5–50.0)
Hemoglobin: 13 g/dL — ABNORMAL LOW (ref 13.2–17.1)
Lymphs Abs: 1775 cells/uL (ref 850–3900)
MCH: 29 pg (ref 27.0–33.0)
MCHC: 33 g/dL (ref 32.0–36.0)
MCV: 87.9 fL (ref 80.0–100.0)
MPV: 10.3 fL (ref 7.5–12.5)
Monocytes Relative: 8.6 %
Neutro Abs: 4202 cells/uL (ref 1500–7800)
Neutrophils Relative %: 61.8 %
Platelets: 270 10*3/uL (ref 140–400)
RBC: 4.48 10*6/uL (ref 4.20–5.80)
RDW: 14.9 % (ref 11.0–15.0)
Total Lymphocyte: 26.1 %
WBC: 6.8 10*3/uL (ref 3.8–10.8)

## 2018-07-30 LAB — MAGNESIUM: Magnesium: 1.5 mg/dL (ref 1.5–2.5)

## 2018-07-30 LAB — HEMOGLOBIN A1C
Hgb A1c MFr Bld: 7.6 %{Hb} — ABNORMAL HIGH (ref <5.7)
Mean Plasma Glucose: 171 (calc)
eAG (mmol/L): 9.5 (calc)

## 2018-07-30 LAB — COMPLETE METABOLIC PANEL WITHOUT GFR
AG Ratio: 1.7 (calc) (ref 1.0–2.5)
ALT: 11 U/L (ref 9–46)
AST: 13 U/L (ref 10–35)
Albumin: 4.1 g/dL (ref 3.6–5.1)
Alkaline phosphatase (APISO): 60 U/L (ref 35–144)
BUN: 14 mg/dL (ref 7–25)
CO2: 27 mmol/L (ref 20–32)
Calcium: 9.7 mg/dL (ref 8.6–10.3)
Chloride: 104 mmol/L (ref 98–110)
Creat: 0.84 mg/dL (ref 0.70–1.25)
GFR, Est African American: 106 mL/min/1.73m2 (ref >=60)
GFR, Est Non African American: 92 mL/min/1.73m2 (ref >=60)
Globulin: 2.4 g/dL (ref 1.9–3.7)
Glucose, Bld: 148 mg/dL — ABNORMAL HIGH (ref 65–99)
Potassium: 4.6 mmol/L (ref 3.5–5.3)
Sodium: 139 mmol/L (ref 135–146)
Total Bilirubin: 0.5 mg/dL (ref 0.2–1.2)
Total Protein: 6.5 g/dL (ref 6.1–8.1)

## 2018-07-30 LAB — LIPID PANEL
Cholesterol: 97 mg/dL (ref <200)
HDL: 31 mg/dL — ABNORMAL LOW (ref >=40)
LDL Cholesterol (Calc): 32 mg/dL
Non-HDL Cholesterol (Calc): 66 mg/dL (ref <130)
Total CHOL/HDL Ratio: 3.1 (calc) (ref <5.0)
Triglycerides: 290 mg/dL — ABNORMAL HIGH (ref <150)

## 2018-07-30 LAB — TSH: TSH: 2.07 mIU/L (ref 0.40–4.50)

## 2018-08-20 ENCOUNTER — Other Ambulatory Visit: Payer: Self-pay | Admitting: Adult Health

## 2018-08-22 ENCOUNTER — Ambulatory Visit (INDEPENDENT_AMBULATORY_CARE_PROVIDER_SITE_OTHER): Payer: Medicare Other | Admitting: Podiatry

## 2018-08-22 ENCOUNTER — Encounter: Payer: Self-pay | Admitting: Podiatry

## 2018-08-22 ENCOUNTER — Other Ambulatory Visit: Payer: Self-pay

## 2018-08-22 VITALS — Temp 96.7°F

## 2018-08-22 DIAGNOSIS — E1142 Type 2 diabetes mellitus with diabetic polyneuropathy: Secondary | ICD-10-CM | POA: Diagnosis not present

## 2018-08-22 DIAGNOSIS — Q828 Other specified congenital malformations of skin: Secondary | ICD-10-CM | POA: Diagnosis not present

## 2018-08-22 DIAGNOSIS — M79676 Pain in unspecified toe(s): Secondary | ICD-10-CM | POA: Diagnosis not present

## 2018-08-22 DIAGNOSIS — B351 Tinea unguium: Secondary | ICD-10-CM

## 2018-08-22 NOTE — Progress Notes (Signed)
He presents today chief complaint of painfully elongated toenails and calluses bilateral with a history of diabetic peripheral neuropathy.  He states that he is maintaining seems to be doing pretty well himself.  Objective: Vital signs are stable he is alert oriented x3 pulses are palpable.  Hammertoe deformities are present.  He has decreased sensorium per [employer] monofilament.  No open lesions or wounds.  Multiple reactive hyper keratomas distal clavi and plantar aspect of the forefoot bilaterally.  Toenails are long thick yellow dystrophic onychomycotic and painful palpation.  Assessment: Pain in limb secondary to onychomycosis.  Plan: I debrided all reactive hyperkeratotic lesions and the debrided toenails 1 through 5 bilateral.  I will follow-up with him in 3 months

## 2018-08-27 DIAGNOSIS — I10 Essential (primary) hypertension: Secondary | ICD-10-CM | POA: Diagnosis not present

## 2018-08-27 DIAGNOSIS — M47896 Other spondylosis, lumbar region: Secondary | ICD-10-CM | POA: Diagnosis not present

## 2018-08-27 DIAGNOSIS — M7918 Myalgia, other site: Secondary | ICD-10-CM | POA: Diagnosis not present

## 2018-08-27 DIAGNOSIS — M545 Low back pain: Secondary | ICD-10-CM | POA: Diagnosis not present

## 2018-08-27 DIAGNOSIS — M4326 Fusion of spine, lumbar region: Secondary | ICD-10-CM | POA: Diagnosis not present

## 2018-08-28 ENCOUNTER — Other Ambulatory Visit: Payer: Self-pay | Admitting: Physician Assistant

## 2018-08-30 DIAGNOSIS — M25561 Pain in right knee: Secondary | ICD-10-CM | POA: Diagnosis not present

## 2018-08-30 DIAGNOSIS — N182 Chronic kidney disease, stage 2 (mild): Secondary | ICD-10-CM | POA: Diagnosis not present

## 2018-08-30 DIAGNOSIS — M25562 Pain in left knee: Secondary | ICD-10-CM | POA: Diagnosis not present

## 2018-09-02 ENCOUNTER — Other Ambulatory Visit: Payer: Self-pay | Admitting: Internal Medicine

## 2018-09-02 ENCOUNTER — Other Ambulatory Visit: Payer: Self-pay | Admitting: Adult Health

## 2018-09-02 MED ORDER — GABAPENTIN 600 MG PO TABS: ORAL_TABLET | ORAL | 3 refills | Status: DC

## 2018-10-02 DIAGNOSIS — G5601 Carpal tunnel syndrome, right upper limb: Secondary | ICD-10-CM | POA: Diagnosis not present

## 2018-10-06 IMAGING — MR MR ANKLE*L* W/O CM
3 of 5 series · 8 of 40 positions shown · non-contrast
Comparison: Radiographs dated 05/28/2017 and 12/18/2013

CLINICAL DATA: Ankle pain.  Achilles tendon rupture.

EXAM:
MRI OF THE LEFT ANKLE WITHOUT CONTRAST
TECHNIQUE: Multiplanar, multisequence MR imaging of the ankle was performed. No
intravenous contrast was administered.

[Series 4: T2 fat-sat · axial · left · 4.0mm · 0.20mm/px · z∈[-11,+81]mm · 3 of 30 slices shown (1 of 2)]
[im 5/30]
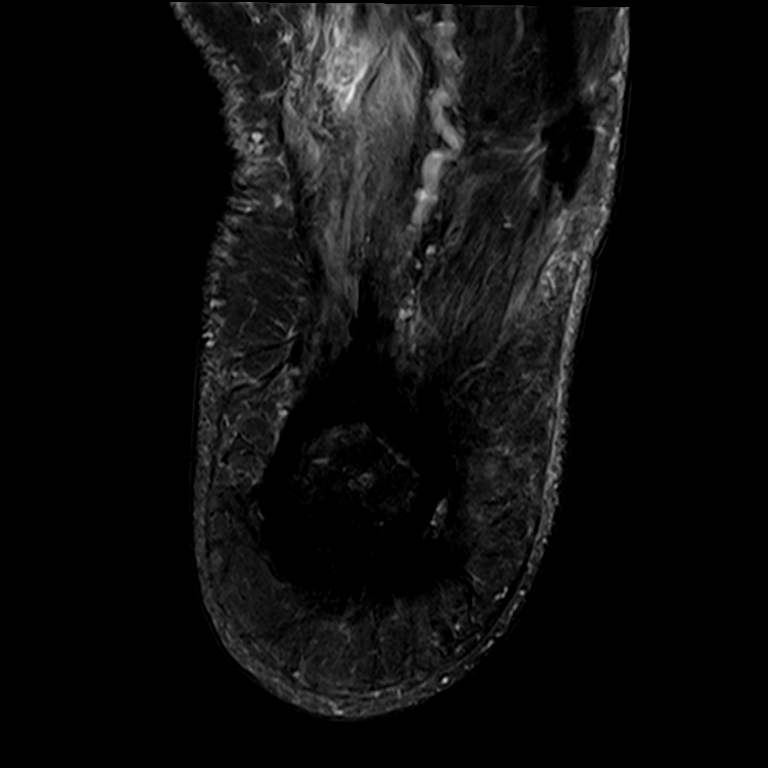
[im 17/30]
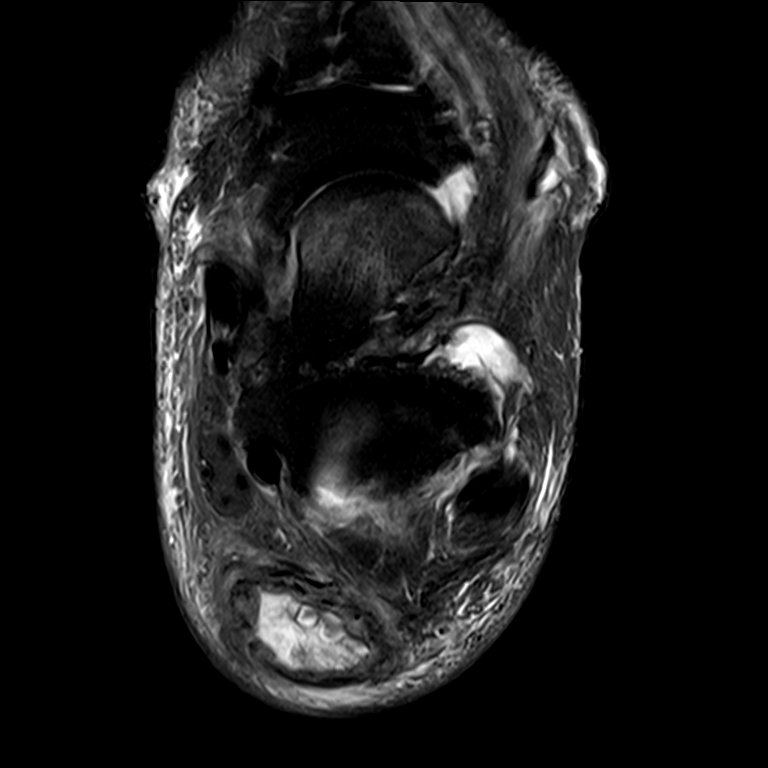
[im 25/30]
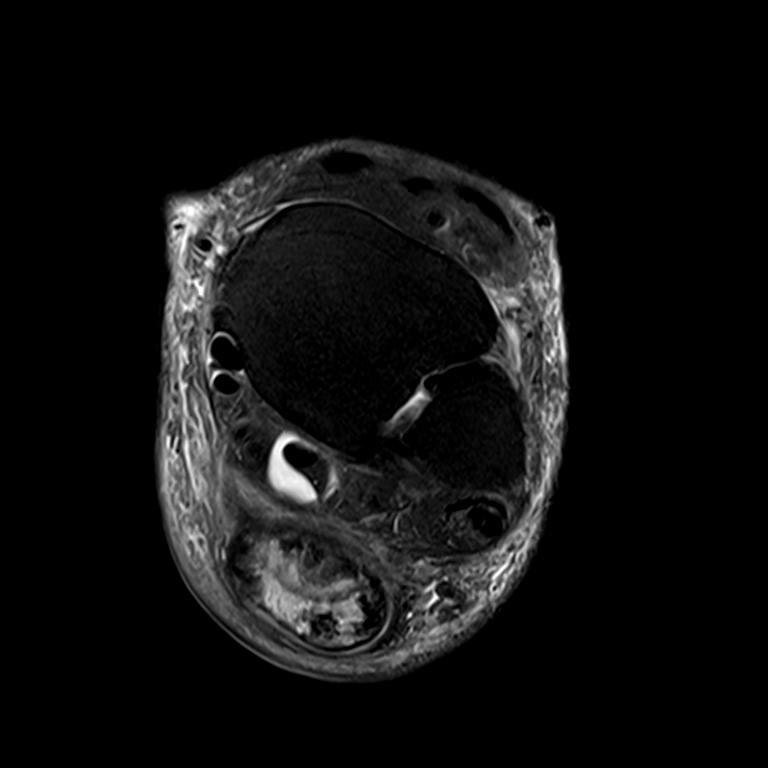

[Series 5: PD fat-sat · axial · left · 4.0mm · 0.20mm/px · z∈[-11,+81]mm · 3 of 30 slices shown]
[im 5/30]
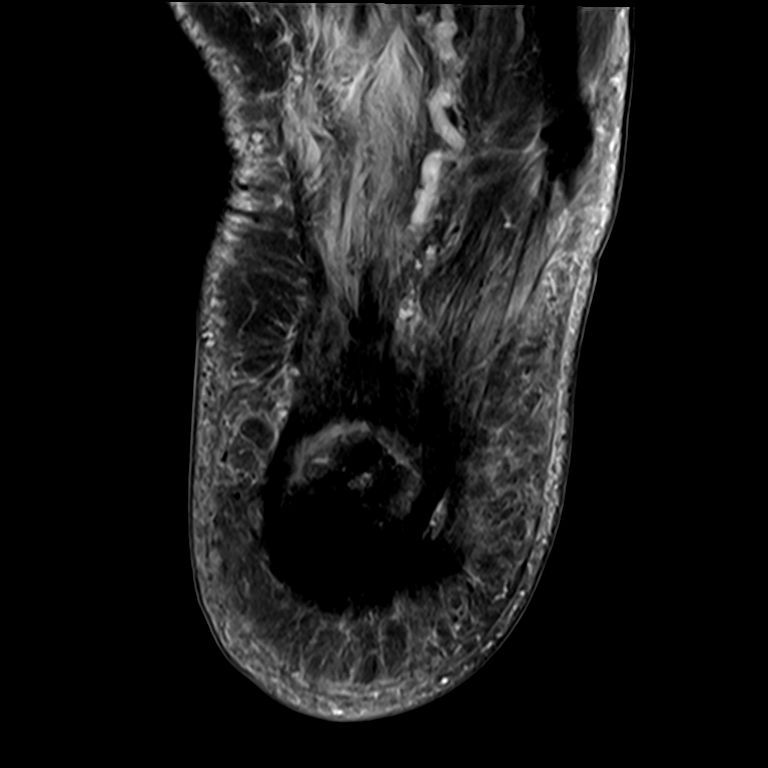
[im 17/30]
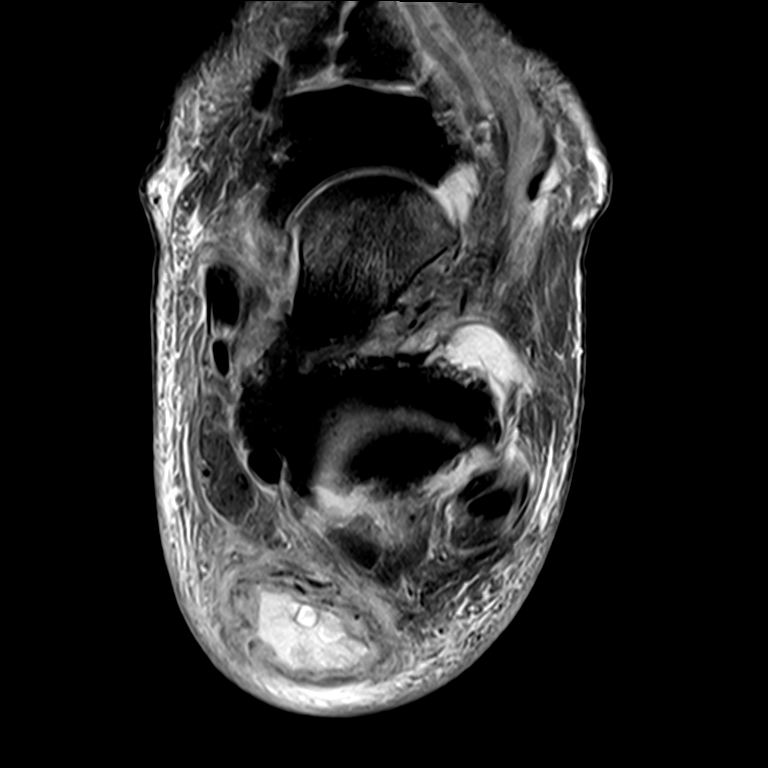
[im 25/30]
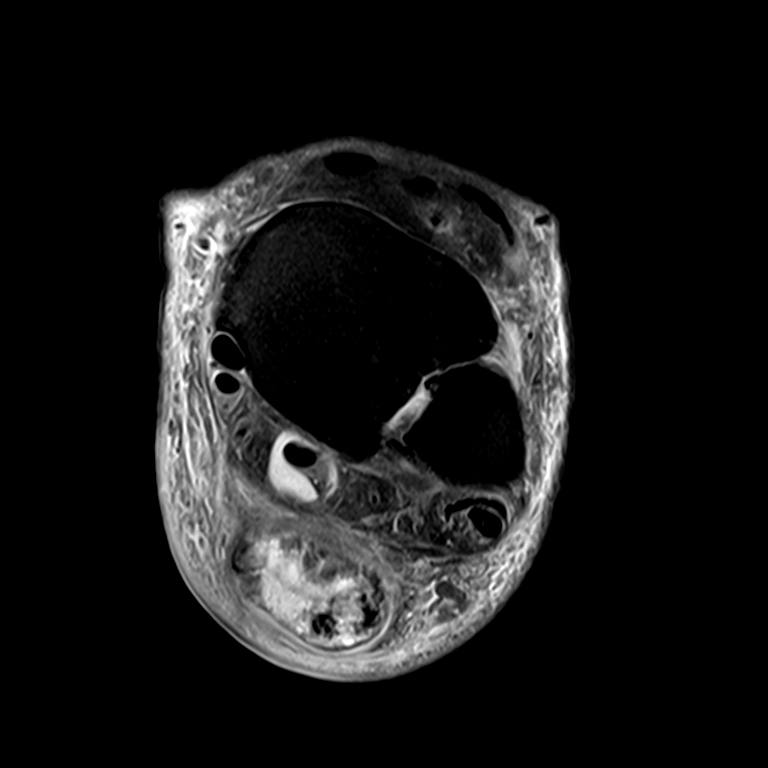

[Series 7: T2 fat-sat · sagittal · left · 2.5mm · 0.21mm/px · 2 of 37 slices shown (2 of 2)]
[im 5/37]
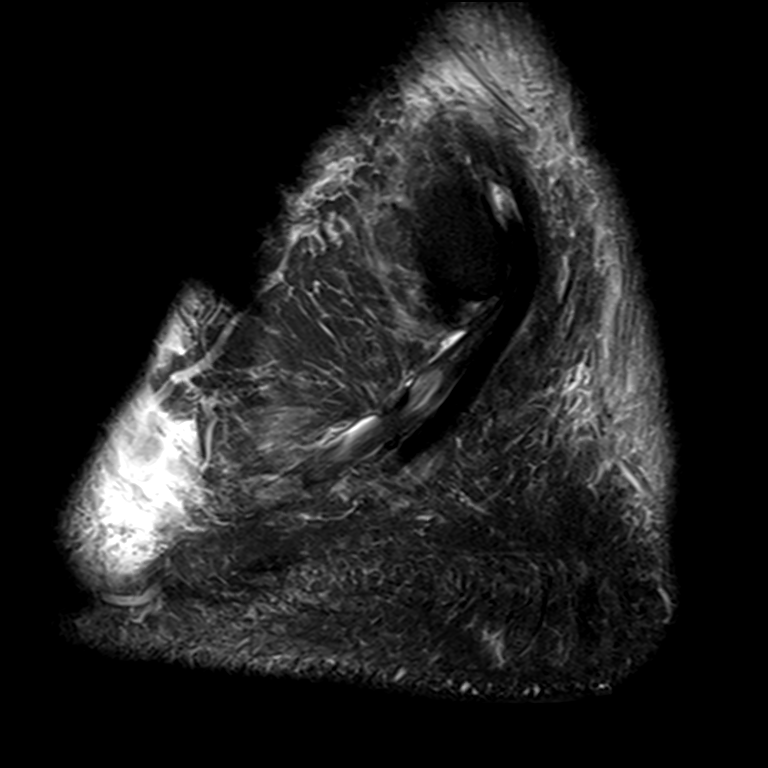
[im 19/37]
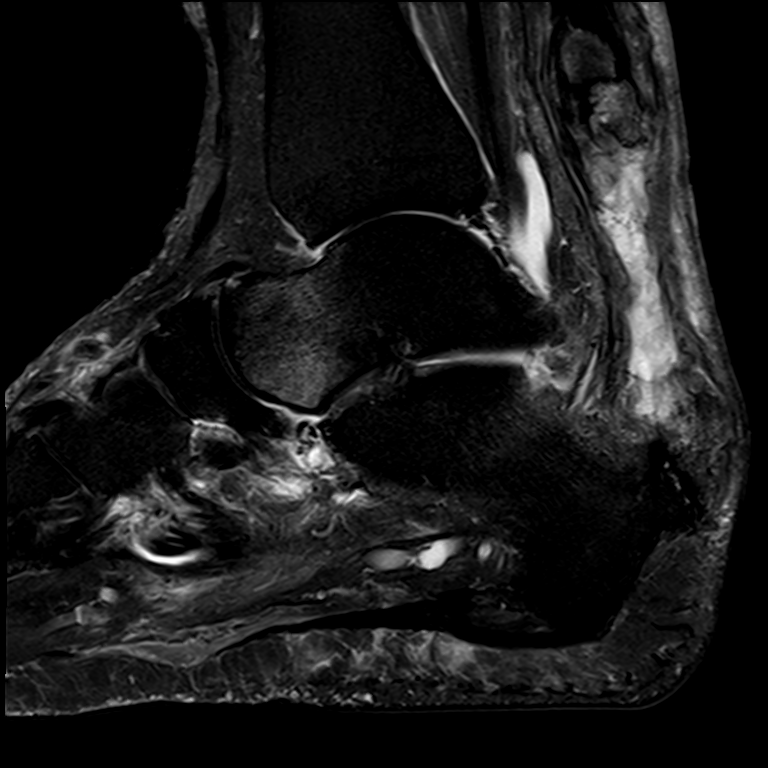

[8 of 40 positions shown; findings below may reference images not displayed]

FINDINGS: TENDONS

Peroneal: There is a longitudinal split tear of the peroneus brevis
tendon from the level of the tip of the lateral malleolus extending
distally. Peroneus longus tendon is intact. Small amount of fluid in
the distal peroneus longus tendon sheath.

Posteromedial: Intact. Small amount of fluid in the sheath of the
posterior tibialis tendon above the medial malleolus. Fluid in the
sheath of the flexor hallucis longus tendon likely related to the
small ankle joint effusion. The fluid in the tendon sheath does not
extend beyond the knot of Henry.

Anterior: Normal.

Achilles: Complete rupture and proximal retraction of the distal
Achilles tendon. The gap in the tendon is proximally 7 cm. The
retracted tendon is severely degenerated with hypertrophy and
multiple ossifications in the distal tendon. There is a chronic
Haglund deformity with prominent ossifications in the distal stump
of the Achilles tendon.

Plantar Fascia: Hypertrophy and chronic degenerative changes of
proximal portion of the plantar fascia without edema to suggest
acute inflammation.

LIGAMENTS

Lateral: Intact.

Medial: Intact.

CARTILAGE

Ankle Joint: Small ankle effusion. Focal subcortical edema deep to
the articular surface of the lateral malleolus. This is consistent
with overlying cartilage loss.

Subtalar Joints/Sinus Tarsi: Small subtalar joint effusion. No
discrete chondral defects.

Bones: Patchy edema in the distal talus. Slight dorsal spurring at
the talonavicular joint. Slight edema in the anterior margin of the
distal tibia.

Other: None
IMPRESSION: 1. Complete rupture of the Achilles tendon with a 7 cm gap in the
severely degenerated tendon.
2. Patchy edema in the distal talus and anterior aspect of the
distal tibia which may be a stress or impingement phenomenon.
3. Longitudinal split tear of the peroneus brevis tendon.

## 2018-10-14 IMAGING — DX DG ANKLE 2V *L*
2 series · 2 of 2 positions shown · non-contrast
Comparison: MRI 06/19/2017.

CLINICAL DATA: Left Achilles tendon repair.

EXAM:
LEFT ANKLE - 2 VIEW

[ankle ap]
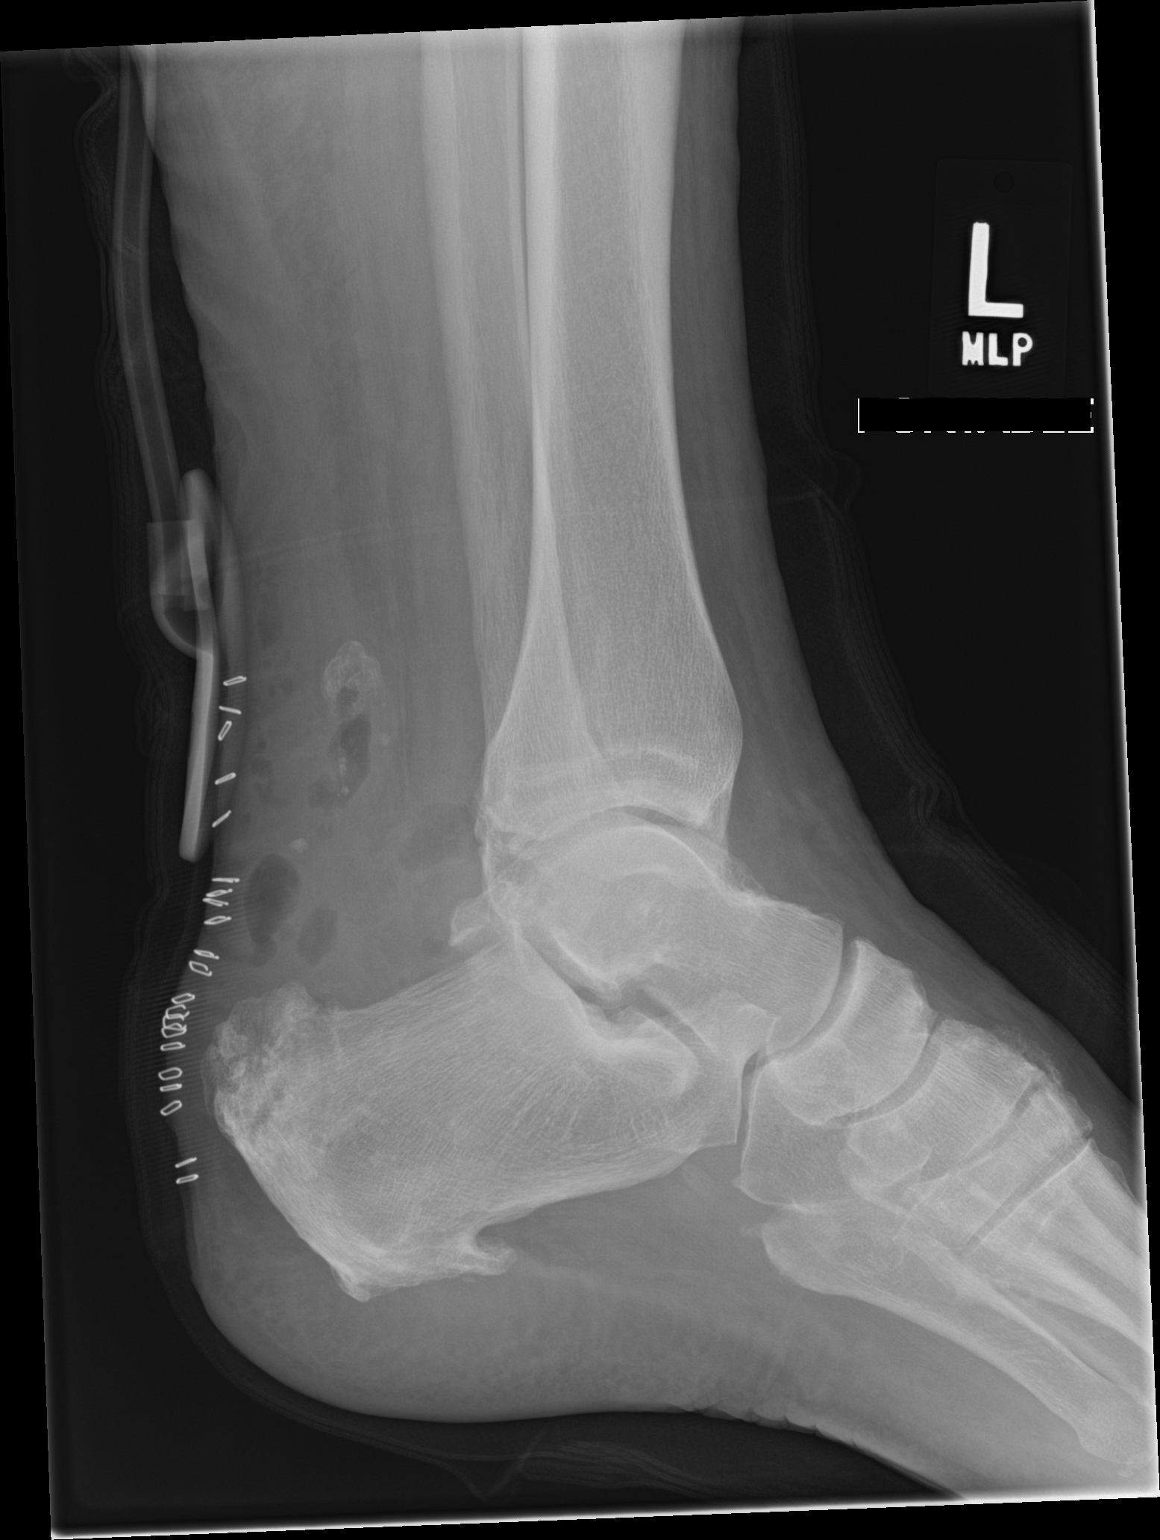

[ankle lat]
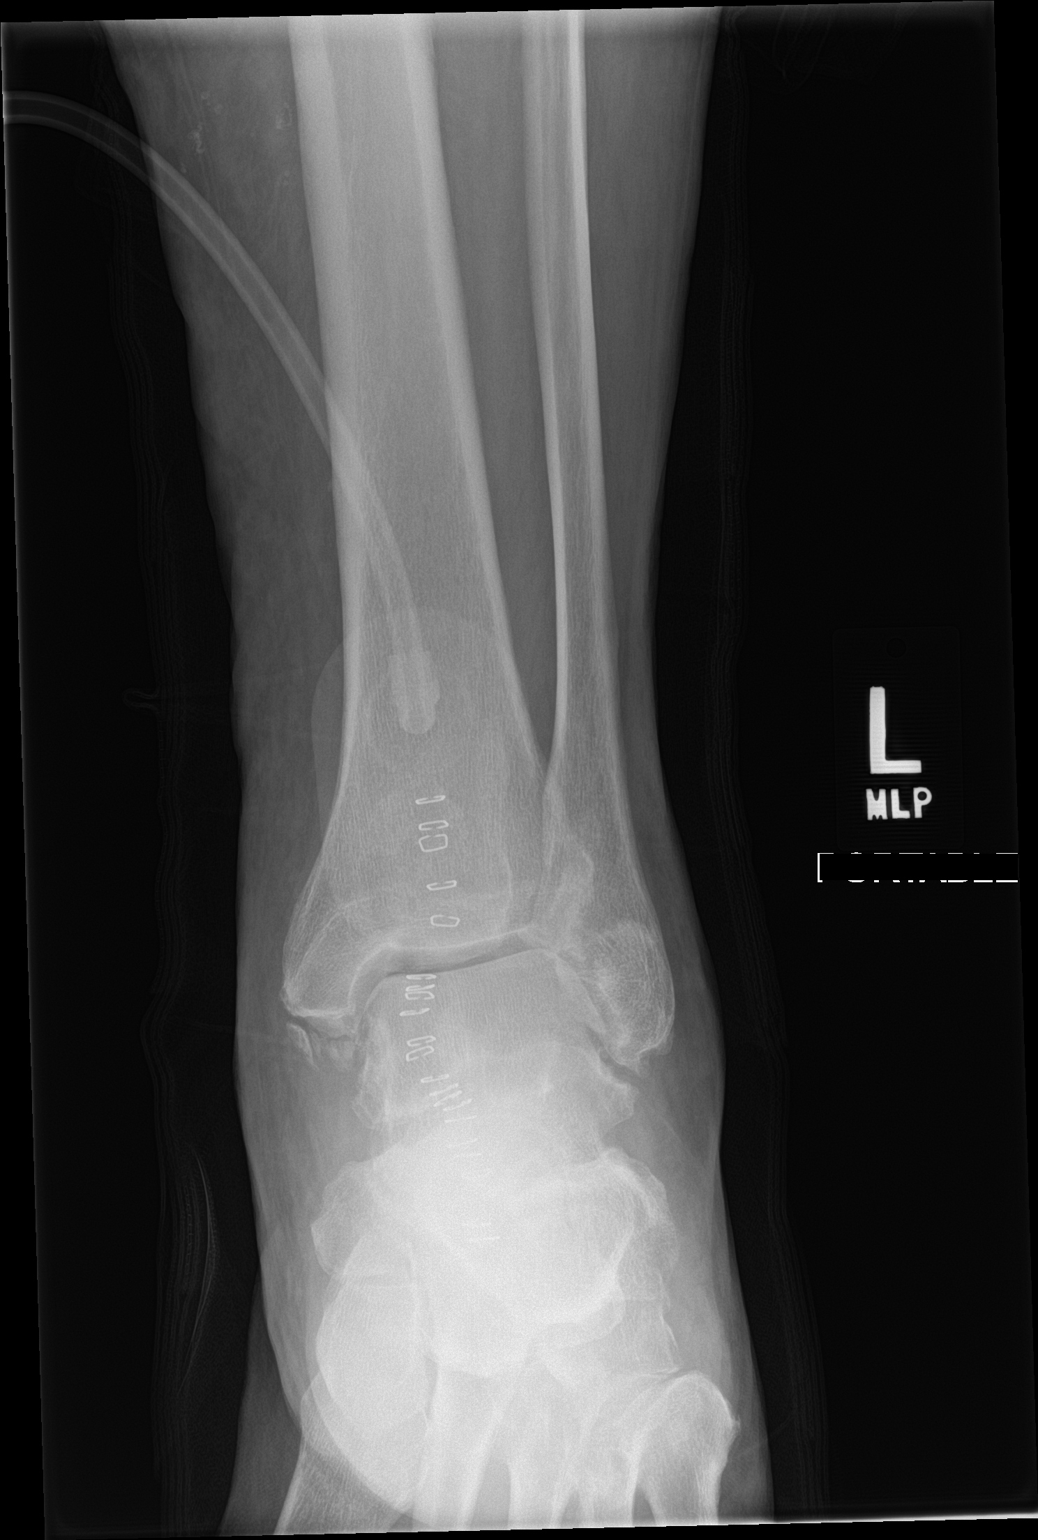

[2 of 2 positions shown; findings below may reference images not displayed]

FINDINGS: Postoperative changes from Achilles tendon repair identified. There
is gas identified within the surrounding soft tissues. Large
posterior and plantar heel spurs identified. Calcifications within
the Achilles tendon is again noted.
IMPRESSION: 1. Postoperative changes status post Achilles tendon repair.

## 2018-10-18 DIAGNOSIS — G5601 Carpal tunnel syndrome, right upper limb: Secondary | ICD-10-CM | POA: Diagnosis not present

## 2018-11-07 ENCOUNTER — Encounter: Payer: Medicare Other | Admitting: Adult Health

## 2018-11-21 ENCOUNTER — Encounter: Payer: Self-pay | Admitting: Podiatry

## 2018-11-21 ENCOUNTER — Other Ambulatory Visit: Payer: Self-pay

## 2018-11-21 ENCOUNTER — Ambulatory Visit: Payer: Medicare Other | Admitting: Podiatry

## 2018-11-21 DIAGNOSIS — M79676 Pain in unspecified toe(s): Secondary | ICD-10-CM

## 2018-11-21 DIAGNOSIS — E1142 Type 2 diabetes mellitus with diabetic polyneuropathy: Secondary | ICD-10-CM | POA: Diagnosis not present

## 2018-11-21 DIAGNOSIS — Q828 Other specified congenital malformations of skin: Secondary | ICD-10-CM

## 2018-11-21 DIAGNOSIS — B351 Tinea unguium: Secondary | ICD-10-CM | POA: Diagnosis not present

## 2018-11-21 NOTE — Progress Notes (Signed)
He presents today chief complaint of painfully elongated toenails and calluses bilateral he also has a history of diabetic peripheral neuropathy.  Type 2 diabetes states that his blood sugars under good control.  Objective: Vital signs are stable he is alert and oriented x3.  Pulses are palpable.  Toenails are long thick yellow dystrophic-like mycotic multiple areas of reactive hyperkeratoses no open lesions or wounds are noted.  No signs of cellulitis or sepsis.  Assessment: Pain in limb secondary to diabetic peripheral neuropathy pain in limb secondary to pain full mycotic nails as well as thick calluses.  Plan: Debridement of toenails and calluses bilateral.  Follow-up with 3 months consider getting him a new pair of diabetic shoes.

## 2018-11-25 NOTE — Progress Notes (Addendum)
Complete Physical   Assessment and Plan:   Jason Wyatt was seen today for annual exam.  Diagnoses and all orders for this visit:  Encounter for annual physical exam Yearly  Essential hypertension Continue current medications: Monitor blood pressure at home; call if consistently over 130/80 Continue DASH diet.   Reminder to go to the ER if any CP, SOB, nausea, dizziness, severe HA, changes vision/speech, left arm numbness and tingling and jaw pain. -     CBC with Diff -     COMPLETE METABOLIC PANEL WITH GFR -     TSH -     Magnesium -     EKG 12-Lead -     Microalbumin / Creatinine Urine Ratio  Atherosclerotic peripheral vascular disease (HCC) Control blood pressure, lipids and glucose Disscused lifestyle modifications, diet & exercise Continue to monitor  Type 2 diabetes mellitus with stage 2 chronic kidney disease, without long-term current use of insulin (HCC) Continue medications: Discussed general issues about diabetes pathophysiology and management. Education: Reviewed 'ABCs' of diabetes management (respective goals in parentheses):  A1C (<7), blood pressure (<130/80), and cholesterol (LDL <70) Dietary recommendations Encouraged aerobic exercise.  Discussed foot care, check daily Yearly retinal exam Dental exam every 6 months Monitor blood glucose, discussed goal for patient -     Hemoglobin A1c ([facility])  DM type 2 with diabetic peripheral neuropathy (HCC) Control glucose  Taking Neurontin 600mg  TID, Cymbalta 60mg  at bedtime Check feet daily Follows with podiatry regularly  CKD stage 2 due to type 2 diabetes mellitus ([place]) Continue glucose monitoring Continue medications Increase fluids Avoid NSAIDS Discussed dietary modifications and exercise Will continue to monitor  Hyperlipidemia associated with type 2 diabetes mellitus (HCC) Continue medications: Crestor 5mg  nightly Discussed dietary and exercise modifications Low fat diet -     Lipid  Profile  Bilateral carpal tunnel syndrome Doing well at this time  Primary osteoarthritis of left knee Doing well at this time  RLS Doing well Taking Senimet at night  Morbid obesity (BMI 41+) Discussed dietary and exercise modifications  Vitamin D deficiency Taking Vitamin D 50,000IU twice a week -     Vitamin D (25 hydroxy Deficency)  OSA on CPAP Continue CPAP, using nightly for at least 8 hours  Helping with daytime fatigue Weight loss still advised Discussed mask & tubing hygiene, cleaning every other day  Iron deficiency Taking multivitamin at this time  B12 deficiency Taking supplementation daily, sublingual -     Vitamin B12  Idiopathic chronic 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 Further disposition pending results of labs. Discussed med's effects and SE's.  Patient agrees with plan of care and opportunity to ask questions/voice concerns. Over 30 minutes of chart review, interview, exam, counseling, and critical decision making was performed.   Future Appointments  Date Time Provider [place]  11/29/2018  9:30 AM Velora Heckler [facility]  02/25/2019  8:45 AM Garrel Ridgel, DPM [facility]  08/07/2019 11:15 AM Liane Comber, NP [facility] None  11/26/2019  9:00 AM Liane Comber, NP [facility] None    ----------------------------------------------------------------------------------------------------------------------  HPI 63 y.o. male  presents  for 3 month follow up on HTN, HLD, OSA, DMII with CKD, RLS, weight and vitamin D  deficiency.   He has been taking Allegra and mucinex.  Reports that he his head feels full. He does endorse itchy ears, otalgia, watery eyes, sore throat that is intermittent.  He has not tried any nasal sprays or rinses.  Denies any fever, headache, change in vision, cough, shortness of breath, chests pains or N/V/D.    BMI is Body mass index is 41 kg/m., he has not been working on diet and exercise. Wt Readings from Last 3 Encounters:  11/26/18 (!) 315 lb (142.9 kg)  07/29/18 (!) 312 lb (141.5 kg)  03/25/18 (!) 313 lb 12.8 oz (142.3 kg)    HTN predates 1973 His blood pressure has been controlled at home 120's-130's over 70's-80's., today their BP is BP: (!) 148/90.  Reports he took his medication right before coming here today.  He does not workout. He denies any cardiac symptoms, chest pains, palpitations, shortness of breath, dizziness or lower extremity edema.     He is on cholesterol medication Rosuvastatin and denies myalgias. His cholesterol is not at goal. The cholesterol last visit was:   Lab Results  Component Value Date   CHOL 97 07/29/2018   HDL 31 (L) 07/29/2018   LDLCALC 32 07/29/2018   TRIG 290 (H) 07/29/2018   CHOLHDL 3.1 07/29/2018    He has not been working on diet and exercise for prediabetes, and denies increased appetite, nausea, polydipsia, polyuria, visual disturbances, vomiting and weight loss. He is taking metformin XR 500mg  two tablets twice a day.  He is taking gabapentin 600mg  twice a day and Cymbalta 60mg  for neuropathy bilateral lower extremities.  He checks his feet daily.  He recently had appointment with podiatrist, while he has routinely for maintenance.  Last A1C in the office was:  Lab Results  Component Value Date   HGBA1C 7.6 (H) 07/29/2018   Patient is on Vitamin D supplement.   Lab Results  Component Value Date   VD25OH 83 03/25/2018       Current Medications:  Current Outpatient Medications on File Prior to Visit  Medication Sig  .  acetaminophen (TYLENOL) 500 MG tablet Take 500 mg by mouth every 6 (six) hours as needed for moderate pain.  [name] acyclovir (ZOVIRAX) 400 MG tablet TAKE 1 TABLET BY MOUTH  DAILY AS NEEDED FOR COLD  SORES  . allopurinol (ZYLOPRIM) 300 MG tablet Take 1 tablet Daily to Prevent Gout  . aspirin 81 MG tablet Take 81 mg by mouth daily.  . carbidopa-levodopa (SINEMET IR) 25-250 MG tablet Take 1 tablet 2 x /day for Restless Legs  . dextromethorphan-guaiFENesin (MUCINEX DM) 30-600 MG 12hr tablet Take 1 tablet by mouth 2 (two) times daily as needed for cough.  Mariane Baumgarten Calcium (STOOL SOFTENER PO) Take 250 mg by mouth daily.   . DULoxetine (CYMBALTA) 60 MG capsule TAKE 1 CAPSULE BY MOUTH AT  BEDTIME  . enalapril (VASOTEC) 20 MG tablet Take 1 tablet 2 x /day for BP  . fexofenadine (ALLEGRA) 180 MG tablet Take 180 mg by mouth daily as needed for allergies.   [name] gabapentin (NEURONTIN) 600 MG tablet Take 1/2 to 1 tablet 2 to 3 x /Daily as needed for Pain  . glucose blood test strip Check glucose daily  . Magnesium 500 MG CAPS Take 1,500 mg by mouth every evening.   . meloxicam (MOBIC) 7.5 MG tablet Take 7.5 mg by mouth as  needed for pain.  . metFORMIN (GLUCOPHAGE-XR) 500 MG 24 hr tablet Take 2 tablets 2 x /day after meals  for Diabetes  . methocarbamol (ROBAXIN) 500 MG tablet Take 1 tablet (500 mg total) by mouth daily as needed for muscle spasms.  . Multiple Vitamin (MULTIVITAMIN) tablet Take 1 tablet by mouth daily.  . pantoprazole (PROTONIX) 40 MG tablet   . rosuvastatin (CRESTOR) 5 MG tablet Take 1 tablet Daily for Cholesterol  . sennosides-docusate sodium (SENOKOT-S) 8.6-50 MG tablet Take 1 tablet by mouth daily.  . tamsulosin (FLOMAX) 0.4 MG CAPS capsule TAKE 1 CAPSULE BY MOUTH  DAILY AFTER SUPPER  . Vitamin D, Ergocalciferol, (DRISDOL) 50000 units CAPS capsule Take 1 capsule (50,000 Units total) by mouth as directed. Tuesday , Wednesday, Thursday, Sunday (Patient taking differently: Take 50,000 Units by  mouth 2 (two) times a week. Wednesday and Sunday)  . ACCU-CHEK SOFTCLIX LANCETS lancets USE WITH METER TO CHECK  BLOOD SUGAR ONCE A DAY   No current facility-administered medications on file prior to visit.     Allergies:  Allergies  Allergen Reactions  . Flaxseed [Linseed Oil] Rash  . Lyrica [Pregabalin] Itching and Rash  . Niaspan [Niacin Er] Rash     Medical History:  Past Medical History:  Diagnosis Date  . Acute meniscal tear of left knee   . Allergic rhinitis   . Arthritis     back  . Borderline glaucoma   . Chronic back pain    stenosis  . CKD (chronic kidney disease), stage II   . Complication of anesthesia cervical fusion surgery 06/2010   "woke up next day w/ventilator on" told due to OSA  . GERD (gastroesophageal reflux disease)    takes Prevacid daily  . Gout    takes Allopurinol daily and Colchicine if needed  . History of adenomatous polyp of colon    tubular adenoma's  . History of Barrett's esophagus   . History of hiatal hernia   . Hyperlipidemia   . Hypertension   . Incomplete right bundle branch block (RBBB)   . OA (osteoarthritis)    knee  . OSA on CPAP   . Peripheral neuropathy   . Prostate cancer [facility]) urologist-  dr Alinda Money   dx 2013  s/p  radical non-nerve sparing prostatectomy 2014--  Stage pT2cNx,  PSA 3.48,  Gleason 3+3,  vol 55cc/  current PSA  0.01 (Aug 2016)  . Rupture of Achilles tendon   . Thyroid nodule    left side  . Type II diabetes mellitus ([place])   . Urinary frequency    takes Flomax daily  . Urine incontinence   . Wears glasses     Family history- Reviewed and unchanged   Social history- Reviewed and unchanged   Names of Other Physician/Practitioners you currently use: 1. [facility] here for primary care 2. Eye Exam: 12/2017, Due 12/2018. Dr Einar Gip 3. Dental Exam Q42[facility], Dr Coralyn Pear  Patient Care Team: Unk Pinto, MD as PCP - General (Internal Medicine) Lorretta Harp, MD as PCP - Cardiology (Cardiology) Garrel Ridgel, [place] as Consulting Physician (Podiatry) Webb Laws, [place] as Referring Physician (Optometry) [place], Peter M, MD as Consulting Physician (Cardiology) Ronald Lobo, MD as Consulting Physician (Gastroenterology) Susa Day, MD as Consulting Physician (Orthopedic Surgery) Ardis Hughs, MD as Attending Physician (Urology)   Screening Tests: Immunization History  Administered Date(s) Administered  . Influenza Inj Mdck Quad With Preservative 11/22/2016, 12/04/2017  . Influenza Split  10/17/2013, 10/29/2014  . Influenza, High Dose Seasonal PF 11/26/2018  . Influenza, Seasonal, Injecte, Preservative Fre 11/29/2015  . Influenza-Unspecified 11/04/2012  . PPD Test 01/16/2006, 04/07/2013  . Pneumococcal Conjugate-13 07/29/2018  . Pneumococcal Polysaccharide-23 04/07/2013, 11/22/2016  . Pneumococcal-Unspecified 09/17/2010  . Td 01/17/2011  . Zoster 01/16/2005     Vaccinations: TD or Tdap: 2013  Influenza: Due, Received today Pneumococcal: 2015 & 2018 Prevnar13:  DUE Shingles: Zostavax: 2007, Discussed Shingrix vaccination  Preventative Care: Last colonoscopy: 2016, Due 09/2019 Dr Buccini Hep C screening [phone]): 11/22/2016 PSA Screening  11/26/18, <0.1  Imaging: Chest X-ray: 2017 EKG: 02/28/18 ECHO: 03/06/18   Review of Systems:  Review of Systems  Constitutional: Negative for chills, diaphoresis, fever, malaise/fatigue and weight loss.  HENT: Positive for ear pain. Negative for congestion, ear discharge, hearing loss, nosebleeds, sinus pain, sore throat and tinnitus.        Ear fullness, bilateral  Eyes: Negative for blurred vision, double vision, photophobia, pain, discharge and redness.  Respiratory: Negative for cough, hemoptysis, sputum production, shortness of breath, wheezing and stridor.   Cardiovascular: Negative for chest pain, palpitations, orthopnea, claudication, leg swelling and PND.   Gastrointestinal: Negative for abdominal pain, blood in stool, constipation, diarrhea, heartburn, melena, nausea and vomiting.  Genitourinary: Negative for dysuria, flank pain, frequency, hematuria and urgency.  Musculoskeletal: Negative for back pain, falls, joint pain, myalgias and neck pain.  Skin: Negative for itching and rash.  Neurological: Negative for dizziness, tingling, tremors, sensory change, speech change, focal weakness, seizures, loss of consciousness, weakness and headaches.  Endo/Heme/Allergies: Negative for environmental allergies and polydipsia. Does not bruise/bleed easily.  Psychiatric/Behavioral: Negative for depression, hallucinations, memory loss, substance abuse and suicidal ideas. The patient is not nervous/anxious and does not have insomnia.       Physical Exam: BP (!) 148/90   Pulse 95   Temp (!) 91.2 F (32.9 C)   Ht 6' 1.5" (1.867 m)   Wt (!) 315 lb (142.9 kg)   SpO2 96%   BMI 41.00 kg/m  Wt Readings from Last 3 Encounters:  11/26/18 (!) 315 lb (142.9 kg)  07/29/18 (!) 312 lb (141.5 kg)  03/25/18 (!) 313 lb 12.8 oz (142.3 kg)   General Appearance: Well nourished, in no apparent distress. Eyes: PERRLA, EOMs, conjunctiva no swelling or erythema Sinuses: No Frontal/maxillary tenderness ENT/Mouth: Ext aud canals clear left, right cerumen impaction, TMs without erythema, bulging. No erythema, swelling, or exudate on post pharynx.  Tonsils not swollen or erythematous. Hearing normal.  Neck: Supple, thyroid normal.  Respiratory: Respiratory effort normal, BS equal bilaterally without rales, rhonchi, wheezing or stridor.  Cardio: RRR with no MRGs. Brisk peripheral pulses without edema.  Abdomen: Soft, + BS.  Non tender, no guarding, rebound, hernias, masses. Lymphatics: Non tender without lymphadenopathy.  Musculoskeletal: Full ROM, 5/5 strength, Normal gait Skin: Warm, dry without rashes, lesions, ecchymosis.  Neuro: Cranial nerves intact. No cerebellar  symptoms. Decreased sensation plantar surface, see foot exam. Psych: Awake and oriented X 3, normal affect, Insight and Judgment appropriate.    Garnet Sierras, NP [facility] 9:25 AM

## 2018-11-26 ENCOUNTER — Other Ambulatory Visit: Payer: Self-pay

## 2018-11-26 ENCOUNTER — Encounter: Payer: Self-pay | Admitting: Adult Health Nurse Practitioner

## 2018-11-26 ENCOUNTER — Ambulatory Visit (INDEPENDENT_AMBULATORY_CARE_PROVIDER_SITE_OTHER): Payer: Medicare Other | Admitting: Adult Health Nurse Practitioner

## 2018-11-26 VITALS — BP 148/90 | HR 95 | Temp 91.2°F | Ht 73.5 in | Wt 315.0 lb

## 2018-11-26 DIAGNOSIS — M1712 Unilateral primary osteoarthritis, left knee: Secondary | ICD-10-CM

## 2018-11-26 DIAGNOSIS — E785 Hyperlipidemia, unspecified: Secondary | ICD-10-CM

## 2018-11-26 DIAGNOSIS — J0140 Acute pansinusitis, unspecified: Secondary | ICD-10-CM

## 2018-11-26 DIAGNOSIS — I10 Essential (primary) hypertension: Secondary | ICD-10-CM | POA: Diagnosis not present

## 2018-11-26 DIAGNOSIS — E559 Vitamin D deficiency, unspecified: Secondary | ICD-10-CM

## 2018-11-26 DIAGNOSIS — E1122 Type 2 diabetes mellitus with diabetic chronic kidney disease: Secondary | ICD-10-CM | POA: Diagnosis not present

## 2018-11-26 DIAGNOSIS — G2581 Restless legs syndrome: Secondary | ICD-10-CM

## 2018-11-26 DIAGNOSIS — Z0000 Encounter for general adult medical examination without abnormal findings: Secondary | ICD-10-CM | POA: Diagnosis not present

## 2018-11-26 DIAGNOSIS — E538 Deficiency of other specified B group vitamins: Secondary | ICD-10-CM

## 2018-11-26 DIAGNOSIS — H6120 Impacted cerumen, unspecified ear: Secondary | ICD-10-CM

## 2018-11-26 DIAGNOSIS — N182 Chronic kidney disease, stage 2 (mild): Secondary | ICD-10-CM

## 2018-11-26 DIAGNOSIS — Z79899 Other long term (current) drug therapy: Secondary | ICD-10-CM

## 2018-11-26 DIAGNOSIS — E1169 Type 2 diabetes mellitus with other specified complication: Secondary | ICD-10-CM

## 2018-11-26 DIAGNOSIS — Z23 Encounter for immunization: Secondary | ICD-10-CM | POA: Diagnosis not present

## 2018-11-26 DIAGNOSIS — G4733 Obstructive sleep apnea (adult) (pediatric): Secondary | ICD-10-CM

## 2018-11-26 DIAGNOSIS — Z125 Encounter for screening for malignant neoplasm of prostate: Secondary | ICD-10-CM

## 2018-11-26 DIAGNOSIS — Z136 Encounter for screening for cardiovascular disorders: Secondary | ICD-10-CM

## 2018-11-26 DIAGNOSIS — E611 Iron deficiency: Secondary | ICD-10-CM

## 2018-11-26 DIAGNOSIS — G5603 Carpal tunnel syndrome, bilateral upper limbs: Secondary | ICD-10-CM

## 2018-11-26 DIAGNOSIS — I70209 Unspecified atherosclerosis of native arteries of extremities, unspecified extremity: Secondary | ICD-10-CM

## 2018-11-26 DIAGNOSIS — M1A00X0 Idiopathic chronic gout, unspecified site, without tophus (tophi): Secondary | ICD-10-CM

## 2018-11-26 DIAGNOSIS — E1142 Type 2 diabetes mellitus with diabetic polyneuropathy: Secondary | ICD-10-CM

## 2018-11-26 NOTE — Patient Instructions (Addendum)
You received your influenza vaccination today.  Use the Neti pot twice a day  Change your antihistamine from Allegra to Zyrtec.  Take one tablet every night   If these changes do not help then pick up the antibiotic that we sent too the pharrmacy.    Zyrtec / Cetirizine Take 10mg  by mouth May cause drowsiness, take nightly Be sure to drink plenty of water If this is not effective, try Xyzal OR Allegra  OR  Xyzal / Levocetirazine  Take 5mg  by mouth May cause drowsiness, take nightly Be sure to drink plenty of water If this is not effective try Allegra OR Zyrtec  OR  Allegra / fexofenadine Take 180mg  by mouth If this is not effective try Zyrtec OR Xyzal   *If you battle with chronic allergies you may need to change the antihistamine you currently use to find most effective.   -[facility] Sinus Rinse / Neti Pot Use warm bottled or distilled water DO NOT use tap water! Use twice a day as needed This will help to sooth irritated sinuses and clear nasal congestion If using nasal sprays, do so after completing this.  Flonase:   Use this if your nose feels congested One spray in each nostril daily  This will help to open your nasal passages.  Saline Nasal Spray: to sooth your nose You can get this at any pharmacy Use as directed on package This will help to sooth inside of your nose from irritation.   Below is information about the Shingrix vaccination for Shingles.  You may get this at your local pharmacy, call for insurance coverage details.   Ask insurance and pharmacy about shingrix - new vaccine   Can go to [email or web address] for more information  Shingrix Vaccination  Two vaccines are licensed and recommended to prevent shingles in the [place] Zoster vaccine live (ZVL, Zostavax) has been in use since 2006. Recombinant zoster vaccine (RZV, Shingrix), has been in use since 2017 and is recommended by ACIP as  the preferred shingles vaccine.  What Everyone Should Know about Shingles Vaccine (Shingrix) One of the Recommended Vaccines by Disease Shingles vaccination is the only way to protect against shingles and postherpetic neuralgia (PHN), the most common complication from shingles. CDC recommends that healthy adults 50 years and older get two doses of the shingles vaccine called Shingrix (recombinant zoster vaccine), separated by 2 to 6 months, to prevent shingles and the complications from the disease. Your doctor or pharmacist can give you Shingrix as a shot in your upper arm. Shingrix provides strong protection against shingles and PHN. Two doses of Shingrix is more than 90% effective at preventing shingles and PHN. Protection stays above 85% for at least the first four years after you get vaccinated. Shingrix is the preferred vaccine, over Zostavax (zoster vaccine live), a shingles vaccine in use since 2006. Zostavax may still be used to prevent shingles in healthy adults 60 years and older. For example, you could use Zostavax if a person is allergic to Shingrix, prefers Zostavax, or requests immediate vaccination and Shingrix is unavailable. Who Should Get Shingrix? Healthy adults 50 years and older should get two doses of Shingrix, separated by 2 to 6 months. You should get Shingrix even if in the past you . had shingles  . received Zostavax  . are not sure if you had chickenpox There is no maximum age for getting Shingrix. If you had shingles in the past, you can get Shingrix to help prevent future occurrences of  the disease. There is no specific length of time that you need to wait after having shingles before you can receive Shingrix, but generally you should make sure the shingles rash has gone away before getting vaccinated. You can get Shingrix whether or not you remember having had chickenpox in the past. Studies show that more than 99% of Americans 40 years and older have had chickenpox, even  if they don't remember having the disease. Chickenpox and shingles are related because they are caused by the same virus (varicella zoster virus). After a person recovers from chickenpox, the virus stays dormant (inactive) in the body. It can reactivate years later and cause shingles. If you had Zostavax in the recent past, you should wait at least eight weeks before getting Shingrix. Talk to your healthcare provider to determine the best time to get Shingrix. Shingrix is available in [facility] and pharmacies. To find doctor's offices or pharmacies near you that offer the vaccine, visit HealthMap Vaccine FinderExternal. If you have questions about Shingrix, talk with your healthcare provider. Vaccine for Those 7 Years and Older  Shingrix reduces the risk of shingles and PHN by more than 90% in people 36 and older. CDC recommends the vaccine for healthy adults 46 and older.  Who Should Not Get Shingrix? You should not get Shingrix if you: . have ever had a severe allergic reaction to any component of the vaccine or after a dose of Shingrix  . tested negative for immunity to varicella zoster virus. If you test negative, you should get chickenpox vaccine.  . currently have shingles  . currently are pregnant or breastfeeding. Women who are pregnant or breastfeeding should wait to get Shingrix.  [name] receive specific antiviral drugs (acyclovir, famciclovir, or valacyclovir) 24 hours before vaccination (avoid use of these antiviral drugs for 14 days after vaccination)- zoster vaccine live only If you have a minor acute (starts suddenly) illness, such as a cold, you may get Shingrix. But if you have a moderate or severe acute illness, you should usually wait until you recover before getting the vaccine. This includes anyone with a temperature of 101.68F or higher. The side effects of the Shingrix are temporary, and usually last 2 to 3 days. While you may experience pain for a few days after getting  Shingrix, the pain will be less severe than having shingles and the complications from the disease. How Well Does Shingrix Work? Two doses of Shingrix provides strong protection against shingles and postherpetic neuralgia (PHN), the most common complication of shingles. . In adults 1 to 65 years old who got two doses, Shingrix was 97% effective in preventing shingles; among adults 70 years and older, Shingrix was 91% effective.  . In adults 31 to 65 years old who got two doses, Shingrix was 91% effective in preventing PHN; among adults 70 years and older, Shingrix was 89% effective. Shingrix protection remained high (more than 85%) in people 70 years and older throughout the four years following vaccination. Since your risk of shingles and PHN increases as you get older, it is important to have strong protection against shingles in your older years. Top of Page  What Are the Possible Side Effects of Shingrix? Studies show that Shingrix is safe. The vaccine helps your body create a strong defense against shingles. As a result, you are likely to have temporary side effects from getting the shots. The side effects may affect your ability to do normal daily activities for 2 to 3 days. Most people  got a sore arm with mild or moderate pain after getting Shingrix, and some also had redness and swelling where they got the shot. Some people felt tired, had muscle pain, a headache, shivering, fever, stomach pain, or nausea. About 1 out of 6 people who got Shingrix experienced side effects that prevented them from doing regular activities. Symptoms went away on their own in about 2 to 3 days. Side effects were more common in younger people. You might have a reaction to the first or second dose of Shingrix, or both doses. If you experience side effects, you may choose to take over-the-counter pain medicine such as ibuprofen or acetaminophen. If you experience side effects from Shingrix, you should report them to the  Vaccine Adverse Event Reporting System (VAERS). Your doctor might file this report, or you can do it yourself through the VAERS websiteExternal, or by calling [phone]. If you have any questions about side effects from Shingrix, talk with your doctor. The shingles vaccine does not contain thimerosal (a preservative containing mercury). Top of Page  When Should I See a Doctor Because of the Side Effects I Experience From Shingrix? In clinical trials, Shingrix was not associated with serious adverse events. In fact, serious side effects from vaccines are extremely rare. For example, for every 1 million doses of a vaccine given, only one or two people may have a severe allergic reaction. Signs of an allergic reaction happen within minutes or hours after vaccination and include hives, swelling of the face and throat, difficulty breathing, a fast heartbeat, dizziness, or weakness. If you experience these or any other life-threatening symptoms, see a doctor right away. Shingrix causes a strong response in your immune system, so it may produce short-term side effects more intense than you are used to from other vaccines. These side effects can be uncomfortable, but they are expected and usually go away on their own in 2 or 3 days. Top of Page  How Can I Pay For Shingrix? There are several ways shingles vaccine may be paid for: Medicare . Medicare Part D plans cover the shingles vaccine, but there may be a cost to you depending on your plan. There may be a copay for the vaccine, or you may need to pay in full then get reimbursed for a certain amount.  . Medicare Part B does not cover the shingles vaccine. Medicaid . Medicaid may or may not cover the vaccine. Contact your insurer to find out. Private health insurance . Many private health insurance plans will cover the vaccine, but there may be a cost to you depending on your plan. Contact your insurer to find out. Vaccine assistance programs . Some  pharmaceutical companies provide vaccines to eligible adults who cannot afford them. You may want to check with the vaccine manufacturer, [employer], about Shingrix. If you do not currently have health insurance, learn more about affordable health coverage optionsExternal. To find doctor's offices or pharmacies near you that offer the vaccine, visit HealthMap Vaccine FinderExternal.   Aim for 7+ servings of fruits and vegetables daily   65+ fluid ounces of water for healthy kidneys   Limit animal fats in diet for cholesterol and heart health - choose grass fed whenever available   Aim for low stress - take time to unwind and care for your mental health   Aim for 150 min of moderate intensity exercise weekly for heart health, and weights twice weekly for bone health   Aim for 7-9 hours of sleep daily  When it comes to diets, agreement about the perfect plan isn't easy to find, even among the experts. Experts at the Home developed an idea known as the Healthy Eating Plate. Just imagine a plate divided into logical, healthy portions.   The emphasis is on diet quality:   Load up on vegetables and fruits - one-half of your plate: Aim for color and variety, and remember that white potatoes are high starch and should be consumed in moderation.     Go for whole grains - one-quarter of your plate: Whole wheat, barley, wheat berries, quinoa, oats, brown rice, and foods made with them. If you want pasta, go with whole wheat pasta as serving sized noted on package.   Protein power - one-quarter of your plate: Fish, chicken, beans, and nuts are all healthy, versatile protein sources. Limit red meat.   The diet, however, does go beyond the plate, offering a few other suggestions.   Use healthy plant oils, such as olive, canola, soy, corn, sunflower and peanut. Check the labels, and avoid partially hydrogenated oil, which have unhealthy trans fats.  This  will help your cholesterol and heart health.   If you're thirsty, drink water.  Aim for 80 oz of water a day.  Coffee and tea are ok in moderation, but skip sugary drinks (juices, sodas, sweet tea) and limit milk and dairy products to one or two daily servings.   The type of carbohydrate in the diet is more important than the amount. Some sources of carbohydrates, such as vegetables, fruits, whole grains, and beans-are healthier than others.  If you are tech-savvy, check out [employer] for tips for replacing current unhealthy food choices, eating healthy on a budget, recipes and more.   Finally, stay active! Walking is an activity, involve family and friends.  It is FREE and maintains and or improves your health.     Vit D  & Vit C 1,000 mg   are recommended to help protect  against the Covid-19 and other Corona viruses.    Also it's recommended  to take  Zinc 50 mg  to help  protect against the Covid-19   and best place to get  is also on [employer]  and don't pay more than 6-8 cents /pill !  ================================ Coronavirus (COVID-19) Are you at risk?  Are you at risk for the Coronavirus (COVID-19)?  To be considered HIGH RISK for Coronavirus (COVID-19), you have to meet the following criteria:  . Traveled to [place], [place], [place], [place] or [place]; or in the [place] to [place], [place], [place]  . or [place]; and have fever, cough, and shortness of breath within the last 2 weeks of travel OR . Been in close contact with a person diagnosed with COVID-19 within the last 2 weeks and have  . fever, cough,and shortness of breath .  . IF YOU DO NOT MEET THESE CRITERIA, YOU ARE CONSIDERED LOW RISK FOR COVID-19.  What to do if you are HIGH RISK for COVID-19?  [name] If you are having a medical emergency, call 911. . Seek medical care right away. Before you go to a doctor's office, urgent care or emergency department, .  call ahead and tell them  about your recent travel, contact with someone diagnosed with COVID-19  .  and your symptoms.  . You should receive instructions from your physician's office regarding next steps of care.  . When you arrive at healthcare provider, tell  the healthcare staff immediately you have returned from  . visiting [place], [place], [place], [place] or [place]; or traveled in the [place] to [place], [place],  . [place] or [place] in the last two weeks or you have been in close contact with a person diagnosed with  . COVID-19 in the last 2 weeks.   . Tell the health care staff about your symptoms: fever, cough and shortness of breath. . After you have been seen by a medical provider, you will be either: o Tested for (COVID-19) and discharged home on quarantine except to seek medical care if  o symptoms worsen, and asked to  - Stay home and avoid contact with others until you get your results (4-5 days)  - Avoid travel on public transportation if possible (such as bus, train, or airplane) or o Sent to the Emergency Department by EMS for evaluation, COVID-19 testing  and  o possible admission depending on your condition and test results.  What to do if you are LOW RISK for COVID-19?  Reduce your risk of any infection by using the same precautions used for avoiding the common cold or flu:  [name] Wash your hands often with soap and warm water for at least 20 seconds.  If soap and water are not readily available,  . use an alcohol-based hand sanitizer with at least 60% alcohol.  . If coughing or sneezing, cover your mouth and nose by coughing or sneezing into the elbow areas of your shirt or coat, .  into a tissue or into your sleeve (not your hands). . Avoid shaking hands with others and consider head nods or verbal greetings only. . Avoid touching your eyes, nose, or mouth with unwashed hands.  . Avoid close contact with people who are sick. . Avoid places or events with large numbers of people in  one location, like concerts or sporting events. . Carefully consider travel plans you have or are making. . If you are planning any travel outside or inside the [place], visit the CDC's Travelers' Health webpage for the latest health notices. . If you have some symptoms but not all symptoms, continue to monitor at home and seek medical attention  . if your symptoms worsen. . If you are having a medical emergency, call 911. >>>>>>>>>>>>>>>>>>>>>>>>>>>>>>>>>>>>>>>>>>>>>>>>>>>>>>> We Do NOT Approve of  [facility] [place] Soliciting Our Patients  To Do Home Visits  & We Do NOT Approve of [employer] > > > > > > > > > > > > > > > > > > > > > > > > > > > > > > > > > > >  > > > >   Preventive Care for Adults  A healthy lifestyle and preventive care can promote health and wellness. Preventive health guidelines for men include the following key practices:  A routine yearly physical is a good way to check with your health care provider about your health and preventative screening. It is a chance to share any concerns and updates on your health and to receive a thorough exam.  Visit your dentist for a routine exam and preventative care every 6 months. Brush your teeth twice a day and floss once a day. Good oral hygiene prevents tooth decay and gum disease.  The frequency of eye exams is based on your age, health, family medical history, use of contact lenses, and other factors. Follow your health care provider's recommendations for frequency of eye exams.  Eat a healthy diet. Foods such as vegetables, fruits, whole grains, low-fat dairy products, and lean protein foods contain the nutrients you need without too many calories. Decrease your intake of foods high in solid fats, added sugars, and salt. Eat the right amount of calories for you. Get information about a proper diet from your health care provider, if necessary.  Regular physical exercise is one of the most important things  you can do for your health. Most adults should get at least 150 minutes of moderate-intensity exercise (any activity that increases your heart rate and causes you to sweat) each week. In addition, most adults need muscle-strengthening exercises on 2 or more days a week.  Maintain a healthy weight. The body mass index (BMI) is a screening tool to identify possible weight problems. It provides an estimate of body fat based on height and weight. Your health care provider can find your BMI and can help you achieve or maintain a healthy weight. For adults 20 years and older:  A BMI below 18.5 is considered underweight.  A BMI of 18.5 to 24.9 is normal.  A BMI of 25 to 29.9 is considered overweight.  A BMI of 30 and above is considered obese.  Maintain normal blood lipids and cholesterol levels by exercising and minimizing your intake of saturated fat. Eat a balanced diet with plenty of fruit and vegetables. Blood tests for lipids and cholesterol should begin at age 66 and be repeated every 5 years. If your lipid or cholesterol levels are high, you are over 50, or you are at high risk for heart disease, you may need your cholesterol levels checked more frequently. Ongoing high lipid and cholesterol levels should be treated with medicines if diet and exercise are not working.  If you smoke, find out from your health care provider how to quit. If you do not use tobacco, do not start.  Lung cancer screening is recommended for adults aged 60-80 years who are at high risk for developing lung cancer because of a history of smoking. A yearly low-dose CT scan of the lungs is recommended for people who have at least a 30-pack-year history of smoking and are a current smoker or have quit within the past 15 years. A pack year of smoking is smoking an average of 1 pack of cigarettes a day for 1 year (for example: 1 pack a day for 30 years or 2 packs a day for 15 years). Yearly screening should continue until the  smoker has stopped smoking for at least 15 years. Yearly screening should be stopped for people who develop a health problem that would prevent them from having lung cancer treatment.  If you choose to drink alcohol, do not have more than 2 drinks per day. One drink is considered to be 12 ounces (355 mL) of beer, 5 ounces (148 mL) of wine, or 1.5 ounces (44 mL) of liquor.  Avoid use of street drugs. Do not share needles with anyone. Ask for help if you need support or instructions about stopping the use of drugs.  High blood pressure causes heart disease and increases the risk of stroke. Your blood pressure should be checked at least every 1-2 years. Ongoing high blood pressure should be treated with medicines, if weight loss and exercise are not effective.  If you are 46-21 years old, ask your health care provider if you should take aspirin to prevent heart disease.  Diabetes screening involves taking a blood sample to check your fasting  blood sugar level. Testing should be considered at a younger age or be carried out more frequently if you are overweight and have at least 1 risk factor for diabetes.  Colorectal cancer can be detected and often prevented. Most routine colorectal cancer screening begins at the age of 13 and continues through age 69. However, your health care provider may recommend screening at an earlier age if you have risk factors for colon cancer. On a yearly basis, your health care provider may provide home test kits to check for hidden blood in the stool. Use of a small camera at the end of a tube to directly examine the colon (sigmoidoscopy or colonoscopy) can detect the earliest forms of colorectal cancer. Talk to your health care provider about this at age 22, when routine screening begins. Direct exam of the colon should be repeated every 5-10 years through age 15, unless early forms of precancerous polyps or small growths are found.  Hepatitis C blood testing is recommended  for all people born from [birth year] through 1967 and any individual with known risks for hepatitis C.  Screening for abdominal aortic aneurysm (AAA)  by ultrasound is recommended for people who have history of high blood pressure or who are current or former smokers.  Healthy men should  receive prostate-specific antigen (PSA) blood tests as part of routine cancer screening. Talk with your health care provider about prostate cancer screening.  Testicular cancer screening is  recommended for adult males. Screening includes self-exam, a health care provider exam, and other screening tests. Consult with your health care provider about any symptoms you have or any concerns you have about testicular cancer.  Use sunscreen. Apply sunscreen liberally and repeatedly throughout the day. You should seek shade when your shadow is shorter than you. Protect yourself by wearing long sleeves, pants, a wide-brimmed hat, and sunglasses year round, whenever you are outdoors.  Once a month, do a whole-body skin exam, using a mirror to look at the skin on your back. Tell your health care provider about new moles, moles that have irregular borders, moles that are larger than a pencil eraser, or moles that have changed in shape or color.  Stay current with required vaccines (immunizations).  Influenza vaccine. All adults should be immunized every year.  Tetanus, diphtheria, and acellular pertussis (Td, Tdap) vaccine. An adult who has not previously received Tdap or who does not know his vaccine status should receive 1 dose of Tdap. This initial dose should be followed by tetanus and diphtheria toxoids (Td) booster doses every 10 years. Adults with an unknown or incomplete history of completing a 3-dose immunization series with Td-containing vaccines should begin or complete a primary immunization series including a Tdap dose. Adults should receive a Td booster every 10 years.  Zoster vaccine. One dose is recommended for  adults aged 19 years or older unless certain conditions are present.    PREVNAR - Pneumococcal 13-valent conjugate (PCV13) vaccine. When indicated, a person who is uncertain of his immunization history and has no record of immunization should receive the PCV13 vaccine. An adult aged 11 years or older who has certain medical conditions and has not been previously immunized should receive 1 dose of PCV13 vaccine. This PCV13 should be followed with a dose of pneumococcal polysaccharide (PPSV23) vaccine. The PPSV23 vaccine dose should be obtained 1 or more year(s)after the dose of PCV13 vaccine. An adult aged 64 years or older who has certain medical conditions and previously received 1 or more  doses of PPSV23 vaccine should receive 1 dose of PCV13. The PCV13 vaccine dose should be obtained 1 or more years after the last PPSV23 vaccine dose.    PNEUMOVAX - Pneumococcal polysaccharide (PPSV23) vaccine. When PCV13 is also indicated, PCV13 should be obtained first. All adults aged 46 years and older should be immunized. An adult younger than age 37 years who has certain medical conditions should be immunized. Any person who resides in a nursing home or long-term care facility should be immunized. An adult smoker should be immunized. People with an immunocompromised condition and certain other conditions should receive both PCV13 and PPSV23 vaccines. People with human immunodeficiency virus (HIV) infection should be immunized as soon as possible after diagnosis. Immunization during chemotherapy or radiation therapy should be avoided. Routine use of PPSV23 vaccine is not recommended for American Indians, [place] Natives, or people younger than 65 years unless there are medical conditions that require PPSV23 vaccine. When indicated, people who have unknown immunization and have no record of immunization should receive PPSV23 vaccine. One-time revaccination 5 years after the first dose of PPSV23 is recommended for  people aged 19-64 years who have chronic kidney failure, nephrotic syndrome, asplenia, or immunocompromised conditions. People who received 1-2 doses of PPSV23 before age 65 years should receive another dose of PPSV23 vaccine at age 52 years or later if at least 5 years have passed since the previous dose. Doses of PPSV23 are not needed for people immunized with PPSV23 at or after age 70 years.    Hepatitis A vaccine. Adults who wish to be protected from this disease, have certain high-risk conditions, work with hepatitis A-infected animals, work in hepatitis A research labs, or travel to or work in countries with a high rate of hepatitis A should be immunized. Adults who were previously unvaccinated and who anticipate close contact with an international adoptee during the first 60 days after arrival in the [place] from a country with a high rate of hepatitis A should be immunized.    Hepatitis B vaccine. Adults should be immunized if they wish to be protected from this disease, have certain high-risk conditions, may be exposed to blood or other infectious body fluids, are household contacts or sex partners of hepatitis B positive people, are clients or workers in certain care facilities, or travel to or work in countries with a high rate of hepatitis B.   Preventive Service / Frequency   Ages 34 and over  Blood pressure check.  Lipid and cholesterol check.  Lung cancer screening. / Every year if you are aged 57-80 years and have a 30-pack-year history of smoking and currently smoke or have quit within the past 15 years. Yearly screening is stopped once you have quit smoking for at least 15 years or develop a health problem that would prevent you from having lung cancer treatment.  Fecal occult blood test (FOBT) of stool. You may not have to do this test if you get a colonoscopy every 10 years.  Flexible sigmoidoscopy** or colonoscopy.** / Every 5 years for a flexible sigmoidoscopy or  every 10 years for a colonoscopy beginning at age 45 and continuing until age 34.  Hepatitis C blood test.** / For all people born from [birth year] through 1967 and any individual with known risks for hepatitis C.  Abdominal aortic aneurysm (AAA) screening./ Screening current or former smokers or have Hypertension.  Skin self-exam. / Monthly.  Influenza vaccine. / Every year.  Tetanus, diphtheria, and acellular pertussis (Tdap/Td) vaccine.** /  1 dose of Td every 10 years.   Zoster vaccine.** / 1 dose for adults aged 5 years or older.  Shingrix vaccination, 2 doses, even if you have had the Zoster vaccination.         Pneumococcal 13-valent conjugate (PCV13) vaccine.    Pneumococcal polysaccharide (PPSV23) vaccine.     Hepatitis A vaccine.** / Consult your health care provider.  Hepatitis B vaccine.** / Consult your health care provider. Screening for abdominal aortic aneurysm (AAA)  by ultrasound is recommended for people who have history of high blood pressure or who are current or former smokers. ++++++++++ Recommend Adult Low Dose Aspirin or  coated  Aspirin 81 mg daily  To reduce risk of Colon Cancer 40 %,  Skin Cancer 26 % ,  Malignant Melanoma 46%  and  Pancreatic cancer 60% ++++++++++++++++++++++ Vitamin D goal  is between 70-100.  Please make sure that you are taking your Vitamin D as directed.  It is very important as a natural anti-inflammatory  helping hair, skin, and nails, as well as reducing stroke and heart attack risk.  It helps your bones and helps with mood. It also decreases numerous cancer risks so please take it as directed.  Low Vit D is associated with a 200-300% higher risk for CANCER  and 200-300% higher risk for HEART   ATTACK  &  STROKE.   .....................................[name] It is also associated with higher death rate at younger ages,  autoimmune diseases like Rheumatoid arthritis, Lupus, Multiple Sclerosis.    Also many other serious  conditions, like depression, Alzheimer's Dementia, infertility, muscle aches, fatigue, fibromyalgia - just to name a few. ++++++++++++++++++++++ Recommend the book "The END of DIETING" by Dr Excell Seltzer  & the book "The END of DIABETES " by Dr Excell Seltzer At [email or web address] - get book & Audio CD's    Being diabetic has a  300% increased risk for heart attack, stroke, cancer, and alzheimer- type vascular dementia. It is very important that you work harder with diet by avoiding all foods that are white. Avoid white rice (brown & wild rice is OK), white potatoes (sweetpotatoes in moderation is OK), White bread or wheat bread or anything made out of white flour like bagels, donuts, rolls, buns, biscuits, cakes, pastries, cookies, pizza crust, and pasta (made from white flour & egg whites) - vegetarian pasta or spinach or wheat pasta is OK. Multigrain breads like Arnold's or Pepperidge [employer], or multigrain sandwich thins or flatbreads.  Diet, exercise and weight loss can reverse and cure diabetes in the early stages.  Diet, exercise and weight loss is very important in the control and prevention of complications of diabetes which affects every system in your body, ie. Brain - dementia/stroke, eyes - glaucoma/blindness, heart - heart attack/heart failure, kidneys - dialysis, stomach - gastric paralysis, intestines - malabsorption, nerves - severe painful neuritis, circulation - gangrene & loss of a leg(s), and finally cancer and Alzheimers.    I recommend avoid fried & greasy foods,  sweets/candy, white rice (brown or wild rice or Quinoa is OK), white potatoes (sweet potatoes are OK) - anything made from white flour - bagels, doughnuts, rolls, buns, biscuits,white and wheat breads, pizza crust and traditional pasta made of white flour & egg white(vegetarian pasta or spinach or wheat pasta is OK).  Multi-grain bread is OK - like multi-grain flat bread or sandwich thins. Avoid alcohol in excess. Exercise is also  important.    Eat all the vegetables you want -  avoid meat, especially red meat and dairy - especially cheese.  Cheese is the most concentrated form of trans-fats which is the worst thing to clog up our arteries. Veggie cheese is OK which can be found in the fresh produce section at [employer] or [employer] or [employer]  ++++++++++++++++++++++ DASH Eating Plan  DASH stands for "Dietary Approaches to Stop Hypertension."   The DASH eating plan is a healthy eating plan that has been shown to reduce high blood pressure (hypertension). Additional health benefits may include reducing the risk of type 2 diabetes mellitus, heart disease, and stroke. The DASH eating plan may also help with weight loss. WHAT DO I NEED TO KNOW ABOUT THE DASH EATING PLAN? For the DASH eating plan, you will follow these general guidelines:  Choose foods with a percent daily value for sodium of less than 5% (as listed on the food label).  Use salt-free seasonings or herbs instead of table salt or sea salt.  Check with your health care provider or pharmacist before using salt substitutes.  Eat lower-sodium products, often labeled as "lower sodium" or "no salt added."  Eat fresh foods.  Eat more vegetables, fruits, and low-fat dairy products.  Choose whole grains. Look for the word "whole" as the first word in the ingredient list.  Choose fish   Limit sweets, desserts, sugars, and sugary drinks.  Choose heart-healthy fats.  Eat veggie cheese   Eat more home-cooked food and less restaurant, buffet, and fast food.  Limit fried foods.  Cook foods using methods other than frying.  Limit canned vegetables. If you do use them, rinse them well to decrease the sodium.  When eating at a restaurant, ask that your food be prepared with less salt, or no salt if possible.                      WHAT FOODS CAN I EAT? Read Dr Fara Olden Fuhrman's books on The End of Dieting & The End of Diabetes  Grains Whole grain or  whole wheat bread. Brown rice. Whole grain or whole wheat pasta. Quinoa, bulgur, and whole grain cereals. Low-sodium cereals. Corn or whole wheat flour tortillas. Whole grain cornbread. Whole grain crackers. Low-sodium crackers.  Vegetables Fresh or frozen vegetables (raw, steamed, roasted, or grilled). Low-sodium or reduced-sodium tomato and vegetable juices. Low-sodium or reduced-sodium tomato sauce and paste. Low-sodium or reduced-sodium canned vegetables.   Fruits All fresh, canned (in natural juice), or frozen fruits.  Protein Products  All fish and seafood.  Dried beans, peas, or lentils. Unsalted nuts and seeds. Unsalted canned beans.  Dairy Low-fat dairy products, such as skim or 1% milk, 2% or reduced-fat cheeses, low-fat ricotta or cottage cheese, or plain low-fat yogurt. Low-sodium or reduced-sodium cheeses.  Fats and Oils Tub margarines without trans fats. Light or reduced-fat mayonnaise and salad dressings (reduced sodium). Avocado. Safflower, olive, or canola oils. Natural peanut or almond butter.  Other Unsalted popcorn and pretzels. The items listed above may not be a complete list of recommended foods or beverages. Contact your dietitian for more options.  ++++++++++++++++++++  WHAT FOODS ARE NOT RECOMMENDED? Grains/ White flour or wheat flour White bread. White pasta. White rice. Refined cornbread. Bagels and croissants. Crackers that contain trans fat.  Vegetables  Creamed or fried vegetables. Vegetables in a . Regular canned vegetables. Regular canned tomato sauce and paste. Regular tomato and vegetable juices.  Fruits Dried fruits. Canned fruit in light or heavy syrup. Fruit juice.  Meat  and Other Protein Products Meat in general - RED meat & White meat.  Fatty cuts of meat. Ribs, chicken wings, all processed meats as bacon, sausage, bologna, salami, fatback, hot dogs, bratwurst and packaged luncheon meats.  Dairy Whole or 2% milk, cream, half-and-half, and  cream cheese. Whole-fat or sweetened yogurt. Full-fat cheeses or blue cheese. Non-dairy creamers and whipped toppings. Processed cheese, cheese spreads, or cheese curds.  Condiments Onion and garlic salt, seasoned salt, table salt, and sea salt. Canned and packaged gravies. Worcestershire sauce. Tartar sauce. Barbecue sauce. Teriyaki sauce. Soy sauce, including reduced sodium. Steak sauce. Fish sauce. Oyster sauce. Cocktail sauce. Horseradish. Ketchup and mustard. Meat flavorings and tenderizers. Bouillon cubes. Hot sauce. Tabasco sauce. Marinades. Taco seasonings. Relishes.  Fats and Oils Butter, stick margarine, lard, shortening and bacon fat. Coconut, palm kernel, or palm oils. Regular salad dressings.  Pickles and olives. Salted popcorn and pretzels.  The items listed above may not be a complete list of foods and beverages to avoid.

## 2018-11-27 LAB — COMPLETE METABOLIC PANEL WITH GFR
AG Ratio: 1.8 (calc) (ref 1.0–2.5)
ALT: 14 U/L (ref 9–46)
AST: 18 U/L (ref 10–35)
Albumin: 4.4 g/dL (ref 3.6–5.1)
Alkaline phosphatase (APISO): 69 U/L (ref 35–144)
BUN: 15 mg/dL (ref 7–25)
CO2: 26 mmol/L (ref 20–32)
Calcium: 9.9 mg/dL (ref 8.6–10.3)
Chloride: 103 mmol/L (ref 98–110)
Creat: 1.03 mg/dL (ref 0.70–1.25)
GFR, Est African American: 88 mL/min/{1.73_m2} (ref >=60)
GFR, Est Non African American: 76 mL/min/{1.73_m2} (ref >=60)
Globulin: 2.4 g/dL (calc) (ref 1.9–3.7)
Glucose, Bld: 205 mg/dL — ABNORMAL HIGH (ref 65–99)
Potassium: 4.7 mmol/L (ref 3.5–5.3)
Sodium: 140 mmol/L (ref 135–146)
Total Bilirubin: 0.3 mg/dL (ref 0.2–1.2)
Total Protein: 6.8 g/dL (ref 6.1–8.1)

## 2018-11-27 LAB — MICROALBUMIN / CREATININE URINE RATIO
Creatinine, Urine: 121 mg/dL (ref 20–320)
Microalb Creat Ratio: 115 mcg/mg creat — ABNORMAL HIGH (ref <30)
Microalb, Ur: 13.9 mg/dL

## 2018-11-27 LAB — LIPID PANEL
Cholesterol: 109 mg/dL (ref <200)
HDL: 34 mg/dL — ABNORMAL LOW (ref >=40)
LDL Cholesterol (Calc): 43 mg/dL (calc)
Non-HDL Cholesterol (Calc): 75 mg/dL (calc) (ref <130)
Total CHOL/HDL Ratio: 3.2 (calc) (ref <5.0)
Triglycerides: 271 mg/dL — ABNORMAL HIGH (ref <150)

## 2018-11-27 LAB — CBC WITH DIFFERENTIAL/PLATELET
Absolute Monocytes: 548 cells/uL (ref 200–950)
Basophils Absolute: 51 {cells}/uL (ref 0–200)
Basophils Relative: 0.7 %
Eosinophils Absolute: 212 cells/uL (ref 15–500)
Eosinophils Relative: 2.9 %
HCT: 43.1 % (ref 38.5–50.0)
Hemoglobin: 14.2 g/dL (ref 13.2–17.1)
Lymphs Abs: 1380 cells/uL (ref 850–3900)
MCH: 28.9 pg (ref 27.0–33.0)
MCHC: 32.9 g/dL (ref 32.0–36.0)
MCV: 87.8 fL (ref 80.0–100.0)
MPV: 10.4 fL (ref 7.5–12.5)
Monocytes Relative: 7.5 %
Neutro Abs: 5110 cells/uL (ref 1500–7800)
Neutrophils Relative %: 70 %
Platelets: 260 10*3/uL (ref 140–400)
RBC: 4.91 10*6/uL (ref 4.20–5.80)
RDW: 14.6 % (ref 11.0–15.0)
Total Lymphocyte: 18.9 %
WBC: 7.3 10*3/uL (ref 3.8–10.8)

## 2018-11-27 LAB — HEMOGLOBIN A1C
Hgb A1c MFr Bld: 8 % of total Hgb — ABNORMAL HIGH (ref <5.7)
Mean Plasma Glucose: 183 (calc)
eAG (mmol/L): 10.1 (calc)

## 2018-11-27 LAB — TSH: TSH: 2.29 mIU/L (ref 0.40–4.50)

## 2018-11-27 LAB — MAGNESIUM: Magnesium: 1.6 mg/dL (ref 1.5–2.5)

## 2018-11-27 LAB — PSA: PSA: <0.1 ng/mL (ref <=4.0)

## 2018-11-27 LAB — VITAMIN B12: Vitamin B-12: >2000 pg/mL — ABNORMAL HIGH (ref 200–1100)

## 2018-11-27 LAB — VITAMIN D 25 HYDROXY (VIT D DEFICIENCY, FRACTURES): Vit D, 25-Hydroxy: 82 ng/mL (ref 30–100)

## 2018-11-28 ENCOUNTER — Telehealth: Payer: Self-pay

## 2018-11-28 MED ORDER — AZITHROMYCIN 250 MG PO TABS: ORAL_TABLET | ORAL | 0 refills | Status: DC

## 2018-11-28 NOTE — Telephone Encounter (Signed)
Patient states that he is not feeling better and is requesting an antibiotic. Please advise.

## 2018-11-28 NOTE — Telephone Encounter (Signed)
Yes, [facility]

## 2018-11-28 NOTE — Telephone Encounter (Signed)
Patient aware.

## 2018-11-28 NOTE — Addendum Note (Signed)
Addended byGarnet Sierras A on: 11/28/2018 02:38 PM   Modules accepted: Orders

## 2018-11-29 ENCOUNTER — Other Ambulatory Visit: Payer: Self-pay

## 2018-11-29 ENCOUNTER — Other Ambulatory Visit: Payer: Medicare Other | Admitting: Orthotics

## 2018-12-09 NOTE — Patient Instructions (Signed)

## 2018-12-09 NOTE — Progress Notes (Signed)
History of Present Illness:     This very nice 63 y.o. MWM   with  Morbid Obesity (BMI 42+) and history of T2_NIDDM since 2004 & denies symptoms of reactive hypoglycemia, diabetic polys or visual blurring, but does endorse burning dysthesias of his feet.  His Diabetic control is historically poor consequent of his  Gluttony and last A1c was not at goal:  Lab Results  Component Value Date   HGBA1C 8.0 (H) 11/26/2018   Medications  .  metFORMIN (GLUCOPHAGE-XR) 500 MG 24 hr tablet, Take 2 tablets 2 x /day after meals  for Diabetes .  enalapril (VASOTEC) 20 MG tablet, Take 1 tablet 2 x /day for BP .  rosuvastatin (CRESTOR) 5 MG tablet, Take 1 tablet Daily for Cholesterol .  dextromethorphan-guaiFENesin (MUCINEX DM) 30-600 MG 12hr tablet, Take 1 tablet by mouth 2 (two) times daily as needed for cough. .  fexofenadine (ALLEGRA) 180 MG tablet, Take 180 mg by mouth daily as needed for allergies.  [name]  acetaminophen (TYLENOL) 500 MG tablet, Take 500 mg by mouth every 6 (six) hours as needed for moderate pain. [name]  allopurinol (ZYLOPRIM) 300 MG tablet, Take 1 tablet Daily to Prevent Gout .  aspirin 81 MG tablet, Take 81 mg by mouth daily. .  meloxicam (MOBIC) 7.5 MG tablet, Take 7.5 mg by mouth as needed for pain. [name]  acyclovir (ZOVIRAX) 400 MG tablet, TAKE 1 TABLET BY MOUTH  DAILY AS NEEDED FOR COLD  SORES .  carbidopa-levodopa (SINEMET IR) 25-250 MG tablet, Take 1 tablet 2 x /day for Restless Legs .  Docusate Calcium (STOOL SOFTENER PO), Take 250 mg by mouth daily.  .  DULoxetine (CYMBALTA) 60 MG capsule, TAKE 1 CAPSULE BY MOUTH AT  BEDTIME .  gabapentin (NEURONTIN) 600 MG tablet, Take 1/2 to 1 tablet 2 to 3 x /Daily as needed for Pain .  glucose blood test strip, Check glucose daily .  Magnesium 500 MG CAPS, Take 1,500 mg by mouth every evening.  .  methocarbamol (ROBAXIN) 500 MG tablet, Take 1 tablet (500 mg total) by mouth daily as needed for muscle spasms. .  Multiple Vitamin (MULTIVITAMIN)  tablet, Take 1 tablet by mouth daily. .  pantoprazole (PROTONIX) 40 MG tablet,  .  sennosides-docusate sodium (SENOKOT-S) 8.6-50 MG tablet, Take 1 tablet by mouth daily. .  tamsulosin (FLOMAX) 0.4 MG CAPS capsule, TAKE 1 CAPSULE BY MOUTH  DAILY AFTER SUPPER .  Vitamin D, Ergocalciferol, (DRISDOL) 50000 units CAPS capsule, Take 1 capsule (50,000 Units total) by mouth as directed. Tuesday , Wednesday, Thursday, Sunday (Patient taking differently: Take 50,000 Units by mouth 2 (two) times a week. Wednesday and Sunday)  Problem list He has Atherosclerotic peripheral vascular disease ([place]); CKD stage 2 due to type 2 diabetes mellitus ([place]); Morbid obesity (BMI 41+); Obesity hypoventilation syndrome ([facility]); T2_NIDDM w/ CKD 2 (GFR 78 ml/min); Gout; Hyperlipidemia associated with type 2 diabetes mellitus ([place]); Hypertension; RLS; DM type 2 with diabetic peripheral neuropathy ([place]); Normal coronary arteries; Primary osteoarthritis of left knee; Gait instability; Bilateral carpal tunnel syndrome   Observations/Objective:  BP 136/88   Pulse 96   Temp (!) 97.2 F (36.2 C)   Resp 18   Ht 6' 1.5" (1.867 m)   Wt (!) 314 lb (142.4 kg)   BMI 40.87 kg/m   HEENT - WNL. Neck - supple.  Chest - Clear equal BS. Cor - Nl HS. RRR w/o sig MGR. PP equivocally palpable 0-1+. No edema. MS- FROM w/o  deformities except very slight cock-up deformities of the 2sd & 3rd toes bilaterally. No significant calluses or bunions evident. Gait sl broad based. Neuro -  Nl w/o focal abnormalities. Sensation is decreased in a stocking distribution from the proximal shins to touch, vibratory and Monofilament to the toes bilaterally.   Assessment and Plan:  1. DM type 2 with diabetic peripheral neuropathy ([place])  - recommended shoe inserts for cushioning  - Advised patient of importance of daily feet inspection  2. Essential hypertension   3. Atherosclerotic peripheral vascular disease ([place])       I discussed the  assessment and treatment plan with the patient. The patient was provided an opportunity to ask questions and all were answered. The patient agreed with the plan and demonstrated an understanding of the instructions.Between 15-18 minutes of counseling, exam, chart review and decision making was performed     Kirtland Bouchard, MD

## 2018-12-10 ENCOUNTER — Ambulatory Visit (INDEPENDENT_AMBULATORY_CARE_PROVIDER_SITE_OTHER): Payer: Medicare Other | Admitting: Internal Medicine

## 2018-12-10 ENCOUNTER — Encounter: Payer: Self-pay | Admitting: Internal Medicine

## 2018-12-10 ENCOUNTER — Other Ambulatory Visit: Payer: Self-pay

## 2018-12-10 VITALS — BP 136/88 | HR 96 | Temp 97.2°F | Resp 18 | Ht 73.5 in | Wt 314.0 lb

## 2018-12-10 DIAGNOSIS — E1142 Type 2 diabetes mellitus with diabetic polyneuropathy: Secondary | ICD-10-CM

## 2018-12-10 DIAGNOSIS — I70209 Unspecified atherosclerosis of native arteries of extremities, unspecified extremity: Secondary | ICD-10-CM

## 2018-12-10 DIAGNOSIS — I10 Essential (primary) hypertension: Secondary | ICD-10-CM | POA: Diagnosis not present

## 2018-12-16 ENCOUNTER — Encounter: Payer: Self-pay | Admitting: Physician Assistant

## 2018-12-24 ENCOUNTER — Other Ambulatory Visit: Payer: Self-pay | Admitting: Internal Medicine

## 2018-12-25 ENCOUNTER — Encounter: Payer: Medicare Other | Admitting: Adult Health

## 2018-12-30 DIAGNOSIS — E119 Type 2 diabetes mellitus without complications: Secondary | ICD-10-CM | POA: Diagnosis not present

## 2018-12-30 DIAGNOSIS — H52223 Regular astigmatism, bilateral: Secondary | ICD-10-CM | POA: Diagnosis not present

## 2019-01-03 ENCOUNTER — Telehealth: Payer: Self-pay | Admitting: *Deleted

## 2019-01-03 LAB — HM DIABETES EYE EXAM

## 2019-01-03 NOTE — Telephone Encounter (Signed)
error 

## 2019-01-06 ENCOUNTER — Telehealth: Payer: Self-pay | Admitting: *Deleted

## 2019-01-06 NOTE — Telephone Encounter (Signed)
Forms and OV note from 12/10/2018 faxed to [facility] in regard to diabetic shoes.

## 2019-01-28 DIAGNOSIS — M4326 Fusion of spine, lumbar region: Secondary | ICD-10-CM | POA: Diagnosis not present

## 2019-01-28 DIAGNOSIS — M7918 Myalgia, other site: Secondary | ICD-10-CM | POA: Diagnosis not present

## 2019-01-28 DIAGNOSIS — M545 Low back pain: Secondary | ICD-10-CM | POA: Diagnosis not present

## 2019-01-31 ENCOUNTER — Ambulatory Visit: Payer: Medicare Other | Admitting: Orthotics

## 2019-01-31 ENCOUNTER — Other Ambulatory Visit: Payer: Self-pay

## 2019-01-31 DIAGNOSIS — E1142 Type 2 diabetes mellitus with diabetic polyneuropathy: Secondary | ICD-10-CM

## 2019-01-31 DIAGNOSIS — M2041 Other hammer toe(s) (acquired), right foot: Secondary | ICD-10-CM

## 2019-01-31 DIAGNOSIS — M2042 Other hammer toe(s) (acquired), left foot: Secondary | ICD-10-CM

## 2019-02-25 ENCOUNTER — Other Ambulatory Visit: Payer: Self-pay

## 2019-02-25 ENCOUNTER — Encounter: Payer: Self-pay | Admitting: Podiatry

## 2019-02-25 ENCOUNTER — Ambulatory Visit (INDEPENDENT_AMBULATORY_CARE_PROVIDER_SITE_OTHER): Payer: Medicare Other | Admitting: Podiatry

## 2019-02-25 DIAGNOSIS — E1142 Type 2 diabetes mellitus with diabetic polyneuropathy: Secondary | ICD-10-CM | POA: Diagnosis not present

## 2019-02-25 DIAGNOSIS — M79676 Pain in unspecified toe(s): Secondary | ICD-10-CM | POA: Diagnosis not present

## 2019-02-25 DIAGNOSIS — Q828 Other specified congenital malformations of skin: Secondary | ICD-10-CM

## 2019-02-25 DIAGNOSIS — B351 Tinea unguium: Secondary | ICD-10-CM

## 2019-02-25 NOTE — Progress Notes (Signed)
He presents today chief complaint of numbness and tingling in the toes toenails that are thick and painful.  Reactive hyperkeratotic tissue plantar aspect of the forefoot bilateral and distal aspect of the hallux right.  Objective: Vital signs are stable alert and oriented x3 pulses are palpable.  Decreased sensorium per Thornell Mule monofilament plantar aspect of the bilateral foot.  Toenails are thick yellow dystrophic with mycotic and painful elongated.  Reactive hyperkeratotic lesions plantar aspect of the forefoot bilateral and distal aspect of the hallux right.  Assessment: Diabetic peripheral neuropathy with reactive hyperkeratosis and painfully elongated toenails.  Plan: Debridement of all reactive hyperkeratotic lesion bilateral foot.  Debridement of toenails 1 through 5 bilateral.

## 2019-04-03 ENCOUNTER — Ambulatory Visit (INDEPENDENT_AMBULATORY_CARE_PROVIDER_SITE_OTHER): Payer: Medicare Other | Admitting: Internal Medicine

## 2019-04-03 ENCOUNTER — Other Ambulatory Visit: Payer: Self-pay

## 2019-04-03 VITALS — BP 126/80 | HR 80 | Temp 97.0°F | Resp 18 | Ht 73.5 in | Wt 317.0 lb

## 2019-04-03 DIAGNOSIS — M1000 Idiopathic gout, unspecified site: Secondary | ICD-10-CM | POA: Diagnosis not present

## 2019-04-03 DIAGNOSIS — N182 Chronic kidney disease, stage 2 (mild): Secondary | ICD-10-CM

## 2019-04-03 DIAGNOSIS — I10 Essential (primary) hypertension: Secondary | ICD-10-CM

## 2019-04-03 DIAGNOSIS — E1122 Type 2 diabetes mellitus with diabetic chronic kidney disease: Secondary | ICD-10-CM | POA: Diagnosis not present

## 2019-04-03 DIAGNOSIS — E559 Vitamin D deficiency, unspecified: Secondary | ICD-10-CM

## 2019-04-03 DIAGNOSIS — E1169 Type 2 diabetes mellitus with other specified complication: Secondary | ICD-10-CM | POA: Diagnosis not present

## 2019-04-03 DIAGNOSIS — E785 Hyperlipidemia, unspecified: Secondary | ICD-10-CM | POA: Diagnosis not present

## 2019-04-03 MED ORDER — MELOXICAM 7.5 MG PO TABS: ORAL_TABLET | ORAL | 0 refills | Status: DC

## 2019-04-03 NOTE — Progress Notes (Signed)
History of Present Illness:       This very nice 64 y.o.  MWM presents for 3 month follow up with HTN, HLD, Pre-Diabetes and Vitamin D Deficiency.       Patient is treated for HTN & BP has been controlled at home. Today's BP is at goal - 126/80. Patient has had no complaints of any cardiac type chest pain, palpitations, dyspnea / orthopnea / PND, dizziness, claudication, or dependent edema.      Hyperlipidemia is controlled with diet & meds. Patient denies myalgias or other med SE's. Last Lipids were at goal with elevated Trig's:  Lab Results  Component Value Date   CHOL 109 11/26/2018   HDL 34 (L) 11/26/2018   LDLCALC 43 11/26/2018   TRIG 271 (H) 11/26/2018   CHOLHDL 3.2 11/26/2018    Also, the patient has  Morbid Obesity (BMI 42+) and consequent T2_NIDDM and has had no symptoms of reactive hypoglycemia, diabetic polys, paresthesias or visual blurring.  Patient admits poorly compliant diet ad last A1c was not at goal:  Lab Results  Component Value Date   HGBA1C 8.0 (H) 11/26/2018       Further, the patient also has history of Vitamin D Deficiency and supplements vitamin D without any suspected side-effects. Last vitamin D was att goal:  Lab Results  Component Value Date   VD25OH 82 11/26/2018    Current Outpatient Medications on File Prior to Visit  Medication Sig  . Accu-Chek Softclix Lancets lancets USE WITH METER TO CHECK  BLOOD SUGAR ONCE A DAY  . acetaminophen (TYLENOL) 500 MG tablet Take 500 mg by mouth every 6 (six) hours as needed for moderate pain.  [name] acyclovir (ZOVIRAX) 400 MG tablet TAKE 1 TABLET BY MOUTH  DAILY AS NEEDED FOR COLD  SORES  . allopurinol (ZYLOPRIM) 300 MG tablet Take 1 tablet Daily to Prevent Gout  . aspirin 81 MG tablet Take 81 mg by mouth daily.  . carbidopa-levodopa (SINEMET IR) 25-250 MG tablet Take 1 tablet 2 x /day for Restless Legs  . colchicine (COLCRYS) 0.6 MG tablet Take 0.6 mg by mouth as needed.  [name] dextromethorphan-guaiFENesin  (MUCINEX DM) 30-600 MG 12hr tablet Take 1 tablet by mouth 2 (two) times daily as needed for cough.  Mariane Baumgarten Calcium (STOOL SOFTENER PO) Take 250 mg by mouth daily.   . DULoxetine (CYMBALTA) 60 MG capsule TAKE 1 CAPSULE BY MOUTH AT  BEDTIME  . enalapril (VASOTEC) 20 MG tablet Take 1 tablet 2 x /day for BP  . fexofenadine (ALLEGRA) 180 MG tablet Take 180 mg by mouth daily as needed for allergies.   [name] gabapentin (NEURONTIN) 600 MG tablet Take 1/2 to 1 tablet 2 to 3 x /Daily as needed for Pain  . Magnesium 500 MG CAPS Take 1,500 mg by mouth every evening.   . meloxicam (MOBIC) 7.5 MG tablet Take 7.5 mg by mouth as needed for pain.  . metFORMIN (GLUCOPHAGE-XR) 500 MG 24 hr tablet Take 2 tablets 2 x /day with Meals for Diabetes  . methocarbamol (ROBAXIN) 500 MG tablet Take 1 tablet (500 mg total) by mouth daily as needed for muscle spasms.  . Multiple Vitamin (MULTIVITAMIN) tablet Take 1 tablet by mouth daily.  Glory Rosebush ULTRA test strip USE TO CHECK BLOOD SUGAR  DAILY  . pantoprazole (PROTONIX) 40 MG tablet Take 1 tablet Daily for Heartburn & Indigestion  . rosuvastatin (CRESTOR) 5 MG tablet Take 1 tablet Daily for Cholesterol  .  sennosides-docusate sodium (SENOKOT-S) 8.6-50 MG tablet Take 1 tablet by mouth daily.  . tamsulosin (FLOMAX) 0.4 MG CAPS capsule TAKE 1 CAPSULE BY MOUTH  DAILY AFTER SUPPER  . Vitamin D, Ergocalciferol, (DRISDOL) 50000 units CAPS capsule Take 1 capsule (50,000 Units total) by mouth as directed. Tuesday , Wednesday, Thursday, Sunday (Patient taking differently: Take 50,000 Units by mouth 2 (two) times a week. Wednesday and Sunday)   No current facility-administered medications on file prior to visit.    Allergies  Allergen Reactions  . Flaxseed [Linseed Oil] Rash  . Lyrica [Pregabalin] Itching and Rash  . Niaspan [Niacin Er] Rash    PMHx:   Past Medical History:  Diagnosis Date  . Acute meniscal tear of left knee   . Allergic rhinitis   . Arthritis     back    . Borderline glaucoma   . Chronic back pain    stenosis  . CKD (chronic kidney disease), stage II   . Complication of anesthesia cervical fusion surgery 06/2010   "woke up next day w/ventilator on" told due to OSA  . GERD (gastroesophageal reflux disease)    takes Prevacid daily  . Gout    takes Allopurinol daily and Colchicine if needed  . History of adenomatous polyp of colon    tubular adenoma's  . History of Barrett's esophagus   . History of hiatal hernia   . Hyperlipidemia   . Hypertension   . Incomplete right bundle branch block (RBBB)   . OA (osteoarthritis)    knee  . OSA on CPAP   . Peripheral neuropathy   . Prostate cancer [facility]) urologist-  dr Alinda Money   dx 2013  s/p  radical non-nerve sparing prostatectomy 2014--  Stage pT2cNx,  PSA 3.48,  Gleason 3+3,  vol 55cc/  current PSA  0.01 (Aug 2016)  . Rupture of Achilles tendon   . Thyroid nodule    left side  . Type II diabetes mellitus ([place])   . Urinary frequency    takes Flomax daily  . Urine incontinence   . Wears glasses     Immunization History  Administered Date(s) Administered  . Influenza Inj Mdck Quad With Preservative 11/22/2016, 12/04/2017  . Influenza Split 10/17/2013, 10/29/2014  . Influenza, High Dose Seasonal PF 11/26/2018  . Influenza, Seasonal, Injecte, Preservative Fre 11/29/2015  . Influenza-Unspecified 11/04/2012  . PPD Test 01/16/2006, 04/07/2013  . Pneumococcal Conjugate-13 07/29/2018  . Pneumococcal Polysaccharide-23 04/07/2013, 11/22/2016  . Pneumococcal-Unspecified 09/17/2010  . Td 01/17/2011  . Zoster 01/16/2005    Past Surgical History:  Procedure Laterality Date  . ACHILLES TENDON SURGERY Left 06/27/2017   Procedure: ACHILLES TENDON REPAIR;  Surgeon: Evelina Bucy, DPM;  Location: [facility];  Service: Podiatry;  Laterality: Left;  . ANAL FISSURE REPAIR  06-05-2002  . BACK SURGERY    . CARDIAC CATHETERIZATION    . CARDIOVASCULAR STRESS TEST  01-01-2008   normal nuclear study/  no  ischemia/  normal LV function and wall motion , 57%  . ESOPHAGOGASTRODUODENOSCOPY  09/26/2011   Procedure: ESOPHAGOGASTRODUODENOSCOPY (EGD);  Surgeon: Cleotis Nipper, MD;  Location: [facility];  Service: Endoscopy;  Laterality: N/A;  . GASTROC RECESSION EXTREMITY Left 06/27/2017   Procedure: GASTROC RECESSION EXTREMITY;  Surgeon: Evelina Bucy, DPM;  Location: [facility];  Service: Podiatry;  Laterality: Left;  . KNEE ARTHROSCOPY WITH MEDIAL MENISECTOMY Left 12/31/2014   Procedure: LEFT KNEE ARTHROSCOPY, PARTIAL MEDIAL AND LATERAL  MENISECTOMIES WITH [place];  Surgeon: Susa Day, MD;  Location: [facility];  Service: Orthopedics;  Laterality: Left;  . LEFT AND RIGHT HEART CATHETERIZATION WITH CORONARY ANGIOGRAM N/A 08/25/2011   Procedure: LEFT AND RIGHT HEART CATHETERIZATION WITH CORONARY ANGIOGRAM;  Surgeon: Peter M Martinique, MD;  Location: [facility] C[facility] LAB;  Service: Cardiovascular;  Laterality: N/A;   Normal coronary arteries and LVF, ef 55-60%  . POSTERIOR FUSION CERVICAL SPINE  07-05-2010   C1 --  C4  . POSTERIOR LUMBAR FUSION  05-18-2014   L4 -- S1  . ROBOT ASSISTED LAPAROSCOPIC RADICAL PROSTATECTOMY  02/07/2012   Procedure: ROBOTIC ASSISTED LAPAROSCOPIC RADICAL PROSTATECTOMY LEVEL 1;  Surgeon: Molli Hazard, MD;  Location: [facility];  Service: Urology;  Laterality: N/A;     . TENDON TRANSFER Left 06/27/2017   Procedure: TENDON TRANSFER;  Surgeon: Evelina Bucy, DPM;  Location: [facility];  Service: Podiatry;  Laterality: Left;  . TOTAL KNEE ARTHROPLASTY Left 08/05/2015   Procedure: LEFT TOTAL KNEE ARTHROPLASTY;  Surgeon: Susa Day, MD;  Location: [facility];  Service: Orthopedics;  Laterality: Left;  . TRANSTHORACIC ECHOCARDIOGRAM  08-03-2011   mild LVH, ef 55-60%/  trivial TR and PR  . UVULOPALATOPHARYNGOPLASTY (UPPP)/TONSILLECTOMY/SEPTOPLASTY  1990's   and Adenoidectomy  . wisdom teeth extracted   1982    FHx:    Reviewed / unchanged  SHx:    Reviewed / unchanged    Systems Review:  Constitutional: Denies fever, chills, wt changes, headaches, insomnia, fatigue, night sweats, change in appetite. Eyes: Denies redness, blurred vision, diplopia, discharge, itchy, watery eyes.  ENT: Denies discharge, congestion, post nasal drip, epistaxis, sore throat, earache, hearing loss, dental pain, tinnitus, vertigo, sinus pain, snoring.  CV: Denies chest pain, palpitations, irregular heartbeat, syncope, dyspnea, diaphoresis, orthopnea, PND, claudication or edema. Respiratory: denies cough, dyspnea, DOE, pleurisy, hoarseness, laryngitis, wheezing.  Gastrointestinal: Denies dysphagia, odynophagia, heartburn, reflux, water brash, abdominal pain or cramps, nausea, vomiting, bloating, diarrhea, constipation, hematemesis, melena, hematochezia  or hemorrhoids. Genitourinary: Denies dysuria, frequency, urgency, nocturia, hesitancy, discharge, hematuria or flank pain. Musculoskeletal: Denies arthralgias, myalgias, stiffness, jt. swelling, pain, limping or strain/sprain.  Skin: Denies pruritus, rash, hives, warts, acne, eczema or change in skin lesion(s). Neuro: No weakness, tremor, incoordination, spasms, paresthesia or pain. Psychiatric: Denies confusion, memory loss or sensory loss. Endo: Denies change in weight, skin or hair change.  Heme/Lymph: No excessive bleeding, bruising or enlarged lymph nodes.  Physical Exam  BP 126/80   Pulse 80   Temp (!) 97 F (36.1 C)   Resp 18   Ht 6' 1.5" (1.867 m)   Wt (!) 317 lb (143.8 kg)   BMI 41.26 kg/m   Appears  well nourished, well groomed  and in no distress.  Eyes: PERRLA, EOMs, conjunctiva no swelling or erythema. Sinuses: No frontal/maxillary tenderness ENT/Mouth: EAC's clear, TM's nl w/o erythema, bulging. Nares clear w/o erythema, swelling, exudates. Oropharynx clear without erythema or exudates. Oral hygiene is good. Tongue normal, non obstructing. Hearing intact.  Neck: Supple. Thyroid not palpable. Car 2+/2+  without bruits, nodes or JVD. Chest: Respirations nl with BS clear & equal w/o rales, rhonchi, wheezing or stridor.  Cor: Heart sounds normal w/ regular rate and rhythm without sig. murmurs, gallops, clicks or rubs. Peripheral pulses normal and equal  without edema.  Abdomen: Soft & bowel sounds normal. Non-tender w/o guarding, rebound, hernias, masses or organomegaly.  Lymphatics: Unremarkable.  Musculoskeletal: Full ROM all peripheral extremities, joint stability, 5/5 strength and normal gait.  Skin: Warm, dry without exposed rashes, lesions or ecchymosis apparent.  Neuro: Cranial nerves intact, reflexes equal bilaterally. Sensory-motor testing grossly intact. Tendon reflexes grossly intact.  Pysch: Alert & oriented x 3.  Insight and judgement nl & appropriate. No ideations.  Assessment and Plan:  1. Essential hypertension  - Continue medication, monitor blood pressure at home.  - Continue DASH diet.  Reminder to go to the ER if any CP,  SOB, nausea, dizziness, severe HA, changes vision/speech.  - CBC with Differential/Platelet - COMPL - Continue diet, exercise  - Lifestyle modifications.  - Monitor appropriate labs. - Continue supplementation.   METABOLIC PANEL WITH GFR - Magnesium - TSH  2. Hyperlipidemia associated with type 2 diabetes mellitus ([place])  - Continue diet/meds, exercise,& lifestyle modifications.  - Continue monitor periodic cholesterol/liver & renal functions   - Lipid panel - TSH  3. Type 2 diabetes mellitus with stage 2 chronic kidney disease, without long-term current use of insulin (HCC)  - Hemoglobin A1c - Insulin, random  4. Vitamin D deficiency  - VITAMIN D 25 Hydroxy  5. Idiopathic gout  - Uric acid  6. Medication management  - CBC with Differential/Platelet - COMPLETE METABOLIC PANEL WITH GFR - Magnesium - Lipid panel - TSH - Hemoglobin A1c - Insulin, random - VITAMIN D 25 Hydroxy - Uric acid        Discussed  regular exercise,  BP monitoring, weight control to achieve/maintain BMI less than 25 and discussed med and SE's. Recommended labs to assess and monitor clinical status with further disposition pending results of labs.  I discussed the assessment and treatment plan with the patient. The patient was provided an opportunity to ask questions and all were answered. The patient agreed with the plan and demonstrated an understanding of the instructions.  I provided over 30 minutes of exam, counseling, chart review and  complex critical decision making.   Kirtland Bouchard, MD

## 2019-04-03 NOTE — Patient Instructions (Signed)

## 2019-04-04 ENCOUNTER — Other Ambulatory Visit: Payer: Self-pay | Admitting: Internal Medicine

## 2019-04-04 LAB — COMPLETE METABOLIC PANEL WITH GFR
AG Ratio: 1.9 (calc) (ref 1.0–2.5)
ALT: 21 U/L (ref 9–46)
AST: 26 U/L (ref 10–35)
Albumin: 4.4 g/dL (ref 3.6–5.1)
Alkaline phosphatase (APISO): 62 U/L (ref 35–144)
BUN: 13 mg/dL (ref 7–25)
CO2: 24 mmol/L (ref 20–32)
Calcium: 9.4 mg/dL (ref 8.6–10.3)
Chloride: 103 mmol/L (ref 98–110)
Creat: 1 mg/dL (ref 0.70–1.25)
GFR, Est African American: 90 mL/min/{1.73_m2} (ref >=60)
GFR, Est Non African American: 78 mL/min/{1.73_m2} (ref >=60)
Globulin: 2.3 g/dL (calc) (ref 1.9–3.7)
Glucose, Bld: 136 mg/dL — ABNORMAL HIGH (ref 65–99)
Potassium: 4.6 mmol/L (ref 3.5–5.3)
Sodium: 138 mmol/L (ref 135–146)
Total Bilirubin: 0.4 mg/dL (ref 0.2–1.2)
Total Protein: 6.7 g/dL (ref 6.1–8.1)

## 2019-04-04 LAB — CBC WITH DIFFERENTIAL/PLATELET
Absolute Monocytes: 693 cells/uL (ref 200–950)
Basophils Absolute: 91 cells/uL (ref 0–200)
Basophils Relative: 1.3 %
Eosinophils Absolute: 147 cells/uL (ref 15–500)
Eosinophils Relative: 2.1 %
HCT: 41 % (ref 38.5–50.0)
Hemoglobin: 13.8 g/dL (ref 13.2–17.1)
Lymphs Abs: 1540 cells/uL (ref 850–3900)
MCH: 29.4 pg (ref 27.0–33.0)
MCHC: 33.7 g/dL (ref 32.0–36.0)
MCV: 87.2 fL (ref 80.0–100.0)
MPV: 10.1 fL (ref 7.5–12.5)
Monocytes Relative: 9.9 %
Neutro Abs: 4529 cells/uL (ref 1500–7800)
Neutrophils Relative %: 64.7 %
Platelets: 296 10*3/uL (ref 140–400)
RBC: 4.7 10*6/uL (ref 4.20–5.80)
RDW: 14.5 % (ref 11.0–15.0)
Total Lymphocyte: 22 %
WBC: 7 10*3/uL (ref 3.8–10.8)

## 2019-04-04 LAB — URIC ACID: Uric Acid, Serum: 6 mg/dL (ref 4.0–8.0)

## 2019-04-04 LAB — HEMOGLOBIN A1C
Hgb A1c MFr Bld: 8.5 % of total Hgb — ABNORMAL HIGH (ref <5.7)
Mean Plasma Glucose: 197 (calc)
eAG (mmol/L): 10.9 (calc)

## 2019-04-04 LAB — TSH: TSH: 2.29 mIU/L (ref 0.40–4.50)

## 2019-04-04 LAB — LIPID PANEL
Cholesterol: 99 mg/dL (ref <200)
HDL: 33 mg/dL — ABNORMAL LOW (ref >=40)
LDL Cholesterol (Calc): 35 mg/dL (calc)
Non-HDL Cholesterol (Calc): 66 mg/dL (calc) (ref <130)
Total CHOL/HDL Ratio: 3 (calc) (ref <5.0)
Triglycerides: 260 mg/dL — ABNORMAL HIGH (ref <150)

## 2019-04-04 LAB — VITAMIN D 25 HYDROXY (VIT D DEFICIENCY, FRACTURES): Vit D, 25-Hydroxy: 89 ng/mL (ref 30–100)

## 2019-04-04 LAB — MAGNESIUM: Magnesium: 1.8 mg/dL (ref 1.5–2.5)

## 2019-04-04 LAB — INSULIN, RANDOM: Insulin: 24.2 u[IU]/mL — ABNORMAL HIGH

## 2019-04-04 MED ORDER — FENOFIBRATE 145 MG PO TABS: ORAL_TABLET | ORAL | 0 refills | Status: DC

## 2019-04-06 ENCOUNTER — Encounter: Payer: Self-pay | Admitting: Internal Medicine

## 2019-05-15 ENCOUNTER — Telehealth: Payer: Self-pay | Admitting: *Deleted

## 2019-05-15 NOTE — Telephone Encounter (Signed)
Patient called and states he has a rash, that he thinks is related to hs Fenofibrate.  Patient left off the Fenofibrate last night and the rash is gone, but he is itching. Per Dr Melford Aase, the patient was advised to take Zyrtec 10 mg in the morning and Benadryl 25 mg 2 tablets at bedtime.  He also advised to restart the Fenofibrate. Patient is aware.

## 2019-05-22 ENCOUNTER — Other Ambulatory Visit: Payer: Self-pay | Admitting: Adult Health

## 2019-05-29 ENCOUNTER — Ambulatory Visit: Payer: Medicare Other | Admitting: Podiatry

## 2019-05-29 ENCOUNTER — Other Ambulatory Visit: Payer: Self-pay

## 2019-05-29 ENCOUNTER — Encounter: Payer: Self-pay | Admitting: Podiatry

## 2019-05-29 DIAGNOSIS — B351 Tinea unguium: Secondary | ICD-10-CM

## 2019-05-29 DIAGNOSIS — M79676 Pain in unspecified toe(s): Secondary | ICD-10-CM | POA: Diagnosis not present

## 2019-05-29 DIAGNOSIS — Q828 Other specified congenital malformations of skin: Secondary | ICD-10-CM | POA: Diagnosis not present

## 2019-05-29 DIAGNOSIS — E1142 Type 2 diabetes mellitus with diabetic polyneuropathy: Secondary | ICD-10-CM | POA: Diagnosis not present

## 2019-05-29 DIAGNOSIS — M7751 Other enthesopathy of right foot: Secondary | ICD-10-CM | POA: Diagnosis not present

## 2019-05-29 NOTE — Progress Notes (Signed)
He presents today chief complaint of painful elongated toenails he is also having pain to the medial malleoli region of the right foot with radiating pain from the anterior edge of the medial malleolus.  States that it radiates up to the front of his leg just medial to his tibial crest.  He is also complaining of painful calluses.  Objective: Vital signs are stable alert oriented x3 pulses are palpable.  Hammertoes with distal clavi are noted.  Multiple reactive hyperkeratosis to the plantar aspect of the bilateral foot toenails are long thick yellow dystrophic-like mycotic he has pain palpation medial malleolus.  Assessment: Neuritis saphenous nerve medial malleolus right foot pain in limb secondary to onychomycosis and pain secondary to hyperkeratotic lesions.  Plan: Discussed etiology pathology conservative versus surgical therapies injected 2 mg of dexamethasone to the medial malleolus to the point of maximal tenderness.  He tolerated this procedure well.  Debrided toenails 1 through 5 bilaterally.  Also debrided all reactive hyperkeratotic tissue.  Follow-up with him in 3 months sooner if needed.

## 2019-06-01 ENCOUNTER — Other Ambulatory Visit: Payer: Self-pay | Admitting: Physician Assistant

## 2019-06-01 ENCOUNTER — Other Ambulatory Visit: Payer: Self-pay | Admitting: Internal Medicine

## 2019-06-05 ENCOUNTER — Telehealth: Payer: Self-pay | Admitting: *Deleted

## 2019-06-05 ENCOUNTER — Other Ambulatory Visit: Payer: Self-pay | Admitting: Internal Medicine

## 2019-06-05 MED ORDER — DEXAMETHASONE 4 MG PO TABS
ORAL_TABLET | ORAL | 0 refills | Status: DC
Start: 2019-06-05 — End: 2019-08-07

## 2019-06-05 NOTE — Telephone Encounter (Signed)
Patient called and complained of rash on arms and legs. He stopped Fenofibrate and is taking Benadryl 50 mg at bedtime and zyrtec in the morning. Dr Melford Aase has sent an RX for Prednisone for the patient for his rash.

## 2019-06-05 NOTE — Telephone Encounter (Signed)
Pharmacist called and asked about 3 of Dexamethasone tablet the patient will need. Per Dr Melford Aase, the end of the sig should say 1 tablet every other day for 3 weeks and #30 is correct. Pharmacist is aware.

## 2019-06-25 DIAGNOSIS — M4326 Fusion of spine, lumbar region: Secondary | ICD-10-CM | POA: Diagnosis not present

## 2019-06-25 DIAGNOSIS — M545 Low back pain: Secondary | ICD-10-CM | POA: Diagnosis not present

## 2019-06-25 DIAGNOSIS — M7918 Myalgia, other site: Secondary | ICD-10-CM | POA: Diagnosis not present

## 2019-08-06 NOTE — Progress Notes (Signed)
Patient ID: Jason Wyatt, male   DOB: Jan 17, 1955, 64 y.o.   MRN: [id number]  3 month follow up and Medicare wellness  Assessment and Plan  Diagnoses and all orders for this visit:  Encounter for Medicare annual wellness exam  Essential hypertension Continue medication Monitor blood pressure at home more closely; call if consistently over 130/80 Continue DASH diet.   Reminder to go to the ER if any CP, SOB, nausea, dizziness, severe HA, changes vision/speech, left arm numbness and tingling and jaw pain.  Atherosclerotic peripheral vascular disease (HCC) Control blood pressure, cholesterol, glucose, increase exercise.   Obesity hypoventilation syndrome Continue CPAP, weight loss encouraged  Type 2 diabetes mellitus with CKD [facility]) Education: Reviewed 'ABCs' of diabetes management (respective goals in parentheses):  A1C (<7), blood pressure (<130/80), and cholesterol (LDL <70) Eye Exam yearly and Dental Exam every 6 months. Dietary recommendations Physical Activity recommendations Persistently above goal with max dose metformin; discussed several medications - patient would benefit from [facility] for glucose and weight, he is interested in trulicity after discussion of risks and benefits Given samples 0.75 mg x 2, inject sub Q weekly, given coupon and sent in 4 additional weeks to pharmacy. Follow up in 4-6 weeks and plan to increase to 1.5 mg if tolerating. Follow up sooner if any med concerns.  - check lipid panel  CKD II associated with T2DM (HCC) Increase fluids, avoid NSAIDS, control sugars and blood pressure, will monitor - CMP/GFR  Hyperlipidemia associated with T2DM (HCC) Continue medications; at LDL goal <70 Continue low cholesterol diet and exercise.  Check lipid panel.   DM type 2 with diabetic peripheral neuropathy (HCC) Check feet at home daily, call with changes/concerns Followed closely by podiatry q75mEmphasized need for improved glucose control -   Primary  osteoarthritis of left knee Followed by ortho  CKD (chronic kidney disease), stage II Increase fluids, avoid NSAIDS, monitor sugars, will monitor  RLS Continue meds, increase walking, monitor iron/B12  Morbid obesity (BMI 41+) Long discussion about weight loss, diet, and exercise Recommended diet heavy in fruits and veggies and low in animal meats, cheeses, and dairy products, appropriate calorie intake Discussed appropriate weight for height  Discussed restricting portions of starches, increase high fiber, information given Will start trulicity for glucose and weight benefits Follow up at next visit  Medication management CBC, CMP/GFR, magnesium  Idiopathic chronic gout without tophus, unspecified site Continue allopurinol Diet discussed Check uric acid as needed  Hx of prostate cancer S/p robotic excision, followed by Dr. HLouis Meckel Flomax for residual LUTS  During the course of the visit the patient was educated and counseled about appropriate screening and preventive services including:    Pneumococcal vaccine   Influenza vaccine  Td vaccine  Screening electrocardiogram  Screening mammography  Bone densitometry screening  Colorectal cancer screening  Diabetes screening  Glaucoma screening  Nutrition counseling   Advanced directives: given information/requested  Future Appointments  Date Time [place] 09/02/2019  8:15 AM HTyson DenseT, DPM [facility]  11/27/2019 10:30 AM MGarnet Sierras NP [facility] None    HPI 64y.o. male  presents for 3 month follow up with hypertension, hyperlipidemia, diabetes and vitamin D deficiency and medicare wellness visit.   He is on CPAP for OSA/obesity hypoventilation syndrome and endorses 100% compliance and restorative sleep.   He has imbalance attributed to advanced peripheral neuropathy, no falls this year, does ambulate without cane reasonably at this time.  He follows with Dr. MIleene Rubens  for back pain. Follows with podiatry Dr. [name] for diabetic neuropathy.  He is on sinemet BID for RLS. Also takes gabapentin 600 mg TID PRN.   He is taking flomax in the AM per urology for sense of incomplete bladder emptying; hx of prostate cancer and robotic excision in 2014 and followed by Dr. [name] at [facility] until pandemic.   he has a diagnosis of GERD which is currently managed by pantoprazole 40 mg he reports symptoms is currently well controlled, and denies breakthrough reflux, burning in chest, hoarseness or cough.    BMI is Body mass index is 41 kg/m., he has not been working on diet and exercise due back/joint pain. Admits he eats potatoes daily, knows he needs to do better with diet.  Wt Readings from Last 3 Encounters:  08/07/19 (!) 315 lb (142.9 kg)  04/03/19 (!) 317 lb (143.8 kg)  12/10/18 (!) 314 lb (142.4 kg)   His blood pressure has been controlled at home, today their BP is BP: (!) 138/78  He does workout, walks daily with dog. He denies chest pain, shortness of breath, dizziness.   He is on cholesterol medication, on rosuvastatin 5 mg every other day denies myalgias. His cholesterol is not at goal. The cholesterol last visit was:   Lab Results  Component Value Date   CHOL 99 04/03/2019   HDL 33 (L) 04/03/2019   LDLCALC 35 04/03/2019   TRIG 260 (H) 04/03/2019   CHOLHDL 3.0 04/03/2019    He has been working on diet  for Type 2 diabetes on metformin 2000 mg daily, and denies foot ulcerations, increased appetite, nausea, paresthesia of the feet, polydipsia, polyuria, visual disturbances, vomiting and weight loss.  He has been checking fasting glucose, log demonstrates 180-210s consistently over the last month Last A1C in the office was:  Lab Results  Component Value Date   HGBA1C 8.5 (H) 04/03/2019   He has CKD II associated with T2DM monitored at this office. Last GFR:  Lab Results  Component Value Date   GFRNONAA 78 04/03/2019   Patient is on  Vitamin D supplement, taking 50000 IU every other day    Lab Results  Component Value Date   VD25OH 89 04/03/2019     Patient is on allopurinol for gout and does not report a recent flare.  Lab Results  Component Value Date   LABURIC 6.0 04/03/2019      Current Outpatient Medications  Medication Instructions  . Accu-Chek Softclix Lancets lancets USE WITH METER TO CHECK  BLOOD SUGAR ONCE A DAY  . acetaminophen (TYLENOL) 500 mg, Oral, Every 6 hours PRN  . acyclovir (ZOVIRAX) 400 MG tablet TAKE 1 TABLET BY MOUTH  DAILY AS NEEDED FOR COLD  SORES  . allopurinol (ZYLOPRIM) 300 MG tablet Take 1 tablet Daily to Prevent Gout  . aspirin 81 mg, Oral, Daily  . carbidopa-levodopa (SINEMET IR) 25-250 MG tablet Take 1 tablet 2 x /day for Restless Legs  . dextromethorphan-guaiFENesin (MUCINEX DM) 30-600 MG 12hr tablet 1 tablet, Oral, 2 times daily PRN  . Docusate Calcium (STOOL SOFTENER PO) 250 mg, Oral, Daily  . DULoxetine (CYMBALTA) 60 MG capsule TAKE 1 CAPSULE BY MOUTH AT  BEDTIME  . enalapril (VASOTEC) 20 MG tablet Take 1 tablet 2 x /day for BP  . fexofenadine (ALLEGRA) 180 mg, Oral, Daily PRN  . gabapentin (NEURONTIN) 600 MG tablet TAKE 1/2 TO 1 TABLET BY  MOUTH 2 TO 3 TIMES DAILY AS NEEDED FOR PAIN  .  Magnesium 1,500 mg, Oral, Every evening  . meloxicam (MOBIC) 15 MG tablet Take 1/2- 1 tablet Daily as needed for pain with Food  . metFORMIN (GLUCOPHAGE-XR) 500 MG 24 hr tablet Take 2 tablets 2 x /day with Meals for Diabetes  . Multiple Vitamin (MULTIVITAMIN) tablet 1 tablet, Oral, Daily  . ONETOUCH ULTRA test strip USE TO CHECK BLOOD SUGAR  DAILY  . pantoprazole (PROTONIX) 40 MG tablet Take 1 tablet Daily for Heartburn & Indigestion  . rosuvastatin (CRESTOR) 5 MG tablet Take 1 tablet Daily for Cholesterol  . sennosides-docusate sodium (SENOKOT-S) 8.6-50 MG tablet 1 tablet, Oral, Daily  . tamsulosin (FLOMAX) 0.4 MG CAPS capsule TAKE 1 CAPSULE BY MOUTH  DAILY AFTER SUPPER  . Trulicity 8.88 mg,  Subcutaneous, Weekly, For diabetes and weight.  . Vitamin D (Ergocalciferol) (DRISDOL) 50,000 Units, Oral, As directed, Tuesday , Wednesday, Thursday, Sunday     Medical History:  Past Medical History:  Diagnosis Date  . Acute meniscal tear of left knee   . Allergic rhinitis   . Arthritis     back  . Borderline glaucoma   . Chronic back pain    stenosis  . CKD (chronic kidney disease), stage II   . Complication of anesthesia cervical fusion surgery 06/2010   "woke up next day w/ventilator on" told due to OSA  . GERD (gastroesophageal reflux disease)    takes Prevacid daily  . Gout    takes Allopurinol daily and Colchicine if needed  . History of adenomatous polyp of colon    tubular adenoma's  . History of Barrett's esophagus   . History of hiatal hernia   . Hyperlipidemia   . Hypertension   . Incomplete right bundle branch block (RBBB)   . OA (osteoarthritis)    knee  . OSA on CPAP   . Peripheral neuropathy   . Prostate cancer [facility]) urologist-  dr Alinda Money   dx 2013  s/p  radical non-nerve sparing prostatectomy 2014--  Stage pT2cNx,  PSA 3.48,  Gleason 3+3,  vol 55cc/  current PSA  0.01 (Aug 2016)  . Rupture of Achilles tendon   . Thyroid nodule    left side  . Type II diabetes mellitus ([place])   . Urinary frequency    takes Flomax daily  . Urine incontinence   . Wears glasses    Allergies Allergies  Allergen Reactions  . Fenofibrate Itching and Rash  . Flaxseed [Linseed Oil] Rash  . Lyrica [Pregabalin] Itching and Rash  . Niaspan [Niacin Er] Rash   Preventative Medicine Immunization History  Administered Date(s) Administered  . Influenza Inj Mdck Quad With Preservative 11/22/2016, 12/04/2017  . Influenza Split 10/17/2013, 10/29/2014  . Influenza, High Dose Seasonal PF 11/26/2018  . Influenza, Seasonal, Injecte, Preservative Fre 11/29/2015  . Influenza-Unspecified 11/04/2012  . PFIZER SARS-COV-2 Vaccination 03/10/2019, 03/31/2019  . PPD Test 01/16/2006,  04/07/2013  . Pneumococcal Conjugate-13 07/29/2018  . Pneumococcal Polysaccharide-23 04/07/2013, 11/22/2016  . Pneumococcal-Unspecified 09/17/2010  . Td 01/17/2011  . Zoster 01/16/2005   Colonoscopy 2016 - Dr. Cristina Gong - due 09/2019, per patient met early 2021 and was advised ok to postponed to next year EGD 2013 Echo 02/2018, EF 55-60%  Tetanus: 2013 Influenza: 11/2018 Pneumonia: 2015, 2018 Prevnar 13: 07/2018 Shingles: 2007 Covid 19: 2/2, 2021, pfizer  Vision: Dr. Einar Gip, 01/03/2019, report verified and abstracted Dental: Dr. Gloriann Loan, 2019, goes q71m Patient Care Team: MUnk Pinto MD as PCP - General (Internal Medicine) BLorretta Harp MD as PCP -  Cardiology (Cardiology) Garrel Ridgel, [place] as Consulting Physician (Podiatry) Webb Laws, [place] as Referring Physician (Optometry) Martinique, Peter M, MD as Consulting Physician (Cardiology) Ronald Lobo, MD as Consulting Physician (Gastroenterology) Susa Day, MD as Consulting Physician (Orthopedic Surgery) Ardis Hughs, MD as Attending Physician (Urology)   SURGICAL HISTORY He  has a past surgical history that includes Posterior fusion cervical spine (07-05-2010); Esophagogastroduodenoscopy (09/26/2011); Robot assisted laparoscopic radical prostatectomy (02/07/2012); wisdom teeth extracted  (1982); left and right heart catheterization with coronary angiogram (N/A, 08/25/2011); Anal fissure repair (06-05-2002); Cardiovascular stress test (01-01-2008); transthoracic echocardiogram (08-03-2011); Posterior lumbar fusion (05-18-2014); Uvulopalatopharyngoplasty (uppp)/tonsillectomy/septoplasty (1990's); Knee arthroscopy with medial menisectomy (Left, 12/31/2014); Cardiac catheterization; Back surgery; Total knee arthroplasty (Left, 08/05/2015); Gastroc recession extremity (Left, 06/27/2017); Achilles tendon surgery (Left, 06/27/2017); and Tendon transfer (Left, 06/27/2017). FAMILY HISTORY His family history includes Cancer in  his father; Dementia in his mother; Hyperlipidemia in his sister; Hypertension in his mother and sister; Osteoarthritis in his mother; Osteoporosis in his mother. SOCIAL HISTORY He  reports that he has never smoked. He has never used smokeless tobacco. He reports that he does not drink alcohol and does not use drugs.  MEDICARE WELLNESS OBJECTIVES: Physical activity: Current Exercise Habits: The patient does not participate in regular exercise at present, Exercise limited by: orthopedic condition(s) Cardiac risk factors: Cardiac Risk Factors include: advanced age (>20mn, >>5women);dyslipidemia;hypertension;male gender;obesity (BMI >30kg/m2);sedentary lifestyle;diabetes mellitus Depression/mood screen:   [facility] 08/07/2019  Decreased Interest 0  Down, Depressed, Hopeless 0  PHQ - 2 Score 0  Altered sleeping -  Tired, decreased energy -  Change in appetite -  Feeling bad or failure about yourself  -  Trouble concentrating -  Moving slowly or fidgety/restless -  Suicidal thoughts -  PHQ-9 Score -  Difficult doing work/chores -  Some recent data might be hidden    ADLs:  In your present state of health, do you have any difficulty performing the following activities: 08/07/2019 04/06/2019  Hearing? N N  Vision? N N  Difficulty concentrating or making decisions? N N  Walking or climbing stairs? Y N  Comment manages around home, uses buggy out in public -  Dressing or bathing? N N  Doing errands, shopping? N N  Some recent data might be hidden     Cognitive Testing  Alert? Yes  Normal Appearance?Yes  Oriented to person? Yes  Place? Yes   Time? Yes  Recall of three objects?  Yes  Can perform simple calculations? Yes  Displays appropriate judgment?Yes  Can read the correct time from a watch face?Yes  EOL planning: Does Patient Have a Medical Advance Directive?: No Would patient like information on creating a medical advance directive?: No - Patient  declined   Review of Systems:  Review of Systems  Constitutional: Negative for chills, diaphoresis, fever, malaise/fatigue and weight loss.  HENT: Negative for congestion, ear pain, hearing loss, sore throat and tinnitus.   Eyes: Negative.  Negative for blurred vision and double vision.  Respiratory: Negative for cough, sputum production, shortness of breath and wheezing.   Cardiovascular: Negative for chest pain, palpitations, orthopnea, claudication, leg swelling and PND.  Gastrointestinal: Negative for abdominal pain, blood in stool, constipation, diarrhea, heartburn, melena, nausea and vomiting.  Genitourinary: Negative.   Musculoskeletal: Positive for back pain. Negative for falls, joint pain and myalgias.  Skin: Negative.  Negative for rash.  Neurological: Positive for tingling (bil fingers) and sensory change (chronic numbness of bil feet). Negative for dizziness, loss  of consciousness, weakness and headaches.  Endo/Heme/Allergies: Negative for polydipsia.  Psychiatric/Behavioral: Negative.  Negative for depression, memory loss, substance abuse and suicidal ideas. The patient is not nervous/anxious and does not have insomnia.   All other systems reviewed and are negative.   Physical Exam: BP (!) 138/78   Pulse 92   Temp (!) 97.3 F (36.3 C)   Wt (!) 315 lb (142.9 kg)   SpO2 96%   BMI 41.00 kg/m  Wt Readings from Last 3 Encounters:  08/07/19 (!) 315 lb (142.9 kg)  04/03/19 (!) 317 lb (143.8 kg)  12/10/18 (!) 314 lb (142.4 kg)   General Appearance: Well nourished well developed, non-toxic appearing, in no apparent distress. Eyes: PERRLA, EOMs, conjunctiva no swelling or erythema ENT/Mouth: Ear canals clear with no erythema, swelling, or discharge.  TMs normal bilaterally, oropharynx clear, moist, with no exudate.   Neck: Supple, thyroid normal, no JVD, no cervical adenopathy.  Respiratory: Respiratory effort normal, breath sounds clear A&P, no wheeze, rhonchi or rales  noted.  No retractions, no accessory muscle usage Cardio: RRR with no MRGs. No edema noted Abdomen: Soft, + BS, morbidly obese abdomen.  Non tender, no guarding, rebound, hernias, masses. Musculoskeletal: Full ROM, 5/5 strength, antalgic gait Skin: Warm, dry without rashes, lesions, ecchymosis. Calluses to bilateral feet without ulcers or wounds. Neuro: Awake and oriented X 3, Cranial nerves intact. No cerebellar symptoms. He has 0/10 to monofilament in bilateral feet.  Psych: normal affect, Insight and Judgment appropriate.   Medicare Attestation I have personally reviewed: The patient's medical and social history Their use of alcohol, tobacco or illicit drugs Their current medications and supplements The patient's functional ability including ADLs,fall risks, home safety risks, cognitive, and hearing and visual impairment Diet and physical activities Evidence for depression or mood disorders  The patient's weight, height, BMI, and visual acuity have been recorded in the chart.  I have made referrals, counseling, and provided education to the patient based on review of the above and I have provided the patient with a written personalized care plan for preventive services.     Izora Ribas, NP 12:37 PM [facility]

## 2019-08-07 ENCOUNTER — Ambulatory Visit (INDEPENDENT_AMBULATORY_CARE_PROVIDER_SITE_OTHER): Payer: Medicare Other | Admitting: Adult Health

## 2019-08-07 ENCOUNTER — Other Ambulatory Visit: Payer: Self-pay

## 2019-08-07 ENCOUNTER — Encounter: Payer: Self-pay | Admitting: Adult Health

## 2019-08-07 VITALS — BP 138/78 | HR 92 | Temp 97.3°F | Wt 315.0 lb

## 2019-08-07 DIAGNOSIS — N182 Chronic kidney disease, stage 2 (mild): Secondary | ICD-10-CM

## 2019-08-07 DIAGNOSIS — E1122 Type 2 diabetes mellitus with diabetic chronic kidney disease: Secondary | ICD-10-CM

## 2019-08-07 DIAGNOSIS — I70209 Unspecified atherosclerosis of native arteries of extremities, unspecified extremity: Secondary | ICD-10-CM

## 2019-08-07 DIAGNOSIS — E662 Morbid (severe) obesity with alveolar hypoventilation: Secondary | ICD-10-CM | POA: Diagnosis not present

## 2019-08-07 DIAGNOSIS — E1129 Type 2 diabetes mellitus with other diabetic kidney complication: Secondary | ICD-10-CM | POA: Diagnosis not present

## 2019-08-07 DIAGNOSIS — G2581 Restless legs syndrome: Secondary | ICD-10-CM

## 2019-08-07 DIAGNOSIS — E1142 Type 2 diabetes mellitus with diabetic polyneuropathy: Secondary | ICD-10-CM

## 2019-08-07 DIAGNOSIS — R2681 Unsteadiness on feet: Secondary | ICD-10-CM

## 2019-08-07 DIAGNOSIS — E785 Hyperlipidemia, unspecified: Secondary | ICD-10-CM | POA: Diagnosis not present

## 2019-08-07 DIAGNOSIS — M1A00X0 Idiopathic chronic gout, unspecified site, without tophus (tophi): Secondary | ICD-10-CM

## 2019-08-07 DIAGNOSIS — Z0000 Encounter for general adult medical examination without abnormal findings: Secondary | ICD-10-CM

## 2019-08-07 DIAGNOSIS — I10 Essential (primary) hypertension: Secondary | ICD-10-CM | POA: Diagnosis not present

## 2019-08-07 DIAGNOSIS — E1169 Type 2 diabetes mellitus with other specified complication: Secondary | ICD-10-CM | POA: Diagnosis not present

## 2019-08-07 DIAGNOSIS — R6889 Other general symptoms and signs: Secondary | ICD-10-CM

## 2019-08-07 DIAGNOSIS — Z0001 Encounter for general adult medical examination with abnormal findings: Secondary | ICD-10-CM

## 2019-08-07 DIAGNOSIS — Z8546 Personal history of malignant neoplasm of prostate: Secondary | ICD-10-CM | POA: Insufficient documentation

## 2019-08-07 DIAGNOSIS — Z79899 Other long term (current) drug therapy: Secondary | ICD-10-CM

## 2019-08-07 MED ORDER — TRULICITY 0.75 MG/0.5ML ~~LOC~~ SOAJ
0.7500 mg | SUBCUTANEOUS | 0 refills | Status: DC
Start: 2019-08-07 — End: 2019-09-18

## 2019-08-07 MED ORDER — MELOXICAM 15 MG PO TABS: ORAL_TABLET | ORAL | 1 refills | Status: DC

## 2019-08-07 NOTE — Patient Instructions (Addendum)
Jason Wyatt , Thank you for taking time to come for your Medicare Wellness Visit. I appreciate your ongoing commitment to your health goals. Please review the following plan we discussed and let me know if I can assist you in the future.   These are the goals we discussed: Goals    . Fasting Blood Glucose<130    . Weight (lb) < 300 lb (136.1 kg)       This is a list of the screening recommended for you and due dates:  Health Maintenance  Topic Date Due  . Complete foot exam   07/29/2019  . Flu Shot  08/17/2019  . Colon Cancer Screening  09/17/2019  . Hemoglobin A1C  10/04/2019  . Eye exam for diabetics  01/03/2020  . Tetanus Vaccine  01/16/2021  . Pneumonia vaccines (2 of 2 - PPSV23) 11/22/2021  . COVID-19 Vaccine  Completed  .  Hepatitis C: One time screening is recommended by Center for Disease Control  (CDC) for  adults born from [birth year] through 1967.   Completed   Double check to make sure gabapentin is 600 mg twice a day -      Dulaglutide injection What is this medicine? DULAGLUTIDE (DOO la GLOO tide) is used to improve blood sugar control in adults with type 2 diabetes. This medicine may be used with other oral diabetes medicines. This drug may also reduce the risk of heart attack or stroke if you have type 2 diabetes and risk factors for heart disease. This medicine may be used for other purposes; ask your health care provider or pharmacist if you have questions. COMMON BRAND NAME(S): Trulicity What should I tell my health care provider before I take this medicine? They need to know if you have any of these conditions:  endocrine tumors (MEN 2) or if someone in your family had these tumors  eye disease, vision problems  history of pancreatitis  kidney disease  liver disease  stomach problems  thyroid cancer or if someone in your family had thyroid cancer  an unusual or allergic reaction to dulaglutide, other medicines, foods, dyes, or preservatives  pregnant  or trying to get pregnant  breast-feeding How should I use this medicine? This medicine is for injection under the skin of your upper leg (thigh), stomach area, or upper arm. It is usually given once every week (every 7 days). You will be taught how to prepare and give this medicine. Use exactly as directed. Take your medicine at regular intervals. Do not take it more often than directed. If you use this medicine with insulin, you should inject this medicine and the insulin separately. Do not mix them together. Do not give the injections right next to each other. Change (rotate) injection sites with each injection. It is important that you put your used needles and syringes in a special sharps container. Do not put them in a trash can. If you do not have a sharps container, call your pharmacist or healthcare provider to get one. A special MedGuide will be given to you by the pharmacist with each prescription and refill. Be sure to read this information carefully each time. This drug comes with INSTRUCTIONS FOR USE. Ask your pharmacist for directions on how to use this drug. Read the information carefully. Talk to your pharmacist or health care provider if you have questions. Talk to your pediatrician regarding the use of this medicine in children. Special care may be needed. Overdosage: If you think you have taken  too much of this medicine contact a poison control center or emergency room at once. NOTE: This medicine is only for you. Do not share this medicine with others. What if I miss a dose? If you miss a dose, take it as soon as you can within 3 days after the missed dose. Then take your next dose at your regular weekly time. If it has been longer than 3 days after the missed dose, do not take the missed dose. Take the next dose at your regular time. Do not take double or extra doses. If you have questions about a missed dose, contact your health care provider for advice. What may interact with  this medicine?  other medicines for diabetes Many medications may cause changes in blood sugar, these include:  alcohol containing beverages  antiviral medicines for HIV or AIDS  aspirin and aspirin-like drugs  certain medicines for blood pressure, heart disease, irregular heart beat  chromium  diuretics  male hormones, such as estrogens or progestins, birth control pills  fenofibrate  gemfibrozil  isoniazid  lanreotide  male hormones or anabolic steroids  MAOIs like Carbex, Eldepryl, Marplan, Nardil, and Parnate  medicines for weight loss  medicines for allergies, asthma, cold, or cough  medicines for depression, anxiety, or psychotic disturbances  niacin  nicotine  NSAIDs, medicines for pain and inflammation, like ibuprofen or naproxen  octreotide  pasireotide  pentamidine  phenytoin  probenecid  quinolone antibiotics such as ciprofloxacin, levofloxacin, ofloxacin  some herbal dietary supplements  steroid medicines such as prednisone or cortisone  sulfamethoxazole; trimethoprim  thyroid hormones Some medications can hide the warning symptoms of low blood sugar (hypoglycemia). You may need to monitor your blood sugar more closely if you are taking one of these medications. These include:  beta-blockers, often used for high blood pressure or heart problems (examples include atenolol, metoprolol, propranolol)  clonidine  guanethidine  reserpine This list may not describe all possible interactions. Give your health care provider a list of all the medicines, herbs, non-prescription drugs, or dietary supplements you use. Also tell them if you smoke, drink alcohol, or use illegal drugs. Some items may interact with your medicine. What should I watch for while using this medicine? Visit your doctor or health care professional for regular checks on your progress. Drink plenty of fluids while taking this medicine. Check with your doctor or health  care professional if you get an attack of severe diarrhea, nausea, and vomiting. The loss of too much body fluid can make it dangerous for you to take this medicine. A test called the HbA1C (A1C) will be monitored. This is a simple blood test. It measures your blood sugar control over the last 2 to 3 months. You will receive this test every 3 to 6 months. Learn how to check your blood sugar. Learn the symptoms of low and high blood sugar and how to manage them. Always carry a quick-source of sugar with you in case you have symptoms of low blood sugar. Examples include hard sugar candy or glucose tablets. Make sure others know that you can choke if you eat or drink when you develop serious symptoms of low blood sugar, such as seizures or unconsciousness. They must get medical help at once. Tell your doctor or health care professional if you have high blood sugar. You might need to change the dose of your medicine. If you are sick or exercising more than usual, you might need to change the dose of your medicine. Do  not skip meals. Ask your doctor or health care professional if you should avoid alcohol. Many nonprescription cough and cold products contain sugar or alcohol. These can affect blood sugar. Pens should never be shared. Even if the needle is changed, sharing may result in passing of viruses like hepatitis or HIV. Wear a medical ID bracelet or chain, and carry a card that describes your disease and details of your medicine and dosage times. What side effects may I notice from receiving this medicine? Side effects that you should report to your doctor or health care professional as soon as possible:  allergic reactions like skin rash, itching or hives, swelling of the face, lips, or tongue  breathing problems  changes in vision  diarrhea that continues or is severe  lump or swelling on the neck  severe nausea  signs and symptoms of infection like fever or chills; cough; sore throat; pain  or trouble passing urine  signs and symptoms of low blood sugar such as feeling anxious, confusion, dizziness, increased hunger, unusually weak or tired, sweating, shakiness, cold, irritable, headache, blurred vision, fast heartbeat, loss of consciousness  signs and symptoms of kidney injury like trouble passing urine or change in the amount of urine  trouble swallowing  unusual stomach upset or pain  vomiting Side effects that usually do not require medical attention (report to your doctor or health care professional if they continue or are bothersome):  diarrhea  loss of appetite  nausea  pain, redness, or irritation at site where injected  stomach upset This list may not describe all possible side effects. Call your doctor for medical advice about side effects. You may report side effects to FDA at 1-800-FDA-1088. Where should I keep my medicine? Keep out of the reach of children. Store unopened pens in a refrigerator between 2 and 8 degrees C (36 and 46 degrees F). Do not freeze or use if the medicine has been frozen. Protect from light and excessive heat. Store in the carton until use. Each single-dose pen can be kept at room temperature, not to exceed 30 degrees C (86 degrees F) for a total of 14 days, if needed. Throw away any unused medicine after the expiration date on the label. NOTE: This sheet is a summary. It may not cover all possible information. If you have questions about this medicine, talk to your doctor, pharmacist, or health care provider.  2020 Elsevier/Gold Standard (2018-09-17 09:34:53)     High-Fiber Diet Fiber, also called dietary fiber, is a type of carbohydrate that is found in fruits, vegetables, whole grains, and beans. A high-fiber diet can have many health benefits. Your health care provider may recommend a high-fiber diet to help:  Prevent constipation. Fiber can make your bowel movements more regular.  Lower your cholesterol.  Relieve the  following conditions: ? Swelling of veins in the anus (hemorrhoids). ? Swelling and irritation (inflammation) of specific areas of the digestive tract (uncomplicated diverticulosis). ? A problem of the large intestine (colon) that sometimes causes pain and diarrhea (irritable bowel syndrome, IBS).  Prevent overeating as part of a weight-loss plan.  Prevent heart disease, type 2 diabetes, and certain cancers. What is my plan? The recommended daily fiber intake in grams (g) includes:  38 g for men age 84 or younger.  30 g for men over age 20.  76 g for women age 9 or younger.  21 g for women over age 51. You can get the recommended daily intake of dietary fiber  by:  Eating a variety of fruits, vegetables, grains, and beans.  Taking a fiber supplement, if it is not possible to get enough fiber through your diet. What do I need to know about a high-fiber diet?  It is better to get fiber through food sources rather than from fiber supplements. There is not a lot of research about how effective supplements are.  Always check the fiber content on the nutrition facts label of any prepackaged food. Look for foods that contain 5 g of fiber or more per serving.  Talk with a diet and nutrition specialist (dietitian) if you have questions about specific foods that are recommended or not recommended for your medical condition, especially if those foods are not listed below.  Gradually increase how much fiber you consume. If you increase your intake of dietary fiber too quickly, you may have bloating, cramping, or gas.  Drink plenty of water. Water helps you to digest fiber. What are tips for following this plan?  Eat a wide variety of high-fiber foods.  Make sure that half of the grains that you eat each day are whole grains.  Eat breads and cereals that are made with whole-grain flour instead of refined flour or white flour.  Eat brown rice, bulgur wheat, or millet instead of white  rice.  Start the day with a breakfast that is high in fiber, such as a cereal that contains 5 g of fiber or more per serving.  Use beans in place of meat in soups, salads, and pasta dishes.  Eat high-fiber snacks, such as berries, raw vegetables, nuts, and popcorn.  Choose whole fruits and vegetables instead of processed forms like juice or sauce. What foods can I eat?  Fruits Berries. Pears. Apples. Oranges. Avocado. Prunes and raisins. Dried figs. Vegetables Sweet potatoes. Spinach. Kale. Artichokes. Cabbage. Broccoli. Cauliflower. Green peas. Carrots. Squash. Grains Whole-grain breads. Multigrain cereal. Oats and oatmeal. Brown rice. Barley. Bulgur wheat. [place]. Quinoa. Bran muffins. Popcorn. Rye wafer crackers. Meats and other proteins Navy, kidney, and pinto beans. Soybeans. Split peas. Lentils. Nuts and seeds. Dairy Fiber-fortified yogurt. Beverages Fiber-fortified soy milk. Fiber-fortified orange juice. Other foods Fiber bars. The items listed above may not be a complete list of recommended foods and beverages. Contact a dietitian for more options. What foods are not recommended? Fruits Fruit juice. Cooked, strained fruit. Vegetables Fried potatoes. Canned vegetables. Well-cooked vegetables. Grains White bread. Pasta made with refined flour. White rice. Meats and other proteins Fatty cuts of meat. Fried chicken or fried fish. Dairy Milk. Yogurt. Cream cheese. Sour cream. Fats and oils Butters. Beverages Soft drinks. Other foods Cakes and pastries. The items listed above may not be a complete list of foods and beverages to avoid. Contact a dietitian for more information. Summary  Fiber is a type of carbohydrate. It is found in fruits, vegetables, whole grains, and beans.  There are many health benefits of eating a high-fiber diet, such as preventing constipation, lowering blood cholesterol, helping with weight loss, and reducing your risk of heart disease,  diabetes, and certain cancers.  Gradually increase your intake of fiber. Increasing too fast can result in cramping, bloating, and gas. Drink plenty of water while you increase your fiber.  The best sources of fiber include whole fruits and vegetables, whole grains, nuts, seeds, and beans. This information is not intended to replace advice given to you by your health care provider. Make sure you discuss any questions you have with your health care provider. Document Revised:  11/06/2016 Document Reviewed: 11/06/2016 Elsevier Patient Education  [place].

## 2019-08-08 LAB — CBC WITH DIFFERENTIAL/PLATELET
Absolute Monocytes: 569 cells/uL (ref 200–950)
Basophils Absolute: 58 cells/uL (ref 0–200)
Basophils Relative: 0.8 %
Eosinophils Absolute: 130 cells/uL (ref 15–500)
Eosinophils Relative: 1.8 %
HCT: 41.4 % (ref 38.5–50.0)
Hemoglobin: 13.8 g/dL (ref 13.2–17.1)
Lymphs Abs: 1296 cells/uL (ref 850–3900)
MCH: 30.3 pg (ref 27.0–33.0)
MCHC: 33.3 g/dL (ref 32.0–36.0)
MCV: 90.8 fL (ref 80.0–100.0)
MPV: 10.2 fL (ref 7.5–12.5)
Monocytes Relative: 7.9 %
Neutro Abs: 5148 cells/uL (ref 1500–7800)
Neutrophils Relative %: 71.5 %
Platelets: 274 10*3/uL (ref 140–400)
RBC: 4.56 10*6/uL (ref 4.20–5.80)
RDW: 14.6 % (ref 11.0–15.0)
Total Lymphocyte: 18 %
WBC: 7.2 10*3/uL (ref 3.8–10.8)

## 2019-08-08 LAB — COMPLETE METABOLIC PANEL WITH GFR
AG Ratio: 2 (calc) (ref 1.0–2.5)
ALT: 21 U/L (ref 9–46)
AST: 34 U/L (ref 10–35)
Albumin: 4.5 g/dL (ref 3.6–5.1)
Alkaline phosphatase (APISO): 68 U/L (ref 35–144)
BUN: 13 mg/dL (ref 7–25)
CO2: 27 mmol/L (ref 20–32)
Calcium: 9.9 mg/dL (ref 8.6–10.3)
Chloride: 103 mmol/L (ref 98–110)
Creat: 0.88 mg/dL (ref 0.70–1.25)
GFR, Est African American: 104 mL/min/{1.73_m2} (ref >=60)
GFR, Est Non African American: 90 mL/min/{1.73_m2} (ref >=60)
Globulin: 2.3 g/dL (calc) (ref 1.9–3.7)
Glucose, Bld: 272 mg/dL — ABNORMAL HIGH (ref 65–99)
Potassium: 4.9 mmol/L (ref 3.5–5.3)
Sodium: 138 mmol/L (ref 135–146)
Total Bilirubin: 0.4 mg/dL (ref 0.2–1.2)
Total Protein: 6.8 g/dL (ref 6.1–8.1)

## 2019-08-08 LAB — HEMOGLOBIN A1C
Hgb A1c MFr Bld: 8.5 % of total Hgb — ABNORMAL HIGH (ref <5.7)
Mean Plasma Glucose: 197 (calc)
eAG (mmol/L): 10.9 (calc)

## 2019-08-08 LAB — LIPID PANEL
Cholesterol: 111 mg/dL (ref <200)
HDL: 34 mg/dL — ABNORMAL LOW (ref >=40)
LDL Cholesterol (Calc): 46 mg/dL (calc)
Non-HDL Cholesterol (Calc): 77 mg/dL (calc) (ref <130)
Total CHOL/HDL Ratio: 3.3 (calc) (ref <5.0)
Triglycerides: 293 mg/dL — ABNORMAL HIGH (ref <150)

## 2019-08-08 LAB — TSH: TSH: 1.87 mIU/L (ref 0.40–4.50)

## 2019-08-08 LAB — MAGNESIUM: Magnesium: 1.7 mg/dL (ref 1.5–2.5)

## 2019-08-10 ENCOUNTER — Other Ambulatory Visit: Payer: Self-pay | Admitting: Internal Medicine

## 2019-08-19 ENCOUNTER — Other Ambulatory Visit: Payer: Self-pay | Admitting: Internal Medicine

## 2019-08-21 ENCOUNTER — Other Ambulatory Visit: Payer: Self-pay | Admitting: Adult Health

## 2019-08-21 MED ORDER — TRULICITY 1.5 MG/0.5ML ~~LOC~~ SOAJ
1.5000 mg | SUBCUTANEOUS | 3 refills | Status: DC
Start: 2019-08-21 — End: 2019-09-02

## 2019-08-25 ENCOUNTER — Other Ambulatory Visit: Payer: Self-pay | Admitting: Internal Medicine

## 2019-08-25 MED ORDER — ROSUVASTATIN CALCIUM 5 MG PO TABS: ORAL_TABLET | ORAL | 0 refills | Status: DC

## 2019-09-01 ENCOUNTER — Other Ambulatory Visit: Payer: Self-pay | Admitting: Internal Medicine

## 2019-09-01 DIAGNOSIS — M549 Dorsalgia, unspecified: Secondary | ICD-10-CM

## 2019-09-01 DIAGNOSIS — G8929 Other chronic pain: Secondary | ICD-10-CM

## 2019-09-01 MED ORDER — METHOCARBAMOL 500 MG PO TABS: 500.0000 mg | ORAL_TABLET | Freq: Every day | ORAL | Status: DC | PRN

## 2019-09-02 ENCOUNTER — Other Ambulatory Visit: Payer: Self-pay

## 2019-09-02 ENCOUNTER — Encounter: Payer: Self-pay | Admitting: Podiatry

## 2019-09-02 ENCOUNTER — Ambulatory Visit: Payer: Medicare Other | Admitting: Podiatry

## 2019-09-02 DIAGNOSIS — M79676 Pain in unspecified toe(s): Secondary | ICD-10-CM | POA: Diagnosis not present

## 2019-09-02 DIAGNOSIS — B351 Tinea unguium: Secondary | ICD-10-CM | POA: Diagnosis not present

## 2019-09-02 DIAGNOSIS — Q828 Other specified congenital malformations of skin: Secondary | ICD-10-CM

## 2019-09-02 DIAGNOSIS — G8929 Other chronic pain: Secondary | ICD-10-CM

## 2019-09-02 DIAGNOSIS — M549 Dorsalgia, unspecified: Secondary | ICD-10-CM

## 2019-09-02 DIAGNOSIS — E1142 Type 2 diabetes mellitus with diabetic polyneuropathy: Secondary | ICD-10-CM | POA: Diagnosis not present

## 2019-09-02 MED ORDER — METHOCARBAMOL 500 MG PO TABS: 500.0000 mg | ORAL_TABLET | Freq: Every day | ORAL | 0 refills | Status: DC | PRN

## 2019-09-02 MED ORDER — TRULICITY 1.5 MG/0.5ML ~~LOC~~ SOAJ: 1.5000 mg | SUBCUTANEOUS | 12 refills | Status: DC

## 2019-09-02 MED ORDER — MELOXICAM 7.5 MG PO TABS: ORAL_TABLET | ORAL | 0 refills | Status: DC

## 2019-09-02 NOTE — Progress Notes (Signed)
He presents today with a history of diabetes and diabetic peripheral neuropathy with chief complaint of painfully elongated toenails.  Objective: Toenails are long thick yellow dystrophic-like mycotic painful palpation.  Assessment: Pain in limb secondary to onychomycosis and diabetic peripheral neuropathy.  Plan: Debridement of toenails 1 through 5 bilateral.

## 2019-09-03 ENCOUNTER — Telehealth: Payer: Self-pay

## 2019-09-03 NOTE — Telephone Encounter (Signed)
The [employer] Patient Assistance Program [facility]) has reviewed the application for the patient identified. The patient meets program eligibility requirements and is enrolled in [place] until the end of the calendar year.

## 2019-09-17 NOTE — Progress Notes (Signed)
Assessment and Plan:  Jason Wyatt was seen today for follow-up.  Diagnoses and all orders for this visit:  Type 2 diabetes mellitus with other diabetic kidney complication, without long-term current use of insulin (HCC) Morbid obesity (HCC) Tolerating 0.75 mg weekly without complications, seeing benefit Increase to 1.5 mg weekly; sending in to [place] care for financial support/assistance Initial weight goal <300 lb, long term goal 225 lb Glucose goal - fasting <130 Long discussion about weight loss, diet, and exercise Recommended diet heavy in fruits and veggies and low in animal meats, cheeses, and dairy products, appropriate calorie intake Follow up in 6 weeks for routine follow up and labs -     Dulaglutide (TRULICITY) 1.5 RK/2.7CW SOPN; Inject 0.5 mLs (1.5 mg total) into the skin once a week.  Further disposition pending results of labs. Discussed med's effects and SE's.   Over 15 minutes of exam, counseling, chart review, and critical decision making was performed.   Future Appointments  Date Time Provider [place]  11/27/2019 10:30 AM Garnet Sierras, NP [facility] None  12/02/2019  8:15 AM [name], [name], DPM [facility]  08/19/2020 10:00 AM Vicie Mutters, PA-C [facility] None    ------------------------------------------------------------------------------------------------------------------   HPI BP (!) 126/94   Pulse (!) 111   Temp (!) 94.3 F (34.6 C)   Wt (!) 314 lb (142.4 kg)   SpO2 95%   BMI 40.87 kg/m   64 y.o.male with morbid obesity and uncontrolled T2DM presents for 6 week follow up on initiation of trulicity. He has been taking 0.75 mg weekly for 5 weeks.   BMI is Body mass index is 40.87 kg/m., he has been working on diet and exercise. He reports has noted reduced appetite, some days will do only very light supper He has been reducing potato intake, smaller portions On home scale has noted down from 314 lb to 310 lb Denies any notable SE  since starting medication Wt Readings from Last 3 Encounters:  09/18/19 (!) 314 lb (142.4 kg)  08/07/19 (!) 315 lb (142.9 kg)  04/03/19 (!) 317 lb (143.8 kg)    He has been working on diet and exercise for T2DM, and denies foot ulcerations, hypoglycemia , increased appetite, nausea, paresthesia of the feet, polydipsia, polyuria and visual disturbances. He reports fasting sugars are improved from low 200s to average of 179. Last A1C in the office was:  Lab Results  Component Value Date   HGBA1C 8.5 (H) 08/07/2019    Past Medical History:  Diagnosis Date  . Acute meniscal tear of left knee   . Allergic rhinitis   . Arthritis     back  . Borderline glaucoma   . Chronic back pain    stenosis  . CKD (chronic kidney disease), stage II   . Complication of anesthesia cervical fusion surgery 06/2010   "woke up next day w/ventilator on" told due to OSA  . GERD (gastroesophageal reflux disease)    takes Prevacid daily  . Gout    takes Allopurinol daily and Colchicine if needed  . History of adenomatous polyp of colon    tubular adenoma's  . History of Barrett's esophagus   . History of hiatal hernia   . Hyperlipidemia   . Hypertension   . Incomplete right bundle branch block (RBBB)   . OA (osteoarthritis)    knee  . OSA on CPAP   . Peripheral neuropathy   . Prostate cancer [facility]) urologist-  dr Alinda Money   dx 2013  s/p  radical non-nerve sparing prostatectomy 2014--  Stage pT2cNx,  PSA 3.48,  Gleason 3+3,  vol 55cc/  current PSA  0.01 (Aug 2016)  . Rupture of Achilles tendon   . Thyroid nodule    left side  . Type II diabetes mellitus ([place])   . Urinary frequency    takes Flomax daily  . Urine incontinence   . Wears glasses      Allergies  Allergen Reactions  . Fenofibrate Itching and Rash  . Flaxseed [Linseed Oil] Rash  . Lyrica [Pregabalin] Itching and Rash  . Niaspan [Niacin Er] Rash    Current Outpatient Medications on File Prior to Visit  Medication Sig  . Accu-Chek  Softclix Lancets lancets USE WITH METER TO CHECK  BLOOD SUGAR ONCE A DAY  . acetaminophen (TYLENOL) 500 MG tablet Take 500 mg by mouth every 6 (six) hours as needed for moderate pain.  [name] acyclovir (ZOVIRAX) 400 MG tablet TAKE 1 TABLET BY MOUTH  DAILY AS NEEDED FOR COLD  SORES  . allopurinol (ZYLOPRIM) 300 MG tablet TAKE 1 TABLET BY MOUTH  DAILY TO PREVENT GOUT  . aspirin 81 MG tablet Take 81 mg by mouth daily.  . carbidopa-levodopa (SINEMET IR) 25-250 MG tablet TAKE 1 TABLET BY MOUTH  TWICE DAILY FOR RESTLESS  LEGS  . dextromethorphan-guaiFENesin (MUCINEX DM) 30-600 MG 12hr tablet Take 1 tablet by mouth 2 (two) times daily as needed for cough.  Mariane Baumgarten Calcium (STOOL SOFTENER PO) Take 250 mg by mouth daily.   . DULoxetine (CYMBALTA) 60 MG capsule TAKE 1 CAPSULE BY MOUTH AT  BEDTIME  . enalapril (VASOTEC) 20 MG tablet TAKE 1 TABLET BY MOUTH  TWICE DAILY FOR BLOOD  PRESSURE  . famotidine (PEPCID) 20 MG tablet Take 1 tablet [place] to Prevent Heartburn & Indigestion  . fexofenadine (ALLEGRA) 180 MG tablet Take 180 mg by mouth daily as needed for allergies.   [name] gabapentin (NEURONTIN) 600 MG tablet TAKE 1/2 TO 1 TABLET BY  MOUTH 2 TO 3 TIMES DAILY AS NEEDED FOR PAIN  . Magnesium 500 MG CAPS Take 1,500 mg by mouth every evening.   . meloxicam (MOBIC) 7.5 MG tablet Take 1 tablet Daily  with Food  is needed for Pain  . metFORMIN (GLUCOPHAGE-XR) 500 MG 24 hr tablet Take 2 tablets 2 x /day with Meals for Diabetes  . methocarbamol (ROBAXIN) 500 MG tablet Take 1 tablet (500 mg total) by mouth daily as needed for muscle spasms.  . Multiple Vitamin (MULTIVITAMIN) tablet Take 1 tablet by mouth daily.  Glory Rosebush ULTRA test strip USE TO CHECK BLOOD SUGAR  DAILY  . pantoprazole (PROTONIX) 40 MG tablet Take 1 tablet Daily for Heartburn & Indigestion  . rosuvastatin (CRESTOR) 5 MG tablet Take 1 tablet Daily for Cholesterol  . sennosides-docusate sodium (SENOKOT-S) 8.6-50 MG tablet Take 1 tablet by mouth daily.    . tamsulosin (FLOMAX) 0.4 MG CAPS capsule TAKE 1 CAPSULE BY MOUTH  DAILY AFTER SUPPER  . Vitamin D, Ergocalciferol, (DRISDOL) 50000 units CAPS capsule Take 1 capsule (50,000 Units total) by mouth as directed. Tuesday , Wednesday, Thursday, Sunday (Patient taking differently: Take 50,000 Units by mouth 2 (two) times a week. Wednesday and Sunday)   No current facility-administered medications on file prior to visit.    ROS: all negative except above.   Physical Exam:  BP (!) 126/94   Pulse (!) 111   Temp (!) 94.3 F (34.6 C)   Wt (!) 314 lb (142.4 kg)  SpO2 95%   BMI 40.87 kg/m   General Appearance: Well nourished, in no apparent distress. Eyes: PERRLA, conjunctiva no swelling or erythema ENT/Mouth: Mask in place; Hearing normal.  Neck: Supple, thyroid normal.  Respiratory: Respiratory effort normal, BS equal bilaterally without rales, rhonchi, wheezing or stridor.  Cardio: RRR with no MRGs. Brisk peripheral pulses without edema.  Abdomen: Soft, obese abdomen, + BS.  Non tender, no guarding, rebound, hernias, masses. Lymphatics: Non tender without lymphadenopathy.  Musculoskeletal: Full ROM, 5/5 strength, normal gait.  Skin: Warm, dry without rashes, lesions, ecchymosis.  Neuro:  Normal muscle tone, no cerebellar symptoms. Sensation intact.  Psych: Awake and oriented X 3, normal affect, Insight and Judgment appropriate.     Izora Ribas, NP 9:15 AM [facility]

## 2019-09-18 ENCOUNTER — Other Ambulatory Visit: Payer: Self-pay

## 2019-09-18 ENCOUNTER — Ambulatory Visit (INDEPENDENT_AMBULATORY_CARE_PROVIDER_SITE_OTHER): Payer: Medicare Other | Admitting: Adult Health

## 2019-09-18 ENCOUNTER — Encounter: Payer: Self-pay | Admitting: Adult Health

## 2019-09-18 VITALS — BP 126/94 | HR 111 | Temp 94.3°F | Wt 314.0 lb

## 2019-09-18 DIAGNOSIS — E1129 Type 2 diabetes mellitus with other diabetic kidney complication: Secondary | ICD-10-CM

## 2019-09-18 MED ORDER — TRULICITY 1.5 MG/0.5ML ~~LOC~~ SOAJ: 1.5000 mg | SUBCUTANEOUS | 12 refills | Status: DC

## 2019-10-01 DIAGNOSIS — M4326 Fusion of spine, lumbar region: Secondary | ICD-10-CM | POA: Diagnosis not present

## 2019-10-01 DIAGNOSIS — M545 Low back pain: Secondary | ICD-10-CM | POA: Diagnosis not present

## 2019-10-01 DIAGNOSIS — M7918 Myalgia, other site: Secondary | ICD-10-CM | POA: Diagnosis not present

## 2019-10-26 ENCOUNTER — Other Ambulatory Visit: Payer: Self-pay | Admitting: Internal Medicine

## 2019-11-06 ENCOUNTER — Ambulatory Visit: Payer: Medicare Other | Attending: Internal Medicine

## 2019-11-06 DIAGNOSIS — Z23 Encounter for immunization: Secondary | ICD-10-CM

## 2019-11-06 NOTE — Progress Notes (Signed)
° °  Covid-19 Vaccination Clinic  Name:  TAEJON IRANI    MRN: [id number] DOB: [birth year]  11/06/2019  Mr. Guiney was observed post Covid-19 immunization for 15 minutes without incident. He was provided with Vaccine Information Sheet and instruction to access the V-Safe system.   Mr. Depuy was instructed to call 911 with any severe reactions post vaccine:  Difficulty breathing   Swelling of face and throat   A fast heartbeat   A bad rash all over body   Dizziness and weakness

## 2019-11-26 ENCOUNTER — Encounter: Payer: Medicare Other | Admitting: Adult Health

## 2019-11-26 NOTE — Progress Notes (Signed)
Patient ID: Jason Wyatt, male   DOB: 1955/12/03, 64 y.o.   MRN: [id number]  3 month follow up and Medicare wellness  Assessment and Plan  Diagnoses and all orders for this visit:  Encounter for CPE  Essential hypertension Continue medication Monitor blood pressure at home more closely; call if consistently over 130/80 Continue DASH diet.   Reminder to go to the ER if any CP, SOB, nausea, dizziness, severe HA, changes vision/speech, left arm numbness and tingling and jaw pain.  Atherosclerotic peripheral vascular disease (HCC) Control blood pressure, cholesterol, glucose, increase exercise.   Obesity hypoventilation syndrome Continue CPAP, weight loss encouraged  Type 2 diabetes mellitus with CKD ([place]) Education: Reviewed ABCs of diabetes management (respective goals in parentheses):  A1C (<7), blood pressure (<130/80), and cholesterol (LDL <70) Eye Exam yearly and Dental Exam every 6 months. Dietary recommendations Physical Activity recommendations Persistently above goal with max dose metformin; now on trulicity, tolerating well, titrate up from 1.5 mg to 3 mg weekly;  - check lipid panel  CKD II associated with T2DM (HCC) Increase fluids, avoid NSAIDS, control sugars and blood pressure, will monitor - CMP/GFR  Hyperlipidemia associated with T2DM (HCC) Continue medications; at LDL goal <70 Continue low cholesterol diet and exercise.  Check lipid panel.   DM type 2 with diabetic peripheral neuropathy (HCC) Check feet at home daily, call with changes/concerns Followed closely by podiatry q43mEmphasized need for improved glucose control -   Primary osteoarthritis of left knee Followed by ortho  CKD (chronic kidney disease), stage II Increase fluids, avoid NSAIDS, monitor sugars, will monitor  RLS Continue meds, increase walking, monitor iron/B12  Morbid obesity (BMI 41+) Long discussion about weight loss, diet, and exercise Recommended diet heavy in fruits and  veggies and low in animal meats, cheeses, and dairy products, appropriate calorie intake Discussed appropriate weight for height  Discussed restricting portions of starches, increase high fiber, information given Will start trulicity for glucose and weight benefits Follow up at next visit  Medication management CBC, CMP/GFR, magnesium  Idiopathic chronic gout without tophus, unspecified site Continue allopurinol Diet discussed Check uric acid as needed  Hx of prostate cancer S/p robotic excision, followed by Dr. HLouis Meckel Flomax for residual LUTS  Orders Placed This Encounter  Procedures   Flu vaccine HIGH DOSE PF   CBC with Differential/Platelet   COMPLETE METABOLIC PANEL WITH GFR   Magnesium   Lipid panel   TSH   Hemoglobin A1c   VITAMIN D 25 Hydroxy (Vit-D Deficiency, Fractures)   Uric acid   PSA   Microalbumin / creatinine urine ratio   Urinalysis, Routine w reflex microscopic   EKG 12-Lead   HM DIABETES FOOT EXAM      During the course of the visit the patient was educated and counseled about appropriate screening and preventive services including:    Pneumococcal vaccine   Influenza vaccine  Td vaccine  Screening electrocardiogram  Screening mammography  Bone densitometry screening  Colorectal cancer screening  Diabetes screening  Glaucoma screening  Nutrition counseling   Advanced directives: given information/requested  Future Appointments  Date Time Provider [place] 12/02/2019  8:15 AM HGarrel Ridgel DPM [facility]  08/19/2020 10:00 AM MGarnet Sierras NP [facility] None  11/29/2020 10:00 AM CLiane Comber NP [facility] None    HPI 64y.o. male  presents for CPE and 3 month follow up with hypertension, hyperlipidemia, diabetes and vitamin D deficiency.  He is married, 2 sons, no grandchildren. Enjoys his  dog. He is semi retired Company secretary.   He is on CPAP for OSA/obesity hypoventilation syndrome and  endorses 100% compliance and restorative sleep.   He has imbalance attributed to advanced peripheral neuropathy, no falls this year, does ambulate without cane if needed. Son drives him when needed.  He follows with Dr. [name] for back pain. Follows with podiatry Dr. [name] for diabetic neuropathy.  He is on sinemet BID for RLS. Also takes gabapentin 600 mg TID PRN.   He is taking flomax in the AM per urology for sense of incomplete bladder emptying; hx of prostate cancer and robotic excision in 2014 and followed by Dr. [name] at [facility]. PSAs have remained <0.1.   he has a diagnosis of GERD which is currently well managed by pantoprazole 40 mg  BMI is Body mass index is 41.53 kg/m., he has not been working on diet and exercise due back/joint pain. Admits he eats potatoes daily, knows he needs to do better with diet.  He reports on home scale down to 297 lb, he is down to last hole on belt Wt Readings from Last 3 Encounters:  11/27/19 (!) 314 lb 12.8 oz (142.8 kg)  09/18/19 (!) 314 lb (142.4 kg)  08/07/19 (!) 315 lb (142.9 kg)   His blood pressure has been controlled at home, today their BP is BP: 140/86  He does workout, walks daily with dog. He denies chest pain, shortness of breath, dizziness.   He is on cholesterol medication, on rosuvastatin 5 mg every other day denies myalgias. His cholesterol is not at goal. The cholesterol last visit was:   Lab Results  Component Value Date   CHOL 111 08/07/2019   HDL 34 (L) 08/07/2019   LDLCALC 46 08/07/2019   TRIG 293 (H) 08/07/2019   CHOLHDL 3.3 08/07/2019    He has been working on diet  for Type 2 diabetes on metformin 2000 mg daily, recently started on trulicity 1.5 mg weekly, and denies foot ulcerations, increased appetite, nausea, polydipsia, polyuria, visual disturbances, vomiting and weight loss.  Neuropathy follows with podiatry, on 600 mg BID Diabetic eye - scheduled next month with Dr. Einar Gip He has been checking  fasting glucose, log demonstrates improved from 180-200 to 140-160s, spikes if he snacks late in the evening  Last A1C in the office was:  Lab Results  Component Value Date   HGBA1C 8.5 (H) 08/07/2019   He has CKD II associated with T2DM monitored at this office. On enalapril. Last GFR:  Lab Results  Component Value Date   GFRNONAA 90 08/07/2019   Patient is on Vitamin D supplement, taking 50000 IU every other day    Lab Results  Component Value Date   VD25OH 89 04/03/2019     Patient is on allopurinol for gout and does not report a recent flare.  Lab Results  Component Value Date   LABURIC 6.0 04/03/2019   Last PSA:  Lab Results  Component Value Date   PSA <0.1 11/26/2018   PSA <0.1 11/22/2016   PSA <0.01 05/21/2015      Current Outpatient Medications  Medication Instructions   Accu-Chek Softclix Lancets lancets USE WITH METER TO CHECK  BLOOD SUGAR ONCE DAILY   acetaminophen (TYLENOL) 500 mg, Oral, Every 6 hours PRN   acyclovir (ZOVIRAX) 400 MG tablet TAKE 1 TABLET BY MOUTH  DAILY AS NEEDED FOR COLD  SORES   allopurinol (ZYLOPRIM) 300 MG tablet TAKE 1 TABLET BY MOUTH  DAILY TO PREVENT GOUT  aspirin 81 mg, Oral, Daily   carbidopa-levodopa (SINEMET IR) 25-250 MG tablet TAKE 1 TABLET BY MOUTH  TWICE DAILY FOR RESTLESS  LEGS   dextromethorphan-guaiFENesin (MUCINEX DM) 30-600 MG 12hr tablet 1 tablet, Oral, 2 times daily PRN   DULoxetine (CYMBALTA) 60 MG capsule TAKE 1 CAPSULE BY MOUTH AT  BEDTIME   enalapril (VASOTEC) 20 MG tablet TAKE 1 TABLET BY MOUTH  TWICE DAILY FOR BLOOD  PRESSURE   fexofenadine (ALLEGRA) 180 mg, Oral, Daily PRN   gabapentin (NEURONTIN) 600 MG tablet TAKE 1/2 TO 1 TABLET BY  MOUTH 2 TO 3 TIMES DAILY AS NEEDED FOR PAIN   Lancet Devices (ONETOUCH DELICA PLUS LANCING) MISC Use Lancing Device to check blood sugar daily for Diabetes.   Magnesium 1,500 mg, Oral, Every evening   meloxicam (MOBIC) 7.5 MG tablet Take 1 tablet Daily  with Food  is  needed for Pain   metFORMIN (GLUCOPHAGE-XR) 500 MG 24 hr tablet Take    2 tablets    2 x /day    with Meals for Diabetes   methocarbamol (ROBAXIN) 500 mg, Oral, Daily PRN   Multiple Vitamin (MULTIVITAMIN) tablet 1 tablet, Oral, Daily   ONETOUCH ULTRA test strip USE TO CHECK BLOOD SUGAR  DAILY   pantoprazole (PROTONIX) 40 MG tablet Take    1 tablet    Daily     to Prevent Heartburn & Indigestion   rosuvastatin (CRESTOR) 5 MG tablet Take one tablet every other day for cholesterol.   sennosides-docusate sodium (SENOKOT-S) 8.6-50 MG tablet 1 tablet, Oral, Daily   tamsulosin (FLOMAX) 0.4 MG CAPS capsule TAKE 1 CAPSULE BY MOUTH  DAILY AFTER SUPPER   Trulicity 3 mg, Injection, Weekly   Vitamin D (Ergocalciferol) (DRISDOL) 50,000 Units, Oral, As directed, Tuesday , Wednesday, Thursday, Sunday     Medical History:  Past Medical History:  Diagnosis Date   Acute meniscal tear of left knee    Allergic rhinitis    Arthritis     back   Borderline glaucoma    Chronic back pain    stenosis   CKD (chronic kidney disease), stage II    Complication of anesthesia cervical fusion surgery 06/2010   "woke up next day w/ventilator on" told due to OSA   GERD (gastroesophageal reflux disease)    takes Prevacid daily   Gout    takes Allopurinol daily and Colchicine if needed   History of adenomatous polyp of colon    tubular adenoma's   History of Barrett's esophagus    History of hiatal hernia    Hyperlipidemia    Hypertension    Incomplete right bundle branch block (RBBB)    OA (osteoarthritis)    knee   OSA on CPAP    Peripheral neuropathy    Prostate cancer [facility]) urologist-  dr Alinda Money   dx 2013  s/p  radical non-nerve sparing prostatectomy 2014--  Stage pT2cNx,  PSA 3.48,  Gleason 3+3,  vol 55cc/  current PSA  0.01 (Aug 2016)   Rupture of Achilles tendon    Thyroid nodule    left side   Type II diabetes mellitus ([place])    Urinary frequency    takes Flomax  daily   Urine incontinence    Wears glasses    Allergies Allergies  Allergen Reactions   Fenofibrate Itching and Rash   Flaxseed [Linseed Oil] Rash   Lyrica [Pregabalin] Itching and Rash   Niaspan [Niacin Er] Rash   Preventative Medicine Immunization History  Administered  Date(s) Administered   Influenza Inj Mdck Quad With Preservative 11/22/2016, 12/04/2017   Influenza Split 10/17/2013, 10/29/2014   Influenza, High Dose Seasonal PF 11/26/2018, 11/27/2019   Influenza, Seasonal, Injecte, Preservative Fre 11/29/2015   Influenza-Unspecified 11/04/2012   PFIZER SARS-COV-2 Vaccination 03/10/2019, 03/31/2019, 11/06/2019   PPD Test 01/16/2006, 04/07/2013   Pneumococcal Conjugate-13 07/29/2018   Pneumococcal Polysaccharide-23 04/07/2013, 11/22/2016   Pneumococcal-Unspecified 09/17/2010   Td 01/17/2011   Zoster 01/16/2005   Colonoscopy 2016 - Dr. Cristina Gong - due 09/2019, per patient met early 2021 and was advised ok to postpone to next year, due 2022 EGD 2013 Echo 02/2018, EF 55-60%  Tetanus: 2013 Influenza: 11/2018, TODAY  Pneumonia: 2015, 2018 Prevnar 13: 07/2018 Shingles: 2007 Covid 19: 3/3, 2021, pfizer  Vision: Dr. Einar Gip, 01/03/2019, report verified and abstracted ,has scheduled next month  Dental: Dr. Gloriann Loan, 10/2019, goes q10m Patient Care Team: MUnk Pinto MD as PCP - General (Internal Medicine) BLorretta Harp MD as PCP - Cardiology (Cardiology) HGarrel Ridgel DPM as Consulting Physician (Podiatry) MWebb Laws [place] Referring Physician (Optometry) JMartinique Peter M, MD as Consulting Physician (Cardiology) BRonald Lobo MD as Consulting Physician (Gastroenterology) BSusa Day MD as Consulting Physician (Orthopedic Surgery) HArdis Hughs MD as Attending Physician (Urology)   SURGICAL HISTORY He  has a past surgical history that includes Posterior fusion cervical spine (07-05-2010); Esophagogastroduodenoscopy (09/26/2011);  Robot assisted laparoscopic radical prostatectomy (02/07/2012); wisdom teeth extracted  (1982); left and right heart catheterization with coronary angiogram (N/A, 08/25/2011); Anal fissure repair (06-05-2002); Cardiovascular stress test (01-01-2008); transthoracic echocardiogram (08-03-2011); Posterior lumbar fusion (05-18-2014); Uvulopalatopharyngoplasty (uppp)/tonsillectomy/septoplasty (1990's); Knee arthroscopy with medial menisectomy (Left, 12/31/2014); Cardiac catheterization; Back surgery; Total knee arthroplasty (Left, 08/05/2015); Gastroc recession extremity (Left, 06/27/2017); Achilles tendon surgery (Left, 06/27/2017); Tendon transfer (Left, 06/27/2017); Carpal tunnel release (Left, 03/27/2018); and Carpal tunnel release (Right, 06/2018). FAMILY HISTORY His family history includes Cancer in his father; Dementia in his mother; Heart attack in his maternal grandmother; Hyperlipidemia in his sister; Hypertension in his mother, paternal grandfather, and sister; Osteoarthritis in his mother; Osteoporosis in his mother; Stroke in his maternal grandmother. SOCIAL HISTORY He  reports that he has never smoked. He has never used smokeless tobacco. He reports that he does not drink alcohol and does not use drugs.  Depression/mood screen:   [facility] 11/27/2019  Decreased Interest 0  Down, Depressed, Hopeless 0  PHQ - 2 Score 0  Altered sleeping -  Tired, decreased energy -  Change in appetite -  Feeling bad or failure about yourself  -  Trouble concentrating -  Moving slowly or fidgety/restless -  Suicidal thoughts -  PHQ-9 Score -  Difficult doing work/chores -  Some recent data might be hidden      Review of Systems:  Review of Systems  Constitutional: Negative for chills, diaphoresis, fever, malaise/fatigue and weight loss.  HENT: Negative for congestion, ear pain, hearing loss, sore throat and tinnitus.   Eyes: Negative.  Negative for blurred vision and double vision.   Respiratory: Negative for cough, sputum production, shortness of breath and wheezing.   Cardiovascular: Negative for chest pain, palpitations, orthopnea, claudication, leg swelling and PND.  Gastrointestinal: Negative for abdominal pain, blood in stool, constipation, diarrhea, heartburn, melena, nausea and vomiting.  Genitourinary: Negative.   Musculoskeletal: Positive for back pain. Negative for falls, joint pain and myalgias.  Skin: Negative.  Negative for rash.  Neurological: Positive for tingling (bil fingers) and sensory change (chronic numbness of bil feet). Negative  for dizziness, loss of consciousness, weakness and headaches.  Endo/Heme/Allergies: Negative for polydipsia.  Psychiatric/Behavioral: Negative.  Negative for depression, memory loss, substance abuse and suicidal ideas. The patient is not nervous/anxious and does not have insomnia.   All other systems reviewed and are negative.   Physical Exam: BP 140/86    Pulse 82    Temp (!) 96.8 F (36 C)    Ht _0  (1.854 m)    Wt (!) 314 lb 12.8 oz (142.8 kg)    SpO2 96%    BMI 41.53 kg/m  Wt Readings from Last 3 Encounters:  11/27/19 (!) 314 lb 12.8 oz (142.8 kg)  09/18/19 (!) 314 lb (142.4 kg)  08/07/19 (!) 315 lb (142.9 kg)   General Appearance: Well nourished well developed, non-toxic appearing, in no apparent distress. Eyes: PERRLA, EOMs, conjunctiva no swelling or erythema ENT/Mouth: Ear canals clear with no erythema, swelling, or discharge.  TMs normal bilaterally, oropharynx clear, moist, with no exudate.   Neck: Supple, thyroid normal, no JVD, no cervical adenopathy.  Respiratory: Respiratory effort normal, breath sounds clear A&P, no wheeze, rhonchi or rales noted.  No retractions, no accessory muscle usage Cardio: RRR with no MRGs. No edema noted Abdomen: Soft, + BS, morbidly obese abdomen limiting exam.  Non tender, no guarding, rebound, hernias, he has mild ventral hernia with bearing down. Musculoskeletal: Full  ROM, 5/5 strength, antalgic gait Skin: Warm, dry without rashes, lesions, ecchymosis. Calluses to bilateral feet without ulcers or wounds. Neuro: Awake and oriented X 3, Cranial nerves intact. No cerebellar symptoms. He has 0/10 to monofilament in bilateral feet.  Psych: normal affect, Insight and Judgment appropriate.  GU: defer to urology, declines today   EKG: sinus rhythm with 1st degree AV block, NSCPT  Had essentially normal ECHO 03/06/2018- mildly increased left ventricular wall only    Izora Ribas, NP 11:55 AM [facility]

## 2019-11-27 ENCOUNTER — Ambulatory Visit (INDEPENDENT_AMBULATORY_CARE_PROVIDER_SITE_OTHER): Payer: Medicare Other | Admitting: Adult Health

## 2019-11-27 ENCOUNTER — Other Ambulatory Visit: Payer: Self-pay

## 2019-11-27 ENCOUNTER — Encounter: Payer: Self-pay | Admitting: Adult Health

## 2019-11-27 VITALS — BP 140/86 | HR 82 | Temp 96.8°F | Ht 73.0 in | Wt 314.8 lb

## 2019-11-27 DIAGNOSIS — E1122 Type 2 diabetes mellitus with diabetic chronic kidney disease: Secondary | ICD-10-CM

## 2019-11-27 DIAGNOSIS — E1129 Type 2 diabetes mellitus with other diabetic kidney complication: Secondary | ICD-10-CM

## 2019-11-27 DIAGNOSIS — Z23 Encounter for immunization: Secondary | ICD-10-CM | POA: Diagnosis not present

## 2019-11-27 DIAGNOSIS — I70209 Unspecified atherosclerosis of native arteries of extremities, unspecified extremity: Secondary | ICD-10-CM

## 2019-11-27 DIAGNOSIS — G2581 Restless legs syndrome: Secondary | ICD-10-CM

## 2019-11-27 DIAGNOSIS — Z0001 Encounter for general adult medical examination with abnormal findings: Secondary | ICD-10-CM

## 2019-11-27 DIAGNOSIS — E559 Vitamin D deficiency, unspecified: Secondary | ICD-10-CM

## 2019-11-27 DIAGNOSIS — Z1389 Encounter for screening for other disorder: Secondary | ICD-10-CM

## 2019-11-27 DIAGNOSIS — E1169 Type 2 diabetes mellitus with other specified complication: Secondary | ICD-10-CM

## 2019-11-27 DIAGNOSIS — E662 Morbid (severe) obesity with alveolar hypoventilation: Secondary | ICD-10-CM

## 2019-11-27 DIAGNOSIS — Z0000 Encounter for general adult medical examination without abnormal findings: Secondary | ICD-10-CM

## 2019-11-27 DIAGNOSIS — E1142 Type 2 diabetes mellitus with diabetic polyneuropathy: Secondary | ICD-10-CM

## 2019-11-27 DIAGNOSIS — Z136 Encounter for screening for cardiovascular disorders: Secondary | ICD-10-CM

## 2019-11-27 DIAGNOSIS — N182 Chronic kidney disease, stage 2 (mild): Secondary | ICD-10-CM

## 2019-11-27 DIAGNOSIS — I10 Essential (primary) hypertension: Secondary | ICD-10-CM | POA: Diagnosis not present

## 2019-11-27 DIAGNOSIS — M1712 Unilateral primary osteoarthritis, left knee: Secondary | ICD-10-CM

## 2019-11-27 DIAGNOSIS — Z79899 Other long term (current) drug therapy: Secondary | ICD-10-CM

## 2019-11-27 DIAGNOSIS — M1A00X0 Idiopathic chronic gout, unspecified site, without tophus (tophi): Secondary | ICD-10-CM

## 2019-11-27 DIAGNOSIS — Z8546 Personal history of malignant neoplasm of prostate: Secondary | ICD-10-CM

## 2019-11-27 DIAGNOSIS — E785 Hyperlipidemia, unspecified: Secondary | ICD-10-CM

## 2019-11-27 DIAGNOSIS — Z1329 Encounter for screening for other suspected endocrine disorder: Secondary | ICD-10-CM

## 2019-11-27 MED ORDER — ROSUVASTATIN CALCIUM 5 MG PO TABS: ORAL_TABLET | ORAL | 3 refills | Status: DC

## 2019-11-27 MED ORDER — TRULICITY 3 MG/0.5ML ~~LOC~~ SOAJ: 3.0000 mg | SUBCUTANEOUS | 2 refills | Status: DC

## 2019-11-27 MED ORDER — ONETOUCH DELICA PLUS LANCING MISC: 0 refills | Status: DC

## 2019-11-27 NOTE — Patient Instructions (Addendum)
[name] , Thank you for taking time to come for your Medicare Wellness Visit. I appreciate your ongoing commitment to your health goals. Please review the following plan we discussed and let me know if I can assist you in the future.   These are the goals we discussed: Goals    . Fasting Blood Glucose<130    . Weight (lb) < 300 lb (136.1 kg)       This is a list of the screening recommended for you and due dates:  Health Maintenance  Topic Date Due  . Flu Shot  08/17/2019  . Colon Cancer Screening  01/17/2020*  . Eye exam for diabetics  01/03/2020  . Hemoglobin A1C  02/07/2020  . Complete foot exam   11/26/2020  . Tetanus Vaccine  01/16/2021  . Pneumonia vaccines (2 of 2 - PPSV23) 11/22/2021  . COVID-19 Vaccine  Completed  .  Hepatitis C: One time screening is recommended by Center for Disease Control  (CDC) for  adults born from [birth year] through 1967.   Completed  *Topic was postponed. The date shown is not the original due date.    Try tylenol as needed for hand pain, can also try voltaren (diclofenac) gel can do 3-4 times a day      Aim for 7+ servings of fruits and vegetables daily  65-80+ fluid ounces of water or unsweet tea for healthy kidneys  Limit to max 1 drink of alcohol per day; avoid smoking/tobacco  Limit animal fats in diet for cholesterol and heart health - choose grass fed whenever available  Avoid highly processed foods, and foods high in saturated/trans fats  Aim for low stress - take time to unwind and care for your mental health  Aim for 150 min of moderate intensity exercise weekly for heart health, and weights twice weekly for bone health  Aim for 7-9 hours of sleep daily       High-Fiber Diet Fiber, also called dietary fiber, is a type of carbohydrate that is found in fruits, vegetables, whole grains, and beans. A high-fiber diet can have many health benefits. Your health care provider may recommend a high-fiber diet to help:  Prevent  constipation. Fiber can make your bowel movements more regular.  Lower your cholesterol.  Relieve the following conditions: ? Swelling of veins in the anus (hemorrhoids). ? Swelling and irritation (inflammation) of specific areas of the digestive tract (uncomplicated diverticulosis). ? A problem of the large intestine (colon) that sometimes causes pain and diarrhea (irritable bowel syndrome, IBS).  Prevent overeating as part of a weight-loss plan.  Prevent heart disease, type 2 diabetes, and certain cancers. What is my plan? The recommended daily fiber intake in grams (g) includes:  38 g for men age 65 or younger.  30 g for men over age 25.  28 g for women age 74 or younger.  21 g for women over age 66. You can get the recommended daily intake of dietary fiber by:  Eating a variety of fruits, vegetables, grains, and beans.  Taking a fiber supplement, if it is not possible to get enough fiber through your diet. What do I need to know about a high-fiber diet?  It is better to get fiber through food sources rather than from fiber supplements. There is not a lot of research about how effective supplements are.  Always check the fiber content on the nutrition facts label of any prepackaged food. Look for foods that contain 5 g of fiber  or more per serving.  Talk with a diet and nutrition specialist (dietitian) if you have questions about specific foods that are recommended or not recommended for your medical condition, especially if those foods are not listed below.  Gradually increase how much fiber you consume. If you increase your intake of dietary fiber too quickly, you may have bloating, cramping, or gas.  Drink plenty of water. Water helps you to digest fiber. What are tips for following this plan?  Eat a wide variety of high-fiber foods.  Make sure that half of the grains that you eat each day are whole grains.  Eat breads and cereals that are made with whole-grain flour  instead of refined flour or white flour.  Eat brown rice, bulgur wheat, or millet instead of white rice.  Start the day with a breakfast that is high in fiber, such as a cereal that contains 5 g of fiber or more per serving.  Use beans in place of meat in soups, salads, and pasta dishes.  Eat high-fiber snacks, such as berries, raw vegetables, nuts, and popcorn.  Choose whole fruits and vegetables instead of processed forms like juice or sauce. What foods can I eat?  Fruits Berries. Pears. Apples. Oranges. Avocado. Prunes and raisins. Dried figs. Vegetables Sweet potatoes. Spinach. Kale. Artichokes. Cabbage. Broccoli. Cauliflower. Green peas. Carrots. Squash. Grains Whole-grain breads. Multigrain cereal. Oats and oatmeal. Brown rice. Barley. Bulgur wheat. [place]. Quinoa. Bran muffins. Popcorn. Rye wafer crackers. Meats and other proteins Navy, kidney, and pinto beans. Soybeans. Split peas. Lentils. Nuts and seeds. Dairy Fiber-fortified yogurt. Beverages Fiber-fortified soy milk. Fiber-fortified orange juice. Other foods Fiber bars. The items listed above may not be a complete list of recommended foods and beverages. Contact a dietitian for more options. What foods are not recommended? Fruits Fruit juice. Cooked, strained fruit. Vegetables Fried potatoes. Canned vegetables. Well-cooked vegetables. Grains White bread. Pasta made with refined flour. White rice. Meats and other proteins Fatty cuts of meat. Fried chicken or fried fish. Dairy Milk. Yogurt. Cream cheese. Sour cream. Fats and oils Butters. Beverages Soft drinks. Other foods Cakes and pastries. The items listed above may not be a complete list of foods and beverages to avoid. Contact a dietitian for more information. Summary  Fiber is a type of carbohydrate. It is found in fruits, vegetables, whole grains, and beans.  There are many health benefits of eating a high-fiber diet, such as preventing constipation,  lowering blood cholesterol, helping with weight loss, and reducing your risk of heart disease, diabetes, and certain cancers.  Gradually increase your intake of fiber. Increasing too fast can result in cramping, bloating, and gas. Drink plenty of water while you increase your fiber.  The best sources of fiber include whole fruits and vegetables, whole grains, nuts, seeds, and beans. This information is not intended to replace advice given to you by your health care provider. Make sure you discuss any questions you have with your health care provider. Document Revised: 11/06/2016 Document Reviewed: 11/06/2016 Elsevier Patient Education  2020 [employer].

## 2019-11-28 ENCOUNTER — Other Ambulatory Visit: Payer: Self-pay | Admitting: Adult Health

## 2019-11-28 DIAGNOSIS — E785 Hyperlipidemia, unspecified: Secondary | ICD-10-CM

## 2019-11-28 DIAGNOSIS — E1169 Type 2 diabetes mellitus with other specified complication: Secondary | ICD-10-CM

## 2019-11-28 LAB — URIC ACID: Uric Acid, Serum: 6.3 mg/dL (ref 4.0–8.0)

## 2019-11-28 LAB — URINALYSIS, ROUTINE W REFLEX MICROSCOPIC
Bilirubin Urine: NEGATIVE
Glucose, UA: NEGATIVE
Hgb urine dipstick: NEGATIVE
Ketones, ur: NEGATIVE
Leukocytes,Ua: NEGATIVE
Nitrite: NEGATIVE
Specific Gravity, Urine: 1.016 (ref 1.001–1.03)
pH: 7 (ref 5.0–8.0)

## 2019-11-28 LAB — HEMOGLOBIN A1C
Hgb A1c MFr Bld: 8.2 % of total Hgb — ABNORMAL HIGH (ref <5.7)
Mean Plasma Glucose: 189 (calc)
eAG (mmol/L): 10.4 (calc)

## 2019-11-28 LAB — COMPLETE METABOLIC PANEL WITH GFR
AG Ratio: 1.9 (calc) (ref 1.0–2.5)
ALT: 18 U/L (ref 9–46)
AST: 29 U/L (ref 10–35)
Albumin: 4.4 g/dL (ref 3.6–5.1)
Alkaline phosphatase (APISO): 63 U/L (ref 35–144)
BUN: 11 mg/dL (ref 7–25)
CO2: 28 mmol/L (ref 20–32)
Calcium: 9.8 mg/dL (ref 8.6–10.3)
Chloride: 103 mmol/L (ref 98–110)
Creat: 0.94 mg/dL (ref 0.70–1.25)
GFR, Est African American: 98 mL/min/{1.73_m2} (ref >=60)
GFR, Est Non African American: 84 mL/min/{1.73_m2} (ref >=60)
Globulin: 2.3 g/dL (calc) (ref 1.9–3.7)
Glucose, Bld: 150 mg/dL — ABNORMAL HIGH (ref 65–99)
Potassium: 4.7 mmol/L (ref 3.5–5.3)
Sodium: 141 mmol/L (ref 135–146)
Total Bilirubin: 0.5 mg/dL (ref 0.2–1.2)
Total Protein: 6.7 g/dL (ref 6.1–8.1)

## 2019-11-28 LAB — LIPID PANEL
Cholesterol: 106 mg/dL (ref <200)
HDL: 39 mg/dL — ABNORMAL LOW (ref >=40)
LDL Cholesterol (Calc): 38 mg/dL (calc)
Non-HDL Cholesterol (Calc): 67 mg/dL (calc) (ref <130)
Total CHOL/HDL Ratio: 2.7 (calc) (ref <5.0)
Triglycerides: 217 mg/dL — ABNORMAL HIGH (ref <150)

## 2019-11-28 LAB — CBC WITH DIFFERENTIAL/PLATELET
Absolute Monocytes: 591 cells/uL (ref 200–950)
Basophils Absolute: 73 cells/uL (ref 0–200)
Basophils Relative: 1 %
Eosinophils Absolute: 110 cells/uL (ref 15–500)
Eosinophils Relative: 1.5 %
HCT: 39.7 % (ref 38.5–50.0)
Hemoglobin: 13.4 g/dL (ref 13.2–17.1)
Lymphs Abs: 1256 cells/uL (ref 850–3900)
MCH: 29.6 pg (ref 27.0–33.0)
MCHC: 33.8 g/dL (ref 32.0–36.0)
MCV: 87.6 fL (ref 80.0–100.0)
MPV: 9.7 fL (ref 7.5–12.5)
Monocytes Relative: 8.1 %
Neutro Abs: 5271 cells/uL (ref 1500–7800)
Neutrophils Relative %: 72.2 %
Platelets: 269 10*3/uL (ref 140–400)
RBC: 4.53 10*6/uL (ref 4.20–5.80)
RDW: 14.5 % (ref 11.0–15.0)
Total Lymphocyte: 17.2 %
WBC: 7.3 10*3/uL (ref 3.8–10.8)

## 2019-11-28 LAB — MICROALBUMIN / CREATININE URINE RATIO
Creatinine, Urine: 105 mg/dL (ref 20–320)
Microalb Creat Ratio: 211 mcg/mg creat — ABNORMAL HIGH (ref <30)
Microalb, Ur: 22.2 mg/dL

## 2019-11-28 LAB — MAGNESIUM: Magnesium: 1.6 mg/dL (ref 1.5–2.5)

## 2019-11-28 LAB — VITAMIN D 25 HYDROXY (VIT D DEFICIENCY, FRACTURES): Vit D, 25-Hydroxy: 98 ng/mL (ref 30–100)

## 2019-11-28 LAB — TSH: TSH: 1.87 mIU/L (ref 0.40–4.50)

## 2019-11-28 LAB — PSA: PSA: <0.04 ng/mL (ref <=4.0)

## 2019-11-28 MED ORDER — ROSUVASTATIN CALCIUM 5 MG PO TABS: ORAL_TABLET | ORAL | 3 refills | Status: DC

## 2019-11-30 ENCOUNTER — Other Ambulatory Visit: Payer: Self-pay | Admitting: Internal Medicine

## 2019-11-30 DIAGNOSIS — E1169 Type 2 diabetes mellitus with other specified complication: Secondary | ICD-10-CM

## 2019-12-02 ENCOUNTER — Other Ambulatory Visit: Payer: Self-pay

## 2019-12-02 ENCOUNTER — Ambulatory Visit: Payer: Medicare Other | Admitting: Podiatry

## 2019-12-02 ENCOUNTER — Encounter: Payer: Self-pay | Admitting: Podiatry

## 2019-12-02 DIAGNOSIS — B351 Tinea unguium: Secondary | ICD-10-CM | POA: Diagnosis not present

## 2019-12-02 DIAGNOSIS — Q828 Other specified congenital malformations of skin: Secondary | ICD-10-CM | POA: Diagnosis not present

## 2019-12-02 DIAGNOSIS — M79676 Pain in unspecified toe(s): Secondary | ICD-10-CM | POA: Diagnosis not present

## 2019-12-02 DIAGNOSIS — E1142 Type 2 diabetes mellitus with diabetic polyneuropathy: Secondary | ICD-10-CM

## 2019-12-02 NOTE — Progress Notes (Signed)
He presents today for follow-up of his diabetic peripheral neuropathy painful toenails and calluses.  He denies any trauma.  Objective: Vital signs are stable he is alert and oriented x3.  Pulses are palpable.  Small noncomplicated excoriation dorsal lateral aspect of the left foot overlying the fourth metatarsal shaft.  No signs of infection.  Toenails are long thick yellow dystrophic clinically mycotic.  Reactive hyperkeratotic lesions plantar aspect of the forefoot and on the hallux.  Hammertoe deformities noted bilateral.  Assessment: Pain in limb secondary to diabetic peripheral neuropathy hammertoe deformity, painful onychomycosis.  Excoriation left foot.  Plan: At this point debrided all reactive hyperkeratotic tissue debrided nails 1 through 5 bilaterally.  For the excoriation he will place a Band-Aid with a small amount of Neosporin.

## 2019-12-03 ENCOUNTER — Other Ambulatory Visit: Payer: Self-pay

## 2019-12-03 DIAGNOSIS — E1129 Type 2 diabetes mellitus with other diabetic kidney complication: Secondary | ICD-10-CM

## 2019-12-03 MED ORDER — BLOOD GLUCOSE MONITORING SUPPL DEVI: 0 refills | Status: DC

## 2019-12-31 DIAGNOSIS — M545 Low back pain, unspecified: Secondary | ICD-10-CM | POA: Diagnosis not present

## 2019-12-31 DIAGNOSIS — M4326 Fusion of spine, lumbar region: Secondary | ICD-10-CM | POA: Diagnosis not present

## 2019-12-31 DIAGNOSIS — M7918 Myalgia, other site: Secondary | ICD-10-CM | POA: Diagnosis not present

## 2020-01-02 DIAGNOSIS — E119 Type 2 diabetes mellitus without complications: Secondary | ICD-10-CM | POA: Diagnosis not present

## 2020-01-02 DIAGNOSIS — H524 Presbyopia: Secondary | ICD-10-CM | POA: Diagnosis not present

## 2020-01-02 LAB — HM DIABETES EYE EXAM

## 2020-01-19 ENCOUNTER — Telehealth: Payer: Self-pay

## 2020-01-19 NOTE — Telephone Encounter (Signed)
The patient meets program eligibility requirements and is enrolled in [place] [facility] until the end of the calendar year.

## 2020-02-12 DIAGNOSIS — H2512 Age-related nuclear cataract, left eye: Secondary | ICD-10-CM | POA: Diagnosis not present

## 2020-02-12 DIAGNOSIS — H18413 Arcus senilis, bilateral: Secondary | ICD-10-CM | POA: Diagnosis not present

## 2020-02-12 DIAGNOSIS — H2513 Age-related nuclear cataract, bilateral: Secondary | ICD-10-CM | POA: Diagnosis not present

## 2020-02-12 DIAGNOSIS — H25013 Cortical age-related cataract, bilateral: Secondary | ICD-10-CM | POA: Diagnosis not present

## 2020-02-12 DIAGNOSIS — H25043 Posterior subcapsular polar age-related cataract, bilateral: Secondary | ICD-10-CM | POA: Diagnosis not present

## 2020-02-23 ENCOUNTER — Other Ambulatory Visit: Payer: Self-pay | Admitting: Internal Medicine

## 2020-02-24 ENCOUNTER — Other Ambulatory Visit: Payer: Self-pay | Admitting: Internal Medicine

## 2020-02-24 DIAGNOSIS — E1169 Type 2 diabetes mellitus with other specified complication: Secondary | ICD-10-CM

## 2020-03-02 ENCOUNTER — Ambulatory Visit: Payer: Medicare Other | Admitting: Podiatry

## 2020-03-02 ENCOUNTER — Encounter: Payer: Self-pay | Admitting: Podiatry

## 2020-03-02 ENCOUNTER — Other Ambulatory Visit: Payer: Self-pay

## 2020-03-02 DIAGNOSIS — M79676 Pain in unspecified toe(s): Secondary | ICD-10-CM | POA: Diagnosis not present

## 2020-03-02 DIAGNOSIS — Q828 Other specified congenital malformations of skin: Secondary | ICD-10-CM

## 2020-03-02 DIAGNOSIS — E1142 Type 2 diabetes mellitus with diabetic polyneuropathy: Secondary | ICD-10-CM | POA: Diagnosis not present

## 2020-03-02 DIAGNOSIS — B351 Tinea unguium: Secondary | ICD-10-CM

## 2020-03-02 DIAGNOSIS — M2042 Other hammer toe(s) (acquired), left foot: Secondary | ICD-10-CM

## 2020-03-02 DIAGNOSIS — M2041 Other hammer toe(s) (acquired), right foot: Secondary | ICD-10-CM

## 2020-03-02 NOTE — Progress Notes (Signed)
He presents today chief complaint of painfully elongated toenails numbness and tingling in his foot preulcerative lesions hammertoe deformities and calluses.  Objective: Vital signs are stable he is alert oriented x3 pulses remain palpable hammertoe deformities noted flexible bilateral does demonstrate reactive hyper keratomas to distal aspect of the toes was debrided do demonstrate preulcerative lesions there is no open and bleeding areas today.  Toenails are long thick yellow dystrophic-like mycotic multiple reactive hyper keratomas plantar aspect of the forefoot bilaterally none open none bleeding no signs of infection.  Toenails are long thick yellow dystrophic clinically mycotic.  Assessment: Diabetic peripheral neuropathy hammertoe deformity distal clavi preulcerative.  Pain limb secondary to onychomycosis and reactive hyper keratomas benign skin lesions.  Plan: Debridement of benign skin lesions.  Debridement of preulcerative lesions distal clavi.  Debridement of toenails 1 through 5 bilaterally.  Recommending extra-depth diabetic shoes to help prevent the ulcerative lesions in the loss of his digits.

## 2020-03-03 ENCOUNTER — Encounter: Payer: Self-pay | Admitting: Internal Medicine

## 2020-03-03 NOTE — Progress Notes (Signed)
Patient ID: Jason Wyatt, male   DOB: [birth year], 65 y.o.   MRN: [id number]  3 month follow up and Medicare wellness  Assessment and Plan  Diagnoses and all orders for this visit:  Encounter for annual medicare wellness   Essential hypertension Continue medication Monitor blood pressure at home more closely; call if consistently over 130/80 Continue DASH diet.   Reminder to go to the ER if any CP, SOB, nausea, dizziness, severe HA, changes vision/speech, left arm numbness and tingling and jaw pain.  Obesity hypoventilation syndrome Continue CPAP, weight loss encouraged  Type 2 diabetes mellitus with CKD [facility]) Education: Reviewed 'ABCs' of diabetes management (respective goals in parentheses):  A1C (<7), blood pressure (<130/80), and cholesterol (LDL <70) Eye Exam yearly and Dental Exam every 6 months. Dietary recommendations Physical Activity recommendations Persistently above goal with max dose metformin; now on trulicity, tolerating well, titrate up from 1.5 mg to 3 mg weekly;  - check lipid panel  CKD II associated with T2DM (HCC) Increase fluids, avoid NSAIDS, control sugars and blood pressure, will monitor - CMP/GFR  Hyperlipidemia associated with T2DM (HCC) Continue medications; at LDL goal <70 Continue low cholesterol diet and exercise.  Check lipid panel.   DM type 2 with diabetic peripheral neuropathy (HCC) Check feet at home daily, call with changes/concerns Followed closely by podiatry q42mEmphasized need for improved glucose control -   Primary osteoarthritis of left knee Followed by ortho  CKD (chronic kidney disease), stage II Increase fluids, avoid NSAIDS, monitor sugars, will monitor  RLS Continue meds, increase walking, monitor iron/B12  Morbid obesity (BMI 41+) Long discussion about weight loss, diet, and exercise Recommended diet heavy in fruits and veggies and low in animal meats, cheeses, and dairy products, appropriate calorie intake Discussed  appropriate weight for height  Discussed restricting portions of starches, increase high fiber, information given Will start trulicity for glucose and weight benefits Follow up at next visit  Medication management CBC, CMP/GFR, magnesium  Idiopathic chronic gout without tophus, unspecified site Continue allopurinol Diet discussed Check uric acid as needed  Hx of prostate cancer S/p robotic excision, followed by Dr. HLouis Meckel Flomax for residual LUTS  Gait instability R/t neuropathy; denies falls; encouraged cane if needed Can refer to PT if getting worse; declines at this time  Orders Placed This Encounter  Procedures  . CBC with Differential/Platelet  . COMPLETE METABOLIC PANEL WITH GFR  . Magnesium  . Lipid panel  . TSH  . Hemoglobin A1c     Future Appointments  Date Time Provider DStanley 03/22/2020  8:15 AM [facility]elora [facility]  06/03/2020  9:15 AM HMilinda Pointer [name], DPM [facility]  06/16/2020 10:30 AM MUnk Pinto MD [facility] None  11/29/2020 10:00 AM CLiane Comber NP [facility] None  03/04/2021 11:00 AM CLiane Comber NP [facility] None     Plan:   During the course of the visit the patient was educated and counseled about appropriate screening and preventive services including:    Pneumococcal vaccine   Prevnar 13  Influenza vaccine  Td vaccine  Screening electrocardiogram  Bone densitometry screening  Colorectal cancer screening  Diabetes screening  Glaucoma screening  Nutrition counseling   Advanced directives: requested    HPI BP 138/74   Pulse 97   Temp (!) 93.6 F (34.2 C)   Wt (!) 311 lb (141.1 kg)   SpO2 93%   BMI 41.03 kg/m   65y.o. male  presents for AWV and 3 month follow  up with hypertension, hyperlipidemia, diabetes and vitamin D deficiency. He has CKD stage 2 due to type 2 diabetes mellitus ([place]); Morbid obesity ([place]); Obesity hypoventilation syndrome ([employer]); T2_NIDDM w/ CKD 2 ; Gout;  Hyperlipidemia associated with type 2 diabetes mellitus ([place]); Hypertension; RLS; Medication management; DM type 2 with diabetic peripheral neuropathy ([place]); Normal coronary arteries; Primary osteoarthritis of left knee; Gait instability; Bilateral carpal tunnel syndrome; Heart rate fast; and History of prostate cancer on their problem list.  He is married, 2 sons, no grandchildren. Enjoys his dog. He is semi retired Company secretary.  He is scheduled for cataract surgery bil in the upcoming month.   He is on CPAP for OSA/obesity hypoventilation syndrome and endorses 100% compliance and restorative sleep.   He has imbalance attributed to advanced peripheral neuropathy, no falls this year, does ambulate without cane if needed. Son drives him when needed. He follows with Dr. [name] for back pain. Follows with podiatry Dr. [name] for diabetic neuropathy.  He is on sinemet BID for RLS. Also takes gabapentin 600 mg TID PRN.   He is taking flomax in the AM per urology for sense of incomplete bladder emptying; hx of prostate cancer and robotic excision in 2014 and followed by Dr. [name] at [facility]. PSAs have remained <0.1.   he has a diagnosis of GERD which is currently well managed by pantoprazole 40 mg  BMI is Body mass index is 41.03 kg/m., he has not been working on diet and exercise due back/joint pain. Admits he eats potatoes daily, knows he needs to do better with diet.  He reports on home scale down to 305 lb, he is down to last hole on belt, notes pants are getting too loose.  Wt Readings from Last 3 Encounters:  03/04/20 (!) 311 lb (141.1 kg)  11/27/19 (!) 314 lb 12.8 oz (142.8 kg)  09/18/19 (!) 314 lb (142.4 kg)   His blood pressure has been controlled at home, today their BP is BP: 138/74  He does workout, walks daily with dog. He denies chest pain, shortness of breath, dizziness.   He is on cholesterol medication, on rosuvastatin 5 mg every other day denies myalgias. His  cholesterol is not at goal. The cholesterol last visit was:   Lab Results  Component Value Date   CHOL 106 11/27/2019   HDL 39 (L) 11/27/2019   LDLCALC 38 11/27/2019   TRIG 217 (H) 11/27/2019   CHOLHDL 2.7 11/27/2019    He has been working on diet  for Type 2 diabetes on metformin 2000 mg daily, recently started on trulicity 3 mg weekly, and denies foot ulcerations, increased appetite, nausea, polydipsia, polyuria, visual disturbances, vomiting and weight loss.  Neuropathy follows with podiatry, on 600 mg BID Diabetic eye - scheduled next month with Dr. Einar Gip He has been checking fasting glucose, log demonstrates improved from 180-200 to 118-140 spikes if he snacks late in the evening  Last A1C in the office was:  Lab Results  Component Value Date   HGBA1C 8.2 (H) 11/27/2019   He has CKD II associated with T2DM monitored at this office. On enalapril. Last GFR:  Lab Results  Component Value Date   GFRNONAA 84 11/27/2019   Patient is on Vitamin D supplement, taking 50000 IU every other day    Lab Results  Component Value Date   VD25OH 98 11/27/2019     Patient is on allopurinol for gout and does not report a recent flare.  Lab Results  Component  Value Date   LABURIC 6.3 11/27/2019      Current Outpatient Medications  Medication Instructions  . Accu-Chek Softclix Lancets lancets USE WITH METER TO CHECK  BLOOD SUGAR ONCE DAILY  . acetaminophen (TYLENOL) 500 mg, Oral, Every 6 hours PRN  . acyclovir (ZOVIRAX) 400 MG tablet TAKE 1 TABLET BY MOUTH  DAILY AS NEEDED FOR COLD  SORES  . allopurinol (ZYLOPRIM) 300 MG tablet TAKE 1 TABLET BY MOUTH  DAILY TO PREVENT GOUT  . aspirin 81 mg, Oral, Daily  . Blood Glucose Monitoring Suppl DEVI Use to check blood sugar daily  . carbidopa-levodopa (SINEMET IR) 25-250 MG tablet TAKE 1 TABLET BY MOUTH  TWICE DAILY FOR RESTLESS  LEGS  . dextromethorphan-guaiFENesin (MUCINEX DM) 30-600 MG 12hr tablet 1 tablet, Oral, 2 times daily PRN  .  DULoxetine (CYMBALTA) 60 MG capsule TAKE 1 CAPSULE BY MOUTH AT  BEDTIME  . enalapril (VASOTEC) 20 MG tablet TAKE 1 TABLET BY MOUTH  TWICE DAILY FOR BLOOD  PRESSURE  . gabapentin (NEURONTIN) 600 MG tablet TAKE 1/2 TO 1 TABLET BY  MOUTH 2 TO 3 TIMES DAILY AS NEEDED FOR PAIN  . Magnesium 1,500 mg, Oral, Every evening  . meloxicam (MOBIC) 7.5 MG tablet Take 1 tablet Daily  with Food  is needed for Pain  . metFORMIN (GLUCOPHAGE-XR) 500 MG 24 hr tablet Take  2 tablets  2 x /day  with Meals for Diabetes  . methocarbamol (ROBAXIN) 500 mg, Oral, Daily PRN  . Multiple Vitamin (MULTIVITAMIN) tablet 1 tablet, Oral, Daily  . pantoprazole (PROTONIX) 40 MG tablet Take  1 tablet  Daily  to Prevent Heartburn & Indigestion  . rosuvastatin (CRESTOR) 5 MG tablet TAKE 1 TABLET BY MOUTH  DAILY FOR CHOLESTEROL  . sennosides-docusate sodium (SENOKOT-S) 8.6-50 MG tablet 1 tablet, Oral, Daily  . tamsulosin (FLOMAX) 0.4 MG CAPS capsule TAKE 1 CAPSULE BY MOUTH  DAILY AFTER SUPPER  . Trulicity 3 mg, Injection, Weekly  . Vitamin D (Ergocalciferol) (DRISDOL) 50,000 Units, Oral, As directed, Tuesday , Wednesday, Thursday, Sunday     Medical History:  Past Medical History:  Diagnosis Date  . Acute meniscal tear of left knee   . Allergic rhinitis   . Arthritis     back  . Borderline glaucoma   . Chronic back pain    stenosis  . CKD (chronic kidney disease), stage II   . Complication of anesthesia cervical fusion surgery 06/2010   "woke up next day w/ventilator on" told due to OSA  . GERD (gastroesophageal reflux disease)    takes Prevacid daily  . Gout    takes Allopurinol daily and Colchicine if needed  . History of adenomatous polyp of colon    tubular adenoma's  . History of Barrett's esophagus   . History of hiatal hernia   . Hyperlipidemia   . Hypertension   . Incomplete right bundle branch block (RBBB)   . OA (osteoarthritis)    knee  . OSA on CPAP   . Peripheral neuropathy   . Prostate cancer [facility])  urologist-  dr Alinda Money   dx 2013  s/p  radical non-nerve sparing prostatectomy 2014--  Stage pT2cNx,  PSA 3.48,  Gleason 3+3,  vol 55cc/  current PSA  0.01 (Aug 2016)  . Rupture of Achilles tendon   . Thyroid nodule    left side  . Type II diabetes mellitus ([place])   . Urinary frequency    takes Flomax daily  . Urine incontinence   .  Wears glasses    Allergies Allergies  Allergen Reactions  . Fenofibrate Itching and Rash  . Flaxseed [Linseed Oil] Rash  . Lyrica [Pregabalin] Itching and Rash  . Niaspan [Niacin Er] Rash   Preventative Medicine Immunization History  Administered Date(s) Administered  . Influenza Inj Mdck Quad With Preservative 11/22/2016, 12/04/2017  . Influenza Split 10/17/2013, 10/29/2014  . Influenza, High Dose Seasonal PF 11/26/2018, 11/27/2019  . Influenza, Seasonal, Injecte, Preservative Fre 11/29/2015  . Influenza-Unspecified 11/04/2012  . PFIZER(Purple Top)SARS-COV-2 Vaccination 03/10/2019, 03/31/2019, 11/06/2019  . PPD Test 01/16/2006, 04/07/2013  . Pneumococcal Conjugate-13 07/29/2018  . Pneumococcal Polysaccharide-23 04/07/2013, 11/22/2016  . Pneumococcal-Unspecified 09/17/2010  . Td 01/17/2011  . Zoster 01/16/2005    Colonoscopy 2016 - Dr. Cristina Gong - due 09/2019, per patient met early 2021 and was advised ok to postpone to next year, due 09/2020 EGD 2013 Echo 02/2018, EF 55-60%  Tetanus: 2013 Influenza: 11/2019 Pneumonia: 2015, 2018 Prevnar 13: 07/2018 Shingles: 2007 Covid 19: 3/3, 2021, pfizer  Vision: Dr. Einar Gip, 01/02/2020, report verified and abstracted, has cataracts scheduled in the next month with Dr. Tommy Rainwater Dental: Dr. Gloriann Loan, 10/2019, goes q28m has scheduled 03/2020  Patient Care Team: MUnk Pinto MD as PCP - General (Internal Medicine) BLorretta Harp MD as PCP - Cardiology (Cardiology) HGarrel Ridgel DPM as Consulting Physician (Podiatry) MWebb Laws OHooperas Referring Physician (Optometry) JMartinique Peter M, MD as  Consulting Physician (Cardiology) BRonald Lobo MD as Consulting Physician (Gastroenterology) BSusa Day MD as Consulting Physician (Orthopedic Surgery) HArdis Hughs MD as Attending Physician (Urology)   SURGICAL HISTORY He  has a past surgical history that includes Posterior fusion cervical spine (07-05-2010); Esophagogastroduodenoscopy (09/26/2011); Robot assisted laparoscopic radical prostatectomy (02/07/2012); wisdom teeth extracted  (1982); left and right heart catheterization with coronary angiogram (N/A, 08/25/2011); Anal fissure repair (06-05-2002); Cardiovascular stress test (01-01-2008); transthoracic echocardiogram (08-03-2011); Posterior lumbar fusion (05-18-2014); Uvulopalatopharyngoplasty (uppp)/tonsillectomy/septoplasty (1990's); Knee arthroscopy with medial menisectomy (Left, 12/31/2014); Cardiac catheterization; Back surgery; Total knee arthroplasty (Left, 08/05/2015); Gastroc recession extremity (Left, 06/27/2017); Achilles tendon surgery (Left, 06/27/2017); Tendon transfer (Left, 06/27/2017); Carpal tunnel release (Left, 03/27/2018); and Carpal tunnel release (Right, 06/2018). FAMILY HISTORY His family history includes Cancer in his father; Dementia in his mother; Heart attack in his maternal grandmother; Hyperlipidemia in his sister; Hypertension in his mother, paternal grandfather, and sister; Osteoarthritis in his mother; Osteoporosis in his mother; Stroke in his maternal grandmother. SOCIAL HISTORY He  reports that he has never smoked. He has never used smokeless tobacco. He reports that he does not drink alcohol and does not use drugs.  MEDICARE WELLNESS OBJECTIVES: Physical activity: Current Exercise Habits: The patient does not participate in regular exercise at present, Exercise limited by: orthopedic condition(s);neurologic condition(s) Cardiac risk factors: Cardiac Risk Factors include: advanced age (>52m, >6>64omen);dyslipidemia;hypertension;obesity (BMI  >30kg/m2);sedentary lifestyle;diabetes mellitus;male gender Depression/mood screen:   [facility]9 03/04/2020  Decreased Interest 0  Down, Depressed, Hopeless 0  PHQ - 2 Score 0  Altered sleeping -  Tired, decreased energy -  Change in appetite -  Feeling bad or failure about yourself  -  Trouble concentrating -  Moving slowly or fidgety/restless -  Suicidal thoughts -  PHQ-9 Score -  Difficult doing work/chores -  Some recent data might be hidden    ADLs:  In your present state of health, do you have any difficulty performing the following activities: 03/04/2020 08/07/2019  Hearing? N N  Vision? Y N  Comment getting cataracts fixed next week -  Difficulty concentrating or making decisions? N N  Walking or climbing stairs? N Y  Comment - manages around home, uses buggy out in [employer] or bathing? N N  Doing errands, shopping? N N  Some recent data might be hidden     Cognitive Testing  Alert? Yes  Normal Appearance?Yes  Oriented to person? Yes  Place? Yes   Time? Yes  Recall of three objects?  Yes  Can perform simple calculations? Yes  Displays appropriate judgment?Yes  Can read the correct time from a watch face?Yes  EOL planning: Does Patient Have a Medical Advance Directive?: No Would patient like information on creating a medical advance directive?: No - Patient declined     Review of Systems:  Review of Systems  Constitutional: Negative for chills, diaphoresis, fever, malaise/fatigue and weight loss.  HENT: Negative for congestion, ear pain, hearing loss, sore throat and tinnitus.   Eyes: Negative.  Negative for blurred vision and double vision.  Respiratory: Negative for cough, sputum production, shortness of breath and wheezing.   Cardiovascular: Negative for chest pain, palpitations, orthopnea, claudication, leg swelling and PND.  Gastrointestinal: Negative for abdominal pain, blood in stool, constipation, diarrhea, heartburn, melena,  nausea and vomiting.  Genitourinary: Negative.   Musculoskeletal: Positive for back pain. Negative for falls, joint pain and myalgias.  Skin: Negative.  Negative for rash.  Neurological: Positive for tingling (bil fingers) and sensory change (chronic numbness of bil feet). Negative for dizziness, loss of consciousness, weakness and headaches.  Endo/Heme/Allergies: Negative for polydipsia.  Psychiatric/Behavioral: Negative.  Negative for depression, memory loss, substance abuse and suicidal ideas. The patient is not nervous/anxious and does not have insomnia.   All other systems reviewed and are negative.   Physical Exam: BP 138/74   Pulse 97   Temp (!) 93.6 F (34.2 C)   Wt (!) 311 lb (141.1 kg)   SpO2 93%   BMI 41.03 kg/m  Wt Readings from Last 3 Encounters:  03/04/20 (!) 311 lb (141.1 kg)  11/27/19 (!) 314 lb 12.8 oz (142.8 kg)  09/18/19 (!) 314 lb (142.4 kg)   General Appearance: Well nourished well developed, non-toxic appearing, in no apparent distress. Eyes: PERRLA, EOMs, conjunctiva no swelling or erythema ENT/Mouth: Ear canals clear with no erythema, swelling, or discharge.  TMs normal bilaterally, oropharynx clear, moist, with no exudate.   Neck: Supple, thyroid normal, no JVD, no cervical adenopathy.  Respiratory: Respiratory effort normal, breath sounds clear A&P, no wheeze, rhonchi or rales noted.  No retractions, no accessory muscle usage Cardio: RRR with no MRGs. No edema noted. Intact pedal and post tibial pulses, arm feet.  Abdomen: Soft, + BS, morbidly obese abdomen limiting exam.  Non tender, no guarding, rebound, hernias, he has mild ventral hernia with bearing down. Musculoskeletal: Full ROM, 5/5 strength, antalgic gait Skin: Warm, dry without rashes, lesions, ecchymosis. Calluses to bilateral feet without ulcers or wounds. Neuro: Awake and oriented X 3, Cranial nerves intact. No cerebellar symptoms. He has 0/10 to monofilament in bilateral feet.  Psych: normal  affect, Insight and Judgment appropriate.    Had essentially normal ECHO 03/06/2018- mildly increased left ventricular wall only    Izora Ribas, NP 11:45 AM [facility]

## 2020-03-04 ENCOUNTER — Ambulatory Visit (INDEPENDENT_AMBULATORY_CARE_PROVIDER_SITE_OTHER): Payer: Medicare Other | Admitting: Adult Health

## 2020-03-04 ENCOUNTER — Encounter: Payer: Self-pay | Admitting: Adult Health

## 2020-03-04 ENCOUNTER — Other Ambulatory Visit: Payer: Self-pay

## 2020-03-04 VITALS — BP 138/74 | HR 97 | Temp 93.6°F | Wt 311.0 lb

## 2020-03-04 DIAGNOSIS — E1129 Type 2 diabetes mellitus with other diabetic kidney complication: Secondary | ICD-10-CM

## 2020-03-04 DIAGNOSIS — D649 Anemia, unspecified: Secondary | ICD-10-CM | POA: Diagnosis not present

## 2020-03-04 DIAGNOSIS — I10 Essential (primary) hypertension: Secondary | ICD-10-CM

## 2020-03-04 DIAGNOSIS — Z0001 Encounter for general adult medical examination with abnormal findings: Secondary | ICD-10-CM

## 2020-03-04 DIAGNOSIS — R2681 Unsteadiness on feet: Secondary | ICD-10-CM | POA: Diagnosis not present

## 2020-03-04 DIAGNOSIS — Z79899 Other long term (current) drug therapy: Secondary | ICD-10-CM | POA: Diagnosis not present

## 2020-03-04 DIAGNOSIS — E785 Hyperlipidemia, unspecified: Secondary | ICD-10-CM | POA: Diagnosis not present

## 2020-03-04 DIAGNOSIS — M1712 Unilateral primary osteoarthritis, left knee: Secondary | ICD-10-CM

## 2020-03-04 DIAGNOSIS — E1169 Type 2 diabetes mellitus with other specified complication: Secondary | ICD-10-CM | POA: Diagnosis not present

## 2020-03-04 DIAGNOSIS — N182 Chronic kidney disease, stage 2 (mild): Secondary | ICD-10-CM

## 2020-03-04 DIAGNOSIS — Z136 Encounter for screening for cardiovascular disorders: Secondary | ICD-10-CM

## 2020-03-04 DIAGNOSIS — Z8546 Personal history of malignant neoplasm of prostate: Secondary | ICD-10-CM

## 2020-03-04 DIAGNOSIS — E1122 Type 2 diabetes mellitus with diabetic chronic kidney disease: Secondary | ICD-10-CM | POA: Diagnosis not present

## 2020-03-04 DIAGNOSIS — E1142 Type 2 diabetes mellitus with diabetic polyneuropathy: Secondary | ICD-10-CM | POA: Diagnosis not present

## 2020-03-04 DIAGNOSIS — E662 Morbid (severe) obesity with alveolar hypoventilation: Secondary | ICD-10-CM

## 2020-03-04 DIAGNOSIS — R6889 Other general symptoms and signs: Secondary | ICD-10-CM | POA: Diagnosis not present

## 2020-03-04 DIAGNOSIS — G5603 Carpal tunnel syndrome, bilateral upper limbs: Secondary | ICD-10-CM

## 2020-03-04 DIAGNOSIS — Z0000 Encounter for general adult medical examination without abnormal findings: Secondary | ICD-10-CM

## 2020-03-04 DIAGNOSIS — G2581 Restless legs syndrome: Secondary | ICD-10-CM

## 2020-03-04 DIAGNOSIS — M1A00X0 Idiopathic chronic gout, unspecified site, without tophus (tophi): Secondary | ICD-10-CM

## 2020-03-04 DIAGNOSIS — I70209 Unspecified atherosclerosis of native arteries of extremities, unspecified extremity: Secondary | ICD-10-CM

## 2020-03-04 NOTE — Patient Instructions (Addendum)
Jason Wyatt , Thank you for taking time to come for your Medicare Wellness Visit. I appreciate your ongoing commitment to your health goals. Please review the following plan we discussed and let me know if I can assist you in the future.   These are the goals we discussed: Goals    . Exercise 150 min/wk Moderate Activity    . Fasting Blood Glucose<130    . Weight (lb) < 280 lb (127 kg)       This is a list of the screening recommended for you and due dates:  Health Maintenance  Topic Date Due  . Colon Cancer Screening  09/17/2019  . Hemoglobin A1C  05/26/2020  . Complete foot exam   11/26/2020  . Eye exam for diabetics  01/01/2021  . Tetanus Vaccine  01/16/2021  . Pneumonia vaccines (2 of 2 - PPSV23) 11/22/2021  . Flu Shot  Completed  . COVID-19 Vaccine  Completed  .  Hepatitis C: One time screening is recommended by Center for Disease Control  (CDC) for  adults born from [birth year] through 1967.   Completed      Exercising to Stay Healthy To become healthy and stay healthy, it is recommended that you do moderate-intensity and vigorous-intensity exercise. You can tell that you are exercising at a moderate intensity if your heart starts beating faster and you start breathing faster but can still hold a conversation. You can tell that you are exercising at a vigorous intensity if you are breathing much harder and faster and cannot hold a conversation while exercising. Exercising regularly is important. It has many health benefits, such as:  Improving overall fitness, flexibility, and endurance.  Increasing bone density.  Helping with weight control.  Decreasing body fat.  Increasing muscle strength.  Reducing stress and tension.  Improving overall health. How often should I exercise? Choose an activity that you enjoy, and set realistic goals. Your health care provider can help you make an activity plan that works for you. Exercise regularly as told by your health care provider.  This may include:  Doing strength training two times a week, such as: ? Lifting weights. ? Using resistance bands. ? Push-ups. ? Sit-ups. ? Yoga.  Doing a certain intensity of exercise for a given amount of time. Choose from these options: ? A total of 150 minutes of moderate-intensity exercise every week. ? A total of 75 minutes of vigorous-intensity exercise every week. ? A mix of moderate-intensity and vigorous-intensity exercise every week. Children, pregnant women, people who have not exercised regularly, people who are overweight, and older adults may need to talk with a health care provider about what activities are safe to do. If you have a medical condition, be sure to talk with your health care provider before you start a new exercise program. What are some exercise ideas? Moderate-intensity exercise ideas include:  Walking 1 mile (1.6 km) in about 15 minutes.  Biking.  Hiking.  Golfing.  Dancing.  Water aerobics. Vigorous-intensity exercise ideas include:  Walking 4.5 miles (7.2 km) or more in about 1 hour.  Jogging or running 5 miles (8 km) in about 1 hour.  Biking 10 miles (16.1 km) or more in about 1 hour.  Lap swimming.  Roller-skating or in-line skating.  Cross-country skiing.  Vigorous competitive sports, such as football, basketball, and soccer.  Jumping rope.  Aerobic dancing.   What are some everyday activities that can help me to get exercise?  [place] work, such as: ? Pushing  a Conservation officer, nature. ? Raking and bagging leaves.  Washing your car.  Pushing a stroller.  Shoveling snow.  Gardening.  Washing windows or floors. How can I be more active in my day-to-day activities?  Use stairs instead of an elevator.  Take a walk during your lunch break.  If you drive, park your car farther away from your work or school.  If you take public transportation, get off one stop early and walk the rest of the way.  Stand up or walk around during  all of your indoor phone calls.  Get up, stretch, and walk around every 30 minutes throughout the day.  Enjoy exercise with a friend. Support to continue exercising will help you keep a regular routine of activity. What guidelines can I follow while exercising?  Before you start a new exercise program, talk with your health care provider.  Do not exercise so much that you hurt yourself, feel dizzy, or get very short of breath.  Wear comfortable clothes and wear shoes with good support.  Drink plenty of water while you exercise to prevent dehydration or heat stroke.  Work out until your breathing and your heartbeat get faster. Where to find more information  U.S. Department of Health and Human Services: [employer].at  Centers for Disease Control and Prevention (CDC): http://www.wolf.info/ Summary  Exercising regularly is important. It will improve your overall fitness, flexibility, and endurance.  Regular exercise also will improve your overall health. It can help you control your weight, reduce stress, and improve your bone density.  Do not exercise so much that you hurt yourself, feel dizzy, or get very short of breath.  Before you start a new exercise program, talk with your health care provider. This information is not intended to replace advice given to you by your health care provider. Make sure you discuss any questions you have with your health care provider. Document Revised: 12/15/2016 Document Reviewed: 11/23/2016 Elsevier Patient Education  2021 [employer].

## 2020-03-06 ENCOUNTER — Other Ambulatory Visit: Payer: Self-pay | Admitting: Adult Health

## 2020-03-06 DIAGNOSIS — D509 Iron deficiency anemia, unspecified: Secondary | ICD-10-CM | POA: Insufficient documentation

## 2020-03-06 LAB — COMPLETE METABOLIC PANEL WITH GFR
AG Ratio: 1.9 (calc) (ref 1.0–2.5)
ALT: 15 U/L (ref 9–46)
AST: 16 U/L (ref 10–35)
Albumin: 4.3 g/dL (ref 3.6–5.1)
Alkaline phosphatase (APISO): 58 U/L (ref 35–144)
BUN: 15 mg/dL (ref 7–25)
CO2: 24 mmol/L (ref 20–32)
Calcium: 9.4 mg/dL (ref 8.6–10.3)
Chloride: 105 mmol/L (ref 98–110)
Creat: 0.97 mg/dL (ref 0.70–1.25)
GFR, Est African American: 94 mL/min/{1.73_m2} (ref >=60)
GFR, Est Non African American: 81 mL/min/{1.73_m2} (ref >=60)
Globulin: 2.3 g/dL (calc) (ref 1.9–3.7)
Glucose, Bld: 134 mg/dL — ABNORMAL HIGH (ref 65–99)
Potassium: 4.7 mmol/L (ref 3.5–5.3)
Sodium: 139 mmol/L (ref 135–146)
Total Bilirubin: 0.5 mg/dL (ref 0.2–1.2)
Total Protein: 6.6 g/dL (ref 6.1–8.1)

## 2020-03-06 LAB — LIPID PANEL
Cholesterol: 94 mg/dL (ref <200)
HDL: 36 mg/dL — ABNORMAL LOW (ref >=40)
LDL Cholesterol (Calc): 33 mg/dL (calc)
Non-HDL Cholesterol (Calc): 58 mg/dL (calc) (ref <130)
Total CHOL/HDL Ratio: 2.6 (calc) (ref <5.0)
Triglycerides: 170 mg/dL — ABNORMAL HIGH (ref <150)

## 2020-03-06 LAB — CBC WITH DIFFERENTIAL/PLATELET
Absolute Monocytes: 632 cells/uL (ref 200–950)
Basophils Absolute: 62 cells/uL (ref 0–200)
Basophils Relative: 0.8 %
Eosinophils Absolute: 101 cells/uL (ref 15–500)
Eosinophils Relative: 1.3 %
HCT: 37.6 % — ABNORMAL LOW (ref 38.5–50.0)
Hemoglobin: 12.9 g/dL — ABNORMAL LOW (ref 13.2–17.1)
Lymphs Abs: 1404 cells/uL (ref 850–3900)
MCH: 29.9 pg (ref 27.0–33.0)
MCHC: 34.3 g/dL (ref 32.0–36.0)
MCV: 87 fL (ref 80.0–100.0)
MPV: 9.8 fL (ref 7.5–12.5)
Monocytes Relative: 8.1 %
Neutro Abs: 5600 cells/uL (ref 1500–7800)
Neutrophils Relative %: 71.8 %
Platelets: 297 10*3/uL (ref 140–400)
RBC: 4.32 10*6/uL (ref 4.20–5.80)
RDW: 14.7 % (ref 11.0–15.0)
Total Lymphocyte: 18 %
WBC: 7.8 10*3/uL (ref 3.8–10.8)

## 2020-03-06 LAB — IRON,TIBC AND FERRITIN PANEL
%SAT: 15 % (calc) — ABNORMAL LOW (ref 20–48)
Ferritin: 8 ng/mL — ABNORMAL LOW (ref 24–380)
Iron: 63 ug/dL (ref 50–180)
TIBC: 416 mcg/dL (calc) (ref 250–425)

## 2020-03-06 LAB — HEMOGLOBIN A1C
Hgb A1c MFr Bld: 7.2 % of total Hgb — ABNORMAL HIGH (ref <5.7)
Mean Plasma Glucose: 160 mg/dL
eAG (mmol/L): 8.9 mmol/L

## 2020-03-06 LAB — TEST AUTHORIZATION

## 2020-03-06 LAB — MAGNESIUM: Magnesium: 1.7 mg/dL (ref 1.5–2.5)

## 2020-03-06 LAB — TSH: TSH: 2.23 mIU/L (ref 0.40–4.50)

## 2020-03-10 DIAGNOSIS — H2512 Age-related nuclear cataract, left eye: Secondary | ICD-10-CM | POA: Diagnosis not present

## 2020-03-10 LAB — HM DIABETES EYE EXAM

## 2020-03-11 DIAGNOSIS — H2511 Age-related nuclear cataract, right eye: Secondary | ICD-10-CM | POA: Diagnosis not present

## 2020-03-14 ENCOUNTER — Other Ambulatory Visit: Payer: Self-pay | Admitting: Adult Health

## 2020-03-16 ENCOUNTER — Other Ambulatory Visit: Payer: Self-pay

## 2020-03-16 DIAGNOSIS — D509 Iron deficiency anemia, unspecified: Secondary | ICD-10-CM

## 2020-03-16 LAB — POC HEMOCCULT BLD/STL (HOME/3-CARD/SCREEN)
Card #2 Fecal Occult Blod, POC: NEGATIVE
Card #3 Fecal Occult Blood, POC: NEGATIVE
Fecal Occult Blood, POC: NEGATIVE

## 2020-03-17 DIAGNOSIS — Z1212 Encounter for screening for malignant neoplasm of rectum: Secondary | ICD-10-CM | POA: Diagnosis not present

## 2020-03-17 DIAGNOSIS — Z1211 Encounter for screening for malignant neoplasm of colon: Secondary | ICD-10-CM | POA: Diagnosis not present

## 2020-03-22 ENCOUNTER — Ambulatory Visit (INDEPENDENT_AMBULATORY_CARE_PROVIDER_SITE_OTHER): Payer: Medicare Other | Admitting: Podiatry

## 2020-03-22 ENCOUNTER — Other Ambulatory Visit: Payer: Self-pay

## 2020-03-22 DIAGNOSIS — M2041 Other hammer toe(s) (acquired), right foot: Secondary | ICD-10-CM

## 2020-03-22 DIAGNOSIS — M2042 Other hammer toe(s) (acquired), left foot: Secondary | ICD-10-CM

## 2020-03-22 DIAGNOSIS — E1142 Type 2 diabetes mellitus with diabetic polyneuropathy: Secondary | ICD-10-CM

## 2020-03-22 NOTE — Progress Notes (Signed)
Patient presented for foam casting for 3 pair custom diabetic shoe inserts. Patient is measured with a [employer] device to be a size 14 X-wide.  Diabetic shoes are chosen from the [employer]. The shoes chosen are [id number].  The patient will be contracted when the shoes and inserts are ready to be picked up.

## 2020-03-24 DIAGNOSIS — H2511 Age-related nuclear cataract, right eye: Secondary | ICD-10-CM | POA: Diagnosis not present

## 2020-03-30 DIAGNOSIS — M4326 Fusion of spine, lumbar region: Secondary | ICD-10-CM | POA: Diagnosis not present

## 2020-03-30 DIAGNOSIS — M791 Myalgia, unspecified site: Secondary | ICD-10-CM | POA: Diagnosis not present

## 2020-04-06 ENCOUNTER — Other Ambulatory Visit: Payer: Self-pay | Admitting: Internal Medicine

## 2020-04-06 DIAGNOSIS — N401 Enlarged prostate with lower urinary tract symptoms: Secondary | ICD-10-CM

## 2020-04-06 DIAGNOSIS — N138 Other obstructive and reflux uropathy: Secondary | ICD-10-CM

## 2020-04-06 DIAGNOSIS — E1142 Type 2 diabetes mellitus with diabetic polyneuropathy: Secondary | ICD-10-CM

## 2020-04-06 MED ORDER — GABAPENTIN 600 MG PO TABS
ORAL_TABLET | ORAL | 1 refills | Status: DC
Start: 2020-04-06 — End: 2020-09-01

## 2020-04-06 MED ORDER — TAMSULOSIN HCL 0.4 MG PO CAPS: ORAL_CAPSULE | ORAL | 1 refills | Status: DC

## 2020-04-13 ENCOUNTER — Ambulatory Visit: Payer: Medicare Other | Admitting: Internal Medicine

## 2020-04-20 ENCOUNTER — Ambulatory Visit (INDEPENDENT_AMBULATORY_CARE_PROVIDER_SITE_OTHER): Payer: Medicare Other | Admitting: Adult Health Nurse Practitioner

## 2020-04-20 ENCOUNTER — Encounter: Payer: Self-pay | Admitting: Adult Health Nurse Practitioner

## 2020-04-20 ENCOUNTER — Other Ambulatory Visit: Payer: Self-pay

## 2020-04-20 DIAGNOSIS — E119 Type 2 diabetes mellitus without complications: Secondary | ICD-10-CM

## 2020-04-20 DIAGNOSIS — M2042 Other hammer toe(s) (acquired), left foot: Secondary | ICD-10-CM | POA: Diagnosis not present

## 2020-04-20 DIAGNOSIS — E1169 Type 2 diabetes mellitus with other specified complication: Secondary | ICD-10-CM

## 2020-04-20 DIAGNOSIS — M2041 Other hammer toe(s) (acquired), right foot: Secondary | ICD-10-CM

## 2020-04-20 DIAGNOSIS — E785 Hyperlipidemia, unspecified: Secondary | ICD-10-CM

## 2020-04-20 DIAGNOSIS — I10 Essential (primary) hypertension: Secondary | ICD-10-CM

## 2020-04-20 DIAGNOSIS — G4733 Obstructive sleep apnea (adult) (pediatric): Secondary | ICD-10-CM | POA: Diagnosis not present

## 2020-04-20 DIAGNOSIS — E1129 Type 2 diabetes mellitus with other diabetic kidney complication: Secondary | ICD-10-CM

## 2020-04-20 DIAGNOSIS — E1122 Type 2 diabetes mellitus with diabetic chronic kidney disease: Secondary | ICD-10-CM

## 2020-04-20 DIAGNOSIS — E662 Morbid (severe) obesity with alveolar hypoventilation: Secondary | ICD-10-CM

## 2020-04-20 DIAGNOSIS — N182 Chronic kidney disease, stage 2 (mild): Secondary | ICD-10-CM | POA: Diagnosis not present

## 2020-04-20 DIAGNOSIS — Z9989 Dependence on other enabling machines and devices: Secondary | ICD-10-CM

## 2020-04-20 DIAGNOSIS — E1142 Type 2 diabetes mellitus with diabetic polyneuropathy: Secondary | ICD-10-CM | POA: Diagnosis not present

## 2020-04-20 MED ORDER — ATORVASTATIN CALCIUM 20 MG PO TABS: 20.0000 mg | ORAL_TABLET | Freq: Every day | ORAL | 2 refills | Status: DC

## 2020-04-20 NOTE — Progress Notes (Addendum)
Diabetes Education and Follow-Up Visit  Plan and Assessment:  Diagnoses and all orders for this visit:  Comprehensive diabetic foot examination, type 2 DM, encounter for [facility])  DM type 2 with diabetic peripheral neuropathy ([place]) -     HM DIABETES FOOT EXAM  Type 2 diabetes mellitus with other diabetic kidney complication, without long-term current use of insulin (HCC) Continue medications: Metformin 554mXL two tablets twice a day. Trulicity 361msubQ weekly Discussed general issues about diabetes pathophysiology and management. Education: Reviewed 'ABCs' of diabetes management (respective goals in parentheses):  A1C (<7), blood pressure (<130/80), and cholesterol (LDL <70) Dietary recommendations Encouraged aerobic exercise.  Discussed foot care, check daily Yearly retinal exam Dental exam every 6 months Monitor blood glucose, discussed goal for patient   Hammer toes of both feet -     HM DIABETES FOOT EXAM  CKD stage 2 due to type 2 diabetes mellitus (HCC) Increase fluids  Avoid NSAIDS Blood pressure control Monitor sugars  Will continue to monitor  Essential hypertension Enalapril Monitor blood pressure at home; call if consistently over 130/80 Continue DASH diet.   Reminder to go to the ER if any CP, SOB, nausea, dizziness, severe HA, changes vision/speech, left arm numbness and tingling and jaw pain.  Hyperlipidemia associated with type 2 diabetes mellitus (HCC) Continue medications: Discussed dietary and exercise modifications Low fat diet -     atorvastatin (LIPITOR) 20 MG tablet; Take 1 tablet (20 mg total) half tablet daily. Discussed trial of stopping medication to see if myalgias resolve. Consider Zetia?   OSA on CPAP Obesity hypoventilation syndrome (HCC) CPAP recalled L:ast sleep study & equipment over 1041ears old -     Ambulatory referral to Sleep Studies   Further disposition pending results if labs check today. Discussed med's effects and SE's.    Over 30 minutes of face to face interview, exam, counseling, chart review, and critical decision making was performed.    Future Appointments  Date Time Provider De[place]4/05/2020 11:00 AM McGarnet SierrasNP [facility] None  06/03/2020  9:15 AM HyTyson Dense, DPM [facility]  06/16/2020 10:30 AM McUnk PintoMD [facility] None  11/29/2020 10:00 AM CoLiane ComberNP [facility] None  03/04/2021 11:00 AM CoLiane ComberNP [facility] None    HPI:  Jason Wyatt presents for diabetic education and foot examination. He has Diabetes Mellitus type 2:  , he is on bASA, and denies polydipsia, polyuria, visual disturbances, vomiting and weight loss.   Persistent worsening of myalgias from rosuvastatin taking it at super every other day.  CPAP has been recalled and he needs a new order for this.  Will place order today.  Last sleep study was over 10 years ago, equipment original from that time.   Last hemoglobin A1c was: Lab Results  Component Value Date   HGBA1C 7.2 (H) 03/04/2020   HGBA1C 8.2 (H) 11/27/2019   HGBA1C 8.5 (H) 08/07/2019    Pt is on a regimen of: Glucophage XR , Trulicity once a week Pt checks his sugars 2 x day   Patient does not have CKD, improved last OV He is on ACE/ARB    Lab Results  Component Value Date   GFRNONAA 81 03/04/2020    Lab Results  Component Value Date   CREATININE 0.97 03/04/2020   BUN 15 03/04/2020   NA 139 03/04/2020   K 4.7 03/04/2020   CL 105 03/04/2020   CO2 24 03/04/2020    Lab Results  Component Value  Date   MICROALBUR 22.2 11/27/2019     He is on a Statin.  He is at goal of less than 70.  Lab Results  Component Value Date   CHOL 94 03/04/2020   HDL 36 (L) 03/04/2020   LDLCALC 33 03/04/2020   TRIG 170 (H) 03/04/2020   CHOLHDL 2.6 03/04/2020     Problem List has CKD stage 2 due to type 2 diabetes mellitus ([place]); Morbid obesity ([place]); Obesity hypoventilation syndrome ([place]); T2_NIDDM w/ CKD 2 ;  Gout; Hyperlipidemia associated with type 2 diabetes mellitus ([place]); Hypertension; RLS; Medication management; DM type 2 with diabetic peripheral neuropathy ([place]); Normal coronary arteries; Primary osteoarthritis of left knee; Gait instability; Bilateral carpal tunnel syndrome; Heart rate fast; History of prostate cancer; Iron deficiency anemia; and Cubital tunnel syndrome on their problem list.  Medications Current Outpatient Medications on File Prior to Visit  Medication Sig  . acetaminophen (TYLENOL) 500 MG tablet Take 500 mg by mouth every 6 (six) hours as needed for moderate pain.  [name] acyclovir (ZOVIRAX) 400 MG tablet TAKE 1 TABLET BY MOUTH  DAILY AS NEEDED FOR COLD  SORES  . allopurinol (ZYLOPRIM) 300 MG tablet TAKE 1 TABLET BY MOUTH  DAILY TO PREVENT GOUT (Patient taking differently: TAKE 1/2 TABLET BY MOUTH  DAILY TO PREVENT GOUT)  . aspirin 81 MG tablet Take 81 mg by mouth daily.  . Blood Glucose Monitoring Suppl (ONE TOUCH ULTRA 2) w/Device KIT SMARTSIG:Via Meter  . carbidopa-levodopa (SINEMET IR) 25-250 MG tablet TAKE 1 TABLET BY MOUTH  TWICE DAILY FOR RESTLESS  LEGS  . dextromethorphan-guaiFENesin (MUCINEX DM) 30-600 MG 12hr tablet Take 1 tablet by mouth 2 (two) times daily as needed for cough.  . Dulaglutide (TRULICITY) 3 WH/6.7RF SOPN Inject 3 mg as directed once a week.  . DULoxetine (CYMBALTA) 60 MG capsule TAKE 1 CAPSULE BY MOUTH AT  BEDTIME  . enalapril (VASOTEC) 20 MG tablet TAKE 1 TABLET BY MOUTH  TWICE DAILY FOR BLOOD  PRESSURE  . gabapentin (NEURONTIN) 600 MG tablet Take  1/2 to 1 tablet  2 to 3 x /day as needed for Pain  . gatifloxacin (ZYMAXID) 0.5 % SOLN Place 1 drop into the right eye 4 (four) times daily.  . Magnesium 500 MG CAPS Take 1,500 mg by mouth every evening.   . meloxicam (MOBIC) 7.5 MG tablet Take 1 tablet Daily  with Food  is needed for Pain  . metFORMIN (GLUCOPHAGE-XR) 500 MG 24 hr tablet Take  2 tablets  2 x /day  with Meals for Diabetes  . methocarbamol  (ROBAXIN) 500 MG tablet Take 1 tablet (500 mg total) by mouth daily as needed for muscle spasms.  . Multiple Vitamin (MULTIVITAMIN) tablet Take 1 tablet by mouth daily.  . pantoprazole (PROTONIX) 40 MG tablet Take  1 tablet  Daily  to Prevent Heartburn & Indigestion  . prednisoLONE acetate (PRED FORTE) 1 % ophthalmic suspension Place 1 drop into the right eye 4 (four) times daily.  [name] PROLENSA 0.07 % SOLN Place 1 drop into the right eye at bedtime.  . sennosides-docusate sodium (SENOKOT-S) 8.6-50 MG tablet Take 1 tablet by mouth daily.   . tamsulosin (FLOMAX) 0.4 MG CAPS capsule Take  1 capsule  at Bedtime  for Prostate  . Vitamin D, Ergocalciferol, (DRISDOL) 50000 units CAPS capsule Take 1 capsule (50,000 Units total) by mouth as directed. Tuesday , Wednesday, Thursday, Sunday (Patient taking differently: Take 50,000 Units by mouth 2 (two) times a week. Wednesday and  Sunday)   No current facility-administered medications on file prior to visit.    ROS- see HPI  Diabetic Foot Exam - Simple   Simple Foot Form Diabetic Foot exam was performed with the following findings: Yes 04/20/2020 11:54 AM  Visual Inspection Sensation Testing Pulse Check Comments Sensation, not intact bilaterally.    Also see detailed examination in chart.  Physical Exam: B/P 136/89 P87 T 96.6     O2 95%  General Appearance: Well nourished, in no apparent distress. Eyes: PERRLA, EOMs, conjunctiva no swelling or erythema ENT/Mouth: Ext aud canals clear, TMs without erythema, bulging. No erythema, swelling, or exudate on post pharynx.  Tonsils not swollen or erythematous. Hearing normal.  Respiratory: Respiratory effort normal, BS equal bilaterally without rales, rhonchi, wheezing or stridor.  Cardio: RRR with no MRGs. Brisk peripheral pulses without edema.  Abdomen: Soft, + BS.  Non tender, no guarding, rebound, hernias, masses. Musculoskeletal: Full ROM, 5/5 strength, normal gait.  Skin: Warm, dry without rashes,  lesions, ecchymosis.  Left foot: lateral aspect wound, Erythema to 5th =digit blanchable, healing, scab to 3rd digit. Cracking bilateral heels. Neuro: Cranial nerves intact. Normal muscle tone, no cerebellar symptoms. Sensation not intact.    Garnet Sierras, AGNP-C, DNP [facility] 04/24/2020  4:57 PM   I agree with the above review, assessment and diagnosis.  Discussed findings with Probation officer and patient.  Provided forms signed to fill need for therapeutic shoes and inserts.   Kirtland Bouchard, MD 04/20/20

## 2020-05-18 ENCOUNTER — Other Ambulatory Visit: Payer: Self-pay | Admitting: Internal Medicine

## 2020-06-03 ENCOUNTER — Ambulatory Visit: Payer: Medicare Other | Admitting: Podiatry

## 2020-06-03 ENCOUNTER — Other Ambulatory Visit: Payer: Self-pay

## 2020-06-03 DIAGNOSIS — B351 Tinea unguium: Secondary | ICD-10-CM | POA: Diagnosis not present

## 2020-06-03 DIAGNOSIS — E1142 Type 2 diabetes mellitus with diabetic polyneuropathy: Secondary | ICD-10-CM

## 2020-06-03 DIAGNOSIS — D2372 Other benign neoplasm of skin of left lower limb, including hip: Secondary | ICD-10-CM

## 2020-06-03 DIAGNOSIS — D2371 Other benign neoplasm of skin of right lower limb, including hip: Secondary | ICD-10-CM | POA: Diagnosis not present

## 2020-06-03 DIAGNOSIS — M79676 Pain in unspecified toe(s): Secondary | ICD-10-CM | POA: Diagnosis not present

## 2020-06-06 NOTE — Progress Notes (Signed)
He presents today chief complaint of painfully elongated toenails numbness in his toes and feet also concerned about painful calluses.  Objective: Vital signs are stable he is alert oriented x3 pulses are palpable.  He has hammertoe deformities resulting in distal clavi no open lesions or wounds are noted.  He does have thick yellow dystrophic clinically mycotic nails as well as loss of protective sensation to the bilateral forefoot.  Assessment: Pain in limb secondary to onychomycosis diabetic peripheral neuropathy and benign hyperkeratotic lesions.  Plan: Discussed etiology pathology and surgical therapies debrided toenails 1 through 5 bilaterally.  Debrided all benign skin lesions.  Follow-up with him in 3 months.

## 2020-06-09 ENCOUNTER — Other Ambulatory Visit: Payer: Self-pay | Admitting: Adult Health

## 2020-06-15 ENCOUNTER — Encounter: Payer: Self-pay | Admitting: Internal Medicine

## 2020-06-15 NOTE — Patient Instructions (Signed)

## 2020-06-15 NOTE — Progress Notes (Signed)
Future Appointments  Date Time Provider [place]  06/16/2020 10:30 AM Unk Pinto, MD [facility] None  06/23/2020 10:00 AM Dohmeier, Asencion Partridge, MD [facility] None  09/09/2020  9:15 AM Tyson Dense T, DPM [facility]  11/29/2020 - CPE  10:00 AM Liane Comber, NP [facility] None  03/04/2021  - Wellness 11:00 AM Liane Comber, NP [facility] None    History of Present Illness:       This very nice 65 y.o. MWM presents for 6  month follow up with HTN, HLD, T2Niddm,  and Vitamin D Deficiency. Patient has OSA on CPAP with improved Restorative sleep. Patient has Gout controlled on his meds.       Patient is treated for HTN  (1973) & BP has been controlled at home. Today's BP is elevated at 159/90. Patient has had no complaints of any cardiac type chest pain, palpitations, dyspnea Vertell Limber /PND, dizziness, claudication, or dependent edema.       Hyperlipidemia is controlled with diet & Atorvastatin. Patient denies myalgias or other med SE's. Last Lipids were mat goal except elevated Trig's:  Lab Results  Component Value Date   CHOL 94 03/04/2020   HDL 36 (L) 03/04/2020   LDLCALC 33 03/04/2020   TRIG 170 (H) 03/04/2020   CHOLHDL 2.6 03/04/2020     Also, the patient has  Morbid Obesity (BMI 42+) and consequent T2_NIDDM (2004) w/CKD2 (GFR 81)  and on Metformin & Trulicity, patient has had no symptoms of reactive hypoglycemia, diabetic polys, paresthesias or visual blurring.  Last A1c was not at goal:  Lab Results  Component Value Date   HGBA1C 7.2 (H) 03/04/2020            Further, the patient also has history of Vitamin D Deficiency and supplements vitamin D without any suspected side-effects. Last vitamin D was at goal:  Lab Results  Component Value Date   VD25OH 98 11/27/2019     Current Outpatient Medications on File Prior to Visit  Medication Sig  . acetaminophen (TYLENOL) 500 MG tablet Take 500 mg by mouth every 6 (six) hours as needed for  moderate pain.  [name] acyclovir (ZOVIRAX) 400 MG tablet TAKE 1 TABLET BY MOUTH  DAILY AS NEEDED FOR COLD  SORES  . allopurinol (ZYLOPRIM) 300 MG tablet TAKE 1 TABLET BY MOUTH  DAILY FOR GOUT PREVENTION  . aspirin 81 MG tablet Take  daily.  [name] atorvastatin 20 MG tablet Take 1 tablet  daily.  [name] SINEMET IR 25-250 MG  TAKE 1 TABLET  TWICE DAILY FOR RESTLESS  LEGS  . MUCINEX DM 30-600 MG Take 1 tablet 2  times daily as needed for cough.  . Dulaglutide  3 MG/0.5ML S Inject 3 mg as directed once a week.  . DULoxetine  60 MG  TAKE 1 CAPSULE  AT  BEDTIME  . enalapril  20 MG tablet TAKE 1 TABLET   TWICE DAILY  . gabapentin  600 MG tablet Take  1/2 to 1 tablet  2 to 3 x /day as needed for Pain  . Magnesium 500 MG CAPS Take 1,500 mg  every evening.   . meloxicam  7.5 MG tablet Take 1 tablet Daily  with Food  is needed for Pain  . metFORMIN-XR) 500 MG  Take  2 tablets  2 x /day  with Meals  . methocarbamol  500 MG tablet Take 1 tablet  daily as needed for muscle spasms.  . Multiple Vitamin  Take  1 tablet  daily.  . pantoprazole  40 MG tablet Take  1 tablet  Daily  To Prevent  Heartburn & Indigestion  . SENOKOT-S  Take 1 tablet  daily.   . tamsulosin  0.4 MG  Take  1 capsule  at Bedtime  for Prostate  . Vitamin D  50,000 u Take 2  times a week. Wednesday and Sunday)    Allergies  Allergen Reactions  . Fenofibrate Itching and Rash  . Flaxseed Oil Rash  . Flaxseed [Linseed Oil] Rash  . Lyrica [Pregabalin] Itching and Rash  . Niaspan [Niacin Er] Rash    PMHx:   Past Medical History:  Diagnosis Date  . Acute meniscal tear of left knee   . Allergic rhinitis   . Arthritis     back  . Borderline glaucoma   . Chronic back pain    stenosis  . CKD (chronic kidney disease), stage II   . Complication of anesthesia cervical fusion surgery 06/2010   "woke up next day w/ventilator on" told due to OSA  . GERD (gastroesophageal reflux disease)    takes Prevacid daily  . Gout    takes Allopurinol daily and  Colchicine if needed  . History of adenomatous polyp of colon    tubular adenoma's  . History of Barrett's esophagus   . History of hiatal hernia   . Hyperlipidemia   . Hypertension   . Incomplete right bundle branch block (RBBB)   . OA (osteoarthritis)    knee  . OSA on CPAP   . Peripheral neuropathy   . Prostate cancer [facility]) urologist-  dr Alinda Money   dx 2013  s/p  radical non-nerve sparing prostatectomy 2014--  Stage pT2cNx,  PSA 3.48,  Gleason 3+3,  vol 55cc/  current PSA  0.01 (Aug 2016)  . Rupture of Achilles tendon   . Thyroid nodule    left side  . Type II diabetes mellitus ([place])   . Urinary frequency    takes Flomax daily  . Urine incontinence   . Wears glasses      Immunization History  Administered Date(s) Administered  . Influenza Inj Mdck Quad With Preservative 11/22/2016, 12/04/2017  . Influenza Split 10/17/2013, 10/29/2014  . Influenza, High Dose Seasonal PF 11/26/2018, 11/27/2019  . Influenza, Seasonal, Injecte, Preservative Fre 11/29/2015  . Influenza-Unspecified 11/04/2012  . PFIZER(Purple Top)SARS-COV-2 Vaccination 03/10/2019, 03/31/2019, 11/06/2019  . PPD Test 01/16/2006, 04/07/2013  . Pneumococcal Conjugate-13 07/29/2018  . Pneumococcal Polysaccharide-23 04/07/2013, 11/22/2016  . Pneumococcal-Unspecified 09/17/2010  . Td 01/17/2011  . Zoster, Live 01/16/2005     Past Surgical History:  Procedure Laterality Date  . ACHILLES TENDON SURGERY Left 06/27/2017   Procedure: ACHILLES TENDON REPAIR;  Surgeon: Evelina Bucy, DPM;  Location: [facility];  Service: Podiatry;  Laterality: Left;  . ANAL FISSURE REPAIR  06-05-2002  . BACK SURGERY    . CARDIAC CATHETERIZATION    . CARDIOVASCULAR STRESS TEST  01-01-2008   normal nuclear study/  no ischemia/  normal LV function and wall motion , 57%  . CARPAL TUNNEL RELEASE Left 03/27/2018  . CARPAL TUNNEL RELEASE Right 06/2018  . ESOPHAGOGASTRODUODENOSCOPY  09/26/2011   Procedure: ESOPHAGOGASTRODUODENOSCOPY (EGD);   Surgeon: Cleotis Nipper, MD;  Location: [facility];  Service: Endoscopy;  Laterality: N/A;  . GASTROC RECESSION EXTREMITY Left 06/27/2017   Procedure: GASTROC RECESSION EXTREMITY;  Surgeon: Evelina Bucy, DPM;  Location: [facility];  Service: Podiatry;  Laterality: Left;  . KNEE ARTHROSCOPY WITH MEDIAL MENISECTOMY  Left 12/31/2014   Procedure: LEFT KNEE ARTHROSCOPY, PARTIAL MEDIAL AND LATERAL  MENISECTOMIES WITH DEDRIDEMENT;  Surgeon: Susa Day, MD;  Location: [place];  Service: Orthopedics;  Laterality: Left;  . LEFT AND RIGHT HEART CATHETERIZATION WITH CORONARY ANGIOGRAM N/A 08/25/2011   Procedure: LEFT AND RIGHT HEART CATHETERIZATION WITH CORONARY ANGIOGRAM;  Surgeon: Peter M Martinique, MD;  Location: [facility] CATH LAB;  Service: Cardiovascular;  Laterality: N/A;   Normal coronary arteries and LVF, ef 55-60%  . POSTERIOR FUSION CERVICAL SPINE  07-05-2010   C1 --  C4  . POSTERIOR LUMBAR FUSION  05-18-2014   L4 -- S1  . ROBOT ASSISTED LAPAROSCOPIC RADICAL PROSTATECTOMY  02/07/2012   Procedure: ROBOTIC ASSISTED LAPAROSCOPIC RADICAL PROSTATECTOMY LEVEL 1;  Surgeon: Molli Hazard, MD;  Location: [facility];  Service: Urology;  Laterality: N/A;     . TENDON TRANSFER Left 06/27/2017   Procedure: TENDON TRANSFER;  Surgeon: Evelina Bucy, DPM;  Location: [facility];  Service: Podiatry;  Laterality: Left;  . TOTAL KNEE ARTHROPLASTY Left 08/05/2015   Procedure: LEFT TOTAL KNEE ARTHROPLASTY;  Surgeon: Susa Day, MD;  Location: [facility];  Service: Orthopedics;  Laterality: Left;  . TRANSTHORACIC ECHOCARDIOGRAM  08-03-2011   mild LVH, ef 55-60%/  trivial TR and PR  . UVULOPALATOPHARYNGOPLASTY (UPPP)/TONSILLECTOMY/SEPTOPLASTY  1990's   and Adenoidectomy  . wisdom teeth extracted   1982    FHx:    Reviewed / unchanged  SHx:    Reviewed / unchanged   Systems Review:  Constitutional: Denies fever, chills, wt changes, headaches, insomnia, fatigue, night sweats, change in  appetite. Eyes: Denies redness, blurred vision, diplopia, discharge, itchy, watery eyes.  ENT: Denies discharge, congestion, post nasal drip, epistaxis, sore throat, earache, hearing loss, dental pain, tinnitus, vertigo, sinus pain, snoring.  CV: Denies chest pain, palpitations, irregular heartbeat, syncope, dyspnea, diaphoresis, orthopnea, PND, claudication or edema. Respiratory: denies cough, dyspnea, DOE, pleurisy, hoarseness, laryngitis, wheezing.  Gastrointestinal: Denies dysphagia, odynophagia, heartburn, reflux, water brash, abdominal pain or cramps, nausea, vomiting, bloating, diarrhea, constipation, hematemesis, melena, hematochezia  or hemorrhoids. Genitourinary: Denies dysuria, frequency, urgency, nocturia, hesitancy, discharge, hematuria or flank pain. Musculoskeletal: Denies arthralgias, myalgias, stiffness, jt. swelling, pain, limping or strain/sprain.  Skin: Denies pruritus, rash, hives, warts, acne, eczema or change in skin lesion(s). Neuro: No weakness, tremor, incoordination, spasms, paresthesia or pain. Psychiatric: Denies confusion, memory loss or sensory loss. Endo: Denies change in weight, skin or hair change.  Heme/Lymph: No excessive bleeding, bruising or enlarged lymph nodes.  Physical Exam  BP (!) 159/90 (BP Location: Right Arm, Patient Position: Sitting, Cuff Size: Large)   Pulse (!) 102   Temp 97.7 F (36.5 C)   Resp 18   Ht 6\' 2"  (1.88 m)   Wt (!) 310 lb (140.6 kg)   SpO2 96%   BMI 39.80 kg/m   Appears  well nourished, well groomed  and in no distress.  Eyes: PERRLA, EOMs, conjunctiva no swelling or erythema. Sinuses: No frontal/maxillary tenderness ENT/Mouth: EAC's clear, TM's nl w/o erythema, bulging. Nares clear w/o erythema, swelling, exudates. Oropharynx clear without erythema or exudates. Oral hygiene is good. Tongue normal, non obstructing. Hearing intact.  Neck: Supple. Thyroid not palpable. Car 2+/2+ without bruits, nodes or JVD. Chest:  Respirations nl with BS clear & equal w/o rales, rhonchi, wheezing or stridor.  Cor: Heart sounds normal w/ regular rate and rhythm without sig. murmurs, gallops, clicks or rubs. Peripheral pulses normal and equal  without edema.  Abdomen: Soft & bowel sounds  normal. Non-tender w/o guarding, rebound, hernias, masses or organomegaly.  Lymphatics: Unremarkable.  Musculoskeletal: Full ROM all peripheral extremities, joint stability, 5/5 strength and normal gait. Bilat hammertoe deformities and Onychomycotic toenails.  Skin: Warm, dry without exposed rashes, lesions or ecchymosis apparent.  Neuro: Cranial nerves intact, reflexes equal bilaterally. Sensory decreased in distal feet/toes to monofilament. Tendon reflexes grossly intact.  Pysch: Alert & oriented x 3.  Insight and judgement nl & appropriate. No ideations.  Assessment and Plan:  1. Essential hypertension  - Continue medication, monitor blood pressure at home.  - Continue DASH diet.  Reminder to go to the ER if any CP,  SOB, nausea, dizziness, severe HA, changes vision/speech.  - CBC with Differential/Platelet - COMPLETE METABOLIC PANEL WITH GFR - Magnesium - TSH  2. Hyperlipidemia associated with type 2 diabetes mellitus ([place])  - Continue diet/meds, exercise,& lifestyle modifications.  - Continue monitor periodic cholesterol/liver & renal functions   - Lipid panel - TSH  3. Type 2 diabetes mellitus with stage 2 chronic kidney  disease, without long-term current use of insulin ([facility])  - Continue diet, exercise  - Lifestyle modifications.  - Monitor appropriate labs  - Hemoglobin A1c - Insulin, random  4. Vitamin D deficiency  - Continue supplementation.   - VITAMIN D 25 Hydroxy   5. Idiopathic chronic gout   - Uric acid  6. Hammer toes of both feet   7. OSA on CPAP   8. Medication management  - CBC with Differential/Platelet - COMPLETE METABOLIC PANEL WITH GFR - Magnesium - Lipid panel - TSH -  Hemoglobin A1c - Insulin, random - VITAMIN D 25 Hydroxy - Uric acid      Discussed  regular exercise, BP monitoring, weight control to achieve/maintain BMI less than 25 and discussed med and SE's. Recommended labs to assess and monitor clinical status with further disposition pending results of labs.  I discussed the assessment and treatment plan with the patient. The patient was provided an opportunity to ask questions and all were answered. The patient agreed with the plan and demonstrated an understanding of the instructions.  I provided over 30 minutes of exam, counseling, chart review and  complex critical decision making.       The patient was advised to call back or seek an in-person evaluation if the symptoms worsen or if the condition fails to improve as anticipated.   Kirtland Bouchard, MD

## 2020-06-16 ENCOUNTER — Encounter: Payer: Self-pay | Admitting: Internal Medicine

## 2020-06-16 ENCOUNTER — Ambulatory Visit (INDEPENDENT_AMBULATORY_CARE_PROVIDER_SITE_OTHER): Payer: Medicare Other | Admitting: Internal Medicine

## 2020-06-16 ENCOUNTER — Other Ambulatory Visit: Payer: Self-pay

## 2020-06-16 VITALS — BP 159/90 | HR 102 | Temp 97.7°F | Resp 18 | Ht 74.0 in | Wt 310.0 lb

## 2020-06-16 DIAGNOSIS — E1122 Type 2 diabetes mellitus with diabetic chronic kidney disease: Secondary | ICD-10-CM | POA: Diagnosis not present

## 2020-06-16 DIAGNOSIS — E559 Vitamin D deficiency, unspecified: Secondary | ICD-10-CM | POA: Diagnosis not present

## 2020-06-16 DIAGNOSIS — I10 Essential (primary) hypertension: Secondary | ICD-10-CM

## 2020-06-16 DIAGNOSIS — E785 Hyperlipidemia, unspecified: Secondary | ICD-10-CM

## 2020-06-16 DIAGNOSIS — N182 Chronic kidney disease, stage 2 (mild): Secondary | ICD-10-CM

## 2020-06-16 DIAGNOSIS — E1169 Type 2 diabetes mellitus with other specified complication: Secondary | ICD-10-CM | POA: Diagnosis not present

## 2020-06-16 DIAGNOSIS — M1A00X0 Idiopathic chronic gout, unspecified site, without tophus (tophi): Secondary | ICD-10-CM

## 2020-06-16 DIAGNOSIS — M2041 Other hammer toe(s) (acquired), right foot: Secondary | ICD-10-CM

## 2020-06-16 DIAGNOSIS — G4733 Obstructive sleep apnea (adult) (pediatric): Secondary | ICD-10-CM

## 2020-06-16 DIAGNOSIS — Z79899 Other long term (current) drug therapy: Secondary | ICD-10-CM

## 2020-06-16 DIAGNOSIS — M2042 Other hammer toe(s) (acquired), left foot: Secondary | ICD-10-CM

## 2020-06-17 LAB — COMPLETE METABOLIC PANEL WITH GFR
AG Ratio: 1.8 (calc) (ref 1.0–2.5)
ALT: 17 U/L (ref 9–46)
AST: 17 U/L (ref 10–35)
Albumin: 4.5 g/dL (ref 3.6–5.1)
Alkaline phosphatase (APISO): 64 U/L (ref 35–144)
BUN: 15 mg/dL (ref 7–25)
CO2: 22 mmol/L (ref 20–32)
Calcium: 10 mg/dL (ref 8.6–10.3)
Chloride: 104 mmol/L (ref 98–110)
Creat: 0.86 mg/dL (ref 0.70–1.25)
GFR, Est African American: 104 mL/min/{1.73_m2} (ref >=60)
GFR, Est Non African American: 90 mL/min/{1.73_m2} (ref >=60)
Globulin: 2.5 g/dL (calc) (ref 1.9–3.7)
Glucose, Bld: 137 mg/dL — ABNORMAL HIGH (ref 65–99)
Potassium: 4.8 mmol/L (ref 3.5–5.3)
Sodium: 139 mmol/L (ref 135–146)
Total Bilirubin: 0.5 mg/dL (ref 0.2–1.2)
Total Protein: 7 g/dL (ref 6.1–8.1)

## 2020-06-17 LAB — LIPID PANEL
Cholesterol: 96 mg/dL (ref <200)
HDL: 37 mg/dL — ABNORMAL LOW (ref >=40)
LDL Cholesterol (Calc): 32 mg/dL (calc)
Non-HDL Cholesterol (Calc): 59 mg/dL (calc) (ref <130)
Total CHOL/HDL Ratio: 2.6 (calc) (ref <5.0)
Triglycerides: 206 mg/dL — ABNORMAL HIGH (ref <150)

## 2020-06-17 LAB — URIC ACID: Uric Acid, Serum: 5.6 mg/dL (ref 4.0–8.0)

## 2020-06-17 LAB — MAGNESIUM: Magnesium: 1.7 mg/dL (ref 1.5–2.5)

## 2020-06-17 LAB — CBC WITH DIFFERENTIAL/PLATELET
Absolute Monocytes: 554 cells/uL (ref 200–950)
Basophils Absolute: 70 cells/uL (ref 0–200)
Basophils Relative: 0.9 %
Eosinophils Absolute: 94 cells/uL (ref 15–500)
Eosinophils Relative: 1.2 %
HCT: 41.4 % (ref 38.5–50.0)
Hemoglobin: 13.8 g/dL (ref 13.2–17.1)
Lymphs Abs: 1357 cells/uL (ref 850–3900)
MCH: 29.6 pg (ref 27.0–33.0)
MCHC: 33.3 g/dL (ref 32.0–36.0)
MCV: 88.7 fL (ref 80.0–100.0)
MPV: 9.8 fL (ref 7.5–12.5)
Monocytes Relative: 7.1 %
Neutro Abs: 5725 cells/uL (ref 1500–7800)
Neutrophils Relative %: 73.4 %
Platelets: 289 10*3/uL (ref 140–400)
RBC: 4.67 10*6/uL (ref 4.20–5.80)
RDW: 15.2 % — ABNORMAL HIGH (ref 11.0–15.0)
Total Lymphocyte: 17.4 %
WBC: 7.8 10*3/uL (ref 3.8–10.8)

## 2020-06-17 LAB — TSH: TSH: 2.45 mIU/L (ref 0.40–4.50)

## 2020-06-17 LAB — VITAMIN D 25 HYDROXY (VIT D DEFICIENCY, FRACTURES): Vit D, 25-Hydroxy: 90 ng/mL (ref 30–100)

## 2020-06-17 LAB — HEMOGLOBIN A1C
Hgb A1c MFr Bld: 7.3 % of total Hgb — ABNORMAL HIGH (ref <5.7)
Mean Plasma Glucose: 163 mg/dL
eAG (mmol/L): 9 mmol/L

## 2020-06-17 LAB — INSULIN, RANDOM: Insulin: 26.2 u[IU]/mL — ABNORMAL HIGH

## 2020-06-17 NOTE — Progress Notes (Signed)
============================================================ -   Test results slightly outside the reference range are not unusual. If there is anything important, I will review this with you,  otherwise it is considered normal test values.  If you have further questions,  please do not hesitate to contact me at the office or via My Chart.  ============================================================ ============================================================  -  Kidney functions  Normal & OK - Great  ============================================================ ============================================================  -   Magnesium  -   1.7   -  very  low- goal is betw 2.0 - 2.5,   - So...............  Please be SURE that you're taking   Magnesium 500 mg tablet 3 tablets /day = 1,500 mg /day as recommended  !   - also important to eat lots of  leafy green vegetables   - spinach - Kale - collards - greens - okra - asparagus  - broccoli - quinoa - squash - almonds   - black, red, white beans  -  peas - green beans ============================================================ ============================================================  -  Total Chol = 96  & LDL Chol = 32 -  Both  Excellent  !   - Very low risk for Heart Attack  / Stroke ============================================================  - Suggest you cut your Lipitor  /Atorvastatin 20 mg dose in 1/2 to = 10 mg /day ! ============================================================ ============================================================  -  But Triglycerides (   206   ) or fats in blood are too high  (goal is less than 150)    - Recommend avoid fried & greasy foods,  sweets / candy,   - Avoid white rice  (brown or wild rice or Quinoa is OK),   - Avoid white potatoes  (sweet potatoes are OK)   - Avoid anything made from white flour  - bagels, doughnuts, rolls, buns, biscuits, white and   wheat breads,  pizza crust and traditional  pasta made of white flour & egg white  - (vegetarian pasta or spinach or wheat pasta is OK).    - Multi-grain bread is OK - like multi-grain flat bread or  sandwich thins.   - Avoid alcohol in excess.   - Exercise is also important. ============================================================ ============================================================  -  A1c = 7.3% - Still Way too High !  (Bad ! ) -   - Please work harder on your diet & Weight loss and please be sure  that   you're not missing doses of your Metformin   ============================================================ ============================================================  -  Vitamin D = 90 - Great - Please keep dose same ============================================================ ============================================================  -  Uric Acid / Gout test - Normal & OK - So please keep dose Allopurinol same   ============================================================ ============================================================  -  All Else - CBC - Kidneys - Electrolytes - Liver - Magnesium & Thyroid    - all  Normal / OK ============================================================ ============================================================

## 2020-06-22 ENCOUNTER — Encounter: Payer: Self-pay | Admitting: Neurology

## 2020-06-23 ENCOUNTER — Ambulatory Visit: Payer: Medicare Other | Admitting: Neurology

## 2020-06-23 ENCOUNTER — Encounter: Payer: Self-pay | Admitting: Neurology

## 2020-06-23 VITALS — BP 159/88 | HR 83 | Ht 74.0 in | Wt 314.0 lb

## 2020-06-23 DIAGNOSIS — Z9989 Dependence on other enabling machines and devices: Secondary | ICD-10-CM | POA: Diagnosis not present

## 2020-06-23 DIAGNOSIS — Z9889 Other specified postprocedural states: Secondary | ICD-10-CM

## 2020-06-23 DIAGNOSIS — G4709 Other insomnia: Secondary | ICD-10-CM | POA: Diagnosis not present

## 2020-06-23 DIAGNOSIS — E662 Morbid (severe) obesity with alveolar hypoventilation: Secondary | ICD-10-CM | POA: Diagnosis not present

## 2020-06-23 NOTE — Addendum Note (Signed)
Addended by: Larey Seat on: 06/23/2020 11:24 AM   Modules accepted: Orders

## 2020-06-23 NOTE — Progress Notes (Signed)
SLEEP MEDICINE CLINIC    Provider:  Larey Seat, MD  Primary Care Physician:  Unk Pinto, MD [address] [place] [address]     Referring Provider: Unk Pinto, [address] [place] [address],  [place] [address]          Chief Complaint according to patient   Patient presents with:    . New Patient (Initial Visit)     Patient here for a new CPAP, his is 65 years old, has a chip card, and is recalled. He was treated for OSA wuth obesity hypoventilation.       HISTORY OF PRESENT ILLNESS:  06-23-2020 Jason Wyatt is a 65 y.o.  Caucasian male patient seen here upon PCP  referral on 06/23/2020  for a new evaluation of apnea and therapy needs. Chief concern according to patient : " my machine is on recall, I cant sleep in a sleep lab and want my test at home" He is considered CPAP dependent, can't sleep without.    I have the pleasure of seeing Jason Wyatt today, a right-handed White or Caucasian male with OHV/ OSA disorder , CPAP dependent. He  has a past medical history of Acute meniscal tear of left knee, Allergic rhinitis, Arthritis, Borderline glaucoma, Chronic back pain, CKD (chronic kidney disease), stage II, Complication of anesthesia (cervical fusion surgery 06/2010), GERD (gastroesophageal reflux disease), Gout, History of adenomatous polyp of colon, History of Barrett's esophagus, History of hiatal hernia, Hyperlipidemia, Hypertension, Incomplete right bundle branch block (RBBB), OA (osteoarthritis), OSA on CPAP, Peripheral diabetic neuropathy, Prostate cancer ([place]) (urologist-  dr Alinda Money), Rupture of Achilles tendon, Thyroid nodule, Type II diabetes mellitus ([place]) for decades with retinal disease, Urinary frequency and incontinence, and Obesity. He had a UPPP trying to replace  CPAP.    The patient had the first sleep study in the year 2007 (?) with a result of an AHI , at [facility], and a second sleep study at [place]- 2009.  Machine came form [facility].  His current CPAP was downloaded here on 06-23-2020, AHI is residual at  3.9/h. set to 5 through 20 cm water , factory settings, 3 cm flex, 100% compliance.     Family medical /sleep history: No other family member on CPAP with OSA, insomnia, sleep walkers.  Sister is oxygen dependent, with DM and CHF, cardiac valve disease, neuropathy, severe edema, and adrenal tumor.    Social history:  Patient is retired from [employer]" and lives in a household with his spouse, they have 2 adult sons, no grandchildren.The patient currently keeps the house. He no longer drives due to his neuropathy and sensorimotor impairment of both feet.  Tobacco use: none .  ETOH use ; none , Caffeine intake in form of  Tea ( all day) . Regular exercise in form of walking the dog.    Sleep habits are as follows: The patient's dinner time is between 5-6 PM. The patient goes to bed at 10.30 PM and continues to sleep for 5-7 hours, wakes for one bathroom break, the first time at 3 AM. Dreams are reportedly frequent/vivid, sometimes it will wake him.   Around 7  AM is the usual rise time. The patient wakes up spontaneously.  He reports most mornings feeling refreshed /restored in AM, with symptoms such as stiiffness, aches and pain, dry mouth, seldom morning headaches and residual fatigue.  Naps are taken frequently in front of the TV , lasting from 15-30  minutes and are more  refreshing than nocturnal sleep, giving energy for another 2 hours.    Review of Systems: Out of a complete 14 system review, the patient complains of only the following symptoms, and all other reviewed systems are negative.:  Fatigue, sleepiness , snoring, when not on CPAP.    How likely are you to doze in the following situations: 0 = not likely, 1 = slight chance, 2 = moderate chance, 3 = high chance   Sitting and Reading? Watching Television? Sitting inactive in a public place (theater or meeting)? As a passenger in a car  for an hour without a break? Lying down in the afternoon when circumstances permit? Sitting and talking to someone? Sitting quietly after lunch without alcohol? In a car, while stopped for a few minutes in traffic?   Total = 10/ 24 points   FSS endorsed at 37/ 63 points.    CPAP dependent.   Social History   Socioeconomic History  . Marital status: Married    Spouse name: Not on file  . Number of children: 2  . Years of education: Not on file  . Highest education level: Some college, no degree  Occupational History  . Not on file  Tobacco Use  . Smoking status: Never Smoker  . Smokeless tobacco: Never Used  Vaping Use  . Vaping Use: Never used  Substance and Sexual Activity  . Alcohol use: Never  . Drug use: Never  . Sexual activity: Not on file  Other Topics Concern  . Not on file  Social History Narrative   Lives at home with wife   Caffeine: never   Social Determinants of Radio broadcast assistant Strain: Not on file  Food Insecurity: Not on file  Transportation Needs: Not on file  Physical Activity: Not on file  Stress: Not on file  Social Connections: Not on file    Family History  Problem Relation Age of Onset  . Hypertension Mother   . Dementia Mother   . Osteoporosis Mother   . Osteoarthritis Mother   . Cancer Father   . Hyperlipidemia Sister   . Hypertension Sister   . Stroke Maternal Grandmother   . Heart attack Maternal Grandmother   . Hypertension Paternal Grandfather   . Sleep apnea Son   . Sleep apnea Son     Past Medical History:  Diagnosis Date  . Acute meniscal tear of left knee   . Allergic rhinitis   . Arthritis     back  . Borderline glaucoma   . Chronic back pain    stenosis  . CKD (chronic kidney disease), stage II   . Complication of anesthesia cervical fusion surgery 06/2010   "woke up next day w/ventilator on" told due to OSA  . GERD (gastroesophageal reflux disease)    takes Prevacid daily  . Gout    takes  Allopurinol daily and Colchicine if needed  . History of adenomatous polyp of colon    tubular adenoma's  . History of Barrett's esophagus   . History of hiatal hernia   . Hyperlipidemia   . Hypertension   . Incomplete right bundle branch block (RBBB)   . OA (osteoarthritis)    knee  . OSA on CPAP   . Peripheral neuropathy   . Prostate cancer [facility]) urologist-  dr Alinda Money   dx 2013  s/p  radical non-nerve sparing prostatectomy 2014--  Stage pT2cNx,  PSA 3.48,  Gleason 3+3,  vol 55cc/  current PSA  0.01 (Aug 2016)  . Rupture of Achilles tendon   . Thyroid nodule    left side  . Type II diabetes mellitus ([place])   . Urinary frequency    takes Flomax daily  . Urine incontinence   . Wears glasses     Past Surgical History:  Procedure Laterality Date  . ACHILLES TENDON SURGERY Left 06/27/2017   Procedure: ACHILLES TENDON REPAIR;  Surgeon: Evelina Bucy, DPM;  Location: [facility];  Service: Podiatry;  Laterality: Left;  . ANAL FISSURE REPAIR  06-05-2002  . BACK SURGERY    . CARDIAC CATHETERIZATION    . CARDIOVASCULAR STRESS TEST  01-01-2008   normal nuclear study/  no ischemia/  normal LV function and wall motion , 57%  . CARPAL TUNNEL RELEASE Left 03/27/2018  . CARPAL TUNNEL RELEASE Right 06/2018  . ESOPHAGOGASTRODUODENOSCOPY  09/26/2011   Procedure: ESOPHAGOGASTRODUODENOSCOPY (EGD);  Surgeon: Cleotis Nipper, MD;  Location: [facility];  Service: Endoscopy;  Laterality: N/A;  . GASTROC RECESSION EXTREMITY Left 06/27/2017   Procedure: GASTROC RECESSION EXTREMITY;  Surgeon: Evelina Bucy, DPM;  Location: [facility];  Service: Podiatry;  Laterality: Left;  . KNEE ARTHROSCOPY WITH MEDIAL MENISECTOMY Left 12/31/2014   Procedure: LEFT KNEE ARTHROSCOPY, PARTIAL MEDIAL AND LATERAL  MENISECTOMIES WITH [place];  Surgeon: Susa Day, MD;  Location: [facility];  Service: Orthopedics;  Laterality: Left;  . LEFT AND RIGHT HEART CATHETERIZATION WITH CORONARY ANGIOGRAM N/A  08/25/2011   Procedure: LEFT AND RIGHT HEART CATHETERIZATION WITH CORONARY ANGIOGRAM;  Surgeon: Peter M Martinique, MD;  Location: [facility] CATH LAB;  Service: Cardiovascular;  Laterality: N/A;   Normal coronary arteries and LVF, ef 55-60%  . POSTERIOR FUSION CERVICAL SPINE  07-05-2010   C1 --  C4  . POSTERIOR LUMBAR FUSION  05-18-2014   L4 -- S1  . ROBOT ASSISTED LAPAROSCOPIC RADICAL PROSTATECTOMY  02/07/2012   Procedure: ROBOTIC ASSISTED LAPAROSCOPIC RADICAL PROSTATECTOMY LEVEL 1;  Surgeon: Molli Hazard, MD;  Location: [facility];  Service: Urology;  Laterality: N/A;     . TENDON TRANSFER Left 06/27/2017   Procedure: TENDON TRANSFER;  Surgeon: Evelina Bucy, DPM;  Location: [facility];  Service: Podiatry;  Laterality: Left;  . TOTAL KNEE ARTHROPLASTY Left 08/05/2015   Procedure: LEFT TOTAL KNEE ARTHROPLASTY;  Surgeon: Susa Day, MD;  Location: [facility];  Service: Orthopedics;  Laterality: Left;  . TRANSTHORACIC ECHOCARDIOGRAM  08-03-2011   mild LVH, ef 55-60%/  trivial TR and PR  . UVULOPALATOPHARYNGOPLASTY (UPPP)/TONSILLECTOMY/SEPTOPLASTY  1990's   and Adenoidectomy  . wisdom teeth extracted   1982     Current Outpatient Medications on File Prior to Visit  Medication Sig Dispense Refill  . acetaminophen (TYLENOL) 500 MG tablet Take 500 mg by mouth every 6 (six) hours as needed for moderate pain.    [name] acyclovir (ZOVIRAX) 400 MG tablet TAKE 1 TABLET BY MOUTH  DAILY AS NEEDED FOR COLD  SORES 90 tablet 3  . allopurinol (ZYLOPRIM) 300 MG tablet TAKE 1 TABLET BY MOUTH  DAILY FOR GOUT PREVENTION 90 tablet 3  . aspirin 81 MG tablet Take 81 mg by mouth daily.    [name] atorvastatin (LIPITOR) 20 MG tablet Take 1 tablet (20 mg total) by mouth daily. 90 tablet 2  . Blood Glucose Monitoring Suppl (ONE TOUCH ULTRA 2) w/Device KIT SMARTSIG:Via Meter    . carbidopa-levodopa (SINEMET IR) 25-250 MG tablet TAKE 1 TABLET BY MOUTH  TWICE DAILY FOR RESTLESS  LEGS 180 tablet  3  . dextromethorphan-guaiFENesin (MUCINEX DM)  30-600 MG 12hr tablet Take 1 tablet by mouth 2 (two) times daily as needed for cough.    . Dulaglutide (TRULICITY) 3 GD/9.2EQ SOPN Inject 3 mg as directed once a week. 2 mL 2  . DULoxetine (CYMBALTA) 60 MG capsule TAKE 1 CAPSULE BY MOUTH AT  BEDTIME 90 capsule 3  . enalapril (VASOTEC) 20 MG tablet TAKE 1 TABLET BY MOUTH  TWICE DAILY FOR BLOOD  PRESSURE 180 tablet 3  . gabapentin (NEURONTIN) 600 MG tablet Take  1/2 to 1 tablet  2 to 3 x /day as needed for Pain 270 tablet 1  . Magnesium 500 MG CAPS Take 1,500 mg by mouth every evening.     . meloxicam (MOBIC) 7.5 MG tablet Take 1 tablet Daily  with Food  is needed for Pain 90 tablet 0  . metFORMIN (GLUCOPHAGE-XR) 500 MG 24 hr tablet Take  2 tablets  2 x /day  with Meals for Diabetes 360 tablet 3  . methocarbamol (ROBAXIN) 500 MG tablet Take 1 tablet (500 mg total) by mouth daily as needed for muscle spasms. 90 tablet 0  . Multiple Vitamin (MULTIVITAMIN) tablet Take 1 tablet by mouth daily.    . pantoprazole (PROTONIX) 40 MG tablet Take  1 tablet  Daily  To Prevent  Heartburn & Indigestion 90 tablet 3  . sennosides-docusate sodium (SENOKOT-S) 8.6-50 MG tablet Take 1 tablet by mouth daily.     . tamsulosin (FLOMAX) 0.4 MG CAPS capsule Take  1 capsule  at Bedtime  for Prostate 90 capsule 1  . Vitamin D, Ergocalciferol, (DRISDOL) 50000 units CAPS capsule Take 1 capsule (50,000 Units total) by mouth as directed. Tuesday , Wednesday, Thursday, Sunday (Patient taking differently: Take 50,000 Units by mouth 2 (two) times a week. Wednesday and Sunday) 90 capsule 1   No current facility-administered medications on file prior to visit.    Allergies  Allergen Reactions  . Fenofibrate Itching and Rash  . Flaxseed Oil Rash  . Flaxseed [Linseed Oil] Rash  . Lyrica [Pregabalin] Itching and Rash  . Niaspan [Niacin Er] Rash    Physical exam:  There were no vitals filed for this visit. There is no height or weight on file to calculate BMI.   Wt Readings  from Last 3 Encounters:  06/16/20 (!) 310 lb (140.6 kg)  03/04/20 (!) 311 lb (141.1 kg)  11/27/19 (!) 314 lb 12.8 oz (142.8 kg)     Ht Readings from Last 3 Encounters:  06/16/20 '6\' 2"'  (1.88 m)  11/27/19 '6\' 1"'  (1.854 m)  04/03/19 6' 1.5" (1.867 m)      General: The patient is awake, alert and appears not in acute distress.  The patient is well groomed. Head: Normocephalic, atraumatic. Neck is supple. Mallampati STATUS  POST UPPP., scalloped  Tongue.  neck circumference:22 inches . Nasal airflow is  patent.   Retrognathia is  seen.  Dental status:  Cardiovascular:  Regular rate and cardiac rhythm by pulse,  without distended neck veins. Respiratory: Lungs are clear to auscultation.  Skin:  Without evidence of ankle edema, or rash. Trunk: The patient's posture is erect.   Neurologic exam : The patient is awake and alert, oriented to place and time.   Memory subjective described as intact.  Attention span & concentration ability appears normal.  Speech is fluent,  without  dysarthria, dysphonia but some word finding delays-aphasia.  Mood and affect are appropriate.   Cranial nerves: no loss of  smell or taste reported  Pupils are equal and briskly reactive to light.  Funduscopic exam ; deferred  Status post cataract .  Extraocular movements in vertical and horizontal planes were intact and without nystagmus. No Diplopia. Visual fields by finger perimetry are intact. Hearing was impaired  to soft voice and finger rubbing.    Facial sensation intact to fine touch.  Facial motor strength is symmetric and tongue was midline.  Neck ROM : rotation, tilt and flexion extension were normal for age and shoulder shrug was symmetrical.    Motor exam:  Symmetric bulk, tone and ROM.  There is loss of foot dorsiflexion and plantarflexion.   Normal tone without cog wheeling, symmetric grip strength - weak ness of grip ! He has a pronator drift on the right. .   Sensory:  Fine touch, pinprick  and vibration were absent in both legs below knee, Proprioception tested in the upper extremities was normal. Numbness has ascended to the hands, all 10 fingers. He has had carpal tunnel repair on the right.    Coordination: Rapid alternating movements in the fingers/hands were of normal speed. He has a tremor affecting his handwriting, has numbness in his fingers.  The Finger-to-nose maneuver was intact without evidence of ataxia, dysmetria or tremor.   Gait and station: Patient could rise unassisted from a seated position, walked without assistive device.  Stance is of normal width/ base and the patient turned with 3 steps.  Toe and heel walk were deferred.  Deep tendon reflexes: in the  upper and lower extremities are absent - trace in upper extremities only- Babinski response was deferred.       After spending a total time of 50 minutes face to face and additional time for physical and neurologic examination, review of laboratory studies,  personal review of imaging studies, reports and results of other testing and review of referral information / records as far as provided in visit, I have established the following assessments:  1)  CPAP dependent - has been for over 15 years, has a [employer] machine on recall- prescribed in 2009?  Based on a presumed diagnosis of OHV and OSA, hypoventilation.  2)  Morbid obesity, large neck, UPPP has not let to open airway. Macroglossia, retrognathia.  3) machine on recall, and not fully functional.    My Plan is to proceed with:  1) I prefer an in lab study SPLIT, patient wants a HST. I will let insurance make the decision If he can't sleep without CPAP, I want him to get it to be able to sleep in the lab.  2) He needs a new machinen ASAP.  3) weight loss should be recommended, low carb, higher protein,   I would like to thank Unk Pinto, MD [address] [place],  [place] [address] for allowing me to meet with and to take care of  this pleasant patient.   I plan to follow up either personally or through our NP within 3 month.    Electronically signed by: Larey Seat, MD 06/23/2020 10:29 AM  [facility] Board certified by The [employer] of Sleep Medicine and Diplomate of the [employer] of Sleep Medicine. Board certified In Neurology through the [place], Fellow of the [employer] of Neurology. Medical Director of [facility].

## 2020-06-23 NOTE — Patient Instructions (Signed)
[email or web address]">  Obesity-Hypoventilation Syndrome Obesity-hypoventilation syndrome (OHS) is a condition in which a person cannot efficiently move air in and out of the lungs (ventilate). This condition causes hypoventilation, which means the blood will have a buildup of carbon dioxideand a drop in oxygen levels. Key factors for OHS include having too much body fat, or obesity, and having high levels of awake daytime carbon dioxide (hypercapnia). OHS can increase the risk for:  Cor pulmonale, or right-sided heart failure.  Congestive heart failure.  Pulmonary hypertension, or high blood pressure in the arteries in the lungs.  Too many red blood cells in the body.  Disability or death. What are the causes? This condition may be caused by:  Being obese with a BMI (body mass index) greater than or equal to 30 kg/m2.  Having too much fat around the abdomen, chest, and neck.  The brain being unable to properly manage the high carbon dioxide and low oxygen levels.  Hormones made by fat cells. These hormones may interfere with breathing.  Sleep apnea. This is when breathing stops, pauses, or is shallow during sleep. What are the signs or symptoms? Symptoms of this condition include:  Feeling sleepy during the day.  Headaches. These may be worse in the morning.  Shortness of breath.  Snoring, choking, gasping, or trouble breathing during sleep.  Poor concentration or poor memory.  Mood changes or feeling irritable.  Depression. How is this diagnosed? This condition may be diagnosed by:  BMI measurement.  Blood tests to measure blood levels of serum bicarbonate, carbon dioxide, and oxygen.  Pulse oximetry to measure the amount of oxygen in your blood. This uses a small device that is placed on your finger, earlobe, or toe.  Polysomnogram, or sleep study, to check your breathing patterns and  levels of oxygen and carbon dioxide while you sleep. You may also have other tests, including:  A chest X-ray to rule out other breathing problems.  Lung tests, or pulmonary function tests, to rule out other breathing problems.  ECG (electrocardiogram) or echocardiogram to check for signs of heart failure. How is this treated? This condition may be treated with:  A device such as a continuous positive airway pressure (CPAP) machine or a bi-level positive airway pressure (BPAP) machine. These devices deliver pressure and sometimes oxygen to make breathing easier. A mask may be placed over your nose or mouth.  Oxygen if your blood oxygen levels are low.  A weight-loss program.  Bariatric, or weight-loss, surgery.  Tracheostomy. A tube is placed in the windpipe through the neck to help with breathing.   Follow these instructions at home: Medicines  Take over-the-counter and prescription medicines only as told by your health care provider.  Ask your health care provider what medicines are safe for you. You may be told to avoid medicines such as sedatives and narcotics. These can affect breathing and make OHS worse. Sleeping habits  If you are prescribed a CPAP or a BPAP machine, make sure you understand how to use it. Use your CPAP or BPAP machine only as told by your health care provider.  Try to get at least 8 hours of sleep every night. Eating and drinking  Eat foods that are high in fiber, such as beans, whole grains, and fresh fruits and vegetables.  Limit foods that are high in fat and processed sugars, such as fried or sweet foods.  Drink enough fluid to keep your urine pale yellow.  Do not drink alcohol if: ? Your  health care provider tells you not to drink. ? You are pregnant, may be pregnant, or are planning to become pregnant.   General instructions  Follow a diet and exercise plan that helps you reach and keep a healthy weight as told by your health care  provider.  Exercise regularly as told by your health care provider.  Do not use any products that contain nicotine or tobacco, such as cigarettes, e-cigarettes, and chewing tobacco. If you need help quitting, ask your health care provider.  Keep all follow-up visits as told by your health care provider. This is important.   Contact a health care provider if:  You develop new or worsening shortness of breath.  You are having trouble waking up or staying awake.  You are confused.  You have chest pain.  You have fast or irregular heartbeats.  You are dizzy or you faint.  You develop a cough.  You have a fever. Get help right away if: You have any symptoms of a stroke. "BE FAST" is an easy way to remember the main warning signs of a stroke:  B - Balance. Signs are dizziness, sudden trouble walking, or loss of balance.  E - Eyes. Signs are trouble seeing or a sudden change in vision.  F - Face. Signs are sudden weakness or numbness of the face, or the face or eyelid drooping on one side.  A - Arms. Signs are weakness or numbness in an arm. This happens suddenly and usually on one side of the body.  S - Speech. Signs are sudden trouble speaking, slurred speech, or trouble understanding what people say.  T - Time. Time to call emergency services. Write down what time symptoms started. You have other signs of a stroke, such as:  A sudden, severe headache with no known cause.  Nausea or vomiting.  Seizure. These symptoms may represent a serious problem that is an emergency. Do not wait to see if the symptoms will go away. Get medical help right away. Call your local emergency services (911 in the [place] Do not drive yourself to the hospital. If you ever feel like you may hurt yourself or others, or have thoughts about taking your own life, get help right away. Go to your nearest emergency department or:  Call your local emergency services (911 in the [place]  Call a suicide  crisis helpline, such as the [facility] at (614)517-3208. This is open 24 hours a day in the [place]  Text the Crisis Text Line at 909 429 2593 (in the [place].). Summary  Obesity-hypoventilation syndrome (OHS) causes hypoventilation, which means the blood will have a buildup of carbon dioxideand a drop in oxygen levels.  Key factors for OHS include having too much body fat, or obesity, and having high levels of awake daytime carbon dioxide (hypercapnia).  OHS can increase the risk for heart failure, pulmonary hypertension, disability, and death.  Follow your diet and exercise plan as told by your health care provider. This information is not intended to replace advice given to you by your health care provider. Make sure you discuss any questions you have with your health care provider. Document Revised: 12/27/2018 Document Reviewed: 12/27/2018 Elsevier Patient Education  2021 [place]. CPAP and BPAP Information CPAP and BPAP are methods that use air pressure to keep your airways open and to help you breathe well. CPAP and BPAP use different amounts of pressure. Your health care provider will tell you whether CPAP or BPAP would be more  helpful for you.  CPAP stands for "continuous positive airway pressure." With CPAP, the amount of pressure stays the same while you breathe in and out.  BPAP stands for "bi-level positive airway pressure." With BPAP, the amount of pressure will be higher when you breathe in (inhale) and lower when you breathe out(exhale). This allows you to take larger breaths. CPAP or BPAP may be used in the hospital, or your health care provider may want you to use it at home. You may need to have a sleep study before your health care provider can order a machine for you to use at home. Why are CPAP and BPAP treatments used? CPAP or BPAP can be helpful if you have:  Sleep apnea.  Chronic obstructive pulmonary disease (COPD).  Heart failure.  Medical  conditions that cause muscle weakness, including muscular dystrophy or amyotrophic lateral sclerosis (ALS).  Other problems that cause breathing to be shallow, weak, abnormal, or difficult. CPAP and BPAP are most commonly used for obstructive sleep apnea (OSA) to keep the airways from collapsing when the muscles relax during sleep. How is CPAP or BPAP administered? Both CPAP and BPAP are provided by a small machine with a flexible plastic tube that attaches to a plastic mask that you wear. Air is blown through the mask into your nose or mouth. The amount of pressure that is used to blow the air can be adjusted on the machine. Your health care provider will set the pressure setting and help you find the best mask for you. When should CPAP or BPAP be used? In most cases, the mask only needs to be worn during sleep. Generally, the mask needs to be worn throughout the night and during any daytime naps. People with certain medical conditions may also need to wear the mask at other times when they are awake. Follow instructions from your health care provider about when to use the machine. What are some tips for using the mask?  Because the mask needs to be snug, some people feel trapped or closed-in (claustrophobic) when first using the mask. If you feel this way, you may need to get used to the mask. One way to do this is to hold the mask loosely over your nose or mouth and then gradually apply the mask more snugly. You can also gradually increase the amount of time that you use the mask.  Masks are available in various types and sizes. If your mask does not fit well, talk with your health care provider about getting a different one. Some common types of masks include: ? Full face masks, which fit over the mouth and nose. ? Nasal masks, which fit over the nose. ? Nasal pillow or prong masks, which fit into the nostrils.  If you are using a mask that fits over your nose and you tend to breathe through  your mouth, a chin strap may be applied to help keep your mouth closed.  Some CPAP and BPAP machines have alarms that may sound if the mask comes off or develops a leak.  If you have trouble with the mask, it is very important that you talk with your health care provider about finding a way to make the mask easier to tolerate. Do not stop using the mask. There could be a negative impact to your health if you stop using the mask.   What are some tips for using the machine?  Place your CPAP or BPAP machine on a secure table or stand  near an electrical outlet.  Know where the on/off switch is on the machine.  Follow instructions from your health care provider about how to set the pressure on your machine and when you should use it.  Do not eat or drink while the CPAP or BPAP machine is on. Food or fluids could get pushed into your lungs by the pressure of the CPAP or BPAP.  For home use, CPAP and BPAP machines can be rented or purchased through home health care companies. Many different brands of machines are available. Renting a machine before purchasing may help you find out which particular machine works well for you. Your insurance may also decide which machine you may get.  Keep the CPAP or BPAP machine and attachments clean. Ask your health care provider for specific instructions. Follow these instructions at home:  Do not use any products that contain nicotine or tobacco, such as cigarettes, e-cigarettes, and chewing tobacco. If you need help quitting, ask your health care provider.  Keep all follow-up visits as told by your health care provider. This is important. Contact a health care provider if:  You have redness or pressure sores on your head, face, mouth, or nose from the mask or head gear.  You have trouble using the CPAP or BPAP machine.  You cannot tolerate wearing the CPAP or BPAP mask.  Someone tells you that you snore even when wearing your CPAP or BPAP. Get help right  away if:  You have trouble breathing.  You feel confused. Summary  CPAP and BPAP are methods that use air pressure to keep your airways open and to help you breathe well.  You may need to have a sleep study before your health care provider can order a machine for home use.  If you have trouble with the mask, it is very important that you talk with your health care provider about finding a way to make the mask easier to tolerate. Do not stop using the mask. There could be a negative impact to your health if you stop using the mask.  Follow instructions from your health care provider about when to use the machine. This information is not intended to replace advice given to you by your health care provider. Make sure you discuss any questions you have with your health care provider. Document Revised: 01/24/2019 Document Reviewed: 01/27/2019 Elsevier Patient Education  2021 [employer].

## 2020-06-28 ENCOUNTER — Telehealth: Payer: Self-pay | Admitting: Podiatry

## 2020-06-28 NOTE — Telephone Encounter (Signed)
Diabetic shoes/inserts in..lvm for pt to call to schedule an appt to pick them up. 

## 2020-06-30 ENCOUNTER — Other Ambulatory Visit: Payer: Self-pay

## 2020-06-30 ENCOUNTER — Ambulatory Visit: Payer: Medicare Other

## 2020-06-30 DIAGNOSIS — E1142 Type 2 diabetes mellitus with diabetic polyneuropathy: Secondary | ICD-10-CM | POA: Diagnosis not present

## 2020-06-30 DIAGNOSIS — G894 Chronic pain syndrome: Secondary | ICD-10-CM | POA: Diagnosis not present

## 2020-06-30 DIAGNOSIS — M4326 Fusion of spine, lumbar region: Secondary | ICD-10-CM | POA: Diagnosis not present

## 2020-06-30 DIAGNOSIS — M791 Myalgia, unspecified site: Secondary | ICD-10-CM | POA: Diagnosis not present

## 2020-06-30 DIAGNOSIS — M2042 Other hammer toe(s) (acquired), left foot: Secondary | ICD-10-CM | POA: Diagnosis not present

## 2020-06-30 DIAGNOSIS — M2041 Other hammer toe(s) (acquired), right foot: Secondary | ICD-10-CM | POA: Diagnosis not present

## 2020-06-30 NOTE — Progress Notes (Signed)
Patient presents picking up Diabetic shoes and custom inserts.  Patient tried on shoes with inserts reported it fitted a little snug with the insert in. He was informed of the delivery and break-in instructions. Paperwork was signed.  Shoe Size 14 XW

## 2020-07-07 ENCOUNTER — Telehealth: Payer: Self-pay

## 2020-07-07 ENCOUNTER — Other Ambulatory Visit: Payer: Self-pay | Admitting: Internal Medicine

## 2020-07-07 DIAGNOSIS — N182 Chronic kidney disease, stage 2 (mild): Secondary | ICD-10-CM

## 2020-07-07 DIAGNOSIS — E1122 Type 2 diabetes mellitus with diabetic chronic kidney disease: Secondary | ICD-10-CM

## 2020-07-07 DIAGNOSIS — I10 Essential (primary) hypertension: Secondary | ICD-10-CM

## 2020-07-07 MED ORDER — BISOPROLOL FUMARATE 10 MG PO TABS: ORAL_TABLET | ORAL | 1 refills | Status: DC

## 2020-07-07 MED ORDER — OLMESARTAN MEDOXOMIL 40 MG PO TABS: ORAL_TABLET | ORAL | 1 refills | Status: DC

## 2020-07-07 NOTE — Telephone Encounter (Signed)
Patient is inquiring about the status of the blood pressure medication that was supposed to be sent to [facility]. At his previous appointment with Dr. Melford Aase, BP was elevated and was told to start taking Losartan. Provider aware and advised patient to check his BP at home BID for a week and let us know what his numbers are. And then will add on new BP med as needed.

## 2020-07-07 NOTE — Telephone Encounter (Signed)
Spoke with patient again and he states that his blood pressure at home for the past several weeks has been as high as 202/116.  Provider aware and advised patient to stop taking enalapril and to start taking the two new BP meds and to monitor his BP. Call with any questions or concerns.

## 2020-07-14 ENCOUNTER — Ambulatory Visit (INDEPENDENT_AMBULATORY_CARE_PROVIDER_SITE_OTHER): Payer: Medicare Other | Admitting: Neurology

## 2020-07-14 DIAGNOSIS — G4733 Obstructive sleep apnea (adult) (pediatric): Secondary | ICD-10-CM | POA: Diagnosis not present

## 2020-07-14 DIAGNOSIS — Z9889 Other specified postprocedural states: Secondary | ICD-10-CM

## 2020-07-14 DIAGNOSIS — E662 Morbid (severe) obesity with alveolar hypoventilation: Secondary | ICD-10-CM

## 2020-07-14 DIAGNOSIS — G4709 Other insomnia: Secondary | ICD-10-CM

## 2020-07-14 DIAGNOSIS — Z9989 Dependence on other enabling machines and devices: Secondary | ICD-10-CM

## 2020-07-16 NOTE — Addendum Note (Signed)
Addended by: Larey Seat on: 07/16/2020 01:50 PM   Modules accepted: Orders

## 2020-07-16 NOTE — Procedures (Signed)
[facility] TEST REPORT ( by Watch PAT)   STUDY DATE:  07-14-2020 DOB:  1955/04/01 MRN: [id number]   ORDERING CLINICIAN: Loletha Grayer Tavares Levinson, MD  REFERRING CLINICIAN:    CLINICAL INFORMATION/HISTORY:  CPAP dependent OSA and Obesity hypoventilation patient needing new machine, this HST is meant to establish a baseline. He has retrognathia and had a UPPP.  Current CPAP is 67 years old.   Referred by Dr. Melford Aase, MD    Epworth sleepiness score: 10/24. FSS at 37/63 points while on CPAP.    140 kg/ 185 cm     Sleep Summary:   Total Recording Time (hours, min):  2 hours and 52 minutes      Total Sleep Time (hours, min):        1h 58 min         Percent REM (%):      inconclusive                                  Respiratory Indices:   Calculated pAHI (per hour): 58.3/h                            REM pAHI:      N/A                                           NREM pAHI:      N/A                        Supine AHI:     N/A                                              Oxygen Saturation Statistics:   Oxygen Saturation (%) Mean:     93          Minimum oxygen saturation (%):   80         O2 Saturation Range (%):    89-100                                   O2 Saturation (minutes) <89%:   6.1 minutes        Pulse Rate Statistics:   Pulse Mean (bpm):     66            Pulse Range:       51-93 bpm          IMPRESSION:  This HST confirms the presence of severe sleep apnea and the patient's dependency on CPAP therapy. As soon as sleep was initiated the AHI rose through the range of severe. The recording time was very limited as was the sleep time, but we would use these data in an in-lab study to initiate a SPLIT protocol.    RECOMMENDATION: The patient will need urgently a new CPAP device, set between 6 and 18 cm water with 3 cm EPR and a mask of his choosing. Heated humidification will be provided.     INTERPRETING PHYSICIAN: Larey Seat,  MD  Diplomate  of the ABSM and the AAN/ ABPN.  Medical Director of [facility] [employer].

## 2020-07-16 NOTE — Progress Notes (Signed)
IMPRESSION:  This HST confirms the presence of severe sleep apnea and the patient's dependency on CPAP therapy. As soon as sleep was initiated the AHI rose through the range of severe. The recording time was very limited as was the sleep time, but we would use these data in an in-lab study to initiate a SPLIT protocol.   RECOMMENDATION: The patient will need urgently a new CPAP device, set between 6 and 18 cm water with 3 cm EPR and a mask of his choosing. Heated humidification will be provided.    INTERPRETING PHYSICIAN: Larey Seat, MD

## 2020-07-21 ENCOUNTER — Other Ambulatory Visit: Payer: Self-pay | Admitting: Internal Medicine

## 2020-07-21 ENCOUNTER — Telehealth: Payer: Self-pay | Admitting: Neurology

## 2020-07-21 NOTE — Telephone Encounter (Signed)
I called pt. I advised pt that Dr. Brett Fairy reviewed their sleep study results and found that pt has severe OSA. Dr. Brett Fairy recommends that pt start auto CPAP. I reviewed PAP compliance expectations with the pt. Pt is agreeable to starting a CPAP. I advised pt that an order will be sent to a DME, [facility] and [facility] ([facility] will call the pt within about one week after they file with the pt's insurance. [facility]) will show the pt how to use the machine, fit for masks, and troubleshoot the CPAP if needed. A follow up appt was made for insurance purposes with Dr. Brett Fairy on 10/18/2020 at 8:30 am. Pt verbalized understanding to arrive 15 minutes early and bring their CPAP. A letter with all of this information in it will be mailed to the pt as a reminder. I verified with the pt that the address we have on file is correct. Pt verbalized understanding of results. Pt had no questions at this time but was encouraged to call back if questions arise. I have sent the order to [facility])  and have received confirmation that they have received the order.

## 2020-07-21 NOTE — Telephone Encounter (Signed)
-----   Message from Larey Seat, MD sent at 07/16/2020  1:49 PM EDT ----- IMPRESSION:  This HST confirms the presence of severe sleep apnea and the patient's dependency on CPAP therapy. As soon as sleep was initiated the AHI rose through the range of severe. The recording time was very limited as was the sleep time, but we would use these data in an in-lab study to initiate a SPLIT protocol.   RECOMMENDATION: The patient will need urgently a new CPAP device, set between 6 and 18 cm water with 3 cm EPR and a mask of his choosing. Heated humidification will be provided.    INTERPRETING PHYSICIAN: Larey Seat, MD

## 2020-07-21 NOTE — Telephone Encounter (Signed)
Called patient to discuss sleep study results. No answer at this time. LVM for the patient to call back.   

## 2020-07-21 NOTE — Progress Notes (Signed)
Acute visit for Hypertension   Assessment and Plan:  Jason Wyatt was seen today for follow-up.  Diagnoses and all orders for this visit:  Resistant hypertension -     CBC with Differential/Platelet -     EKG 12-Lead -     VAS US RENAL ARTERY DUPLEX; Future - Meds recently switched to Zebeta 10 mg QAM and Benicar 40 mg QPM.  His home BP's are running 153-190/93-144.  Pts current home BP cuff is >67 years old. Will have pt get a new BP cuff and continue to monitor .  Follow up in 1 month for recheck.  -     Pt given information on DASH diet  Type 2 diabetes mellitus with stage 2 chronic kidney disease, without long-term current use of insulin (HCC) -     COMPLETE METABOLIC PANEL WITH GFR -     EKG 12-Lead -     VAS US RENAL ARTERY DUPLEX; Future Last a1c 7.3 IN 03/2669 on Tulicity and Metfromin, continue current medications, stressed importance of diet and given instructions on dietary changes for diabetes.   Hyperlipidemia associated with type 2 diabetes mellitus (HCC) -     COMPLETE METABOLIC PANEL WITH GFR -     Lipid panel -     EKG 12-Lead - Continue current medications and given diet instructions to avoid saturated fats and alcohol, increase fiber and fruits and vegetables  DM type 2 with diabetic peripheral neuropathy (HCC)  Continue current medications and checking blood sugars, information is given on diet and exercise   Shortness of breath -     EKG 12-Lead   OSA on CPA       - Pt had new Sleep  study and is in the process of getting a new CPAP machine  Further disposition pending results of labs. Discussed med's effects and SE's.   Over 45 minutes of exam, counseling, chart review, and critical decision making was performed.   Future Appointments  Date Time Provider [place]  09/09/2020  9:15 AM [place], [place] T, [place] [facility]  10/18/2020  8:30 AM Doh[place], Asencion Partridge, MD [facility] None  11/29/2020 10:00 AM Liane Comber, NP [facility] None  03/04/2021 11:00  AM Liane Comber, NP [facility] None    ------------------------------------------------------------------------------------------------------------------   HPI BP (!) 142/74 (BP Location: Right Arm, Patient Position: Sitting, Cuff Size: Large)   Pulse 67   Temp (!) 97.5 F (36.4 C)   Resp 17   Ht '6\' 2"'  (1.88 m)   Wt (!) 314 lb (142.4 kg)   SpO2 97%   BMI 40.32 kg/m   65 y.o.male with Hypertension, medication was switched on 07/07/20 from enalapril to zebeta and Benicar. His home BP's are running 153-190/93-144.  Pts current home BP cuff is >43 years old. He has also been noticing an increase in shortness of breath with minimal activity.  Denies orthopnea and chest pain.  No edema of extremities noticed.  BMI is Body mass index is 40.32 kg/m., he has been working on diet and exercise. He reports has noted reduced appetite, some days will do only very light supper He has been reducing potato intake, smaller portions On home scale has noted down from 314 lb to 310 lb Denies any notable SE since starting medication Wt Readings from Last 3 Encounters:  07/22/20 (!) 314 lb (142.4 kg)  06/23/20 (!) 314 lb (142.4 kg)  06/16/20 (!) 310 lb (140.6 kg)      BP Readings from Last 3 Encounters:  07/22/20 (!) 142/74  06/23/20 (!) 159/88  06/16/20 (!) 159/90     Blood sugars 026-378 fasting on trulicity and Metformin. Last A1C was not at goal Lab Results  Component Value Date   HGBA1C 7.3 (H) 06/16/2020    Past Medical History:  Diagnosis Date   Acute meniscal tear of left knee    Allergic rhinitis    Arthritis     back   Borderline glaucoma    Chronic back pain    stenosis   CKD (chronic kidney disease), stage II    Complication of anesthesia cervical fusion surgery 06/2010   "woke up next day w/ventilator on" told due to OSA   GERD (gastroesophageal reflux disease)    takes Prevacid daily   Gout    takes Allopurinol daily and Colchicine if needed   History of adenomatous  polyp of colon    tubular adenoma's   History of Barrett's esophagus    History of hiatal hernia    Hyperlipidemia    Hypertension    Incomplete right bundle branch block (RBBB)    OA (osteoarthritis)    knee   OSA on CPAP    Peripheral neuropathy    Prostate cancer [facility]) urologist-  dr Alinda Money   dx 2013  s/p  radical non-nerve sparing prostatectomy 2014--  Stage pT2cNx,  PSA 3.48,  Gleason 3+3,  vol 55cc/  current PSA  0.01 (Aug 2016)   Rupture of Achilles tendon    Thyroid nodule    left side   Type II diabetes mellitus ([place])    Urinary frequency    takes Flomax daily   Urine incontinence    Wears glasses      Allergies  Allergen Reactions   Fenofibrate Itching and Rash   Flaxseed Oil Rash   Flaxseed [Linseed Oil] Rash   Lyrica [Pregabalin] Itching and Rash   Niaspan [Niacin Er] Rash    Current Outpatient Medications on File Prior to Visit  Medication Sig   acetaminophen (TYLENOL) 500 MG tablet Take 500 mg by mouth every 6 (six) hours as needed for moderate pain.   acyclovir (ZOVIRAX) 400 MG tablet TAKE 1 TABLET BY MOUTH  DAILY AS NEEDED FOR COLD  SORES   allopurinol (ZYLOPRIM) 300 MG tablet TAKE 1 TABLET BY MOUTH  DAILY FOR GOUT PREVENTION   aspirin 81 MG tablet Take 81 mg by mouth daily.   atorvastatin (LIPITOR) 20 MG tablet Take 1 tablet (20 mg total) by mouth daily.   bisoprolol (ZEBETA) 10 MG tablet Take  1 tablet  every Morning  for BP   Blood Glucose Monitoring Suppl (ONE TOUCH ULTRA 2) w/Device KIT SMARTSIG:Via Meter   carbidopa-levodopa (SINEMET IR) 25-250 MG tablet TAKE 1 TABLET BY MOUTH  TWICE DAILY FOR RESTLESS  LEGS   dextromethorphan-guaiFENesin (MUCINEX DM) 30-600 MG 12hr tablet Take 1 tablet by mouth 2 (two) times daily as needed for cough.   Dulaglutide (TRULICITY) 3 HY/8.5OY SOPN Inject 3 mg as directed once a week.   DULoxetine (CYMBALTA) 60 MG capsule TAKE 1 CAPSULE BY MOUTH AT  BEDTIME   gabapentin (NEURONTIN) 600 MG tablet Take  1/2 to 1 tablet  2  to 3 x /day as needed for Pain   Magnesium 500 MG CAPS Take 1,500 mg by mouth every evening.    meloxicam (MOBIC) 7.5 MG tablet Take 1 tablet Daily  with Food  is needed for Pain   metFORMIN (GLUCOPHAGE-XR) 500 MG 24 hr tablet Take  2 tablets  2 x /day  with Meals for Diabetes   methocarbamol (ROBAXIN) 500 MG tablet Take 1 tablet (500 mg total) by mouth daily as needed for muscle spasms.   Multiple Vitamin (MULTIVITAMIN) tablet Take 1 tablet by mouth daily.   olmesartan (BENICAR) 40 MG tablet Take  1 tablet  every Night  for BP & Diabetic Kidney Protection   pantoprazole (PROTONIX) 40 MG tablet Take  1 tablet  Daily  To Prevent  Heartburn & Indigestion   sennosides-docusate sodium (SENOKOT-S) 8.6-50 MG tablet Take 1 tablet by mouth daily.    tamsulosin (FLOMAX) 0.4 MG CAPS capsule Take  1 capsule  at Bedtime  for Prostate   Vitamin D, Ergocalciferol, (DRISDOL) 50000 units CAPS capsule Take 1 capsule (50,000 Units total) by mouth as directed. Tuesday , Wednesday, Thursday, Sunday (Patient taking differently: Take 50,000 Units by mouth 2 (two) times a week. Wednesday and Sunday)   No current facility-administered medications on file prior to visit.   Review of Systems  Constitutional:  Positive for malaise/fatigue. Negative for chills and fever.  HENT:  Positive for congestion and sinus pain. Negative for hearing loss and tinnitus.   Eyes:  Negative for blurred vision and double vision.  Respiratory:  Positive for shortness of breath (with activity). Negative for cough and wheezing.   Cardiovascular:  Negative for chest pain, palpitations, orthopnea and leg swelling.  Gastrointestinal:  Positive for constipation (relief with senokot) and heartburn. Negative for diarrhea, nausea and vomiting.  Genitourinary: Negative.   Musculoskeletal:  Positive for back pain, joint pain and neck pain. Negative for falls.  Skin:  Negative for rash.  Neurological:  Positive for tingling (legs and fingers).  Negative for dizziness and headaches.  Psychiatric/Behavioral:  Negative for depression. The patient does not have insomnia.     Physical Exam:  BP (!) 142/74 (BP Location: Right Arm, Patient Position: Sitting, Cuff Size: Large)   Pulse 67   Temp (!) 97.5 F (36.4 C)   Resp 17   Ht '6\' 2"'  (1.88 m)   Wt (!) 314 lb (142.4 kg)   SpO2 97%   BMI 40.32 kg/m   General Appearance: Obese , in no apparent distress. Eyes: PERRLA, conjunctiva no swelling or erythema ENT/Mouth: Ext aud canals clear, TMs without erythema, bulging. No erythema, swelling, or exudate on post pharynx.  Tonsils not swollen or erythematous. Hearing normal. Neck: Supple, thyroid normal.  Respiratory: Respiratory effort normal, BS equal bilaterally without rales, rhonchi, wheezing or stridor.  Cardio: RRR with no MRGs. Brisk peripheral pulses without edema.  Abdomen: Soft, obese abdomen, + BS.  Non tender, no guarding, rebound, hernias, masses. Lymphatics: Non tender without lymphadenopathy.  Musculoskeletal: Full ROM, 5/5 strength, normal gait.  Skin: Warm, dry without rashes, lesions, ecchymosis.  Neuro:  Normal muscle tone, no cerebellar symptoms. Sensation intact.  Psych: Awake and oriented X 3, normal affect, Insight and Judgment appropriate.     Magda Bernheim, NP 10:11 AM [facility]

## 2020-07-21 NOTE — Telephone Encounter (Signed)
Pt returned call to [facility]. Please call.

## 2020-07-22 ENCOUNTER — Encounter: Payer: Self-pay | Admitting: Nurse Practitioner

## 2020-07-22 ENCOUNTER — Ambulatory Visit (INDEPENDENT_AMBULATORY_CARE_PROVIDER_SITE_OTHER): Payer: Medicare Other | Admitting: Nurse Practitioner

## 2020-07-22 ENCOUNTER — Other Ambulatory Visit: Payer: Self-pay

## 2020-07-22 VITALS — BP 142/74 | HR 67 | Temp 97.5°F | Resp 17 | Ht 74.0 in | Wt 314.0 lb

## 2020-07-22 DIAGNOSIS — R0602 Shortness of breath: Secondary | ICD-10-CM

## 2020-07-22 DIAGNOSIS — Z9989 Dependence on other enabling machines and devices: Secondary | ICD-10-CM

## 2020-07-22 DIAGNOSIS — E785 Hyperlipidemia, unspecified: Secondary | ICD-10-CM | POA: Diagnosis not present

## 2020-07-22 DIAGNOSIS — G4733 Obstructive sleep apnea (adult) (pediatric): Secondary | ICD-10-CM | POA: Diagnosis not present

## 2020-07-22 DIAGNOSIS — E1142 Type 2 diabetes mellitus with diabetic polyneuropathy: Secondary | ICD-10-CM

## 2020-07-22 DIAGNOSIS — E1169 Type 2 diabetes mellitus with other specified complication: Secondary | ICD-10-CM

## 2020-07-22 DIAGNOSIS — N182 Chronic kidney disease, stage 2 (mild): Secondary | ICD-10-CM | POA: Diagnosis not present

## 2020-07-22 DIAGNOSIS — I1A0 Resistant hypertension: Secondary | ICD-10-CM

## 2020-07-22 DIAGNOSIS — E1122 Type 2 diabetes mellitus with diabetic chronic kidney disease: Secondary | ICD-10-CM

## 2020-07-22 DIAGNOSIS — I10 Essential (primary) hypertension: Secondary | ICD-10-CM

## 2020-07-22 LAB — CBC WITH DIFFERENTIAL/PLATELET
Absolute Monocytes: 649 cells/uL (ref 200–950)
Basophils Absolute: 66 cells/uL (ref 0–200)
Basophils Relative: 0.7 %
Eosinophils Absolute: 113 cells/uL (ref 15–500)
Eosinophils Relative: 1.2 %
HCT: 39.1 % (ref 38.5–50.0)
Hemoglobin: 13.1 g/dL — ABNORMAL LOW (ref 13.2–17.1)
Lymphs Abs: 1438 cells/uL (ref 850–3900)
MCH: 29.7 pg (ref 27.0–33.0)
MCHC: 33.5 g/dL (ref 32.0–36.0)
MCV: 88.7 fL (ref 80.0–100.0)
MPV: 9.6 fL (ref 7.5–12.5)
Monocytes Relative: 6.9 %
Neutro Abs: 7135 cells/uL (ref 1500–7800)
Neutrophils Relative %: 75.9 %
Platelets: 257 10*3/uL (ref 140–400)
RBC: 4.41 10*6/uL (ref 4.20–5.80)
RDW: 14.8 % (ref 11.0–15.0)
Total Lymphocyte: 15.3 %
WBC: 9.4 10*3/uL (ref 3.8–10.8)

## 2020-07-22 LAB — LIPID PANEL
Cholesterol: 103 mg/dL (ref <200)
HDL: 42 mg/dL (ref >=40)
LDL Cholesterol (Calc): 36 mg/dL (calc)
Non-HDL Cholesterol (Calc): 61 mg/dL (calc) (ref <130)
Total CHOL/HDL Ratio: 2.5 (calc) (ref <5.0)
Triglycerides: 176 mg/dL — ABNORMAL HIGH (ref <150)

## 2020-07-22 LAB — COMPLETE METABOLIC PANEL WITH GFR
AG Ratio: 1.9 (calc) (ref 1.0–2.5)
ALT: 9 U/L (ref 9–46)
AST: 13 U/L (ref 10–35)
Albumin: 4.4 g/dL (ref 3.6–5.1)
Alkaline phosphatase (APISO): 65 U/L (ref 35–144)
BUN: 16 mg/dL (ref 7–25)
CO2: 27 mmol/L (ref 20–32)
Calcium: 9.6 mg/dL (ref 8.6–10.3)
Chloride: 102 mmol/L (ref 98–110)
Creat: 0.97 mg/dL (ref 0.70–1.25)
GFR, Est African American: 93 mL/min/{1.73_m2} (ref >=60)
GFR, Est Non African American: 80 mL/min/{1.73_m2} (ref >=60)
Globulin: 2.3 g/dL (calc) (ref 1.9–3.7)
Glucose, Bld: 162 mg/dL — ABNORMAL HIGH (ref 65–99)
Potassium: 4.8 mmol/L (ref 3.5–5.3)
Sodium: 138 mmol/L (ref 135–146)
Total Bilirubin: 0.6 mg/dL (ref 0.2–1.2)
Total Protein: 6.7 g/dL (ref 6.1–8.1)

## 2020-07-22 NOTE — Patient Instructions (Addendum)
Please obtain a new Blood pressure cuff to monitor BP's at home. Will be scheduling a renal ultrasound to evaluate renal arteries.  The Facility will be reaching out to you to schedule.    Diabetes Mellitus and Nutrition, Adult When you have diabetes, or diabetes mellitus, it is very important to have healthy eating habits because your blood sugar (glucose) levels are greatly affected by what you eat and drink. Eating healthy foods in the right amounts, at about the same times every day, can help you: Control your blood glucose. Lower your risk of heart disease. Improve your blood pressure. Reach or maintain a healthy weight. What can affect my meal plan? Every person with diabetes is different, and each person has different needs for a meal plan. Your health care provider may recommend that you work with a dietitian to make a meal plan that is best for you. Your meal plan may vary depending on factors such as: The calories you need. The medicines you take. Your weight. Your blood glucose, blood pressure, and cholesterol levels. Your activity level. Other health conditions you have, such as heart or kidney disease. How do carbohydrates affect me? Carbohydrates, also called carbs, affect your blood glucose level more than any other type of food. Eating carbs naturally raises the amount of glucose in your blood. Carb counting is a method for keeping track of how many carbs you eat. Counting carbs is important to keep your blood glucose at a healthy level,especially if you use insulin or take certain oral diabetes medicines. It is important to know how many carbs you can safely have in each meal. This is different for every person. Your dietitian can help you calculate how manycarbs you should have at each meal and for each snack. How does alcohol affect me? Alcohol can cause a sudden decrease in blood glucose (hypoglycemia), especially if you use insulin or take certain oral diabetes medicines.  Hypoglycemia can be a life-threatening condition. Symptoms of hypoglycemia, such as sleepiness, dizziness, and confusion, are similar to symptoms of having too much alcohol. Do not drink alcohol if: Your health care provider tells you not to drink. You are pregnant, may be pregnant, or are planning to become pregnant. If you drink alcohol: Do not drink on an empty stomach. Limit how much you use to: 0-1 drink a day for women. 0-2 drinks a day for men. Be aware of how much alcohol is in your drink. In the [place] one drink equals one 12 oz bottle of beer (355 mL), one 5 oz glass of wine (148 mL), or one 1 oz glass of hard liquor (44 mL). Keep yourself hydrated with water, diet soda, or unsweetened iced tea. Keep in mind that regular soda, juice, and other mixers may contain a lot of sugar and must be counted as carbs. What are tips for following this plan?  Reading food labels Start by checking the serving size on the "Nutrition Facts" label of packaged foods and drinks. The amount of calories, carbs, fats, and other nutrients listed on the label is based on one serving of the item. Many items contain more than one serving per package. Check the total grams (g) of carbs in one serving. You can calculate the number of servings of carbs in one serving by dividing the total carbs by 15. For example, if a food has 30 g of total carbs per serving, it would be equal to 2 servings of carbs. Check the number of grams (g) of saturated  fats and trans fats in one serving. Choose foods that have a low amount or none of these fats. Check the number of milligrams (mg) of salt (sodium) in one serving. Most people should limit total sodium intake to less than 2,300 mg per day. Always check the nutrition information of foods labeled as "low-fat" or "nonfat." These foods may be higher in added sugar or refined carbs and should be avoided. Talk to your dietitian to identify your daily goals for nutrients listed on  the label. Shopping Avoid buying canned, pre-made, or processed foods. These foods tend to be high in fat, sodium, and added sugar. Shop around the outside edge of the grocery store. This is where you will most often find fresh fruits and vegetables, bulk grains, fresh meats, and fresh dairy. Cooking Use low-heat cooking methods, such as baking, instead of high-heat cooking methods like deep frying. Cook using healthy oils, such as olive, canola, or sunflower oil. Avoid cooking with butter, cream, or high-fat meats. Meal planning Eat meals and snacks regularly, preferably at the same times every day. Avoid going long periods of time without eating. Eat foods that are high in fiber, such as fresh fruits, vegetables, beans, and whole grains. Talk with your dietitian about how many servings of carbs you can eat at each meal. Eat 4-6 oz (112-168 g) of lean protein each day, such as lean meat, chicken, fish, eggs, or tofu. One ounce (oz) of lean protein is equal to: 1 oz (28 g) of meat, chicken, or fish. 1 egg.  cup (62 g) of tofu. Eat some foods each day that contain healthy fats, such as avocado, nuts, seeds, and fish. What foods should I eat? Fruits Berries. Apples. Oranges. Peaches. Apricots. Plums. Grapes. Mango. Papaya.Pomegranate. Kiwi. Cherries. Vegetables Lettuce. Spinach. Leafy greens, including kale, chard, collard greens, and mustard greens. Beets. Cauliflower. Cabbage. Broccoli. Carrots. Green beans.Tomatoes. Peppers. Onions. Cucumbers. Brussels sprouts. Grains Whole grains, such as whole-wheat or whole-grain bread, crackers, tortillas,cereal, and pasta. Unsweetened oatmeal. Quinoa. Brown or wild rice. Meats and other proteins Seafood. Poultry without skin. Lean cuts of poultry and beef. Tofu. Nuts. Seeds. Dairy Low-fat or fat-free dairy products such as milk, yogurt, and cheese. The items listed above may not be a complete list of foods and beverages you can eat. Contact a  dietitian for more information. What foods should I avoid? Fruits Fruits canned with syrup. Vegetables Canned vegetables. Frozen vegetables with butter or cream sauce. Grains Refined white flour and flour products such as bread, pasta, snack foods, andcereals. Avoid all processed foods. Meats and other proteins Fatty cuts of meat. Poultry with skin. Breaded or fried meats. Processed meat.Avoid saturated fats. Dairy Full-fat yogurt, cheese, or milk. Beverages Sweetened drinks, such as soda or iced tea. The items listed above may not be a complete list of foods and beverages you should avoid. Contact a dietitian for more information. Questions to ask a health care provider Do I need to meet with a diabetes educator? Do I need to meet with a dietitian? What number can I call if I have questions? When are the best times to check my blood glucose? Where to find more information: American Diabetes Association: diabetes.org Academy of Nutrition and Dietetics: www.eatright.[employer] of Diabetes and Digestive and Kidney Diseases: [place] Association of Diabetes Care and Education Specialists: www.diabeteseducator.org Summary It is important to have healthy eating habits because your blood sugar (glucose) levels are greatly affected by what you eat and drink. A healthy meal plan  will help you control your blood glucose and maintain a healthy lifestyle. Your health care provider may recommend that you work with a dietitian to make a meal plan that is best for you. Keep in mind that carbohydrates (carbs) and alcohol have immediate effects on your blood glucose levels. It is important to count carbs and to use alcohol carefully. This information is not intended to replace advice given to you by your health care provider. Make sure you discuss any questions you have with your healthcare provider. Document Revised: 12/10/2018 Document Reviewed: 12/10/2018 Elsevier Patient  Education  2021 [place] Eating Plan DASH stands for Dietary Approaches to Stop Hypertension. The DASH eating plan is a healthy eating plan that has been shown to: Reduce high blood pressure (hypertension). Reduce your risk for type 2 diabetes, heart disease, and stroke. Help with weight loss. What are tips for following this plan? Reading food labels Check food labels for the amount of salt (sodium) per serving. Choose foods with less than 5 percent of the Daily Value of sodium. Generally, foods with less than 300 milligrams (mg) of sodium per serving fit into this eating plan. To find whole grains, look for the word "whole" as the first word in the ingredient list. Shopping Buy products labeled as "low-sodium" or "no salt added." Buy fresh foods. Avoid canned foods and pre-made or frozen meals. Cooking Avoid adding salt when cooking. Use salt-free seasonings or herbs instead of table salt or sea salt. Check with your health care provider or pharmacist before using salt substitutes. Do not fry foods. Cook foods using healthy methods such as baking, boiling, grilling, roasting, and broiling instead. Cook with heart-healthy oils, such as olive, canola, avocado, soybean, or sunflower oil. Meal planning  Eat a balanced diet that includes: 4 or more servings of fruits and 4 or more servings of vegetables each day. Try to fill one-half of your plate with fruits and vegetables. 6-8 servings of whole grains each day. Less than 6 oz (170 g) of lean meat, poultry, or fish each day. A 3-oz (85-g) serving of meat is about the same size as a deck of cards. One egg equals 1 oz (28 g). 2-3 servings of low-fat dairy each day. One serving is 1 cup (237 mL). 1 serving of nuts, seeds, or beans 5 times each week. 2-3 servings of heart-healthy fats. Healthy fats called omega-3 fatty acids are found in foods such as walnuts, flaxseeds, fortified milks, and eggs. These fats are also found in  cold-water fish, such as sardines, salmon, and mackerel. Limit how much you eat of: Canned or prepackaged foods. Food that is high in trans fat, such as some fried foods. Food that is high in saturated fat, such as fatty meat. Desserts and other sweets, sugary drinks, and other foods with added sugar. Full-fat dairy products. Do not salt foods before eating. Do not eat more than 4 egg yolks a week. Try to eat at least 2 vegetarian meals a week. Eat more home-cooked food and less restaurant, buffet, and fast food.  Lifestyle When eating at a restaurant, ask that your food be prepared with less salt or no salt, if possible. If you drink alcohol: Limit how much you use to: 0-1 drink a day for women who are not pregnant. 0-2 drinks a day for men. Be aware of how much alcohol is in your drink. In the [place] one drink equals one 12 oz bottle of beer (355 mL), one 5 oz  glass of wine (148 mL), or one 1 oz glass of hard liquor (44 mL). General information Avoid eating more than 2,300 mg of salt a day. If you have hypertension, you may need to reduce your sodium intake to 1,500 mg a day. Work with your health care provider to maintain a healthy body weight or to lose weight. Ask what an ideal weight is for you. Get at least 30 minutes of exercise that causes your heart to beat faster (aerobic exercise) most days of the week. Activities may include walking, swimming, or biking. Work with your health care provider or dietitian to adjust your eating plan to your individual calorie needs. What foods should I eat? Fruits All fresh, dried, or frozen fruit. Canned fruit in natural juice (without addedsugar). Vegetables Fresh or frozen vegetables (raw, steamed, roasted, or grilled). Low-sodium or reduced-sodium tomato and vegetable juice. Low-sodium or reduced-sodium tomatosauce and tomato paste. Low-sodium or reduced-sodium canned vegetables. Grains Whole-grain or whole-wheat bread. Whole-grain or  whole-wheat pasta. Brown rice. [place]. Bulgur. Whole-grain and low-sodium cereals. Pita bread.Low-fat, low-sodium crackers. Whole-wheat flour tortillas. Meats and other proteins Skinless chicken or [place]. Ground chicken or [place]. Pork with fat trimmed off. Fish and seafood. Egg whites. Dried beans, peas, or lentils. Unsalted nuts, nut butters, and seeds. Unsalted canned beans. Lean cuts of beef with fat trimmed off. Low-sodium, lean precooked or cured meat, such as sausages or meatloaves. Dairy Low-fat (1%) or fat-free (skim) milk. Reduced-fat, low-fat, or fat-free cheeses. Nonfat, low-sodium ricotta or cottage cheese. Low-fat or nonfatyogurt. Low-fat, low-sodium cheese. Fats and oils Soft margarine without trans fats. Vegetable oil. Reduced-fat, low-fat, or light mayonnaise and salad dressings (reduced-sodium). Canola, safflower, olive, avocado, soybean, andsunflower oils. Avocado. Seasonings and condiments Herbs. Spices. Seasoning mixes without salt. Other foods Unsalted popcorn and pretzels. Fat-free sweets. The items listed above may not be a complete list of foods and beverages you can eat. Contact a dietitian for more information. What foods should I avoid? Fruits Canned fruit in a light or heavy syrup. Fried fruit. Fruit in cream or buttersauce. Vegetables Creamed or fried vegetables. Vegetables in a cheese sauce. Regular canned vegetables (not low-sodium or reduced-sodium). Regular canned tomato sauce and paste (not low-sodium or reduced-sodium). Regular tomato and vegetable juice(not low-sodium or reduced-sodium). Angie Fava. Olives. Grains Baked goods made with fat, such as croissants, muffins, or some breads. Drypasta or rice meal packs. Meats and other proteins Fatty cuts of meat. Ribs. Fried meat. Berniece Salines. Bologna, salami, and other precooked or cured meats, such as sausages or meat loaves. Fat from the back of a pig (fatback). Bratwurst. Salted nuts and seeds. Canned beans  with added salt. Canned orsmoked fish. Whole eggs or egg yolks. Chicken or [place] with skin. Dairy Whole or 2% milk, cream, and half-and-half. Whole or full-fat cream cheese. Whole-fat or sweetened yogurt. Full-fat cheese. Nondairy creamers. Whippedtoppings. Processed cheese and cheese spreads. Fats and oils Butter. Stick margarine. Lard. Shortening. Ghee. Bacon fat. Tropical oils, suchas coconut, palm kernel, or palm oil. Seasonings and condiments Onion salt, garlic salt, seasoned salt, table salt, and sea salt. Worcestershire sauce. Tartar sauce. Barbecue sauce. Teriyaki sauce. Soy sauce, including reduced-sodium. Steak sauce. Canned and packaged gravies. Fish sauce. Oyster sauce. Cocktail sauce. Store-bought horseradish. Ketchup. Mustard. Meat flavorings and tenderizers. Bouillon cubes. Hot sauces. Pre-made or packaged marinades. Pre-made or packaged taco seasonings. Relishes. Regular saladdressings. Other foods Salted popcorn and pretzels. The items listed above may not be a complete list of foods and beverages you should avoid.  Contact a dietitian for more information. Where to find more information National Heart, Lung, and Blood Institute: https://wilson-eaton.com/ American Heart Association: www.heart.org Academy of Nutrition and Dietetics: www.eatright.[place]: www.kidney.org Summary The DASH eating plan is a healthy eating plan that has been shown to reduce high blood pressure (hypertension). It may also reduce your risk for type 2 diabetes, heart disease, and stroke. When on the DASH eating plan, aim to eat more fresh fruits and vegetables, whole grains, lean proteins, low-fat dairy, and heart-healthy fats. With the DASH eating plan, you should limit salt (sodium) intake to 2,300 mg a day. If you have hypertension, you may need to reduce your sodium intake to 1,500 mg a day. Work with your health care provider or dietitian to adjust your eating plan to your individual  calorie needs. This information is not intended to replace advice given to you by your health care provider. Make sure you discuss any questions you have with your healthcare provider. Document Revised: 12/06/2018 Document Reviewed: 12/06/2018 Elsevier Patient Education  2022 [employer].

## 2020-07-26 DIAGNOSIS — G4733 Obstructive sleep apnea (adult) (pediatric): Secondary | ICD-10-CM | POA: Diagnosis not present

## 2020-08-05 ENCOUNTER — Ambulatory Visit (HOSPITAL_COMMUNITY)
Admission: RE | Admit: 2020-08-05 | Discharge: 2020-08-05 | Disposition: A | Discharge status: Home / Self Care | Payer: Medicare Other | Source: Ambulatory Visit | Attending: Cardiology | Admitting: Cardiology

## 2020-08-05 ENCOUNTER — Other Ambulatory Visit: Payer: Self-pay

## 2020-08-05 DIAGNOSIS — N182 Chronic kidney disease, stage 2 (mild): Secondary | ICD-10-CM | POA: Diagnosis not present

## 2020-08-05 DIAGNOSIS — E1122 Type 2 diabetes mellitus with diabetic chronic kidney disease: Secondary | ICD-10-CM | POA: Insufficient documentation

## 2020-08-05 DIAGNOSIS — I10 Essential (primary) hypertension: Secondary | ICD-10-CM | POA: Diagnosis not present

## 2020-08-05 NOTE — Progress Notes (Signed)
Patient is aware of ultrasound results. -e welch

## 2020-08-11 ENCOUNTER — Other Ambulatory Visit: Payer: Self-pay | Admitting: Internal Medicine

## 2020-08-16 ENCOUNTER — Other Ambulatory Visit: Payer: Self-pay

## 2020-08-16 ENCOUNTER — Emergency Department (HOSPITAL_BASED_OUTPATIENT_CLINIC_OR_DEPARTMENT_OTHER): Payer: Medicare Other | Admitting: Radiology

## 2020-08-16 ENCOUNTER — Encounter (HOSPITAL_BASED_OUTPATIENT_CLINIC_OR_DEPARTMENT_OTHER): Payer: Self-pay | Admitting: Obstetrics and Gynecology

## 2020-08-16 ENCOUNTER — Emergency Department (HOSPITAL_BASED_OUTPATIENT_CLINIC_OR_DEPARTMENT_OTHER)
Admission: EM | Admit: 2020-08-16 | Discharge: 2020-08-16 | Disposition: A | Discharge status: Home / Self Care | Payer: Medicare Other | Attending: Emergency Medicine | Admitting: Emergency Medicine

## 2020-08-16 ENCOUNTER — Emergency Department (HOSPITAL_BASED_OUTPATIENT_CLINIC_OR_DEPARTMENT_OTHER): Payer: Medicare Other

## 2020-08-16 DIAGNOSIS — Z96652 Presence of left artificial knee joint: Secondary | ICD-10-CM | POA: Insufficient documentation

## 2020-08-16 DIAGNOSIS — E1140 Type 2 diabetes mellitus with diabetic neuropathy, unspecified: Secondary | ICD-10-CM | POA: Diagnosis not present

## 2020-08-16 DIAGNOSIS — Z8546 Personal history of malignant neoplasm of prostate: Secondary | ICD-10-CM | POA: Insufficient documentation

## 2020-08-16 DIAGNOSIS — U071 COVID-19: Secondary | ICD-10-CM | POA: Diagnosis not present

## 2020-08-16 DIAGNOSIS — I129 Hypertensive chronic kidney disease with stage 1 through stage 4 chronic kidney disease, or unspecified chronic kidney disease: Secondary | ICD-10-CM | POA: Diagnosis not present

## 2020-08-16 DIAGNOSIS — Z79899 Other long term (current) drug therapy: Secondary | ICD-10-CM | POA: Diagnosis not present

## 2020-08-16 DIAGNOSIS — R9431 Abnormal electrocardiogram [ECG] [EKG]: Secondary | ICD-10-CM | POA: Diagnosis not present

## 2020-08-16 DIAGNOSIS — Z955 Presence of coronary angioplasty implant and graft: Secondary | ICD-10-CM | POA: Insufficient documentation

## 2020-08-16 DIAGNOSIS — R059 Cough, unspecified: Secondary | ICD-10-CM | POA: Diagnosis not present

## 2020-08-16 DIAGNOSIS — E1122 Type 2 diabetes mellitus with diabetic chronic kidney disease: Secondary | ICD-10-CM | POA: Insufficient documentation

## 2020-08-16 DIAGNOSIS — R071 Chest pain on breathing: Secondary | ICD-10-CM | POA: Diagnosis not present

## 2020-08-16 DIAGNOSIS — Z7984 Long term (current) use of oral hypoglycemic drugs: Secondary | ICD-10-CM | POA: Insufficient documentation

## 2020-08-16 DIAGNOSIS — Z7982 Long term (current) use of aspirin: Secondary | ICD-10-CM | POA: Insufficient documentation

## 2020-08-16 DIAGNOSIS — N182 Chronic kidney disease, stage 2 (mild): Secondary | ICD-10-CM | POA: Insufficient documentation

## 2020-08-16 LAB — TROPONIN I (HIGH SENSITIVITY): Troponin I (High Sensitivity): 2 ng/L (ref <18)

## 2020-08-16 LAB — BASIC METABOLIC PANEL
Anion gap: 15 (ref 5–15)
BUN: 17 mg/dL (ref 8–23)
CO2: 18 mmol/L — ABNORMAL LOW (ref 22–32)
Calcium: 9.1 mg/dL (ref 8.9–10.3)
Chloride: 104 mmol/L (ref 98–111)
Creatinine, Ser: 0.93 mg/dL (ref 0.61–1.24)
GFR, Estimated: >60 mL/min (ref >60)
Glucose, Bld: 110 mg/dL — ABNORMAL HIGH (ref 70–99)
Potassium: 5.3 mmol/L — ABNORMAL HIGH (ref 3.5–5.1)
Sodium: 137 mmol/L (ref 135–145)

## 2020-08-16 LAB — CBC
HCT: 41.3 % (ref 39.0–52.0)
Hemoglobin: 13.8 g/dL (ref 13.0–17.0)
MCH: 29.9 pg (ref 26.0–34.0)
MCHC: 33.4 g/dL (ref 30.0–36.0)
MCV: 89.6 fL (ref 80.0–100.0)
Platelets: 186 10*3/uL (ref 150–400)
RBC: 4.61 MIL/uL (ref 4.22–5.81)
RDW: 15.4 % (ref 11.5–15.5)
WBC: 6.5 10*3/uL (ref 4.0–10.5)
nRBC: 0 % (ref 0.0–0.2)

## 2020-08-16 NOTE — ED Provider Notes (Signed)
[facility]GENC[facility] DEPT Provider Note   CSN: [id number] Arrival date & time: 08/16/20  1718     History Chief Complaint  Patient presents with   Covid Positive    Jason Wyatt is a 65 y.o. male.  Patient is a 65 year old male with a history of chronic kidney disease stage II, hypertension, hyperlipidemia, incomplete right bundle branch block, diabetes, Barrett's esophagus, sleep apnea who is presenting today with 6 days of cough, congestion, general malaise and fatigue.  Patient tested for COVID on Wednesday and it was negative but tested again on Friday and it came back positive.  He reports feeling winded with any activity but then admits that this has been going on even before he was diagnosed with COVID.  He has been following with his doctor had a renal ultrasound done which was negative, EKGs in the office which were negative but is planning on following up with his doctor about this.  Since having COVID he has noticed some more central chest discomfort that he describes as a sharp pain with coughing and taking a deep breath.  He has not noticed any new swelling in his lower extremities.  Denies fever, abdominal pain, nausea, vomiting and diarrhea.  Appetite has been okay.  He did receive the vaccine and booster.  The history is provided by the patient.      Past Medical History:  Diagnosis Date   Acute meniscal tear of left knee    Allergic rhinitis    Arthritis     back   Borderline glaucoma    Chronic back pain    stenosis   CKD (chronic kidney disease), stage II    Complication of anesthesia cervical fusion surgery 06/2010   "woke up next day w/ventilator on" told due to OSA   GERD (gastroesophageal reflux disease)    takes Prevacid daily   Gout    takes Allopurinol daily and Colchicine if needed   History of adenomatous polyp of colon    tubular adenoma's   History of Barrett's esophagus    History of hiatal hernia    Hyperlipidemia     Hypertension    Incomplete right bundle branch block (RBBB)    OA (osteoarthritis)    knee   OSA on CPAP    Peripheral neuropathy    Prostate cancer [facility]) urologist-  dr Alinda Money   dx 2013  s/p  radical non-nerve sparing prostatectomy 2014--  Stage pT2cNx,  PSA 3.48,  Gleason 3+3,  vol 55cc/  current PSA  0.01 (Aug 2016)   Rupture of Achilles tendon    Thyroid nodule    left side   Type II diabetes mellitus ([place])    Urinary frequency    takes Flomax daily   Urine incontinence    Wears glasses     Patient Active Problem List   Diagnosis Date Noted   Shortness of breath 07/22/2020   OSA on CPAP 07/22/2020   Obesity with alveolar hypoventilation, unspecified obesity severity ([place]) 06/23/2020   CPAP (continuous positive airway pressure) dependence 06/23/2020   Other insomnia 06/23/2020   S/P UPPP (uvulopalatopharyngoplasty) 06/23/2020   Iron deficiency anemia 03/06/2020   History of prostate cancer 08/07/2019   Cubital tunnel syndrome 03/27/2018   Heart rate fast 02/27/2018   Bilateral carpal tunnel syndrome 12/28/2017   Gait instability 06/27/2017   Primary osteoarthritis of left knee 08/05/2015   Normal coronary arteries 07/08/2015   DM type 2 with diabetic peripheral neuropathy ([place]) 10/07/2014  RLS 07/26/2014   Medication management 07/26/2014   Hyperlipidemia associated with type 2 diabetes mellitus ([place]) 08/24/2011   Hypertension 08/24/2011   T2_NIDDM w/ CKD 2  08/05/2011   Gout 08/05/2011   Morbid obesity ([place]) 08/04/2011   Obesity hypoventilation syndrome ([place]) 08/04/2011   CKD stage 2 due to type 2 diabetes mellitus ([place]) 08/03/2011    Past Surgical History:  Procedure Laterality Date   ACHILLES TENDON SURGERY Left 06/27/2017   Procedure: ACHILLES TENDON REPAIR;  Surgeon: Evelina Bucy, DPM;  Location: [facility];  Service: Podiatry;  Laterality: Left;   ANAL FISSURE REPAIR  06-05-2002   BACK SURGERY     CARDIAC CATHETERIZATION     CARDIOVASCULAR STRESS TEST   01-01-2008   normal nuclear study/  no ischemia/  normal LV function and wall motion , 57%   CARPAL TUNNEL RELEASE Left 03/27/2018   CARPAL TUNNEL RELEASE Right 06/2018   ESOPHAGOGASTRODUODENOSCOPY  09/26/2011   Procedure: ESOPHAGOGASTRODUODENOSCOPY (EGD);  Surgeon: Cleotis Nipper, MD;  Location: [facility];  Service: Endoscopy;  Laterality: N/A;   GASTROC RECESSION EXTREMITY Left 06/27/2017   Procedure: GASTROC RECESSION EXTREMITY;  Surgeon: Evelina Bucy, DPM;  Location: [facility];  Service: Podiatry;  Laterality: Left;   KNEE ARTHROSCOPY WITH MEDIAL MENISECTOMY Left 12/31/2014   Procedure: LEFT KNEE ARTHROSCOPY, PARTIAL MEDIAL AND LATERAL  MENISECTOMIES WITH [place];  Surgeon: Susa Day, MD;  Location: [facility];  Service: Orthopedics;  Laterality: Left;   LEFT AND RIGHT HEART CATHETERIZATION WITH CORONARY ANGIOGRAM N/A 08/25/2011   Procedure: LEFT AND RIGHT HEART CATHETERIZATION WITH CORONARY ANGIOGRAM;  Surgeon: Peter M Martinique, MD;  Location: [facility];  Service: Cardiovascular;  Laterality: N/A;   Normal coronary arteries and LVF, ef 55-60%   POSTERIOR FUSION CERVICAL SPINE  07-05-2010   C1 --  C4   POSTERIOR LUMBAR FUSION  05-18-2014   L4 -- S1   ROBOT ASSISTED LAPAROSCOPIC RADICAL PROSTATECTOMY  02/07/2012   Procedure: ROBOTIC ASSISTED LAPAROSCOPIC RADICAL PROSTATECTOMY LEVEL 1;  Surgeon: Molli Hazard, MD;  Location: [facility];  Service: Urology;  Laterality: N/A;      TENDON TRANSFER Left 06/27/2017   Procedure: TENDON TRANSFER;  Surgeon: Evelina Bucy, DPM;  Location: [facility];  Service: Podiatry;  Laterality: Left;   TOTAL KNEE ARTHROPLASTY Left 08/05/2015   Procedure: LEFT TOTAL KNEE ARTHROPLASTY;  Surgeon: Susa Day, MD;  Location: [facility];  Service: Orthopedics;  Laterality: Left;   TRANSTHORACIC ECHOCARDIOGRAM  08-03-2011   mild LVH, ef 55-60%/  trivial TR and PR   UVULOPALATOPHARYNGOPLASTY (UPPP)/TONSILLECTOMY/SEPTOPLASTY  1990's   and  Adenoidectomy   wisdom teeth extracted   1982       Family History  Problem Relation Age of Onset   Hypertension Mother    Dementia Mother    Osteoporosis Mother    Osteoarthritis Mother    Cancer Father    Hyperlipidemia Sister    Hypertension Sister    Stroke Maternal Grandmother    Heart attack Maternal Grandmother    Hypertension Paternal Grandfather    Sleep apnea Son    Sleep apnea Son     Social History   Tobacco Use   Smoking status: Never   Smokeless tobacco: Never  Vaping Use   Vaping Use: Never used  Substance Use Topics   Alcohol use: Never   Drug use: Never    Home Medications Prior to Admission medications   Medication Sig Start Date End Date Taking? Authorizing Provider  acetaminophen (TYLENOL) 500 MG tablet Take 500 mg by mouth every 6 (six) hours as needed for moderate pain.    [provider]  acyclovir (ZOVIRAX) 400 MG tablet TAKE 1 TABLET BY MOUTH  DAILY AS NEEDED FOR COLD  SORES 08/25/19   Unk Pinto, MD  allopurinol (ZYLOPRIM) 300 MG tablet TAKE 1 TABLET BY MOUTH  DAILY FOR GOUT PREVENTION 06/10/20   Liane Comber, NP  aspirin 81 MG tablet Take 81 mg by mouth daily.    [provider]  atorvastatin (LIPITOR) 20 MG tablet Take 1 tablet (20 mg total) by mouth daily. 04/20/20 04/20/21  Garnet Sierras, NP  bisoprolol (ZEBETA) 10 MG tablet Take  1 tablet  every Morning  for BP 07/07/20   Unk Pinto, MD  Blood Glucose Monitoring Suppl (ONE TOUCH ULTRA 2) w/Device KIT SMARTSIG:Via Meter 12/03/19   [provider]  carbidopa-levodopa (SINEMET IR) 25-250 MG tablet TAKE 1 TABLET BY MOUTH  TWICE DAILY FOR RESTLESS  LEGS 06/10/20   Liane Comber, NP  dextromethorphan-guaiFENesin (MUCINEX DM) 30-600 MG 12hr tablet Take 1 tablet by mouth 2 (two) times daily as needed for cough.    [provider]  Dulaglutide (TRULICITY) 3 JI/9.6VE SOPN Inject 3 mg as directed once a week. 11/27/19   Liane Comber, NP  DULoxetine  (CYMBALTA) 60 MG capsule TAKE 1 CAPSULE BY MOUTH AT  BEDTIME 03/15/20   Liane Comber, NP  gabapentin (NEURONTIN) 600 MG tablet Take  1/2 to 1 tablet  2 to 3 x /day as needed for Pain 04/06/20   Unk Pinto, MD  Magnesium 500 MG CAPS Take 1,500 mg by mouth every evening.     [provider]  meloxicam (MOBIC) 7.5 MG tablet Take 1 tablet Daily  with Food  is needed for Pain 09/02/19   Garnet Sierras, NP  metFORMIN (GLUCOPHAGE-XR) 500 MG 24 hr tablet Take  2 tablets  2 x /day  with Meals for Diabetes 05/19/20   Unk Pinto, MD  methocarbamol (ROBAXIN) 500 MG tablet Take 1 tablet (500 mg total) by mouth daily as needed for muscle spasms. 09/02/19   Garnet Sierras, NP  Multiple Vitamin (MULTIVITAMIN) tablet Take 1 tablet by mouth daily.    [provider]  olmesartan (BENICAR) 40 MG tablet Take  1 tablet  every Night  for BP & Diabetic Kidney Protection 07/07/20   Unk Pinto, MD  pantoprazole (PROTONIX) 40 MG tablet Take  1 tablet  Daily  To Prevent  Heartburn & Indigestion 05/19/20   Unk Pinto, MD  sennosides-docusate sodium (SENOKOT-S) 8.6-50 MG tablet Take 1 tablet by mouth daily.     [provider]  tamsulosin (FLOMAX) 0.4 MG CAPS capsule Take  1 capsule  at Bedtime  for Prostate 04/06/20   Unk Pinto, MD  Vitamin D, Ergocalciferol, (DRISDOL) 50000 units CAPS capsule Take 1 capsule (50,000 Units total) by mouth as directed. Tuesday , Wednesday, Thursday, Sunday Patient taking differently: Take 50,000 Units by mouth 2 (two) times a week. Wednesday and Sunday 04/06/15   Vladimir Crofts, PA-C    Allergies    Fenofibrate, Flaxseed oil, Flaxseed [linseed oil], Lyrica [pregabalin], and Niaspan [niacin er]  Review of Systems   Review of Systems  All other systems reviewed and are negative.  Physical Exam Updated Vital Signs BP 116/79 (BP Location: Left Arm)   Pulse 71   Temp 98.1 F (36.7 C) (Oral)   Resp 20   SpO2 100%   Physical  Exam Vitals  and nursing note reviewed.  Constitutional:      General: He is not in acute distress.    Appearance: He is well-developed.  HENT:     Head: Normocephalic and atraumatic.  Eyes:     Conjunctiva/sclera: Conjunctivae normal.     Pupils: Pupils are equal, round, and reactive to light.  Cardiovascular:     Rate and Rhythm: Normal rate and regular rhythm.     Heart sounds: No murmur heard. Pulmonary:     Effort: Pulmonary effort is normal. No respiratory distress.     Breath sounds: Normal breath sounds. No wheezing or rales.     Comments: Mild tenderness with palpation of the chest Chest:     Chest wall: Tenderness present.  Abdominal:     General: There is no distension.     Palpations: Abdomen is soft.     Tenderness: There is no abdominal tenderness. There is no guarding or rebound.  Musculoskeletal:        General: No tenderness. Normal range of motion.     Cervical back: Normal range of motion and neck supple.     Right lower leg: No edema.     Left lower leg: No edema.  Skin:    General: Skin is warm and dry.     Findings: No erythema or rash.  Neurological:     Mental Status: He is alert and oriented to person, place, and time. Mental status is at baseline.  Psychiatric:        Mood and Affect: Mood normal.        Behavior: Behavior normal.    ED Results / Procedures / Treatments   Labs (all labs ordered are listed, but only abnormal results are displayed) Labs Reviewed  BASIC METABOLIC PANEL - Abnormal; Notable for the following components:      Result Value   Potassium 5.3 (*)    CO2 18 (*)    Glucose, Bld 110 (*)    All other components within normal limits  CBC  TROPONIN I (HIGH SENSITIVITY)  TROPONIN I (HIGH SENSITIVITY)    EKG EKG Interpretation  Date/Time:  Monday August 16 2020 18:21:10 EDT Ventricular Rate:  78 PR Interval:  220 QRS Duration: 102 QT Interval:  390 QTC Calculation: 444 R Axis:   -87 Text Interpretation: Sinus  rhythm with 1st degree A-V block Left axis deviation Low voltage QRS Incomplete right bundle branch block Inferior infarct , age undetermined Possible Anterolateral infarct , age undetermined No significant change since last tracing Confirmed by Blanchie Dessert [phone]) on 08/16/2020 6:48:32 PM  Radiology DG Chest Port 1 View  Result Date: 08/16/2020 CLINICAL DATA:  Chest congestion.  COVID positive.  Cough. EXAM: PORTABLE CHEST 1 VIEW COMPARISON:  09/18/2013 FINDINGS: The cardiomediastinal contours are normal. The lungs are clear. Pulmonary vasculature is normal. No consolidation, pleural effusion, or pneumothorax. Thoracic spondylosis with spurring. No acute osseous abnormalities are seen. IMPRESSION: No acute chest findings. Electronically Signed   By: Keith Rake M.D.   On: 08/16/2020 19:40    Procedures Procedures   Medications Ordered in ED Medications - No data to display  ED Course  I have reviewed the triage vital signs and the nursing notes.  Pertinent labs & imaging results that were available during my care of the patient were reviewed by me and considered in my medical decision making (see chart for details).    MDM Rules/Calculators/A&P  Pt with symptoms consistent with viral URI/covid.  Well appearing here.  No signs of breathing difficulty and sats are 100% on room air.  Patient does have some pleuritic type chest pain but is not tachycardic, and denies any productive cough.  Low suspicion for PE at this time.  Patient's COVID test was positive at home on Friday and symptoms of now been present for approximately 6 days.  No signs of pharyngitis, otitis or abnormal abdominal findings.   CXR wnl, troponin within normal limits, CBC within normal limits, BMP with normal creatinine and mildly elevated potassium of 5.3 most likely hemolysis.  Patient is outside of the realm to receive treatment as his symptoms have been present for 6 days.  However he is  well-appearing and encouraged him to follow-up with his doctor due to his ongoing dyspnea on exertion which is no different today.  And pt to return with any further problems.  MDM   Amount and/or Complexity of Data Reviewed Clinical lab tests: ordered and reviewed Tests in the radiology section of CPT: ordered and reviewed Tests in the medicine section of CPT: ordered and reviewed Independent visualization of images, tracings, or specimens: yes     Final Clinical Impression(s) / ED Diagnoses Final diagnoses:  COVID-19    Rx / DC Orders ED Discharge Orders     None        Blanchie Dessert, MD 08/16/20 2017

## 2020-08-16 NOTE — ED Triage Notes (Signed)
Patient reports to the ER for chest congestion and COVID +. Patient reports he has been having cough and gets fatigued really easily.

## 2020-08-19 ENCOUNTER — Ambulatory Visit: Payer: Medicare Other | Admitting: Adult Health Nurse Practitioner

## 2020-08-24 NOTE — Progress Notes (Signed)
FOLLOW UP  Assessment and Plan:  Brok was seen today for follow-up.  Diagnoses and all orders for this visit:  Resistant hypertension       - Blood pressures continue to run high at home but are very good in office and when he went to ER.  Advised to no longer take BP at home.         - - continue medications, DASH diet, exercise and monitor at home. Call if greater than 130/80.    Type 2 diabetes mellitus with stage 2 chronic kidney disease, without long-term current use of insulin (HCC) -     COMPLETE METABOLIC PANEL WITH GFR - Continue to monitor blood sugar , exercise, diet and medications  Hyperlipidemia associated with type 2 diabetes mellitus (HCC) -     COMPLETE METABOLIC PANEL WITH GFR       - Continue medication, diet and exercise.   DM type 2 with diabetic peripheral neuropathy (HCC)        - Continue diet, exercise and careful ambulation and foot care.  COVID-19 -     benzonatate (TESSALON PERLES) 100 MG capsule; Take 2 capsules (200 mg total) by mouth 3 (three) times daily as needed for cough (Max: 679m per day). -     ipratropium (ATROVENT HFA) 17 MCG/ACT inhaler; Inhale 2 puffs into the lungs 4 (four) times daily. -     azithromycin (ZITHROMAX) 250 MG tablet; Take 2 tablets (500 mg) on  Day 1,  followed by 1 tablet (250 mg) once daily on Days 2 through 5. -     CBC with Differential/Platelet    Continue diet and meds as discussed. Further disposition pending results of labs. Discussed med's effects and SE's.   Over 30 minutes of exam, counseling, chart review, and critical decision making was performed Future Appointments  Date Time Provider DPie Town 09/09/2020  9:15 AM HGlen Allen [facility]  10/18/2020  8:30 AM Dohmeier, CAsencion Partridge MD [facility] None  11/29/2020 10:00 AM CLiane Comber NP [facility] None  03/04/2021 11:00 AM CLiane Comber NP [facility] None     HPI 65y.o. male  presents for 3 month follow up on hypertension,  cholesterol, and diabetes.   Pt was seen 1 week ago for Covid at ER, no treatment was given.  Has shortness of breath, productive cough of yellow mucus, fatigue/malaise, nasal congestion and some sore throat.   His blood pressure has been elevated at home but has been running normal when at all healthcare visits, today their BP is BP: 118/80  BP Readings from Last 3 Encounters:  08/25/20 118/80  08/16/20 116/79  07/22/20 (!) 142/74    BMI is Body mass index is 39.37 kg/m., he is working on diet and exercise. Wt Readings from Last 3 Encounters:  08/25/20 (!) 306 lb 9.6 oz (139.1 kg)  07/22/20 (!) 314 lb (142.4 kg)  06/23/20 (!) 314 lb (142.4 kg)     He does not workout. He denies chest pain, shortness of breath, dizziness.   He is on cholesterol medication and denies myalgias. His cholesterol is at goal. The cholesterol last visit was:   Lab Results  Component Value Date   CHOL 103 07/22/2020   HDL 42 07/22/2020   LDLCALC 36 07/22/2020   TRIG 176 (H) 07/22/2020   CHOLHDL 2.5 07/22/2020    He has not been working on diet and exercise for Diabetes he is on bASA he is on ACE/ARB denies polyuria, polydypsia.  Last A1C was:  Lab Results  Component Value Date   HGBA1C 7.3 (H) 06/16/2020          Current Medications:   Current Outpatient Medications (Endocrine & Metabolic):    Dulaglutide (TRULICITY) 3 YS/1.6OH SOPN, Inject 3 mg as directed once a week.   metFORMIN (GLUCOPHAGE-XR) 500 MG 24 hr tablet, Take  2 tablets  2 x /day  with Meals for Diabetes  Current Outpatient Medications (Cardiovascular):    atorvastatin (LIPITOR) 20 MG tablet, Take 1 tablet (20 mg total) by mouth daily.   bisoprolol (ZEBETA) 10 MG tablet, Take  1 tablet  every Morning  for BP   olmesartan (BENICAR) 40 MG tablet, Take  1 tablet  every Night  for BP & Diabetic Kidney Protection  Current Outpatient Medications (Respiratory):    benzonatate (TESSALON PERLES) 100 MG capsule, Take 2 capsules  (200 mg total) by mouth 3 (three) times daily as needed for cough (Max: 611m per day).   dextromethorphan-guaiFENesin (MUCINEX DM) 30-600 MG 12hr tablet, Take 1 tablet by mouth 2 (two) times daily as needed for cough.   ipratropium (ATROVENT HFA) 17 MCG/ACT inhaler, Inhale 2 puffs into the lungs 4 (four) times daily.  Current Outpatient Medications (Analgesics):    acetaminophen (TYLENOL) 500 MG tablet, Take 500 mg by mouth every 6 (six) hours as needed for moderate pain.   aspirin 81 MG tablet, Take 81 mg by mouth daily.   meloxicam (MOBIC) 7.5 MG tablet, Take 1 tablet Daily  with Food  is needed for Pain   allopurinol (ZYLOPRIM) 300 MG tablet, TAKE 1 TABLET BY MOUTH  DAILY FOR GOUT PREVENTION (Patient not taking: Reported on 08/25/2020)   Current Outpatient Medications (Other):    acyclovir (ZOVIRAX) 400 MG tablet, TAKE 1 TABLET BY MOUTH  DAILY AS NEEDED FOR COLD  SORES   azithromycin (ZITHROMAX) 250 MG tablet, Take 2 tablets (500 mg) on  Day 1,  followed by 1 tablet (250 mg) once daily on Days 2 through 5.   Blood Glucose Monitoring Suppl (ONE TOUCH ULTRA 2) w/Device KIT, SMARTSIG:Via Meter   carbidopa-levodopa (SINEMET IR) 25-250 MG tablet, TAKE 1 TABLET BY MOUTH  TWICE DAILY FOR RESTLESS  LEGS   DULoxetine (CYMBALTA) 60 MG capsule, TAKE 1 CAPSULE BY MOUTH AT  BEDTIME   gabapentin (NEURONTIN) 600 MG tablet, Take  1/2 to 1 tablet  2 to 3 x /day as needed for Pain   Magnesium 500 MG CAPS, Take 1,500 mg by mouth every evening.    methocarbamol (ROBAXIN) 500 MG tablet, Take 1 tablet (500 mg total) by mouth daily as needed for muscle spasms.   Multiple Vitamin (MULTIVITAMIN) tablet, Take 1 tablet by mouth daily.   pantoprazole (PROTONIX) 40 MG tablet, Take  1 tablet  Daily  To Prevent  Heartburn & Indigestion   sennosides-docusate sodium (SENOKOT-S) 8.6-50 MG tablet, Take 1 tablet by mouth daily.    tamsulosin (FLOMAX) 0.4 MG CAPS capsule, Take  1 capsule  at Bedtime  for Prostate   Vitamin D,  Ergocalciferol, (DRISDOL) 50000 units CAPS capsule, Take 1 capsule (50,000 Units total) by mouth as directed. Tuesday , Wednesday, Thursday, Sunday (Patient taking differently: Take 50,000 Units by mouth 2 (two) times a week. Wednesday and Sunday)  Medical History:  Past Medical History:  Diagnosis Date   Acute meniscal tear of left knee    Allergic rhinitis    Arthritis     back   Borderline glaucoma    Chronic  back pain    stenosis   CKD (chronic kidney disease), stage II    Complication of anesthesia cervical fusion surgery 06/2010   "woke up next day w/ventilator on" told due to OSA   GERD (gastroesophageal reflux disease)    takes Prevacid daily   Gout    takes Allopurinol daily and Colchicine if needed   History of adenomatous polyp of colon    tubular adenoma's   History of Barrett's esophagus    History of hiatal hernia    Hyperlipidemia    Hypertension    Incomplete right bundle branch block (RBBB)    OA (osteoarthritis)    knee   OSA on CPAP    Peripheral neuropathy    Prostate cancer [facility]) urologist-  dr Alinda Money   dx 2013  s/p  radical non-nerve sparing prostatectomy 2014--  Stage pT2cNx,  PSA 3.48,  Gleason 3+3,  vol 55cc/  current PSA  0.01 (Aug 2016)   Rupture of Achilles tendon    Thyroid nodule    left side   Type II diabetes mellitus ([place])    Urinary frequency    takes Flomax daily   Urine incontinence    Wears glasses    Allergies:  Allergies  Allergen Reactions   Fenofibrate Itching and Rash   Flaxseed Oil Rash   Flaxseed [Linseed Oil] Rash   Lyrica [Pregabalin] Itching and Rash   Niaspan [Niacin Er] Rash     Review of Systems:  Review of Systems  Constitutional:  Positive for malaise/fatigue. Negative for chills and fever.  HENT:  Positive for congestion. Negative for hearing loss and sore throat.   Eyes:  Negative for blurred vision and double vision.  Respiratory:  Positive for cough, shortness of breath and wheezing.   Cardiovascular:   Positive for chest pain and palpitations.  Gastrointestinal:  Positive for diarrhea. Negative for abdominal pain, constipation, heartburn, nausea and vomiting.  Musculoskeletal:  Positive for joint pain and myalgias.  Skin:  Negative for rash.  Neurological:  Positive for sensory change and weakness. Negative for dizziness and headaches.  Psychiatric/Behavioral:  Negative for depression.    Family history- Review and unchanged Social history- Review and unchanged Physical Exam: BP 118/80   Pulse 71   Temp (!) 94.1 F (34.5 C)   Wt (!) 306 lb 9.6 oz (139.1 kg)   SpO2 96%   BMI 39.37 kg/m  Wt Readings from Last 3 Encounters:  08/25/20 (!) 306 lb 9.6 oz (139.1 kg)  07/22/20 (!) 314 lb (142.4 kg)  06/23/20 (!) 314 lb (142.4 kg)   General Appearance: Well nourished, in no apparent distress. Eyes: PERRLA, EOMs, conjunctiva no swelling or erythema Sinuses: No Frontal/maxillary tenderness ENT/Mouth: Ext aud canals clear, TMs without erythema, bulging. No erythema, swelling, or exudate on post pharynx.  Tonsils not swollen or erythematous. Post nasal drip clear mucus, Hearing normal.  Neck: Supple, thyroid normal.  Respiratory: Respiratory effort normal, BS diminished lower lobes with minimal crackles Cardio: RRR with no MRGs. Brisk peripheral pulses without edema.  Abdomen: Soft, + BS.  Non tender, no guarding, rebound, hernias, masses. Lymphatics: Non tender without lymphadenopathy.  Musculoskeletal: Full ROM, 5/5 strength, Normal gait Skin: Warm, dry without rashes, lesions, ecchymosis.  Neuro: Cranial nerves intact. No cerebellar symptoms. Diminished sensation toes to knees Psych: Awake and oriented X 3, normal affect, Insight and Judgment appropriate.    Magda Bernheim, NP 9:14 AM [facility]

## 2020-08-25 ENCOUNTER — Other Ambulatory Visit: Payer: Self-pay

## 2020-08-25 ENCOUNTER — Encounter: Payer: Self-pay | Admitting: Nurse Practitioner

## 2020-08-25 ENCOUNTER — Other Ambulatory Visit: Payer: Self-pay | Admitting: Internal Medicine

## 2020-08-25 ENCOUNTER — Ambulatory Visit (INDEPENDENT_AMBULATORY_CARE_PROVIDER_SITE_OTHER): Payer: Medicare Other | Admitting: Nurse Practitioner

## 2020-08-25 VITALS — BP 118/80 | HR 71 | Temp 94.1°F | Wt 306.6 lb

## 2020-08-25 DIAGNOSIS — E785 Hyperlipidemia, unspecified: Secondary | ICD-10-CM

## 2020-08-25 DIAGNOSIS — E1169 Type 2 diabetes mellitus with other specified complication: Secondary | ICD-10-CM | POA: Diagnosis not present

## 2020-08-25 DIAGNOSIS — E1122 Type 2 diabetes mellitus with diabetic chronic kidney disease: Secondary | ICD-10-CM

## 2020-08-25 DIAGNOSIS — E1142 Type 2 diabetes mellitus with diabetic polyneuropathy: Secondary | ICD-10-CM

## 2020-08-25 DIAGNOSIS — N182 Chronic kidney disease, stage 2 (mild): Secondary | ICD-10-CM

## 2020-08-25 DIAGNOSIS — U071 COVID-19: Secondary | ICD-10-CM

## 2020-08-25 DIAGNOSIS — I10 Essential (primary) hypertension: Secondary | ICD-10-CM

## 2020-08-25 MED ORDER — BENZONATATE 100 MG PO CAPS: 200.0000 mg | ORAL_CAPSULE | Freq: Three times a day (TID) | ORAL | 0 refills | Status: DC | PRN

## 2020-08-25 MED ORDER — BISOPROLOL FUMARATE 10 MG PO TABS: ORAL_TABLET | ORAL | 1 refills | Status: DC

## 2020-08-25 MED ORDER — OLMESARTAN MEDOXOMIL 40 MG PO TABS: ORAL_TABLET | ORAL | 1 refills | Status: DC

## 2020-08-25 MED ORDER — IPRATROPIUM BROMIDE HFA 17 MCG/ACT IN AERS: 2.0000 | INHALATION_SPRAY | Freq: Four times a day (QID) | RESPIRATORY_TRACT | 2 refills | Status: DC

## 2020-08-25 MED ORDER — AZITHROMYCIN 250 MG PO TABS: ORAL_TABLET | ORAL | 1 refills | Status: DC

## 2020-08-25 NOTE — Addendum Note (Signed)
Addended by: Magda Bernheim on: 08/25/2020 02:40 PM   Modules accepted: Orders

## 2020-08-25 NOTE — Patient Instructions (Signed)
How to Use a Soft Mist Inhaler  A soft mist inhaler [facility]) is a handheld device for taking medicine that must be breathed into the lungs (inhaled). The device changes a liquid medicine into a mist that can be inhaled. A soft mist inhaler is used when a disease causes the breathing tubes to narrow (bronchospasm). Using an [facility] helps prevent bronchospasm and keeps the airway open. An Bertha may be part of the long-term treatment for asthma or chronic obstructivepulmonary disease (COPD). The usual dosage is two inhalations every day. What are the risks? If you do not use the inhaler correctly, medicine might not reach your lungs to help you breathe. The medicine in the inhaler can cause side effects, such as: Chest tightness or difficulty breathing. Eye redness, eye pain, or vision changes. Difficulty passing urine. Dry mouth or sore throat. Cough. Headache. Sinus congestion (sinusitis). Supplies needed: A soft mist inhaler. The medicine that you need to breathe in comes in the inhaler. Each device contains the amount of medicine needed for 60 uses (30daily doses of 2 inhalations). How to use a soft mist inhaler Preparing a new inhaler for use Remove the clear base of the inhaler by pressing the safety catch on the cap with your thumb and pulling off the clear base with your other hand. On the label, write down the date that will be 3 months from now. This is the date when you should throw away the inhaler. Place the medicine cartridge that comes with the inhaler into the base of the inhaler. Press the cartridge on a flat surface to click it into place. Click the clear plastic base back into place over the cartridge. Turn the clear base in the direction of the arrows on the label until you hear a click. Open the cap on top of the inhaler. It should snap open all the way to show you the mouthpiece. Prepare (prime) the inhaler for use. To do this, point the inhaler toward the ground and press on the  dose-release button below the mouthpiece. You should see the release of some mist. Be careful not to get any mist into your eyes. If you do not see mist, close the cap, turn the base, open the cap, and prime the inhaler again until you see the mist. If you still do not see the mist, return the inhaler to your pharmacist for help. Taking an inhaled dose Hold the inhaler upright. Use your thumb and pointer finger to turn the base of the inhaler until you hear a click. This means the dose chamber is ready to deliver the medicine. Open the cap until you hear a click. Hold the inhaler in one hand with your pointer finger over the dose-release button. Turn your head away from the inhaler and breathe out slowly. Close your lips around the mouthpiece. Point the inhaler toward the back of your mouth. Press the dose-release button while taking a slow, deep breath through your mouth. Hold your breath for 10 seconds, or as long as you can. Turn your head away from the inhaler and breathe out slowly through pursed lips. Take a second inhalation if your health care provider told you to. Do not take extra doses even if you do not feel the mist as you inhale. Follow these instructions at home: Tips for using your inhaler Use a soft mist inhaler only as told by your health care provider. Do inhalations at about the same time each day. If you have not used the  inhaler: In more than 3 days, release a mist dose toward the ground before using. In 21 days or more, open the cap, turn the base, and prime the inhaler until you see mist. Repeat these steps three more times before using the inhaler. Caring for your [facility] Store the soft mist inhaler at room temperature. Keep it out of reach of children. Clean the mouthpiece of the [facility] with a damp, clean cloth once every week. General instructions Check the indicator on the inhaler to keep track of your doses. When the indicator is in the red zone, you have 7 days left.  Get a refill at this time. The inhaler will lock when it is empty. Do not use any products that contain nicotine or tobacco, such as cigarettes, e-cigarettes, and chewing tobacco. If you need help quitting, ask your health care provider. Tell your health care provider about: All your medical conditions. Use soft mist medicine with caution if you have glaucoma, an enlarged prostate, or kidney disease. If you are or may become pregnant. All medicines you take. Some medicines can interact with the medicines in the inhaler. Keep all follow-up visits as told by your health care provider. This is important. Contact a health care provider if: You have a very dry mouth or sore throat. You have a fever. You have a stuffy nose (nasal congestion) or nasal discharge. You have a cough that does not go away (is persistent). You have a headache. You have trouble passing urine. You are not sure how to use your inhaler or your inhaler is not working properly. Get help right away if: You have an allergic reaction. Symptoms may include an itchy rash, swelling of the face or tongue, or difficulty breathing. You have severe and sudden eye pain or changes in vision. You have severe shortness of breath. You have difficulty breathing. These symptoms may represent a serious problem that is an emergency. Do not wait to see if the symptoms will go away. Get medical help right away. Call your local emergency services (911 in the [place] Do not drive yourself to the hospital. Summary A soft mist inhaler [facility]) may be part of the long-term treatment for asthma or chronic obstructive pulmonary disease (COPD). The usual dosage is two inhalations each day to prevent bronchospasm. Follow instructions carefully to use the inhaler properly. Get help right away if you have an allergic reaction, sudden eye pain, changes in vision, shortness of breath, or difficulty breathing. This information is not intended to replace advice  given to you by your health care provider. Make sure you discuss any questions you have with your healthcare provider. Document Revised: 09/15/2019 Document Reviewed: 02/20/2019 Elsevier Patient Education  2022 [employer].

## 2020-08-26 DIAGNOSIS — G4733 Obstructive sleep apnea (adult) (pediatric): Secondary | ICD-10-CM | POA: Diagnosis not present

## 2020-08-26 LAB — COMPLETE METABOLIC PANEL WITH GFR
AG Ratio: 1.9 (calc) (ref 1.0–2.5)
ALT: 23 U/L (ref 9–46)
AST: 16 U/L (ref 10–35)
Albumin: 4.3 g/dL (ref 3.6–5.1)
Alkaline phosphatase (APISO): 60 U/L (ref 35–144)
BUN: 16 mg/dL (ref 7–25)
CO2: 26 mmol/L (ref 20–32)
Calcium: 9.7 mg/dL (ref 8.6–10.3)
Chloride: 105 mmol/L (ref 98–110)
Creat: 1.04 mg/dL (ref 0.70–1.35)
Globulin: 2.3 g/dL (calc) (ref 1.9–3.7)
Glucose, Bld: 125 mg/dL — ABNORMAL HIGH (ref 65–99)
Potassium: 4.6 mmol/L (ref 3.5–5.3)
Sodium: 141 mmol/L (ref 135–146)
Total Bilirubin: 0.7 mg/dL (ref 0.2–1.2)
Total Protein: 6.6 g/dL (ref 6.1–8.1)
eGFR: 79 mL/min/{1.73_m2} (ref >=60)

## 2020-08-26 LAB — CBC WITH DIFFERENTIAL/PLATELET
Absolute Monocytes: 555 cells/uL (ref 200–950)
Basophils Absolute: 68 cells/uL (ref 0–200)
Basophils Relative: 0.9 %
Eosinophils Absolute: 91 cells/uL (ref 15–500)
Eosinophils Relative: 1.2 %
HCT: 39.9 % (ref 38.5–50.0)
Hemoglobin: 13.4 g/dL (ref 13.2–17.1)
Lymphs Abs: 1368 cells/uL (ref 850–3900)
MCH: 30.1 pg (ref 27.0–33.0)
MCHC: 33.6 g/dL (ref 32.0–36.0)
MCV: 89.7 fL (ref 80.0–100.0)
MPV: 9.3 fL (ref 7.5–12.5)
Monocytes Relative: 7.3 %
Neutro Abs: 5518 cells/uL (ref 1500–7800)
Neutrophils Relative %: 72.6 %
Platelets: 327 10*3/uL (ref 140–400)
RBC: 4.45 10*6/uL (ref 4.20–5.80)
RDW: 14.7 % (ref 11.0–15.0)
Total Lymphocyte: 18 %
WBC: 7.6 10*3/uL (ref 3.8–10.8)

## 2020-09-01 ENCOUNTER — Other Ambulatory Visit: Payer: Self-pay | Admitting: Internal Medicine

## 2020-09-01 DIAGNOSIS — E1142 Type 2 diabetes mellitus with diabetic polyneuropathy: Secondary | ICD-10-CM

## 2020-09-01 DIAGNOSIS — N138 Other obstructive and reflux uropathy: Secondary | ICD-10-CM

## 2020-09-08 DIAGNOSIS — L821 Other seborrheic keratosis: Secondary | ICD-10-CM | POA: Diagnosis not present

## 2020-09-08 DIAGNOSIS — D692 Other nonthrombocytopenic purpura: Secondary | ICD-10-CM | POA: Diagnosis not present

## 2020-09-08 DIAGNOSIS — L98499 Non-pressure chronic ulcer of skin of other sites with unspecified severity: Secondary | ICD-10-CM | POA: Diagnosis not present

## 2020-09-08 DIAGNOSIS — L578 Other skin changes due to chronic exposure to nonionizing radiation: Secondary | ICD-10-CM | POA: Diagnosis not present

## 2020-09-09 ENCOUNTER — Encounter: Payer: Self-pay | Admitting: Podiatry

## 2020-09-09 ENCOUNTER — Ambulatory Visit: Payer: Medicare Other | Admitting: Podiatry

## 2020-09-09 ENCOUNTER — Other Ambulatory Visit: Payer: Self-pay

## 2020-09-09 DIAGNOSIS — E1142 Type 2 diabetes mellitus with diabetic polyneuropathy: Secondary | ICD-10-CM

## 2020-09-09 DIAGNOSIS — D2371 Other benign neoplasm of skin of right lower limb, including hip: Secondary | ICD-10-CM | POA: Diagnosis not present

## 2020-09-09 DIAGNOSIS — D2372 Other benign neoplasm of skin of left lower limb, including hip: Secondary | ICD-10-CM

## 2020-09-09 DIAGNOSIS — B351 Tinea unguium: Secondary | ICD-10-CM | POA: Diagnosis not present

## 2020-09-09 DIAGNOSIS — M79676 Pain in unspecified toe(s): Secondary | ICD-10-CM | POA: Diagnosis not present

## 2020-09-09 NOTE — Progress Notes (Signed)
He presents today complaining of painful elongated toenails and calluses bilaterally he states that the tips of the toes are exquisitely sore and he still has numbness and tingling.  Objective: Vital signs are stable alert orient x3 pulses remain palpable semiflexible hammertoe deformities have resulted in reactive hyper keratomas to the distal aspect of the toes with benign lesions.  Assessment: Pain in limb secondary to onychomycosis hammertoe deformity and calluses.  Plan: Debrided toenails 1 through 5 bilaterally debrided all reactive hyperkeratotic lesions I also recommended that he follow-up with [place] for a flexor tenotomy's in the near future.

## 2020-09-26 DIAGNOSIS — G4733 Obstructive sleep apnea (adult) (pediatric): Secondary | ICD-10-CM | POA: Diagnosis not present

## 2020-09-29 DIAGNOSIS — M791 Myalgia, unspecified site: Secondary | ICD-10-CM | POA: Diagnosis not present

## 2020-09-29 DIAGNOSIS — M545 Low back pain, unspecified: Secondary | ICD-10-CM | POA: Diagnosis not present

## 2020-09-29 DIAGNOSIS — M4326 Fusion of spine, lumbar region: Secondary | ICD-10-CM | POA: Diagnosis not present

## 2020-10-14 ENCOUNTER — Encounter: Payer: Self-pay | Admitting: Podiatry

## 2020-10-14 ENCOUNTER — Other Ambulatory Visit: Payer: Self-pay

## 2020-10-14 ENCOUNTER — Ambulatory Visit: Payer: Medicare Other | Admitting: Podiatry

## 2020-10-14 DIAGNOSIS — M2042 Other hammer toe(s) (acquired), left foot: Secondary | ICD-10-CM | POA: Diagnosis not present

## 2020-10-14 DIAGNOSIS — M205X9 Other deformities of toe(s) (acquired), unspecified foot: Secondary | ICD-10-CM

## 2020-10-14 NOTE — Patient Instructions (Signed)
Leave bandage in place and dry for 4 days, then remove. You may wash foot normally after removal of bandage. DO NOT SOAK FOOT! Dry completely afterwards and may use a bandaid over incision if needed. We will follow up with you in 1 weeks for recheck.  

## 2020-10-15 NOTE — Progress Notes (Signed)
He presents today for tenotomy's to toes #2 #4 of his left foot.  He has a history of a distal clavus to each toe which is been painful and recurrent and a source of infection.  Currently he denies fever chills nausea or vomiting.  He denies any pain in the foot.  Objective: Pulses remain palpable left lower extremity.  Flexible hammertoe deformities #2 #4 the left foot.  Assessment hammertoe deformities #2 #4 left foot.  Plan: Discussed etiology pathology conservative surgical therapies at this point I injected around the metatarsal phalangeal joints 3 cc 50-50 mixture Marcaine plain lidocaine with epinephrine to each toe.  He tolerated procedure well.  He was then prepped and draped in normal sterile fashion.  I then made his very small stab incision to the plantar aspect of the PIPJ to each toe.  The flexor tendon was transected and the toe sat rectus.  The area was copiously lavaged with alcohol dried and then a small amount of Dermabond was placed over the wound.  Dry sterile compressive dressing was applied he was dispensed a Darco shoe I will follow-up with him in 1 to 2 weeks.

## 2020-10-18 ENCOUNTER — Encounter: Payer: Self-pay | Admitting: Neurology

## 2020-10-18 ENCOUNTER — Ambulatory Visit: Payer: Medicare Other | Admitting: Neurology

## 2020-10-18 VITALS — BP 172/85 | HR 64 | Ht 74.0 in | Wt 309.0 lb

## 2020-10-18 DIAGNOSIS — Z9989 Dependence on other enabling machines and devices: Secondary | ICD-10-CM | POA: Diagnosis not present

## 2020-10-18 DIAGNOSIS — E662 Morbid (severe) obesity with alveolar hypoventilation: Secondary | ICD-10-CM

## 2020-10-18 DIAGNOSIS — G4733 Obstructive sleep apnea (adult) (pediatric): Secondary | ICD-10-CM | POA: Diagnosis not present

## 2020-10-18 DIAGNOSIS — Z9889 Other specified postprocedural states: Secondary | ICD-10-CM | POA: Diagnosis not present

## 2020-10-18 NOTE — Progress Notes (Signed)
SLEEP MEDICINE CLINIC    Provider:  Larey Seat, MD  Primary Care Physician:  Unk Pinto, MD [address] [place] [address]     Referring Provider: Unk Pinto, [place],  [place] [address]          Chief Complaint according to patient   Patient presents with:     New Patient (Initial Visit)     Patient here for a new CPAP, his is 65 years old, has a chip card, and is recalled. He was treated for OSA wuth obesity hypoventilation.       HISTORY OF PRESENT ILLNESS:  10-18-2020, Jason Wyatt who has been treated for obesity hypoventilation syndrome and has been CPAP dependent.  He is status post uvulopalatopharyngoplasty, also known as UPPP, he is walking with a four-point cane and he does have his left foot in a brace.  He was asked to repeat a home sleep test so that we have a new baseline his Epworth sleepiness score was endorsed at 10 out of 24 points his fatigue severity score at 37 out of 63 points while on CPAP.  His home sleep test showed only 2 hours and 52 minutes of recording time with a total sleep time of 1 hour 58 minutes so we repeated it and he had a calculated AHI of 58.3/h.  He truly cannot sleep without CPAP so based on this experience, I ordered an auto titration CPAP for him with a pressure range between 6 and 18 cmH2O with 3 cm EPR and a mask that he is to choose.   He went through a set up and received a Res vent machine, he has been 97% compliant by hours and 97% compliant by days which means 29 out of 30 days of use.  Average she uses the machine 7-1/2 hours, the pressure median is 4.5 cmH2O the air leak is very low at the 95th percentile it is 12.7 L/min events per hour AHI residual only 1.8/h which is an excellent resolution for a patient with such severe apnea.  No central apneas are arising there are still occasional rare hours respiratory events related arousals and these may indicate breakthrough  snoring but the mask does not fit.  So he is currently set up with a N20 nasal mask. Has magnetic clip head-gear( not a pacemaker patient)       06-23-2020   65 y.o.  Caucasian male patient seen here upon PCP  referral on 10/18/2020  for a new evaluation of apnea and therapy needs. Chief concern according to patient : " my machine is on recall, I cant sleep in a sleep lab and want my test at home" He is considered CPAP dependent, can't sleep without.    I have the pleasure of seeing Jason Wyatt today, a right-handed White or Caucasian male with OHV/ OSA disorder , CPAP dependent. He  has a past medical history of Acute meniscal tear of left knee, Allergic rhinitis, Arthritis, Borderline glaucoma, Chronic back pain, CKD (chronic kidney disease), stage II, Complication of anesthesia (cervical fusion surgery 06/2010), GERD (gastroesophageal reflux disease), Gout, History of adenomatous polyp of colon, History of Barrett's esophagus, History of hiatal hernia, Hyperlipidemia, Hypertension, Incomplete right bundle branch block (RBBB), OA (osteoarthritis), OSA on CPAP, Peripheral diabetic neuropathy, Prostate cancer ([place]) (urologist-  dr Alinda Money), Rupture of Achilles tendon, Thyroid nodule, Type II diabetes mellitus ([place]) for decades with retinal disease, Urinary frequency and incontinence, and Obesity.  He had a UPPP trying to replace  CPAP.    The patient had the first sleep study in the year 2007 (?) with a result of an AHI , at [facility], and a second sleep study at [place]- 2009. Machine came form [facility].  His current CPAP was downloaded here on 06-23-2020, AHI is residual at  3.9/h. set to 5 through 20 cm water , factory settings, 3 cm flex, 100% compliance.     Family medical /sleep history: No other family member on CPAP with OSA, insomnia, sleep walkers.  Sister is oxygen dependent, with DM and CHF, cardiac valve disease, neuropathy, severe edema, and adrenal tumor.    Social history:  Patient is  retired from [employer]" and lives in a household with his spouse, they have 2 adult sons, no grandchildren. The patient currently keeps the house. He no longer drives due to his neuropathy and sensorimotor impairment of both feet. His wife needs care- walker patient. Tremor.   Tobacco use: none .  ETOH use ; none , Caffeine intake in form of  Tea ( all day) . Regular exercise in form of walking the dog.    Sleep habits are as follows: The patient's dinner time is between 5-6 PM. The patient goes to bed at 10.30 PM and continues to sleep for 5-7 hours, wakes for one bathroom break, the first time at 3 AM. Dreams are reportedly frequent/vivid, sometimes it will wake him.   Around 7  AM is the usual rise time. The patient wakes up spontaneously.  He reports most mornings feeling refreshed /restored in AM, with symptoms such as stiiffness, aches and pain, dry mouth, seldom morning headaches and residual fatigue.  Naps are taken frequently in front of the TV , lasting from 15-30 minutes and are more  refreshing than nocturnal sleep, giving energy for another 2 hours.    Review of Systems: Out of a complete 14 system review, the patient complains of only the following symptoms, and all other reviewed systems are negative.:  Fatigue, sleepiness , snoring, when not on CPAP.    How likely are you to doze in the following situations: 0 = not likely, 1 = slight chance, 2 = moderate chance, 3 = high chance   Sitting and Reading?2 Watching Television?2 Sitting inactive in a public place (theater or meeting)?0 As a passenger in a car for an hour without a break?0 Lying down in the afternoon when circumstances permit?3 Sitting and talking to someone?0 Sitting quietly after lunch without alcohol?3 In a car, while stopped for a few minutes in traffic?0   Total = 10/ 24 points.   FSS endorsed at 37/ 63 points.  GDS at  3/ 15 points.   CPAP dependent.   Social History   Socioeconomic History    Marital status: Married    Spouse name: Not on file   Number of children: 2   Years of education: Not on file   Highest education level: Some college, no degree  Occupational History   Not on file  Tobacco Use   Smoking status: Never   Smokeless tobacco: Never  Vaping Use   Vaping Use: Never used  Substance and Sexual Activity   Alcohol use: Never   Drug use: Never   Sexual activity: Not on file  Other Topics Concern   Not on file  Social History Narrative   Lives at home with wife   Caffeine: never   Social Determinants of Engineer, drilling  Resource Strain: Not on file  Food Insecurity: Not on file  Transportation Needs: Not on file  Physical Activity: Not on file  Stress: Not on file  Social Connections: Not on file    Family History  Problem Relation Age of Onset   Hypertension Mother    Dementia Mother    Osteoporosis Mother    Osteoarthritis Mother    Cancer Father    Hyperlipidemia Sister    Hypertension Sister    Stroke Maternal Grandmother    Heart attack Maternal Grandmother    Hypertension Paternal Grandfather    Sleep apnea Son    Sleep apnea Son     Past Medical History:  Diagnosis Date   Acute meniscal tear of left knee    Allergic rhinitis    Arthritis     back   Borderline glaucoma    Chronic back pain    stenosis   CKD (chronic kidney disease), stage II    Complication of anesthesia cervical fusion surgery 06/2010   "woke up next day w/ventilator on" told due to OSA   GERD (gastroesophageal reflux disease)    takes Prevacid daily   Gout    takes Allopurinol daily and Colchicine if needed   History of adenomatous polyp of colon    tubular adenoma's   History of Barrett's esophagus    History of hiatal hernia    Hyperlipidemia    Hypertension    Incomplete right bundle branch block (RBBB)    OA (osteoarthritis)    knee   OSA on CPAP    Peripheral neuropathy    Prostate cancer [facility]) urologist-  dr Alinda Money   dx 2013  s/p  radical  non-nerve sparing prostatectomy 2014--  Stage pT2cNx,  PSA 3.48,  Gleason 3+3,  vol 55cc/  current PSA  0.01 (Aug 2016)   Rupture of Achilles tendon    Thyroid nodule    left side   Type II diabetes mellitus ([place])    Urinary frequency    takes Flomax daily   Urine incontinence    Wears glasses     Past Surgical History:  Procedure Laterality Date   ACHILLES TENDON SURGERY Left 06/27/2017   Procedure: ACHILLES TENDON REPAIR;  Surgeon: Evelina Bucy, DPM;  Location: [facility];  Service: Podiatry;  Laterality: Left;   ANAL FISSURE REPAIR  06-05-2002   BACK SURGERY     CARDIAC CATHETERIZATION     CARDIOVASCULAR STRESS TEST  01-01-2008   normal nuclear study/  no ischemia/  normal LV function and wall motion , 57%   CARPAL TUNNEL RELEASE Left 03/27/2018   CARPAL TUNNEL RELEASE Right 06/2018   ESOPHAGOGASTRODUODENOSCOPY  09/26/2011   Procedure: ESOPHAGOGASTRODUODENOSCOPY (EGD);  Surgeon: Cleotis Nipper, MD;  Location: [facility];  Service: Endoscopy;  Laterality: N/A;   GASTROC RECESSION EXTREMITY Left 06/27/2017   Procedure: GASTROC RECESSION EXTREMITY;  Surgeon: Evelina Bucy, DPM;  Location: [facility];  Service: Podiatry;  Laterality: Left;   KNEE ARTHROSCOPY WITH MEDIAL MENISECTOMY Left 12/31/2014   Procedure: LEFT KNEE ARTHROSCOPY, PARTIAL MEDIAL AND LATERAL  MENISECTOMIES WITH [place];  Surgeon: Susa Day, MD;  Location: [facility];  Service: Orthopedics;  Laterality: Left;   LEFT AND RIGHT HEART CATHETERIZATION WITH CORONARY ANGIOGRAM N/A 08/25/2011   Procedure: LEFT AND RIGHT HEART CATHETERIZATION WITH CORONARY ANGIOGRAM;  Surgeon: Peter M Martinique, MD;  Location: [facility] C[facility] LAB;  Service: Cardiovascular;  Laterality: N/A;   Normal coronary arteries and LVF, ef 55-60%  POSTERIOR FUSION CERVICAL SPINE  07-05-2010   C1 --  C4   POSTERIOR LUMBAR FUSION  05-18-2014   L4 -- S1   ROBOT ASSISTED LAPAROSCOPIC RADICAL PROSTATECTOMY  02/07/2012   Procedure: ROBOTIC  ASSISTED LAPAROSCOPIC RADICAL PROSTATECTOMY LEVEL 1;  Surgeon: Molli Hazard, MD;  Location: [facility];  Service: Urology;  Laterality: N/A;      TENDON TRANSFER Left 06/27/2017   Procedure: TENDON TRANSFER;  Surgeon: Evelina Bucy, DPM;  Location[facility];  Service: Podiatry;  Laterality: Left;   TOTAL KNEE ARTHROPLASTY Left 08/05/2015   Procedure: LEFT TOTAL KNEE ARTHROPLASTY;  Surgeon: Susa Day, MD;  [facility];  Service: Orthopedics;  Laterality: Left;   TRANSTHORACIC ECHOCARDIOGRAM  08-03-2011   mild LVH, ef 55-60%/  trivial TR and PR   UVULOPALATOPHARYNGOPLASTY (UPPP)/TONSILLECTOMY/SEPTOPLASTY  1990's   and Adenoidectomy   wisdom teeth extracted   1982     Current Outpatient Medications on File Prior to Visit  Medication Sig Dispense Refill   Accu-Chek Softclix Lancets lancets USE WITH METER TO CHECK  BLOOD SUGAR ONCE DAILY 100 each 3   acetaminophen (TYLENOL) 500 MG tablet Take 500 mg by mouth every 6 (six) hours as needed for moderate pain.     acyclovir (ZOVIRAX) 400 MG tablet TAKE 1 TABLET BY MOUTH  DAILY AS NEEDED FOR COLD  SORES 90 tablet 3   aspirin 81 MG tablet Take 81 mg by mouth daily.     atorvastatin (LIPITOR) 20 MG tablet Take 1 tablet (20 mg total) by mouth daily. 90 tablet 2   azithromycin (ZITHROMAX) 250 MG tablet Take 2 tablets (500 mg) on  Day 1,  followed by 1 tablet (250 mg) once daily on Days 2 through 5. 6 each 1   benzonatate (TESSALON PERLES) 100 MG capsule Take 2 capsules (200 mg total) by mouth 3 (three) times daily as needed for cough (Max: 663m per day). 60 capsule 0   bisoprolol (ZEBETA) 10 MG tablet Take  1 tablet  every Morning  for BP 90 tablet 1   Blood Glucose Monitoring Suppl (ONE TOUCH ULTRA 2) w/Device KIT SMARTSIG:Via Meter     carbidopa-levodopa (SINEMET IR) 25-250 MG tablet TAKE 1 TABLET BY MOUTH  TWICE DAILY FOR RESTLESS  LEGS 180 tablet 3   dextromethorphan-guaiFENesin (MUCINEX DM) 30-600 MG 12hr tablet Take 1 tablet by mouth 2  (two) times daily as needed for cough.     Dulaglutide (TRULICITY) 3 MZO/1.0RUSOPN Inject 3 mg as directed once a week. 2 mL 2   DULoxetine (CYMBALTA) 60 MG capsule TAKE 1 CAPSULE BY MOUTH AT  BEDTIME 90 capsule 3   gabapentin (NEURONTIN) 600 MG tablet TAKE 1/2 TO 1 TABLET BY  MOUTH 2 TO 3 TIMES DAILY AS NEEDED FOR PAIN 270 tablet 3   ipratropium (ATROVENT HFA) 17 MCG/ACT inhaler Inhale 2 puffs into the lungs 4 (four) times daily. 1 each 2   Magnesium 500 MG CAPS Take 1,500 mg by mouth every evening.      meloxicam (MOBIC) 7.5 MG tablet Take 1 tablet Daily  with Food  is needed for Pain 90 tablet 0   metFORMIN (GLUCOPHAGE-XR) 500 MG 24 hr tablet Take  2 tablets  2 x /day  with Meals for Diabetes 360 tablet 3   methocarbamol (ROBAXIN) 500 MG tablet Take 1 tablet (500 mg total) by mouth daily as needed for muscle spasms. 90 tablet 0   Multiple Vitamin (MULTIVITAMIN) tablet Take 1 tablet by mouth daily.  olmesartan (BENICAR) 40 MG tablet Take  1 tablet  every Night  for BP & Diabetic Kidney Protection 90 tablet 1   ONETOUCH ULTRA test strip USE TO CHECK BLOOD SUGAR  DAILY 100 strip 3   pantoprazole (PROTONIX) 40 MG tablet Take  1 tablet  Daily  To Prevent  Heartburn & Indigestion 90 tablet 3   sennosides-docusate sodium (SENOKOT-S) 8.6-50 MG tablet Take 1 tablet by mouth daily.      tamsulosin (FLOMAX) 0.4 MG CAPS capsule TAKE 1 CAPSULE BY MOUTH AT  BEDTIME FOR PROSTATE 90 capsule 3   Vitamin D, Ergocalciferol, (DRISDOL) 50000 units CAPS capsule Take 1 capsule (50,000 Units total) by mouth as directed. Tuesday , Wednesday, Thursday, Sunday (Patient taking differently: Take 50,000 Units by mouth 2 (two) times a week. Wednesday and Sunday) 90 capsule 1   No current facility-administered medications on file prior to visit.    Allergies  Allergen Reactions   Fenofibrate Itching and Rash   Flaxseed Oil Rash   Flaxseed [Linseed Oil] Rash   Lyrica [Pregabalin] Itching and Rash   Niaspan [Niacin  Er] Rash    Physical exam:  Today's Vitals   10/18/20 0828  BP: (!) 172/85  Pulse: 64  Weight: (!) 309 lb (140.2 kg)  Height: '6\' 2"'  (1.88 m)   Body mass index is 39.67 kg/m.   Wt Readings from Last 3 Encounters:  10/18/20 (!) 309 lb (140.2 kg)  08/25/20 (!) 306 lb 9.6 oz (139.1 kg)  07/22/20 (!) 314 lb (142.4 kg)     Ht Readings from Last 3 Encounters:  10/18/20 '6\' 2"'  (1.88 m)  07/22/20 '6\' 2"'  (1.88 m)  06/23/20 '6\' 2"'  (1.88 m)      General: The patient is awake, alert and appears not in acute distress.  The patient is well groomed. Head: Normocephalic, atraumatic. Neck is supple. Mallampati STATUS  POST UPPP., scalloped  Tongue.  neck circumference:22 inches . Nasal airflow is  patent.   Retrognathia is  seen.  Dental status:  Cardiovascular:  Regular rate and cardiac rhythm by pulse,  without distended neck veins. Respiratory: Lungs are clear to auscultation.  Skin:  Without evidence of ankle edema, or rash. Trunk: The patient's posture is erect.   Neurologic exam : The patient is awake and alert, oriented to place and time.   Memory subjective described as intact.  Attention span & concentration ability appears normal.  Speech is fluent,  without  dysarthria, dysphonia but some word finding delays-aphasia.  Mood and affect are appropriate.   Cranial nerves: no loss of smell or taste reported  Pupils are equal and briskly reactive to light.  Funduscopic exam ; deferred  Status post cataract .  Extraocular movements in vertical and horizontal planes were intact and without nystagmus. No Diplopia. Visual fields by finger perimetry are intact. Hearing was impaired  to soft voice and finger rubbing.    Facial sensation intact to fine touch.  Facial motor strength is symmetric and tongue was midline.  Neck ROM : rotation, tilt and flexion extension were normal for age and shoulder shrug was symmetrical.    Motor exam:  Symmetric bulk, tone and ROM.  There is loss of  foot dorsiflexion and plantarflexion.   Normal tone without cog wheeling, symmetric grip strength - weak ness of grip ! He has a pronator drift on the right. .   Sensory:  Fine touch, pinprick and vibration were absent in both legs below knee, Proprioception tested in the  upper extremities was normal. Numbness has ascended to the hands, all 10 fingers. He has had carpal tunnel repair on the right.    Coordination: Rapid alternating movements in the fingers/hands were of normal speed. He has a tremor affecting his handwriting, has numbness in his fingers.  The Finger-to-nose maneuver was intact without evidence of ataxia, dysmetria or tremor.   Gait and station: Patient could rise unassisted from a seated position, walked without assistive device.  Stance is of normal width/ base and the patient turned with 3 steps.  Toe and heel walk were deferred.  Deep tendon reflexes: in the  upper and lower extremities are absent - trace in upper extremities only- Babinski response was deferred.       After spending a total time of 20 minutes face to face and additional time for physical and neurologic examination, review of laboratory studies,  personal review of imaging studies, reports and results of other testing and review of referral information / records as far as provided in visit, I have established the following assessments:  1)  CPAP dependent - has been for over 15 years, has had a [employer] machine on recall- prescribed in 2009?  Based on a presumed diagnosis of OHV and OSA, hypoventilation. Confirmed severe OSA by HST and now on auto -CPAP ResVent machine . 2)  Morbid obesity, large neck, UPPP has not let to open airway. Macroglossia, retrognathia.     My Plan is to proceed with:  He can continue to use current machine and settings, has mustache which interferes with a nasal mask- but he wants to continue a large size N 20. .     I would like to thank Unk Pinto, MD [address] [place],  [place] [address] for allowing me to meet with and to take care of this pleasant patient.   I plan to follow up either personally or through our NP within 12 month.    Electronically signed by: Larey Seat, MD 10/18/2020 8:43 AM  [facility] Board certified by The [employer] of Sleep Medicine and Diplomate of the [employer] of Sleep Medicine. Board certified In Neurology through the [employer], Fellow of the [employer] of Neurology. Medical Director of [facility].

## 2020-10-18 NOTE — Patient Instructions (Signed)

## 2020-10-25 DIAGNOSIS — G4733 Obstructive sleep apnea (adult) (pediatric): Secondary | ICD-10-CM | POA: Diagnosis not present

## 2020-10-26 ENCOUNTER — Other Ambulatory Visit: Payer: Self-pay

## 2020-10-26 ENCOUNTER — Ambulatory Visit: Payer: Medicare Other | Admitting: Podiatry

## 2020-10-26 DIAGNOSIS — G4733 Obstructive sleep apnea (adult) (pediatric): Secondary | ICD-10-CM | POA: Diagnosis not present

## 2020-10-26 DIAGNOSIS — M205X9 Other deformities of toe(s) (acquired), unspecified foot: Secondary | ICD-10-CM

## 2020-10-26 NOTE — Progress Notes (Signed)
  Subjective:  Patient ID: JIANNI BATTEN, male    DOB: 04-Nov-1955,  MRN: [id number]  Chief Complaint  Patient presents with   Follow-up    tenotomy     65 y.o. male returns for post-op check.  Day procedure 10/14/2020 says he is doing well  Review of Systems: Negative except as noted in the HPI. Denies N/V/F/Ch.   Objective:  There were no vitals filed for this visit. There is no height or weight on file to calculate BMI. Constitutional Well developed. Well nourished.  Vascular Foot warm and well perfused. Capillary refill normal to all digits.   Neurologic Normal speech. Oriented to person, place, and time. Epicritic sensation to light touch grossly present bilaterally.  Dermatologic Second toe is well-healed with plantar scab, the fourth toe has some delayed healing  Orthopedic: Tenderness to palpation noted about the surgical site.   Assessment:   1. Contracture of toe joint    Plan:  Patient was evaluated and treated and all questions answered.  S/p foot surgery  Advised to continue dressing and putting a bandage on the fourth toe that has some delayed healing and use Neosporin on this as well.  Reevaluate in 3 weeks with Dr. [name].  Second toe is well-healed and can leave open to air and does not need appointment or dressings at this point  Return in about 3 weeks (around 11/16/2020) for tenotomy check .

## 2020-11-04 ENCOUNTER — Other Ambulatory Visit: Payer: Self-pay | Admitting: Adult Health Nurse Practitioner

## 2020-11-04 DIAGNOSIS — E785 Hyperlipidemia, unspecified: Secondary | ICD-10-CM

## 2020-11-04 DIAGNOSIS — E1169 Type 2 diabetes mellitus with other specified complication: Secondary | ICD-10-CM

## 2020-11-18 ENCOUNTER — Encounter: Payer: Self-pay | Admitting: Podiatry

## 2020-11-18 ENCOUNTER — Other Ambulatory Visit: Payer: Self-pay

## 2020-11-18 ENCOUNTER — Ambulatory Visit: Payer: Medicare Other | Admitting: Podiatry

## 2020-11-18 DIAGNOSIS — M205X9 Other deformities of toe(s) (acquired), unspecified foot: Secondary | ICD-10-CM

## 2020-11-18 NOTE — Progress Notes (Signed)
He presents today for follow-up of his tenotomy left foot second third and fourth toes he states that he seems to be doing well.  Objective: Vital signs are stable he is alert and oriented x3.  The toe is sitting rectus he has a reactive keratomas to the distal aspect which I debrided for him today there are no open lesions or wounds.  Assessment: Well-healing surgical foot.  Follow-up with him in about a month for his routine nail debridement.

## 2020-11-23 NOTE — Progress Notes (Signed)
Patient ID: Jason Wyatt, male   DOB: 1955-04-09, 65 y.o.   MRN: [id number]  Complete Physical Exam  Assessment and Plan  Diagnoses and all orders for this visit:  Encounter for CPE Due Yearly  Essential hypertension Continue medication Monitor blood pressure at home more closely; call if consistently over 130/80 Continue DASH diet.   Reminder to go to the ER if any CP, SOB, nausea, dizziness, severe HA, changes vision/speech, left arm numbness and tingling and jaw pain. CBC  Atherosclerotic peripheral vascular disease (HCC) Control blood pressure, cholesterol, glucose, increase exercise.   Other Chronic Back pain Continue to follow with Dr. Mitzi Hansen Continue Meloxicam and Robaxin as needed Encouraged continued weight loss  Obesity hypoventilation syndrome/OSA with CPAP Continue CPAP, weight loss encouraged  Type 2 diabetes mellitus with CKD [facility]) Education: Reviewed 'ABCs' of diabetes management (respective goals in parentheses):  A1C (<7), blood pressure (<130/80), and cholesterol (LDL <70) Eye Exam yearly and Dental Exam every 6 months. Dietary recommendations Physical Activity recommendations Persistently above goal with max dose metformin; now on trulicity, tolerating well, titrate up from 1.5 mg to 3 mg weekly;  - check lipid panel  CKD II associated with T2DM (HCC) Increase fluids, avoid NSAIDS, control sugars and blood pressure, will monitor - CMP/GFR  Hyperlipidemia associated with T2DM (HCC) Continue medications; at LDL goal <70 Continue low cholesterol diet and exercise.  Check lipid panel.   DM type 2 with diabetic peripheral neuropathy (HCC) Check feet at home daily, call with changes/concerns Followed closely by podiatry q73mEmphasized need for improved glucose control -   Primary osteoarthritis of left knee Followed by ortho  CKD (chronic kidney disease), stage II Increase fluids, avoid NSAIDS, monitor sugars, will monitor Routine urine with reflex  microscopic Microalbumin/creatinine urine ratio  RLS Continue meds, increase walking, monitor iron/B12  Morbid obesity (BMI 41+) Long discussion about weight loss, diet, and exercise Recommended diet heavy in fruits and veggies and low in animal meats, cheeses, and dairy products, appropriate calorie intake Discussed appropriate weight for height  Discussed restricting portions of starches, increase high fiber, information given Will start trulicity for glucose and weight benefits Follow up at next visit  Medication management CBC, CMP/GFR, magnesium  Idiopathic chronic gout without tophus, unspecified site Continue allopurinol Diet discussed Check uric acid as needed  Hx of prostate cancer S/p robotic excision, followed by Dr. HLouis Meckel Flomax for residual LUTS PSA  Flu Vaccine need High dose flu vaccine given  Vitamin D Deficiency Check Vitamin D level Continue supplementation to maintain level between 60-100  Screening for Ischemic heart disease EKG  Orders Placed This Encounter  Procedures   CBC with Differential/Platelet   COMPLETE METABOLIC PANEL WITH GFR   Magnesium   Lipid panel   TSH   Hemoglobin A1c   VITAMIN D 25 Hydroxy (Vit-D Deficiency, Fractures)   Urinalysis, Routine w reflex microscopic   Microalbumin / creatinine urine ratio   PSA   EKG 12-Lead      Future Appointments  Date Time Provider [place] 12/21/2020  9:15 AM HGarrel Ridgel DPM [facility]  03/04/2021 11:00 AM CLiane Comber NP [facility] None  11/29/2021 10:00 AM MMagda Bernheim NP [facility] None    HPI 65y.o. male  presents for CPE and 3 month follow up with hypertension, hyperlipidemia, diabetes and vitamin D deficiency.  He is married, 2 sons, no grandchildren. Enjoys his dog. He is semi retired mCompany secretary   He is on CPAP for OSA/obesity hypoventilation  syndrome and endorses 100% compliance and restorative sleep. Follow with Dr. Brett Fairy  He has imbalance  attributed to advanced peripheral neuropathy, no falls this year, does ambulate without cane if needed. Son drives him when needed.  He follows with Dr. [name] for back pain. Follows with podiatry Dr. [name] for diabetic neuropathy.   He has chronic low back pain.  Follows with Dr. Mitzi Hansen and was receiving injections but states they have not been helping. Continue to use Meloxicam and Robaxin irregularly with some relief  He is on sinemet BID for RLS. Also takes gabapentin 600 mg TID PRN.   He is taking flomax in the AM per urology for sense of incomplete bladder emptying; hx of prostate cancer and robotic excision in 2014 and followed by Dr. [name] at [facility]. PSAs have remained <0.1.   he has a diagnosis of GERD which is currently well managed by pantoprazole 40 mg  BMI is Body mass index is 39.17 kg/m., he has not been working on diet and exercise due back/joint pain.  Wt Readings from Last 3 Encounters:  11/29/20 (!) 301 lb (136.5 kg)  10/18/20 (!) 309 lb (140.2 kg)  08/25/20 (!) 306 lb 9.6 oz (139.1 kg)   His blood pressure has been controlled at home, today their BP is BP: 132/82 BP Readings from Last 3 Encounters:  11/29/20 132/82  10/18/20 (!) 172/85  08/25/20 118/80   He does workout, walks daily with dog. He denies chest pain, shortness of breath, dizziness.  His right hand does have aching/tight and difficulty bending fingers.   He is on cholesterol medication, on rosuvastatin 5 mg every other day denies myalgias. His cholesterol is not at goal. The cholesterol last visit was:   Lab Results  Component Value Date   CHOL 103 07/22/2020   HDL 42 07/22/2020   LDLCALC 36 07/22/2020   TRIG 176 (H) 07/22/2020   CHOLHDL 2.5 07/22/2020    He has been working on diet  for Type 2 diabetes on metformin 2000 mg daily, recently started on trulicity 3 mg weekly, and denies foot ulcerations, increased appetite, nausea, polydipsia, polyuria, visual disturbances, vomiting  and weight loss.  Neuropathy follows with podiatry, on Gabapentin 600 mg BID Diabetic eye - scheduled next month with Dr. Einar Gip. Cataracts both eye removed this year. He has been checking fasting glucose, log demonstrates improved from 180-200 to 140-160s, spikes if he snacks late in the evening  Last A1C in the office was:  Lab Results  Component Value Date   HGBA1C 7.3 (H) 06/16/2020   He has CKD II associated with T2DM monitored at this office. On enalapril. Last GFR:  Lab Results  Component Value Date   GFRNONAA >60 08/16/2020   Patient is on Vitamin D supplement, taking 50000 IU every other day    Lab Results  Component Value Date   VD25OH 90 06/16/2020     Patient is on allopurinol for gout and does not report a recent flare.  Lab Results  Component Value Date   LABURIC 5.6 06/16/2020   Last PSA:  Lab Results  Component Value Date   PSA <0.04 11/27/2019   PSA <0.1 11/26/2018   PSA <0.1 11/22/2016      Current Outpatient Medications  Medication Instructions   Accu-Chek Softclix Lancets lancets USE WITH METER TO CHECK  BLOOD SUGAR ONCE DAILY   acetaminophen (TYLENOL) 500 mg, Oral, Every 6 hours PRN   acyclovir (ZOVIRAX) 400 MG tablet TAKE 1 TABLET BY  MOUTH  DAILY AS NEEDED FOR COLD  SORES   aspirin 81 mg, Oral, Daily   atorvastatin (LIPITOR) 20 MG tablet TAKE 1 TABLET BY MOUTH  DAILY   azithromycin (ZITHROMAX) 250 MG tablet Take 2 tablets (500 mg) on  Day 1,  followed by 1 tablet (250 mg) once daily on Days 2 through 5.   benzonatate (TESSALON PERLES) 200 mg, Oral, 3 times daily PRN   bisoprolol (ZEBETA) 10 MG tablet Take  1 tablet  every Morning  for BP   Blood Glucose Monitoring Suppl (ONE TOUCH ULTRA 2) w/Device KIT SMARTSIG:Via Meter   carbidopa-levodopa (SINEMET IR) 25-250 MG tablet TAKE 1 TABLET BY MOUTH  TWICE DAILY FOR RESTLESS  LEGS   dextromethorphan-guaiFENesin (MUCINEX DM) 30-600 MG 12hr tablet 1 tablet, Oral, 2 times daily PRN   DULoxetine  (CYMBALTA) 60 MG capsule TAKE 1 CAPSULE BY MOUTH AT  BEDTIME   gabapentin (NEURONTIN) 600 MG tablet TAKE 1/2 TO 1 TABLET BY  MOUTH 2 TO 3 TIMES DAILY AS NEEDED FOR PAIN   ipratropium (ATROVENT HFA) 17 MCG/ACT inhaler 2 puffs, Inhalation, 4 times daily   Magnesium 1,500 mg, Oral, Every evening   meloxicam (MOBIC) 7.5 MG tablet Take 1 tablet Daily  with Food  is needed for Pain   metFORMIN (GLUCOPHAGE-XR) 500 MG 24 hr tablet Take  2 tablets  2 x /day  with Meals for Diabetes   methocarbamol (ROBAXIN) 500 mg, Oral, Daily PRN   Multiple Vitamin (MULTIVITAMIN) tablet 1 tablet, Oral, Daily   olmesartan (BENICAR) 40 MG tablet Take  1 tablet  every Night  for BP & Diabetic Kidney Protection   ONETOUCH ULTRA test strip USE TO CHECK BLOOD SUGAR  DAILY   pantoprazole (PROTONIX) 40 MG tablet Take  1 tablet  Daily  To Prevent  Heartburn & Indigestion   sennosides-docusate sodium (SENOKOT-S) 8.6-50 MG tablet 1 tablet, Oral, Daily   tamsulosin (FLOMAX) 0.4 MG CAPS capsule TAKE 1 CAPSULE BY MOUTH AT  BEDTIME FOR PROSTATE   Trulicity 3 mg, Injection, Weekly   Vitamin D (Ergocalciferol) (DRISDOL) 50,000 Units, Oral, As directed, Tuesday , Wednesday, Thursday, Sunday     Medical History:  Past Medical History:  Diagnosis Date   Acute meniscal tear of left knee    Allergic rhinitis    Arthritis     back   Borderline glaucoma    Chronic back pain    stenosis   CKD (chronic kidney disease), stage II    Complication of anesthesia cervical fusion surgery 06/2010   "woke up next day w/ventilator on" told due to OSA   GERD (gastroesophageal reflux disease)    takes Prevacid daily   Gout    takes Allopurinol daily and Colchicine if needed   History of adenomatous polyp of colon    tubular adenoma's   History of Barrett's esophagus    History of hiatal hernia    Hyperlipidemia    Hypertension    Incomplete right bundle branch block (RBBB)    OA (osteoarthritis)    knee   OSA on CPAP    Peripheral  neuropathy    Prostate cancer [facility]) urologist-  dr Alinda Money   dx 2013  s/p  radical non-nerve sparing prostatectomy 2014--  Stage pT2cNx,  PSA 3.48,  Gleason 3+3,  vol 55cc/  current PSA  0.01 (Aug 2016)   Rupture of Achilles tendon    Thyroid nodule    left side   Type II diabetes mellitus ([place])  Urinary frequency    takes Flomax daily   Urine incontinence    Wears glasses    Allergies Allergies  Allergen Reactions   Fenofibrate Itching and Rash   Flaxseed Oil Rash   Flaxseed [Linseed Oil] Rash   Lyrica [Pregabalin] Itching and Rash   Niaspan [Niacin Er] Rash   Preventative Medicine Immunization History  Administered Date(s) Administered   Influenza Inj Mdck Quad With Preservative 11/22/2016, 12/04/2017   Influenza Split 10/17/2013, 10/29/2014   Influenza, High Dose Seasonal PF 11/26/2018, 11/27/2019   Influenza, Seasonal, Injecte, Preservative Fre 11/29/2015   Influenza-Unspecified 11/04/2012   PFIZER(Purple Top)SARS-COV-2 Vaccination 03/10/2019, 03/31/2019, 11/06/2019   PPD Test 01/16/2006, 04/07/2013   Pneumococcal Conjugate-13 07/29/2018   Pneumococcal Polysaccharide-23 04/07/2013, 11/22/2016   Pneumococcal-Unspecified 09/17/2010   Td 01/17/2011   Zoster, Live 01/16/2005   Colonoscopy 2016 - Dr. Cristina Gong - due 09/2019, per patient met early 2021 and was advised ok to postpone to next year, due 2022 EGD 2013 Echo 02/2018, EF 55-60%  Tetanus: 2013 Influenza: 11/2018, TODAY  Pneumonia: 2015, 2018 Prevnar 13: 07/2018 Shingles: 2007 Covid 19: 3/3, 2021, pfizer  Vision: Dr. Einar Gip, 01/03/2019, report verified and abstracted ,has scheduled next month  Dental: Dr. Gloriann Loan, 10/2019, goes q69m Patient Care Team: MUnk Pinto MD as PCP - General (Internal Medicine) BLorretta Harp MD as PCP - Cardiology (Cardiology) HGarrel Ridgel DPM as Consulting Physician (Podiatry) MWebb Laws [place] Referring Physician (Optometry) JMartinique Peter M, MD as Consulting  Physician (Cardiology) BRonald Lobo MD as Consulting Physician (Gastroenterology) BSusa Day MD as Consulting Physician (Orthopedic Surgery) HArdis Hughs MD as Attending Physician (Urology)   SURGICAL HISTORY He  has a past surgical history that includes Posterior fusion cervical spine (07-05-2010); Esophagogastroduodenoscopy (09/26/2011); Robot assisted laparoscopic radical prostatectomy (02/07/2012); wisdom teeth extracted  (1982); left and right heart catheterization with coronary angiogram (N/A, 08/25/2011); Anal fissure repair (06-05-2002); Cardiovascular stress test (01-01-2008); transthoracic echocardiogram (08-03-2011); Posterior lumbar fusion (05-18-2014); Uvulopalatopharyngoplasty (uppp)/tonsillectomy/septoplasty (1990's); Knee arthroscopy with medial menisectomy (Left, 12/31/2014); Cardiac catheterization; Back surgery; Total knee arthroplasty (Left, 08/05/2015); Gastroc recession extremity (Left, 06/27/2017); Achilles tendon surgery (Left, 06/27/2017); Tendon transfer (Left, 06/27/2017); Carpal tunnel release (Left, 03/27/2018); and Carpal tunnel release (Right, 06/2018). FAMILY HISTORY His family history includes Cancer in his father; Dementia in his mother; Heart attack in his maternal grandmother; Hyperlipidemia in his sister; Hypertension in his mother, paternal grandfather, and sister; Osteoarthritis in his mother; Osteoporosis in his mother; Sleep apnea in his son and son; Stroke in his maternal grandmother. SOCIAL HISTORY He  reports that he has never smoked. He has never used smokeless tobacco. He reports that he does not drink alcohol and does not use drugs.  Depression/mood screen:   [facility]  Decreased Interest 0  Down, Depressed, Hopeless 0  PHQ - 2 Score 0  Altered sleeping -  Tired, decreased energy -  Change in appetite -  Feeling bad or failure about yourself  -  Trouble concentrating -  Moving slowly or fidgety/restless -   Suicidal thoughts -  PHQ-9 Score -  Difficult doing work/chores -  Some recent data might be hidden      Review of Systems:  Review of Systems  Constitutional:  Negative for chills, diaphoresis, fever, malaise/fatigue and weight loss.  HENT:  Negative for congestion, ear pain, hearing loss, sore throat and tinnitus.   Eyes: Negative.  Negative for blurred vision and double vision.  Respiratory:  Positive for shortness of breath (on  exertion). Negative for cough, sputum production and wheezing.   Cardiovascular:  Negative for chest pain, palpitations, orthopnea, claudication, leg swelling and PND.  Gastrointestinal:  Negative for abdominal pain, blood in stool, constipation, diarrhea, heartburn, melena, nausea and vomiting.  Genitourinary:  Positive for urgency.  Musculoskeletal:  Positive for back pain and joint pain (right hand). Negative for falls and myalgias.  Skin: Negative.  Negative for rash.  Neurological:  Positive for tingling (bil fingers) and sensory change (chronic numbness of bil feet). Negative for dizziness, loss of consciousness, weakness and headaches.  Endo/Heme/Allergies:  Negative for polydipsia.  Psychiatric/Behavioral: Negative.  Negative for depression, memory loss, substance abuse and suicidal ideas. The patient is not nervous/anxious and does not have insomnia.   All other systems reviewed and are negative.  Physical Exam: BP 132/82   Pulse 68   Temp (!) 96.8 F (36 C)   Ht 6' 1.5" (1.867 m)   Wt (!) 301 lb (136.5 kg)   SpO2 97%   BMI 39.17 kg/m  Wt Readings from Last 3 Encounters:  11/29/20 (!) 301 lb (136.5 kg)  10/18/20 (!) 309 lb (140.2 kg)  08/25/20 (!) 306 lb 9.6 oz (139.1 kg)   General Appearance: Pleasant obese male, non-toxic appearing, in no apparent distress. Eyes: PERRLA, EOMs, conjunctiva no swelling or erythema ENT/Mouth: Ear canals clear with no erythema, swelling, or discharge.  TMs normal bilaterally, oropharynx clear, moist, with  no exudate.   Neck: Supple, thyroid normal, no JVD, no cervical adenopathy.  Respiratory: Respiratory effort normal, breath sounds clear A&P, no wheeze, rhonchi or rales noted.  No retractions, no accessory muscle usage Cardio: RRR with no MRGs. No edema noted Abdomen: Soft, + BS, morbidly obese abdomen limiting exam.  Non tender, no guarding, rebound, hernias, he has mild ventral hernia with bearing down. Musculoskeletal: Full ROM, 5/5 strength, antalgic gait, uses cane Skin: Warm, dry without rashes, lesions, ecchymosis. Calluses to bilateral feet without ulcers or wounds. Neuro: Awake and oriented X 3, Cranial nerves intact. No cerebellar symptoms. Decreased sensation to feet bilaterally  Psych: normal affect, Insight and Judgment appropriate.  GU: defer to urology, declines today   EKG: NO ST Changes, no significant changes since last tracing  Had essentially normal ECHO 03/06/2018- mildly increased left ventricular wall only    Laine Fonner Kathyrn Drown, NP 10:07 AM [facility]

## 2020-11-26 DIAGNOSIS — G4733 Obstructive sleep apnea (adult) (pediatric): Secondary | ICD-10-CM | POA: Diagnosis not present

## 2020-11-29 ENCOUNTER — Encounter: Payer: Medicare Other | Admitting: Adult Health

## 2020-11-29 ENCOUNTER — Encounter: Payer: Self-pay | Admitting: Nurse Practitioner

## 2020-11-29 ENCOUNTER — Other Ambulatory Visit: Payer: Self-pay

## 2020-11-29 ENCOUNTER — Ambulatory Visit (INDEPENDENT_AMBULATORY_CARE_PROVIDER_SITE_OTHER): Payer: Medicare Other | Admitting: Nurse Practitioner

## 2020-11-29 VITALS — BP 132/82 | HR 68 | Temp 96.8°F | Ht 73.5 in | Wt 301.0 lb

## 2020-11-29 DIAGNOSIS — N182 Chronic kidney disease, stage 2 (mild): Secondary | ICD-10-CM

## 2020-11-29 DIAGNOSIS — G8929 Other chronic pain: Secondary | ICD-10-CM

## 2020-11-29 DIAGNOSIS — E662 Morbid (severe) obesity with alveolar hypoventilation: Secondary | ICD-10-CM

## 2020-11-29 DIAGNOSIS — E559 Vitamin D deficiency, unspecified: Secondary | ICD-10-CM

## 2020-11-29 DIAGNOSIS — I10 Essential (primary) hypertension: Secondary | ICD-10-CM | POA: Diagnosis not present

## 2020-11-29 DIAGNOSIS — M1A00X0 Idiopathic chronic gout, unspecified site, without tophus (tophi): Secondary | ICD-10-CM

## 2020-11-29 DIAGNOSIS — G4733 Obstructive sleep apnea (adult) (pediatric): Secondary | ICD-10-CM

## 2020-11-29 DIAGNOSIS — G2581 Restless legs syndrome: Secondary | ICD-10-CM

## 2020-11-29 DIAGNOSIS — Z0000 Encounter for general adult medical examination without abnormal findings: Secondary | ICD-10-CM

## 2020-11-29 DIAGNOSIS — Z8546 Personal history of malignant neoplasm of prostate: Secondary | ICD-10-CM

## 2020-11-29 DIAGNOSIS — E1142 Type 2 diabetes mellitus with diabetic polyneuropathy: Secondary | ICD-10-CM

## 2020-11-29 DIAGNOSIS — E1122 Type 2 diabetes mellitus with diabetic chronic kidney disease: Secondary | ICD-10-CM | POA: Diagnosis not present

## 2020-11-29 DIAGNOSIS — E785 Hyperlipidemia, unspecified: Secondary | ICD-10-CM

## 2020-11-29 DIAGNOSIS — Z9989 Dependence on other enabling machines and devices: Secondary | ICD-10-CM

## 2020-11-29 DIAGNOSIS — E1169 Type 2 diabetes mellitus with other specified complication: Secondary | ICD-10-CM

## 2020-11-29 DIAGNOSIS — Z79899 Other long term (current) drug therapy: Secondary | ICD-10-CM

## 2020-11-29 DIAGNOSIS — Z136 Encounter for screening for cardiovascular disorders: Secondary | ICD-10-CM

## 2020-11-29 DIAGNOSIS — Z0001 Encounter for general adult medical examination with abnormal findings: Secondary | ICD-10-CM

## 2020-11-29 DIAGNOSIS — Z23 Encounter for immunization: Secondary | ICD-10-CM | POA: Diagnosis not present

## 2020-11-29 DIAGNOSIS — I70209 Unspecified atherosclerosis of native arteries of extremities, unspecified extremity: Secondary | ICD-10-CM

## 2020-11-29 DIAGNOSIS — M1712 Unilateral primary osteoarthritis, left knee: Secondary | ICD-10-CM

## 2020-11-29 MED ORDER — METHOCARBAMOL 500 MG PO TABS: 500.0000 mg | ORAL_TABLET | Freq: Every day | ORAL | 2 refills | Status: DC | PRN

## 2020-11-29 MED ORDER — MELOXICAM 7.5 MG PO TABS: ORAL_TABLET | ORAL | 2 refills | Status: AC

## 2020-11-29 NOTE — Patient Instructions (Signed)

## 2020-11-30 LAB — HEMOGLOBIN A1C
Hgb A1c MFr Bld: 7.1 % of total Hgb — ABNORMAL HIGH (ref <5.7)
Mean Plasma Glucose: 157 mg/dL
eAG (mmol/L): 8.7 mmol/L

## 2020-11-30 LAB — COMPLETE METABOLIC PANEL WITH GFR
AG Ratio: 1.9 (calc) (ref 1.0–2.5)
ALT: 19 U/L (ref 9–46)
AST: 15 U/L (ref 10–35)
Albumin: 4.5 g/dL (ref 3.6–5.1)
Alkaline phosphatase (APISO): 71 U/L (ref 35–144)
BUN: 15 mg/dL (ref 7–25)
CO2: 27 mmol/L (ref 20–32)
Calcium: 10.6 mg/dL — ABNORMAL HIGH (ref 8.6–10.3)
Chloride: 103 mmol/L (ref 98–110)
Creat: 0.93 mg/dL (ref 0.70–1.35)
Globulin: 2.4 g/dL (calc) (ref 1.9–3.7)
Glucose, Bld: 137 mg/dL — ABNORMAL HIGH (ref 65–99)
Potassium: 4.8 mmol/L (ref 3.5–5.3)
Sodium: 141 mmol/L (ref 135–146)
Total Bilirubin: 0.5 mg/dL (ref 0.2–1.2)
Total Protein: 6.9 g/dL (ref 6.1–8.1)
eGFR: 90 mL/min/{1.73_m2} (ref >=60)

## 2020-11-30 LAB — URINALYSIS, ROUTINE W REFLEX MICROSCOPIC
Bilirubin Urine: NEGATIVE
Glucose, UA: NEGATIVE
Hgb urine dipstick: NEGATIVE
Leukocytes,Ua: NEGATIVE
Nitrite: NEGATIVE
Specific Gravity, Urine: 1.022 (ref 1.001–1.035)
pH: 6 (ref 5.0–8.0)

## 2020-11-30 LAB — CBC WITH DIFFERENTIAL/PLATELET
Absolute Monocytes: 538 cells/uL (ref 200–950)
Basophils Absolute: 83 cells/uL (ref 0–200)
Basophils Relative: 1.2 %
Eosinophils Absolute: 97 cells/uL (ref 15–500)
Eosinophils Relative: 1.4 %
HCT: 40.1 % (ref 38.5–50.0)
Hemoglobin: 13.8 g/dL (ref 13.2–17.1)
Lymphs Abs: 1463 cells/uL (ref 850–3900)
MCH: 30.9 pg (ref 27.0–33.0)
MCHC: 34.4 g/dL (ref 32.0–36.0)
MCV: 89.9 fL (ref 80.0–100.0)
MPV: 9.8 fL (ref 7.5–12.5)
Monocytes Relative: 7.8 %
Neutro Abs: 4720 cells/uL (ref 1500–7800)
Neutrophils Relative %: 68.4 %
Platelets: 275 10*3/uL (ref 140–400)
RBC: 4.46 10*6/uL (ref 4.20–5.80)
RDW: 14.5 % (ref 11.0–15.0)
Total Lymphocyte: 21.2 %
WBC: 6.9 10*3/uL (ref 3.8–10.8)

## 2020-11-30 LAB — LIPID PANEL
Cholesterol: 99 mg/dL (ref <200)
HDL: 35 mg/dL — ABNORMAL LOW (ref >=40)
LDL Cholesterol (Calc): 35 mg/dL (calc)
Non-HDL Cholesterol (Calc): 64 mg/dL (calc) (ref <130)
Total CHOL/HDL Ratio: 2.8 (calc) (ref <5.0)
Triglycerides: 236 mg/dL — ABNORMAL HIGH (ref <150)

## 2020-11-30 LAB — MICROALBUMIN / CREATININE URINE RATIO
Creatinine, Urine: 116 mg/dL (ref 20–320)
Microalb Creat Ratio: 141 mcg/mg creat — ABNORMAL HIGH (ref <30)
Microalb, Ur: 16.4 mg/dL

## 2020-11-30 LAB — MICROSCOPIC MESSAGE

## 2020-11-30 LAB — TSH: TSH: 3.01 mIU/L (ref 0.40–4.50)

## 2020-11-30 LAB — PSA: PSA: <0.04 ng/mL (ref <=4.00)

## 2020-11-30 LAB — MAGNESIUM: Magnesium: 1.7 mg/dL (ref 1.5–2.5)

## 2020-11-30 LAB — VITAMIN D 25 HYDROXY (VIT D DEFICIENCY, FRACTURES): Vit D, 25-Hydroxy: 100 ng/mL (ref 30–100)

## 2020-12-17 DIAGNOSIS — Z961 Presence of intraocular lens: Secondary | ICD-10-CM | POA: Diagnosis not present

## 2020-12-17 DIAGNOSIS — E119 Type 2 diabetes mellitus without complications: Secondary | ICD-10-CM | POA: Diagnosis not present

## 2020-12-17 LAB — HM DIABETES EYE EXAM

## 2020-12-21 ENCOUNTER — Ambulatory Visit: Payer: Medicare Other | Admitting: Podiatry

## 2020-12-21 ENCOUNTER — Other Ambulatory Visit: Payer: Self-pay

## 2020-12-21 DIAGNOSIS — E1129 Type 2 diabetes mellitus with other diabetic kidney complication: Secondary | ICD-10-CM

## 2020-12-21 MED ORDER — TRULICITY 3 MG/0.5ML ~~LOC~~ SOAJ: 3.0000 mg | SUBCUTANEOUS | 3 refills | Status: DC

## 2020-12-26 DIAGNOSIS — G4733 Obstructive sleep apnea (adult) (pediatric): Secondary | ICD-10-CM | POA: Diagnosis not present

## 2020-12-28 ENCOUNTER — Ambulatory Visit: Payer: Medicare Other | Admitting: Podiatry

## 2021-01-03 DIAGNOSIS — M791 Myalgia, unspecified site: Secondary | ICD-10-CM | POA: Diagnosis not present

## 2021-01-03 DIAGNOSIS — M4326 Fusion of spine, lumbar region: Secondary | ICD-10-CM | POA: Diagnosis not present

## 2021-01-18 ENCOUNTER — Ambulatory Visit: Payer: Medicare Other | Admitting: Podiatry

## 2021-01-18 ENCOUNTER — Other Ambulatory Visit: Payer: Self-pay

## 2021-01-18 ENCOUNTER — Encounter: Payer: Self-pay | Admitting: Podiatry

## 2021-01-18 DIAGNOSIS — D2372 Other benign neoplasm of skin of left lower limb, including hip: Secondary | ICD-10-CM

## 2021-01-18 DIAGNOSIS — M79676 Pain in unspecified toe(s): Secondary | ICD-10-CM | POA: Diagnosis not present

## 2021-01-18 DIAGNOSIS — E1142 Type 2 diabetes mellitus with diabetic polyneuropathy: Secondary | ICD-10-CM

## 2021-01-18 DIAGNOSIS — B351 Tinea unguium: Secondary | ICD-10-CM

## 2021-01-18 DIAGNOSIS — D2371 Other benign neoplasm of skin of right lower limb, including hip: Secondary | ICD-10-CM

## 2021-01-18 NOTE — Progress Notes (Signed)
He presents today for chief complaint of painful elongated toenails diabetes and calluses.  Diabetic peripheral neuropathy states has had no problems.  Objective: Vital signs are stable he is alert oriented x3 toenails are long thick yellow dystrophic with mycotic distal clavi are healing after tenotomies being performed.  No ulcerations noted toenails are long thick yellow dystrophic Lee mycotic.  Assessment: Pain limb secondary to onychomycosis diabetic peripheral neuropathy and benign skin lesions.  Plan: Debridement of benign skin lesions. Debridement of toenails 1 through 5 bilateral.

## 2021-01-19 ENCOUNTER — Other Ambulatory Visit: Payer: Self-pay | Admitting: Nurse Practitioner

## 2021-01-19 DIAGNOSIS — E1122 Type 2 diabetes mellitus with diabetic chronic kidney disease: Secondary | ICD-10-CM

## 2021-01-19 DIAGNOSIS — I10 Essential (primary) hypertension: Secondary | ICD-10-CM

## 2021-01-20 ENCOUNTER — Other Ambulatory Visit: Payer: Self-pay | Admitting: Nurse Practitioner

## 2021-01-20 DIAGNOSIS — I10 Essential (primary) hypertension: Secondary | ICD-10-CM

## 2021-01-20 DIAGNOSIS — N182 Chronic kidney disease, stage 2 (mild): Secondary | ICD-10-CM

## 2021-01-20 DIAGNOSIS — E1122 Type 2 diabetes mellitus with diabetic chronic kidney disease: Secondary | ICD-10-CM

## 2021-01-24 DIAGNOSIS — G4733 Obstructive sleep apnea (adult) (pediatric): Secondary | ICD-10-CM | POA: Diagnosis not present

## 2021-01-26 DIAGNOSIS — G4733 Obstructive sleep apnea (adult) (pediatric): Secondary | ICD-10-CM | POA: Diagnosis not present

## 2021-02-10 ENCOUNTER — Other Ambulatory Visit: Payer: Self-pay | Admitting: Adult Health

## 2021-02-26 DIAGNOSIS — G4733 Obstructive sleep apnea (adult) (pediatric): Secondary | ICD-10-CM | POA: Diagnosis not present

## 2021-03-04 ENCOUNTER — Ambulatory Visit: Payer: Medicare Other | Admitting: Adult Health

## 2021-03-08 NOTE — Progress Notes (Signed)
Patient ID: Jason Wyatt, male   DOB: 05/29/1955, 65 y.o.   MRN: [id number] ° °3 month follow up and Medicare wellness ° °Assessment and Plan ° °Diagnoses and all orders for this visit: ° °Encounter for annual medicare wellness  °Due Yearly ° °Essential hypertension °Continue medication °Monitor blood pressure at home more closely; call if consistently over 130/80 °Continue DASH diet.   °Reminder to go to the ER if any CP, SOB, nausea, dizziness, severe HA, changes vision/speech, left arm numbness and tingling and jaw pain. ° °Obesity hypoventilation syndrome °Continue CPAP, weight loss encouraged ° °Type 2 diabetes mellitus with CKD (HCC) °Education: Reviewed ‘ABCs’ of diabetes management (respective goals in parentheses):  A1C (<7), blood pressure (<130/80), and cholesterol (LDL <70) °Eye Exam yearly and Dental Exam every 6 months. °Dietary recommendations °Physical Activity recommendations °Persistently above goal with Jason dose metformin; now on trulicity, tolerating well, titrate up from 1.5 mg to 3 mg weekly;  °- check lipid panel °- A1c ° °CKD II associated with T2DM (HCC) °Increase fluids, avoid NSAIDS, control sugars and blood pressure, will monitor °- CMP/GFR ° °Hyperlipidemia associated with T2DM (HCC) °Continue medications; at LDL goal <70 °Continue low cholesterol diet and exercise.  °Check lipid panel.  ° °DM type 2 with diabetic peripheral neuropathy (HCC) °Check feet at home daily, call with changes/concerns °Followed closely by podiatry q3m °Emphasized need for improved glucose control -  ° °Primary osteoarthritis of left knee °Followed by ortho ° °CKD (chronic kidney disease), stage II °Increase fluids, avoid NSAIDS, monitor sugars, will monitor ° °RLS °Continue meds, increase walking, monitor iron/B12 ° °Morbid obesity (BMI 41+) °Long discussion about weight loss, diet, and exercise °Recommended diet heavy in fruits and veggies and low in animal meats, cheeses, and dairy products, appropriate  calorie intake °Discussed appropriate weight for height  °Discussed restricting portions of starches, increase high fiber, information given °Will start trulicity for glucose and weight benefits °Follow up at next visit ° °Medication management °CBC, CMP/GFR ° °Idiopathic chronic gout without tophus, unspecified site °Continue allopurinol °Diet discussed °Check uric acid as needed ° °Hx of prostate cancer °S/p robotic excision, followed by Jason Wyatt, Flomax for residual LUTS ° °Gait instability °R/Wyatt neuropathy; denies falls; encouraged cane if needed °Can refer to PT if getting worse; declines at this time ° °Left arm laceration °Keep clean and dry °- Td vaccine given ° ° °Future Appointments  °Date Time Provider Department Center  °05/03/2021  8:15 AM Jason Wyatt, DPM [facility]  °11/29/2021 10:00 AM Jason Wyatt, Jason W, NP [facility] None  °03/10/2022  9:00 AM Jason Wyatt, Jason W, NP [facility] None  ° ° ° °Plan:  ° °During the course of the visit the patient was educated and counseled about appropriate screening and preventive services including:  ° °Pneumococcal vaccine  °Prevnar 13 °Influenza vaccine °Td vaccine °Screening electrocardiogram °Bone densitometry screening °Colorectal cancer screening °Diabetes screening °Glaucoma screening °Nutrition counseling  °Advanced directives: requested ° ° ° °HPI °BP (!) 142/78    Pulse 68    Temp (!) 97.5 °F (36.4 °C)    Wt (!) 312 lb 12.8 oz (141.9 kg)    SpO2 96%    BMI 40.71 kg/m²  ° °65 y.o. male  presents for AWV and 3 month follow up with hypertension, hyperlipidemia, diabetes and vitamin D deficiency. He has CKD stage 2 due to type 2 diabetes mellitus (HCC); Morbid obesity (HCC); Obesity hypoventilation syndrome (HCC); T2_NIDDM Wyatt/ CKD 2 ; Gout; Hyperlipidemia associated with type   2 diabetes mellitus ([place]); Hypertension; RLS; Medication management; DM type 2 with diabetic peripheral neuropathy ([place]); Normal coronary arteries; Primary osteoarthritis of left knee; Gait  instability; Bilateral carpal tunnel syndrome; Heart rate fast; History of prostate cancer; Iron deficiency anemia; Cubital tunnel syndrome; Obesity with alveolar hypoventilation, unspecified obesity severity (HCC); CPAP (continuous positive airway pressure) dependence; Other insomnia; S/P UPPP (uvulopalatopharyngoplasty); Shortness of breath; OSA on CPAP; and Severe obstructive sleep apnea on their problem list.  He is married, 2 sons, no grandchildren. Enjoys his dog. He is semi retired Company secretary. His wife had recent V Tach sustained and MI with resultant pacemaker placed, currently in [place]   He has a small laceration on his right arm, uncertain of cause.  No S/S of infection.   He is on CPAP for OSA/obesity hypoventilation syndrome and endorses 100% compliance and restorative sleep.   He has imbalance attributed to advanced peripheral neuropathy, no falls this year, does ambulate without cane if needed. Son drives him when needed. He follows with Jason Wyatt for back pain. Follows with podiatry Jason Wyatt for diabetic neuropathy.  He is on sinemet BID for RLS. Also takes gabapentin 600 mg TID PRN.   He is taking flomax in the AM per urology for sense of incomplete bladder emptying; hx of prostate cancer and robotic excision in 2014 and followed by Jason Wyatt at [facility]. PSAs have remained <0.1.   he has a diagnosis of GERD which is currently well managed by pantoprazole 40 mg  BMI is Body mass index is 40.71 kg/m., he has not been working on diet and exercise due back/joint pain. Admits he eats potatoes daily, knows he needs to do better with diet.    Wt Readings from Last 3 Encounters:  03/10/21 (!) 312 lb 12.8 oz (141.9 kg)  11/29/20 (!) 301 lb (136.5 kg)  10/18/20 (!) 309 lb (140.2 kg)   His blood pressure has been controlled at home, today their BP is BP: (!) 142/78  BP Readings from Last 3 Encounters:  03/10/21 (!) 142/78  11/29/20 132/82  10/18/20 (!)  172/85     He does workout, walks daily with dog. He denies chest pain, shortness of breath, dizziness.   He is on cholesterol medication, on Atorvastatin 20 mg every other day denies myalgias. His cholesterol is not at goal. The cholesterol last visit was:   Lab Results  Component Value Date   CHOL 99 11/29/2020   HDL 35 (L) 11/29/2020   LDLCALC 35 11/29/2020   TRIG 236 (H) 11/29/2020   CHOLHDL 2.8 11/29/2020    He has been working on diet  for Type 2 diabetes on metformin 2000 mg daily, recently started on trulicity 3 mg weekly, and denies foot ulcerations, increased appetite, nausea, polydipsia, polyuria, visual disturbances, vomiting and weight loss.  Neuropathy follows with podiatry, on 600 mg BID Diabetic eye -  Dr. Einar Gip 12/2021 He has been checking fasting glucose, log demonstrates improved from 130-180 Last A1C in the office was:  Lab Results  Component Value Date   HGBA1C 7.1 (H) 11/29/2020   He has CKD II associated with T2DM monitored at this office. On enalapril. Last GFR:  Lab Results  Component Value Date   GFRNONAA >60 08/16/2020   Patient is on Vitamin D supplement, taking 50000 IU every other day    Lab Results  Component Value Date   VD25OH 100 11/29/2020     Patient is on allopurinol for gout and does not  report a recent flare.  Lab Results  Component Value Date   LABURIC 5.6 06/16/2020      Current Outpatient Medications  Medication Instructions   Accu-Chek Softclix Lancets lancets USE WITH METER TO CHECK  BLOOD SUGAR ONCE DAILY   acetaminophen (TYLENOL) 500 mg, Oral, Every 6 hours PRN   acyclovir (ZOVIRAX) 400 MG tablet TAKE 1 TABLET BY MOUTH  DAILY AS NEEDED FOR COLD  SORES   allopurinol (ZYLOPRIM) 300 mg, Oral, Daily   aspirin 81 mg, Oral, Daily   atorvastatin (LIPITOR) 20 MG tablet TAKE 1 TABLET BY MOUTH  DAILY   bisoprolol (ZEBETA) 10 MG tablet TAKE 1 TABLET BY MOUTH IN  THE MORNING FOR BLOOD  PRESSURE   Blood Glucose Monitoring Suppl  (ONE TOUCH ULTRA 2) Wyatt/Device KIT SMARTSIG:Via Meter   carbidopa-levodopa (SINEMET IR) 25-250 MG tablet TAKE 1 TABLET BY MOUTH  TWICE DAILY FOR RESTLESS  LEGS   dextromethorphan-guaiFENesin (MUCINEX DM) 30-600 MG 12hr tablet 1 tablet, Oral, 2 times daily PRN   DULoxetine (CYMBALTA) 60 MG capsule TAKE 1 CAPSULE BY MOUTH AT  BEDTIME   gabapentin (NEURONTIN) 600 MG tablet TAKE 1/2 TO 1 TABLET BY  MOUTH 2 TO 3 TIMES DAILY AS NEEDED FOR PAIN   ipratropium (ATROVENT HFA) 17 MCG/ACT inhaler 2 puffs, Inhalation, 4 times daily   Magnesium 1,500 mg, Oral, Every evening   meloxicam (MOBIC) 7.5 MG tablet Take 1 tablet Daily  with Food  is needed for Pain   metFORMIN (GLUCOPHAGE-XR) 500 MG 24 hr tablet Take  2 tablets  2 x /day  with Meals for Diabetes   methocarbamol (ROBAXIN) 500 mg, Oral, Daily PRN   Multiple Vitamin (MULTIVITAMIN) tablet 1 tablet, Oral, Daily   olmesartan (BENICAR) 40 MG tablet TAKE 1 TABLET BY MOUTH  EVERY NIGHT FOR BLOOD  PRESSURE AND DIABETIC  KIDNEY PROTECTION   ONETOUCH ULTRA test strip USE TO CHECK BLOOD SUGAR  DAILY   pantoprazole (PROTONIX) 40 MG tablet Take  1 tablet  Daily  To Prevent  Heartburn & Indigestion   sennosides-docusate sodium (SENOKOT-S) 8.6-50 MG tablet 1 tablet, Daily   tamsulosin (FLOMAX) 0.4 MG CAPS capsule TAKE 1 CAPSULE BY MOUTH AT  BEDTIME FOR PROSTATE   Trulicity 3 mg, Injection, Weekly   Vitamin D (Ergocalciferol) (DRISDOL) 50,000 Units, Oral, As directed, Tuesday , Wednesday, Thursday, Sunday     Medical History:  Past Medical History:  Diagnosis Date   Acute meniscal tear of left knee    Allergic rhinitis    Arthritis     back   Borderline glaucoma    Chronic back pain    stenosis   CKD (chronic kidney disease), stage II    Complication of anesthesia cervical fusion surgery 06/2010   "woke up next day Wyatt/ventilator on" told due to OSA   GERD (gastroesophageal reflux disease)    takes Prevacid daily   Gout    takes Allopurinol daily and  Colchicine if needed   History of adenomatous polyp of colon    tubular adenoma's   History of Barrett's esophagus    History of hiatal hernia    Hyperlipidemia    Hypertension    Incomplete right bundle branch block (RBBB)    OA (osteoarthritis)    knee   OSA on CPAP    Peripheral neuropathy    Prostate cancer [facility]) urologist-  dr Alinda Money   dx 2013  s/p  radical non-nerve sparing prostatectomy 2014--  Stage pT2cNx,  PSA 3.48,  Gleason 3+3,  vol 55cc/  current PSA  0.01 (Aug 2016)  ° Rupture of Achilles tendon   ° Thyroid nodule   ° left side  ° Type II diabetes mellitus (HCC)   ° Urinary frequency   ° takes Flomax daily  ° Urine incontinence   ° Wears glasses   ° °Allergies °Allergies  °Allergen Reactions  ° Fenofibrate Itching and Rash  ° Flaxseed Oil Rash  ° Flaxseed [Linseed Oil] Rash  ° Lyrica [Pregabalin] Itching and Rash  ° Niaspan [Niacin Er] Rash  ° °Preventative Medicine °Immunization History  °Administered Date(s) Administered  ° Influenza Inj Mdck Quad With Preservative 11/22/2016, 12/04/2017  ° Influenza Split 10/17/2013, 10/29/2014  ° Influenza, High Dose Seasonal PF 11/26/2018, 11/27/2019, 11/29/2020  ° Influenza, Seasonal, Injecte, Preservative Fre 11/29/2015  ° Influenza-Unspecified 11/04/2012  ° PFIZER(Purple Top)SARS-COV-2 Vaccination 03/10/2019, 03/31/2019, 11/06/2019  ° PPD Test 01/16/2006, 04/07/2013  ° Pfizer Covid-19 Vaccine Bivalent Booster 12yrs & up 02/16/2021  ° Pneumococcal Conjugate-13 07/29/2018  ° Pneumococcal Polysaccharide-23 04/07/2013, 11/22/2016  ° Pneumococcal-Unspecified 09/17/2010  ° Td 01/17/2011  ° Zoster Recombinat (Shingrix) 02/16/2021  ° Zoster, Live 01/16/2005  ° ° °Colonoscopy 2016 - Dr. Buccini - due 09/2019, per patient met early 2021 and was advised ok to postpone to next year, due 09/2024 °EGD 2013 °Echo 02/2018, EF 55-60% ° °Tetanus: 03/10/21 °Influenza: 11/29/20 °Pneumonia: 2015, 2018 °Prevnar 13: 07/2018 °Shingles: 2007 °Covid 19: 3/3, 2021, pfizer 02/16/21  booster ° °Vision: Dr. McFarland, 12/2020,  °Dental: Dr. Bell, 10/2019, goes q6m,01/2021 ° °Patient Care Team: °McKeown, William, MD as PCP - General (Internal Medicine) °Berry, Jonathan J, MD as PCP - Cardiology (Cardiology) °Jason Wyatt, DPM as Consulting Physician (Podiatry) °McFarland, Howard, OD as Referring Physician (Optometry) °Jordan, Peter M, MD as Consulting Physician (Cardiology) °Buccini, Robert, MD as Consulting Physician (Gastroenterology) °Beane, Jeffrey, MD as Consulting Physician (Orthopedic Surgery) °Wyatt, Benjamin W, MD as Attending Physician (Urology) ° ° °SURGICAL HISTORY °He  has a past surgical history that includes Posterior fusion cervical spine (07-05-2010); Esophagogastroduodenoscopy (09/26/2011); Robot assisted laparoscopic radical prostatectomy (02/07/2012); wisdom teeth extracted  (1982); left and right heart catheterization with coronary angiogram (N/A, 08/25/2011); Anal fissure repair (06-05-2002); Cardiovascular stress test (01-01-2008); transthoracic echocardiogram (08-03-2011); Posterior lumbar fusion (05-18-2014); Uvulopalatopharyngoplasty (uppp)/tonsillectomy/septoplasty (1990's); Knee arthroscopy with medial menisectomy (Left, 12/31/2014); Cardiac catheterization; Back surgery; Total knee arthroplasty (Left, 08/05/2015); Gastroc recession extremity (Left, 06/27/2017); Achilles tendon surgery (Left, 06/27/2017); Tendon transfer (Left, 06/27/2017); Carpal tunnel release (Left, 03/27/2018); and Carpal tunnel release (Right, 06/2018). °FAMILY HISTORY °His family history includes Cancer in his father; Dementia in his mother; Heart attack in his maternal grandmother; Hyperlipidemia in his sister; Hypertension in his mother, paternal grandfather, and sister; Osteoarthritis in his mother; Osteoporosis in his mother; Sleep apnea in his son and son; Stroke in his maternal grandmother. °SOCIAL HISTORY °He  reports that he has never smoked. He has never used smokeless tobacco. He reports that  he does not drink alcohol and does not use drugs. ° °MEDICARE WELLNESS OBJECTIVES: °Physical activity: Current Exercise Habits: Home exercise routine, Type of exercise: walking, Time (Minutes): 30, Frequency (Times/Week): 7, Weekly Exercise (Minutes/Week): 210, Intensity: Mild, Exercise limited by: orthopedic condition(s) °Cardiac risk factors: Cardiac Risk Factors include: advanced age (>55men, >65 women);diabetes mellitus;dyslipidemia;hypertension;male gender;sedentary lifestyle;obesity (BMI >30kg/m2) °Depression/mood screen:   °Depression screen PHQ 2/9 03/10/2021  °Decreased Interest 0  °Down, Depressed, Hopeless 0  °PHQ - 2 Score 0  °Altered sleeping -  °Tired, decreased energy -  °Change in appetite -  °  Feeling bad or failure about yourself  -  °Trouble concentrating -  °Moving slowly or fidgety/restless -  °Suicidal thoughts -  °PHQ-9 Score -  °Difficult doing work/chores -  °Some recent data might be hidden  °  °ADLs:  °In your present state of health, do you have any difficulty performing the following activities: 03/10/2021 06/15/2020  °Hearing? N N  °Vision? N N  °Difficulty concentrating or making decisions? N N  °Walking or climbing stairs? Y N  °Dressing or bathing? N N  °Doing errands, shopping? N N  °Some recent data might be hidden  °  ° °Cognitive Testing ° Alert? Yes  Normal Appearance?Yes ° Oriented to person? Yes  Place? Yes °  Time? Yes ° Recall of three objects?  Yes ° Can perform simple calculations? Yes ° Displays appropriate judgment?Yes ° Can read the correct time from a watch face?Yes ° °EOL planning: Does Patient Have a Medical Advance Directive?: No °Would patient like information on creating a medical advance directive?: No - Patient declined ° ° ° ° °Review of Systems:  °Review of Systems  °Constitutional:  Negative for chills, diaphoresis, fever, malaise/fatigue and weight loss.  °HENT:  Negative for congestion, ear pain, hearing loss, sore throat and tinnitus.   °Eyes: Negative.   Negative for blurred vision and double vision.  °Respiratory:  Negative for cough, sputum production, shortness of breath and wheezing.   °Cardiovascular:  Negative for chest pain, palpitations, orthopnea, claudication, leg swelling and PND.  °Gastrointestinal:  Negative for abdominal pain, blood in stool, constipation, diarrhea, heartburn, melena, nausea and vomiting.  °Genitourinary: Negative.   °Musculoskeletal:  Positive for back pain. Negative for falls, joint pain and myalgias.  °Skin: Negative.  Negative for rash.  °     Small laceration left forearm  °Neurological:  Positive for tingling (bil fingers) and sensory change (chronic numbness of bil feet). Negative for dizziness, loss of consciousness, weakness and headaches.  °Endo/Heme/Allergies:  Negative for polydipsia.  °Psychiatric/Behavioral: Negative.  Negative for depression, memory loss, substance abuse and suicidal ideas. The patient is not nervous/anxious and does not have insomnia.   °All other systems reviewed and are negative. ° °Physical Exam: °BP (!) 142/78    Pulse 68    Temp (!) 97.5 °F (36.4 °C)    Wt (!) 312 lb 12.8 oz (141.9 kg)    SpO2 96%    BMI 40.71 kg/m²  °Wt Readings from Last 3 Encounters:  °03/10/21 (!) 312 lb 12.8 oz (141.9 kg)  °11/29/20 (!) 301 lb (136.5 kg)  °10/18/20 (!) 309 lb (140.2 kg)  ° °General Appearance: Well nourished well developed, non-toxic appearing, in no apparent distress. °Eyes: PERRLA, EOMs, conjunctiva no swelling or erythema °ENT/Mouth: Ear canals clear with no erythema, swelling, or discharge.  TMs normal bilaterally, oropharynx clear, moist, with no exudate.   °Neck: Supple, thyroid normal, no JVD, no cervical adenopathy.  °Respiratory: Respiratory effort normal, breath sounds clear A&P, no wheeze, rhonchi or rales noted.  No retractions, no accessory muscle usage °Cardio: RRR with no MRGs. No edema noted. Intact pedal and post tibial pulses, arm feet.  °Abdomen: Soft, + BS, morbidly obese abdomen limiting  exam.  Non tender, no guarding, rebound, hernias, he has mild ventral hernia with bearing down. °Musculoskeletal: Full ROM, 5/5 strength, antalgic gait °Skin: Warm, dry without rashes, lesions, ecchymosis. Calluses to bilateral feet without ulcers or wounds. Has a 2-3 cm laceration on right forearm, no S/s of infection °Neuro: Awake and oriented X   3, Cranial nerves intact. No cerebellar symptoms. He has 0/10 to monofilament in bilateral feet.  °Psych: normal affect, Insight and Judgment appropriate.  ° ° ° ° ° °Jason Wyatt [name], NP °9:44 AM °[facility] ° °

## 2021-03-10 ENCOUNTER — Ambulatory Visit (INDEPENDENT_AMBULATORY_CARE_PROVIDER_SITE_OTHER): Payer: Medicare Other | Admitting: Nurse Practitioner

## 2021-03-10 ENCOUNTER — Other Ambulatory Visit: Payer: Self-pay

## 2021-03-10 ENCOUNTER — Ambulatory Visit: Payer: Medicare Other | Admitting: Nurse Practitioner

## 2021-03-10 ENCOUNTER — Encounter: Payer: Self-pay | Admitting: Nurse Practitioner

## 2021-03-10 VITALS — BP 142/78 | HR 68 | Temp 97.5°F | Wt 312.8 lb

## 2021-03-10 DIAGNOSIS — Z8546 Personal history of malignant neoplasm of prostate: Secondary | ICD-10-CM

## 2021-03-10 DIAGNOSIS — S41111A Laceration without foreign body of right upper arm, initial encounter: Secondary | ICD-10-CM

## 2021-03-10 DIAGNOSIS — R6889 Other general symptoms and signs: Secondary | ICD-10-CM

## 2021-03-10 DIAGNOSIS — E1122 Type 2 diabetes mellitus with diabetic chronic kidney disease: Secondary | ICD-10-CM

## 2021-03-10 DIAGNOSIS — I10 Essential (primary) hypertension: Secondary | ICD-10-CM

## 2021-03-10 DIAGNOSIS — Z79899 Other long term (current) drug therapy: Secondary | ICD-10-CM

## 2021-03-10 DIAGNOSIS — Z0001 Encounter for general adult medical examination with abnormal findings: Secondary | ICD-10-CM | POA: Diagnosis not present

## 2021-03-10 DIAGNOSIS — E559 Vitamin D deficiency, unspecified: Secondary | ICD-10-CM

## 2021-03-10 DIAGNOSIS — G4733 Obstructive sleep apnea (adult) (pediatric): Secondary | ICD-10-CM

## 2021-03-10 DIAGNOSIS — E785 Hyperlipidemia, unspecified: Secondary | ICD-10-CM

## 2021-03-10 DIAGNOSIS — Z23 Encounter for immunization: Secondary | ICD-10-CM

## 2021-03-10 DIAGNOSIS — M1712 Unilateral primary osteoarthritis, left knee: Secondary | ICD-10-CM | POA: Diagnosis not present

## 2021-03-10 DIAGNOSIS — N182 Chronic kidney disease, stage 2 (mild): Secondary | ICD-10-CM | POA: Diagnosis not present

## 2021-03-10 DIAGNOSIS — E1169 Type 2 diabetes mellitus with other specified complication: Secondary | ICD-10-CM | POA: Diagnosis not present

## 2021-03-10 DIAGNOSIS — G2581 Restless legs syndrome: Secondary | ICD-10-CM

## 2021-03-10 DIAGNOSIS — E1142 Type 2 diabetes mellitus with diabetic polyneuropathy: Secondary | ICD-10-CM

## 2021-03-10 DIAGNOSIS — R2681 Unsteadiness on feet: Secondary | ICD-10-CM

## 2021-03-10 DIAGNOSIS — M1A00X0 Idiopathic chronic gout, unspecified site, without tophus (tophi): Secondary | ICD-10-CM

## 2021-03-10 DIAGNOSIS — E662 Morbid (severe) obesity with alveolar hypoventilation: Secondary | ICD-10-CM

## 2021-03-10 DIAGNOSIS — Z0000 Encounter for general adult medical examination without abnormal findings: Secondary | ICD-10-CM

## 2021-03-10 LAB — CBC WITH DIFFERENTIAL/PLATELET
Absolute Monocytes: 543 cells/uL (ref 200–950)
Basophils Absolute: 54 cells/uL (ref 0–200)
Basophils Relative: 0.8 %
Eosinophils Absolute: 121 cells/uL (ref 15–500)
Eosinophils Relative: 1.8 %
HCT: 38.3 % — ABNORMAL LOW (ref 38.5–50.0)
Hemoglobin: 12.6 g/dL — ABNORMAL LOW (ref 13.2–17.1)
Lymphs Abs: 1360 cells/uL (ref 850–3900)
MCH: 29.9 pg (ref 27.0–33.0)
MCHC: 32.9 g/dL (ref 32.0–36.0)
MCV: 90.8 fL (ref 80.0–100.0)
MPV: 10.1 fL (ref 7.5–12.5)
Monocytes Relative: 8.1 %
Neutro Abs: 4623 cells/uL (ref 1500–7800)
Neutrophils Relative %: 69 %
Platelets: 277 10*3/uL (ref 140–400)
RBC: 4.22 10*6/uL (ref 4.20–5.80)
RDW: 14.5 % (ref 11.0–15.0)
Total Lymphocyte: 20.3 %
WBC: 6.7 10*3/uL (ref 3.8–10.8)

## 2021-03-10 LAB — LIPID PANEL
Cholesterol: 93 mg/dL (ref <200)
HDL: 31 mg/dL — ABNORMAL LOW (ref >=40)
LDL Cholesterol (Calc): 33 mg/dL (calc)
Non-HDL Cholesterol (Calc): 62 mg/dL (calc) (ref <130)
Total CHOL/HDL Ratio: 3 (calc) (ref <5.0)
Triglycerides: 219 mg/dL — ABNORMAL HIGH (ref <150)

## 2021-03-10 LAB — COMPLETE METABOLIC PANEL WITH GFR
AG Ratio: 1.9 (calc) (ref 1.0–2.5)
ALT: 12 U/L (ref 9–46)
AST: 14 U/L (ref 10–35)
Albumin: 4.2 g/dL (ref 3.6–5.1)
Alkaline phosphatase (APISO): 65 U/L (ref 35–144)
BUN: 16 mg/dL (ref 7–25)
CO2: 27 mmol/L (ref 20–32)
Calcium: 9.6 mg/dL (ref 8.6–10.3)
Chloride: 104 mmol/L (ref 98–110)
Creat: 0.85 mg/dL (ref 0.70–1.35)
Globulin: 2.2 g/dL (calc) (ref 1.9–3.7)
Glucose, Bld: 197 mg/dL — ABNORMAL HIGH (ref 65–99)
Potassium: 4.7 mmol/L (ref 3.5–5.3)
Sodium: 138 mmol/L (ref 135–146)
Total Bilirubin: 0.6 mg/dL (ref 0.2–1.2)
Total Protein: 6.4 g/dL (ref 6.1–8.1)
eGFR: 95 mL/min/{1.73_m2} (ref >=60)

## 2021-03-10 LAB — HEMOGLOBIN A1C
Hgb A1c MFr Bld: 7.3 % of total Hgb — ABNORMAL HIGH (ref <5.7)
Mean Plasma Glucose: 163 mg/dL
eAG (mmol/L): 9 mmol/L

## 2021-03-24 DIAGNOSIS — M7541 Impingement syndrome of right shoulder: Secondary | ICD-10-CM | POA: Diagnosis not present

## 2021-03-24 DIAGNOSIS — M25511 Pain in right shoulder: Secondary | ICD-10-CM | POA: Diagnosis not present

## 2021-03-26 DIAGNOSIS — G4733 Obstructive sleep apnea (adult) (pediatric): Secondary | ICD-10-CM | POA: Diagnosis not present

## 2021-04-01 ENCOUNTER — Other Ambulatory Visit: Payer: Self-pay | Admitting: Nurse Practitioner

## 2021-04-01 DIAGNOSIS — E1129 Type 2 diabetes mellitus with other diabetic kidney complication: Secondary | ICD-10-CM

## 2021-04-01 DIAGNOSIS — G8929 Other chronic pain: Secondary | ICD-10-CM

## 2021-04-01 MED ORDER — TRULICITY 3 MG/0.5ML ~~LOC~~ SOAJ: 3.0000 mg | SUBCUTANEOUS | 3 refills | Status: DC

## 2021-04-01 MED ORDER — DULOXETINE HCL 60 MG PO CPEP: 60.0000 mg | ORAL_CAPSULE | Freq: Every day | ORAL | 3 refills | Status: DC

## 2021-04-04 ENCOUNTER — Telehealth: Payer: Self-pay | Admitting: Nurse Practitioner

## 2021-04-04 DIAGNOSIS — M4326 Fusion of spine, lumbar region: Secondary | ICD-10-CM | POA: Diagnosis not present

## 2021-04-04 DIAGNOSIS — M791 Myalgia, unspecified site: Secondary | ICD-10-CM | POA: Diagnosis not present

## 2021-04-04 DIAGNOSIS — M545 Low back pain, unspecified: Secondary | ICD-10-CM | POA: Diagnosis not present

## 2021-04-04 NOTE — Telephone Encounter (Signed)
Please resend trulicity to [facility]  ?

## 2021-04-20 ENCOUNTER — Other Ambulatory Visit: Payer: Self-pay | Admitting: Internal Medicine

## 2021-04-26 DIAGNOSIS — G4733 Obstructive sleep apnea (adult) (pediatric): Secondary | ICD-10-CM | POA: Diagnosis not present

## 2021-05-03 ENCOUNTER — Encounter: Payer: Self-pay | Admitting: Podiatry

## 2021-05-03 ENCOUNTER — Ambulatory Visit: Payer: Medicare Other | Admitting: Podiatry

## 2021-05-03 DIAGNOSIS — D2371 Other benign neoplasm of skin of right lower limb, including hip: Secondary | ICD-10-CM | POA: Diagnosis not present

## 2021-05-03 DIAGNOSIS — B351 Tinea unguium: Secondary | ICD-10-CM

## 2021-05-03 DIAGNOSIS — E1142 Type 2 diabetes mellitus with diabetic polyneuropathy: Secondary | ICD-10-CM

## 2021-05-03 DIAGNOSIS — D2372 Other benign neoplasm of skin of left lower limb, including hip: Secondary | ICD-10-CM | POA: Diagnosis not present

## 2021-05-03 DIAGNOSIS — M79676 Pain in unspecified toe(s): Secondary | ICD-10-CM

## 2021-05-03 NOTE — Progress Notes (Signed)
Presents today chief complaint of painful elongated toenails and calluses.  Denies changes in his past medical history medications allergies surgeries and social history. ? ?Objective: Vital signs are stable alert and oriented x3 there is no erythema edema cellulitis drainage or odor pulses are palpable.  Neurologic sensorium is still diminished per Semmes Weinstein monofilament hammertoe deformities bilateral.  Distal clavus is noted to multiple toes with benign skin lesion plantar aspect of the forefoot.  Toenails are long thick yellow dystrophic and clinically mycotic. ? ?Assessment: Pain in limb secondary to diabetes diabetic peripheral neuropathy preulcerative calluses benign skin lesions and painful elongated toenails. ? ?Plan: Debrided painful elongated toenails debrided all benign skin lesions.  Follow-up with him in 3 months ?

## 2021-05-11 ENCOUNTER — Other Ambulatory Visit: Payer: Self-pay | Admitting: Adult Health

## 2021-05-29 ENCOUNTER — Other Ambulatory Visit: Payer: Self-pay | Admitting: Nurse Practitioner

## 2021-05-29 DIAGNOSIS — E1129 Type 2 diabetes mellitus with other diabetic kidney complication: Secondary | ICD-10-CM

## 2021-05-30 DIAGNOSIS — G4733 Obstructive sleep apnea (adult) (pediatric): Secondary | ICD-10-CM | POA: Diagnosis not present

## 2021-06-02 ENCOUNTER — Ambulatory Visit (INDEPENDENT_AMBULATORY_CARE_PROVIDER_SITE_OTHER): Payer: Medicare Other | Admitting: Nurse Practitioner

## 2021-06-02 ENCOUNTER — Encounter: Payer: Self-pay | Admitting: Nurse Practitioner

## 2021-06-02 VITALS — BP 104/70 | HR 81 | Temp 95.5°F | Wt 315.0 lb

## 2021-06-02 DIAGNOSIS — I70209 Unspecified atherosclerosis of native arteries of extremities, unspecified extremity: Secondary | ICD-10-CM | POA: Diagnosis not present

## 2021-06-02 DIAGNOSIS — E1169 Type 2 diabetes mellitus with other specified complication: Secondary | ICD-10-CM | POA: Diagnosis not present

## 2021-06-02 DIAGNOSIS — E785 Hyperlipidemia, unspecified: Secondary | ICD-10-CM

## 2021-06-02 DIAGNOSIS — N182 Chronic kidney disease, stage 2 (mild): Secondary | ICD-10-CM

## 2021-06-02 DIAGNOSIS — N138 Other obstructive and reflux uropathy: Secondary | ICD-10-CM

## 2021-06-02 DIAGNOSIS — Z79899 Other long term (current) drug therapy: Secondary | ICD-10-CM | POA: Diagnosis not present

## 2021-06-02 DIAGNOSIS — I10 Essential (primary) hypertension: Secondary | ICD-10-CM | POA: Diagnosis not present

## 2021-06-02 DIAGNOSIS — G4733 Obstructive sleep apnea (adult) (pediatric): Secondary | ICD-10-CM

## 2021-06-02 DIAGNOSIS — E1122 Type 2 diabetes mellitus with diabetic chronic kidney disease: Secondary | ICD-10-CM

## 2021-06-02 DIAGNOSIS — N401 Enlarged prostate with lower urinary tract symptoms: Secondary | ICD-10-CM

## 2021-06-02 DIAGNOSIS — G2581 Restless legs syndrome: Secondary | ICD-10-CM | POA: Diagnosis not present

## 2021-06-02 DIAGNOSIS — M1712 Unilateral primary osteoarthritis, left knee: Secondary | ICD-10-CM

## 2021-06-02 MED ORDER — ROPINIROLE HCL 0.25 MG PO TABS: ORAL_TABLET | ORAL | 2 refills | Status: DC

## 2021-06-02 MED ORDER — TAMSULOSIN HCL 0.4 MG PO CAPS: ORAL_CAPSULE | ORAL | 3 refills | Status: DC

## 2021-06-02 NOTE — Patient Instructions (Signed)

## 2021-06-02 NOTE — Progress Notes (Signed)
3 month follow up   Assessment and Plan  Diagnoses and all orders for this visit:  1. Primary hypertension Continue medication Zebeta, Benicar. Monitor blood pressure at home more closely; call if consistently over 130/80 Continue DASH diet.   Reminder to go to the ER if any CP, SOB, nausea, dizziness, severe HA, changes vision/speech, left arm numbness and tingling and jaw pain.  - CBC with Differential/Platelet  2. OSA on CPAP Sleeping ~6 hours Well Controlled. Continue to  monitor.  4. CKD stage 2 due to type 2 diabetes mellitus (HCC) Continue Metformin, Trulicity 3 mg.  A1C trending down slowly. Stay well hydrated. Avoid NSAIDS and nephrotoxic foods. Control sugars and blood pressure Dietary recommendations discussed. Physical Activity recommendations Continue to monitor.  - COMPLETE METABOLIC PANEL WITH GFR - Hemoglobin A1c  5. Hyperlipidemia associated with type 2 diabetes mellitus ([place]) Not at goal. Continue Rosuvastatin. Discussed weight loss, lifestyle modifications.  - Lipid panel  6. Primary osteoarthritis of left knee Using straight cane. Continue Neurontin, Mobic  Voltaren Gel Topical OTC PRN Discussed benefit of weight loss.  7. Morbid obesity ([place]) No weight loss since LOV Continue Trulicity. Continue lifestyle modifications.  - COMPLETE METABOLIC PANEL WITH GFR  8. RLS Continue Sinemet IR Start Requip 0.25 mg HS Discussed increasing dose if not effective. Stay well hydrated.  - Ropinirole (Requip) 0.25 MG tablet Take 1 tablet (0.25 mg) 1 hour before bedtime., Normal  9. Medication management All medications discussed and reviewed. All question and concerns addressed.  - CBC with Differential/Platelet - COMPLETE METABOLIC PANEL WITH GFR - Lipid panel - Magnesium - Hemoglobin A1c  10. BPH with obstruction/lower urinary tract symptoms Not controlled. Increase Flomax to 0.8 mg daily. Continue to  monitor.  - tamsulosin (FLOMAX)  0.4 MG CAPS capsule; TAKE 2 CAPSULES (0.08 mg) BY MOUTH AT  BEDTIME FOR PROSTATE  Dispense: 180 capsule; Refill: 3     Future Appointments  Date Time Provider [place]  06/02/2021  3:00 PM Darrol Jump, NP [facility] None  08/04/2021  8:15 AM [name], [name], DPM [facility]  11/29/2021 10:00 AM Magda Bernheim, NP [facility] None  03/10/2022  9:00 AM Magda Bernheim, NP [facility] None     HPI BP 104/70   Pulse 81   Temp (!) 95.5 F (35.3 C)   Wt (!) 315 lb (142.9 kg)   SpO2 96%   BMI 41.00 kg/m   66 y.o. male  presents for 3 month follow up with hypertension, hyperlipidemia, diabetes and vitamin D deficiency. He has CKD stage 2 due to type 2 diabetes mellitus ([place]); Morbid obesity ([place]); Obesity hypoventilation syndrome ([place]); T2_NIDDM w/ CKD 2 ; Gout; Hyperlipidemia associated with type 2 diabetes mellitus ([place]); Hypertension; RLS; Medication management; DM type 2 with diabetic peripheral neuropathy ([place]); Normal coronary arteries; Primary osteoarthritis of left knee; Gait instability; Bilateral carpal tunnel syndrome; Heart rate fast; History of prostate cancer; Iron deficiency anemia; Cubital tunnel syndrome; Obesity with alveolar hypoventilation, unspecified obesity severity (HCC); CPAP (continuous positive airway pressure) dependence; Other insomnia; S/P UPPP (uvulopalatopharyngoplasty); Shortness of breath; OSA on CPAP; and Severe obstructive sleep apnea on their problem list.  He is married, 2 sons, no grandchildren. Enjoys his dog. He is semi retired Company secretary. His wife had recent V Tach sustained and MI with resultant pacemaker placed, as well as broken left femur.  She is now stable and doing well.  She was cleared by PT/OT last week and is able to move around more  in the home.     He is on CPAP for OSA/obesity hypoventilation syndrome and endorses 100% compliance and restorative sleep.   He has imbalance attributed to advanced peripheral neuropathy, no falls this  year, does ambulate without cane if needed. Son drives him when needed. He follows with Dr. [name] for back pain. Follows with podiatry Dr. [name] for diabetic neuropathy.  He has not had a follow up recently. Denies any new acute issues.    He is on sinemet BID for RLS. Also takes gabapentin 600 mg TID PRN.  This is no longer effective.  Continue to have wake cycles of legs painful and jerking during the night.    He is taking flomax in the AM per urology for sense of incomplete bladder emptying, feels the dosage is no longer effective as he is urinating more often throughout the day and night. hx of prostate cancer and robotic excision in 2014 and followed by Dr. [name] at [facility]. PSAs have remained <0.1.   BMI is Body mass index is 41 kg/m., he has not been working on diet and exercise due back/joint pain and taking care of wife. Knows he needs to do better with diet. He has gained 3 lb since last OV   Wt Readings from Last 3 Encounters:  06/02/21 (!) 315 lb (142.9 kg)  03/10/21 (!) 312 lb 12.8 oz (141.9 kg)  11/29/20 (!) 301 lb (136.5 kg)   His blood pressure has been controlled at home, today their BP is BP: 104/70  BP Readings from Last 3 Encounters:  06/02/21 104/70  03/10/21 (!) 142/78  11/29/20 132/82     He does not workout. He denies chest pain, shortness of breath, dizziness.   He is on cholesterol medication, on Atorvastatin 20 mg every other day denies myalgias. His cholesterol is not at goal. The cholesterol last visit was:   Lab Results  Component Value Date   CHOL 93 03/10/2021   HDL 31 (L) 03/10/2021   LDLCALC 33 03/10/2021   TRIG 219 (H) 03/10/2021   CHOLHDL 3.0 03/10/2021    He has been working on diet  for Type 2 diabetes on metformin 2000 mg daily, recently started on trulicity 3 mg weekly, and denies foot ulcerations, increased appetite, nausea, polydipsia, polyuria, visual disturbances, vomiting and weight loss.  Last A1C in the office was:  Lab  Results  Component Value Date   HGBA1C 7.3 (H) 03/10/2021   He has had hx of CKD II associated with T2DM monitored at this office. On enalapril. Last GFR:  Lab Results  Component Value Date   GFRNONAA >60 08/16/2020   Patient is on Vitamin D supplement, taking 50000 IU every other day    Lab Results  Component Value Date   VD25OH 100 11/29/2020     Patient is on allopurinol for gout and does not report a recent flare.  Lab Results  Component Value Date   LABURIC 5.6 06/16/2020    Current Outpatient Medications  Medication Instructions   Accu-Chek Softclix Lancets lancets USE WITH METER TO CHECK  BLOOD SUGAR ONCE DAILY   acetaminophen (TYLENOL) 500 mg, Oral, Every 6 hours PRN   acyclovir (ZOVIRAX) 400 MG tablet TAKE 1 TABLET BY MOUTH  DAILY AS NEEDED FOR COLD  SORES   allopurinol (ZYLOPRIM) 300 MG tablet TAKE 1 TABLET BY MOUTH  DAILY FOR GOUT PREVENTION   aspirin 81 mg, Oral, Daily   atorvastatin (LIPITOR) 20 MG tablet TAKE 1 TABLET BY  MOUTH  DAILY   bisoprolol (ZEBETA) 10 MG tablet TAKE 1 TABLET BY MOUTH IN  THE MORNING FOR BLOOD  PRESSURE   Blood Glucose Monitoring Suppl (ONE TOUCH ULTRA 2) w/Device KIT SMARTSIG:Via Meter   carbidopa-levodopa (SINEMET IR) 25-250 MG tablet TAKE 1 TABLET BY MOUTH  TWICE DAILY FOR RESTLESS  LEGS   Cyanocobalamin (B-12) 2500 MCG SUBL Sublingual, Every other day   dextromethorphan-guaiFENesin (MUCINEX DM) 30-600 MG 12hr tablet 1 tablet, Oral, 2 times daily PRN   DULoxetine (CYMBALTA) 60 mg, Oral, Daily at bedtime   fexofenadine (ALLEGRA) 180 mg, Oral, Daily   gabapentin (NEURONTIN) 600 MG tablet TAKE 1/2 TO 1 TABLET BY  MOUTH 2 TO 3 TIMES DAILY AS NEEDED FOR PAIN   Magnesium 1,500 mg, Oral, Every evening   meloxicam (MOBIC) 7.5 MG tablet Take 1 tablet Daily  with Food  is needed for Pain   metFORMIN (GLUCOPHAGE-XR) 500 MG 24 hr tablet TAKE 2 TABLETS BY MOUTH  TWICE DAILY WITH MEALS FOR  DIABETES   methocarbamol (ROBAXIN) 500 mg, Oral, Daily PRN    Multiple Vitamin (MULTIVITAMIN) tablet 1 tablet, Oral, Daily   olmesartan (BENICAR) 40 MG tablet TAKE 1 TABLET BY MOUTH  EVERY NIGHT FOR BLOOD  PRESSURE AND DIABETIC  KIDNEY PROTECTION   ONETOUCH ULTRA test strip USE TO CHECK BLOOD SUGAR  DAILY   pantoprazole (PROTONIX) 40 MG tablet TAKE 1 TABLET DAILY TO  PREVENT HEARTBURN &amp;  INDIGESTION   sennosides-docusate sodium (SENOKOT-S) 8.6-50 MG tablet 1 tablet, Oral, Daily   tamsulosin (FLOMAX) 0.4 MG CAPS capsule TAKE 1 CAPSULE BY MOUTH AT  BEDTIME FOR PROSTATE   TRULICITY 3 IH/4.7QQ SOPN INJECT THE CONTENTS OF ONE PEN  SUBCUTANEOUSLY WEEKLY AS  DIRECTED   Vitamin D (Ergocalciferol) (DRISDOL) 50,000 Units, Oral, As directed, Tuesday , Wednesday, Thursday, Sunday     Medical History:  Past Medical History:  Diagnosis Date   Acute meniscal tear of left knee    Allergic rhinitis    Arthritis     back   Borderline glaucoma    Chronic back pain    stenosis   CKD (chronic kidney disease), stage II    Complication of anesthesia cervical fusion surgery 06/2010   "woke up next day w/ventilator on" told due to OSA   GERD (gastroesophageal reflux disease)    takes Prevacid daily   Gout    takes Allopurinol daily and Colchicine if needed   History of adenomatous polyp of colon    tubular adenoma's   History of Barrett's esophagus    History of hiatal hernia    Hyperlipidemia    Hypertension    Incomplete right bundle branch block (RBBB)    OA (osteoarthritis)    knee   OSA on CPAP    Peripheral neuropathy    Prostate cancer [facility]) urologist-  dr Alinda Money   dx 2013  s/p  radical non-nerve sparing prostatectomy 2014--  Stage pT2cNx,  PSA 3.48,  Gleason 3+3,  vol 55cc/  current PSA  0.01 (Aug 2016)   Rupture of Achilles tendon    Thyroid nodule    left side   Type II diabetes mellitus (HCC)    Urinary frequency    takes Flomax daily   Urine incontinence    Wears glasses    Allergies Allergies  Allergen Reactions   Fenofibrate Itching  and Rash   Flaxseed Oil Rash   Flaxseed [Flaxseed (Linseed)] Rash   Lyrica [Pregabalin] Itching and Rash   Niaspan [  Niacin Er] Rash   Preventative Medicine Immunization History  Administered Date(s) Administered   Influenza Inj Mdck Quad With Preservative 11/22/2016, 12/04/2017   Influenza Split 10/17/2013, 10/29/2014   Influenza, High Dose Seasonal PF 11/26/2018, 11/27/2019, 11/29/2020   Influenza, Seasonal, Injecte, Preservative Fre 11/29/2015   Influenza-Unspecified 11/04/2012   PFIZER(Purple Top)SARS-COV-2 Vaccination 03/10/2019, 03/31/2019, 11/06/2019   PPD Test 01/16/2006, 04/07/2013   Pfizer Covid-19 Vaccine Bivalent Booster 12yr & up 02/16/2021   Pneumococcal Conjugate-13 07/29/2018   Pneumococcal Polysaccharide-23 04/07/2013, 11/22/2016   Pneumococcal-Unspecified 09/17/2010   Td 01/17/2011, 03/10/2021   Zoster Recombinat (Shingrix) 02/16/2021   Zoster, Live 01/16/2005    Patient Care Team: MUnk Pinto MD as PCP - General (Internal Medicine) BLorretta Harp MD as PCP - Cardiology (Cardiology) HGarrel Ridgel DPM as Consulting Physician (Podiatry) MWebb Laws O[place]as Referring Physician (Optometry) JMartinique Peter M, MD as Consulting Physician (Cardiology) BRonald Lobo MD as Consulting Physician (Gastroenterology) BSusa Day MD as Consulting Physician (Orthopedic Surgery) HArdis Hughs MD as Attending Physician (Urology)   SURGICAL HISTORY He  has a past surgical history that includes Posterior fusion cervical spine (07-05-2010); Esophagogastroduodenoscopy (09/26/2011); Robot assisted laparoscopic radical prostatectomy (02/07/2012); wisdom teeth extracted  (1982); left and right heart catheterization with coronary angiogram (N/A, 08/25/2011); Anal fissure repair (06-05-2002); Cardiovascular stress test (01-01-2008); transthoracic echocardiogram (08-03-2011); Posterior lumbar fusion (05-18-2014); Uvulopalatopharyngoplasty  (uppp)/tonsillectomy/septoplasty (1990's); Knee arthroscopy with medial menisectomy (Left, 12/31/2014); Cardiac catheterization; Back surgery; Total knee arthroplasty (Left, 08/05/2015); Gastroc recession extremity (Left, 06/27/2017); Achilles tendon surgery (Left, 06/27/2017); Tendon transfer (Left, 06/27/2017); Carpal tunnel release (Left, 03/27/2018); and Carpal tunnel release (Right, 06/2018). FAMILY HISTORY His family history includes Cancer in his father; Dementia in his mother; Heart attack in his maternal grandmother; Hyperlipidemia in his sister; Hypertension in his mother, paternal grandfather, and sister; Osteoarthritis in his mother; Osteoporosis in his mother; Sleep apnea in his son and son; Stroke in his maternal grandmother. SOCIAL HISTORY He  reports that he has never smoked. He has never used smokeless tobacco. He reports that he does not drink alcohol and does not use drugs.   Review of Systems:   Review of Systems  Constitutional:  Negative for chills, fever and weight loss.  HENT:  Negative for hearing loss and tinnitus.   Eyes:  Negative for blurred vision.  Respiratory:  Negative for cough and wheezing.   Cardiovascular:  Negative for chest pain and leg swelling.  Gastrointestinal:  Negative for abdominal pain, constipation, diarrhea, heartburn, nausea and vomiting.  Genitourinary:  Positive for frequency. Negative for dysuria and urgency.  Musculoskeletal:  Positive for joint pain. Negative for falls, myalgias and neck pain.  Skin:  Negative for itching and rash.  Neurological:  Negative for dizziness and headaches.  Psychiatric/Behavioral:  Negative for depression. The patient is not nervous/anxious.     Physical Exam: BP 104/70   Pulse 81   Temp (!) 95.5 F (35.3 C)   Wt (!) 315 lb (142.9 kg)   SpO2 96%   BMI 41.00 kg/m  Wt Readings from Last 3 Encounters:  06/02/21 (!) 315 lb (142.9 kg)  03/10/21 (!) 312 lb 12.8 oz (141.9 kg)  11/29/20 (!) 301 lb (136.5 kg)    General Appearance: Well nourished well developed, non-toxic appearing, in no apparent distress. Eyes: PERRLA, EOMs, conjunctiva no swelling or erythema ENT/Mouth: Ear canals clear with no erythema, swelling, or discharge.  TMs normal bilaterally, oropharynx clear, moist, with no exudate.   Neck: Supple, thyroid normal, no JVD, no cervical  adenopathy.  Respiratory: Respiratory effort normal, breath sounds clear A&P, no wheeze, rhonchi or rales noted.  No retractions, no accessory muscle usage Cardio: RRR with no MRGs. No edema noted. Intact pedal and post tibial pulses, arm feet.  Abdomen: Soft, + BS, morbidly obese abdomen limiting exam.  Non tender, no guarding, rebound, hernias, he has mild ventral hernia with bearing down. Musculoskeletal: LROM BLE d/t pain.  Walks with cane. 5/5 strength, antalgic gait Skin: Warm, dry without rashes, lesions, ecchymosis. Calluses to bilateral feet without ulcers or wounds. Has a 2-3 cm laceration on right forearm, no S/s of infection Neuro: Awake and oriented X 3, Cranial nerves intact. No cerebellar symptoms. He has 0/10 to monofilament in bilateral feet.  Psych: normal affect, Insight and Judgment appropriate.    Darrol Jump, NP 2:12 PM [facility]

## 2021-06-03 LAB — CBC WITH DIFFERENTIAL/PLATELET
Absolute Monocytes: 696 cells/uL (ref 200–950)
Basophils Absolute: 70 cells/uL (ref 0–200)
Basophils Relative: 0.8 %
Eosinophils Absolute: 87 cells/uL (ref 15–500)
Eosinophils Relative: 1 %
HCT: 37.9 % — ABNORMAL LOW (ref 38.5–50.0)
Hemoglobin: 12.8 g/dL — ABNORMAL LOW (ref 13.2–17.1)
Lymphs Abs: 1627 cells/uL (ref 850–3900)
MCH: 30.4 pg (ref 27.0–33.0)
MCHC: 33.8 g/dL (ref 32.0–36.0)
MCV: 90 fL (ref 80.0–100.0)
MPV: 9.9 fL (ref 7.5–12.5)
Monocytes Relative: 8 %
Neutro Abs: 6221 cells/uL (ref 1500–7800)
Neutrophils Relative %: 71.5 %
Platelets: 293 10*3/uL (ref 140–400)
RBC: 4.21 10*6/uL (ref 4.20–5.80)
RDW: 15.3 % — ABNORMAL HIGH (ref 11.0–15.0)
Total Lymphocyte: 18.7 %
WBC: 8.7 10*3/uL (ref 3.8–10.8)

## 2021-06-03 LAB — COMPLETE METABOLIC PANEL WITH GFR
AG Ratio: 1.8 (calc) (ref 1.0–2.5)
ALT: 16 U/L (ref 9–46)
AST: 15 U/L (ref 10–35)
Albumin: 4.2 g/dL (ref 3.6–5.1)
Alkaline phosphatase (APISO): 63 U/L (ref 35–144)
BUN: 15 mg/dL (ref 7–25)
CO2: 27 mmol/L (ref 20–32)
Calcium: 9.8 mg/dL (ref 8.6–10.3)
Chloride: 102 mmol/L (ref 98–110)
Creat: 0.98 mg/dL (ref 0.70–1.35)
Globulin: 2.3 g/dL (calc) (ref 1.9–3.7)
Glucose, Bld: 147 mg/dL — ABNORMAL HIGH (ref 65–99)
Potassium: 4.8 mmol/L (ref 3.5–5.3)
Sodium: 139 mmol/L (ref 135–146)
Total Bilirubin: 0.5 mg/dL (ref 0.2–1.2)
Total Protein: 6.5 g/dL (ref 6.1–8.1)
eGFR: 84 mL/min/{1.73_m2} (ref >=60)

## 2021-06-03 LAB — MAGNESIUM: Magnesium: 1.8 mg/dL (ref 1.5–2.5)

## 2021-06-03 LAB — LIPID PANEL
Cholesterol: 96 mg/dL (ref <200)
HDL: 33 mg/dL — ABNORMAL LOW (ref >=40)
LDL Cholesterol (Calc): 30 mg/dL (calc)
Non-HDL Cholesterol (Calc): 63 mg/dL (calc) (ref <130)
Total CHOL/HDL Ratio: 2.9 (calc) (ref <5.0)
Triglycerides: 278 mg/dL — ABNORMAL HIGH (ref <150)

## 2021-06-03 LAB — HEMOGLOBIN A1C
Hgb A1c MFr Bld: 7.3 % of total Hgb — ABNORMAL HIGH (ref <5.7)
Mean Plasma Glucose: 163 mg/dL
eAG (mmol/L): 9 mmol/L

## 2021-06-14 ENCOUNTER — Telehealth: Payer: Self-pay | Admitting: Nurse Practitioner

## 2021-06-14 ENCOUNTER — Telehealth: Payer: Self-pay | Admitting: Adult Health

## 2021-06-14 NOTE — Telephone Encounter (Signed)
Says he saw you recently and that he has been having pain in his shoulders, knees and hands. Thought it was neuropathy but he thinks it's joint related and feels like its arthritis. It has been worse the past few days with the rain. Please advise.Jason KitchenMarland Wyatt

## 2021-06-14 NOTE — Telephone Encounter (Signed)
Patient has been getting his Trulicity through [facility] but he states that his copay has now gone up to $783.00. He is unable to afford that and would like to start getting it through [facility] again. Please advise and how to get re-started again.

## 2021-06-15 ENCOUNTER — Other Ambulatory Visit: Payer: Self-pay

## 2021-06-15 DIAGNOSIS — E1129 Type 2 diabetes mellitus with other diabetic kidney complication: Secondary | ICD-10-CM

## 2021-06-15 MED ORDER — TRULICITY 3 MG/0.5ML ~~LOC~~ SOAJ: SUBCUTANEOUS | 3 refills | Status: DC

## 2021-06-15 NOTE — Telephone Encounter (Signed)
Spoke with patient, had patient assistance last year. Resubmitted to [facility]. Patient aware.

## 2021-07-04 ENCOUNTER — Other Ambulatory Visit: Payer: Self-pay | Admitting: Nurse Practitioner

## 2021-07-04 DIAGNOSIS — E1142 Type 2 diabetes mellitus with diabetic polyneuropathy: Secondary | ICD-10-CM

## 2021-07-05 DIAGNOSIS — M7918 Myalgia, other site: Secondary | ICD-10-CM | POA: Diagnosis not present

## 2021-07-05 DIAGNOSIS — M4326 Fusion of spine, lumbar region: Secondary | ICD-10-CM | POA: Diagnosis not present

## 2021-07-05 DIAGNOSIS — M545 Low back pain, unspecified: Secondary | ICD-10-CM | POA: Diagnosis not present

## 2021-07-05 NOTE — Progress Notes (Unsigned)
Assessment and Plan:  There are no diagnoses linked to this encounter.    Further disposition pending results of labs. Discussed med's effects and SE's.   Over 30 minutes of exam, counseling, chart review, and critical decision making was performed.   Future Appointments  Date Time Provider [place]  07/06/2021  9:45 AM Alycia Rossetti, NP [facility] None  08/04/2021  8:15 AM Tyson Dense T, DPM [facility]  09/06/2021  2:30 PM Unk Pinto, MD [facility] None  12/16/2021 10:00 AM Alycia Rossetti, NP [facility] None  03/10/2022  9:00 AM Alycia Rossetti, NP [facility] None    ------------------------------------------------------------------------------------------------------------------   HPI There were no vitals taken for this visit. 66 y.o.male presents for  Past Medical History:  Diagnosis Date   Acute meniscal tear of left knee    Allergic rhinitis    Arthritis     back   Borderline glaucoma    Chronic back pain    stenosis   CKD (chronic kidney disease), stage II    Complication of anesthesia cervical fusion surgery 06/2010   "woke up next day w/ventilator on" told due to OSA   GERD (gastroesophageal reflux disease)    takes Prevacid daily   Gout    takes Allopurinol daily and Colchicine if needed   History of adenomatous polyp of colon    tubular adenoma's   History of Barrett's esophagus    History of hiatal hernia    Hyperlipidemia    Hypertension    Incomplete right bundle branch block (RBBB)    OA (osteoarthritis)    knee   OSA on CPAP    Peripheral neuropathy    Prostate cancer [facility]) urologist-  dr Alinda Money   dx 2013  s/p  radical non-nerve sparing prostatectomy 2014--  Stage pT2cNx,  PSA 3.48,  Gleason 3+3,  vol 55cc/  current PSA  0.01 (Aug 2016)   Rupture of Achilles tendon    Thyroid nodule    left side   Type II diabetes mellitus ([place])    Urinary frequency    takes Flomax daily   Urine incontinence    Wears glasses       Allergies  Allergen Reactions   Fenofibrate Itching and Rash   Flaxseed Oil Rash   Flaxseed [Flaxseed (Linseed)] Rash   Lyrica [Pregabalin] Itching and Rash   Niaspan [Niacin Er] Rash    Current Outpatient Medications on File Prior to Visit  Medication Sig   Accu-Chek Softclix Lancets lancets USE WITH METER TO CHECK  BLOOD SUGAR ONCE DAILY   acetaminophen (TYLENOL) 500 MG tablet Take 500 mg by mouth every 6 (six) hours as needed for moderate pain.   acyclovir (ZOVIRAX) 400 MG tablet TAKE 1 TABLET BY MOUTH  DAILY AS NEEDED FOR COLD  SORES   allopurinol (ZYLOPRIM) 300 MG tablet TAKE 1 TABLET BY MOUTH  DAILY FOR GOUT PREVENTION   aspirin 81 MG tablet Take 81 mg by mouth daily.   atorvastatin (LIPITOR) 20 MG tablet TAKE 1 TABLET BY MOUTH  DAILY   bisoprolol (ZEBETA) 10 MG tablet TAKE 1 TABLET BY MOUTH IN  THE MORNING FOR BLOOD  PRESSURE   Blood Glucose Monitoring Suppl (ONE TOUCH ULTRA 2) w/Device KIT SMARTSIG:Via Meter   carbidopa-levodopa (SINEMET IR) 25-250 MG tablet TAKE 1 TABLET BY MOUTH  TWICE DAILY FOR RESTLESS  LEGS   Cyanocobalamin (B-12) 2500 MCG SUBL Place under the tongue every other day.   dextromethorphan-guaiFENesin (MUCINEX DM) 30-600 MG 12hr tablet Take  1 tablet by mouth 2 (two) times daily as needed for cough.   Dulaglutide (TRULICITY) 3 UM/3.5TI SOPN INJECT THE CONTENTS OF ONE PEN  SUBCUTANEOUSLY WEEKLY AS  DIRECTED   DULoxetine (CYMBALTA) 60 MG capsule Take 1 capsule (60 mg total) by mouth at bedtime.   fexofenadine (ALLEGRA) 180 MG tablet Take 180 mg by mouth daily.   gabapentin (NEURONTIN) 600 MG tablet TAKE 1/2 TO 1 TABLET BY  MOUTH 2 TO 3 TIMES DAILY AS NEEDED FOR PAIN   Magnesium 500 MG CAPS Take 1,500 mg by mouth every evening.    meloxicam (MOBIC) 7.5 MG tablet Take 1 tablet Daily  with Food  is needed for Pain   metFORMIN (GLUCOPHAGE-XR) 500 MG 24 hr tablet TAKE 2 TABLETS BY MOUTH  TWICE DAILY WITH MEALS FOR  DIABETES   methocarbamol (ROBAXIN) 500 MG tablet  Take 1 tablet (500 mg total) by mouth daily as needed for muscle spasms.   Multiple Vitamin (MULTIVITAMIN) tablet Take 1 tablet by mouth daily.   olmesartan (BENICAR) 40 MG tablet TAKE 1 TABLET BY MOUTH  EVERY NIGHT FOR BLOOD  PRESSURE AND DIABETIC  KIDNEY PROTECTION   ONETOUCH ULTRA test strip USE TO CHECK BLOOD SUGAR  DAILY   pantoprazole (PROTONIX) 40 MG tablet TAKE 1 TABLET DAILY TO  PREVENT HEARTBURN &amp;  INDIGESTION   rOPINIRole (REQUIP) 0.25 MG tablet Take 1 tablet (0.25 mg) 1 hour before bedtime.   sennosides-docusate sodium (SENOKOT-S) 8.6-50 MG tablet Take 1 tablet by mouth daily.   tamsulosin (FLOMAX) 0.4 MG CAPS capsule TAKE 2 CAPSULES (0.08 mg) BY MOUTH AT  BEDTIME FOR PROSTATE   Vitamin D, Ergocalciferol, (DRISDOL) 50000 units CAPS capsule Take 1 capsule (50,000 Units total) by mouth as directed. Tuesday , Wednesday, Thursday, Sunday (Patient taking differently: Take 50,000 Units by mouth 2 (two) times a week. Wednesday and Sunday)   No current facility-administered medications on file prior to visit.    ROS: all negative except above.   Physical Exam:  There were no vitals taken for this visit.  General Appearance: Well nourished, in no apparent distress. Eyes: PERRLA, EOMs, conjunctiva no swelling or erythema Sinuses: No Frontal/maxillary tenderness ENT/Mouth: Ext aud canals clear, TMs without erythema, bulging. No erythema, swelling, or exudate on post pharynx.  Tonsils not swollen or erythematous. Hearing normal.  Neck: Supple, thyroid normal.  Respiratory: Respiratory effort normal, BS equal bilaterally without rales, rhonchi, wheezing or stridor.  Cardio: RRR with no MRGs. Brisk peripheral pulses without edema.  Abdomen: Soft, + BS.  Non tender, no guarding, rebound, hernias, masses. Lymphatics: Non tender without lymphadenopathy.  Musculoskeletal: Full ROM, 5/5 strength, normal gait.  Skin: Warm, dry without rashes, lesions, ecchymosis.  Neuro: Cranial nerves  intact. Normal muscle tone, no cerebellar symptoms. Sensation intact.  Psych: Awake and oriented X 3, normal affect, Insight and Judgment appropriate.     Alycia Rossetti, NP 10:34 AM [facility]

## 2021-07-06 ENCOUNTER — Encounter: Payer: Self-pay | Admitting: Nurse Practitioner

## 2021-07-06 ENCOUNTER — Ambulatory Visit (INDEPENDENT_AMBULATORY_CARE_PROVIDER_SITE_OTHER): Payer: Medicare Other | Admitting: Nurse Practitioner

## 2021-07-06 VITALS — BP 140/80 | HR 84 | Temp 97.5°F | Wt 311.2 lb

## 2021-07-06 DIAGNOSIS — R710 Precipitous drop in hematocrit: Secondary | ICD-10-CM | POA: Diagnosis not present

## 2021-07-06 DIAGNOSIS — I10 Essential (primary) hypertension: Secondary | ICD-10-CM

## 2021-07-06 DIAGNOSIS — E538 Deficiency of other specified B group vitamins: Secondary | ICD-10-CM | POA: Diagnosis not present

## 2021-07-06 DIAGNOSIS — Z9989 Dependence on other enabling machines and devices: Secondary | ICD-10-CM

## 2021-07-06 DIAGNOSIS — Z79899 Other long term (current) drug therapy: Secondary | ICD-10-CM

## 2021-07-06 DIAGNOSIS — G4733 Obstructive sleep apnea (adult) (pediatric): Secondary | ICD-10-CM | POA: Diagnosis not present

## 2021-07-06 DIAGNOSIS — G9331 Postviral fatigue syndrome: Secondary | ICD-10-CM | POA: Diagnosis not present

## 2021-07-06 DIAGNOSIS — E559 Vitamin D deficiency, unspecified: Secondary | ICD-10-CM | POA: Diagnosis not present

## 2021-07-06 MED ORDER — VITAMIN D (ERGOCALCIFEROL) 1.25 MG (50000 UNIT) PO CAPS: ORAL_CAPSULE | ORAL | 1 refills | Status: DC

## 2021-07-06 NOTE — Patient Instructions (Signed)
Fatigue If you have fatigue, you feel tired all the time and have a lack of energy or a lack of motivation. Fatigue may make it difficult to start or complete tasks because of exhaustion. Occasional or mild fatigue is often a normal response to activity or life. However, long-term (chronic) or extreme fatigue may be a symptom of a medical condition such as: Depression. Not having enough red blood cells or hemoglobin in the blood (anemia). A problem with a small gland located in the lower front part of the neck (thyroid disorder). Rheumatologic conditions. These are problems related to the body's defense system (immune system). Infections, especially certain viral infections. Fatigue can also lead to negative health outcomes over time. Follow these instructions at home: Medicines Take over-the-counter and prescription medicines only as told by your health care provider. Take a multivitamin if told by your health care provider. Do not use herbal or dietary supplements unless they are approved by your health care provider. Eating and drinking  Avoid heavy meals in the evening. Eat a well-balanced diet, which includes lean proteins, whole grains, plenty of fruits and vegetables, and low-fat dairy products. Avoid eating or drinking too many products with caffeine in them. Avoid alcohol. Drink enough fluid to keep your urine pale yellow. Activity  Exercise regularly, as told by your health care provider. Use or practice techniques to help you relax, such as yoga, tai chi, meditation, or massage therapy. Lifestyle Change situations that cause you stress. Try to keep your work and personal schedules in balance. Do not use recreational or illegal drugs. General instructions Monitor your fatigue for any changes. Go to bed and get up at the same time every day. Avoid fatigue by pacing yourself during the day and getting enough sleep at night. Maintain a healthy weight. Contact a health care  provider if: Your fatigue does not get better. You have a fever. You suddenly lose or gain weight. You have headaches. You have trouble falling asleep or sleeping through the night. You feel angry, guilty, anxious, or sad. You have swelling in your legs or another part of your body. Get help right away if: You feel confused, feel like you might faint, or faint. Your vision is blurry or you have a severe headache. You have severe pain in your abdomen, your back, or the area between your waist and hips (pelvis). You have chest pain, shortness of breath, or an irregular or fast heartbeat. You are unable to urinate, or you urinate less than normal. You have abnormal bleeding from the rectum, nose, lungs, nipples, or, if you are male, the vagina. You vomit blood. You have thoughts about hurting yourself or others. These symptoms may be an emergency. Get help right away. Call 911. Do not wait to see if the symptoms will go away. Do not drive yourself to the hospital. Get help right away if you feel like you may hurt yourself or others, or have thoughts about taking your own life. Go to your nearest emergency room or: Call 911. Call the [facility] at [phone] or 988. This is open 24 hours a day. Text the Crisis Text Line at 979-823-0385. Summary If you have fatigue, you feel tired all the time and have a lack of energy or a lack of motivation. Fatigue may make it difficult to start or complete tasks because of exhaustion. Long-term (chronic) or extreme fatigue may be a symptom of a medical condition. Exercise regularly, as told by your health care provider.  Change situations that cause you stress. Try to keep your work and personal schedules in balance. This information is not intended to replace advice given to you by your health care provider. Make sure you discuss any questions you have with your health care provider. Document Revised: 10/25/2020 Document  Reviewed: 10/25/2020 Elsevier Patient Education  2023 Elsevier Inc.  

## 2021-07-07 ENCOUNTER — Other Ambulatory Visit: Payer: Self-pay | Admitting: Nurse Practitioner

## 2021-07-07 DIAGNOSIS — G9331 Postviral fatigue syndrome: Secondary | ICD-10-CM

## 2021-07-07 DIAGNOSIS — R790 Abnormal level of blood mineral: Secondary | ICD-10-CM

## 2021-07-07 LAB — IRON,TIBC AND FERRITIN PANEL
%SAT: 17 % (calc) — ABNORMAL LOW (ref 20–48)
Ferritin: 10 ng/mL — ABNORMAL LOW (ref 24–380)
Iron: 64 ug/dL (ref 50–180)
TIBC: 384 mcg/dL (calc) (ref 250–425)

## 2021-07-07 LAB — CBC WITH DIFFERENTIAL/PLATELET
Absolute Monocytes: 494 cells/uL (ref 200–950)
Basophils Absolute: 38 cells/uL (ref 0–200)
Basophils Relative: 0.5 %
Eosinophils Absolute: 38 cells/uL (ref 15–500)
Eosinophils Relative: 0.5 %
HCT: 37.3 % — ABNORMAL LOW (ref 38.5–50.0)
Hemoglobin: 12.6 g/dL — ABNORMAL LOW (ref 13.2–17.1)
Lymphs Abs: 912 cells/uL (ref 850–3900)
MCH: 30.2 pg (ref 27.0–33.0)
MCHC: 33.8 g/dL (ref 32.0–36.0)
MCV: 89.4 fL (ref 80.0–100.0)
MPV: 10.2 fL (ref 7.5–12.5)
Monocytes Relative: 6.5 %
Neutro Abs: 6118 cells/uL (ref 1500–7800)
Neutrophils Relative %: 80.5 %
Platelets: 280 10*3/uL (ref 140–400)
RBC: 4.17 10*6/uL — ABNORMAL LOW (ref 4.20–5.80)
RDW: 14.8 % (ref 11.0–15.0)
Total Lymphocyte: 12 %
WBC: 7.6 10*3/uL (ref 3.8–10.8)

## 2021-07-07 LAB — TEST AUTHORIZATION

## 2021-07-07 LAB — VITAMIN D 25 HYDROXY (VIT D DEFICIENCY, FRACTURES): Vit D, 25-Hydroxy: 102 ng/mL — ABNORMAL HIGH (ref 30–100)

## 2021-07-07 LAB — TSH: TSH: 1.87 mIU/L (ref 0.40–4.50)

## 2021-07-07 LAB — VITAMIN B12: Vitamin B-12: >2000 pg/mL — ABNORMAL HIGH (ref 200–1100)

## 2021-07-08 ENCOUNTER — Telehealth: Payer: Self-pay | Admitting: Oncology

## 2021-07-08 ENCOUNTER — Telehealth: Payer: Self-pay

## 2021-07-08 NOTE — Telephone Encounter (Signed)
Patient assistance for Tr[employer] approved through the end of the 2023 calendar year.

## 2021-07-12 ENCOUNTER — Other Ambulatory Visit: Payer: Self-pay | Admitting: *Deleted

## 2021-07-12 DIAGNOSIS — D509 Iron deficiency anemia, unspecified: Secondary | ICD-10-CM

## 2021-07-12 NOTE — Progress Notes (Signed)
New pt appt lab orders entered

## 2021-07-14 DIAGNOSIS — M25511 Pain in right shoulder: Secondary | ICD-10-CM | POA: Diagnosis not present

## 2021-07-14 DIAGNOSIS — M25512 Pain in left shoulder: Secondary | ICD-10-CM | POA: Diagnosis not present

## 2021-07-15 ENCOUNTER — Telehealth: Payer: Self-pay | Admitting: Nurse Practitioner

## 2021-07-15 ENCOUNTER — Other Ambulatory Visit: Payer: Self-pay | Admitting: Adult Health

## 2021-07-15 ENCOUNTER — Encounter: Payer: Self-pay | Admitting: Adult Health

## 2021-07-15 MED ORDER — ONETOUCH ULTRA 2 W/DEVICE KIT: PACK | 0 refills | Status: DC

## 2021-07-15 NOTE — Telephone Encounter (Signed)
Pt is needing a new onetouch glucose monitor device saying his isn't working

## 2021-07-16 ENCOUNTER — Other Ambulatory Visit: Payer: Self-pay | Admitting: Nurse Practitioner

## 2021-07-16 MED ORDER — ONETOUCH ULTRA 2 W/DEVICE KIT
PACK | 0 refills | Status: DC
Start: 2021-07-16 — End: 2021-07-20

## 2021-07-20 ENCOUNTER — Other Ambulatory Visit: Payer: Self-pay | Admitting: Nurse Practitioner

## 2021-07-20 MED ORDER — ACCU-CHEK SOFTCLIX LANCETS MISC
3 refills | Status: DC
Start: 2021-07-20 — End: 2021-09-15

## 2021-07-20 MED ORDER — ONETOUCH ULTRA 2 W/DEVICE KIT: PACK | 0 refills | Status: DC

## 2021-07-20 MED ORDER — ONETOUCH ULTRA VI STRP: ORAL_STRIP | 3 refills | Status: DC

## 2021-07-20 NOTE — Progress Notes (Unsigned)
New Hematology/Oncology Consult   Requesting MD: Jason Wilkinson, NP  [phone]      Reason for Consult: Decreased iron stores  HPI: Jason Wyatt is a 66-year-old man referred for evaluation of decreased iron stores.  He saw Jason Wilkinson, NP for evaluation of fatigue on 07/06/2021.  CBC showed hemoglobin 12.6, MCV 89, white count 7.6, platelet count 280,000; ferritin 10, iron 64, TIBC 384, percent saturation 17.  Comparison CBC 06/02/2021 hemoglobin 12.6; comparison iron studies 03/04/2020 ferritin 8, iron 63, TIBC 416, percent saturation 15.  He is not aware of any bleeding.  He reports being up-to-date on screening colonoscopy.  He reports negative stool cards from December 2022.  He eats a regular diet.  No abdominal surgeries.  No tongue soreness.  He does note nails are somewhat brittle.  Main complaints are fatigue, shortness of breath and chest discomfort with exertion.  He has had those symptoms since he was diagnosed with COVID 1 year ago.  Also notes continued loss of taste and smell.  Colonoscopy 09/17/2014-three 5 to 7 mm polyps in the transverse colon and hepatic flexure (tubular adenomas hepatic flexure colon polyp); two 5 to 8 mm polyps in the sigmoid colon and the descending colon (tubular adenomas).   Past Medical History:  Diagnosis Date   Acute meniscal tear of left knee    Allergic rhinitis    Arthritis     back   Borderline glaucoma    Chronic back pain    stenosis   CKD (chronic kidney disease), stage II    Complication of anesthesia cervical fusion surgery 06/2010   "woke up next day w/ventilator on" told due to OSA   GERD (gastroesophageal reflux disease)    takes Prevacid daily   Gout    takes Allopurinol daily and Colchicine if needed   History of adenomatous polyp of colon    tubular adenoma's   History of Barrett's esophagus    History of hiatal hernia    Hyperlipidemia    Hypertension    Incomplete right bundle branch block (RBBB)    OA (osteoarthritis)     knee   OSA on CPAP    Peripheral neuropathy    Prostate cancer (HCC) urologist-  dr borden   dx 2013  s/p  radical non-nerve sparing prostatectomy 2014--  Stage pT2cNx,  PSA 3.48,  Gleason 3+3,  vol 55cc/  current PSA  0.01 (Aug 2016)   Rupture of Achilles tendon    Thyroid nodule    left side   Type II diabetes mellitus (HCC)    Urinary frequency    takes Flomax daily   Urine incontinence    Wears glasses      Past Surgical History:  Procedure Laterality Date   ACHILLES TENDON SURGERY Left 06/27/2017   Procedure: ACHILLES TENDON REPAIR;  Surgeon: Price, Michael J, DPM;  Location: [facility];  Service: Podiatry;  Laterality: Left;   ANAL FISSURE REPAIR  06-05-2002   BACK SURGERY     CARDIAC CATHETERIZATION     CARDIOVASCULAR STRESS TEST  01-01-2008   normal nuclear study/  no ischemia/  normal LV function and wall motion , 57%   CARPAL TUNNEL RELEASE Left 03/27/2018   CARPAL TUNNEL RELEASE Right 06/2018   ESOPHAGOGASTRODUODENOSCOPY  09/26/2011   Procedure: ESOPHAGOGASTRODUODENOSCOPY (EGD);  Surgeon: Robert V Buccini, MD;  Location: [facility];  Service: Endoscopy;  Laterality: N/A;   GASTROC RECESSION EXTREMITY Left 06/27/2017   Procedure: GASTROC RECESSION EXTREMITY;  Surgeon: Price, Michael J,   DPM;  Location: [facility];  Service: Podiatry;  Laterality: Left;   KNEE ARTHROSCOPY WITH MEDIAL MENISECTOMY Left 12/31/2014   Procedure: LEFT KNEE ARTHROSCOPY, PARTIAL MEDIAL AND LATERAL  MENISECTOMIES WITH DEDRIDEMENT;  Surgeon: Jeffrey Beane, MD;  Location: [facility];  Service: Orthopedics;  Laterality: Left;   LEFT AND RIGHT HEART CATHETERIZATION WITH CORONARY ANGIOGRAM N/A 08/25/2011   Procedure: LEFT AND RIGHT HEART CATHETERIZATION WITH CORONARY ANGIOGRAM;  Surgeon: Peter M Jordan, MD;  Location: [facility];  Service: Cardiovascular;  Laterality: N/A;   Normal coronary arteries and LVF, ef 55-60%   POSTERIOR FUSION CERVICAL SPINE  07-05-2010   C1 --  C4   POSTERIOR  LUMBAR FUSION  05-18-2014   L4 -- S1   ROBOT ASSISTED LAPAROSCOPIC RADICAL PROSTATECTOMY  02/07/2012   Procedure: ROBOTIC ASSISTED LAPAROSCOPIC RADICAL PROSTATECTOMY LEVEL 1;  Surgeon: Daniel Young Woodruff, MD;  Location: [facility];  Service: Urology;  Laterality: N/A;      TENDON TRANSFER Left 06/27/2017   Procedure: TENDON TRANSFER;  Surgeon: Price, Michael J, DPM;  Location: [facility];  Service: Podiatry;  Laterality: Left;   TOTAL KNEE ARTHROPLASTY Left 08/05/2015   Procedure: LEFT TOTAL KNEE ARTHROPLASTY;  Surgeon: Jeffrey Beane, MD;  Location: [facility];  Service: Orthopedics;  Laterality: Left;   TRANSTHORACIC ECHOCARDIOGRAM  08-03-2011   mild LVH, ef 55-60%/  trivial TR and PR   UVULOPALATOPHARYNGOPLASTY (UPPP)/TONSILLECTOMY/SEPTOPLASTY  1990's   and Adenoidectomy   wisdom teeth extracted   1982     Current Outpatient Medications:    acetaminophen (TYLENOL) 500 MG tablet, Take 500 mg by mouth every 6 (six) hours as needed for moderate pain., Disp: , Rfl:    acyclovir (ZOVIRAX) 400 MG tablet, TAKE 1 TABLET BY MOUTH  DAILY AS NEEDED FOR COLD  SORES, Disp: 90 tablet, Rfl: 3   allopurinol (ZYLOPRIM) 300 MG tablet, TAKE 1 TABLET BY MOUTH  DAILY FOR GOUT PREVENTION, Disp: 90 tablet, Rfl: 3   aspirin 81 MG tablet, Take 81 mg by mouth daily., Disp: , Rfl:    atorvastatin (LIPITOR) 20 MG tablet, TAKE 1 TABLET BY MOUTH  DAILY, Disp: 90 tablet, Rfl: 3   bisoprolol (ZEBETA) 10 MG tablet, TAKE 1 TABLET BY MOUTH IN  THE MORNING FOR BLOOD  PRESSURE, Disp: 90 tablet, Rfl: 3   Blood Glucose Monitoring Suppl (ONE TOUCH ULTRA 2) w/Device KIT, SMARTSIG:Via Meter, Disp: 1 kit, Rfl: 0   carbidopa-levodopa (SINEMET IR) 25-250 MG tablet, TAKE 1 TABLET BY MOUTH  TWICE DAILY FOR RESTLESS  LEGS, Disp: 180 tablet, Rfl: 3   Cyanocobalamin (B-12) 2500 MCG SUBL, Place under the tongue every other day., Disp: , Rfl:    dextromethorphan-guaiFENesin (MUCINEX DM) 30-600 MG 12hr tablet, Take 1 tablet by mouth 2 (two) times  daily as needed for cough., Disp: , Rfl:    Dulaglutide (TRULICITY) 3 MG/0.5ML SOPN, INJECT THE CONTENTS OF ONE PEN  SUBCUTANEOUSLY WEEKLY AS  DIRECTED, Disp: 6 mL, Rfl: 3   DULoxetine (CYMBALTA) 60 MG capsule, Take 1 capsule (60 mg total) by mouth at bedtime., Disp: 90 capsule, Rfl: 3   fexofenadine (ALLEGRA) 180 MG tablet, Take 180 mg by mouth daily., Disp: , Rfl:    gabapentin (NEURONTIN) 600 MG tablet, TAKE 1/2 TO 1 TABLET BY  MOUTH 2 TO 3 TIMES DAILY AS NEEDED FOR PAIN, Disp: 270 tablet, Rfl: 3   glucose blood (ONETOUCH ULTRA) test strip, USE TO CHECK BLOOD SUGAR  DAILY, Disp: 100 strip, Rfl: 3   Magnesium   400 MG CAPS, Take by mouth., Disp: , Rfl:    meloxicam (MOBIC) 7.5 MG tablet, Take 1 tablet Daily  with Food  is needed for Pain, Disp: 20 tablet, Rfl: 2   metFORMIN (GLUCOPHAGE-XR) 500 MG 24 hr tablet, TAKE 2 TABLETS BY MOUTH  TWICE DAILY WITH MEALS FOR  DIABETES, Disp: 360 tablet, Rfl: 3   methocarbamol (ROBAXIN) 500 MG tablet, Take 1 tablet (500 mg total) by mouth daily as needed for muscle spasms., Disp: 20 tablet, Rfl: 2   Multiple Vitamin (MULTIVITAMIN) tablet, Take 1 tablet by mouth daily., Disp: , Rfl:    olmesartan (BENICAR) 40 MG tablet, TAKE 1 TABLET BY MOUTH  EVERY NIGHT FOR BLOOD  PRESSURE AND DIABETIC  KIDNEY PROTECTION, Disp: 90 tablet, Rfl: 3   pantoprazole (PROTONIX) 40 MG tablet, TAKE 1 TABLET DAILY TO  PREVENT HEARTBURN &amp;  INDIGESTION, Disp: 90 tablet, Rfl: 3   tamsulosin (FLOMAX) 0.4 MG CAPS capsule, TAKE 2 CAPSULES (0.08 mg) BY MOUTH AT  BEDTIME FOR PROSTATE, Disp: 180 capsule, Rfl: 3   Vitamin D, Ergocalciferol, (DRISDOL) 1.25 MG (50000 UNIT) CAPS capsule, 1 pill 2 days a week for vitamin d deficiency, Disp: 24 capsule, Rfl: 1   Accu-Chek Softclix Lancets lancets, USE WITH METER TO CHECK  BLOOD SUGAR ONCE DAILY (Patient not taking: Reported on 07/21/2021), Disp: 100 each, Rfl: 3   rOPINIRole (REQUIP) 0.25 MG tablet, Take 1 tablet (0.25 mg) 1 hour before bedtime.  (Patient not taking: Reported on 07/21/2021), Disp: 30 tablet, Rfl: 2   sennosides-docusate sodium (SENOKOT-S) 8.6-50 MG tablet, Take 1 tablet by mouth daily. (Patient not taking: Reported on 07/21/2021), Disp: , Rfl: :    Allergies  Allergen Reactions   Fenofibrate Itching and Rash   Flaxseed Oil Rash   Flaxseed [Flaxseed (Linseed)] Rash   Lyrica [Pregabalin] Itching and Rash   Niaspan [Niacin Er] Rash    FH: No family history of anemia or cancer.  SOCIAL HISTORY: He lives in [place].  He is married.  He has 2 children.  He previously worked as a Theme park manager.  No tobacco or alcohol use.  Review of Systems: No fevers.  He has periodic sweats.  No unusual headaches.  Vision improved following cataract surgery.  No dysphagia.  No change in bowel habits.  No hematuria.  He reports neuropathy related to diabetes.   Physical Exam:  Blood pressure 132/77, pulse 72, temperature 97.8 F (36.6 C), temperature source Oral, resp. rate 18, height 6' 1.5" (1.867 m), weight (!) 301 lb (136.5 kg), SpO2 98 %.  HEENT: No thrush or ulcers. Lungs: Lungs clear bilaterally. Cardiac: Regular rate and rhythm. Abdomen: No hepatosplenomegaly. Vascular: No leg edema.  Superficial varicosities at the lower leg bilaterally. Lymph nodes: No palpable cervical, supraclavicular or inguinal lymph nodes.  Bilateral axillary lymph nodes versus prominent fat pads. Neurologic: Alert and oriented. Skin: No rash.  LABS:   Recent Labs    07/21/21 1318  WBC 14.4*  HGB 13.2  HCT 40.0  PLT 302  Peripheral blood smear-variation in red cell size, few ovalocytes, rare teardrop; white blood cells appear increased, mostly mature neutrophils; platelets appear adequate, platelet stain is light.  No results for input(s): "NA", "K", "CL", "CO2", "GLUCOSE", "BUN", "CREATININE", "CALCIUM" in the last 72 hours.    RADIOLOGY:  No results found.  Assessment and Plan:   Mild anemia Low ferritin 07/06/2021,  03/04/2020 03/16/2020 negative stool Hemoccult cards Diabetes Hypertension CKD  Jason Wyatt was referred for evaluation of low  iron stores.  He was mildly anemic at the time of the referral.  Hemoglobin is in normal range today.  He will complete stool cards and submit a urine specimen to evaluate for a source of blood loss.  He will begin ferrous sulfate 325 mg twice daily.  The elevated white count is likely due to a recent steroid shoulder injection.  He will return for follow-up in approximately 6 weeks.  Patient seen with Dr. Sherrill.    Lisa Thomas, NP 07/21/2021, 3:33 PM   This was a shared visit with Lisa Thomas.  Jason Wyatt was interviewed and examined.  He is referred for evaluation of mild anemia and a low ferritin level.  The hemoglobin is in the low normal range today.  The ferritin is low.  The elevated white count today is likely related to a recent steroid injection.  He appears to have iron deficiency anemia.  No apparent source for blood loss upon review of his history and physical exam today.  He will return stool Hemoccult cards and begin iron replacement.  I was present for greater than 50% of today's visit.  I performed medical decision making.  Brad Sherrill, MD 

## 2021-07-21 ENCOUNTER — Telehealth: Payer: Self-pay

## 2021-07-21 ENCOUNTER — Inpatient Hospital Stay: Payer: Medicare Other | Attending: Oncology

## 2021-07-21 ENCOUNTER — Inpatient Hospital Stay: Payer: Medicare Other | Admitting: Nurse Practitioner

## 2021-07-21 ENCOUNTER — Encounter: Payer: Self-pay | Admitting: Nurse Practitioner

## 2021-07-21 VITALS — BP 132/77 | HR 72 | Temp 97.8°F | Resp 18 | Ht 73.5 in | Wt 301.0 lb

## 2021-07-21 DIAGNOSIS — I129 Hypertensive chronic kidney disease with stage 1 through stage 4 chronic kidney disease, or unspecified chronic kidney disease: Secondary | ICD-10-CM | POA: Insufficient documentation

## 2021-07-21 DIAGNOSIS — E611 Iron deficiency: Secondary | ICD-10-CM | POA: Insufficient documentation

## 2021-07-21 DIAGNOSIS — N182 Chronic kidney disease, stage 2 (mild): Secondary | ICD-10-CM

## 2021-07-21 DIAGNOSIS — E1122 Type 2 diabetes mellitus with diabetic chronic kidney disease: Secondary | ICD-10-CM | POA: Insufficient documentation

## 2021-07-21 DIAGNOSIS — D649 Anemia, unspecified: Secondary | ICD-10-CM | POA: Diagnosis not present

## 2021-07-21 DIAGNOSIS — D509 Iron deficiency anemia, unspecified: Secondary | ICD-10-CM

## 2021-07-21 LAB — CBC WITH DIFFERENTIAL (CANCER CENTER ONLY)
Abs Immature Granulocytes: 0.11 10*3/uL — ABNORMAL HIGH (ref 0.00–0.07)
Basophils Absolute: 0.1 10*3/uL (ref 0.0–0.1)
Basophils Relative: 0 %
Eosinophils Absolute: 0.1 10*3/uL (ref 0.0–0.5)
Eosinophils Relative: 1 %
HCT: 40 % (ref 39.0–52.0)
Hemoglobin: 13.2 g/dL (ref 13.0–17.0)
Immature Granulocytes: 1 %
Lymphocytes Relative: 12 %
Lymphs Abs: 1.7 10*3/uL (ref 0.7–4.0)
MCH: 30.1 pg (ref 26.0–34.0)
MCHC: 33 g/dL (ref 30.0–36.0)
MCV: 91.1 fL (ref 80.0–100.0)
Monocytes Absolute: 0.7 10*3/uL (ref 0.1–1.0)
Monocytes Relative: 5 %
Neutro Abs: 11.8 10*3/uL — ABNORMAL HIGH (ref 1.7–7.7)
Neutrophils Relative %: 81 %
Platelet Count: 302 10*3/uL (ref 150–400)
RBC: 4.39 MIL/uL (ref 4.22–5.81)
RDW: 15.8 % — ABNORMAL HIGH (ref 11.5–15.5)
WBC Count: 14.4 10*3/uL — ABNORMAL HIGH (ref 4.0–10.5)
nRBC: 0 % (ref 0.0–0.2)

## 2021-07-21 LAB — SAVE SMEAR(SSMR), FOR PROVIDER SLIDE REVIEW

## 2021-07-21 LAB — FERRITIN: Ferritin: 10 ng/mL — ABNORMAL LOW (ref 24–336)

## 2021-07-21 LAB — URINALYSIS, COMPLETE (UACMP) WITH MICROSCOPIC
Bilirubin Urine: NEGATIVE
Glucose, UA: NEGATIVE mg/dL
Hgb urine dipstick: NEGATIVE
Ketones, ur: NEGATIVE mg/dL
Leukocytes,Ua: NEGATIVE
Nitrite: NEGATIVE
Protein, ur: 30 mg/dL — AB
Specific Gravity, Urine: 1.026 (ref 1.005–1.030)
pH: 5.5 (ref 5.0–8.0)

## 2021-07-21 NOTE — Telephone Encounter (Signed)
-----   Message from Owens Shark, NP sent at 07/21/2021  4:21 PM EDT ----- Please instruct him to begin ferrous sulfate 325 mg twice daily.  Oral iron can cause constipation.  He can take a stool softener if needed.

## 2021-07-21 NOTE — Telephone Encounter (Signed)
Patient gave verbal understanding and had no further questions or concerns  

## 2021-07-22 ENCOUNTER — Telehealth: Payer: Self-pay | Admitting: Nurse Practitioner

## 2021-07-22 DIAGNOSIS — E1122 Type 2 diabetes mellitus with diabetic chronic kidney disease: Secondary | ICD-10-CM | POA: Diagnosis not present

## 2021-07-22 DIAGNOSIS — I129 Hypertensive chronic kidney disease with stage 1 through stage 4 chronic kidney disease, or unspecified chronic kidney disease: Secondary | ICD-10-CM | POA: Diagnosis not present

## 2021-07-22 DIAGNOSIS — D649 Anemia, unspecified: Secondary | ICD-10-CM | POA: Diagnosis not present

## 2021-07-22 DIAGNOSIS — N182 Chronic kidney disease, stage 2 (mild): Secondary | ICD-10-CM | POA: Diagnosis not present

## 2021-07-22 DIAGNOSIS — E611 Iron deficiency: Secondary | ICD-10-CM | POA: Diagnosis not present

## 2021-07-22 NOTE — Telephone Encounter (Signed)
Attempted to contact patient in regards to scheduling follow up from initial visit 7/6. No answer so voicemail was left for patient to call back

## 2021-07-25 ENCOUNTER — Telehealth: Payer: Self-pay | Admitting: Nurse Practitioner

## 2021-07-25 ENCOUNTER — Other Ambulatory Visit (HOSPITAL_BASED_OUTPATIENT_CLINIC_OR_DEPARTMENT_OTHER): Payer: Self-pay

## 2021-07-25 ENCOUNTER — Telehealth: Payer: Self-pay

## 2021-07-25 DIAGNOSIS — D509 Iron deficiency anemia, unspecified: Secondary | ICD-10-CM

## 2021-07-25 LAB — OCCULT BLOOD X 1 CARD TO LAB, STOOL
Fecal Occult Bld: NEGATIVE
Fecal Occult Bld: NEGATIVE
Fecal Occult Bld: NEGATIVE

## 2021-07-25 NOTE — Telephone Encounter (Signed)
-----   Message from Owens Shark, NP sent at 07/25/2021  8:30 AM EDT ----- Please let him know the stool cards were negative for blood.  I do not see where he has been scheduled for follow-up.  Please make sure he has a follow-up appointment.  Thanks

## 2021-07-25 NOTE — Telephone Encounter (Signed)
ttempted to contact patient in regards to scheduling follow up from initial visit 7/6. No answer so voicemail was left for patient to call back

## 2021-07-25 NOTE — Telephone Encounter (Signed)
Patient gave verbal understanding and had no further questions or concerns at this time 

## 2021-08-03 ENCOUNTER — Other Ambulatory Visit: Payer: Self-pay | Admitting: Nurse Practitioner

## 2021-08-03 DIAGNOSIS — G2581 Restless legs syndrome: Secondary | ICD-10-CM

## 2021-08-04 ENCOUNTER — Ambulatory Visit: Payer: Medicare Other | Admitting: Podiatry

## 2021-08-04 ENCOUNTER — Encounter: Payer: Self-pay | Admitting: Podiatry

## 2021-08-04 DIAGNOSIS — D2371 Other benign neoplasm of skin of right lower limb, including hip: Secondary | ICD-10-CM | POA: Diagnosis not present

## 2021-08-04 DIAGNOSIS — M2042 Other hammer toe(s) (acquired), left foot: Secondary | ICD-10-CM

## 2021-08-04 DIAGNOSIS — E1142 Type 2 diabetes mellitus with diabetic polyneuropathy: Secondary | ICD-10-CM | POA: Diagnosis not present

## 2021-08-04 DIAGNOSIS — B351 Tinea unguium: Secondary | ICD-10-CM | POA: Diagnosis not present

## 2021-08-04 DIAGNOSIS — M79676 Pain in unspecified toe(s): Secondary | ICD-10-CM | POA: Diagnosis not present

## 2021-08-04 DIAGNOSIS — D2372 Other benign neoplasm of skin of left lower limb, including hip: Secondary | ICD-10-CM

## 2021-08-04 DIAGNOSIS — M2041 Other hammer toe(s) (acquired), right foot: Secondary | ICD-10-CM

## 2021-08-04 NOTE — Progress Notes (Signed)
He presents today chief complaint of painful elongated toenails and calluses bilaterally.  States that his feet does look worse than they ever have.  Objective: Vital signs are stable he is alert oriented x3 pulses are strongly palpable bilateral.  He has digital deformities such as hammertoe deformities and plantarflexed metatarsals first and fifth bilateral this is resulting in hyperkeratosis plantar aspect of the foot.  Toenails are long thick yellow dystrophic-like mycotic vascular status is diminished per Semmes Weinstein monofilament to the forefoot bilateral.  Assessment: Diabetes mellitus diabetic peripheral neuropathy with diabetic digital deformities and metatarsalgia.  Pain in limb secondary to onychomycosis as well as benign skin lesions.  Plan: Debridement of toenails 1 through 5 bilateral debridement of benign skin lesions bilateral x4  I would like to get him into another pair of diabetic shoes.

## 2021-08-29 DIAGNOSIS — G4733 Obstructive sleep apnea (adult) (pediatric): Secondary | ICD-10-CM | POA: Diagnosis not present

## 2021-08-31 ENCOUNTER — Inpatient Hospital Stay: Payer: Medicare Other | Attending: Oncology

## 2021-08-31 ENCOUNTER — Inpatient Hospital Stay: Payer: Medicare Other | Admitting: Oncology

## 2021-08-31 VITALS — BP 144/78 | HR 80 | Temp 98.1°F | Resp 18 | Ht 73.0 in | Wt 297.2 lb

## 2021-08-31 DIAGNOSIS — I129 Hypertensive chronic kidney disease with stage 1 through stage 4 chronic kidney disease, or unspecified chronic kidney disease: Secondary | ICD-10-CM | POA: Insufficient documentation

## 2021-08-31 DIAGNOSIS — E1122 Type 2 diabetes mellitus with diabetic chronic kidney disease: Secondary | ICD-10-CM | POA: Diagnosis not present

## 2021-08-31 DIAGNOSIS — D509 Iron deficiency anemia, unspecified: Secondary | ICD-10-CM

## 2021-08-31 DIAGNOSIS — N189 Chronic kidney disease, unspecified: Secondary | ICD-10-CM | POA: Insufficient documentation

## 2021-08-31 DIAGNOSIS — E611 Iron deficiency: Secondary | ICD-10-CM | POA: Insufficient documentation

## 2021-08-31 DIAGNOSIS — D649 Anemia, unspecified: Secondary | ICD-10-CM | POA: Diagnosis not present

## 2021-08-31 LAB — CBC WITH DIFFERENTIAL (CANCER CENTER ONLY)
Abs Immature Granulocytes: 0.05 10*3/uL (ref 0.00–0.07)
Basophils Absolute: 0.1 10*3/uL (ref 0.0–0.1)
Basophils Relative: 1 %
Eosinophils Absolute: 0.1 10*3/uL (ref 0.0–0.5)
Eosinophils Relative: 1 %
HCT: 40.2 % (ref 39.0–52.0)
Hemoglobin: 13.5 g/dL (ref 13.0–17.0)
Immature Granulocytes: 1 %
Lymphocytes Relative: 19 %
Lymphs Abs: 1.7 10*3/uL (ref 0.7–4.0)
MCH: 31.1 pg (ref 26.0–34.0)
MCHC: 33.6 g/dL (ref 30.0–36.0)
MCV: 92.6 fL (ref 80.0–100.0)
Monocytes Absolute: 0.6 10*3/uL (ref 0.1–1.0)
Monocytes Relative: 7 %
Neutro Abs: 6.2 10*3/uL (ref 1.7–7.7)
Neutrophils Relative %: 71 %
Platelet Count: 258 10*3/uL (ref 150–400)
RBC: 4.34 MIL/uL (ref 4.22–5.81)
RDW: 15.5 % (ref 11.5–15.5)
WBC Count: 8.8 10*3/uL (ref 4.0–10.5)
nRBC: 0 % (ref 0.0–0.2)

## 2021-08-31 LAB — FERRITIN: Ferritin: 12 ng/mL — ABNORMAL LOW (ref 24–336)

## 2021-08-31 NOTE — Progress Notes (Signed)
  [facility] OFFICE PROGRESS NOTE   Diagnosis: Iron deficiency  INTERVAL HISTORY:   Jason Wyatt returns as scheduled.  He is taking iron once daily.  He denies bleeding.  He reports exertional dyspnea.  He is scheduled see Dr. Martinique next week.  No bleeding.  Objective:  Vital signs in last 24 hours:  Blood pressure (!) 144/78, pulse 80, temperature 98.1 F (36.7 C), temperature source Oral, resp. rate 18, height '6\' 1"'$  (1.854 m), weight 297 lb 3.2 oz (134.8 kg), SpO2 100 %.    HEENT: No thrush or ulcers, erythema at the posterior palate Resp: Lungs clear bilaterally Cardio: Regular rate and rhythm GI: No hepatosplenomegaly Vascular: No leg edema   Lab Results:  Lab Results  Component Value Date   WBC 8.8 08/31/2021   HGB 13.5 08/31/2021   HCT 40.2 08/31/2021   MCV 92.6 08/31/2021   PLT 258 08/31/2021   NEUTROABS 6.2 08/31/2021    CMP  Lab Results  Component Value Date   NA 139 06/02/2021   K 4.8 06/02/2021   CL 102 06/02/2021   CO2 27 06/02/2021   GLUCOSE 147 (H) 06/02/2021   BUN 15 06/02/2021   CREATININE 0.98 06/02/2021   CALCIUM 9.8 06/02/2021   PROT 6.5 06/02/2021   ALBUMIN 4.3 06/08/2016   AST 15 06/02/2021   ALT 16 06/02/2021   ALKPHOS 66 06/08/2016   BILITOT 0.5 06/02/2021   GFRNONAA >60 08/16/2020   GFRAA 93 07/22/2020     Medications: I have reviewed the patient's current medications.   Assessment/Plan: Mild anemia Low ferritin 07/06/2021, 03/04/2020 03/16/2020, 07/22/2021 negative stool Hemoccult cards Diabetes Hypertension CKD    Disposition: Jason Wyatt has persistent iron deficiency.  He is not anemic.  I doubt the exertional dyspnea is related to iron deficiency.  There is no apparent source for blood loss.  He has a history of gastritis and colon polyps.  He last underwent an EGD and colonoscopy in 2016.  I will refer him to Dr. Watt Climes to consider a repeat endoscopic evaluation.  He will increase the ferrous sulfate to 3  times daily.  Jason Wyatt will return for an office and lab visit in 3 months.  He will see cardiology to evaluate the exertional dyspnea.  Betsy Coder, MD  08/31/2021  9:35 AM

## 2021-09-02 ENCOUNTER — Other Ambulatory Visit: Payer: Self-pay | Admitting: Nurse Practitioner

## 2021-09-05 NOTE — Progress Notes (Signed)
Cardiology Office Note:    Date:  09/09/2021   ID:  Jason Wyatt, DOB [birth year], MRN [id number]  PCP:  Unk Pinto, [place] [facility] Cardiologist:  Quay Burow, MD     Referring MD: Unk Pinto, MD   Chief Complaint  Patient presents with   Shortness of Breath    History of Present Illness:    Jason Wyatt is a 66 y.o. male with a hx of OSA, HTN and HLD seen at the request of Dr Melford Aase for cardiac evaluation. He had a normal Reardan in 2013. Echo in 2020 showed mild LVH with some diastolic dysfunction. Otherwise normal. Seen by Dr Gwenlyn Found at that time.   On follow up today his main complaint is of dyspnea and chest tightness with exertion. States this has been going on for more than a year. He did have Covid last year but these symptoms pre date that. Cannot do house work such as vacuuming without stopping to rest. No edema. Weight has actually gone down. Rare palpitations. He uses CPAP religiously. He is under stress with wife currently in Hospice care.   Past Medical History:  Diagnosis Date   Acute meniscal tear of left knee    Allergic rhinitis    Arthritis     back   Borderline glaucoma    Chronic back pain    stenosis   CKD (chronic kidney disease), stage II    Complication of anesthesia cervical fusion surgery 06/2010   "woke up next day w/ventilator on" told due to OSA   GERD (gastroesophageal reflux disease)    takes Prevacid daily   Gout    takes Allopurinol daily and Colchicine if needed   History of adenomatous polyp of colon    tubular adenoma's   History of Barrett's esophagus    History of hiatal hernia    Hyperlipidemia    Hypertension    Incomplete right bundle branch block (RBBB)    OA (osteoarthritis)    knee   OSA on CPAP    Peripheral neuropathy    Prostate cancer [facility]) urologist-  dr Alinda Money   dx 2013  s/p  radical non-nerve sparing prostatectomy 2014--  Stage pT2cNx,  PSA 3.48,  Gleason 3+3,  vol 55cc/  current  PSA  0.01 (Aug 2016)   Rupture of Achilles tendon    Thyroid nodule    left side   Type II diabetes mellitus ([place])    Urinary frequency    takes Flomax daily   Urine incontinence    Wears glasses     Past Surgical History:  Procedure Laterality Date   ACHILLES TENDON SURGERY Left 06/27/2017   Procedure: ACHILLES TENDON REPAIR;  Surgeon: Evelina Bucy, DPM;  [facility];  Service: Podiatry;  Laterality: Left;   ANAL FISSURE REPAIR  06-05-2002   BACK SURGERY     CARDIAC CATHETERIZATION     CARDIOVASCULAR STRESS TEST  01-01-2008   normal nuclear study/  no ischemia/  normal LV function and wall motion , 57%   CARPAL TUNNEL RELEASE Left 03/27/2018   CARPAL TUNNEL RELEASE Right 06/2018   ESOPHAGOGASTRODUODENOSCOPY  09/26/2011   Procedure: ESOPHAGOGASTRODUODENOSCOPY (EGD);  Surgeon: Cleotis Nipper, MD;  Location: [facility];  Service: Endoscopy;  Laterality: N/A;   GASTROC RECESSION EXTREMITY Left 06/27/2017   Procedure: GASTROC RECESSION EXTREMITY;  Surgeon: Evelina Bucy, DPM;  [facility];  Service: Podiatry;  Laterality: Left;   KNEE ARTHROSCOPY WITH MEDIAL MENISECTOMY  Left 12/31/2014   Procedure: LEFT KNEE ARTHROSCOPY, PARTIAL MEDIAL AND LATERAL  MENISECTOMIES WITH DEDRIDEMENT;  Surgeon: Susa Day, MD;  Location: [place];  Service: Orthopedics;  Laterality: Left;   LEFT AND RIGHT HEART CATHETERIZATION WITH CORONARY ANGIOGRAM N/A 08/25/2011   Procedure: LEFT AND RIGHT HEART CATHETERIZATION WITH CORONARY ANGIOGRAM;  Surgeon: Bhavya Grand M Martinique, MD;  Location: [facility] CATH LAB;  Service: Cardiovascular;  Laterality: N/A;   Normal coronary arteries and LVF, ef 55-60%   POSTERIOR FUSION CERVICAL SPINE  07-05-2010   C1 --  C4   POSTERIOR LUMBAR FUSION  05-18-2014   L4 -- S1   ROBOT ASSISTED LAPAROSCOPIC RADICAL PROSTATECTOMY  02/07/2012   Procedure: ROBOTIC ASSISTED LAPAROSCOPIC RADICAL PROSTATECTOMY LEVEL 1;  Surgeon: Molli Hazard, MD;  [facility];  Service: Urology;  Laterality: N/A;      TENDON TRANSFER Left 06/27/2017   Procedure: TENDON TRANSFER;  Surgeon: Evelina Bucy, DPM;  [facility];  Service: Podiatry;  Laterality: Left;   TOTAL KNEE ARTHROPLASTY Left 08/05/2015   Procedure: LEFT TOTAL KNEE ARTHROPLASTY;  Surgeon: Susa Day, MD;  [facility];  Service: Orthopedics;  Laterality: Left;   TRANSTHORACIC ECHOCARDIOGRAM  08-03-2011   mild LVH, ef 55-60%/  trivial TR and PR   UVULOPALATOPHARYNGOPLASTY (UPPP)/TONSILLECTOMY/SEPTOPLASTY  1990's   and Adenoidectomy   wisdom teeth extracted   1982    Current Medications: Current Meds  Medication Sig   Accu-Chek Softclix Lancets lancets USE WITH METER TO CHECK  BLOOD SUGAR ONCE DAILY   acetaminophen (TYLENOL) 500 MG tablet Take 500 mg by mouth every 6 (six) hours as needed for moderate pain.   acyclovir (ZOVIRAX) 400 MG tablet TAKE 1 TABLET BY MOUTH  DAILY AS NEEDED FOR COLD  SORES   allopurinol (ZYLOPRIM) 300 MG tablet TAKE 1 TABLET BY MOUTH  DAILY FOR GOUT PREVENTION   aspirin 81 MG tablet Take 81 mg by mouth daily.   atorvastatin (LIPITOR) 20 MG tablet TAKE 1 TABLET BY MOUTH  DAILY   bisoprolol (ZEBETA) 10 MG tablet TAKE 1 TABLET BY MOUTH IN  THE MORNING FOR BLOOD  PRESSURE   Blood Glucose Monitoring Suppl (ONE TOUCH ULTRA 2) w/Device KIT SMARTSIG:Via Meter   carbidopa-levodopa (SINEMET IR) 25-250 MG tablet TAKE 1 TABLET BY MOUTH  TWICE DAILY FOR RESTLESS  LEGS   Cyanocobalamin (B-12) 2500 MCG SUBL Place under the tongue every other day.   dextromethorphan-guaiFENesin (MUCINEX DM) 30-600 MG 12hr tablet Take 1 tablet by mouth 2 (two) times daily as needed for cough.   Dulaglutide (TRULICITY) 3 FI/4.3PI SOPN INJECT THE CONTENTS OF ONE PEN  SUBCUTANEOUSLY WEEKLY AS  DIRECTED   DULoxetine (CYMBALTA) 60 MG capsule Take 1 capsule (60 mg total) by mouth at bedtime.   ferrous sulfate 325 (65 FE) MG tablet Take 325 mg by mouth daily with breakfast.   fexofenadine  (ALLEGRA) 180 MG tablet Take 180 mg by mouth daily.   gabapentin (NEURONTIN) 600 MG tablet TAKE 1/2 TO 1 TABLET BY  MOUTH 2 TO 3 TIMES DAILY AS NEEDED FOR PAIN   glucose blood (ONETOUCH ULTRA) test strip USE TO CHECK BLOOD SUGAR  DAILY   Magnesium 400 MG CAPS Take by mouth.   meloxicam (MOBIC) 7.5 MG tablet Take 1 tablet Daily  with Food  is needed for Pain   metFORMIN (GLUCOPHAGE-XR) 500 MG 24 hr tablet TAKE 2 TABLETS BY MOUTH  TWICE DAILY WITH MEALS FOR  DIABETES   methocarbamol (ROBAXIN) 500 MG  tablet Take 1 tablet (500 mg total) by mouth daily as needed for muscle spasms.   Multiple Vitamin (MULTIVITAMIN) tablet Take 1 tablet by mouth daily.   olmesartan (BENICAR) 40 MG tablet TAKE 1 TABLET BY MOUTH  EVERY NIGHT FOR BLOOD  PRESSURE AND DIABETIC  KIDNEY PROTECTION   pantoprazole (PROTONIX) 40 MG tablet TAKE 1 TABLET DAILY TO  PREVENT HEARTBURN &amp;  INDIGESTION   sennosides-docusate sodium (SENOKOT-S) 8.6-50 MG tablet Take 1 tablet by mouth daily.   tamsulosin (FLOMAX) 0.4 MG CAPS capsule TAKE 2 CAPSULES (0.08 mg) BY MOUTH AT  BEDTIME FOR PROSTATE   Vitamin D, Ergocalciferol, (DRISDOL) 1.25 MG (50000 UNIT) CAPS capsule TAKE 1 CAPSULE BY MOUTH 2 DAYS  WEEKLY FOR VITAMIN D DEFICIENCY     Allergies:   Fenofibrate, Flaxseed oil, Flaxseed [flaxseed (linseed)], Lyrica [pregabalin], and Niaspan [niacin er]   Social History   Socioeconomic History   Marital status: Married    Spouse name: Not on file   Number of children: 2   Years of education: Not on file   Highest education level: Some college, no degree  Occupational History   Not on file  Tobacco Use   Smoking status: Never   Smokeless tobacco: Never  Vaping Use   Vaping Use: Never used  Substance and Sexual Activity   Alcohol use: Never   Drug use: Never   Sexual activity: Not on file  Other Topics Concern   Not on file  Social History Narrative   Lives at home with wife   Caffeine: never   Social Determinants of Adult nurse Strain: Not on file  Food Insecurity: Not on file  Transportation Needs: Not on file  Physical Activity: Not on file  Stress: Not on file  Social Connections: Not on file     Family History: The patient's family history includes Cancer in his father; Dementia in his mother; Heart attack in his maternal grandmother; Hyperlipidemia in his sister; Hypertension in his mother, paternal grandfather, and sister; Osteoarthritis in his mother; Osteoporosis in his mother; Sleep apnea in his son and son; Stroke in his maternal grandmother.  ROS:   Please see the history of present illness.     All other systems reviewed and are negative.  EKGs/Labs/Other Studies Reviewed:    The following studies were reviewed today: Echo 03/06/18: IMPRESSIONS     1. The left ventricle has normal systolic function, with an ejection  fraction of 55-60%. The cavity size was normal. There is mildly increased  left ventricular wall thickness. Left ventricular diastolic Doppler  parameters are consistent with impaired  relaxation No evidence of left ventricular regional wall motion  abnormalities.   2. The right ventricle has normal systolic function. The cavity was  normal. There is no increase in right ventricular wall thickness.   3. Right atrial size was mildly dilated.   4. The mitral valve is normal in structure. No evidence of mitral valve  stenosis. No regurgitation.   5. The tricuspid valve is normal in structure.   6. The aortic valve is tricuspid no stenosis of the aortic valve.   7. The pulmonic valve was normal in structure.   8. The ascending aorta is normal in size and structure.   9. There is evidence of plaque in the descending aorta.  10. Normal IVC. PA systolic pressure 29 mmHg.   EKG:  EKG is  ordered today.  The ekg ordered today demonstrates NSR rate 73. LAD, RBBB.  Old inferior infarct. No change. I have personally reviewed and interpreted this study.   Recent  Labs: 09/06/2021: ALT 13; BUN 16; Creat 0.90; Hemoglobin 13.4; Magnesium 1.6; Platelets 282; Potassium 4.8; Sodium 142; TSH 2.24  Recent Lipid Panel    Component Value Date/Time   CHOL 102 09/06/2021 1414   TRIG 242 (H) 09/06/2021 1414   HDL 36 (L) 09/06/2021 1414   CHOLHDL 2.8 09/06/2021 1414   VLDL 41 (H) 06/08/2016 0953   LDLCALC 36 09/06/2021 1414     Risk Assessment/Calculations:                Physical Exam:    VS:  BP 122/64   Pulse 73   Ht '6\' 2"'  (1.88 m)   Wt 293 lb 3.2 oz (133 kg)   SpO2 98%   BMI 37.64 kg/m     Wt Readings from Last 3 Encounters:  09/09/21 293 lb 3.2 oz (133 kg)  09/06/21 294 lb 6.4 oz (133.5 kg)  08/31/21 297 lb 3.2 oz (134.8 kg)     GEN:  Well nourished, obese in no acute distress HEENT: Normal NECK: No JVD; No carotid bruits LYMPHATICS: No lymphadenopathy CARDIAC: RRR, no murmurs, rubs, gallops RESPIRATORY:  Clear to auscultation without rales, wheezing or rhonchi  ABDOMEN: Soft, non-tender, non-distended MUSCULOSKELETAL:  No edema; No deformity  SKIN: Warm and dry NEUROLOGIC:  Alert and oriented x 3 PSYCHIATRIC:  Normal affect   ASSESSMENT:    1. Dyspnea on exertion   2. Chest pain of uncertain etiology   3. Type 2 diabetes mellitus without complication, without long-term current use of insulin ([place])   4. Primary hypertension   5. Mixed hyperlipidemia    PLAN:    In order of problems listed above:  DOE and chest tightness on exertion. Differential includes CHF (although exam is OK), CAD, deconditioning, obesity, OSA, or pulmonary. Will assess with Echo. Obtain coronary CTA given multiple CV risk factors. Will follow up after above studies.  HTN controlled. DM on oral therapy. Last A1c 6.6% Morbid obesity.  OSA on CPAP HLD mixed. LDL is excellent. Chronically elevated triglycerides.            Medication Adjustments/Labs and Tests Ordered: Current medicines are reviewed at length with the patient today.  Concerns  regarding medicines are outlined above.  Orders Placed This Encounter  Procedures   EKG 12-Lead   No orders of the defined types were placed in this encounter.   There are no Patient Instructions on file for this visit.   Signed, Jacey Eckerson Martinique, MD  09/09/2021 8:19 AM    [place]

## 2021-09-06 ENCOUNTER — Encounter: Payer: Self-pay | Admitting: Internal Medicine

## 2021-09-06 ENCOUNTER — Ambulatory Visit (INDEPENDENT_AMBULATORY_CARE_PROVIDER_SITE_OTHER): Payer: Medicare Other | Admitting: Internal Medicine

## 2021-09-06 VITALS — BP 136/78 | HR 78 | Temp 97.9°F | Resp 16 | Ht 73.0 in | Wt 294.4 lb

## 2021-09-06 DIAGNOSIS — N182 Chronic kidney disease, stage 2 (mild): Secondary | ICD-10-CM

## 2021-09-06 DIAGNOSIS — Z79899 Other long term (current) drug therapy: Secondary | ICD-10-CM

## 2021-09-06 DIAGNOSIS — E785 Hyperlipidemia, unspecified: Secondary | ICD-10-CM | POA: Diagnosis not present

## 2021-09-06 DIAGNOSIS — E559 Vitamin D deficiency, unspecified: Secondary | ICD-10-CM | POA: Diagnosis not present

## 2021-09-06 DIAGNOSIS — I10 Essential (primary) hypertension: Secondary | ICD-10-CM | POA: Diagnosis not present

## 2021-09-06 DIAGNOSIS — G4733 Obstructive sleep apnea (adult) (pediatric): Secondary | ICD-10-CM

## 2021-09-06 DIAGNOSIS — M1000 Idiopathic gout, unspecified site: Secondary | ICD-10-CM | POA: Diagnosis not present

## 2021-09-06 DIAGNOSIS — E1122 Type 2 diabetes mellitus with diabetic chronic kidney disease: Secondary | ICD-10-CM

## 2021-09-06 DIAGNOSIS — E1169 Type 2 diabetes mellitus with other specified complication: Secondary | ICD-10-CM | POA: Diagnosis not present

## 2021-09-06 NOTE — Patient Instructions (Signed)

## 2021-09-06 NOTE — Progress Notes (Signed)
Future Appointments  Date Time Provider Department  06/16/2020                          - 6 mo ov 10:30 AM Unk Pinto, MD [facility]  06/23/2020 10:00 AM Dohmeier, Asencion Partridge, MD [facility]  09/09/2020  9:15 AM [name], [name], DPM [facility]  11/29/2020                      - CPE  10:00 AM Wallace Going  03/04/2021                        - Wellness 11:00 AM Wallace Going    History of Present Illness:       This very nice 66 y.o. MWM presents for 6  month follow up with HTN, HLD, T2_NIDDM  and Vitamin D Deficiency. Patient has OSA on CPAP with improved Restorative sleep. Patient has Gout controlled on his meds.       Patient is treated for HTN  (1973) & BP has been controlled at home. Today's BP is at goal - 136/78 . Patient has had no complaints of any cardiac type chest pain, palpitations, dyspnea Vertell Limber /PND, dizziness, claudication, or dependent edema.       Hyperlipidemia is controlled with diet & Atorvastatin. Patient denies myalgias or other med SE's. Last Lipids were mat goal except elevated Trig's:  Lab Results  Component Value Date   CHOL 94 03/04/2020   HDL 36 (L) 03/04/2020   LDLCALC 33 03/04/2020   TRIG 170 (H) 03/04/2020   CHOLHDL 2.6 03/04/2020     Also, the patient has  Morbid Obesity (BMI 42+) and consequent T2_NIDDM (2004) w/CKD2 (GFR 81)  and on Metformin & Trulicity, patient has had no symptoms of reactive hypoglycemia, diabetic polys, paresthesias or visual blurring.  Last A1c was not at goal:  Lab Results  Component Value Date   HGBA1C 7.2 (H) 03/04/2020            Further, the patient also has history of Vitamin D Deficiency and supplements vitamin D without any suspected side-effects. Last vitamin D was at goal:  Lab Results  Component Value Date   VD25OH 98 11/27/2019     Current Outpatient Medications on File Prior to Visit  Medication Sig   acetaminophen (TYLENOL) 500 MG tablet Take 500 mg by mouth every 6 (six) hours as needed for  moderate pain.   acyclovir (ZOVIRAX) 400 MG tablet TAKE 1 TABLET BY MOUTH  DAILY AS NEEDED FOR COLD  SORES   allopurinol (ZYLOPRIM) 300 MG tablet TAKE 1 TABLET BY MOUTH  DAILY FOR GOUT PREVENTION   aspirin 81 MG tablet Take  daily.   atorvastatin 20 MG tablet Take 1 tablet  daily.   SINEMET IR 25-250 MG  TAKE 1 TABLET  TWICE DAILY FOR RESTLESS  LEGS   MUCINEX DM 30-600 MG Take 1 tablet 2  times daily as needed for cough.   Dulaglutide  3 MG/0.5ML S Inject 3 mg as directed once a week.   DULoxetine  60 MG  TAKE 1 CAPSULE  AT  BEDTIME   enalapril  20 MG tablet TAKE 1 TABLET   TWICE DAILY   gabapentin  600 MG tablet Take  1/2 to 1 tablet  2 to 3 x /day as needed for Pain   Magnesium 500 MG CAPS Take 1,500 mg  every evening.    meloxicam  7.5 MG tablet Take 1 tablet Daily  with Food  is needed for Pain   metFORMIN-XR) 500 MG  Take  2 tablets  2 x /day  with Meals   methocarbamol  500 MG tablet Take 1 tablet  daily as needed for muscle spasms.   Multiple Vitamin  Take 1 tablet  daily.   pantoprazole  40 MG tablet Take  1 tablet  Daily  To Prevent  Heartburn & Indigestion   SENOKOT-S  Take 1 tablet  daily.    tamsulosin  0.4 MG  Take  1 capsule  at Bedtime  for Prostate   Vitamin D  50,000 u Take 2  times a week. Wednesday and Sunday)    Allergies  Allergen Reactions   Fenofibrate Itching and Rash   Flaxseed Oil Rash   Flaxseed [Linseed Oil] Rash   Lyrica [Pregabalin] Itching and Rash   Niaspan [Niacin Er] Rash    PMHx:   Past Medical History:  Diagnosis Date   Acute meniscal tear of left knee    Allergic rhinitis    Arthritis     back   Borderline glaucoma    Chronic back pain    stenosis   CKD (chronic kidney disease), stage II    Complication of anesthesia cervical fusion surgery 06/2010   "woke up next day w/ventilator on" told due to OSA   GERD (gastroesophageal reflux disease)    takes Prevacid daily   Gout    takes Allopurinol daily and Colchicine if needed   History  of adenomatous polyp of colon    tubular adenoma's   History of Barrett's esophagus    History of hiatal hernia    Hyperlipidemia    Hypertension    Incomplete right bundle branch block (RBBB)    OA (osteoarthritis)    knee   OSA on CPAP    Peripheral neuropathy    Prostate cancer [facility]) urologist-  dr Alinda Money   dx 2013  s/p  radical non-nerve sparing prostatectomy 2014--  Stage pT2cNx,  PSA 3.48,  Gleason 3+3,  vol 55cc/  current PSA  0.01 (Aug 2016)   Rupture of Achilles tendon    Thyroid nodule    left side   Type II diabetes mellitus ([place])    Urinary frequency    takes Flomax daily   Urine incontinence    Wears glasses      Immunization History  Administered Date(s) Administered   Influenza Inj Mdck Quad With Preservative 11/22/2016, 12/04/2017   Influenza Split 10/17/2013, 10/29/2014   Influenza, High Dose Seasonal PF 11/26/2018, 11/27/2019   Influenza, Seasonal, Injecte, Preservative Fre 11/29/2015   Influenza-Unspecified 11/04/2012   PFIZER(Purple Top)SARS-COV-2 Vaccination 03/10/2019, 03/31/2019, 11/06/2019   PPD Test 01/16/2006, 04/07/2013   Pneumococcal Conjugate-13 07/29/2018   Pneumococcal Polysaccharide-23 04/07/2013, 11/22/2016   Pneumococcal-Unspecified 09/17/2010   Td 01/17/2011   Zoster, Live 01/16/2005     Past Surgical History:  Procedure Laterality Date   ACHILLES TENDON SURGERY Left 06/27/2017   Procedure: ACHILLES TENDON REPAIR;  Surgeon: Evelina Bucy, DPM;  [facility];  Service: Podiatry;  Laterality: Left;   ANAL FISSURE REPAIR  06-05-2002   BACK SURGERY     CARDIAC CATHETERIZATION     CARDIOVASCULAR STRESS TEST  01-01-2008   normal nuclear study/  no ischemia/  normal LV function and wall motion , 57%   CARPAL TUNNEL RELEASE Left 03/27/2018   CARPAL TUNNEL RELEASE Right 06/2018  ESOPHAGOGASTRODUODENOSCOPY  09/26/2011   Procedure: ESOPHAGOGASTRODUODENOSCOPY (EGD);  Surgeon: Cleotis Nipper, MD;  Location: [facility];  Service:  Endoscopy;  Laterality: N/A;   GASTROC RECESSION EXTREMITY Left 06/27/2017   Procedure: GASTROC RECESSION EXTREMITY;  Surgeon: Evelina Bucy, DPM;  [facility];  Service: Podiatry;  Laterality: Left;   KNEE ARTHROSCOPY WITH MEDIAL MENISECTOMY Left 12/31/2014   Procedure: LEFT KNEE ARTHROSCOPY, PARTIAL MEDIAL AND LATERAL  MENISECTOMIES WITH [place];  Surgeon: Susa Day, MD;  Location: [place];  Service: Orthopedics;  Laterality: Left;   LEFT AND RIGHT HEART CATHETERIZATION WITH CORONARY ANGIOGRAM N/A 08/25/2011   Procedure: LEFT AND RIGHT HEART CATHETERIZATION WITH CORONARY ANGIOGRAM;  Surgeon: Peter M Martinique, MD;  Location: [facility] C[facility] LAB;  Service: Cardiovascular;  Laterality: N/A;   Normal coronary arteries and LVF, ef 55-60%   POSTERIOR FUSION CERVICAL SPINE  07-05-2010   C1 --  C4   POSTERIOR LUMBAR FUSION  05-18-2014   L4 -- S1   ROBOT ASSISTED LAPAROSCOPIC RADICAL PROSTATECTOMY  02/07/2012   Procedure: ROBOTIC ASSISTED LAPAROSCOPIC RADICAL PROSTATECTOMY LEVEL 1;  Surgeon: Molli Hazard, MD;  [facility];  Service: Urology;  Laterality: N/A;      TENDON TRANSFER Left 06/27/2017   Procedure: TENDON TRANSFER;  Surgeon: Evelina Bucy, DPM;  [facility];  Service: Podiatry;  Laterality: Left;   TOTAL KNEE ARTHROPLASTY Left 08/05/2015   Procedure: LEFT TOTAL KNEE ARTHROPLASTY;  Surgeon: Susa Day, MD;  [facility];  Service: Orthopedics;  Laterality: Left;   TRANSTHORACIC ECHOCARDIOGRAM  08-03-2011   mild LVH, ef 55-60%/  trivial TR and PR   UVULOPALATOPHARYNGOPLASTY (UPPP)/TONSILLECTOMY/SEPTOPLASTY  1990's   and Adenoidectomy   wisdom teeth extracted   1982    FHx:    Reviewed / unchanged  SHx:    Reviewed / unchanged   Systems Review:  Constitutional: Denies fever, chills, wt changes, headaches, insomnia, fatigue, night sweats, change in appetite. Eyes: Denies redness, blurred vision, diplopia, discharge, itchy, watery eyes.   ENT: Denies discharge, congestion, post nasal drip, epistaxis, sore throat, earache, hearing loss, dental pain, tinnitus, vertigo, sinus pain, snoring.  CV: Denies chest pain, palpitations, irregular heartbeat, syncope, dyspnea, diaphoresis, orthopnea, PND, claudication or edema. Respiratory: denies cough, dyspnea, DOE, pleurisy, hoarseness, laryngitis, wheezing.  Gastrointestinal: Denies dysphagia, odynophagia, heartburn, reflux, water brash, abdominal pain or cramps, nausea, vomiting, bloating, diarrhea, constipation, hematemesis, melena, hematochezia  or hemorrhoids. Genitourinary: Denies dysuria, frequency, urgency, nocturia, hesitancy, discharge, hematuria or flank pain. Musculoskeletal: Denies arthralgias, myalgias, stiffness, jt. swelling, pain, limping or strain/sprain.  Skin: Denies pruritus, rash, hives, warts, acne, eczema or change in skin lesion(s). Neuro: No weakness, tremor, incoordination, spasms, paresthesia or pain. Psychiatric: Denies confusion, memory loss or sensory loss. Endo: Denies change in weight, skin or hair change.  Heme/Lymph: No excessive bleeding, bruising or enlarged lymph nodes.  Physical Exam  BP 136/78   Pulse 78   Temp 97.9 F (36.6 C)   Resp 16   Ht '6\' 1"'$  (1.854 m)   Wt 294 lb 6.4 oz (133.5 kg)   SpO2 96%   BMI 38.84 kg/m   Appears  over nourished  and in no distress.  Eyes: PERRLA, EOMs, conjunctiva no swelling or erythema. Sinuses: No frontal/maxillary tenderness ENT/Mouth: EAC's clear, TM's nl w/o erythema, bulging. Nares clear w/o erythema, swelling, exudates. Oropharynx clear without erythema or exudates. Oral hygiene is good. Tongue normal, non obstructing. Hearing intact.  Neck: Supple. Thyroid not palpable. Car 2+/2+ without bruits, nodes  or JVD. Chest: Respirations nl with BS clear & equal w/o rales, rhonchi, wheezing or stridor.  Cor: Heart sounds normal w/ regular rate and rhythm without sig. murmurs, gallops, clicks or rubs.  Peripheral pulses normal and equal  without edema.  Abdomen: Soft & bowel sounds normal. Non-tender w/o guarding, rebound, hernias, masses or organomegaly.  Lymphatics: Unremarkable.  Musculoskeletal: Full ROM all peripheral extremities, joint stability, 5/5 strength and normal gait. Bilat hammertoe deformities and Onychomycotic toenails.  Skin: Warm, dry without exposed rashes, lesions or ecchymosis apparent.  Neuro: Cranial nerves intact, reflexes equal bilaterally. Sensory decreased in distal feet/toes to monofilament. Tendon reflexes grossly intact.  Pysch: Alert & oriented x 3.  Insight and judgement nl & appropriate. No ideations.  Assessment and Plan:   1. Essential hypertension  - CBC with Differential/Platelet - COMPLETE METABOLIC PANEL WITH GFR - Magnesium - TSH  2. Hyperlipidemia associated with type 2 diabetes mellitus (HCC)  - Lipid panel - TSH  3. Type 2 diabetes mellitus with stage 2 chronic kidney disease, ([place])  - Hemoglobin A1c - Insulin, random  4. Vitamin D deficiency  - VITAMIN D 25 Hydroxy   5. OSA on CPAP   6. Idiopathic gout  - Uric acid  7. Medication management  - CBC with Differential/Platelet - COMPLETE METABOLIC PANEL WITH GFR - Magnesium - Lipid panel - TSH - Hemoglobin A1c - Insulin, random - VITAMIN D 25 Hydroxy  - Uric acid       Discussed  regular exercise, BP monitoring, weight control to achieve/maintain BMI less than 25 and discussed med and SE's. Recommended labs to assess and monitor clinical status with further disposition pending results of labs.  I discussed the assessment and treatment plan with the patient. The patient was provided an opportunity to ask questions and all were answered. The patient agreed with the plan and demonstrated an understanding of the instructions.  I provided over 30 minutes of exam, counseling, chart review and  complex critical decision making.       The patient was advised to call back or  seek an in-person evaluation if the symptoms worsen or if the condition fails to improve as anticipated.   Kirtland Bouchard, MD

## 2021-09-07 ENCOUNTER — Other Ambulatory Visit: Payer: Self-pay | Admitting: Nurse Practitioner

## 2021-09-07 DIAGNOSIS — Z79899 Other long term (current) drug therapy: Secondary | ICD-10-CM

## 2021-09-07 DIAGNOSIS — E559 Vitamin D deficiency, unspecified: Secondary | ICD-10-CM

## 2021-09-07 LAB — COMPLETE METABOLIC PANEL WITH GFR
AG Ratio: 1.7 (calc) (ref 1.0–2.5)
ALT: 13 U/L (ref 9–46)
AST: 11 U/L (ref 10–35)
Albumin: 4.3 g/dL (ref 3.6–5.1)
Alkaline phosphatase (APISO): 60 U/L (ref 35–144)
BUN: 16 mg/dL (ref 7–25)
CO2: 25 mmol/L (ref 20–32)
Calcium: 10.3 mg/dL (ref 8.6–10.3)
Chloride: 105 mmol/L (ref 98–110)
Creat: 0.9 mg/dL (ref 0.70–1.35)
Globulin: 2.5 g/dL (calc) (ref 1.9–3.7)
Glucose, Bld: 112 mg/dL — ABNORMAL HIGH (ref 65–99)
Potassium: 4.8 mmol/L (ref 3.5–5.3)
Sodium: 142 mmol/L (ref 135–146)
Total Bilirubin: 0.6 mg/dL (ref 0.2–1.2)
Total Protein: 6.8 g/dL (ref 6.1–8.1)
eGFR: 93 mL/min/{1.73_m2} (ref >=60)

## 2021-09-07 LAB — CBC WITH DIFFERENTIAL/PLATELET
Absolute Monocytes: 587 cells/uL (ref 200–950)
Basophils Absolute: 53 cells/uL (ref 0–200)
Basophils Relative: 0.6 %
Eosinophils Absolute: 53 cells/uL (ref 15–500)
Eosinophils Relative: 0.6 %
HCT: 39.6 % (ref 38.5–50.0)
Hemoglobin: 13.4 g/dL (ref 13.2–17.1)
Lymphs Abs: 1486 cells/uL (ref 850–3900)
MCH: 30.6 pg (ref 27.0–33.0)
MCHC: 33.8 g/dL (ref 32.0–36.0)
MCV: 90.4 fL (ref 80.0–100.0)
MPV: 9.9 fL (ref 7.5–12.5)
Monocytes Relative: 6.6 %
Neutro Abs: 6720 cells/uL (ref 1500–7800)
Neutrophils Relative %: 75.5 %
Platelets: 282 10*3/uL (ref 140–400)
RBC: 4.38 10*6/uL (ref 4.20–5.80)
RDW: 15.2 % — ABNORMAL HIGH (ref 11.0–15.0)
Total Lymphocyte: 16.7 %
WBC: 8.9 10*3/uL (ref 3.8–10.8)

## 2021-09-07 LAB — INSULIN, RANDOM: Insulin: 8.7 u[IU]/mL

## 2021-09-07 LAB — LIPID PANEL
Cholesterol: 102 mg/dL (ref <200)
HDL: 36 mg/dL — ABNORMAL LOW (ref >=40)
LDL Cholesterol (Calc): 36 mg/dL (calc)
Non-HDL Cholesterol (Calc): 66 mg/dL (calc) (ref <130)
Total CHOL/HDL Ratio: 2.8 (calc) (ref <5.0)
Triglycerides: 242 mg/dL — ABNORMAL HIGH (ref <150)

## 2021-09-07 LAB — HEMOGLOBIN A1C
Hgb A1c MFr Bld: 6.8 % of total Hgb — ABNORMAL HIGH (ref <5.7)
Mean Plasma Glucose: 148 mg/dL
eAG (mmol/L): 8.2 mmol/L

## 2021-09-07 LAB — VITAMIN D 25 HYDROXY (VIT D DEFICIENCY, FRACTURES): Vit D, 25-Hydroxy: 96 ng/mL (ref 30–100)

## 2021-09-07 LAB — MAGNESIUM: Magnesium: 1.6 mg/dL (ref 1.5–2.5)

## 2021-09-07 LAB — URIC ACID: Uric Acid, Serum: 5.2 mg/dL (ref 4.0–8.0)

## 2021-09-07 LAB — TSH: TSH: 2.24 mIU/L (ref 0.40–4.50)

## 2021-09-07 NOTE — Progress Notes (Signed)
<><><><><><><><><><><><><><><><><><><><><><><><><><><><><><><><><> <><><><><><><><><><><><><><><><><><><><><><><><><><><><><><><><><> - Test results slightly outside the reference range are not unusual. If there is anything important, I will review this with you,  otherwise it is considered normal test values.  If you have further questions,  please do not hesitate to contact me at the office or via My Chart.  <><><><><><><><><><><><><><><><><><><><><><><><><><><><><><><><><> <><><><><><><><><><><><><><><><><><><><><><><><><><><><><><><><><>  -  Total Chol = 102  & LDL Chol = 36   -   Both  Excellent   - Very low risk for Heart Attack  / Stroke <><><><><><><><><><><><><><><><><><><><><><><><><><><><><><><><><>  -  But Triglycerides (  242  ) or fats in blood are too high                 (   Ideal or  Goal is less than 150  !  )    - Recommend avoid fried & greasy foods,  sweets / candy,   - Avoid white rice  (brown or wild rice or Quinoa is OK),   - Avoid white potatoes  (sweet potatoes are OK)   - Avoid anything made from white flour  - bagels, doughnuts, rolls, buns, biscuits, white and   wheat breads, pizza crust and traditional  pasta made of white flour & egg white  - (vegetarian pasta or spinach or wheat pasta is OK).    - Multi-grain bread is OK - like multi-grain flat bread or  sandwich thins.   - Avoid alcohol in excess.   - Exercise is also important. <><><><><><><><><><><><><><><><><><><><><><><><><><><><><><><><><> <><><><><><><><><><><><><><><><><><><><><><><><><><><><><><><><><>  -  A1c is much better - down from 78.3% to now 6.8% , But still too high                      (  Ideal or Goal is less than 5.7%  !  )    Being diabetic has a  300% increased risk for heart attack,  stroke, cancer, and alzheimer- type vascular dementia.   It is very important that you work harder with diet by  avoiding all foods that are white except chicken,   fish &  calliflower.  - Avoid white rice  (brown & wild rice is OK),   - Avoid white potatoes  (sweet potatoes in moderation is OK),   White bread or wheat bread or anything made out of   white flour like bagels, donuts, rolls, buns, biscuits, cakes,  - pastries, cookies, pizza crust, and pasta (made from  white flour & egg whites)   - vegetarian pasta or spinach or wheat pasta is OK.  - Multigrain breads like Arnold's, Pepperidge Farm or   multigrain sandwich thins or high fiber breads like   [place]ka bread or "Dave's Killer" breads that are  4 to 5 grams fiber per slice !  are best.    Diet, exercise and weight loss can reverse and cure  diabetes in the early stages.    - Diet, exercise and weight loss is very important in the   control and prevention of complications of diabetes which  affects every system in your body, ie.   -Brain - dementia/stroke,  - eyes - glaucoma/blindness,  - heart - heart attack/heart failure,  - kidneys - dialysis,  - stomach - gastric paralysis,  - intestines - malabsorption,  - nerves - severe painful neuritis,  - circulation - gangrene & loss of a leg(s)  - and finally  . . . . . . . . . . . . . . . . . [name]    -  cancer and Alzheimers. <><><><><><><><><><><><><><><><><><><><><><><><><><><><><><><><><> <><><><><><><><><><><><><><><><><><><><><><><><><><><><><><><><><>  -   Magnesium  -  1.6    -  very  low- goal is betw 2.0 - 2.5,   - So..............[name]  Recommend that you INCREASE                                                                    Magnesium 500 mg tablet to 3 tablets  /day    - also important to eat lots of  leafy green vegetables   - spinach - Kale - collards - greens - okra - asparagus  - broccoli - quinoa - squash - almonds   - black, red, white beans  -  peas - green beans <><><><><><><><><><><><><><><><><><><><><><><><><><><><><><><><><> <><><><><><><><><><><><><><><><><><><><><><><><><><><><><><><><><>  - Vitamin  D = 96 - Excellent  - Please keep dose same   <><><><><><><><><><><><><><><><><><><><><><><><><><><><><><><><><> <><><><><><><><><><><><><><><><><><><><><><><><><><><><><><><><><>  - Uric Acid / Gout test is Normal - So Please keep dose Allopurinol same  <><><><><><><><><><><><><><><><><><><><><><><><><><><><><><><><><> <><><><><><><><><><><><><><><><><><><><><><><><><><><><><><><><><>  - All Else - CBC - Kidneys - Electrolytes - Liver - Magnesium & Thyroid    - all  Normal / OK  <><><><><><><><><><><><><><><><><><><><><><><><><><><><><><><><><> <><><><><><><><><><><><><><><><><><><><><><><><><><><><><><><><><>

## 2021-09-09 ENCOUNTER — Encounter: Payer: Self-pay | Admitting: Cardiology

## 2021-09-09 ENCOUNTER — Encounter: Payer: Self-pay | Admitting: Internal Medicine

## 2021-09-09 ENCOUNTER — Ambulatory Visit: Payer: Medicare Other | Admitting: Cardiology

## 2021-09-09 VITALS — BP 122/64 | HR 73 | Ht 74.0 in | Wt 293.2 lb

## 2021-09-09 DIAGNOSIS — E782 Mixed hyperlipidemia: Secondary | ICD-10-CM | POA: Diagnosis not present

## 2021-09-09 DIAGNOSIS — R072 Precordial pain: Secondary | ICD-10-CM | POA: Diagnosis not present

## 2021-09-09 DIAGNOSIS — R079 Chest pain, unspecified: Secondary | ICD-10-CM

## 2021-09-09 DIAGNOSIS — I10 Essential (primary) hypertension: Secondary | ICD-10-CM | POA: Diagnosis not present

## 2021-09-09 DIAGNOSIS — R0609 Other forms of dyspnea: Secondary | ICD-10-CM | POA: Diagnosis not present

## 2021-09-09 DIAGNOSIS — E119 Type 2 diabetes mellitus without complications: Secondary | ICD-10-CM | POA: Diagnosis not present

## 2021-09-09 MED ORDER — METOPROLOL TARTRATE 100 MG PO TABS: ORAL_TABLET | ORAL | 0 refills | Status: DC

## 2021-09-09 NOTE — Patient Instructions (Addendum)
Medication Instructions:  Continue same medications   Lab Work: None ordered   Testing/Procedures:  Echo  Coronary CT   will be scheduled after approved by insurance  Follow instructions below     Follow-Up: At [facility], you and your health needs are our priority.  As part of our continuing mission to provide you with exceptional heart care, we have created designated Provider Care Teams.  These Care Teams include your primary Cardiologist (physician) and Advanced Practice Providers (APPs -  Physician Assistants and Nurse Practitioners) who all work together to provide you with the care you need, when you need it.  We recommend signing up for the patient portal called "MyChart".  Sign up information is provided on this After Visit Summary.  MyChart is used to connect with patients for Virtual Visits (Telemedicine).  Patients are able to view lab/test results, encounter notes, upcoming appointments, etc.  Non-urgent messages can be sent to your provider as well.   To learn more about what you can do with MyChart, go to [email or web address].    Your next appointment:  After test    The format for your next appointment: Office     Provider:  Dr.Jordan      Your cardiac CT will be scheduled at one of the below locations:   [facility] [address] [place], [place] [address] [phone]  [place] [address] [place], [place] [address] [phone]  If scheduled at [facility], please arrive at the [facility] Entrance (Entrance C2) of [facility] 30 minutes prior to test start time. You can use the FREE valet parking offered at entrance C (encouraged to control the heart rate for the test)  Proceed to the [facility] Radiology Department (first floor) to check-in and test prep.  All radiology patients and guests should use entrance C2 at [facility],  accessed from [facility], even though the hospital's physical address listed is [address]    If scheduled at [facility], please arrive 15 mins early for check-in and test prep.  Please follow these instructions carefully (unless otherwise directed):  Hold all erectile dysfunction medications at least 3 days (72 hrs) prior to test.  On the Night Before the Test: Be sure to Drink plenty of water. Do not consume any caffeinated/decaffeinated beverages or chocolate 12 hours prior to your test. Do not take any antihistamines 12 hours prior to your test.   On the Day of the Test: Drink plenty of water until 1 hour prior to the test. Do not eat any food 4 hours prior to the test. You may take your regular medications prior to the test.  Take metoprolol 100 mg two hours prior to test. HOLD Bisoprolol morning of test         After the Test: Drink plenty of water. After receiving IV contrast, you may experience a mild flushed feeling. This is normal. On occasion, you may experience a mild rash up to 24 hours after the test. This is not dangerous. If this occurs, you can take Benadryl 25 mg and increase your fluid intake. If you experience trouble breathing, this can be serious. If it is severe call 911 IMMEDIATELY. If it is mild, please call our office. If you take any of these medications: Glipizide/Metformin, Avandament, Glucavance, please do not take 48 hours after completing test unless otherwise instructed.  We will call  to schedule your test 2-4 weeks out understanding that some insurance companies will need an authorization prior to the service being performed.   For non-scheduling related questions, please contact the cardiac imaging nurse navigator should you have any questions/concerns: Marchia Bond, Cardiac Imaging Nurse Navigator Gordy Clement, Cardiac Imaging Nurse Navigator [facility] Direct  Office Dial: [phone]   For scheduling needs, including cancellations and rescheduling, please call [place], [phone].    Important Information About Sugar

## 2021-09-12 ENCOUNTER — Encounter: Payer: Self-pay | Admitting: Podiatry

## 2021-09-14 DIAGNOSIS — E611 Iron deficiency: Secondary | ICD-10-CM | POA: Diagnosis not present

## 2021-09-14 DIAGNOSIS — K2270 Barrett's esophagus without dysplasia: Secondary | ICD-10-CM | POA: Diagnosis not present

## 2021-09-15 ENCOUNTER — Other Ambulatory Visit: Payer: Self-pay

## 2021-09-15 DIAGNOSIS — E1169 Type 2 diabetes mellitus with other specified complication: Secondary | ICD-10-CM

## 2021-09-15 MED ORDER — ATORVASTATIN CALCIUM 20 MG PO TABS: 20.0000 mg | ORAL_TABLET | Freq: Every day | ORAL | 3 refills | Status: DC

## 2021-09-15 MED ORDER — ACCU-CHEK SOFTCLIX LANCETS MISC: 3 refills | Status: DC

## 2021-09-21 ENCOUNTER — Ambulatory Visit (HOSPITAL_COMMUNITY): Payer: Medicare Other | Attending: Cardiology

## 2021-09-21 DIAGNOSIS — E119 Type 2 diabetes mellitus without complications: Secondary | ICD-10-CM | POA: Insufficient documentation

## 2021-09-21 DIAGNOSIS — R0609 Other forms of dyspnea: Secondary | ICD-10-CM | POA: Insufficient documentation

## 2021-09-21 DIAGNOSIS — R079 Chest pain, unspecified: Secondary | ICD-10-CM | POA: Diagnosis not present

## 2021-09-21 DIAGNOSIS — I10 Essential (primary) hypertension: Secondary | ICD-10-CM | POA: Diagnosis not present

## 2021-09-21 DIAGNOSIS — E782 Mixed hyperlipidemia: Secondary | ICD-10-CM | POA: Diagnosis not present

## 2021-09-21 LAB — ECHOCARDIOGRAM COMPLETE
Area-P 1/2: 3.5 cm2
S' Lateral: 2.8 cm

## 2021-09-21 MED ORDER — PERFLUTREN LIPID MICROSPHERE
1.0000 mL | INTRAVENOUS | Status: AC | PRN
  Administered 2021-09-21: 1 mL via INTRAVENOUS

## 2021-09-23 ENCOUNTER — Encounter: Payer: Self-pay | Admitting: Nurse Practitioner

## 2021-09-23 ENCOUNTER — Ambulatory Visit (INDEPENDENT_AMBULATORY_CARE_PROVIDER_SITE_OTHER): Payer: Medicare Other | Admitting: Nurse Practitioner

## 2021-09-23 VITALS — BP 130/82 | HR 79 | Temp 97.5°F | Ht 74.0 in | Wt 291.0 lb

## 2021-09-23 DIAGNOSIS — M2042 Other hammer toe(s) (acquired), left foot: Secondary | ICD-10-CM | POA: Diagnosis not present

## 2021-09-23 DIAGNOSIS — Z79899 Other long term (current) drug therapy: Secondary | ICD-10-CM | POA: Diagnosis not present

## 2021-09-23 DIAGNOSIS — E1122 Type 2 diabetes mellitus with diabetic chronic kidney disease: Secondary | ICD-10-CM | POA: Diagnosis not present

## 2021-09-23 DIAGNOSIS — M2041 Other hammer toe(s) (acquired), right foot: Secondary | ICD-10-CM

## 2021-09-23 DIAGNOSIS — N182 Chronic kidney disease, stage 2 (mild): Secondary | ICD-10-CM

## 2021-09-26 NOTE — Progress Notes (Signed)
Patient:   Jason Wyatt   Date:    09/26/2021  MRN:  [id number]  HISTORY OF PRESENT ILLNESS: Jason Wyatt is a 66 y.o. with Type 2 Diabetes. He is a seen for diabetic foot exam for diabetic shoes.  He is alone for today's appointment.     Do you have any pain in your calf muscles when walking?  Yes Do you have any pain in your foot or feet?  Yes  If yes, Neuropathic pain Have you had any problems with your feet since the last visit?  No.   Do you have any sores on your feet?  No.  If yes, which foot?  Neither Have you noticed any blood or discharge on your socks or hose?  No Does patient wear shoes that are closed toe, low heel, adequate toe room (no pointed toe), and breathable material like leather, canvas or suede?  Yes  PAST DIABETIC HISTORY:  History of amputations:  No If yes, location:  N/A Previous foot ulcer:  No If yes, location: N/A  CURRENT MEDICATIONS: Current Outpatient Medications  Medication Sig Dispense Refill   Accu-Chek Softclix Lancets lancets USE WITH METER TO CHECK  BLOOD SUGAR ONCE DAILY 100 each 3   acetaminophen (TYLENOL) 500 MG tablet Take 500 mg by mouth every 6 (six) hours as needed for moderate pain.     acyclovir (ZOVIRAX) 400 MG tablet TAKE 1 TABLET BY MOUTH  DAILY AS NEEDED FOR COLD  SORES 90 tablet 3   allopurinol (ZYLOPRIM) 300 MG tablet TAKE 1 TABLET BY MOUTH  DAILY FOR GOUT PREVENTION 90 tablet 3   aspirin 81 MG tablet Take 81 mg by mouth daily.     atorvastatin (LIPITOR) 20 MG tablet Take 1 tablet (20 mg total) by mouth daily. 90 tablet 3   bisoprolol (ZEBETA) 10 MG tablet TAKE 1 TABLET BY MOUTH IN  THE MORNING FOR BLOOD  PRESSURE 90 tablet 3   Blood Glucose Monitoring Suppl (ONE TOUCH ULTRA 2) w/Device KIT SMARTSIG:Via Meter 1 kit 0   carbidopa-levodopa (SINEMET IR) 25-250 MG tablet TAKE 1 TABLET BY MOUTH  TWICE DAILY FOR RESTLESS  LEGS 180 tablet 3   Cyanocobalamin (B-12) 2500 MCG SUBL Place under the tongue every other day.      dextromethorphan-guaiFENesin (MUCINEX DM) 30-600 MG 12hr tablet Take 1 tablet by mouth 2 (two) times daily as needed for cough.     Dulaglutide (TRULICITY) 3 DJ/4.9FW SOPN INJECT THE CONTENTS OF ONE PEN  SUBCUTANEOUSLY WEEKLY AS  DIRECTED 6 mL 3   DULoxetine (CYMBALTA) 60 MG capsule Take 1 capsule (60 mg total) by mouth at bedtime. 90 capsule 3   ferrous sulfate 325 (65 FE) MG tablet Take 325 mg by mouth daily with breakfast.     fexofenadine (ALLEGRA) 180 MG tablet Take 180 mg by mouth daily.     gabapentin (NEURONTIN) 600 MG tablet TAKE 1/2 TO 1 TABLET BY  MOUTH 2 TO 3 TIMES DAILY AS NEEDED FOR PAIN 270 tablet 3   glucose blood (ONETOUCH ULTRA) test strip USE TO CHECK BLOOD SUGAR  DAILY 100 strip 3   Magnesium 400 MG CAPS Take by mouth.     meloxicam (MOBIC) 7.5 MG tablet Take 1 tablet Daily  with Food  is needed for Pain 20 tablet 2   metFORMIN (GLUCOPHAGE-XR) 500 MG 24 hr tablet TAKE 2 TABLETS BY MOUTH  TWICE DAILY WITH MEALS FOR  DIABETES 360 tablet 3   methocarbamol (ROBAXIN) 500 MG  tablet Take 1 tablet (500 mg total) by mouth daily as needed for muscle spasms. 20 tablet 2   metoprolol tartrate (LOPRESSOR) 100 MG tablet Take 100 mg 2 hours before coronary ct 1 tablet 0   Multiple Vitamin (MULTIVITAMIN) tablet Take 1 tablet by mouth daily.     olmesartan (BENICAR) 40 MG tablet TAKE 1 TABLET BY MOUTH  EVERY NIGHT FOR BLOOD  PRESSURE AND DIABETIC  KIDNEY PROTECTION 90 tablet 3   pantoprazole (PROTONIX) 40 MG tablet TAKE 1 TABLET DAILY TO  PREVENT HEARTBURN &amp;  INDIGESTION 90 tablet 3   PFIZER COVID-19 VAC BIVALENT injection      sennosides-docusate sodium (SENOKOT-S) 8.6-50 MG tablet Take 1 tablet by mouth daily.     tamsulosin (FLOMAX) 0.4 MG CAPS capsule TAKE 2 CAPSULES (0.08 mg) BY MOUTH AT  BEDTIME FOR PROSTATE 180 capsule 3   Vitamin D, Ergocalciferol, (DRISDOL) 1.25 MG (50000 UNIT) CAPS capsule TAKE 1 CAPSULE BY MOUTH 2 DAYS  WEEKLY FOR VITAMIN D DEFICIENCY 26 capsule 3   No current  facility-administered medications for this visit.     OBJECTIVE/FOOT EXAMINATION:  Foot:    both feet - no blood, ulcer, ingrown toenails (gets trimmed at Podiatry), bilateral hammer toes Skin:    normal exam; no erythema, swelling or tendernessnormal turgor Nails:  thickening Sensory: abnormal to light touch, a pin prick, and vibration   Other:  General appearance: no acute distress, alert/oriented x3, and obese The examination was performed with both feet OTA Skin: normal Swelling: none Warmth: no warmth Tenderness: none ROM: normal Gait: normal  Right Pulses: present Left Pulses: present  Bunions:  Negative.  Complains of associated pain.  Severity = moderate, tolerable  RISK CATEGORIZATION: Low Risk Intact protective sensation - pedal pulses present No deformity; no prior foot ulcer(s) No amputation(s)   High Risk History of foot ulcer with(out) prior amputation Loss of protective sensation; absent pedal pulse(s) Peripheral Arterial Disease; foot deformity  DIAGNOSIS: 1. Type 2 diabetes mellitus with stage 2 chronic kidney disease, without long-term current use of insulin (HCC) Continue diabetic foot wear Continue to follow with Podiatry Education: Reviewed 'ABCs' of diabetes management  Discussed goals to be met and/or maintained include A1C (<7) Blood pressure (<130/80) Cholesterol (LDL <70) Continue Eye Exam yearly  Continue Dental Exam Q6 mo Discussed dietary recommendations Discussed Physical Activity recommendations Foot exam UTD; inspect feet daily Check and monitor A1C   2. Medication management All medications discussed and reviewed in full. All questions and concerns regarding medications addressed.     3. Bilateral hammer toes. Continue diabetic foot wear. Continue to follow with [facility] Inspect feet daily.   FOOTWEAR RECOMMENDATIONS: Nail Care and Podiatrist  PLAN: Follow-up:  as needed

## 2021-09-28 ENCOUNTER — Telehealth (HOSPITAL_COMMUNITY): Payer: Self-pay | Admitting: *Deleted

## 2021-09-28 NOTE — Telephone Encounter (Signed)
Reaching out to patient to offer assistance regarding upcoming cardiac imaging study; pt verbalizes understanding of appt date/time, parking situation and where to check in, medications ordered, and verified current allergies; name and call back number provided for further questions should they arise  Lissandro Dilorenzo RN Navigator Cardiac Imaging [facility] [phone] office [phone] cell  Patient to take 100mg metoprolol tartrate two hours prior to his cardiac CT scan. He is aware to arrive at 10:30am. 

## 2021-09-29 ENCOUNTER — Encounter (HOSPITAL_COMMUNITY): Payer: Self-pay

## 2021-09-29 ENCOUNTER — Ambulatory Visit (HOSPITAL_COMMUNITY)
Admission: RE | Admit: 2021-09-29 | Discharge: 2021-09-29 | Disposition: A | Discharge status: Home / Self Care | Payer: Medicare Other | Source: Ambulatory Visit | Attending: Cardiology | Admitting: Cardiology

## 2021-09-29 DIAGNOSIS — R072 Precordial pain: Secondary | ICD-10-CM | POA: Insufficient documentation

## 2021-09-29 DIAGNOSIS — R0609 Other forms of dyspnea: Secondary | ICD-10-CM | POA: Diagnosis not present

## 2021-09-29 DIAGNOSIS — E119 Type 2 diabetes mellitus without complications: Secondary | ICD-10-CM | POA: Diagnosis not present

## 2021-09-29 DIAGNOSIS — E782 Mixed hyperlipidemia: Secondary | ICD-10-CM | POA: Insufficient documentation

## 2021-09-29 DIAGNOSIS — R079 Chest pain, unspecified: Secondary | ICD-10-CM | POA: Diagnosis not present

## 2021-09-29 DIAGNOSIS — I10 Essential (primary) hypertension: Secondary | ICD-10-CM | POA: Diagnosis not present

## 2021-09-29 DIAGNOSIS — I2510 Atherosclerotic heart disease of native coronary artery without angina pectoris: Secondary | ICD-10-CM | POA: Diagnosis not present

## 2021-09-29 MED ORDER — METOPROLOL TARTRATE 5 MG/5ML IV SOLN
INTRAVENOUS | Status: AC
  Filled 2021-09-29: qty 10

## 2021-09-29 MED ORDER — IOHEXOL 350 MG/ML SOLN
120.0000 mL | Freq: Once | INTRAVENOUS | Status: AC | PRN
  Administered 2021-09-29: 120 mL via INTRAVENOUS

## 2021-09-29 MED ORDER — DILTIAZEM HCL 25 MG/5ML IV SOLN
INTRAVENOUS | Status: AC
  Filled 2021-09-29: qty 5

## 2021-09-29 MED ORDER — NITROGLYCERIN 0.4 MG SL SUBL
0.8000 mg | SUBLINGUAL_TABLET | Freq: Once | SUBLINGUAL | Status: AC
  Administered 2021-09-29: 0.8 mg via SUBLINGUAL

## 2021-09-29 MED ORDER — DILTIAZEM HCL 25 MG/5ML IV SOLN: 5.0000 mg | Freq: Once | INTRAVENOUS | Status: DC

## 2021-09-29 MED ORDER — NITROGLYCERIN 0.4 MG SL SUBL
SUBLINGUAL_TABLET | SUBLINGUAL | Status: AC
  Filled 2021-09-29: qty 2

## 2021-09-29 MED ORDER — METOPROLOL TARTRATE 5 MG/5ML IV SOLN
INTRAVENOUS | Status: AC
  Administered 2021-09-29: 5 mg via INTRAVENOUS
  Filled 2021-09-29: qty 10

## 2021-09-29 MED ORDER — METOPROLOL TARTRATE 5 MG/5ML IV SOLN
5.0000 mg | INTRAVENOUS | Status: DC | PRN
  Administered 2021-09-29: 5 mg via INTRAVENOUS

## 2021-10-05 DIAGNOSIS — M4326 Fusion of spine, lumbar region: Secondary | ICD-10-CM | POA: Diagnosis not present

## 2021-10-05 DIAGNOSIS — M7918 Myalgia, other site: Secondary | ICD-10-CM | POA: Diagnosis not present

## 2021-10-05 DIAGNOSIS — M545 Low back pain, unspecified: Secondary | ICD-10-CM | POA: Diagnosis not present

## 2021-10-12 ENCOUNTER — Encounter: Payer: Self-pay | Admitting: Internal Medicine

## 2021-10-13 ENCOUNTER — Other Ambulatory Visit: Payer: Self-pay

## 2021-10-13 DIAGNOSIS — E1122 Type 2 diabetes mellitus with diabetic chronic kidney disease: Secondary | ICD-10-CM

## 2021-10-13 MED ORDER — ACCU-CHEK SOFTCLIX LANCETS MISC: 3 refills | Status: DC

## 2021-10-19 ENCOUNTER — Telehealth: Payer: Self-pay | Admitting: Internal Medicine

## 2021-10-19 NOTE — Chronic Care Management (AMB) (Signed)
  Chronic Care Management   Outreach Note  10/19/2021 Name: Jason Wyatt MRN: [id number] DOB: 1955/10/15  Referred by: Unk Pinto, MD Reason for referral : No chief complaint on file.   An unsuccessful telephone outreach was attempted today. The patient was referred to the pharmacist for assistance with care management and care coordination.   Follow Up Plan:   Tatjana Dellinger Upstream Scheduler

## 2021-10-19 NOTE — Progress Notes (Signed)
  Chronic Care Management   Note  10/19/2021 Name: FINIS HENDRICKSEN MRN: [id number] DOB: October 10, 1955  Dana Allan Pierpoint is a 66 y.o. year old male who is a primary care patient of Unk Pinto, MD. I reached out to [name] by phone today in response to a referral sent by Mr. Kasean Denherder Clarin's PCP, Unk Pinto, MD.   Mr. Console was given information about Chronic Care Management services today including:  CCM service includes personalized support from designated clinical staff supervised by his physician, including individualized plan of care and coordination with other care providers 24/7 contact phone numbers for assistance for urgent and routine care needs. Service will only be billed when office clinical staff spend 20 minutes or more in a month to coordinate care. Only one practitioner may furnish and bill the service in a calendar month. The patient may stop CCM services at any time (effective at the end of the month) by phone call to the office staff.   Patient agreed to services and verbal consent obtained.   Follow up [place]

## 2021-10-21 DIAGNOSIS — M25511 Pain in right shoulder: Secondary | ICD-10-CM | POA: Diagnosis not present

## 2021-10-21 DIAGNOSIS — M19011 Primary osteoarthritis, right shoulder: Secondary | ICD-10-CM | POA: Diagnosis not present

## 2021-11-03 ENCOUNTER — Ambulatory Visit: Payer: Medicare Other | Admitting: Cardiology

## 2021-11-14 ENCOUNTER — Encounter: Payer: Self-pay | Admitting: Internal Medicine

## 2021-11-18 DIAGNOSIS — M19011 Primary osteoarthritis, right shoulder: Secondary | ICD-10-CM | POA: Diagnosis not present

## 2021-11-18 DIAGNOSIS — M7531 Calcific tendinitis of right shoulder: Secondary | ICD-10-CM | POA: Diagnosis not present

## 2021-11-23 DIAGNOSIS — K2970 Gastritis, unspecified, without bleeding: Secondary | ICD-10-CM | POA: Diagnosis not present

## 2021-11-23 DIAGNOSIS — Z86010 Personal history of colonic polyps: Secondary | ICD-10-CM | POA: Diagnosis not present

## 2021-11-23 DIAGNOSIS — D123 Benign neoplasm of transverse colon: Secondary | ICD-10-CM | POA: Diagnosis not present

## 2021-11-23 DIAGNOSIS — Z09 Encounter for follow-up examination after completed treatment for conditions other than malignant neoplasm: Secondary | ICD-10-CM | POA: Diagnosis not present

## 2021-11-23 DIAGNOSIS — K5730 Diverticulosis of large intestine without perforation or abscess without bleeding: Secondary | ICD-10-CM | POA: Diagnosis not present

## 2021-11-23 DIAGNOSIS — D125 Benign neoplasm of sigmoid colon: Secondary | ICD-10-CM | POA: Diagnosis not present

## 2021-11-23 DIAGNOSIS — K319 Disease of stomach and duodenum, unspecified: Secondary | ICD-10-CM | POA: Diagnosis not present

## 2021-11-23 DIAGNOSIS — K2289 Other specified disease of esophagus: Secondary | ICD-10-CM | POA: Diagnosis not present

## 2021-11-23 DIAGNOSIS — D124 Benign neoplasm of descending colon: Secondary | ICD-10-CM | POA: Diagnosis not present

## 2021-11-23 DIAGNOSIS — K219 Gastro-esophageal reflux disease without esophagitis: Secondary | ICD-10-CM | POA: Diagnosis not present

## 2021-11-23 DIAGNOSIS — D122 Benign neoplasm of ascending colon: Secondary | ICD-10-CM | POA: Diagnosis not present

## 2021-11-23 DIAGNOSIS — D120 Benign neoplasm of cecum: Secondary | ICD-10-CM | POA: Diagnosis not present

## 2021-11-23 DIAGNOSIS — K649 Unspecified hemorrhoids: Secondary | ICD-10-CM | POA: Diagnosis not present

## 2021-11-23 DIAGNOSIS — D509 Iron deficiency anemia, unspecified: Secondary | ICD-10-CM | POA: Diagnosis not present

## 2021-11-23 LAB — HM COLONOSCOPY

## 2021-11-24 ENCOUNTER — Ambulatory Visit: Payer: Medicare Other | Admitting: Podiatry

## 2021-11-24 ENCOUNTER — Encounter: Payer: Self-pay | Admitting: Podiatry

## 2021-11-24 DIAGNOSIS — B351 Tinea unguium: Secondary | ICD-10-CM

## 2021-11-24 DIAGNOSIS — D2372 Other benign neoplasm of skin of left lower limb, including hip: Secondary | ICD-10-CM | POA: Diagnosis not present

## 2021-11-24 DIAGNOSIS — D2371 Other benign neoplasm of skin of right lower limb, including hip: Secondary | ICD-10-CM

## 2021-11-24 DIAGNOSIS — M79676 Pain in unspecified toe(s): Secondary | ICD-10-CM | POA: Diagnosis not present

## 2021-11-24 DIAGNOSIS — E1142 Type 2 diabetes mellitus with diabetic polyneuropathy: Secondary | ICD-10-CM

## 2021-11-25 NOTE — Progress Notes (Signed)
He presents today after losing his wife last week with a chief complaint of painful elongated toenails and calluses bilaterally.  He denies any open lesions or wounds.  Objective: Vital signs are stable he is alert and oriented x3.  There is no erythema edema cellulitis drainage or odor.  Pulses are palpable.  Toenails are long thick yellow dystrophic clinical mycotic as painful calluses plantar aspect of bilateral foot none appear to be preulcerative.  Assessment: Pain in limb secondary to onychomycosis and diabetic peripheral neuropathy as well as benign skin lesions.  Plan: Debrided benign skin lesions debrided nails 1 through 5 bilateral.

## 2021-11-28 DIAGNOSIS — D124 Benign neoplasm of descending colon: Secondary | ICD-10-CM | POA: Diagnosis not present

## 2021-11-28 DIAGNOSIS — K219 Gastro-esophageal reflux disease without esophagitis: Secondary | ICD-10-CM | POA: Diagnosis not present

## 2021-11-28 DIAGNOSIS — D125 Benign neoplasm of sigmoid colon: Secondary | ICD-10-CM | POA: Diagnosis not present

## 2021-11-28 DIAGNOSIS — D123 Benign neoplasm of transverse colon: Secondary | ICD-10-CM | POA: Diagnosis not present

## 2021-11-28 DIAGNOSIS — D120 Benign neoplasm of cecum: Secondary | ICD-10-CM | POA: Diagnosis not present

## 2021-11-28 DIAGNOSIS — K319 Disease of stomach and duodenum, unspecified: Secondary | ICD-10-CM | POA: Diagnosis not present

## 2021-11-28 DIAGNOSIS — D122 Benign neoplasm of ascending colon: Secondary | ICD-10-CM | POA: Diagnosis not present

## 2021-11-29 ENCOUNTER — Encounter: Payer: Medicare Other | Admitting: Nurse Practitioner

## 2021-11-29 ENCOUNTER — Telehealth: Payer: Self-pay

## 2021-11-29 NOTE — Telephone Encounter (Signed)
LM-11/29/21-Chart Prep started, Reviewing OV, Consults, Hospital visits, Labs and medication changes.  Chart prep not complete. Will complete task tomorrow.  Total time spent: 37 min.

## 2021-11-30 NOTE — Telephone Encounter (Signed)
LM-11/30/21-Care plan created and complete for upcoming CP initial visit.  Total time spent: 8 min.

## 2021-11-30 NOTE — Telephone Encounter (Signed)
LM-11/30/21-Resumed chart prep. Reviewing medications, labs, and adherence. Chart prep complete.  Total time spent: 17 min.

## 2021-12-01 ENCOUNTER — Inpatient Hospital Stay: Payer: Medicare Other | Attending: Oncology

## 2021-12-01 ENCOUNTER — Encounter: Payer: Self-pay | Admitting: Nurse Practitioner

## 2021-12-01 ENCOUNTER — Inpatient Hospital Stay: Payer: Medicare Other | Admitting: Nurse Practitioner

## 2021-12-01 VITALS — BP 146/90 | HR 100 | Temp 98.2°F | Resp 18 | Ht 74.0 in | Wt 286.0 lb

## 2021-12-01 DIAGNOSIS — E611 Iron deficiency: Secondary | ICD-10-CM | POA: Insufficient documentation

## 2021-12-01 DIAGNOSIS — I129 Hypertensive chronic kidney disease with stage 1 through stage 4 chronic kidney disease, or unspecified chronic kidney disease: Secondary | ICD-10-CM | POA: Diagnosis not present

## 2021-12-01 DIAGNOSIS — N189 Chronic kidney disease, unspecified: Secondary | ICD-10-CM | POA: Insufficient documentation

## 2021-12-01 DIAGNOSIS — D649 Anemia, unspecified: Secondary | ICD-10-CM | POA: Diagnosis not present

## 2021-12-01 DIAGNOSIS — D509 Iron deficiency anemia, unspecified: Secondary | ICD-10-CM | POA: Diagnosis not present

## 2021-12-01 DIAGNOSIS — E1122 Type 2 diabetes mellitus with diabetic chronic kidney disease: Secondary | ICD-10-CM | POA: Insufficient documentation

## 2021-12-01 LAB — CBC WITH DIFFERENTIAL (CANCER CENTER ONLY)
Abs Immature Granulocytes: 0.08 10*3/uL — ABNORMAL HIGH (ref 0.00–0.07)
Basophils Absolute: 0.1 10*3/uL (ref 0.0–0.1)
Basophils Relative: 1 %
Eosinophils Absolute: 0.1 10*3/uL (ref 0.0–0.5)
Eosinophils Relative: 1 %
HCT: 42.2 % (ref 39.0–52.0)
Hemoglobin: 13.9 g/dL (ref 13.0–17.0)
Immature Granulocytes: 1 %
Lymphocytes Relative: 17 %
Lymphs Abs: 1.5 10*3/uL (ref 0.7–4.0)
MCH: 31.9 pg (ref 26.0–34.0)
MCHC: 32.9 g/dL (ref 30.0–36.0)
MCV: 96.8 fL (ref 80.0–100.0)
Monocytes Absolute: 0.6 10*3/uL (ref 0.1–1.0)
Monocytes Relative: 7 %
Neutro Abs: 6.5 10*3/uL (ref 1.7–7.7)
Neutrophils Relative %: 73 %
Platelet Count: 276 10*3/uL (ref 150–400)
RBC: 4.36 MIL/uL (ref 4.22–5.81)
RDW: 15.5 % (ref 11.5–15.5)
WBC Count: 8.9 10*3/uL (ref 4.0–10.5)
nRBC: 0 % (ref 0.0–0.2)

## 2021-12-01 LAB — FERRITIN: Ferritin: 17 ng/mL — ABNORMAL LOW (ref 24–336)

## 2021-12-01 NOTE — Progress Notes (Signed)
  [facility] OFFICE PROGRESS NOTE   Diagnosis: Iron deficiency  INTERVAL HISTORY:   Jason Wyatt returns as scheduled.  He continues oral iron 3 times daily.  No constipation or nausea.  He does note the iron turned his stool black.  He denies bleeding.  He continues to have dyspnea on exertion.  He reports undergoing an upper endoscopy and colonoscopy earlier this month.  No source of blood loss was identified per his report.  Objective:  Vital signs in last 24 hours:  Blood pressure (!) 146/90, pulse 100, temperature 98.2 F (36.8 C), temperature source Oral, resp. rate 18, height '6\' 2"'$  (1.88 m), weight 286 lb (129.7 kg), SpO2 99 %.    HEENT: No thrush or ulcers. Resp: Lungs clear bilaterally. Cardio: Regular rate and rhythm. GI: No hepatosplenomegaly. Vascular: No lower extremity edema.   Lab Results:  Lab Results  Component Value Date   WBC 8.9 12/01/2021   HGB 13.9 12/01/2021   HCT 42.2 12/01/2021   MCV 96.8 12/01/2021   PLT 276 12/01/2021   NEUTROABS 6.5 12/01/2021    Imaging:  No results found.  Medications: I have reviewed the patient's current medications.  Assessment/Plan: Mild anemia Low ferritin 07/06/2021, 03/04/2020 03/16/2020, 07/22/2021 negative stool Hemoccult cards Urine negative for blood 07/21/2021 Diabetes Hypertension CKD  Disposition: Jason Wyatt appears stable.  The hemoglobin remains in normal range.  We will follow-up on the ferritin from today.  He reports a recent EGD and colonoscopy did not show a source of blood loss.  We will defer further evaluation of the GI tract to his gastroenterologist.  He will return for a CBC and follow-up visit in approximately 4 months.  He will contact the office in the interim with any problems.  [name] ANP/GNP-BC   12/01/2021  8:17 AM

## 2021-12-02 NOTE — Telephone Encounter (Signed)
LM-12/02/21-Called pt. To confirm initial CP visit for 11/21 at 9:00AM. Unable to reach pt. [place] X1.  Total time spent: 2 min.

## 2021-12-02 NOTE — Telephone Encounter (Signed)
LM-12/02/21-Pt. Returned call and confirmed CP initial visit for 11/21 at 9:00AM  Total time spent: 8 min.

## 2021-12-05 ENCOUNTER — Telehealth: Payer: Self-pay

## 2021-12-05 NOTE — Telephone Encounter (Signed)
Patient gave verbal understanding had no further questions or concerns. 

## 2021-12-05 NOTE — Telephone Encounter (Signed)
-----   Message from Owens Shark, NP sent at 12/02/2021  4:43 PM EST ----- Please let him know ferritin is higher but remains low.  Continue oral iron and follow-up as scheduled.

## 2021-12-06 ENCOUNTER — Ambulatory Visit (INDEPENDENT_AMBULATORY_CARE_PROVIDER_SITE_OTHER): Payer: Medicare Other

## 2021-12-06 ENCOUNTER — Other Ambulatory Visit: Payer: Self-pay | Admitting: Internal Medicine

## 2021-12-06 ENCOUNTER — Ambulatory Visit: Payer: Medicare Other | Admitting: Pharmacy Technician

## 2021-12-06 VITALS — Temp 98.0°F

## 2021-12-06 DIAGNOSIS — G4709 Other insomnia: Secondary | ICD-10-CM

## 2021-12-06 DIAGNOSIS — E1142 Type 2 diabetes mellitus with diabetic polyneuropathy: Secondary | ICD-10-CM

## 2021-12-06 DIAGNOSIS — E1122 Type 2 diabetes mellitus with diabetic chronic kidney disease: Secondary | ICD-10-CM

## 2021-12-06 DIAGNOSIS — Z23 Encounter for immunization: Secondary | ICD-10-CM | POA: Diagnosis not present

## 2021-12-06 DIAGNOSIS — Z79899 Other long term (current) drug therapy: Secondary | ICD-10-CM

## 2021-12-06 MED ORDER — TRULICITY 4.5 MG/0.5ML ~~LOC~~ SOAJ: SUBCUTANEOUS | 3 refills | Status: DC

## 2021-12-12 NOTE — Telephone Encounter (Signed)
LM-12/12/21-CP visit notes edited and uploaded to pts. documents.   Total time spent: 4 min.

## 2021-12-13 DIAGNOSIS — M7531 Calcific tendinitis of right shoulder: Secondary | ICD-10-CM | POA: Diagnosis not present

## 2021-12-13 DIAGNOSIS — M19012 Primary osteoarthritis, left shoulder: Secondary | ICD-10-CM | POA: Diagnosis not present

## 2021-12-13 NOTE — Progress Notes (Signed)
Initial Pharmacist Visit (CCM)  Skilton,Donell  89 years, Male  DOB: 08-Aug-1955  M: [phone]  __________________________________________________ Chronic Conditions Patient's Chronic Conditions: Hypertension (HTN), Chronic Kidney Disease (CKD), Hyperlipidemia/Dyslipidemia (HLD), Diabetes (DM), Osteoarthritis, Gout, Anemia, Insomnia  Summary for PCP: 1. PCP please order Microalb/SCr to close medicare gap for the year. 2. PAP for Trulicity re-enrollment faxed by Naida Sleight 12/06/21. 3. Patient having difficulty with sleep since wife passed. To discuss at PCP visit. 4. Flu shot given in office 12/06/21.  Doctor and Hospital Visits Were there PCP Visits in last 6 months?: Yes PCP Visits details: 06/02/21-Tonya Cranford, NP- Pt. presented for 3 month f/u. STARTED rOPINIRole HCl 0.25 MG Take 1 tablet (0.25 mg) 1 hour before bedtime. MODIFIED Tamsulosin HCl 0.4 MG TAKE 2 CAPSULES (0.08 mg) BY MOUTH AT  BEDTIME FOR PROSTATE TAKE 1 CAPSULE BY MOUTH AT  BEDTIME FOR PROSTATE  07/06/21-Dana Carlye Grippe, NP-Pt. presented for c/o fatigue. MODIFIED Ergocalciferol 1.25 MG (50000 UNIT) 1 pill 2 days a week for vitamin d deficiency 50,000 Units Oral As directed, Tuesday , Wednesday, Thursday, Sunday STOPPED Magnesium Oxide 1,500 mg Every evening  09/06/21-Dr. McKeown-Pt. presented for f/u. No medication changes noted.  09/23/21-Tonya Cranford, NP- Pt. presented for diabetic foot exam for diabetic shoes. No medication changes noted. Were there Specialist Visits in last 6 months?: Yes Specialist Visits details: 07/14/21-Bissell, Conley Rolls (Orthopedic Surgery)-No information available.  07/14/21-Bissell, Conley Rolls (Orthopedic Surgery)-Pt. presented for f/u. No medication changes noted.  07/21/21-Thomas, Elby Showers, NP (Hematology and Oncology)-Pt. presented for evaluation of decreased iron stores. No medication changes noted.  08/04/21- [name], [name], DPM (Podiatry)-Pt. presented for c/o of painful elongated toenails  and calluses bilaterally. No medication changes noted.  08/31/21-Sherrill, Izola Price, MD (Oncology)-Pt. presented for f/u. STOPPED rOPINIRole HCl 0.25 MG TAKE 1 TABLET BY MOUTH 1 HOUR  BEFORE BEDTIME (Patient Preference)  09/09/21-Jordan, Ander Slade, MD (Cardiology)- STARTED Metoprolol Tartrate 100 MG Take 100 mg 2 hours before coronary ct  11/24/21-[name], [name], DPM (Podiatry)-Pt. presented for c/o of painful elongated toenails. No medication changes noted.  Disease Assessments Subjective Information Visit Completed on: 12/06/2021 Did patient bring medications to appointment?: No What source (s) was used to reconcile medications this visit?: EMR list Subjective: Very sweet gentleman presenting in office with son for CCM initial visit. His wife Stephen Baruch recently passed away, so he is now widowed. He lives alone with their dog. He has 3 children. One son is very involved and lives close. He does not drive due to neuropathy. He is walking today with a cane. He does have trouble standing after sitting for a while. He has been losing weight due to increased appetite and not sleeping well due to grief. He has a good support system with family, friends, neighbors, and church. Lifestyle habits: Diet: None Exercise: None Tobacco: None Alcohol: None Caffeine: Tea Recreational drugs: None What is the patient's sleep pattern?: Trouble falling asleep, Trouble staying asleep How many hours per night does patient typically sleep?: 5  SDOH: [place] Needs Screening Tool ([email or web address]) SDOH questions were documented and reviewed (EMR or [employer]) within the past 12 months or since hospitalization?: No What is your living situation today? (ref #1): I have a steady place to live Think about the place you live. Do you have problems with any of the following? (ref #2): None of the above Within the past 12  months, you worried that your food would run out before you got money to buy more (  ref #3): Never true Within the past 12 months, the food you bought just didn't last and you didn't have money to get more (ref #4): Never true In the past 12 months, has lack of reliable transportation kept you from medical appointments, meetings, work or from getting things needed for daily living? (ref #5): No In the past 12 months, has the electric, gas, oil, or water company threatened to shut off services in your home? (ref #6): No How often does anyone, including family and friends, physically hurt you? (ref #7): Never (1) How often does anyone, including family and friends, insult or talk down to you? (ref #8): Never (1) How often does anyone, including friends and family, threaten you with harm? (ref #9): Never (1) How often does anyone, including family and friends, scream or curse at you? (ref #10): Never (1)  Medication Adherence Does the [facility] have access to medication refill history?: Yes Medication adherence rates for STAR metric medications:  Atorvastatin 18m / 90DS / 77/02/63& 178/58/85Trulicity 371m/ 8402DX 1141/28/78 04/01/21 Metformin 5002m 90DS / 08/12/21 & 11/05/21 Olmesartan 58m43m90DS / 08/12/21 & 11/05/21  Medication adherence rates for non-STAR metric medications: None Factors that may affect medication adherence?: Pill burden Name and location of Current pharmacy: [facility] Current Rx insurance plan: UHC Are meds synced by current pharmacy?: No Are meds delivered by current pharmacy?: Yes - by mail order pharmacy Would patient benefit from direct intervention of clinical lead in dispensing process to optimize clinical outcomes?: Yes Are [facility] pharmacy services available where patient lives?: Yes Is patient disadvantaged to use [facility]?: Yes Does patient experience delays in picking up medications due to transportation concerns (getting to pharmacy)?:  No Medication organization: Pill Box Assessment:: Adherent  Hypertension (HTN) Most Recent BP: 130/82 Most Recent HR: 79 taken on: 09/23/2021 Care Gap: Need BP documented or last BP 140/90 or higher: Addressed Assessed today?: No Drug: Bisoprolol 10mg20mab QD AM Pharmacist Assessment: Appropriate, Effective, Safe, Accessible Drug: Olmesartan 58mg-22mb QHS Pharmacist Assessment: Appropriate, Effective, Safe, Accessible  Hyperlipidemia/Dyslipidemia (HLD) Last Lipid panel on: 09/06/2021 TC (Goal<200): 102 LDL: 36 HDL (Goal>40): 36 TG (Goal<150): 242 ASCVD 10-year risk?is:: N/A due to lab values outside the range Assessed today?: No Drug: Atorvastatin 20mg-176m QD Pharmacist Assessment: Appropriate, Effective, Safe, Accessible  Diabetes (DM) Most recent A1C: 6.8 taken on: 09/06/2021 Previous A1C: 7.3 taken on: 06/02/2021 Most Recent GFR: 93 taken on: 09/06/2021 Type: 2 Most recent microalbumin ratio: 141 tested on: 11/29/2020 Care Gap: Statin therapy needed: Addressed Care Gap: Need A1c documented or last A1c > 9 %: Addressed Care Gap: Need eye exam documented in EMR or by claim: Addressed Care Gap: Need eGFR and uACR for kidney health evaluation: Needs to be addressed Assessed today?: Yes Goal A1C: < 6.5 % Type: 2 Is Patient taking statin medication?: Yes Is patient taking ACEi / ARB?: Yes Blood glucose monitoring: Glucometer How often does patient check Blood Sugars?: Once daily Recent fasting Blood sugar reading: 116 Has patient experienced any hypoglycemic episodes?: No We discussed: How to recognize and treat signs of hypoglycemia., Low carbohydrate eating plan with an emphasis on whole grains, legumes, nuts, fruits, and vegetables and minimal refined and processed foods., Proper diabetic foot care including daily foot exams and wearing closed toe shoes., Healthy eating habits for DM with patient for 15-20 m67-67s, Complications of Type 2 DM and preventative  measures with patient for 5-10 minutes, Avoiding both sugar-sweetened  and artificially (aspartame) sweetened beverages and increase water intake., Increasing exercise (walking, biking, swimming) to a goal of 30 minutes per day, as able based on current activity level and health, Fasting and post-prandial blood glucose goals, Scheduling a yearly eye exam, Scheduling a dental appointment twice yearly Assessment:: Uncontrolled Drug: Dulaglutide 13m-Inject the contents of one pen subQ weekly as directed Pharmacist Assessment: Appropriate, Query Effectiveness Drug: Metformin 5016m2 tab BID w/ meals Pharmacist Assessment: Appropriate, Query Effectiveness Plan/Follow up: PCP order Microalb/SCr at upcoming visit. PAP submitted for Trulicity by AvNaida Sleight114/48/18Diet and Lifestyle modifications discussed.  Gout Most recent uric acid: 5.2 taken on: 09/06/2021 Assessed today?: No Drug: Allopurinol 30064m tab QD Pharmacist Assessment: Appropriate, Effective, Safe, Accessible  Chronic kidney disease (CKD) Most Recent GFR: 93 taken on: 09/06/2021 Previous GFR: 84 taken on: 06/02/2021 Most recent microalbumin ratio: 141 tested on: 11/29/2020 Assessed today?: No  Osteoarthritis Assessed today?: No Drug: Acetaminophen 500m57mtab every 6 hours PRN Pharmacist Assessment: Appropriate, Effective, Safe, Accessible Drug: Meloxicam 7.5mg-41mab QD w/ food Pharmacist Assessment: Appropriate, Effective, Safe, Accessible  Insomnia Assessed today?: Yes Patient has following issues with sleeping: Trouble falling asleep, Trouble staying asleep Do you nap during the day?: Yes How long do you typically nap?: 1 hour Additional Info: Wife recently passed away and he is grieving. We discussed: Ensuring room is cool and dark when trying to sleep, Avoiding caffeine before bedtime, Avoiding large meals close to bedtime, Establishing a nighttime routine including a consistent bedtime Assessment::  Uncontrolled Drug: None Plan/Follow up: Declines treatment at this time  Preventative Health Care Gap: Colorectal cancer screening: Addressed Care Gap: Annual Wellness Visit (AWV): Addressed Additional exercise counseling points. We discussed: aiming to lose 5 to 10% of body weight through lifestyle modifications, targeting at least 150 minutes per week of moderate-intensity aerobic exercise., decreasing sedentary behavior Additional diet counseling points. We discussed: key components of the DASH diet, key components of a low-carb eating plan, aiming to consume at least 8 cups of water day Plan/Follow up: Flu shot given at appt on 12/06/21  CPP Prep: 21min66m OV: 47min 5mDoc: 15min C75mare Plan: 3min  Su16mry Next CCM Follow Up: 6 months Next AWV: 03/22/2022 at 9:30AM Next PCP Visit: 12/16/21 at 10:00AM  Attestation Statement:: CCM Services:  This encounter meets complex CCM services and moderate to high medical decision making.  Prior to outreach and patient consent for Chronic Care Management, I referred this patient for services after reviewing the nominated patient list or from a personal encounter with the patient.  I have personally reviewed this encounter including the documentation in this note and have collaborated with the care management provider regarding care management and care coordination activities to include development and update of the comprehensive care plan I am certifying that I agree with the content of this note and encounter as supervising physician.  Pharmacy Interventions Intervention Details Pharmacist Interventions discussed: Yes Started Therapy: Preventative therapy, Immunizations recommended Monitoring: Overdue labs, Preventative health screenings Accessibility: Financial assistance Education: Lifestyle modifications   Mylie Mccurley BelMarda StalkerClinical Pharmacist Jillisa [email or web address] [phone]

## 2021-12-14 NOTE — Progress Notes (Addendum)
Patient ID: Jason Wyatt, male   DOB: [birth year], 66 y.o.   MRN: [id number]  Complete Physical Exam  Assessment and Plan  Diagnoses and all orders for this visit:  Encounter for CPE Due Yearly Continue to monitor mood, grieving over death of wife but doing well on Cymbalta  Essential hypertension Continue medication Monitor blood pressure at home more closely; call if consistently over 130/80 Continue DASH diet.   Reminder to go to the ER if any CP, SOB, nausea, dizziness, severe HA, changes vision/speech, left arm numbness and tingling and jaw pain. CBC  Atherosclerotic peripheral vascular disease (HCC) Control blood pressure, cholesterol, glucose, increase exercise.   Other Chronic Back pain Continue to follow with Dr. Mitzi Hansen Continue Meloxicam and Robaxin as needed Encouraged continued weight loss  Obesity hypoventilation syndrome/OSA with CPAP Continue CPAP, weight loss encouraged  Type 2 diabetes mellitus with CKD [facility]) Education: Reviewed 'ABCs' of diabetes management (respective goals in parentheses):  A1C (<7), blood pressure (<130/80), and cholesterol (LDL <70) Eye Exam yearly and Dental Exam every 6 months. Dietary recommendations Physical Activity recommendations Persistently above goal with max dose metformin; now on trulicity, tolerating well, titrate up from 1.5 mg to 3 mg weekly;  - check lipid panel, A1C, CBC, CMP  CKD II associated with T2DM (HCC) Increase fluids, avoid NSAIDS, control sugars and blood pressure, will monitor - CMP/GFR  Hyperlipidemia associated with T2DM (HCC) Continue medications; at LDL goal <70 Continue low cholesterol diet and exercise.  Check lipid panel.   DM type 2 with diabetic peripheral neuropathy (HCC) Check feet at home daily, call with changes/concerns Followed closely by podiatry q3mEmphasized need for improved glucose control -   Primary osteoarthritis of left knee Followed by ortho  CKD (chronic kidney disease),  stage II Increase fluids, avoid NSAIDS, monitor sugars, will monitor Routine urine with reflex microscopic Microalbumin/creatinine urine ratio  RLS Continue meds, increase walking, monitor iron/B12  Morbid obesity (BMI 41+) Long discussion about weight loss, diet, and exercise Recommended diet heavy in fruits and veggies and low in animal meats, cheeses, and dairy products, appropriate calorie intake Discussed appropriate weight for height  Discussed restricting portions of starches, increase high fiber, information given Will start trulicity for glucose and weight benefits Follow up at next visit  Medication management CBC, CMP/GFR, magnesium  Idiopathic chronic gout without tophus, unspecified site Continue allopurinol Diet discussed Check uric acid as needed  Hx of prostate cancer/BPH with LUTS S/p robotic excision, followed by Dr. HLouis Meckel Flomax for residual LUTS PSA   Vitamin D Deficiency Check Vitamin D level Continue supplementation to maintain level between 60-100  Decreased iron stores Continue to follow with Dr. SBenay SpiceContinue ferrous sulfate 325 mg 3 tabs daily      Future Appointments  Date Time Provider [place] 01/19/2022  8:30 AM [facility]  03/02/2022  8:45 AM HMilinda Pointer [name], DPM [facility]  03/22/2022  9:30 AM WAlycia Rossetti NP [facility] None  03/31/2022  7:45 AM [facility] PHLEBOTOMIST [facility] None  03/31/2022  8:00 AM SLadell Pier MD [facility] None  12/19/2022 10:00 AM WAlycia Rossetti NP [facility] None    HPI 66y.o. male  presents for CPE and 3 month follow up with hypertension, hyperlipidemia, diabetes and vitamin D deficiency.  He is recently widower, wife died last month 130-Nov-2023 he was with her at home when she passed. 2 sons, no grandchildren. Enjoys his dog. He is semi retired mCompany secretary   He is  on CPAP for OSA/obesity hypoventilation syndrome and endorses 100% compliance and  restorative sleep. Follow with Dr. Brett Fairy  He has imbalance attributed to advanced peripheral neuropathy, no falls this year, does ambulate without cane if needed. Son drives him when needed.  He follows with Dr. [name] for back pain. Follows with podiatry Dr. [name] for diabetic neuropathy.   Pt is having decreased ROM of shoulders and is undergoing PT and being evaluated by  Dr. Posey Pronto. He has tried U/S guided steroid injections.  He uses Tylenol rarely  He has chronic low back pain.  Follows with Dr. Patrice Paradise and was receiving injections has one upcoming.   He is on sinemet BID for RLS. Also takes gabapentin 600 mg TID PRN.   He is taking flomax in the AM per urology for sense of incomplete bladder emptying; hx of prostate cancer and robotic excision in 2014 and followed by Dr. [name] at [facility]. PSAs have remained <0.1.   he has a diagnosis of GERD which is currently well managed by pantoprazole 40 mg  BMI is Body mass index is 36.98 kg/m., he has not been working on diet and exercise due back/joint pain.  Wt Readings from Last 3 Encounters:  12/16/21 288 lb (130.6 kg)  12/01/21 286 lb (129.7 kg)  09/23/21 291 lb (132 kg)   His blood pressure has been controlled at home, today their BP is BP: 130/78 BP Readings from Last 3 Encounters:  12/16/21 130/78  12/01/21 (!) 146/90  09/29/21 115/69   He does workout, walks daily with dog. He denies chest pain, shortness of breath, dizziness.    He is on cholesterol medication, on Atorvastatin 20 mg every other day denies myalgias. His cholesterol is not at goal. The cholesterol last visit was:   Lab Results  Component Value Date   CHOL 102 09/06/2021   HDL 36 (L) 09/06/2021   LDLCALC 36 09/06/2021   TRIG 242 (H) 09/06/2021   CHOLHDL 2.8 09/06/2021    He has been working on diet  for Type 2 diabetes on metformin 2000 mg daily, recently started on trulicity 4.5 mg weekly, and denies foot ulcerations, increased appetite,  nausea, polydipsia, polyuria, visual disturbances, vomiting and weight loss.  Neuropathy follows with podiatry, on Gabapentin 600 mg BID Diabetic eye - scheduled next month with Dr. Einar Gip. Cataracts both eye removed this year. He has been checking fasting glucose, log demonstrates improved from 180-200 to 110-150s, spikes if he snacks late in the evening  Last A1C in the office was:  Lab Results  Component Value Date   HGBA1C 6.8 (H) 09/06/2021   He has CKD II associated with T2DM monitored at this office. On enalapril. Last GFR:  Lab Results  Component Value Date   EGFR 93 09/06/2021    Patient is on Vitamin D supplement, taking 50000 IU every other day    Lab Results  Component Value Date   VD25OH 96 09/06/2021     Patient is on allopurinol for gout and does not report a recent flare.  Lab Results  Component Value Date   LABURIC 5.2 09/06/2021   Last PSA:  Lab Results  Component Value Date   PSA <0.04 11/29/2020   PSA <0.04 11/27/2019   PSA <0.1 11/26/2018    He is taking Ferrous sulfate 355m 1 in am 2 in PM. Lab Results  Component Value Date   IRON 64 07/06/2021   TIBC 384 07/06/2021   FERRITIN 17 (L) 12/01/2021  Current Outpatient Medications  Medication Instructions   Accu-Chek Softclix Lancets lancets USE WITH METER TO CHECK  BLOOD SUGAR ONCE DAILY   acetaminophen (TYLENOL) 500 mg, Oral, Every 6 hours PRN   allopurinol (ZYLOPRIM) 300 MG tablet TAKE 1 TABLET BY MOUTH  DAILY FOR GOUT PREVENTION   aspirin 81 mg, Oral, Daily   atorvastatin (LIPITOR) 20 mg, Oral, Daily   bisoprolol (ZEBETA) 10 MG tablet TAKE 1 TABLET BY MOUTH IN  THE MORNING FOR BLOOD  PRESSURE   Blood Glucose Monitoring Suppl (ONE TOUCH ULTRA 2) w/Device KIT SMARTSIG:Via Meter   carbidopa-levodopa (SINEMET IR) 25-250 MG tablet TAKE 1 TABLET BY MOUTH  TWICE DAILY FOR RESTLESS  LEGS   Cyanocobalamin (B-12) 2500 MCG SUBL Sublingual, Every other day   dextromethorphan-guaiFENesin (MUCINEX  DM) 30-600 MG 12hr tablet 1 tablet, Oral, 2 times daily PRN   DULoxetine (CYMBALTA) 60 mg, Oral, Daily at bedtime   ferrous sulfate 325 mg, Oral, Daily with breakfast   fexofenadine (ALLEGRA) 180 mg, Oral, Daily PRN   gabapentin (NEURONTIN) 600 MG tablet TAKE 1/2 TO 1 TABLET BY  MOUTH 2 TO 3 TIMES DAILY AS NEEDED FOR PAIN   glucose blood (ONETOUCH ULTRA) test strip USE TO CHECK BLOOD SUGAR  DAILY   Magnesium 800 mg, Oral, Nightly   meloxicam (MOBIC) 7.5 MG tablet Take 1 tablet Daily  with Food  is needed for Pain   metFORMIN (GLUCOPHAGE-XR) 500 MG 24 hr tablet TAKE 2 TABLETS BY MOUTH  TWICE DAILY WITH MEALS FOR  DIABETES   Multiple Vitamin (MULTIVITAMIN) tablet 1 tablet, Oral, Daily   olmesartan (BENICAR) 40 MG tablet TAKE 1 TABLET BY MOUTH  EVERY NIGHT FOR BLOOD  PRESSURE AND DIABETIC  KIDNEY PROTECTION   pantoprazole (PROTONIX) 40 MG tablet TAKE 1 TABLET DAILY TO  PREVENT HEARTBURN &amp;  INDIGESTION   sennosides-docusate sodium (SENOKOT-S) 8.6-50 MG tablet 1 tablet, Oral, Daily   tamsulosin (FLOMAX) 0.4 MG CAPS capsule TAKE 2 CAPSULES (0.08 mg) BY MOUTH AT  BEDTIME FOR PROSTATE   TRULICITY 4.5 JO/8.7OM SOPN Inject 1 pen  (4.5 mg) into Skin every 7 days   Vitamin D, Ergocalciferol, (DRISDOL) 1.25 MG (50000 UNIT) CAPS capsule TAKE 1 CAPSULE BY MOUTH 2 DAYS  WEEKLY FOR VITAMIN D DEFICIENCY     Medical History:  Past Medical History:  Diagnosis Date   Acute meniscal tear of left knee    Allergic rhinitis    Arthritis     back   Borderline glaucoma    Chronic back pain    stenosis   CKD (chronic kidney disease), stage II    Complication of anesthesia cervical fusion surgery 06/2010   "woke up next day w/ventilator on" told due to OSA   GERD (gastroesophageal reflux disease)    takes Prevacid daily   Gout    takes Allopurinol daily and Colchicine if needed   History of adenomatous polyp of colon    tubular adenoma's   History of Barrett's esophagus    History of hiatal hernia     Hyperlipidemia    Hypertension    Incomplete right bundle branch block (RBBB)    OA (osteoarthritis)    knee   OSA on CPAP    Peripheral neuropathy    Prostate cancer [facility]) urologist-  dr Alinda Money   dx 2013  s/p  radical non-nerve sparing prostatectomy 2014--  Stage pT2cNx,  PSA 3.48,  Gleason 3+3,  vol 55cc/  current PSA  0.01 (Aug 2016)   Rupture  of Achilles tendon    Thyroid nodule    left side   Type II diabetes mellitus (HCC)    Urinary frequency    takes Flomax daily   Urine incontinence    Wears glasses    Allergies Allergies  Allergen Reactions   Fenofibrate Itching and Rash   Flaxseed Oil Rash   Flaxseed [Flaxseed (Linseed)] Rash   Lyrica [Pregabalin] Itching and Rash   Niaspan [Niacin Er] Rash   Preventative Medicine Immunization History  Administered Date(s) Administered   Influenza Inj Mdck Quad With Preservative 11/22/2016, 12/04/2017   Influenza Split 10/17/2013, 10/29/2014   Influenza, High Dose Seasonal PF 11/26/2018, 11/27/2019, 11/29/2020, 12/06/2021   Influenza, Seasonal, Injecte, Preservative Fre 11/29/2015   Influenza-Unspecified 11/04/2012   PFIZER(Purple Top)SARS-COV-2 Vaccination 03/10/2019, 03/31/2019, 11/06/2019   PPD Test 01/16/2006, 04/07/2013   Pfizer Covid-19 Vaccine Bivalent Booster 31yr & up 02/16/2021   Pneumococcal Conjugate-13 07/29/2018   Pneumococcal Polysaccharide-23 04/07/2013, 11/22/2016   Pneumococcal-Unspecified 09/17/2010   Td 01/17/2011, 03/10/2021   Td (Adult), 2 Lf Tetanus Toxid, Preservative Free 01/17/2011   Zoster Recombinat (Shingrix) 02/16/2021   Zoster, Live 01/16/2005   Colonoscopy 2016 - Dr. BCristina Gong- due 09/2019, per patient met early 2021 and was advised ok to postpone to next year, due 2022 EGD 2013 Echo 02/2018, EF 55-60%  Tetanus: 2013 Influenza: 11/2018, TODAY  Pneumonia: 2015, 2018 Prevnar 13: 07/2018 Shingles: 2007 Covid 19: 3/3, 2021, pfizer  Vision: Dr. MEinar Gip 01/03/2019, report verified and  abstracted ,has scheduled next month  Dental: Dr. BGloriann Loan 10/2019, goes [id number] Care Team: McUnk PintoMD as PCP - General (Internal Medicine) BeLorretta HarpMD as PCP - Cardiology (Cardiology) HyGarrel RidgelDPM as Consulting Physician (Podiatry) McWebb LawsODStewarts Referring Physician (Optometry) JoMartiniquePeter M, MD as Consulting Physician (Cardiology) BuRonald LoboMD as Consulting Physician (Gastroenterology) BeSusa DayMD as Consulting Physician (Orthopedic Surgery) HeArdis HughsMD as Attending Physician (Urology) BeCarleene MainsRPKaiser Fnd Hosp - [facility] Pharmacist (Pharmacist)   SURGICAL HISTORY He  has a past surgical history that includes Posterior fusion cervical spine (07-05-2010); Esophagogastroduodenoscopy (09/26/2011); Robot assisted laparoscopic radical prostatectomy (02/07/2012); wisdom teeth extracted  (1982); left and right heart catheterization with coronary angiogram (N/A, 08/25/2011); Anal fissure repair (06-05-2002); Cardiovascular stress test (01-01-2008); transthoracic echocardiogram (08-03-2011); Posterior lumbar fusion (05-18-2014); Uvulopalatopharyngoplasty (uppp)/tonsillectomy/septoplasty (1990's); Knee arthroscopy with medial menisectomy (Left, 12/31/2014); Cardiac catheterization; Back surgery; Total knee arthroplasty (Left, 08/05/2015); Gastroc recession extremity (Left, 06/27/2017); Achilles tendon surgery (Left, 06/27/2017); Tendon transfer (Left, 06/27/2017); Carpal tunnel release (Left, 03/27/2018); and Carpal tunnel release (Right, 06/2018). FAMILY HISTORY His family history includes Cancer in his father; Dementia in his mother; Heart attack in his maternal grandmother; Hyperlipidemia in his sister; Hypertension in his mother, paternal grandfather, and sister; Osteoarthritis in his mother; Osteoporosis in his mother; Sleep apnea in his son and son; Stroke in his maternal grandmother. SOCIAL HISTORY He  reports that he has never smoked. He has never used  smokeless tobacco. He reports that he does not drink alcohol and does not use drugs.  Depression/mood screen:      09/09/2021    8:11 PM  Depression screen PHQ 2/9  Decreased Interest 0  Down, Depressed, Hopeless 0  PHQ - 2 Score 0      Review of Systems:  Review of Systems  Constitutional:  Negative for chills, diaphoresis, fever, malaise/fatigue and weight loss.  HENT:  Negative for congestion, ear pain, hearing loss, sore throat and tinnitus.   Eyes: Negative.  Negative for blurred vision and double vision.  Respiratory:  Positive for shortness of breath (on exertion). Negative for cough, sputum production and wheezing.   Cardiovascular:  Negative for chest pain, palpitations, orthopnea, claudication, leg swelling and PND.  Gastrointestinal:  Negative for abdominal pain, blood in stool, constipation, diarrhea, heartburn, melena, nausea and vomiting.  Genitourinary:  Positive for urgency.  Musculoskeletal:  Positive for back pain and joint pain (shoulders, knees, hips). Negative for falls and myalgias.  Skin: Negative.  Negative for rash.  Neurological:  Positive for tingling (bil fingers) and sensory change (chronic numbness of bil feet). Negative for dizziness, loss of consciousness, weakness and headaches.  Endo/Heme/Allergies:  Negative for polydipsia.  Psychiatric/Behavioral: Negative.  Negative for depression, memory loss, substance abuse and suicidal ideas. The patient is not nervous/anxious and does not have insomnia.   All other systems reviewed and are negative.   Physical Exam: BP 130/78   Pulse 71   Temp (!) 96.9 F (36.1 C)   Ht _0  (1.88 m)   Wt 288 lb (130.6 kg)   SpO2 95%   BMI 36.98 kg/m  Wt Readings from Last 3 Encounters:  12/16/21 288 lb (130.6 kg)  12/01/21 286 lb (129.7 kg)  09/23/21 291 lb (132 kg)   General Appearance: Pleasant , teary obese male. Wife passed 1 month ago today Eyes: PERRLA, EOMs, conjunctiva no swelling or erythema ENT/Mouth:  Ear canals clear with no erythema, swelling, or discharge.  TMs normal bilaterally, oropharynx clear, moist, with no exudate.   Neck: Supple, thyroid normal, no JVD, no cervical adenopathy.  Respiratory: Respiratory effort normal, breath sounds clear A&P, no wheeze, rhonchi or rales noted.  No retractions, no accessory muscle usage Cardio: RRR with no MRGs. No edema noted Abdomen: Soft, + BS, morbidly obese abdomen limiting exam.  Non tender, no guarding, rebound, hernias, he has mild ventral hernia with bearing down. Musculoskeletal: Full ROM, 5/5 strength, antalgic gait, uses cane Skin: Warm, dry without rashes, lesions, ecchymosis. Calluses to bilateral feet without ulcers or wounds. Neuro: Awake and oriented X 3, Cranial nerves intact. No cerebellar symptoms. Decreased sensation to feet bilaterally and lower legs Psych: normal affect, Insight and Judgment appropriate.  GU: defer to urology, declines today   EKG: Defer to cardiology  Had essentially normal ECHO 03/06/2018- mildly increased left ventricular wall only    Kalub Morillo Mikki Santee, NP 10:10 AM [facility]

## 2021-12-15 DIAGNOSIS — G4709 Other insomnia: Secondary | ICD-10-CM | POA: Diagnosis not present

## 2021-12-15 DIAGNOSIS — E1142 Type 2 diabetes mellitus with diabetic polyneuropathy: Secondary | ICD-10-CM | POA: Diagnosis not present

## 2021-12-16 ENCOUNTER — Encounter: Payer: Self-pay | Admitting: Nurse Practitioner

## 2021-12-16 ENCOUNTER — Ambulatory Visit (INDEPENDENT_AMBULATORY_CARE_PROVIDER_SITE_OTHER): Payer: Medicare Other | Admitting: Nurse Practitioner

## 2021-12-16 VITALS — BP 130/78 | HR 71 | Temp 96.9°F | Ht 74.0 in | Wt 288.0 lb

## 2021-12-16 DIAGNOSIS — E559 Vitamin D deficiency, unspecified: Secondary | ICD-10-CM

## 2021-12-16 DIAGNOSIS — M1712 Unilateral primary osteoarthritis, left knee: Secondary | ICD-10-CM

## 2021-12-16 DIAGNOSIS — E1169 Type 2 diabetes mellitus with other specified complication: Secondary | ICD-10-CM

## 2021-12-16 DIAGNOSIS — E1122 Type 2 diabetes mellitus with diabetic chronic kidney disease: Secondary | ICD-10-CM | POA: Diagnosis not present

## 2021-12-16 DIAGNOSIS — Z0000 Encounter for general adult medical examination without abnormal findings: Secondary | ICD-10-CM | POA: Diagnosis not present

## 2021-12-16 DIAGNOSIS — Z8546 Personal history of malignant neoplasm of prostate: Secondary | ICD-10-CM

## 2021-12-16 DIAGNOSIS — N401 Enlarged prostate with lower urinary tract symptoms: Secondary | ICD-10-CM

## 2021-12-16 DIAGNOSIS — I70209 Unspecified atherosclerosis of native arteries of extremities, unspecified extremity: Secondary | ICD-10-CM

## 2021-12-16 DIAGNOSIS — Z79899 Other long term (current) drug therapy: Secondary | ICD-10-CM

## 2021-12-16 DIAGNOSIS — Z136 Encounter for screening for cardiovascular disorders: Secondary | ICD-10-CM

## 2021-12-16 DIAGNOSIS — N182 Chronic kidney disease, stage 2 (mild): Secondary | ICD-10-CM

## 2021-12-16 DIAGNOSIS — E1142 Type 2 diabetes mellitus with diabetic polyneuropathy: Secondary | ICD-10-CM

## 2021-12-16 DIAGNOSIS — N138 Other obstructive and reflux uropathy: Secondary | ICD-10-CM

## 2021-12-16 DIAGNOSIS — E662 Morbid (severe) obesity with alveolar hypoventilation: Secondary | ICD-10-CM

## 2021-12-16 DIAGNOSIS — Z1329 Encounter for screening for other suspected endocrine disorder: Secondary | ICD-10-CM

## 2021-12-16 DIAGNOSIS — Z0001 Encounter for general adult medical examination with abnormal findings: Secondary | ICD-10-CM

## 2021-12-16 DIAGNOSIS — I10 Essential (primary) hypertension: Secondary | ICD-10-CM

## 2021-12-16 DIAGNOSIS — Z1389 Encounter for screening for other disorder: Secondary | ICD-10-CM

## 2021-12-16 DIAGNOSIS — G2581 Restless legs syndrome: Secondary | ICD-10-CM

## 2021-12-16 DIAGNOSIS — G4733 Obstructive sleep apnea (adult) (pediatric): Secondary | ICD-10-CM

## 2021-12-16 DIAGNOSIS — R790 Abnormal level of blood mineral: Secondary | ICD-10-CM

## 2021-12-16 DIAGNOSIS — M1A00X0 Idiopathic chronic gout, unspecified site, without tophus (tophi): Secondary | ICD-10-CM

## 2021-12-16 NOTE — Patient Instructions (Addendum)

## 2021-12-17 LAB — COMPLETE METABOLIC PANEL WITH GFR
AG Ratio: 1.8 (calc) (ref 1.0–2.5)
ALT: 10 U/L (ref 9–46)
AST: 11 U/L (ref 10–35)
Albumin: 4.2 g/dL (ref 3.6–5.1)
Alkaline phosphatase (APISO): 70 U/L (ref 35–144)
BUN: 17 mg/dL (ref 7–25)
CO2: 26 mmol/L (ref 20–32)
Calcium: 10.1 mg/dL (ref 8.6–10.3)
Chloride: 103 mmol/L (ref 98–110)
Creat: 0.79 mg/dL (ref 0.70–1.35)
Globulin: 2.4 g/dL (calc) (ref 1.9–3.7)
Glucose, Bld: 139 mg/dL — ABNORMAL HIGH (ref 65–99)
Potassium: 4.8 mmol/L (ref 3.5–5.3)
Sodium: 140 mmol/L (ref 135–146)
Total Bilirubin: 0.5 mg/dL (ref 0.2–1.2)
Total Protein: 6.6 g/dL (ref 6.1–8.1)
eGFR: 97 mL/min/{1.73_m2} (ref >=60)

## 2021-12-17 LAB — VITAMIN D 25 HYDROXY (VIT D DEFICIENCY, FRACTURES): Vit D, 25-Hydroxy: 105 ng/mL — ABNORMAL HIGH (ref 30–100)

## 2021-12-17 LAB — CBC WITH DIFFERENTIAL/PLATELET
Absolute Monocytes: 540 cells/uL (ref 200–950)
Basophils Absolute: 57 cells/uL (ref 0–200)
Basophils Relative: 0.8 %
Eosinophils Absolute: 99 cells/uL (ref 15–500)
Eosinophils Relative: 1.4 %
HCT: 39 % (ref 38.5–50.0)
Hemoglobin: 13.5 g/dL (ref 13.2–17.1)
Lymphs Abs: 1306 cells/uL (ref 850–3900)
MCH: 32.5 pg (ref 27.0–33.0)
MCHC: 34.6 g/dL (ref 32.0–36.0)
MCV: 94 fL (ref 80.0–100.0)
MPV: 10.2 fL (ref 7.5–12.5)
Monocytes Relative: 7.6 %
Neutro Abs: 5098 cells/uL (ref 1500–7800)
Neutrophils Relative %: 71.8 %
Platelets: 262 10*3/uL (ref 140–400)
RBC: 4.15 10*6/uL — ABNORMAL LOW (ref 4.20–5.80)
RDW: 13.8 % (ref 11.0–15.0)
Total Lymphocyte: 18.4 %
WBC: 7.1 10*3/uL (ref 3.8–10.8)

## 2021-12-17 LAB — MAGNESIUM: Magnesium: 1.6 mg/dL (ref 1.5–2.5)

## 2021-12-17 LAB — URINALYSIS, ROUTINE W REFLEX MICROSCOPIC
Bacteria, UA: NONE SEEN /HPF
Bilirubin Urine: NEGATIVE
Glucose, UA: NEGATIVE
Hgb urine dipstick: NEGATIVE
Hyaline Cast: NONE SEEN /LPF
Ketones, ur: NEGATIVE
Leukocytes,Ua: NEGATIVE
Nitrite: NEGATIVE
Specific Gravity, Urine: 1.024 (ref 1.001–1.035)
Squamous Epithelial / HPF: NONE SEEN /HPF (ref <=5)
WBC, UA: NONE SEEN /HPF (ref 0–5)
pH: 6.5 (ref 5.0–8.0)

## 2021-12-17 LAB — PSA: PSA: <0.04 ng/mL (ref <=4.00)

## 2021-12-17 LAB — LIPID PANEL
Cholesterol: 109 mg/dL (ref <200)
HDL: 35 mg/dL — ABNORMAL LOW (ref >=40)
LDL Cholesterol (Calc): 47 mg/dL (calc)
Non-HDL Cholesterol (Calc): 74 mg/dL (calc) (ref <130)
Total CHOL/HDL Ratio: 3.1 (calc) (ref <5.0)
Triglycerides: 208 mg/dL — ABNORMAL HIGH (ref <150)

## 2021-12-17 LAB — MICROALBUMIN / CREATININE URINE RATIO
Creatinine, Urine: 131 mg/dL (ref 20–320)
Microalb Creat Ratio: 100 mcg/mg creat — ABNORMAL HIGH (ref <30)
Microalb, Ur: 13.1 mg/dL

## 2021-12-17 LAB — TSH: TSH: 1.55 mIU/L (ref 0.40–4.50)

## 2021-12-17 LAB — MICROSCOPIC MESSAGE

## 2021-12-17 LAB — HEMOGLOBIN A1C
Hgb A1c MFr Bld: 6.6 % of total Hgb — ABNORMAL HIGH (ref <5.7)
Mean Plasma Glucose: 143 mg/dL
eAG (mmol/L): 7.9 mmol/L

## 2021-12-20 ENCOUNTER — Other Ambulatory Visit: Payer: Self-pay

## 2021-12-20 DIAGNOSIS — E1122 Type 2 diabetes mellitus with diabetic chronic kidney disease: Secondary | ICD-10-CM

## 2021-12-20 DIAGNOSIS — I10 Essential (primary) hypertension: Secondary | ICD-10-CM

## 2021-12-20 DIAGNOSIS — G4733 Obstructive sleep apnea (adult) (pediatric): Secondary | ICD-10-CM | POA: Diagnosis not present

## 2021-12-20 MED ORDER — BISOPROLOL FUMARATE 10 MG PO TABS: ORAL_TABLET | ORAL | 3 refills | Status: DC

## 2021-12-28 DIAGNOSIS — M25512 Pain in left shoulder: Secondary | ICD-10-CM | POA: Diagnosis not present

## 2021-12-28 DIAGNOSIS — M25511 Pain in right shoulder: Secondary | ICD-10-CM | POA: Diagnosis not present

## 2021-12-28 DIAGNOSIS — R293 Abnormal posture: Secondary | ICD-10-CM | POA: Diagnosis not present

## 2022-01-02 ENCOUNTER — Other Ambulatory Visit: Payer: Self-pay

## 2022-01-02 DIAGNOSIS — E1122 Type 2 diabetes mellitus with diabetic chronic kidney disease: Secondary | ICD-10-CM

## 2022-01-02 DIAGNOSIS — M25511 Pain in right shoulder: Secondary | ICD-10-CM | POA: Diagnosis not present

## 2022-01-02 DIAGNOSIS — M25512 Pain in left shoulder: Secondary | ICD-10-CM | POA: Diagnosis not present

## 2022-01-02 DIAGNOSIS — I10 Essential (primary) hypertension: Secondary | ICD-10-CM

## 2022-01-02 DIAGNOSIS — R293 Abnormal posture: Secondary | ICD-10-CM | POA: Diagnosis not present

## 2022-01-02 MED ORDER — OLMESARTAN MEDOXOMIL 40 MG PO TABS: ORAL_TABLET | ORAL | 3 refills | Status: DC

## 2022-01-04 DIAGNOSIS — M791 Myalgia, unspecified site: Secondary | ICD-10-CM | POA: Diagnosis not present

## 2022-01-04 DIAGNOSIS — M542 Cervicalgia: Secondary | ICD-10-CM | POA: Diagnosis not present

## 2022-01-04 DIAGNOSIS — M4326 Fusion of spine, lumbar region: Secondary | ICD-10-CM | POA: Diagnosis not present

## 2022-01-05 DIAGNOSIS — R293 Abnormal posture: Secondary | ICD-10-CM | POA: Diagnosis not present

## 2022-01-05 DIAGNOSIS — M25511 Pain in right shoulder: Secondary | ICD-10-CM | POA: Diagnosis not present

## 2022-01-05 DIAGNOSIS — M25512 Pain in left shoulder: Secondary | ICD-10-CM | POA: Diagnosis not present

## 2022-01-10 DIAGNOSIS — R293 Abnormal posture: Secondary | ICD-10-CM | POA: Diagnosis not present

## 2022-01-10 DIAGNOSIS — M25512 Pain in left shoulder: Secondary | ICD-10-CM | POA: Diagnosis not present

## 2022-01-10 DIAGNOSIS — M25511 Pain in right shoulder: Secondary | ICD-10-CM | POA: Diagnosis not present

## 2022-01-12 DIAGNOSIS — R293 Abnormal posture: Secondary | ICD-10-CM | POA: Diagnosis not present

## 2022-01-12 DIAGNOSIS — M25511 Pain in right shoulder: Secondary | ICD-10-CM | POA: Diagnosis not present

## 2022-01-12 DIAGNOSIS — M25512 Pain in left shoulder: Secondary | ICD-10-CM | POA: Diagnosis not present

## 2022-01-17 DIAGNOSIS — M25512 Pain in left shoulder: Secondary | ICD-10-CM | POA: Diagnosis not present

## 2022-01-17 DIAGNOSIS — R293 Abnormal posture: Secondary | ICD-10-CM | POA: Diagnosis not present

## 2022-01-17 DIAGNOSIS — M25511 Pain in right shoulder: Secondary | ICD-10-CM | POA: Diagnosis not present

## 2022-01-19 ENCOUNTER — Ambulatory Visit: Payer: Medicare Other | Admitting: *Deleted

## 2022-01-19 ENCOUNTER — Other Ambulatory Visit: Payer: Self-pay | Admitting: Nurse Practitioner

## 2022-01-19 DIAGNOSIS — E1142 Type 2 diabetes mellitus with diabetic polyneuropathy: Secondary | ICD-10-CM

## 2022-01-19 DIAGNOSIS — G8929 Other chronic pain: Secondary | ICD-10-CM

## 2022-01-19 DIAGNOSIS — M2041 Other hammer toe(s) (acquired), right foot: Secondary | ICD-10-CM

## 2022-01-19 NOTE — Progress Notes (Signed)
Patient presents to the office today for diabetic shoe and insole measuring.  Patient was measured with brannock device to determine size and width for 1 pair of extra depth shoes and foam casted for 3 pair of insoles.   ABN signed.   Documentation of medical necessity will be sent to patient's treating diabetic doctor to verify and sign.   Patient's diabetic provider: Unk Pinto, MD   Shoes and insoles will be ordered at that time and patient will be notified for an appointment for fitting when they arrive.   Patient shoe selection-   1st   Shoe choice:   [id number]  2nd  Shoe choice:   [place] size ordered: 15

## 2022-01-20 DIAGNOSIS — R293 Abnormal posture: Secondary | ICD-10-CM | POA: Diagnosis not present

## 2022-01-20 DIAGNOSIS — M25512 Pain in left shoulder: Secondary | ICD-10-CM | POA: Diagnosis not present

## 2022-01-20 DIAGNOSIS — M25511 Pain in right shoulder: Secondary | ICD-10-CM | POA: Diagnosis not present

## 2022-01-24 DIAGNOSIS — M25512 Pain in left shoulder: Secondary | ICD-10-CM | POA: Diagnosis not present

## 2022-01-24 DIAGNOSIS — R293 Abnormal posture: Secondary | ICD-10-CM | POA: Diagnosis not present

## 2022-01-24 DIAGNOSIS — M25511 Pain in right shoulder: Secondary | ICD-10-CM | POA: Diagnosis not present

## 2022-01-26 DIAGNOSIS — R293 Abnormal posture: Secondary | ICD-10-CM | POA: Diagnosis not present

## 2022-01-26 DIAGNOSIS — M25511 Pain in right shoulder: Secondary | ICD-10-CM | POA: Diagnosis not present

## 2022-01-26 DIAGNOSIS — M25512 Pain in left shoulder: Secondary | ICD-10-CM | POA: Diagnosis not present

## 2022-01-31 DIAGNOSIS — M25512 Pain in left shoulder: Secondary | ICD-10-CM | POA: Diagnosis not present

## 2022-01-31 DIAGNOSIS — M25511 Pain in right shoulder: Secondary | ICD-10-CM | POA: Diagnosis not present

## 2022-01-31 DIAGNOSIS — R293 Abnormal posture: Secondary | ICD-10-CM | POA: Diagnosis not present

## 2022-02-02 DIAGNOSIS — M25512 Pain in left shoulder: Secondary | ICD-10-CM | POA: Diagnosis not present

## 2022-02-02 DIAGNOSIS — R293 Abnormal posture: Secondary | ICD-10-CM | POA: Diagnosis not present

## 2022-02-02 DIAGNOSIS — M25511 Pain in right shoulder: Secondary | ICD-10-CM | POA: Diagnosis not present

## 2022-02-07 ENCOUNTER — Encounter: Payer: Self-pay | Admitting: Internal Medicine

## 2022-02-07 DIAGNOSIS — R293 Abnormal posture: Secondary | ICD-10-CM | POA: Diagnosis not present

## 2022-02-07 DIAGNOSIS — M25512 Pain in left shoulder: Secondary | ICD-10-CM | POA: Diagnosis not present

## 2022-02-07 DIAGNOSIS — M25511 Pain in right shoulder: Secondary | ICD-10-CM | POA: Diagnosis not present

## 2022-02-20 ENCOUNTER — Encounter: Payer: Self-pay | Admitting: Podiatry

## 2022-02-27 NOTE — Progress Notes (Signed)
Future Appointments  Date Time Provider Department  03/02/2022  8:45 AM Tyson Dense T, DPM [id number]  03/22/2022  9:30 AM Alycia Rossetti, NP [facility]  03/31/2022  8:00 AM Ladell Pier, MD [facility]  12/19/2022 10:00 AM Alycia Rossetti, NP [facility]    History of Present Illness:       This very nice 67 y.o. MWM presents for 6  month follow up with HTN, HLD, T2_NIDDM w/ peripheral sensory neuropathy.  and Vitamin D Deficiency. Patient has OSA on CPAP with improved Restorative sleep. Patient has Gout controlled on his meds. Patient also presents for evaluation for possible need for new diabetic shoes.        Patient is treated for HTN  circa 1973 & BP has been at goal .  Today's BP is at goal - 134/84.  Patient has had no complaints of any cardiac type chest pain, palpitations, dyspnea Vertell Limber /PND, dizziness, claudication, or dependent edema.       Hyperlipidemia is controlled with diet & Atorvastatin. Patient denies myalgias or other med SE's. Last Lipids were mat goal except elevated Trig's:  Lab Results  Component Value Date   CHOL 94 03/04/2020   HDL 36 (L) 03/04/2020   LDLCALC 33 03/04/2020   TRIG 170 (H) 03/04/2020   CHOLHDL 2.6 03/04/2020     Also, the patient has  Morbid Obesity (BMI 42+) and consequent T2_NIDDM (2004) w/CKD2 (GFR 81)  and on Metformin & Trulicity.  Patient has  diabetic peripheral sensory neuropathy with  hammertoe deformities and plantarflexed metatarsals  of multiple toes & metatarsalgia.    Last A1c was not at goal:  Lab Results  Component Value Date   HGBA1C 7.2 (H) 03/04/2020            Further, the patient also has history of Vitamin D Deficiency and supplements vitamin D without any suspected side-effects. Last vitamin D was at goal:  Lab Results  Component Value Date   VD25OH 98 11/27/2019     Current Outpatient Medications on  File Prior to Visit  Medication Sig   acetaminophen (TYLENOL) 500 MG tablet Take 500 mg by mouth every 6 (six) hours as needed for moderate pain.   acyclovir (ZOVIRAX) 400 MG tablet TAKE 1 TABLET BY MOUTH  DAILY AS NEEDED FOR COLD  SORES   allopurinol (ZYLOPRIM) 300 MG tablet TAKE 1 TABLET BY MOUTH  DAILY FOR GOUT PREVENTION   aspirin 81 MG tablet Take  daily.   atorvastatin 20 MG tablet Take 1 tablet  daily.   SINEMET IR 25-250 MG  TAKE 1 TABLET  TWICE DAILY FOR RESTLESS  LEGS   MUCINEX DM 30-600 MG Take 1 tablet 2  times daily as needed for cough.   Dulaglutide  3 MG/0.5ML S Inject 3 mg as directed once a week.   DULoxetine  60 MG  TAKE 1 CAPSULE  AT  BEDTIME   enalapril  20 MG tablet TAKE 1 TABLET   TWICE DAILY   gabapentin  600 MG tablet Take  1/2 to 1 tablet  2 to 3 x /day as needed for Pain   Magnesium 500 MG CAPS Take 1,500 mg  every evening.    meloxicam  7.5 MG tablet Take 1 tablet Daily  with Food  is needed for Pain   metFORMIN-XR) 500 MG  Take  2 tablets  2 x /day  with Meals   methocarbamol  500 MG tablet Take 1 tablet  daily as needed for muscle spasms.   Multiple Vitamin  Take 1 tablet  daily.   pantoprazole  40 MG tablet Take  1 tablet  Daily  To Prevent  Heartburn & Indigestion   SENOKOT-S  Take 1 tablet  daily.    tamsulosin  0.4 MG  Take  1 capsule  at Bedtime  for Prostate   Vitamin D  50,000 u Take 2  times a week. Wednesday and Sunday)    Allergies  Allergen Reactions   Fenofibrate Itching and Rash   Flaxseed Oil Rash   Flaxseed [Linseed Oil] Rash   Lyrica [Pregabalin] Itching and Rash   Niaspan [Niacin Er] Rash    PMHx:   Past Medical History:  Diagnosis Date   Acute meniscal tear of left knee    Allergic rhinitis    Arthritis     back   Borderline glaucoma    Chronic back pain    stenosis   CKD (chronic kidney disease), stage II    Complication of anesthesia cervical fusion surgery 06/2010   "woke up next day w/ventilator on" told due to OSA   GERD  (gastroesophageal reflux disease)    takes Prevacid daily   Gout    takes Allopurinol daily and Colchicine if needed   History of adenomatous polyp of colon    tubular adenoma's   History of Barrett's esophagus    History of hiatal hernia    Hyperlipidemia    Hypertension    Incomplete right bundle branch block (RBBB)    OA (osteoarthritis)    knee   OSA on CPAP    Peripheral neuropathy    Prostate cancer [facility] urologist-  dr Alinda Money   dx 2013  s/p  radical non-nerve sparing prostatectomy 2014--  Stage pT2cNx,  PSA 3.48,  Gleason 3+3,  vol 55cc/  current PSA  0.01 (Aug 2016)   Rupture of Achilles tendon    Thyroid nodule    left side   Type II diabetes mellitus ([place])    Urinary frequency    takes Flomax daily   Urine incontinence    Wears glasses      Immunization History  Administered Date(s) Administered   Influenza Inj Mdck Quad With Preservative 11/22/2016, 12/04/2017   Influenza Split 10/17/2013, 10/29/2014   Influenza, High Dose Seasonal PF 11/26/2018, 11/27/2019   Influenza, Seasonal, Injecte, Preservative Fre 11/29/2015   Influenza-Unspecified 11/04/2012   PFIZER(Purple Top)SARS-COV-2 Vaccination 03/10/2019, 03/31/2019, 11/06/2019   PPD Test 01/16/2006, 04/07/2013   Pneumococcal Conjugate-13 07/29/2018   Pneumococcal Polysaccharide-23 04/07/2013, 11/22/2016   Pneumococcal-Unspecified 09/17/2010   Td 01/17/2011   Zoster, Live 01/16/2005     Past Surgical History:  Procedure Laterality Date   ACHILLES TENDON SURGERY Left 06/27/2017   Procedure: ACHILLES TENDON REPAIR;  Surgeon: Evelina Bucy, DPM;  Location: [facility];  Service: Podiatry;  Laterality: Left;   ANAL FISSURE REPAIR  06-05-2002   BACK SURGERY  CARDIAC CATHETERIZATION     CARDIOVASCULAR STRESS TEST  01-01-2008   normal nuclear study/  no ischemia/  normal LV function and wall motion , 57%   CARPAL TUNNEL RELEASE Left 03/27/2018   CARPAL TUNNEL RELEASE Right 06/2018    ESOPHAGOGASTRODUODENOSCOPY  09/26/2011   Procedure: ESOPHAGOGASTRODUODENOSCOPY (EGD);  Surgeon: Cleotis Nipper, MD;  Location: [facility];  Service: Endoscopy;  Laterality: N/A;   GASTROC RECESSION EXTREMITY Left 06/27/2017   Procedure: GASTROC RECESSION EXTREMITY;  Surgeon: Evelina Bucy, DPM;  Location: [facility];  Service: Podiatry;  Laterality: Left;   KNEE ARTHROSCOPY WITH MEDIAL MENISECTOMY Left 12/31/2014   Procedure: LEFT KNEE ARTHROSCOPY, PARTIAL MEDIAL AND LATERAL  MENISECTOMIES WITH [place];  Surgeon: Susa Day, MD;  Location: [facility];  Service: Orthopedics;  Laterality: Left;   LEFT AND RIGHT HEART CATHETERIZATION WITH CORONARY ANGIOGRAM N/A 08/25/2011   Procedure: LEFT AND RIGHT HEART CATHETERIZATION WITH CORONARY ANGIOGRAM;  Surgeon: Peter M Martinique, MD;  Location: [facility] C[facility];  Service: Cardiovascular;  Laterality: N/A;   Normal coronary arteries and LVF, ef 55-60%   POSTERIOR FUSION CERVICAL SPINE  07-05-2010   C1 --  C4   POSTERIOR LUMBAR FUSION  05-18-2014   L4 -- S1   ROBOT ASSISTED LAPAROSCOPIC RADICAL PROSTATECTOMY  02/07/2012   Procedure: ROBOTIC ASSISTED LAPAROSCOPIC RADICAL PROSTATECTOMY LEVEL 1;  Surgeon: Molli Hazard, MD;  Location: [facility];  Service: Urology;  Laterality: N/A;      TENDON TRANSFER Left 06/27/2017   Procedure: TENDON TRANSFER;  Surgeon: Evelina Bucy, DPM;  Location: [facility];  Service: Podiatry;  Laterality: Left;   TOTAL KNEE ARTHROPLASTY Left 08/05/2015   Procedure: LEFT TOTAL KNEE ARTHROPLASTY;  Surgeon: Susa Day, MD;  Location: [facility];  Service: Orthopedics;  Laterality: Left;   TRANSTHORACIC ECHOCARDIOGRAM  08-03-2011   mild LVH, ef 55-60%/  trivial TR and PR   UVULOPALATOPHARYNGOPLASTY (UPPP)/TONSILLECTOMY/SEPTOPLASTY  1990's   and Adenoidectomy   wisdom teeth extracted   1982    FHx:    Reviewed / unchanged  SHx:    Reviewed / unchanged   Systems Review:  Constitutional: Denies fever, chills, wt  changes, headaches, insomnia, fatigue, night sweats, change in appetite. Eyes: Denies redness, blurred vision, diplopia, discharge, itchy, watery eyes.  ENT: Denies discharge, congestion, post nasal drip, epistaxis, sore throat, earache, hearing loss, dental pain, tinnitus, vertigo, sinus pain, snoring.  CV: Denies chest pain, palpitations, irregular heartbeat, syncope, dyspnea, diaphoresis, orthopnea, PND, claudication or edema. Respiratory: denies cough, dyspnea, DOE, pleurisy, hoarseness, laryngitis, wheezing.  Gastrointestinal: Denies dysphagia, odynophagia, heartburn, reflux, water brash, abdominal pain or cramps, nausea, vomiting, bloating, diarrhea, constipation, hematemesis, melena, hematochezia  or hemorrhoids. Genitourinary: Denies dysuria, frequency, urgency, nocturia, hesitancy, discharge, hematuria or flank pain. Musculoskeletal: Denies arthralgias, myalgias, stiffness, jt. swelling, pain, limping or strain/sprain.  Skin: Denies pruritus, rash, hives, warts, acne, eczema or change in skin lesion(s). Neuro: No weakness, tremor, incoordination, spasms, paresthesia or pain. Reports decreased sensation of distal feet/toes.  Psychiatric: Denies confusion, memory loss or sensory loss. Endo: Denies change in weight, skin or hair change.  Heme/Lymph: No excessive bleeding, bruising or enlarged lymph nodes.  Physical Exam  BP (!) 150/80   Pulse 74   Temp 97.9 F (36.6 C)   Resp 16   Ht 6' 2"$  (1.88 m)   Wt 283 lb 9.6 oz (128.6 kg)   SpO2 98%   BMI 36.41 kg/m   Appears  over nourished  and in no distress.  Eyes:  PERRLA, EOMs, conjunctiva no swelling or erythema. Sinuses: No frontal/maxillary tenderness ENT/Mouth: EAC's clear, TM's nl w/o erythema, bulging. Nares clear w/o erythema, swelling, exudates. Oropharynx clear without erythema or exudates. Oral hygiene is good. Tongue normal, non obstructing. Hearing intact.  Neck: Supple. Thyroid not palpable. Car 2+/2+ without bruits,  nodes or JVD. Chest: Respirations nl with BS clear & equal w/o rales, rhonchi, wheezing or stridor.  Cor: Heart sounds normal w/ regular rate and rhythm without sig. murmurs, gallops, clicks or rubs. Peripheral pulses normal and equal  without edema.  Abdomen: Soft & bowel sounds normal. Non-tender w/o guarding, rebound, hernias, masses or organomegaly.  Lymphatics: Unremarkable.  Musculoskeletal: Full ROM all peripheral extremities, joint stability, 5/5 strength and normal gait. Bilat hammertoe deformities and Onychomycotic toenails.  Skin: Warm, dry without exposed rashes, lesions or ecchymosis apparent.  Neuro: Cranial nerves intact, reflexes equal bilaterally. Sensory decreased in distal feet/toes to monofilament. Tendon reflexes grossly intact.  Pysch: Alert & oriented x 3.  Insight and judgement nl & appropriate. No ideations.  Assessment and Plan:  1. Essential hypertension  2. Hyperlipidemia associated with type 2 diabetes mellitus ([place])  3. Type 2 diabetes mellitus with stage 2 chronic kidney  disease, without long-term current use of insulin ([place])  4. DM type 2 with diabetic peripheral neuropathy ([place])  5. Hammer toes of both feet  6. Vitamin D deficiency        Discussed  regular exercise, BP monitoring, weight control to achieve/maintain BMI less than 25 and discussed med and SE's. Recommended labs to assess and monitor clinical status with further disposition pending results of labs.  I discussed the assessment and treatment plan with the patient. The patient was provided an opportunity to ask questions and all were answered. The patient agreed with the plan and demonstrated an understanding of the instructions.  I provided over 30 minutes of exam, counseling, chart review and  complex critical decision making.       The patient was advised to call back or seek an in-person evaluation if the symptoms worsen or if the condition fails to improve as anticipated.   Kirtland Bouchard, MD

## 2022-02-28 ENCOUNTER — Encounter: Payer: Self-pay | Admitting: Internal Medicine

## 2022-02-28 ENCOUNTER — Ambulatory Visit (INDEPENDENT_AMBULATORY_CARE_PROVIDER_SITE_OTHER): Payer: Medicare Other | Admitting: Internal Medicine

## 2022-02-28 VITALS — BP 134/84 | HR 74 | Temp 97.9°F | Resp 16 | Ht 74.0 in | Wt 283.6 lb

## 2022-02-28 DIAGNOSIS — M2042 Other hammer toe(s) (acquired), left foot: Secondary | ICD-10-CM

## 2022-02-28 DIAGNOSIS — M2041 Other hammer toe(s) (acquired), right foot: Secondary | ICD-10-CM

## 2022-02-28 DIAGNOSIS — E559 Vitamin D deficiency, unspecified: Secondary | ICD-10-CM | POA: Diagnosis not present

## 2022-02-28 DIAGNOSIS — E1122 Type 2 diabetes mellitus with diabetic chronic kidney disease: Secondary | ICD-10-CM | POA: Diagnosis not present

## 2022-02-28 DIAGNOSIS — E1169 Type 2 diabetes mellitus with other specified complication: Secondary | ICD-10-CM

## 2022-02-28 DIAGNOSIS — N182 Chronic kidney disease, stage 2 (mild): Secondary | ICD-10-CM

## 2022-02-28 DIAGNOSIS — E785 Hyperlipidemia, unspecified: Secondary | ICD-10-CM | POA: Diagnosis not present

## 2022-02-28 DIAGNOSIS — I10 Essential (primary) hypertension: Secondary | ICD-10-CM | POA: Diagnosis not present

## 2022-02-28 DIAGNOSIS — E1142 Type 2 diabetes mellitus with diabetic polyneuropathy: Secondary | ICD-10-CM

## 2022-02-28 NOTE — Patient Instructions (Signed)
Diabetes Diabetes Mellitus and Exercise Regular exercise is important for your health, especially if you have diabetes mellitus. Exercise is not just about losing weight. It can also help you increase muscle strength and bone density and reduce body fat and stress. This can help your level of endurance and make you more fit and flexible. Why should I exercise if I have diabetes? Exercise has many benefits for people with diabetes. It can: Help lower and control your blood sugar (glucose). Help your body respond better and become more sensitive to the hormone insulin. Reduce how much insulin your body needs. Lower your risk for heart disease by: Lowering how much "bad" cholesterol and triglycerides you have in your body. Increasing how much "good" cholesterol you have in your body. Lowering your blood pressure. Lowering your blood glucose levels. What is my activity plan? Your health care provider or an expert trained in diabetes care (certified diabetes educator) can help you make an activity plan. This plan can help you find the type of exercise that works for you. It may also tell you how often to exercise and for how long. Be sure to: Get at least 150 minutes of medium-intensity or high-intensity exercise each week. This may involve brisk walking, biking, or water aerobics. Do stretching and strengthening exercises at least 2 times a week. This may involve yoga or weight lifting. Spread out your activity over at least 3 days of the week. Get some form of physical activity each day. Do not go more than 2 days in a row without some kind of activity. Avoid being inactive for more than 30 minutes at a time. Take frequent breaks to walk or stretch. Choose activities that you enjoy. Set goals that you know you can accomplish. Start slowly and increase the intensity of your exercise over time. How do I manage my diabetes during exercise?  Monitor your blood glucose Check your blood glucose  before and after you exercise. If your blood glucose is 240 mg/dL (13.3 mmol/L) or higher before you exercise, check your urine for ketones. These are chemicals created by the liver. If you have ketones in your urine, do not exercise until your blood glucose returns to normal. If your blood glucose is 100 mg/dL (5.6 mmol/L) or lower, eat a snack that has 15-20 grams of carbohydrate in it. Check your blood glucose 15 minutes after the snack to make sure that your level is above 100 mg/dL (5.6 mmol/L) before you start to exercise. Your risk for low blood glucose (hypoglycemia) goes up during and after exercise. Know the symptoms of this condition and how to treat it. Follow these instructions at home: Keep a carbohydrate snack on hand for use before, during, and after exercise. This can help prevent or treat hypoglycemia. Avoid injecting insulin into parts of your body that are going to be used during exercise. This may include: Your arms, when you are going to play tennis. Your legs, when you are about to go jogging. Keep track of your exercise habits. This can help you and your health care provider watch and adjust your activity plan. Write down: What you eat before and after you exercise. Blood glucose levels before and after you exercise. The type and amount of exercise you do. Talk to your health care provider before you start a new activity. They may need to: Make sure that the activity is safe for you. Adjust your insulin, other medicines, and food that you eat. Drink water while you exercise. This  can stop you from losing too much water (dehydration). It can also prevent problems caused by having a lot of heat in your body (heat stroke). Where to find more information American Diabetes Association: diabetes.org Association of Diabetes Care & Education Specialists: diabeteseducator.org This information is not intended to replace advice given to you by your health care provider. Make sure you  discuss any questions you have with your health care provider. Document Revised: 06/22/2021 Document Reviewed: 06/22/2021 Elsevier Patient Education  [place].

## 2022-03-02 ENCOUNTER — Ambulatory Visit: Payer: Medicare Other | Admitting: Podiatry

## 2022-03-02 DIAGNOSIS — D2372 Other benign neoplasm of skin of left lower limb, including hip: Secondary | ICD-10-CM | POA: Diagnosis not present

## 2022-03-02 DIAGNOSIS — D2371 Other benign neoplasm of skin of right lower limb, including hip: Secondary | ICD-10-CM

## 2022-03-02 DIAGNOSIS — E1142 Type 2 diabetes mellitus with diabetic polyneuropathy: Secondary | ICD-10-CM | POA: Diagnosis not present

## 2022-03-02 DIAGNOSIS — M79676 Pain in unspecified toe(s): Secondary | ICD-10-CM

## 2022-03-02 DIAGNOSIS — B351 Tinea unguium: Secondary | ICD-10-CM

## 2022-03-02 NOTE — Progress Notes (Signed)
He presents today chief complaint of painful elongated toenails with painful calluses.  Objective: Vital signs stable alert oriented x 3.  Full pulses are palpable.  Toenails are long thick yellow dystrophic onychomycotic multiple benign skin lesions bilateral no open lesions or wounds.  Assessment: Pain in limb secondary to onychomycosis diabetic peripheral neuropathy and benign skin lesions.  Plan: Debridement of toenails 1 through 5 bilateral debridement of benign skin lesions.

## 2022-03-06 ENCOUNTER — Encounter: Payer: Self-pay | Admitting: Internal Medicine

## 2022-03-07 DIAGNOSIS — M19012 Primary osteoarthritis, left shoulder: Secondary | ICD-10-CM | POA: Diagnosis not present

## 2022-03-07 DIAGNOSIS — M7531 Calcific tendinitis of right shoulder: Secondary | ICD-10-CM | POA: Diagnosis not present

## 2022-03-10 ENCOUNTER — Ambulatory Visit: Payer: Medicare Other | Admitting: Nurse Practitioner

## 2022-03-20 DIAGNOSIS — G4733 Obstructive sleep apnea (adult) (pediatric): Secondary | ICD-10-CM | POA: Diagnosis not present

## 2022-03-21 NOTE — Progress Notes (Addendum)
Patient ID: Jason Wyatt, male   DOB: 03/23/1955, 67 y.o.   MRN: [id number]  3 month follow up and Medicare wellness  Assessment and Plan  Diagnoses and all orders for this visit:  Encounter for annual medicare wellness  Due Yearly  Essential hypertension Continue medication Monitor blood pressure at home more closely; call if consistently over 130/80 Continue DASH diet.   Reminder to go to the ER if any CP, SOB, nausea, dizziness, severe HA, changes vision/speech, left arm numbness and tingling and jaw pain.  Obesity hypoventilation syndrome Continue CPAP, weight loss encouraged  Type 2 diabetes mellitus with CKD [facility]) Education: Reviewed 'ABCs' of diabetes management (respective goals in parentheses):  A1C (<7), blood pressure (<130/80), and cholesterol (LDL <70) Eye Exam yearly and Dental Exam every 6 months. Dietary recommendations Physical Activity recommendations Persistently above goal with max dose metformin; now on trulicity, tolerating well, currently on 4.5 mg once a week - check lipid panel - A1c  CKD II associated with T2DM (HCC) Increase fluids, avoid NSAIDS, control sugars and blood pressure, will monitor - CMP/GFR  Hyperlipidemia associated with T2DM (HCC) Continue medications; at LDL goal <70 Continue low cholesterol diet and exercise.  Check lipid panel.   DM type 2 with diabetic peripheral neuropathy (HCC)/ Atherosclerotic PVD(HCC) Check feet at home daily, call with changes/concerns Followed closely by podiatry q86m Emphasized need for improved glucose control -   Primary osteoarthritis of left knee Followed by ortho   RLS Continue meds, increase walking, monitor iron/B12  Type 2 Diabetes Mellitus with Morbid obesity (BMI 41+) Long discussion about weight loss, diet, and exercise Recommended diet heavy in fruits and veggies and low in animal meats, cheeses, and dairy products, appropriate calorie intake Discussed appropriate weight for height   Discussed restricting portions of starches, increase high fiber, information given Will start trulicity for glucose and weight benefits Follow up at next visit  Medication management CBC, CMP/GFR  Idiopathic chronic gout without tophus, unspecified site Continue allopurinol Diet discussed Check uric acid as needed  Hx of prostate cancer S/p robotic excision, followed by Dr. [name], Flomax for residual LUTS  Gait instability R/t neuropathy; denies falls; encouraged cane if needed Can refer to PT if getting worse; declines at this time   Future Appointments  Date Time Provider [place]  03/31/2022  7:45 AM [facility] PHLEBOTOMIST [facility] None  03/31/2022  8:00 AM Ladell Pier, MD [facility] None  05/30/2022  8:45 AM [name], [name], DPM [facility]  07/06/2022  9:30 AM Darrol Jump, NP [facility] None  12/19/2022 10:00 AM Alycia Rossetti, NP [facility] None  03/20/2023 10:00 AM Alycia Rossetti, NP [facility] None     Plan:   During the course of the visit the patient was educated and counseled about appropriate screening and preventive services including:   Pneumococcal vaccine  Prevnar 13 Influenza vaccine Td vaccine Screening electrocardiogram Bone densitometry screening Colorectal cancer screening Diabetes screening Glaucoma screening Nutrition counseling  Advanced directives: requested    HPI BP 132/76   Pulse 75   Temp (!) 97.5 F (36.4 C)   Ht 6\' 1"  (1.854 m)   Wt 284 lb (128.8 kg)   SpO2 97%   BMI 37.47 kg/m   67 y.o. male  presents for AWV and 3 month follow up with hypertension, hyperlipidemia, diabetes and vitamin D deficiency. He has CKD stage 2 due to type 2 diabetes mellitus ([place]); Morbid obesity ([place]); Obesity hypoventilation syndrome ([place]); T2_NIDDM w/ CKD 2 ;  Gout; Hyperlipidemia associated with type 2 diabetes mellitus ([place]); Hypertension; RLS; Medication management; DM type 2 with diabetic peripheral neuropathy ([place]);  Normal coronary arteries; Primary osteoarthritis of left knee; Gait instability; Bilateral carpal tunnel syndrome; Heart rate fast; History of prostate cancer; Iron deficiency anemia; Cubital tunnel syndrome; Obesity with alveolar hypoventilation, unspecified obesity severity (HCC); CPAP (continuous positive airway pressure) dependence; Other insomnia; S/P UPPP (uvulopalatopharyngoplasty); Shortness of breath; OSA on CPAP; Severe obstructive sleep apnea; and Atherosclerotic peripheral vascular disease ([place]) on their problem list.  He is married, 2 sons, no grandchildren. Enjoys his dog. He is semi retired Company secretary. His wife died this past year. His mood is labile, depending on thoughts about wife  He has been having shoulder and arm pain, has good range of motion. He does have very high steps to get into his townhouse and requires a ramp to get into his house.   He is on CPAP for OSA/obesity hypoventilation syndrome and endorses 100% compliance and restorative sleep.   He has imbalance attributed to advanced peripheral neuropathy, no falls this year, does ambulate without cane if needed. Son drives him when needed. He follows with Dr. [name] for back pain. Follows with podiatry Dr. [name] for diabetic neuropathy.  He is on sinemet BID for RLS. Also takes gabapentin 600 mg TID PRN.   He is taking flomax in the AM per urology for sense of incomplete bladder emptying; hx of prostate cancer and robotic excision in 2014 and followed by Dr. [name] at [facility]. PSAs have remained <0.1.   He has a diagnosis of GERD which is currently well managed by pantoprazole 40 mg  BMI is Body mass index is 37.47 kg/m., he has not been working on diet and exercise due back/joint pain. Admits he eats potatoes daily, knows he needs to do better with diet.  Wt Readings from Last 3 Encounters:  03/22/22 284 lb (128.8 kg)  02/28/22 283 lb 9.6 oz (128.6 kg)  12/16/21 288 lb (130.6 kg)   His blood pressure has  been controlled at home, today their BP is BP: 132/76  BP Readings from Last 3 Encounters:  03/22/22 132/76  02/28/22 134/84  12/16/21 130/78   He does workout, walks daily with dog. He denies chest pain, shortness of breath, dizziness.   He is on cholesterol medication, on Atorvastatin 20 mg every other day denies myalgias. His cholesterol is not at goal. The cholesterol last visit was:   Lab Results  Component Value Date   CHOL 109 12/16/2021   HDL 35 (L) 12/16/2021   LDLCALC 47 12/16/2021   TRIG 208 (H) 12/16/2021   CHOLHDL 3.1 12/16/2021    He has been working on diet  for Type 2 diabetes on metformin 2000 mg daily, recently started on trulicity 3 mg weekly, and denies foot ulcerations, increased appetite, nausea, polydipsia, polyuria, visual disturbances, vomiting and weight loss.  Trulicity 4.5 mg SQ QW, Metformin 500 2 tab BID Neuropathy follows with podiatry, on 600 mg BID Diabetic eye -  Dr. Einar Gip 12/2021 He has been checking fasting glucose, log demonstrates improved from 118-150 Last A1C in the office was:  Lab Results  Component Value Date   HGBA1C 6.6 (H) 12/16/2021   He has CKD II associated with T2DM monitored at this office. On enalapril. Last GFR:  Lab Results  Component Value Date   EGFR 97 12/16/2021      Patient is on Vitamin D supplement, taking 50000 IU every other day  Lab Results  Component Value Date   VD25OH 105 (H) 12/16/2021     Patient is on allopurinol for gout and does not report a recent flare.  Lab Results  Component Value Date   LABURIC 5.2 09/06/2021      Current Outpatient Medications  Medication Instructions   Accu-Chek Softclix Lancets lancets USE WITH METER TO CHECK  BLOOD SUGAR ONCE DAILY   acetaminophen (TYLENOL) 500 mg, Oral, Every 6 hours PRN   allopurinol (ZYLOPRIM) 300 MG tablet TAKE 1 TABLET BY MOUTH  DAILY FOR GOUT PREVENTION   aspirin 81 mg, Oral, Daily   atorvastatin (LIPITOR) 20 mg, Oral, Daily   bisoprolol  (ZEBETA) 10 MG tablet TAKE 1 TABLET BY MOUTH IN  THE MORNING FOR BLOOD  PRESSURE   Blood Glucose Monitoring Suppl (ONE TOUCH ULTRA 2) w/Device KIT SMARTSIG:Via Meter   carbidopa-levodopa (SINEMET IR) 25-250 MG tablet TAKE 1 TABLET BY MOUTH  TWICE DAILY FOR RESTLESS  LEGS   Cyanocobalamin (B-12) 2500 MCG SUBL Sublingual, Every other day   dextromethorphan-guaiFENesin (MUCINEX DM) 30-600 MG 12hr tablet 1 tablet, Oral, 2 times daily PRN   DULoxetine (CYMBALTA) 60 mg, Oral, Daily at bedtime   ferrous sulfate 325 mg, Oral, Daily with breakfast   fexofenadine (ALLEGRA) 180 mg, Oral, Daily PRN   gabapentin (NEURONTIN) 600 MG tablet TAKE 1/2 TO 1 TABLET BY  MOUTH 2 TO 3 TIMES DAILY AS NEEDED FOR PAIN   glucose blood (ONETOUCH ULTRA) test strip USE TO CHECK BLOOD SUGAR  DAILY   Magnesium 800 mg, Oral, Nightly   meloxicam (MOBIC) 7.5 MG tablet Take 1 tablet Daily  with Food  is needed for Pain   metFORMIN (GLUCOPHAGE-XR) 500 MG 24 hr tablet TAKE 2 TABLETS BY MOUTH  TWICE DAILY WITH MEALS FOR  DIABETES   methocarbamol (ROBAXIN) 500 mg, Oral, 3 times daily PRN, PRN   Multiple Vitamin (MULTIVITAMIN) tablet 1 tablet, Oral, Daily   olmesartan (BENICAR) 40 MG tablet TAKE 1 TABLET BY MOUTH  EVERY NIGHT FOR BLOOD  PRESSURE AND DIABETIC  KIDNEY PROTECTION   pantoprazole (PROTONIX) 40 MG tablet TAKE 1 TABLET DAILY TO  PREVENT HEARTBURN &amp;  INDIGESTION   sennosides-docusate sodium (SENOKOT-S) 8.6-50 MG tablet 1 tablet, Daily   tamsulosin (FLOMAX) 0.4 MG CAPS capsule TAKE 2 CAPSULES (0.08 mg) BY MOUTH AT  BEDTIME FOR PROSTATE   TRULICITY 4.5 0000000 SOPN Inject 1 pen  (4.5 mg) into Skin every 7 days   Vitamin D, Ergocalciferol, (DRISDOL) 1.25 MG (50000 UNIT) CAPS capsule TAKE 1 CAPSULE BY MOUTH 2 DAYS  WEEKLY FOR VITAMIN D DEFICIENCY     Medical History:  Past Medical History:  Diagnosis Date   Acute meniscal tear of left knee    Allergic rhinitis    Arthritis     back   Borderline glaucoma    Chronic  back pain    stenosis   CKD (chronic kidney disease), stage II    Complication of anesthesia cervical fusion surgery 06/2010   "woke up next day w/ventilator on" told due to OSA   GERD (gastroesophageal reflux disease)    takes Prevacid daily   Gout    takes Allopurinol daily and Colchicine if needed   History of adenomatous polyp of colon    tubular adenoma's   History of Barrett's esophagus    History of hiatal hernia    Hyperlipidemia    Hypertension    Incomplete right bundle branch block (RBBB)    OA (osteoarthritis)  knee   OSA on CPAP    Peripheral neuropathy    Prostate cancer [facility]) urologist-  dr Alinda Money   dx 2013  s/p  radical non-nerve sparing prostatectomy 2014--  Stage pT2cNx,  PSA 3.48,  Gleason 3+3,  vol 55cc/  current PSA  0.01 (Aug 2016)   Rupture of Achilles tendon    Thyroid nodule    left side   Type II diabetes mellitus ([place])    Urinary frequency    takes Flomax daily   Urine incontinence    Wears glasses    Allergies Allergies  Allergen Reactions   Fenofibrate Itching and Rash   Flaxseed Oil Rash   Flaxseed [Flaxseed (Linseed)] Rash   Lyrica [Pregabalin] Itching and Rash   Niaspan [Niacin Er] Rash   Preventative Medicine Immunization History  Administered Date(s) Administered   Influenza Inj Mdck Quad With Preservative 11/22/2016, 12/04/2017   Influenza Split 10/17/2013, 10/29/2014   Influenza, High Dose Seasonal PF 11/26/2018, 11/27/2019, 11/29/2020, 12/06/2021   Influenza, Seasonal, Injecte, Preservative Fre 11/29/2015   Influenza-Unspecified 11/04/2012   PFIZER(Purple Top)SARS-COV-2 Vaccination 03/10/2019, 03/31/2019, 11/06/2019   PPD Test 01/16/2006, 04/07/2013   Pfizer Covid-19 Vaccine Bivalent Booster 73yrs & up 02/16/2021   Pneumococcal Conjugate-13 07/29/2018   Pneumococcal Polysaccharide-23 04/07/2013, 11/22/2016   Pneumococcal-Unspecified 09/17/2010   Td 01/17/2011, 03/10/2021   Td (Adult), 2 Lf Tetanus Toxid, Preservative Free  01/17/2011   Zoster Recombinat (Shingrix) 02/16/2021   Zoster, Live 01/16/2005   Health Maintenance  Topic Date Due   COVID-19 Vaccine (5 - 2023-24 season) 04/07/2022 (Originally 09/16/2021)   Zoster Vaccines- Shingrix (2 of 2) 06/22/2022 (Originally 04/13/2021)   HEMOGLOBIN A1C  06/17/2022   Diabetic kidney evaluation - eGFR measurement  12/17/2022   Diabetic kidney evaluation - Urine ACR  12/17/2022   FOOT EXAM  12/17/2022   OPHTHALMOLOGY EXAM  12/30/2022   Medicare Annual Wellness (AWV)  03/22/2023   Pneumonia Vaccine 43+ Years old (3 of 3 - PPSV23 or PCV20) 07/29/2023   COLONOSCOPY (Pts 45-70yrs Insurance coverage will need to be confirmed)  11/24/2026   DTaP/Tdap/Td (3 - Tdap) 03/11/2031   INFLUENZA VACCINE  Completed   Hepatitis C Screening  Completed   HPV VACCINES  Aged Out     Vision: Dr. Einar Gip, 12/2020,  Dental: Dr. Gloriann Loan, 10/2019, goes q68m,01/2021  Patient Care Team: Unk Pinto, MD as PCP - General (Internal Medicine) Lorretta Harp, MD as PCP - Cardiology (Cardiology) Garrel Ridgel, DPM as Consulting Physician (Podiatry) Webb Laws, Laie as Referring Physician (Optometry) [place], Peter M, MD as Consulting Physician (Cardiology) Ronald Lobo, MD as Consulting Physician (Gastroenterology) Susa Day, MD as Consulting Physician (Orthopedic Surgery) Ardis Hughs, MD as Attending Physician (Urology) Carleene Mains, [facility] as Pharmacist (Pharmacist)   SURGICAL HISTORY He  has a past surgical history that includes Posterior fusion cervical spine (07-05-2010); Esophagogastroduodenoscopy (09/26/2011); Robot assisted laparoscopic radical prostatectomy (02/07/2012); wisdom teeth extracted  (1982); left and right heart catheterization with coronary angiogram (N/A, 08/25/2011); Anal fissure repair (06-05-2002); Cardiovascular stress test (01-01-2008); transthoracic echocardiogram (08-03-2011); Posterior lumbar fusion (05-18-2014); Uvulopalatopharyngoplasty  (uppp)/tonsillectomy/septoplasty (1990's); Knee arthroscopy with medial menisectomy (Left, 12/31/2014); Cardiac catheterization; Back surgery; Total knee arthroplasty (Left, 08/05/2015); Gastroc recession extremity (Left, 06/27/2017); Achilles tendon surgery (Left, 06/27/2017); Tendon transfer (Left, 06/27/2017); Carpal tunnel release (Left, 03/27/2018); and Carpal tunnel release (Right, 06/2018). FAMILY HISTORY His family history includes Cancer in his father; Dementia in his mother; Heart attack in his maternal grandmother; Hyperlipidemia in his sister; Hypertension in his  mother, paternal grandfather, and sister; Osteoarthritis in his mother; Osteoporosis in his mother; Sleep apnea in his son and son; Stroke in his maternal grandmother. SOCIAL HISTORY He  reports that he has never smoked. He has never used smokeless tobacco. He reports that he does not drink alcohol and does not use drugs.  MEDICARE WELLNESS OBJECTIVES: Physical activity:   Cardiac risk factors: Cardiac Risk Factors include: advanced age (>9men, >97 women);obesity (BMI >30kg/m2);diabetes mellitus;dyslipidemia;hypertension;male gender Depression/mood screen:      03/22/2022   10:19 AM  Depression screen PHQ 2/9  Decreased Interest 0  Down, Depressed, Hopeless 1  PHQ - 2 Score 1    ADLs:     03/22/2022   10:20 AM 03/06/2022    8:39 PM  In your present state of health, do you have any difficulty performing the following activities:  Hearing? 0 0  Vision? 0   Difficulty concentrating or making decisions? 0 0  Walking or climbing stairs? 1 0  Dressing or bathing? 0 0  Doing errands, shopping? 0 0     Cognitive Testing  Alert? Yes  Normal Appearance?Yes  Oriented to person? Yes  Place? Yes   Time? Yes  Recall of three objects?  Yes  Can perform simple calculations? Yes  Displays appropriate judgment?Yes  Can read the correct time from a watch face?Yes  EOL planning: Does Patient Have a Medical Advance Directive?:  No Would patient like information on creating a medical advance directive?: No - Patient declined     Review of Systems:  Review of Systems  Constitutional:  Negative for chills, diaphoresis, fever, malaise/fatigue and weight loss.  HENT:  Negative for congestion, ear pain, hearing loss, sore throat and tinnitus.   Eyes: Negative.  Negative for blurred vision and double vision.  Respiratory:  Negative for cough, sputum production, shortness of breath and wheezing.   Cardiovascular:  Negative for chest pain, palpitations, orthopnea, claudication, leg swelling and PND.  Gastrointestinal:  Negative for abdominal pain, blood in stool, constipation, diarrhea, heartburn, melena, nausea and vomiting.  Genitourinary: Negative.   Musculoskeletal:  Positive for back pain. Negative for falls, joint pain and myalgias.  Skin: Negative.  Negative for rash.  Neurological:  Positive for tingling (bil fingers) and sensory change (chronic numbness of bil feet). Negative for dizziness, loss of consciousness, weakness and headaches.  Endo/Heme/Allergies:  Negative for polydipsia. Bruises/bleeds easily.  Psychiatric/Behavioral: Negative.  Negative for depression, memory loss, substance abuse and suicidal ideas. The patient is not nervous/anxious and does not have insomnia.   All other systems reviewed and are negative.   Physical Exam: BP 132/76   Pulse 75   Temp (!) 97.5 F (36.4 C)   Ht 6\' 1"  (1.854 m)   Wt 284 lb (128.8 kg)   SpO2 97%   BMI 37.47 kg/m  Wt Readings from Last 3 Encounters:  03/22/22 284 lb (128.8 kg)  02/28/22 283 lb 9.6 oz (128.6 kg)  12/16/21 288 lb (130.6 kg)   General Appearance: Well nourished well developed, non-toxic appearing, in no apparent distress. Eyes: PERRLA, EOMs, conjunctiva no swelling or erythema ENT/Mouth: Ear canals clear with no erythema, swelling, or discharge.  TMs normal bilaterally, oropharynx clear, moist, with no exudate.   Neck: Supple, thyroid  normal, no JVD, no cervical adenopathy.  Respiratory: Respiratory effort normal, breath sounds clear A&P, no wheeze, rhonchi or rales noted.  No retractions, no accessory muscle usage Cardio: RRR with no MRGs. No edema noted. Intact pedal and post tibial pulses,  arm feet.  Abdomen: Soft, + BS, morbidly obese abdomen limiting exam.  Non tender, no guarding, rebound, hernias, he has mild ventral hernia with bearing down. Musculoskeletal: Full ROM, 5/5 strength, antalgic gait Skin: Warm, dry without rashes, many ecchymoses of arms. Calluses to bilateral feet without ulcers or wounds. Neuro: Awake and oriented X 3, Cranial nerves intact. No cerebellar symptoms. He has 0/10 to monofilament in bilateral feet.  Psych: normal affect, Insight and Judgment appropriate.       Alycia Rossetti, NP 12:19 PM [facility]

## 2022-03-22 ENCOUNTER — Encounter: Payer: Self-pay | Admitting: Nurse Practitioner

## 2022-03-22 ENCOUNTER — Ambulatory Visit (INDEPENDENT_AMBULATORY_CARE_PROVIDER_SITE_OTHER): Payer: Medicare Other | Admitting: Nurse Practitioner

## 2022-03-22 VITALS — BP 132/76 | HR 75 | Temp 97.5°F | Ht 73.0 in | Wt 284.0 lb

## 2022-03-22 DIAGNOSIS — G2581 Restless legs syndrome: Secondary | ICD-10-CM | POA: Diagnosis not present

## 2022-03-22 DIAGNOSIS — N182 Chronic kidney disease, stage 2 (mild): Secondary | ICD-10-CM

## 2022-03-22 DIAGNOSIS — R2681 Unsteadiness on feet: Secondary | ICD-10-CM

## 2022-03-22 DIAGNOSIS — Z0001 Encounter for general adult medical examination with abnormal findings: Secondary | ICD-10-CM | POA: Diagnosis not present

## 2022-03-22 DIAGNOSIS — E1122 Type 2 diabetes mellitus with diabetic chronic kidney disease: Secondary | ICD-10-CM

## 2022-03-22 DIAGNOSIS — G4733 Obstructive sleep apnea (adult) (pediatric): Secondary | ICD-10-CM | POA: Diagnosis not present

## 2022-03-22 DIAGNOSIS — E1142 Type 2 diabetes mellitus with diabetic polyneuropathy: Secondary | ICD-10-CM | POA: Diagnosis not present

## 2022-03-22 DIAGNOSIS — M1A00X0 Idiopathic chronic gout, unspecified site, without tophus (tophi): Secondary | ICD-10-CM

## 2022-03-22 DIAGNOSIS — E1169 Type 2 diabetes mellitus with other specified complication: Secondary | ICD-10-CM | POA: Diagnosis not present

## 2022-03-22 DIAGNOSIS — E662 Morbid (severe) obesity with alveolar hypoventilation: Secondary | ICD-10-CM

## 2022-03-22 DIAGNOSIS — E785 Hyperlipidemia, unspecified: Secondary | ICD-10-CM

## 2022-03-22 DIAGNOSIS — E559 Vitamin D deficiency, unspecified: Secondary | ICD-10-CM

## 2022-03-22 DIAGNOSIS — Z79899 Other long term (current) drug therapy: Secondary | ICD-10-CM

## 2022-03-22 DIAGNOSIS — M1712 Unilateral primary osteoarthritis, left knee: Secondary | ICD-10-CM

## 2022-03-22 DIAGNOSIS — R6889 Other general symptoms and signs: Secondary | ICD-10-CM | POA: Diagnosis not present

## 2022-03-22 DIAGNOSIS — I70209 Unspecified atherosclerosis of native arteries of extremities, unspecified extremity: Secondary | ICD-10-CM | POA: Diagnosis not present

## 2022-03-22 DIAGNOSIS — Z0000 Encounter for general adult medical examination without abnormal findings: Secondary | ICD-10-CM

## 2022-03-22 DIAGNOSIS — I10 Essential (primary) hypertension: Secondary | ICD-10-CM

## 2022-03-22 MED ORDER — ATORVASTATIN CALCIUM 20 MG PO TABS: 20.0000 mg | ORAL_TABLET | Freq: Every day | ORAL | 3 refills | Status: DC

## 2022-03-22 NOTE — Patient Instructions (Signed)

## 2022-03-23 LAB — CBC WITH DIFFERENTIAL/PLATELET
Absolute Monocytes: 537 cells/uL (ref 200–950)
Basophils Absolute: 63 cells/uL (ref 0–200)
Basophils Relative: 0.8 %
Eosinophils Absolute: 126 cells/uL (ref 15–500)
Eosinophils Relative: 1.6 %
HCT: 38.3 % — ABNORMAL LOW (ref 38.5–50.0)
Hemoglobin: 13.3 g/dL (ref 13.2–17.1)
Lymphs Abs: 1343 cells/uL (ref 850–3900)
MCH: 32.6 pg (ref 27.0–33.0)
MCHC: 34.7 g/dL (ref 32.0–36.0)
MCV: 93.9 fL (ref 80.0–100.0)
MPV: 9.8 fL (ref 7.5–12.5)
Monocytes Relative: 6.8 %
Neutro Abs: 5830 cells/uL (ref 1500–7800)
Neutrophils Relative %: 73.8 %
Platelets: 279 10*3/uL (ref 140–400)
RBC: 4.08 10*6/uL — ABNORMAL LOW (ref 4.20–5.80)
RDW: 13.4 % (ref 11.0–15.0)
Total Lymphocyte: 17 %
WBC: 7.9 10*3/uL (ref 3.8–10.8)

## 2022-03-23 LAB — HEMOGLOBIN A1C
Hgb A1c MFr Bld: 6.7 % of total Hgb — ABNORMAL HIGH (ref <5.7)
Mean Plasma Glucose: 146 mg/dL
eAG (mmol/L): 8.1 mmol/L

## 2022-03-23 LAB — LIPID PANEL
Cholesterol: 91 mg/dL (ref <200)
HDL: 31 mg/dL — ABNORMAL LOW (ref >=40)
LDL Cholesterol (Calc): 34 mg/dL (calc)
Non-HDL Cholesterol (Calc): 60 mg/dL (calc) (ref <130)
Total CHOL/HDL Ratio: 2.9 (calc) (ref <5.0)
Triglycerides: 193 mg/dL — ABNORMAL HIGH (ref <150)

## 2022-03-23 LAB — COMPLETE METABOLIC PANEL WITH GFR
AG Ratio: 1.7 (calc) (ref 1.0–2.5)
ALT: 10 U/L (ref 9–46)
AST: 10 U/L (ref 10–35)
Albumin: 4 g/dL (ref 3.6–5.1)
Alkaline phosphatase (APISO): 63 U/L (ref 35–144)
BUN: 13 mg/dL (ref 7–25)
CO2: 27 mmol/L (ref 20–32)
Calcium: 9.4 mg/dL (ref 8.6–10.3)
Chloride: 103 mmol/L (ref 98–110)
Creat: 0.76 mg/dL (ref 0.70–1.35)
Globulin: 2.4 g/dL (calc) (ref 1.9–3.7)
Glucose, Bld: 141 mg/dL — ABNORMAL HIGH (ref 65–99)
Potassium: 4.4 mmol/L (ref 3.5–5.3)
Sodium: 139 mmol/L (ref 135–146)
Total Bilirubin: 0.6 mg/dL (ref 0.2–1.2)
Total Protein: 6.4 g/dL (ref 6.1–8.1)
eGFR: 97 mL/min/{1.73_m2} (ref >=60)

## 2022-03-27 ENCOUNTER — Other Ambulatory Visit: Payer: Self-pay

## 2022-03-27 DIAGNOSIS — E1122 Type 2 diabetes mellitus with diabetic chronic kidney disease: Secondary | ICD-10-CM

## 2022-03-27 MED ORDER — METFORMIN HCL ER 500 MG PO TB24: ORAL_TABLET | ORAL | 3 refills | Status: DC

## 2022-03-27 MED ORDER — PANTOPRAZOLE SODIUM 40 MG PO TBEC: DELAYED_RELEASE_TABLET | ORAL | 3 refills | Status: DC

## 2022-03-31 ENCOUNTER — Inpatient Hospital Stay: Payer: Medicare Other | Admitting: Oncology

## 2022-03-31 ENCOUNTER — Inpatient Hospital Stay: Payer: Medicare Other | Attending: Oncology

## 2022-03-31 ENCOUNTER — Encounter: Payer: Self-pay | Admitting: Oncology

## 2022-03-31 VITALS — BP 152/79 | HR 67 | Temp 98.1°F | Resp 18 | Ht 73.0 in | Wt 280.0 lb

## 2022-03-31 DIAGNOSIS — D509 Iron deficiency anemia, unspecified: Secondary | ICD-10-CM

## 2022-03-31 DIAGNOSIS — D649 Anemia, unspecified: Secondary | ICD-10-CM | POA: Diagnosis not present

## 2022-03-31 DIAGNOSIS — I129 Hypertensive chronic kidney disease with stage 1 through stage 4 chronic kidney disease, or unspecified chronic kidney disease: Secondary | ICD-10-CM | POA: Insufficient documentation

## 2022-03-31 DIAGNOSIS — N189 Chronic kidney disease, unspecified: Secondary | ICD-10-CM | POA: Insufficient documentation

## 2022-03-31 DIAGNOSIS — E1122 Type 2 diabetes mellitus with diabetic chronic kidney disease: Secondary | ICD-10-CM | POA: Diagnosis not present

## 2022-03-31 LAB — CBC WITH DIFFERENTIAL (CANCER CENTER ONLY)
Abs Immature Granulocytes: 0.04 10*3/uL (ref 0.00–0.07)
Basophils Absolute: 0.1 10*3/uL (ref 0.0–0.1)
Basophils Relative: 1 %
Eosinophils Absolute: 0.1 10*3/uL (ref 0.0–0.5)
Eosinophils Relative: 2 %
HCT: 39.1 % (ref 39.0–52.0)
Hemoglobin: 13.3 g/dL (ref 13.0–17.0)
Immature Granulocytes: 1 %
Lymphocytes Relative: 21 %
Lymphs Abs: 1.6 10*3/uL (ref 0.7–4.0)
MCH: 32.8 pg (ref 26.0–34.0)
MCHC: 34 g/dL (ref 30.0–36.0)
MCV: 96.3 fL (ref 80.0–100.0)
Monocytes Absolute: 0.5 10*3/uL (ref 0.1–1.0)
Monocytes Relative: 7 %
Neutro Abs: 5.1 10*3/uL (ref 1.7–7.7)
Neutrophils Relative %: 68 %
Platelet Count: 239 10*3/uL (ref 150–400)
RBC: 4.06 MIL/uL — ABNORMAL LOW (ref 4.22–5.81)
RDW: 14.6 % (ref 11.5–15.5)
WBC Count: 7.4 10*3/uL (ref 4.0–10.5)
nRBC: 0 % (ref 0.0–0.2)

## 2022-03-31 LAB — FERRITIN: Ferritin: 28 ng/mL (ref 24–336)

## 2022-03-31 NOTE — Progress Notes (Signed)
  [facility] OFFICE PROGRESS NOTE   Diagnosis: Anemia  INTERVAL HISTORY:   Jason Wyatt returns as scheduled.  He is taking iron.  No bleeding.  No nausea or dysphagia.  He reports a diminished appetite since his wife died in 12/10/2021.  He has intermittent exertional dyspnea.  Objective:  Vital signs in last 24 hours:  Blood pressure (!) 152/79, pulse 67, temperature 98.1 F (36.7 C), temperature source Oral, resp. rate 18, height 6\' 1"  (1.854 m), weight 280 lb (127 kg), SpO2 99 %.  Resp: Distant breath sounds, no respiratory distress Cardio: Regular rate and rhythm, distant heart sounds GI: Nontender, no mass, no hepatosplenomegaly Vascular: No leg edema   Lab Results:  Lab Results  Component Value Date   WBC 7.4 03/31/2022   HGB 13.3 03/31/2022   HCT 39.1 03/31/2022   MCV 96.3 03/31/2022   PLT 239 03/31/2022   NEUTROABS 5.1 03/31/2022    CMP  Lab Results  Component Value Date   NA 139 03/22/2022   K 4.4 03/22/2022   CL 103 03/22/2022   CO2 27 03/22/2022   GLUCOSE 141 (H) 03/22/2022   BUN 13 03/22/2022   CREATININE 0.76 03/22/2022   CALCIUM 9.4 03/22/2022   PROT 6.4 03/22/2022   ALBUMIN 4.3 06/08/2016   AST 10 03/22/2022   ALT 10 03/22/2022   ALKPHOS 66 06/08/2016   BILITOT 0.6 03/22/2022   GFRNONAA >60 08/16/2020   GFRAA 93 07/22/2020    No results found for: "CEA1", "CEA", "CAN199", "CA125"  Lab Results  Component Value Date   INR 1.06 07/28/2015   LABPROT 13.6 07/28/2015    Imaging:  No results found.  Medications: I have reviewed the patient's current medications.   Assessment/Plan: Mild anemia Low ferritin 07/06/2021, 03/04/2020 03/16/2020, 07/22/2021 negative stool Hemoccult cards Urine negative for blood 07/21/2021 Diabetes Hypertension CKD    Disposition: Jason Wyatt has a history of mild anemia and a low ferritin level.  The hemoglobin is in the low normal range today.  He continues iron.  Will follow-up on the ferritin  level from today.  He underwent a colonoscopy with Dr. Paulita Fujita in 2021-12-10.  He reports undergoing an upper endoscopy.  We will request this report.  Jason Wyatt will return for an office visit in 6 months.  Betsy Coder, MD  03/31/2022  8:32 AM

## 2022-04-04 ENCOUNTER — Telehealth: Payer: Self-pay | Admitting: Podiatry

## 2022-04-04 ENCOUNTER — Telehealth: Payer: Self-pay

## 2022-04-04 NOTE — Telephone Encounter (Signed)
-----   Message from Ladell Pier, MD sent at 04/04/2022  4:34 PM EDT ----- Please call patient, the hemoglobin and ferritin are in the low normal range, continue iron, follow-up as scheduled

## 2022-04-04 NOTE — Telephone Encounter (Signed)
Patient gave verbal understanding had no further questions or concerns. 

## 2022-04-04 NOTE — Telephone Encounter (Signed)
Lmom for pt to schedule appt to try on diabetic shoes.

## 2022-04-05 ENCOUNTER — Ambulatory Visit (INDEPENDENT_AMBULATORY_CARE_PROVIDER_SITE_OTHER): Payer: Medicare Other

## 2022-04-05 DIAGNOSIS — M791 Myalgia, unspecified site: Secondary | ICD-10-CM | POA: Diagnosis not present

## 2022-04-05 DIAGNOSIS — E1142 Type 2 diabetes mellitus with diabetic polyneuropathy: Secondary | ICD-10-CM

## 2022-04-05 DIAGNOSIS — M4322 Fusion of spine, cervical region: Secondary | ICD-10-CM | POA: Diagnosis not present

## 2022-04-05 DIAGNOSIS — M2041 Other hammer toe(s) (acquired), right foot: Secondary | ICD-10-CM

## 2022-04-05 DIAGNOSIS — M542 Cervicalgia: Secondary | ICD-10-CM | POA: Diagnosis not present

## 2022-04-05 DIAGNOSIS — M2042 Other hammer toe(s) (acquired), left foot: Secondary | ICD-10-CM

## 2022-04-05 DIAGNOSIS — M4326 Fusion of spine, lumbar region: Secondary | ICD-10-CM | POA: Diagnosis not present

## 2022-04-05 NOTE — Progress Notes (Signed)
Patient presents today to pick up diabetic shoes and insoles.  He tried on the shoes with the insoles and the fit was too tight in the toe area.  New shoe has been ordered in a 15 XW.

## 2022-04-07 ENCOUNTER — Other Ambulatory Visit: Payer: Self-pay | Admitting: Nurse Practitioner

## 2022-04-07 ENCOUNTER — Encounter: Payer: Self-pay | Admitting: *Deleted

## 2022-04-07 DIAGNOSIS — N401 Enlarged prostate with lower urinary tract symptoms: Secondary | ICD-10-CM

## 2022-04-10 ENCOUNTER — Other Ambulatory Visit: Payer: Self-pay | Admitting: *Deleted

## 2022-04-10 ENCOUNTER — Other Ambulatory Visit: Payer: Self-pay

## 2022-04-10 DIAGNOSIS — M542 Cervicalgia: Secondary | ICD-10-CM

## 2022-04-10 DIAGNOSIS — M4322 Fusion of spine, cervical region: Secondary | ICD-10-CM

## 2022-04-10 MED ORDER — CARBIDOPA-LEVODOPA 25-250 MG PO TABS: ORAL_TABLET | ORAL | 3 refills | Status: DC

## 2022-04-10 MED ORDER — ALLOPURINOL 300 MG PO TABS: ORAL_TABLET | ORAL | 3 refills | Status: DC

## 2022-04-24 ENCOUNTER — Encounter: Payer: Self-pay | Admitting: Nurse Practitioner

## 2022-04-24 ENCOUNTER — Ambulatory Visit (INDEPENDENT_AMBULATORY_CARE_PROVIDER_SITE_OTHER): Payer: Medicare Other | Admitting: Nurse Practitioner

## 2022-04-24 VITALS — BP 88/52 | HR 75 | Temp 97.2°F | Ht 73.0 in | Wt 269.2 lb

## 2022-04-24 DIAGNOSIS — F321 Major depressive disorder, single episode, moderate: Secondary | ICD-10-CM

## 2022-04-24 DIAGNOSIS — Z1152 Encounter for screening for COVID-19: Secondary | ICD-10-CM

## 2022-04-24 DIAGNOSIS — R6889 Other general symptoms and signs: Secondary | ICD-10-CM

## 2022-04-24 DIAGNOSIS — J301 Allergic rhinitis due to pollen: Secondary | ICD-10-CM | POA: Diagnosis not present

## 2022-04-24 DIAGNOSIS — I10 Essential (primary) hypertension: Secondary | ICD-10-CM

## 2022-04-24 LAB — POC COVID19 BINAXNOW: SARS Coronavirus 2 Ag: NEGATIVE

## 2022-04-24 LAB — POCT INFLUENZA A/B
Influenza A, POC: NEGATIVE
Influenza B, POC: NEGATIVE

## 2022-04-24 MED ORDER — FLUTICASONE PROPIONATE 50 MCG/ACT NA SUSP
NASAL | 2 refills | Status: DC
Start: 2022-04-24 — End: 2022-06-08

## 2022-04-24 NOTE — Patient Instructions (Signed)
Cut bisoprolol in half and take 1/2 pill daily.  Check BP twice a day and if BP less than 110/70 do not take Olmesartan  Keep BP log to bring in 2 weeks  Managing Depression, Adult Depression is a mental health condition that affects your thoughts, feelings, and actions. Being diagnosed with depression can bring you relief if you did not know why you have felt or behaved a certain way. It could also leave you feeling overwhelmed. Finding ways to manage your symptoms can help you feel more positive about your future. How to manage lifestyle changes Being depressed is difficult. Depression can increase the level of everyday stress. Stress can make depression symptoms worse. You may believe your symptoms cannot be managed or will never improve. However, there are many things you can try to help manage your symptoms. There is hope. Managing stress  Stress is your body's reaction to life changes and events, both good and bad. Stress can add to your feelings of depression. Learning to manage your stress can help lessen your feelings of depression. Try some of the following approaches to reducing your stress (stress reduction techniques): Listen to music that you enjoy and that inspires you. Try using a meditation app or take a meditation class. Develop a practice that helps you connect with your spiritual self. Walk in nature, pray, or go to a place of worship. Practice deep breathing. To do this, inhale slowly through your nose. Pause at the top of your inhale for a few seconds and then exhale slowly, letting yourself relax. Repeat this three or four times. Practice yoga to help relax and work your muscles. Choose a stress reduction technique that works for you. These techniques take time and practice to develop. Set aside 5-15 minutes a day to do them. Therapists can offer training in these techniques. Do these things to help manage stress: Keep a journal. Know your limits. Set healthy boundaries for  yourself and others, such as saying "no" when you think something is too much. Pay attention to how you react to certain situations. You may not be able to control everything, but you can change your reaction. Add humor to your life by watching funny movies or shows. Make time for activities that you enjoy and that relax you. Spend less time using electronics, especially at night before bed. The light from screens can make your brain think it is time to get up rather than go to bed.  Medicines Medicines, such as antidepressants, are often a part of treatment for depression. Talk with your pharmacist or health care provider about all the medicines, supplements, and herbal products that you take, their possible side effects, and what medicines and other products are safe to take together. Make sure to report any side effects you may have to your health care provider. Relationships Your health care provider may suggest family therapy, couples therapy, or individual therapy as part of your treatment. How to recognize changes Everyone responds differently to treatment for depression. As you recover from depression, you may start to: Have more interest in doing activities. Feel more hopeful. Have more energy. Eat a more regular amount of food. Have better mental focus. It is important to recognize if your depression is not getting better or is getting worse. The symptoms you had in the beginning may return, such as: Feeling tired. Eating too much or too little. Sleeping too much or too little. Feeling restless, agitated, or hopeless. Trouble focusing or making decisions. Having unexplained  aches and pains. Feeling irritable, angry, or aggressive. If you or your family members notice these symptoms coming back, let your health care provider know right away. Follow these instructions at home: Activity Try to get some form of exercise each day, such as walking. Try yoga, mindfulness, or other  stress reduction techniques. Participate in group activities if you are able. Lifestyle Get enough sleep. Cut down on or stop using caffeine, tobacco, alcohol, and any other harmful substances. Eat a healthy diet that includes plenty of vegetables, fruits, whole grains, low-fat dairy products, and lean protein. Limit foods that are high in solid fats, added sugar, or salt (sodium). General instructions Take over-the-counter and prescription medicines only as told by your health care provider. Keep all follow-up visits. It is important for your health care provider to check on your mood, behavior, and medicines. Your health care provider may need to make changes to your treatment. Where to find support Talking to others  Friends and family members can be sources of support and guidance. Talk to trusted friends or family members about your condition. Explain your symptoms and let them know that you are working with a health care provider to treat your depression. Tell friends and family how they can help. Finances Find mental health providers that fit with your financial situation. Talk with your health care provider if you are worried about access to food, housing, or medicine. Call your insurance company to learn about your co-pays and prescription plan. Where to find more information You can find support in your area from: Anxiety and Depression Association of America (ADAA): adaa.org Mental Health America: mentalhealthamerica.net [employer] on Mental Illness: nami.org Contact a health care provider if: You stop taking your antidepressant medicines, and you have any of these symptoms: Nausea. Headache. Light-headedness. Chills and body aches. Not being able to sleep (insomnia). You or your friends and family think your depression is getting worse. Get help right away if: You have thoughts of hurting yourself or others. Get help right away if you feel like you may hurt yourself  or others, or have thoughts about taking your own life. Go to your nearest emergency room or: Call 911. Call the National Suicide Prevention Lifeline at 4692764916 or 988. This is open 24 hours a day. Text the Crisis Text Line at (708) 536-7607. This information is not intended to replace advice given to you by your health care provider. Make sure you discuss any questions you have with your health care provider. Document Revised: 05/10/2021 Document Reviewed: 05/10/2021 Elsevier Patient Education  2023 [employer].

## 2022-04-24 NOTE — Progress Notes (Signed)
Assessment and Plan:  Jason Wyatt was seen today for acute visit.  Diagnoses and all orders for this visit:  Essential hypertension Cut bisoprolol in half and take 1/2 pill daily.  Check BP twice a day and if BP less than 110/70 do not take Olmesartan Keep BP log to bring in 2 weeks F/U in 2 weeks for reevaluation  Depression Continue current dose of Cymbalta Try to eat 3 meals a day and practice good sleep hygiene Follow up in 2 weeks, if BP is better controlled and increased fatigue persists will add Rexulti for depression  Flu-like symptoms -     POCT Influenza A/B- negative  Encounter for screening for COVID-19 -     POC COVID-19- Negative  Seasonal allergic rhinitis due to pollen Continue Allegra and Mucinex Start Flonase 2 sprays each nostril once a day If symptoms do not improve notify the office -     fluticasone (FLONASE) 50 MCG/ACT nasal spray; 2 sprays each nostril once a day       Further disposition pending results of labs. Discussed med's effects and SE's.   Over 30 minutes of exam, counseling, chart review, and critical decision making was performed.   Future Appointments  Date Time Provider Department Center  05/03/2022  7:40 AM [facility] MR 3 [facility]  05/30/2022  8:45 AM [name], [name], DPM [facility]  07/06/2022  9:30 AM Adela Glimpse, NP [facility] None  09/29/2022  7:45 AM [facility] PHLEBOTOMIST [facility] None  09/29/2022  8:00 AM Ladene Artist, MD [facility] None  12/19/2022 10:00 AM Raynelle Dick, NP [facility] None  03/20/2023 10:00 AM Raynelle Dick, NP [facility] None    ------------------------------------------------------------------------------------------------------------------   HPI BP (!) 88/52   Pulse 75   Temp (!) 97.2 F (36.2 C)   Ht 6\' 1"  (1.854 m)   Wt 269 lb 3.2 oz (122.1 kg)   SpO2 98%   BMI 35.52 kg/m   67 y.o.male presents for fatigue, diarrhea, chills, no appetite, runny nose, productive cough-  uncertain of color.  Covid test at home was negative Was taking allegra and Mucinex and coricidin.   He is currently on Olmesartan 40 mg QD and bisoprolol 10 mg QD. Has been feeling fatigued and dizzy.  BP Readings from Last 3 Encounters:  04/24/22 (!) 88/52  03/31/22 (!) 152/79  03/22/22 132/76   Pulse Readings from Last 3 Encounters:  04/24/22 75  03/31/22 67  03/22/22 75   He has been having more difficulty sleeping, changed sides of bed and sleeps better on his side of bed.  He wakes up to go to the bathroom 1 time a night at 3 am and sometime up to 4 times a night  BMI is Body mass index is 35.52 kg/m., he has not been working on diet and exercise. States he does not have an appetite. He eats bacon [place] and toast for breakfast.  Occ eats lunch (sandwich around 2-3 pm) and usually not hungry for dinner Wt Readings from Last 3 Encounters:  04/24/22 269 lb 3.2 oz (122.1 kg)  03/31/22 280 lb (127 kg)  03/22/22 284 lb (128.8 kg)     Past Medical History:  Diagnosis Date   Acute meniscal tear of left knee    Allergic rhinitis    Arthritis     back   Borderline glaucoma    Chronic back pain    stenosis   CKD (chronic kidney disease), stage II    Complication of  anesthesia cervical fusion surgery 06/2010   "woke up next day w/ventilator on" told due to OSA   GERD (gastroesophageal reflux disease)    takes Prevacid daily   Gout    takes Allopurinol daily and Colchicine if needed   History of adenomatous polyp of colon    tubular adenoma's   History of Barrett's esophagus    History of hiatal hernia    Hyperlipidemia    Hypertension    Incomplete right bundle branch block (RBBB)    OA (osteoarthritis)    knee   OSA on CPAP    Peripheral neuropathy    Prostate cancer [facility]) urologist-  dr Laverle Patter   dx 2013  s/p  radical non-nerve sparing prostatectomy 2014--  Stage pT2cNx,  PSA 3.48,  Gleason 3+3,  vol 55cc/  current PSA  0.01 (Aug 2016)   Rupture of Achilles tendon     Thyroid nodule    left side   Type II diabetes mellitus (HCC)    Urinary frequency    takes Flomax daily   Urine incontinence    Wears glasses      Allergies  Allergen Reactions   Fenofibrate Itching and Rash   Flaxseed Oil Rash   Flaxseed [Flaxseed (Linseed)] Rash   Lyrica [Pregabalin] Itching and Rash   Niaspan [Niacin Er] Rash    Current Outpatient Medications on File Prior to Visit  Medication Sig   Accu-Chek Softclix Lancets lancets USE WITH METER TO CHECK  BLOOD SUGAR ONCE DAILY   acetaminophen (TYLENOL) 500 MG tablet Take 500 mg by mouth every 6 (six) hours as needed for moderate pain.   allopurinol (ZYLOPRIM) 300 MG tablet TAKE 1 TABLET BY MOUTH  DAILY FOR GOUT PREVENTION   aspirin 81 MG tablet Take 81 mg by mouth daily.   atorvastatin (LIPITOR) 20 MG tablet Take 1 tablet (20 mg total) by mouth daily.   bisoprolol (ZEBETA) 10 MG tablet TAKE 1 TABLET BY MOUTH IN  THE MORNING FOR BLOOD  PRESSURE   Blood Glucose Monitoring Suppl (ONE TOUCH ULTRA 2) w/Device KIT SMARTSIG:Via Meter   carbidopa-levodopa (SINEMET IR) 25-250 MG tablet TAKE 1 TABLET BY MOUTH  TWICE DAILY FOR RESTLESS  LEGS   Cyanocobalamin (B-12) 2500 MCG SUBL Place under the tongue every other day.   dextromethorphan-guaiFENesin (MUCINEX DM) 30-600 MG 12hr tablet Take 1 tablet by mouth 2 (two) times daily as needed for cough.   DULoxetine (CYMBALTA) 60 MG capsule TAKE 1 CAPSULE BY MOUTH AT  BEDTIME   ferrous sulfate 325 (65 FE) MG tablet Take 325 mg by mouth daily with breakfast.   fexofenadine (ALLEGRA) 180 MG tablet Take 180 mg by mouth daily as needed.   gabapentin (NEURONTIN) 600 MG tablet TAKE 1/2 TO 1 TABLET BY  MOUTH 2 TO 3 TIMES DAILY AS NEEDED FOR PAIN (Patient taking differently: Take 600 mg by mouth 2 (two) times daily.)   glucose blood (ONETOUCH ULTRA) test strip USE TO CHECK BLOOD SUGAR  DAILY   Magnesium 400 MG CAPS Take 800 mg by mouth at bedtime.   meloxicam (MOBIC) 7.5 MG tablet Take 1 tablet  Daily  with Food  is needed for Pain   metFORMIN (GLUCOPHAGE-XR) 500 MG 24 hr tablet TAKE 2 TABLETS BY MOUTH  TWICE DAILY WITH MEALS FOR  DIABETES   Multiple Vitamin (MULTIVITAMIN) tablet Take 1 tablet by mouth daily.   olmesartan (BENICAR) 40 MG tablet TAKE 1 TABLET BY MOUTH  EVERY NIGHT FOR BLOOD  PRESSURE AND DIABETIC  KIDNEY PROTECTION   pantoprazole (PROTONIX) 40 MG tablet TAKE 1 TABLET DAILY TO  PREVENT HEARTBURN &amp;  INDIGESTION   tamsulosin (FLOMAX) 0.4 MG CAPS capsule TAKE 2 CAPSULES BY MOUTH AT  BEDTIME FOR PROSTATE   TRULICITY 4.5 MG/0.5ML SOPN Inject 1 pen  (4.5 mg) into Skin every 7 days   Vitamin D, Ergocalciferol, (DRISDOL) 1.25 MG (50000 UNIT) CAPS capsule TAKE 1 CAPSULE BY MOUTH 2 DAYS  WEEKLY FOR VITAMIN D DEFICIENCY   methocarbamol (ROBAXIN) 500 MG tablet Take 500 mg by mouth 3 (three) times daily as needed. PRN (Patient not taking: Reported on 04/24/2022)   sennosides-docusate sodium (SENOKOT-S) 8.6-50 MG tablet Take 1 tablet by mouth daily. (Patient not taking: Reported on 03/22/2022)   No current facility-administered medications on file prior to visit.    ROS: all negative except above.   Physical Exam:  BP (!) 88/52   Pulse 75   Temp (!) 97.2 F (36.2 C)   Ht 6\' 1"  (1.854 m)   Wt 269 lb 3.2 oz (122.1 kg)   SpO2 98%   BMI 35.52 kg/m   General Appearance: Obese male, flat affect appears fatigued, in no apparent distress. Eyes: PERRLA, EOMs, conjunctiva no swelling or erythema Sinuses: No Frontal/maxillary tenderness ENT/Mouth: Ext aud canals clear, TMs without erythema, bulging. No erythema, swelling, or exudate on post pharynx.  Tonsils not swollen or erythematous. Hearing normal.  Neck: Supple, thyroid normal.  Respiratory: Respiratory effort normal, BS equal bilaterally without rales, rhonchi, wheezing or stridor.  Cardio: RRR with no MRGs. Brisk peripheral pulses without edema.  Abdomen: Soft, + BS.  Non tender, no guarding, rebound, hernias,  masses. Lymphatics: Non tender without lymphadenopathy.  Musculoskeletal: Full ROM, 5/5 strength, normal gait.  Skin: Warm, dry without rashes, lesions, ecchymosis.  Neuro: Cranial nerves intact. Normal muscle tone, no cerebellar symptoms. Sensation intact.  Psych: Awake and oriented X 3, flat affect, Insight and Judgment appropriate.     Raynelle DickANA E , NP 10:04 AM [facility]

## 2022-04-26 ENCOUNTER — Other Ambulatory Visit: Payer: Self-pay | Admitting: Nurse Practitioner

## 2022-04-26 DIAGNOSIS — I10 Essential (primary) hypertension: Secondary | ICD-10-CM

## 2022-04-26 MED ORDER — BISOPROLOL FUMARATE 5 MG PO TABS
5.0000 mg | ORAL_TABLET | Freq: Every day | ORAL | 2 refills | Status: DC
Start: 2022-04-26 — End: 2022-06-15

## 2022-04-26 NOTE — Progress Notes (Signed)
BP has been fluctuating low at night and more elevated in the am  Has not been taking 1/2 dose of bisoprolol as it breaks into a bunch of pieces.  Has been also holding his Olmesartan since Sunday.  BP readings at night low 100's/70's, am readings 130-150/80's with pulse readings in 100-130.  Will have him hold Olmesartan.  Sent in new prescription for bisoprolol 5 mg to take daily in AM and continue to monitor BP and pulse.  Call if BP consistently greater than 130/80 or lower than 100/60

## 2022-05-02 ENCOUNTER — Other Ambulatory Visit: Payer: Self-pay | Admitting: Nurse Practitioner

## 2022-05-02 DIAGNOSIS — E1142 Type 2 diabetes mellitus with diabetic polyneuropathy: Secondary | ICD-10-CM

## 2022-05-02 DIAGNOSIS — Z79899 Other long term (current) drug therapy: Secondary | ICD-10-CM

## 2022-05-02 DIAGNOSIS — E559 Vitamin D deficiency, unspecified: Secondary | ICD-10-CM

## 2022-05-03 ENCOUNTER — Ambulatory Visit
Admission: RE | Admit: 2022-05-03 | Discharge: 2022-05-03 | Disposition: A | Discharge status: Home / Self Care | Payer: Medicare Other | Source: Ambulatory Visit | Attending: *Deleted | Admitting: *Deleted

## 2022-05-03 DIAGNOSIS — M542 Cervicalgia: Secondary | ICD-10-CM

## 2022-05-03 DIAGNOSIS — M4802 Spinal stenosis, cervical region: Secondary | ICD-10-CM | POA: Diagnosis not present

## 2022-05-03 DIAGNOSIS — M4322 Fusion of spine, cervical region: Secondary | ICD-10-CM

## 2022-05-08 ENCOUNTER — Telehealth: Payer: Self-pay | Admitting: Pharmacist

## 2022-05-08 NOTE — Progress Notes (Signed)
HC reviewing pts chart prior to general review call. Reviewing office visits, consults, hospital visits, lab and medication adherence/changes. LVMTRC.  Time ( )  05/09/2022- HC attempted outreach and unable to leave a message. BP  05/08/2022- HC connected with patient to complete review call. Pt is doing well aside from not much of an appetite since his wife's passing in November. Pt has lost weight and that caused his BP med's to be too strong and they were decreased earlier this month.Instructed patient to reach out if he needs any support or has any issues/concerns.   (Total time )

## 2022-05-08 NOTE — Progress Notes (Unsigned)
Assessment and Plan: Jason Wyatt was seen today for follow-up.  Diagnoses and all orders for this visit:  Current moderate episode of major depressive disorder, unspecified whether recurrent Continue Cymbalta and focus on diet and activity Has improved since last visit  Essential hypertension - continue medications, DASH diet, exercise and monitor at home. Call if greater than 130/80.  - Currently controlled on new treatment regimen  Type 2 diabetes mellitus with morbid obesity Continue medications: trulicity 4.5 mg SQ QW, Metformin 500 mg 2 tabs BID  Continue diet and exercise.  Perform daily foot/skin check, notify office of any concerning changes.  Check A1C    Gait instability Suggested neuro rehab but declines at this time Continue to monitor    Further disposition pending results of labs. Discussed med's effects and SE's.   Over 30 minutes of exam, counseling, chart review, and critical decision making was performed.   Future Appointments  Date Time Provider Department Center  05/09/2022 10:30 AM Raynelle Dick, NP [facility] None  05/17/2022 11:30 AM [facility]  05/30/2022  8:45 AM [name], [name], DPM [facility]  07/06/2022  9:30 AM Adela Glimpse, NP [facility] None  09/29/2022  7:45 AM [facility]LEBOTOM[facility] None  09/29/2022  8:00 AM Ladene Artist, MD [facility] None  12/19/2022 10:00 AM Raynelle Dick, NP [facility] None  03/20/2023 10:00 AM Raynelle Dick, NP [facility] None    ------------------------------------------------------------------------------------------------------------------   HPI BP 138/80   Pulse 66   Temp (!) 97.3 F (36.3 C)   Ht  (1.854 m)   Wt 280 lb 9.6 oz (127.3 kg)   SpO2 96%   BMI 37.02 kg/m   67 y.o.male presents for reevaluation of depression, weight and blood pressure  He is having issues with his allergies and using generic Allegra , Mucinex DM and Flonase. Controlling better  than without medications.   Last visit his olmesartan was decreased to 40 mg 1/2 tab QPM and bisoprolol was decreased from 10 mg to 5 mg QD. At home BP running 110-130/80-90's. He will notice some wobbling , off balance when walking. He has been having more energy  BP Readings from Last 3 Encounters:  05/09/22 138/80  04/24/22 (!) 88/52  03/31/22 (!) 152/79   BMI is Body mass index is 37.02 kg/m., he has been working on diet and exercise. He has been eating better but portions are smaller Wt Readings from Last 3 Encounters:  05/09/22 280 lb 9.6 oz (127.3 kg)  04/24/22 269 lb 3.2 oz (122.1 kg)  03/31/22 280 lb (127 kg)      Past Medical History:  Diagnosis Date   Acute meniscal tear of left knee    Allergic rhinitis    Arthritis     back   Borderline glaucoma    Chronic back pain    stenosis   CKD (chronic kidney disease), stage II    Complication of anesthesia cervical fusion surgery 06/2010   "woke up next day w/ventilator on" told due to OSA   GERD (gastroesophageal reflux disease)    takes Prevacid daily   Gout    takes Allopurinol daily and Colchicine if needed   History of adenomatous polyp of colon    tubular adenoma's   History of Barrett's esophagus    History of hiatal hernia    Hyperlipidemia    Hypertension    Incomplete right bundle branch block (RBBB)    OA (osteoarthritis)    knee   OSA  on CPAP    Peripheral neuropathy    Prostate cancer urologist-  dr Laverle Patter   dx 2013  s/p  radical non-nerve sparing prostatectomy 2014--  Stage pT2cNx,  PSA 3.48,  Gleason 3+3,  vol 55cc/  current PSA  0.01 (Aug 2016)   Rupture of Achilles tendon    Thyroid nodule    left side   Type II diabetes mellitus    Urinary frequency    takes Flomax daily   Urine incontinence    Wears glasses      Allergies  Allergen Reactions   Fenofibrate Itching and Rash   Flaxseed Oil Rash   Flaxseed [Flaxseed (Linseed)] Rash   Lyrica [Pregabalin] Itching and Rash   Niaspan  [Niacin Er] Rash    Current Outpatient Medications on File Prior to Visit  Medication Sig   Accu-Chek Softclix Lancets lancets USE WITH METER TO CHECK  BLOOD SUGAR ONCE DAILY   allopurinol (ZYLOPRIM) 300 MG tablet TAKE 1 TABLET BY MOUTH  DAILY FOR GOUT PREVENTION   aspirin 81 MG tablet Take 81 mg by mouth daily.   atorvastatin (LIPITOR) 20 MG tablet Take 1 tablet (20 mg total) by mouth daily.   bisoprolol (ZEBETA) 5 MG tablet Take 1 tablet (5 mg total) by mouth daily.   Blood Glucose Monitoring Suppl (ONE TOUCH ULTRA 2) w/Device KIT USE AS DIRECTED   carbidopa-levodopa (SINEMET IR) 25-250 MG tablet TAKE 1 TABLET BY MOUTH  TWICE DAILY FOR RESTLESS  LEGS   Cyanocobalamin (B-12) 2500 MCG SUBL Place under the tongue every other day.   dextromethorphan-guaiFENesin (MUCINEX DM) 30-600 MG 12hr tablet Take 1 tablet by mouth 2 (two) times daily as needed for cough.   DULoxetine (CYMBALTA) 60 MG capsule TAKE 1 CAPSULE BY MOUTH AT  BEDTIME   ferrous sulfate 325 (65 FE) MG tablet Take 325 mg by mouth daily with breakfast.   fexofenadine (ALLEGRA) 180 MG tablet Take 180 mg by mouth daily as needed.   fluticasone (FLONASE) 50 MCG/ACT nasal spray 2 sprays each nostril once a day   gabapentin (NEURONTIN) 600 MG tablet Take 1 tablet (600 mg total) by mouth 2 (two) times daily.   Magnesium 400 MG CAPS Take 800 mg by mouth at bedtime.   metFORMIN (GLUCOPHAGE-XR) 500 MG 24 hr tablet TAKE 2 TABLETS BY MOUTH  TWICE DAILY WITH MEALS FOR  DIABETES   methocarbamol (ROBAXIN) 500 MG tablet Take 500 mg by mouth 3 (three) times daily as needed. PRN   Multiple Vitamin (MULTIVITAMIN) tablet Take 1 tablet by mouth daily.   olmesartan (BENICAR) 40 MG tablet TAKE 1 TABLET BY MOUTH  EVERY NIGHT FOR BLOOD  PRESSURE AND DIABETIC  KIDNEY PROTECTION   ONETOUCH ULTRA test strip USE TO CHECK BLOOD SUGAR DAILY   pantoprazole (PROTONIX) 40 MG tablet TAKE 1 TABLET DAILY TO  PREVENT HEARTBURN &amp;  INDIGESTION   tamsulosin (FLOMAX)  0.4 MG CAPS capsule TAKE 2 CAPSULES BY MOUTH AT  BEDTIME FOR PROSTATE   TRULICITY 4.5 MG/0.5ML SOPN Inject 1 pen  (4.5 mg) into Skin every 7 days   Vitamin D, Ergocalciferol, (DRISDOL) 1.25 MG (50000 UNIT) CAPS capsule TAKE 1 CAPSULE BY MOUTH 2 DAYS  WEEKLY FOR VITAMIN D DEFICIENCY   acetaminophen (TYLENOL) 500 MG tablet Take 500 mg by mouth every 6 (six) hours as needed for moderate pain. (Patient not taking: Reported on 05/09/2022)   Acetaminophen-DM (CORICIDIN HBP COLD/COUGH/FLU PO) Take by mouth. (Patient not taking: Reported on 05/09/2022)   meloxicam (MOBIC)  7.5 MG tablet Take 1 tablet Daily  with Food  is needed for Pain (Patient not taking: Reported on 05/09/2022)   sennosides-docusate sodium (SENOKOT-S) 8.6-50 MG tablet Take 1 tablet by mouth daily. (Patient not taking: Reported on 03/22/2022)   No current facility-administered medications on file prior to visit.    ROS: all negative except above.   Physical Exam:  BP 138/80   Pulse 66   Temp (!) 97.3 F (36.3 C)   Ht 6\' 1"  (1.854 m)   Wt 280 lb 9.6 oz (127.3 kg)   SpO2 96%   BMI 37.02 kg/m   General Appearance: Pleasant obese male, in no apparent distress. Eyes: PERRLA, EOMs, conjunctiva no swelling or erythema Sinuses: No Frontal/maxillary tenderness ENT/Mouth: Ext aud canals clear, TMs without erythema, bulging. No erythema, swelling, or exudate on post pharynx.  Hearing normal.  Neck: Supple, thyroid normal.  Respiratory: Respiratory effort normal, BS equal bilaterally without rales, rhonchi, wheezing or stridor.  Cardio: RRR with no MRGs. Brisk peripheral pulses without edema.  Abdomen: Soft, + BS.  Non tender, no guarding, rebound, hernias, masses. Lymphatics: Non tender without lymphadenopathy.  Musculoskeletal: Full ROM, 5/5 strength, antalgic gait with cane Skin: Warm, dry without rashes, lesions, ecchymosis.  Neuro: Cranial nerves intact. Normal muscle tone, no cerebellar symptoms. Decreased sensation of feet  bilaterally.  Psych: Awake and oriented X 3, normal affect, Insight and Judgment appropriate.     Raynelle Dick, NP 10:29 AM [facility]

## 2022-05-09 ENCOUNTER — Encounter: Payer: Self-pay | Admitting: Nurse Practitioner

## 2022-05-09 ENCOUNTER — Ambulatory Visit (INDEPENDENT_AMBULATORY_CARE_PROVIDER_SITE_OTHER): Payer: Medicare Other | Admitting: Nurse Practitioner

## 2022-05-09 VITALS — BP 138/80 | HR 66 | Temp 97.3°F | Ht 73.0 in | Wt 280.6 lb

## 2022-05-09 DIAGNOSIS — R2681 Unsteadiness on feet: Secondary | ICD-10-CM

## 2022-05-09 DIAGNOSIS — I10 Essential (primary) hypertension: Secondary | ICD-10-CM | POA: Diagnosis not present

## 2022-05-09 DIAGNOSIS — E1122 Type 2 diabetes mellitus with diabetic chronic kidney disease: Secondary | ICD-10-CM

## 2022-05-09 DIAGNOSIS — F321 Major depressive disorder, single episode, moderate: Secondary | ICD-10-CM

## 2022-05-09 DIAGNOSIS — E1169 Type 2 diabetes mellitus with other specified complication: Secondary | ICD-10-CM

## 2022-05-09 NOTE — Patient Instructions (Signed)
How to Use a Cane  Canes are used to help with walking. Using a cane makes you more stable, reduces pain, and eases strain on certain muscle groups. There are various kinds of canes. Most have either a single point, four points (quad cane), or three points at the bottom. The best kind of cane for you depends on what you need it for. People with arthritis generally do well with a single-point cane. People who have certain neurological conditions, such as people who have had a stroke, may do better with a quad cane because it allows them to put more weight on it (support more of their body weight). How to choose a cane that fits It is important to use a cane that fits properly. A cane fits properly if the top of the cane comes to your wrist joint when you are standing upright with your arm relaxed at your side. How to use your cane Hold your cane in the hand opposite the injured or weaker side. Always move the cane and the foot of the weaker side with each other (in unison). Walking Put as much weight on the cane as necessary to make walking comfortable, stable, and smooth. Stand tall with good posture and look ahead, not down at your feet. Hold the cane about 2 inches (5 cm) in front or to the side of you. Each time you take a step with your injured leg, move the cane at the same time to help balance you. Going up steps Step first with your stronger foot. Move the cane and the weaker foot up the step at the same time. Always hold the railing with your free hand. Going down steps Step down first with the cane and your weaker foot. Then follow with your stronger foot. Always hold the railing with your free hand. Safety tips for home Take these steps to make your home safer when you are walking with a cane: Be familiar with your home environment. Have sturdy handrails in your bathrooms and hallways. Wear nonslip, comfortable, well-fitting shoes. Use night-lights in the dark. Keep floor  surfaces clean and dry. Keep high-traffic areas uncluttered. Remove any rugs, cords, or loose objects from the floor. Contact a health care provider if: You still feel unsteady on your feet while using the cane. You develop new pain, such as pain in your back, shoulder, wrist, or hip. You develop any numbness or tingling. Get help right away if: You fall. Summary Using a cane makes you more stable, reduces pain, and eases strain on certain muscle groups. A cane fits properly if the top of the cane comes to your wrist joint when you are standing upright with your arm relaxed at your side. The best kind of cane for you depends on what you need it for. Hold your cane in the hand opposite the injured or weaker side. Always move the cane and the foot of the weaker side with each other (in unison). This information is not intended to replace advice given to you by your health care provider. Make sure you discuss any questions you have with your health care provider. Document Revised: 10/21/2019 Document Reviewed: 10/21/2019 Elsevier Patient Education  2023 [employer]  

## 2022-05-15 DIAGNOSIS — M542 Cervicalgia: Secondary | ICD-10-CM | POA: Diagnosis not present

## 2022-05-15 DIAGNOSIS — M791 Myalgia, unspecified site: Secondary | ICD-10-CM | POA: Diagnosis not present

## 2022-05-15 DIAGNOSIS — M4322 Fusion of spine, cervical region: Secondary | ICD-10-CM | POA: Diagnosis not present

## 2022-05-15 DIAGNOSIS — M4326 Fusion of spine, lumbar region: Secondary | ICD-10-CM | POA: Diagnosis not present

## 2022-05-16 DIAGNOSIS — E1142 Type 2 diabetes mellitus with diabetic polyneuropathy: Secondary | ICD-10-CM | POA: Diagnosis not present

## 2022-05-16 DIAGNOSIS — G4709 Other insomnia: Secondary | ICD-10-CM | POA: Diagnosis not present

## 2022-05-17 ENCOUNTER — Other Ambulatory Visit: Payer: Medicare Other

## 2022-05-30 ENCOUNTER — Ambulatory Visit: Payer: Medicare Other | Admitting: Podiatry

## 2022-05-30 ENCOUNTER — Encounter: Payer: Self-pay | Admitting: Podiatry

## 2022-05-30 DIAGNOSIS — B351 Tinea unguium: Secondary | ICD-10-CM | POA: Diagnosis not present

## 2022-05-30 DIAGNOSIS — D2372 Other benign neoplasm of skin of left lower limb, including hip: Secondary | ICD-10-CM

## 2022-05-30 DIAGNOSIS — E1142 Type 2 diabetes mellitus with diabetic polyneuropathy: Secondary | ICD-10-CM | POA: Diagnosis not present

## 2022-05-30 DIAGNOSIS — M79676 Pain in unspecified toe(s): Secondary | ICD-10-CM | POA: Diagnosis not present

## 2022-05-30 DIAGNOSIS — D2371 Other benign neoplasm of skin of right lower limb, including hip: Secondary | ICD-10-CM | POA: Diagnosis not present

## 2022-05-30 NOTE — Progress Notes (Signed)
Presents today chief complaint of painful elongated toenails and calluses.  Objective: Pulses are palpable.  Neuropathy is diminished per Semmes Weinstein monofilament hammertoes mallet toes and bilateral.  This is resulting in benign skin lesions to the toes and plantar aspect of the forefoot bilateral.  No open lesions or wounds.  Toenails are long thick yellow dystrophic with mycotic.  Assessment: Diabetes mellitus diabetic peripheral neuropathy pain in limb secondary to onychomycosis peripheral neuropathy and benign skin lesions.  Plan: Debridement of nails and benign skin lesions.  Follow-up with him in 3 months

## 2022-06-08 ENCOUNTER — Other Ambulatory Visit: Payer: Self-pay

## 2022-06-08 DIAGNOSIS — J301 Allergic rhinitis due to pollen: Secondary | ICD-10-CM

## 2022-06-08 MED ORDER — FLUTICASONE PROPIONATE 50 MCG/ACT NA SUSP
NASAL | 2 refills | Status: DC
Start: 2022-06-08 — End: 2022-11-06

## 2022-06-14 DIAGNOSIS — M5412 Radiculopathy, cervical region: Secondary | ICD-10-CM | POA: Diagnosis not present

## 2022-06-15 ENCOUNTER — Telehealth: Payer: Self-pay | Admitting: Nurse Practitioner

## 2022-06-15 DIAGNOSIS — I10 Essential (primary) hypertension: Secondary | ICD-10-CM

## 2022-06-15 MED ORDER — BISOPROLOL FUMARATE 5 MG PO TABS
5.0000 mg | ORAL_TABLET | Freq: Every day | ORAL | 2 refills | Status: DC
Start: 2022-06-15 — End: 2022-07-11

## 2022-06-15 NOTE — Telephone Encounter (Signed)
Pt. Requesting refill for bisoprolol. Pls send to [facility] on file.

## 2022-06-15 NOTE — Addendum Note (Signed)
Addended by: Dionicio Stall on: 06/15/2022 10:56 AM   Modules accepted: Orders

## 2022-07-04 DIAGNOSIS — M4322 Fusion of spine, cervical region: Secondary | ICD-10-CM | POA: Diagnosis not present

## 2022-07-04 DIAGNOSIS — M4326 Fusion of spine, lumbar region: Secondary | ICD-10-CM | POA: Diagnosis not present

## 2022-07-04 DIAGNOSIS — M5412 Radiculopathy, cervical region: Secondary | ICD-10-CM | POA: Diagnosis not present

## 2022-07-06 ENCOUNTER — Ambulatory Visit (INDEPENDENT_AMBULATORY_CARE_PROVIDER_SITE_OTHER): Payer: Medicare Other | Admitting: Nurse Practitioner

## 2022-07-06 ENCOUNTER — Encounter: Payer: Self-pay | Admitting: Nurse Practitioner

## 2022-07-06 VITALS — BP 110/70 | HR 72 | Temp 96.8°F | Ht 73.0 in | Wt 278.8 lb

## 2022-07-06 DIAGNOSIS — I10 Essential (primary) hypertension: Secondary | ICD-10-CM | POA: Diagnosis not present

## 2022-07-06 DIAGNOSIS — R2681 Unsteadiness on feet: Secondary | ICD-10-CM | POA: Diagnosis not present

## 2022-07-06 DIAGNOSIS — M1712 Unilateral primary osteoarthritis, left knee: Secondary | ICD-10-CM

## 2022-07-06 DIAGNOSIS — J301 Allergic rhinitis due to pollen: Secondary | ICD-10-CM

## 2022-07-06 DIAGNOSIS — E559 Vitamin D deficiency, unspecified: Secondary | ICD-10-CM | POA: Diagnosis not present

## 2022-07-06 DIAGNOSIS — Z79899 Other long term (current) drug therapy: Secondary | ICD-10-CM

## 2022-07-06 DIAGNOSIS — E1142 Type 2 diabetes mellitus with diabetic polyneuropathy: Secondary | ICD-10-CM | POA: Diagnosis not present

## 2022-07-06 DIAGNOSIS — E662 Morbid (severe) obesity with alveolar hypoventilation: Secondary | ICD-10-CM | POA: Diagnosis not present

## 2022-07-06 DIAGNOSIS — E785 Hyperlipidemia, unspecified: Secondary | ICD-10-CM

## 2022-07-06 DIAGNOSIS — E1122 Type 2 diabetes mellitus with diabetic chronic kidney disease: Secondary | ICD-10-CM | POA: Diagnosis not present

## 2022-07-06 DIAGNOSIS — Z8546 Personal history of malignant neoplasm of prostate: Secondary | ICD-10-CM

## 2022-07-06 DIAGNOSIS — E1169 Type 2 diabetes mellitus with other specified complication: Secondary | ICD-10-CM

## 2022-07-06 DIAGNOSIS — R251 Tremor, unspecified: Secondary | ICD-10-CM

## 2022-07-06 DIAGNOSIS — G2581 Restless legs syndrome: Secondary | ICD-10-CM | POA: Diagnosis not present

## 2022-07-06 DIAGNOSIS — N182 Chronic kidney disease, stage 2 (mild): Secondary | ICD-10-CM

## 2022-07-06 MED ORDER — PROPRANOLOL HCL 40 MG PO TABS
40.0000 mg | ORAL_TABLET | Freq: Every day | ORAL | 0 refills | Status: DC
Start: 2022-07-06 — End: 2022-08-06

## 2022-07-06 NOTE — Patient Instructions (Addendum)
Monitor allergy index on the morning news to help identify what may be triggering intermittent symptoms of allergy flare, including nasal congestion and watery eyes  Start Propranolol for tremor.  Monitor BP.  If <90/60 and symptomatic including dizziness, fatigue, shortness of breath, contact office for medication evaluation.  Propranolol Extended-Release Capsules What is this medication? PROPRANOLOL (proe PRAN oh lole) treats many conditions such as high blood pressure and heart disease. It may also be used to prevent chest pain (angina). It works by lowering your blood pressure and heart rate, making it easier for your heart to pump blood to the rest of your body. It can also be used to prevent migraine headaches. It works by relaxing the blood vessels in the brain that cause migraines. It belongs to a group of medications called beta blockers. This medicine may be used for other purposes; ask your health care provider or pharmacist if you have questions. COMMON BRAND NAME(S): Inderal LA, Inderal XL, InnoPran XL What should I tell my care team before I take this medication? They need to know if you have any of these conditions: Diabetes Having surgery Heart or blood vessel conditions, such as slow heartbeat, heart failure, heart block Kidney disease Liver disease Lung or breathing disease, such as asthma or COPD Myasthenia gravis Pheochromocytoma Thyroid disease An unusual or allergic reaction to propranolol, other medications, foods, dyes, or preservatives Pregnant or trying to get pregnant Breastfeeding How should I use this medication? Take this medication by mouth with water. Take it as directed on the prescription label at the same time every day. Do not cut, crush, or chew this medication. Swallow the capsules whole. You can take it with or without food. If it upsets your stomach, take it with food. Keep taking it unless your care team tells you to stop. Talk to your care team about  the use of this medication in children. Special care may be needed. Overdosage: If you think you have taken too much of this medicine contact a poison control center or emergency room at once. NOTE: This medicine is only for you. Do not share this medicine with others. What if I miss a dose? If you miss a dose, take it as soon as you can. If it is almost time for your next dose, take only that dose. Do not take double or extra doses. What may interact with this medication? Do not take this medication with any of the following: Thioridazine This medication may also interact with the following: Certain medications for blood pressure, heart disease, irregular heartbeat Epinephrine NSAIDs, medications for pain and inflammation, such as ibuprofen or naproxen Warfarin Other medications may affect the way this medication works. Talk with your care team about all of the medications you take. They may suggest changes to your treatment plan to lower the risk of side effects and to make sure your medications work as intended. This list may not describe all possible interactions. Give your health care provider a list of all the medicines, herbs, non-prescription drugs, or dietary supplements you use. Also tell them if you smoke, drink alcohol, or use illegal drugs. Some items may interact with your medicine. What should I watch for while using this medication? Visit your care team for regular checks on your progress. Check your blood pressure as directed. Know what your blood pressure should be and when to contact your care team. This medication may affect your coordination, reaction time, or judgment. Do not drive or operate machinery until you  know how this medication affects you. Sit up or stand slowly to reduce the risk of dizzy or fainting spells. Drinking alcohol with this medication can increase the risk of these side effects. Do not suddenly stop taking this medication. This may increase your risk of  side effects, such as chest pain and heart attack. If you no longer need to take this medication, your care team will lower the dose slowly over time to decrease the risk of side effects. If you are going to need surgery or a procedure, tell your care team that you are using this medication. This medication may affect blood glucose levels. It can also mask the symptoms of low blood sugar, such as a rapid heartbeat and tremors. If you have diabetes, it is important to check your blood sugar often while you are taking this medication. Do not treat yourself for coughs, colds, or pain while you are using this medication without asking your care team for advice. Some medications may increase your blood pressure. What side effects may I notice from receiving this medication? Side effects that you should report to your care team as soon as possible: Allergic reactions--skin rash, itching, hives, swelling of the face, lips, tongue, or throat Heart failure--shortness of breath, swelling of the ankles, feet, or hands, sudden weight gain, unusual weakness or fatigue Low blood pressure--dizziness, feeling faint or lightheaded, blurry vision Raynaud's--cool, numb, or painful fingers or toes that may change color from pale, to blue, to red Redness, blistering, peeling, or loosening of the skin, including inside the mouth Slow heartbeat--dizziness, feeling faint or lightheaded, confusion, trouble breathing, unusual weakness or fatigue Worsening mood, feelings of depression Side effects that usually do not require medical attention (report to your care team if they continue or are bothersome): Change in sex drive or performance Diarrhea Dizziness Fatigue Headache This list may not describe all possible side effects. Call your doctor for medical advice about side effects. You may report side effects to FDA at 1-800-FDA-1088. Where should I keep my medication? Keep out of the reach of children and pets. Store at  room temperature between 15 and 30 degrees C (59 and 86 degrees F). Protect from light and moisture. Keep the container tightly closed. Avoid exposure to extreme heat. Do not freeze. Throw away any unused medication after the expiration date. NOTE: This sheet is a summary. It may not cover all possible information. If you have questions about this medicine, talk to your doctor, pharmacist, or health care provider.  2024 Elsevier/Gold Standard (2022-01-02 00:00:00)

## 2022-07-06 NOTE — Progress Notes (Signed)
Follow up   Assessment and Plan  Diagnoses and all orders for this visit:  Essential hypertension Controlled  Discussed DASH (Dietary Approaches to Stop Hypertension) DASH diet is lower in sodium than a typical American diet. Cut back on foods that are high in saturated fat, cholesterol, and trans fats. Eat more whole-grain foods, fish, poultry, and nuts Remain active and exercise as tolerated daily.  Monitor BP at home-Call if greater than 130/80.  Check CMP/CBC  Obesity hypoventilation syndrome Continue CPAP, weight loss encouraged  Type 2 diabetes mellitus with CKD [facility]) Education: Reviewed 'ABCs' of diabetes management  Discussed goals to be met and/or maintained include A1C (<7) Blood pressure (<130/80) Cholesterol (LDL <70) Continue Eye Exam yearly  Continue Dental Exam Q6 mo Discussed dietary recommendations Discussed Physical Activity recommendations Check A1C  CKD II associated with T2DM (HCC) Discussed how what you eat and drink can aide in kidney protection. Stay well hydrated. Avoid high salt foods. Avoid NSAIDS. Keep BP and BG well controlled.   Take medications as prescribed. Remain active and exercise as tolerated daily. Maintain weight.  Continue to monitor. Check CMP/GFR/Microablumin  Hyperlipidemia associated with T2DM (HCC) Discussed lifestyle modifications. Recommended diet heavy in fruits and veggies, omega 3's. Decrease consumption of animal meats, cheeses, and dairy products. Remain active and exercise as tolerated. Continue to monitor. Check lipids/TSH   DM type 2 with diabetic peripheral neuropathy (HCC)/ Atherosclerotic PVD(HCC) Check feet at home daily, call with changes/concerns Followed closely by podiatry q70m Maintain adequate glucose control  Primary osteoarthritis of left knee Followed by ortho Continue straight cane for support  Continue to monitor  RLS Continue meds, increase walking, monitor iron/B12 Stay well  hydrated  Type 2 Diabetes Mellitus with Morbid obesity (BMI 41+) Discussed appropriate BMI Diet modification. Physical activity. Encouraged/praised to build confidence.  Medication management All medications discussed and reviewed in full. All questions and concerns regarding medications addressed.    Idiopathic chronic gout without tophus, unspecified site Continue allopurinol Low purine diet discussed Check uric acid as needed  Hx of prostate cancer S/p robotic excision, followed by Dr. Marlou Porch, Flomax for residual LUTS  Gait instability R/t neuropathy; denies falls;  Continue cane for support Change positions slowly  Vitamin D deficiency Continue supplement for goal of 60-100 Monitor Vitamin D levels  Tremor Continue Sinemet Start Propranolol 40 mg Monitor BP  Seasonal allergies Monitor allergy index daily Take antihistamine Allergra daily and other medications, Flonase, Mucinex, PRN Continue to monitor   Orders Placed This Encounter  Procedures   CBC with Differential/Platelet   COMPLETE METABOLIC PANEL WITH GFR   Lipid panel   Hemoglobin A1c   VITAMIN D 25 Hydroxy (Vit-D Deficiency, Fractures)   Meds ordered this encounter  Medications   propranolol (INDERAL) 40 MG tablet    Sig: Take 1 tablet (40 mg total) by mouth daily.    Dispense:  30 tablet    Refill:  0    Order Specific Question:   Supervising Provider    Answer:   Lucky Cowboy [phone]   Notify office for further evaluation and treatment, questions or concerns if any reported s/s fail to improve.   The patient was advised to call back or seek an in-person evaluation if any symptoms worsen or if the condition fails to improve as anticipated.   Further disposition pending results of labs. Discussed med's effects and SE's.    I discussed the assessment and treatment plan with the patient. The patient was provided an opportunity to  ask questions and all were answered. The patient agreed with  the plan and demonstrated an understanding of the instructions.  Discussed med's effects and SE's. Screening labs and tests as requested with regular follow-up as recommended.  I provided 25 minutes of face-to-face time during this encounter including counseling, chart review, and critical decision making was preformed.  Today's Plan of Care is based on a patient-centered health care approach known as shared decision making - the decisions, tests and treatments allow for patient preferences and values to be balanced with clinical evidence.    Future Appointments  Date Time Provider Department Center  09/05/2022  8:45 AM [employer] [place], [place] [facility]  09/19/2022  8:00 AM Ronney Asters, NP [facility] None  09/29/2022  7:45 AM [facility]LEBOT[facility] None  09/29/2022  8:00 AM Ladene Artist, MD [facility] None  10/06/2022  9:30 AM Raynelle Dick, NP [facility] None  12/19/2022 10:00 AM Raynelle Dick, NP [facility] None  03/20/2023 10:00 AM Raynelle Dick, NP [facility] None     Plan:   During the course of the visit the patient was educated and counseled about appropriate screening and preventive services including:   Pneumococcal vaccine  Prevnar 13 Influenza vaccine Td vaccine Screening electrocardiogram Bone densitometry screening Colorectal cancer screening Diabetes screening Glaucoma screening Nutrition counseling  Advanced directives: requested    HPI BP 110/70   Pulse 72   Temp (!) 96.8 F (36 C)   Ht 6\' 1"  (1.854 m)   Wt 278 lb 12.8 oz (126.5 kg)   SpO2 98%   BMI 36.78 kg/m   67 y.o. male  presents a general follow with hypertension, hyperlipidemia, diabetes and vitamin D deficiency. He has CKD stage 2 due to type 2 diabetes mellitus (HCC); Morbid obesity (HCC); Obesity hypoventilation syndrome (HCC); T2_NIDDM w/ CKD 2 ; Gout; Hyperlipidemia associated with type 2 diabetes mellitus (HCC); Hypertension; RLS; Medication management; DM type 2  with diabetic peripheral neuropathy (HCC); Normal coronary arteries; Primary osteoarthritis of left knee; Gait instability; Bilateral carpal tunnel syndrome; Heart rate fast; History of prostate cancer; Iron deficiency anemia; Cubital tunnel syndrome; Obesity with alveolar hypoventilation, unspecified obesity severity (HCC); CPAP (continuous positive airway pressure) dependence; Other insomnia; S/P UPPP (uvulopalatopharyngoplasty); Shortness of breath; OSA on CPAP; Severe obstructive sleep apnea; and Atherosclerotic peripheral vascular disease (HCC) on their problem list.  He is married, 2 sons, no grandchildren. Enjoys his dog. He is semi retired Optician, dispensing. His wife died this past year. Son accompanies him to visits.  Overall he reports feeling well and stable.  He does note increase in tremors, BLE most noticeable.  NO pain.  He currently takes sinemet BID for RLS. Also takes gabapentin 600 mg TID PRN and has been taking for many years. He reports a hx of RLS however, notes the shakiness and moving of his lower legs happens intermittently.  Pain does not wake him from sleep.  He has imbalance attributed to advanced peripheral neuropathy, no falls this year, does ambulate without cane if needed. Son drives him when needed. He follows with Dr. Salvatore Decent for back pain. Follows with podiatry Dr. Al Corpus for diabetic neuropathy.  He notes increase in watery eyes and nasal congestion then feels dried out.  Has been using Allegra, Flonase, Mucinex DM, eye gtts.    He has been having shoulder and arm pain, has good range of motion. He does have very high steps to get into his townhouse and requires a ramp to get  into his house.   He is on CPAP for OSA/obesity hypoventilation syndrome and endorses 100% compliance and restorative sleep.   He is taking flomax in the AM per urology for sense of incomplete bladder emptying; hx of prostate cancer and robotic excision in 2014 and followed by Dr. Marlou [facility]ch at [facility]  Urology. PSAs have remained <0.1.   He has a diagnosis of GERD which is currently well managed by pantoprazole 40 mg  BMI is Body mass index is 36.78 kg/m., he has not been working on diet and exercise Wt Readings from Last 3 Encounters:  07/06/22 278 lb 12.8 oz (126.5 kg)  05/09/22 280 lb 9.6 oz (127.3 kg)  04/24/22 269 lb 3.2 oz (122.1 kg)   His blood pressure has been controlled at home, today their BP is BP: 110/70  BP Readings from Last 3 Encounters:  07/06/22 110/70  05/09/22 138/80  04/24/22 (!) 88/52   He does workout, walks daily with dog. He denies chest pain, shortness of breath, dizziness.   He is on cholesterol medication, on Atorvastatin 20 mg every other day denies myalgias. His cholesterol is not at goal. The cholesterol last visit was:   Lab Results  Component Value Date   CHOL 91 03/22/2022   HDL 31 (L) 03/22/2022   LDLCALC 34 03/22/2022   TRIG 193 (H) 03/22/2022   CHOLHDL 2.9 03/22/2022    He has been working on diet  for Type 2 diabetes Neuropathy follows with podiatry, on 600 mg BID Diabetic eye -  Dr. Harriette Bouillon 12/2021 He has been checking fasting glucose, log demonstrates improved from 120-150 Last A1C in the office was:  Lab Results  Component Value Date   HGBA1C 6.7 (H) 03/22/2022   He has CKD II associated with T2DM monitored at this office. On enalapril. Last GFR:  Lab Results  Component Value Date   EGFR 97 03/22/2022   Patient is on Vitamin D supplement, taking 16109 IU every other day    Lab Results  Component Value Date   VD25OH 105 (H) 12/16/2021     Patient is on allopurinol for gout and does not report a recent flare.  Lab Results  Component Value Date   LABURIC 5.2 09/06/2021      Current Outpatient Medications  Medication Instructions   Accu-Chek Softclix Lancets lancets USE WITH METER TO CHECK  BLOOD SUGAR ONCE DAILY   acyclovir (ZOVIRAX) 400 mg, Oral, Daily   allopurinol (ZYLOPRIM) 300 MG tablet TAKE 1 TABLET BY MOUTH   DAILY FOR GOUT PREVENTION   aspirin 81 mg, Oral, Daily   atorvastatin (LIPITOR) 20 mg, Oral, Daily   bisoprolol (ZEBETA) 5 mg, Oral, Daily   Blood Glucose Monitoring Suppl (ONE TOUCH ULTRA 2) w/Device KIT USE AS DIRECTED   carbidopa-levodopa (SINEMET IR) 25-250 MG tablet TAKE 1 TABLET BY MOUTH  TWICE DAILY FOR RESTLESS  LEGS   Cyanocobalamin (B-12) 2500 MCG SUBL Sublingual, Every other day   dextromethorphan-guaiFENesin (MUCINEX DM) 30-600 MG 12hr tablet 1 tablet, Oral, 2 times daily PRN   DULoxetine (CYMBALTA) 60 mg, Oral, Daily at bedtime   ferrous sulfate 325 mg, Oral, Daily with breakfast   fexofenadine (ALLEGRA) 180 mg, Oral, Daily PRN   fluticasone (FLONASE) 50 MCG/ACT nasal spray 2 sprays each nostril once a day   gabapentin (NEURONTIN) 600 mg, Oral, 2 times daily   Magnesium 800 mg, Oral, Nightly   meloxicam (MOBIC) 7.5 MG tablet Take 1 tablet Daily  with Food  is needed for Pain  metFORMIN (GLUCOPHAGE-XR) 500 MG 24 hr tablet TAKE 2 TABLETS BY MOUTH  TWICE DAILY WITH MEALS FOR  DIABETES   methocarbamol (ROBAXIN) 500 mg, Oral, 3 times daily PRN, PRN   Multiple Vitamin (MULTIVITAMIN) tablet 1 tablet, Oral, Daily   olmesartan (BENICAR) 40 MG tablet TAKE 1 TABLET BY MOUTH  EVERY NIGHT FOR BLOOD  PRESSURE AND DIABETIC  KIDNEY PROTECTION   ONETOUCH ULTRA test strip USE TO CHECK BLOOD SUGAR DAILY   pantoprazole (PROTONIX) 40 MG tablet TAKE 1 TABLET DAILY TO  PREVENT HEARTBURN &amp;  INDIGESTION   tamsulosin (FLOMAX) 0.4 MG CAPS capsule TAKE 2 CAPSULES BY MOUTH AT  BEDTIME FOR PROSTATE   TRULICITY 4.5 MG/0.5ML SOPN Inject 1 pen  (4.5 mg) into Skin every 7 days   Vitamin D, Ergocalciferol, (DRISDOL) 1.25 MG (50000 UNIT) CAPS capsule TAKE 1 CAPSULE BY MOUTH 2 DAYS  WEEKLY FOR VITAMIN D DEFICIENCY     Medical History:  Past Medical History:  Diagnosis Date   Acute meniscal tear of left knee    Allergic rhinitis    Arthritis     back   Borderline glaucoma    Chronic back pain     stenosis   CKD (chronic kidney disease), stage II    Complication of anesthesia cervical fusion surgery 06/2010   "woke up next day w/ventilator on" told due to OSA   GERD (gastroesophageal reflux disease)    takes Prevacid daily   Gout    takes Allopurinol daily and Colchicine if needed   History of adenomatous polyp of colon    tubular adenoma's   History of Barrett's esophagus    History of hiatal hernia    Hyperlipidemia    Hypertension    Incomplete right bundle branch block (RBBB)    OA (osteoarthritis)    knee   OSA on CPAP    Peripheral neuropathy    Prostate cancer [facility]) urologist-  dr Laverle Patter   dx 2013  s/p  radical non-nerve sparing prostatectomy 2014--  Stage pT2cNx,  PSA 3.48,  Gleason 3+3,  vol 55cc/  current PSA  0.01 (Aug 2016)   Rupture of Achilles tendon    Thyroid nodule    left side   Type II diabetes mellitus (HCC)    Urinary frequency    takes Flomax daily   Urine incontinence    Wears glasses    Allergies Allergies  Allergen Reactions   Fenofibrate Itching and Rash   Flaxseed Oil Rash   Flaxseed [Flaxseed (Linseed)] Rash   Lyrica [Pregabalin] Itching and Rash   Niaspan [Niacin Er] Rash   Preventative Medicine Immunization History  Administered Date(s) Administered   Influenza Inj Mdck Quad With Preservative 11/22/2016, 12/04/2017   Influenza Split 10/17/2013, 10/29/2014   Influenza, High Dose Seasonal PF 11/26/2018, 11/27/2019, 11/29/2020, 12/06/2021   Influenza, Seasonal, Injecte, Preservative Fre 11/29/2015   Influenza-Unspecified 11/04/2012   PFIZER(Purple Top)SARS-COV-2 Vaccination 03/10/2019, 03/31/2019, 11/06/2019   PPD Test 01/16/2006, 04/07/2013   Pfizer Covid-19 Vaccine Bivalent Booster 55yrs & up 02/16/2021   Pneumococcal Conjugate-13 07/29/2018   Pneumococcal Polysaccharide-23 04/07/2013, 11/22/2016   Pneumococcal-Unspecified 09/17/2010   Td 01/17/2011, 03/10/2021   Td (Adult), 2 Lf Tetanus Toxid, Preservative Free 01/17/2011    Zoster Recombinat (Shingrix) 02/16/2021   Zoster, Live 01/16/2005   Health Maintenance  Topic Date Due   Zoster Vaccines- Shingrix (2 of 2) 04/13/2021   COVID-19 Vaccine (5 - 2023-24 season) 09/16/2021   INFLUENZA VACCINE  08/17/2022   HEMOGLOBIN A1C  09/22/2022  Diabetic kidney evaluation - Urine ACR  12/17/2022   FOOT EXAM  12/17/2022   OPHTHALMOLOGY EXAM  12/30/2022   Diabetic kidney evaluation - eGFR measurement  03/22/2023   Medicare Annual Wellness (AWV)  03/22/2023   Pneumonia Vaccine 67+ Years old (3 of 3 - PPSV23 or PCV20) 07/29/2023   Colonoscopy  11/24/2026   DTaP/Tdap/Td (3 - Tdap) 03/11/2031   Hepatitis C Screening  Completed   HPV VACCINES  Aged Out   Patient Care Team: Lucky Cowboy, MD as PCP - General (Internal Medicine) Runell Gess, MD as PCP - Cardiology (Cardiology) Elinor Parkinson, DPM as Consulting Physician (Podiatry) Glenford Peers, OD as Referring Physician (Optometry) [place], Peter M, MD as Consulting Physician (Cardiology) Bernette Redbird, MD as Consulting Physician (Gastroenterology) Jene Every, MD as Consulting Physician (Orthopedic Surgery) Crist Fat, MD as Attending Physician (Urology) Sundra Aland, [facility] as Pharmacist (Pharmacist)   SURGICAL HISTORY He  has a past surgical history that includes Posterior fusion cervical spine (07-05-2010); Esophagogastroduodenoscopy (09/26/2011); Robot assisted laparoscopic radical prostatectomy (02/07/2012); wisdom teeth extracted  (1982); left and right heart catheterization with coronary angiogram (N/A, 08/25/2011); Anal fissure repair (06-05-2002); Cardiovascular stress test (01-01-2008); transthoracic echocardiogram (08-03-2011); Posterior lumbar fusion (05-18-2014); Uvulopalatopharyngoplasty (uppp)/tonsillectomy/septoplasty (1990's); Knee arthroscopy with medial menisectomy (Left, 12/31/2014); Cardiac catheterization; Back surgery; Total knee arthroplasty (Left, 08/05/2015); Gastroc recession  extremity (Left, 06/27/2017); Achilles tendon surgery (Left, 06/27/2017); Tendon transfer (Left, 06/27/2017); Carpal tunnel release (Left, 03/27/2018); and Carpal tunnel release (Right, 06/2018). FAMILY HISTORY His family history includes Cancer in his father; Dementia in his mother; Heart attack in his maternal grandmother; Hyperlipidemia in his sister; Hypertension in his mother, paternal grandfather, and sister; Osteoarthritis in his mother; Osteoporosis in his mother; Sleep apnea in his son and son; Stroke in his maternal grandmother. SOCIAL HISTORY He  reports that he has never smoked. He has never used smokeless tobacco. He reports that he does not drink alcohol and does not use drugs.  Review of Systems:  Review of Systems  Constitutional:  Negative for chills, diaphoresis, fever, malaise/fatigue and weight loss.  HENT:  Negative for congestion, ear pain, hearing loss, sore throat and tinnitus.   Eyes: Negative.  Negative for blurred vision and double vision.  Respiratory:  Negative for cough, sputum production, shortness of breath and wheezing.   Cardiovascular:  Negative for chest pain, palpitations, orthopnea, claudication, leg swelling and PND.  Gastrointestinal:  Negative for abdominal pain, blood in stool, constipation, diarrhea, heartburn, melena, nausea and vomiting.  Genitourinary: Negative.   Musculoskeletal:  Positive for back pain. Negative for falls, joint pain and myalgias.  Skin: Negative.  Negative for rash.  Neurological:  Positive for tingling (bil fingers) and sensory change (chronic numbness of bil feet). Negative for dizziness, loss of consciousness, weakness and headaches.  Endo/Heme/Allergies:  Negative for polydipsia. Bruises/bleeds easily.  Psychiatric/Behavioral: Negative.  Negative for depression, memory loss, substance abuse and suicidal ideas. The patient is not nervous/anxious and does not have insomnia.   All other systems reviewed and are  negative.   Physical Exam: BP 110/70   Pulse 72   Temp (!) 96.8 F (36 C)   Ht 6\' 1"  (1.854 m)   Wt 278 lb 12.8 oz (126.5 kg)   SpO2 98%   BMI 36.78 kg/m  Wt Readings from Last 3 Encounters:  07/06/22 278 lb 12.8 oz (126.5 kg)  05/09/22 280 lb 9.6 oz (127.3 kg)  04/24/22 269 lb 3.2 oz (122.1 kg)   General Appearance: Well nourished  well developed, non-toxic appearing, in no apparent distress. Eyes: PERRLA, EOMs, conjunctiva no swelling or erythema ENT/Mouth: Ear canals clear with no erythema, swelling, or discharge.  TMs normal bilaterally, oropharynx clear, moist, with no exudate.   Neck: Supple, thyroid normal, no JVD, no cervical adenopathy.  Respiratory: Respiratory effort normal, breath sounds clear A&P, no wheeze, rhonchi or rales noted.  No retractions, no accessory muscle usage Cardio: RRR with no MRGs. No edema noted. Intact pedal and post tibial pulses, arm feet.  Abdomen: Soft, + BS, morbidly obese abdomen limiting exam.  Non tender, no guarding, rebound, hernias, he has mild ventral hernia with bearing down. Musculoskeletal: Full ROM, 5/5 strength, antalgic gait Skin: Warm, dry without rashes, many ecchymoses of arms. Calluses to bilateral feet without ulcers or wounds. Neuro: Awake and oriented X 3, Cranial nerves intact. No cerebellar symptoms. He has 0/10 to monofilament in bilateral feet.  Psych: normal affect, Insight and Judgment appropriate.   Adela Glimpse, NP 9:43 AM [facility]

## 2022-07-07 ENCOUNTER — Other Ambulatory Visit: Payer: Self-pay

## 2022-07-07 LAB — COMPLETE METABOLIC PANEL WITH GFR
AG Ratio: 2.2 (calc) (ref 1.0–2.5)
ALT: 8 U/L — ABNORMAL LOW (ref 9–46)
AST: 13 U/L (ref 10–35)
Albumin: 4.1 g/dL (ref 3.6–5.1)
Alkaline phosphatase (APISO): 60 U/L (ref 35–144)
BUN: 12 mg/dL (ref 7–25)
CO2: 24 mmol/L (ref 20–32)
Calcium: 9.5 mg/dL (ref 8.6–10.3)
Chloride: 104 mmol/L (ref 98–110)
Creat: 0.81 mg/dL (ref 0.70–1.35)
Globulin: 1.9 g/dL (calc) (ref 1.9–3.7)
Glucose, Bld: 190 mg/dL — ABNORMAL HIGH (ref 65–99)
Potassium: 4.6 mmol/L (ref 3.5–5.3)
Sodium: 138 mmol/L (ref 135–146)
Total Bilirubin: 0.6 mg/dL (ref 0.2–1.2)
Total Protein: 6 g/dL — ABNORMAL LOW (ref 6.1–8.1)
eGFR: 95 mL/min/{1.73_m2} (ref >=60)

## 2022-07-07 LAB — HEMOGLOBIN A1C
Hgb A1c MFr Bld: 6.8 % of total Hgb — ABNORMAL HIGH (ref <5.7)
Mean Plasma Glucose: 148 mg/dL
eAG (mmol/L): 8.2 mmol/L

## 2022-07-07 LAB — LIPID PANEL
Cholesterol: 85 mg/dL (ref <200)
HDL: 32 mg/dL — ABNORMAL LOW (ref >=40)
LDL Cholesterol (Calc): 26 mg/dL (calc)
Non-HDL Cholesterol (Calc): 53 mg/dL (calc) (ref <130)
Total CHOL/HDL Ratio: 2.7 (calc) (ref <5.0)
Triglycerides: 204 mg/dL — ABNORMAL HIGH (ref <150)

## 2022-07-07 LAB — CBC WITH DIFFERENTIAL/PLATELET
Absolute Monocytes: 422 cells/uL (ref 200–950)
Basophils Absolute: 47 cells/uL (ref 0–200)
Basophils Relative: 0.7 %
Eosinophils Absolute: 80 cells/uL (ref 15–500)
Eosinophils Relative: 1.2 %
HCT: 38.7 % (ref 38.5–50.0)
Hemoglobin: 13.2 g/dL (ref 13.2–17.1)
Lymphs Abs: 958 cells/uL (ref 850–3900)
MCH: 32.4 pg (ref 27.0–33.0)
MCHC: 34.1 g/dL (ref 32.0–36.0)
MCV: 95.1 fL (ref 80.0–100.0)
MPV: 10.1 fL (ref 7.5–12.5)
Monocytes Relative: 6.3 %
Neutro Abs: 5193 cells/uL (ref 1500–7800)
Neutrophils Relative %: 77.5 %
Platelets: 245 10*3/uL (ref 140–400)
RBC: 4.07 10*6/uL — ABNORMAL LOW (ref 4.20–5.80)
RDW: 13.4 % (ref 11.0–15.0)
Total Lymphocyte: 14.3 %
WBC: 6.7 10*3/uL (ref 3.8–10.8)

## 2022-07-07 LAB — VITAMIN D 25 HYDROXY (VIT D DEFICIENCY, FRACTURES): Vit D, 25-Hydroxy: 115 ng/mL — ABNORMAL HIGH (ref 30–100)

## 2022-07-07 MED ORDER — PANTOPRAZOLE SODIUM 40 MG PO TBEC: DELAYED_RELEASE_TABLET | ORAL | 3 refills | Status: DC

## 2022-07-09 DIAGNOSIS — G4733 Obstructive sleep apnea (adult) (pediatric): Secondary | ICD-10-CM | POA: Diagnosis not present

## 2022-07-11 ENCOUNTER — Other Ambulatory Visit: Payer: Self-pay | Admitting: Nurse Practitioner

## 2022-07-18 ENCOUNTER — Telehealth: Payer: Self-pay | Admitting: Nurse Practitioner

## 2022-07-18 NOTE — Telephone Encounter (Signed)
Pt said [facility] told him that they cannot send the medicine because they are missing something from the provider?

## 2022-07-26 DIAGNOSIS — M5412 Radiculopathy, cervical region: Secondary | ICD-10-CM | POA: Diagnosis not present

## 2022-07-26 DIAGNOSIS — M4322 Fusion of spine, cervical region: Secondary | ICD-10-CM | POA: Diagnosis not present

## 2022-07-26 DIAGNOSIS — M4326 Fusion of spine, lumbar region: Secondary | ICD-10-CM | POA: Diagnosis not present

## 2022-08-06 ENCOUNTER — Other Ambulatory Visit: Payer: Self-pay | Admitting: Nurse Practitioner

## 2022-08-06 DIAGNOSIS — R251 Tremor, unspecified: Secondary | ICD-10-CM

## 2022-08-08 DIAGNOSIS — H52223 Regular astigmatism, bilateral: Secondary | ICD-10-CM | POA: Diagnosis not present

## 2022-08-08 DIAGNOSIS — E119 Type 2 diabetes mellitus without complications: Secondary | ICD-10-CM | POA: Diagnosis not present

## 2022-08-08 DIAGNOSIS — H5203 Hypermetropia, bilateral: Secondary | ICD-10-CM | POA: Diagnosis not present

## 2022-08-08 DIAGNOSIS — H04123 Dry eye syndrome of bilateral lacrimal glands: Secondary | ICD-10-CM | POA: Diagnosis not present

## 2022-08-08 DIAGNOSIS — H524 Presbyopia: Secondary | ICD-10-CM | POA: Diagnosis not present

## 2022-08-08 DIAGNOSIS — Z961 Presence of intraocular lens: Secondary | ICD-10-CM | POA: Diagnosis not present

## 2022-08-09 ENCOUNTER — Other Ambulatory Visit: Payer: Self-pay

## 2022-08-09 DIAGNOSIS — R251 Tremor, unspecified: Secondary | ICD-10-CM

## 2022-08-09 MED ORDER — PROPRANOLOL HCL 40 MG PO TABS
40.0000 mg | ORAL_TABLET | Freq: Every day | ORAL | 3 refills | Status: DC
Start: 2022-08-09 — End: 2022-10-30

## 2022-08-15 ENCOUNTER — Other Ambulatory Visit: Payer: Self-pay | Admitting: Nurse Practitioner

## 2022-08-15 DIAGNOSIS — E1122 Type 2 diabetes mellitus with diabetic chronic kidney disease: Secondary | ICD-10-CM

## 2022-09-05 ENCOUNTER — Encounter: Payer: Self-pay | Admitting: Podiatry

## 2022-09-05 ENCOUNTER — Ambulatory Visit: Payer: Medicare Other | Admitting: Podiatry

## 2022-09-05 DIAGNOSIS — B351 Tinea unguium: Secondary | ICD-10-CM | POA: Diagnosis not present

## 2022-09-05 DIAGNOSIS — D2371 Other benign neoplasm of skin of right lower limb, including hip: Secondary | ICD-10-CM | POA: Diagnosis not present

## 2022-09-05 DIAGNOSIS — D2372 Other benign neoplasm of skin of left lower limb, including hip: Secondary | ICD-10-CM

## 2022-09-05 DIAGNOSIS — M79676 Pain in unspecified toe(s): Secondary | ICD-10-CM | POA: Diagnosis not present

## 2022-09-05 DIAGNOSIS — E1142 Type 2 diabetes mellitus with diabetic polyneuropathy: Secondary | ICD-10-CM

## 2022-09-05 NOTE — Progress Notes (Signed)
He presents today chief complaint of painful mycotic nails and numbness and tingling in his feet.  States that he is going to another doctor for his neuropathy in the near future.  States that he went for his's evaluation and was told that he had severe sensory neuropathy.  Objective: Vital signs are stable he is alert and oriented x 3.  Pulses are palpable.  He has benign skin lesions plantar aspect of the forefoot no open lesions or wounds are noted.  Moderate to severe neuropathy on palpation and on Semmes Weinstein's monofilament test.  Toenails are long thick yellow dystrophic clinically mycotic and painful on palpation as well as debridement.  Assessment: Pain limb secondary to diabetic peripheral neuropathy onychomycosis and benign skin lesions.  Plan: Debrided all benign skin lesions today with a 15 blade no iatrogenic lesions were noted.  Debrided toenails 1 through 5 bilaterally and smooth.  I will follow-up with him in 3 months.  Remember to ask how his neuropathy appointment went.

## 2022-09-13 NOTE — Progress Notes (Signed)
Cardiology Clinic Note   Patient Name: Jason Wyatt Date of Encounter: 09/19/2022  Primary Care Provider:  Lucky Cowboy, MD Primary Cardiologist:  Nanetta Batty, MD  Patient Profile    Jason Wyatt 67 year old male presents the clinic today for follow-up evaluation of his hypertension, coronary artery disease, and DOE.  Past Medical History    Past Medical History:  Diagnosis Date   Acute meniscal tear of left knee    Allergic rhinitis    Arthritis     back   Borderline glaucoma    Chronic back pain    stenosis   CKD (chronic kidney disease), stage II    Complication of anesthesia cervical fusion surgery 06/2010   "woke up next day w/ventilator on" told due to OSA   GERD (gastroesophageal reflux disease)    takes Prevacid daily   Gout    takes Allopurinol daily and Colchicine if needed   History of adenomatous polyp of colon    tubular adenoma's   History of Barrett's esophagus    History of hiatal hernia    Hyperlipidemia    Hypertension    Incomplete right bundle branch block (RBBB)    OA (osteoarthritis)    knee   OSA on CPAP    Peripheral neuropathy    Prostate cancer [facility]) urologist-  dr Laverle Patter   dx 2013  s/p  radical non-nerve sparing prostatectomy 2014--  Stage pT2cNx,  PSA 3.48,  Gleason 3+3,  vol 55cc/  current PSA  0.01 (Aug 2016)   Rupture of Achilles tendon    Thyroid nodule    left side   Type II diabetes mellitus (HCC)    Urinary frequency    takes Flomax daily   Urine incontinence    Wears glasses    Past Surgical History:  Procedure Laterality Date   ACHILLES TENDON SURGERY Left 06/27/2017   Procedure: ACHILLES TENDON REPAIR;  Surgeon: Park Liter, DPM;  Location: [facility];  Service: Podiatry;  Laterality: Left;   ANAL FISSURE REPAIR  06-05-2002   BACK SURGERY     CARDIAC CATHETERIZATION     CARDIOVASCULAR STRESS TEST  01-01-2008   normal nuclear study/  no ischemia/  normal LV function and wall motion , 57%   CARPAL TUNNEL  RELEASE Left 03/27/2018   CARPAL TUNNEL RELEASE Right 06/2018   ESOPHAGOGASTRODUODENOSCOPY  09/26/2011   Procedure: ESOPHAGOGASTRODUODENOSCOPY (EGD);  Surgeon: Florencia Reasons, MD;  Location: [facility];  Service: Endoscopy;  Laterality: N/A;   GASTROC RECESSION EXTREMITY Left 06/27/2017   Procedure: GASTROC RECESSION EXTREMITY;  Surgeon: Park Liter, DPM;  Location: [facility];  Service: Podiatry;  Laterality: Left;   KNEE ARTHROSCOPY WITH MEDIAL MENISECTOMY Left 12/31/2014   Procedure: LEFT KNEE ARTHROSCOPY, PARTIAL MEDIAL AND LATERAL  MENISECTOMIES WITH DEDRIDEMENT;  Surgeon: Jene Every, MD;  Location: [facility] [place];  Service: Orthopedics;  Laterality: Left;   LEFT AND RIGHT HEART CATHETERIZATION WITH CORONARY ANGIOGRAM N/A 08/25/2011   Procedure: LEFT AND RIGHT HEART CATHETERIZATION WITH CORONARY ANGIOGRAM;  Surgeon: Peter M Swaziland, MD;  Location: [facility] CATH LAB;  Service: Cardiovascular;  Laterality: N/A;   Normal coronary arteries and LVF, ef 55-60%   POSTERIOR FUSION CERVICAL SPINE  07-05-2010   C1 --  C4   POSTERIOR LUMBAR FUSION  05-18-2014   L4 -- S1   ROBOT ASSISTED LAPAROSCOPIC RADICAL PROSTATECTOMY  02/07/2012   Procedure: ROBOTIC ASSISTED LAPAROSCOPIC RADICAL PROSTATECTOMY LEVEL 1;  Surgeon: Milford Cage, MD;  Location:  [facility];  Service: Urology;  Laterality: N/A;      TENDON TRANSFER Left 06/27/2017   Procedure: TENDON TRANSFER;  Surgeon: Park Liter, DPM;  Location: [facility];  Service: Podiatry;  Laterality: Left;   TOTAL KNEE ARTHROPLASTY Left 08/05/2015   Procedure: LEFT TOTAL KNEE ARTHROPLASTY;  Surgeon: Jene Every, MD;  Location: [facility];  Service: Orthopedics;  Laterality: Left;   TRANSTHORACIC ECHOCARDIOGRAM  08-03-2011   mild LVH, ef 55-60%/  trivial TR and PR   UVULOPALATOPHARYNGOPLASTY (UPPP)/TONSILLECTOMY/SEPTOPLASTY  1990's   and Adenoidectomy   wisdom teeth extracted   1982    Allergies  Allergies  Allergen Reactions   Fenofibrate  Itching and Rash   Flaxseed Oil Rash   Flaxseed [Flaxseed (Linseed)] Rash   Lyrica [Pregabalin] Itching and Rash   Niaspan [Niacin Er] Rash    History of Present Illness    Jason Wyatt has a PMH of hypertension, peripheral vascular disease, OSA on CPAP, type 2 diabetes, hyperlipidemia, RLS, gait instability, insomnia, shortness of breath, and obesity.  He underwent cardiac catheterization in 2013 which was normal.  His echocardiogram in 2020 showed mild LVH and diastolic dysfunction.  He is followed by Dr. Allyson Sabal.  He was last seen by Dr. Swaziland on 09/09/2021.  During that time he was being evaluated for shortness of breath.  He reported dyspnea and chest tightness with exertion.  His symptoms had been ongoing for more than a year.  He reported having COVID the prior year.  He reported that his symptoms had started before he contracted COVID.  He was unable to do housework without stopping to rest.  He had no lower extremity swelling.  He had lost weight.  He reported rare palpitations.  He was using his CPAP regularly.  He was having increased stress with his wife being on hospice.  A echocardiogram repeated at that time showed an LVEF of 60-65%, G1 DD, mild dilation of aortic root measuring 39 mm and ascending aortic dilation measuring 41 mm.  He had coronary CTA on 09/29/2021.  He was noted to have a coronary calcium score of 55 which places him in 38 percentile for age and sex matched control.  He was noted to have mid stenosis of his LAD 30-49% and medical management was recommended.  He presents to the clinic today for follow-up evaluation and states that he is doing very well. He has no cardiac concerns or complaints today. He stays busy at his house, cleaning and gardening. He regularly goes out to shop and walk, tolerates well. We discussed diet and recommendations of heart healthy diet with low salt intake.   He denies chest pain, shortness of breath, lower extremity edema, fatigue,  palpitations, melena, hematuria, hemoptysis, diaphoresis, weakness, presyncope, syncope, orthopnea, and PND.  Home Medications    Prior to Admission medications   Medication Sig Start Date End Date Taking? Authorizing Provider  acyclovir (ZOVIRAX) 400 MG tablet Take 400 mg by mouth daily. 05/02/22   [provider]  allopurinol (ZYLOPRIM) 300 MG tablet TAKE 1 TABLET BY MOUTH  DAILY FOR GOUT PREVENTION 04/10/22   Adela Glimpse, NP  aspirin 81 MG tablet Take 81 mg by mouth daily.    [provider]  atorvastatin (LIPITOR) 20 MG tablet Take 1 tablet (20 mg total) by mouth daily. 03/22/22   Raynelle Dick, NP  Blood Glucose Monitoring Suppl (ONE TOUCH ULTRA 2) w/Device KIT USE AS DIRECTED 05/02/22   Raynelle Dick, NP  carbidopa-levodopa [facility]  IR) 25-250 MG tablet TAKE 1 TABLET BY MOUTH  TWICE DAILY FOR RESTLESS  LEGS 04/10/22   Cranford, Archie Patten, NP  Cyanocobalamin (B-12) 2500 MCG SUBL Place under the tongue every other day.    [provider]  dextromethorphan-guaiFENesin (MUCINEX DM) 30-600 MG 12hr tablet Take 1 tablet by mouth 2 (two) times daily as needed for cough.    [provider]  DULoxetine (CYMBALTA) 60 MG capsule TAKE 1 CAPSULE BY MOUTH AT  BEDTIME 01/19/22   Raynelle Dick, NP  ferrous sulfate 325 (65 FE) MG tablet Take 325 mg by mouth daily with breakfast.    [provider]  fexofenadine (ALLEGRA) 180 MG tablet Take 180 mg by mouth daily as needed.    [provider]  fluticasone Aleda Grana) 50 MCG/ACT nasal spray 2 sprays each nostril once a day 06/08/22   Raynelle Dick, NP  gabapentin (NEURONTIN) 600 MG tablet Take 1 tablet (600 mg total) by mouth 2 (two) times daily. 05/02/22   Raynelle Dick, NP  Lancets [facility] DELICA PLUS LANCET33G) MISC USE WITH METER TO CHECK BLOOD  SUGAR ONCE DAILY 08/15/22   Raynelle Dick, NP  Magnesium 400 MG CAPS Take 800 mg by mouth at bedtime.    [provider]  meloxicam  (MOBIC) 7.5 MG tablet Take 1 tablet Daily  with Food  is needed for Pain Patient not taking: Reported on 05/09/2022 11/29/20   Raynelle Dick, NP  metFORMIN (GLUCOPHAGE-XR) 500 MG 24 hr tablet TAKE 2 TABLETS BY MOUTH  TWICE DAILY WITH MEALS FOR  DIABETES 03/27/22   Adela Glimpse, NP  methocarbamol (ROBAXIN) 500 MG tablet Take 500 mg by mouth 3 (three) times daily as needed. PRN 01/04/22   [provider]  Multiple Vitamin (MULTIVITAMIN) tablet Take 1 tablet by mouth daily.    [provider]  olmesartan (BENICAR) 40 MG tablet TAKE 1 TABLET BY MOUTH  EVERY NIGHT FOR BLOOD  PRESSURE AND DIABETIC  KIDNEY PROTECTION 01/02/22   Adela Glimpse, NP  ONETOUCH ULTRA test strip USE TO CHECK BLOOD SUGAR DAILY 05/02/22   Raynelle Dick, NP  pantoprazole (PROTONIX) 40 MG tablet TAKE 1 TABLET DAILY TO  PREVENT HEARTBURN &amp;  INDIGESTION 07/07/22   Adela Glimpse, NP  propranolol (INDERAL) 40 MG tablet Take 1 tablet (40 mg total) by mouth daily. 08/09/22   Adela Glimpse, NP  tamsulosin (FLOMAX) 0.4 MG CAPS capsule TAKE 2 CAPSULES BY MOUTH AT  BEDTIME FOR PROSTATE 04/08/22   Raynelle Dick, NP  TRULICITY 4.5 MG/0.5ML SOPN Inject 1 pen  (4.5 mg) into Skin every 7 days 12/06/21   Lucky Cowboy, MD  Vitamin D, Ergocalciferol, (DRISDOL) 1.25 MG (50000 UNIT) CAPS capsule TAKE 1 CAPSULE BY MOUTH 2 DAYS  WEEKLY FOR VITAMIN D DEFICIENCY 05/02/22   Raynelle Dick, NP    Family History    Family History  Problem Relation Age of Onset   Hypertension Mother    Dementia Mother    Osteoporosis Mother    Osteoarthritis Mother    Cancer Father    Hyperlipidemia Sister    Hypertension Sister    Stroke Maternal Grandmother    Heart attack Maternal Grandmother    Hypertension Paternal Grandfather    Sleep apnea Son    Sleep apnea Son    He indicated that his mother is deceased. He indicated that his father is deceased. He indicated that his sister is alive. He indicated that his  maternal grandmother is  deceased. He indicated that his maternal grandfather is deceased. He indicated that his paternal grandmother is deceased. He indicated that his paternal grandfather is deceased.  Social History    Social History   Socioeconomic History   Marital status: Widowed    Spouse name: Not on file   Number of children: 2   Years of education: Not on file   Highest education level: Some college, no degree  Occupational History   Not on file  Tobacco Use   Smoking status: Never   Smokeless tobacco: Never  Vaping Use   Vaping status: Never Used  Substance and Sexual Activity   Alcohol use: Never   Drug use: Never   Sexual activity: Not on file  Other Topics Concern   Not on file  Social History Narrative   Lives at home with wife   Caffeine: never   Social Determinants of Health   Financial Resource Strain: Not on file  Food Insecurity: Not on file  Transportation Needs: Not on file  Physical Activity: Not on file  Stress: Not on file  Social Connections: Not on file  Intimate Partner Violence: Not on file     Review of Systems    General:  No chills, fever, night sweats or weight changes.  Cardiovascular:  No chest pain, dyspnea on exertion, edema, orthopnea, palpitations, paroxysmal nocturnal dyspnea. Dermatological: No rash, lesions/masses Respiratory: No cough, dyspnea Urologic: No hematuria, dysuria Abdominal:   No nausea, vomiting, diarrhea, bright red blood per rectum, melena, or hematemesis Neurologic:  No visual changes, wkns, changes in mental status. All other systems reviewed and are otherwise negative except as noted above.  Physical Exam    VS:  BP 122/74   Pulse 62   Ht 6\' 2"  (1.88 m)   Wt 271 lb (122.9 kg)   SpO2 97%   BMI 34.79 kg/m  , BMI Body mass index is 34.79 kg/m. GEN: Well nourished, well developed, in no acute distress. HEENT: normal. Neck: Supple, no JVD, carotid bruits, or masses. Cardiac: RRR, no murmurs, rubs, or  gallops. No clubbing, cyanosis, edema.  Radials/DP/PT 2+ and equal bilaterally.  Respiratory:  Respirations regular and unlabored, clear to auscultation bilaterally. GI: Soft, nontender, nondistended, BS + x 4. MS: no deformity or atrophy. Skin: warm and dry, no rash. Neuro:  Strength and sensation are intact. Psych: Normal affect.  Accessory Clinical Findings    Recent Labs: 12/16/2021: Magnesium 1.6; TSH 1.55 07/06/2022: ALT 8; BUN 12; Creat 0.81; Hemoglobin 13.2; Platelets 245; Potassium 4.6; Sodium 138   Recent Lipid Panel    Component Value Date/Time   CHOL 85 07/06/2022 1009   TRIG 204 (H) 07/06/2022 1009   HDL 32 (L) 07/06/2022 1009   CHOLHDL 2.7 07/06/2022 1009   VLDL 41 (H) 06/08/2016 0953   LDLCALC 26 07/06/2022 1009         ECG personally reviewed by me today- EKG Interpretation Date/Time:  Tuesday September 19 2022 08:03:17 EDT Ventricular Rate:  62 PR Interval:  244 QRS Duration:  146 QT Interval:  432 QTC Calculation: 438 R Axis:   -70  Text Interpretation: Sinus rhythm with 1st degree A-V block Left axis deviation Right bundle branch block Inferior infarct (cited on or before 03-Aug-2011) Confirmed by Reather Littler [id number]) on 09/19/2022 8:15:52 AM    Echocardiogram 09/21/2021  IMPRESSIONS     1. Left ventricular ejection fraction, by estimation, is 60 to 65%. The  left ventricle has normal function. The left ventricle has no  regional  wall motion abnormalities. There is moderate concentric left ventricular  hypertrophy. Left ventricular  diastolic parameters are consistent with Grade I diastolic dysfunction  (impaired relaxation).   2. Right ventricular systolic function is normal. The right ventricular  size is normal. Tricuspid regurgitation signal is inadequate for assessing  PA pressure.   3. The mitral valve is normal in structure. No evidence of mitral valve  regurgitation. No evidence of mitral stenosis.   4. The aortic valve was not well  visualized. Aortic valve regurgitation  is not visualized. No aortic stenosis is present.   5. Aortic dilatation noted. There is mild dilatation of the aortic root,  measuring 39 mm. There is mild dilatation of the ascending aorta,  measuring 41 mm.   FINDINGS   Left Ventricle: Left ventricular ejection fraction, by estimation, is 60  to 65%. The left ventricle has normal function. The left ventricle has no  regional wall motion abnormalities. Definity contrast agent was given IV  to delineate the left ventricular   endocardial borders. The left ventricular internal cavity size was normal  in size. There is moderate concentric left ventricular hypertrophy. Left  ventricular diastolic parameters are consistent with Grade I diastolic  dysfunction (impaired relaxation).  Normal left ventricular filling pressure.   Right Ventricle: The right ventricular size is normal. No increase in  right ventricular wall thickness. Right ventricular systolic function is  normal. Tricuspid regurgitation signal is inadequate for assessing PA  pressure.   Left Atrium: Left atrial size was normal in size.   Right Atrium: Right atrial size was normal in size.   Pericardium: There is no evidence of pericardial effusion.   Mitral Valve: The mitral valve is normal in structure. No evidence of  mitral valve regurgitation. No evidence of mitral valve stenosis.   Tricuspid Valve: The tricuspid valve is normal in structure. Tricuspid  valve regurgitation is not demonstrated. No evidence of tricuspid  stenosis.   Aortic Valve: The aortic valve was not well visualized. Aortic valve  regurgitation is not visualized. No aortic stenosis is present.   Pulmonic Valve: The pulmonic valve was normal in structure. Pulmonic valve  regurgitation is not visualized. No evidence of pulmonic stenosis.   Aorta: Aortic dilatation noted. There is mild dilatation of the aortic  root, measuring 39 mm. There is mild  dilatation of the ascending aorta,  measuring 41 mm.   Venous: The inferior vena cava was not well visualized.   IAS/Shunts: No atrial level shunt detected by color flow Doppler.    Coronary CTA 09/29/2021  FINDINGS: A 120 kV prospective scan was triggered in the descending thoracic aorta at 111 HU's. Axial non-contrast 3 mm slices were carried out through the heart. The data set was analyzed on a dedicated work station and scored using the Agatson method. Gantry rotation speed was 250 msecs and collimation was .6 mm. 0.8 mg of sl NTG was given. The 3D data set was reconstructed in 5% intervals of the 67-82 % of the R-R cycle. Diastolic phases were analyzed on a dedicated work station using MPR, MIP and VRT modes. The patient received 80 cc of contrast.   Image quality: Good, mild misregistration artifact. Distal vessels are small in caliber and difficult to visualize.   Aorta:  Normal size.  No calcifications.  No dissection.   Aortic Valve: No calcifications.   Coronary Arteries:  Normal coronary origin.  Right dominance.   RCA is a large dominant artery that gives rise to PDA  and PLA. There is no plaque.   Left main is a large artery that gives rise to LAD and Ramus/LCX arteries.   LAD is a large vessel that has focal mid calcified plaque. Mid mild stenosis 30-49%   LCX is a non-dominant artery that gives rise to one large OM1 branch. There is no plaque.   Other findings:   Normal pulmonary vein drainage into the left atrium.   Normal left atrial appendage without a thrombus.   Normal size of the pulmonary artery.   Please see radiology report for non cardiac findings.   IMPRESSION: 1. Coronary calcium score of 55. This was 37 percentile for age and sex matched control.   2. Normal coronary origin with right dominance.   3. LAD is a large vessel that has focal mid calcified plaque. Mid mild stenosis 30-49%.   CAD-RADS 2. Mild non-obstructive CAD  (25-49%). Consider non-atherosclerotic causes of chest pain. Consider preventive therapy and risk factor modification.   Electronically Signed: By: Donato Schultz M.D. On: 09/29/2021 13:20   Assessment & Plan   DOE- chronic stable.  Underwent echocardiogram 09/21/2021 which showed normal LVEF and G1 DD.  Details above.  Coronary CTA 9/23 showed mild nonobstructive plaque in his LAD. Increase physical activity as tolerated Heart healthy low-sodium diet Continue weight loss  Chest discomfort-denies recent episodes of arm neck back or chest discomfort.  Reassuring coronary CTA. No plans for ischemic evaluation at this time. Increase physical activity as tolerated  Ascending thoracic aortic aneurysm- Noted at 4 cm on coronary CTA. Recommended to have annual imaging. Complete CTA for monitoring.    Aortic dilatation- Mild dilatation of the aortic root measuring 39 mm and mild dilatation of the ascending aorta measuring 41 mm noted on echocardiogram in 09/2021.   Essential hypertension-BP today 122/74. Maintain blood pressure log Continue current medical therapy Heart healthy low-sodium diet  Coronary artery disease-nonobstructive plaque noted on coronary CTA. Stable with no anginal symptoms. No indication for ischemic evaluation.   EKG today shows sinus rhythm with 1st degree AV block, LAD, RBBB, unchanged compared to EKG from year prior.  Continue aspirin, atorvastatin Heart healthy low-sodium diet  Type 2 diabetes-A1c 6.8 on 6/24 Continue metformin Follows with PCP  Disposition: Follow-up with Dr.Berry or me in 12 months.   Jeslie Lowe D. Shelva Majestic, NP     09/19/2022, 8:44 AM [facility] [address] Office [phone] Fax [phone]

## 2022-09-18 ENCOUNTER — Other Ambulatory Visit: Payer: Self-pay | Admitting: Nurse Practitioner

## 2022-09-18 DIAGNOSIS — I10 Essential (primary) hypertension: Secondary | ICD-10-CM

## 2022-09-18 DIAGNOSIS — N182 Chronic kidney disease, stage 2 (mild): Secondary | ICD-10-CM

## 2022-09-19 ENCOUNTER — Encounter: Payer: Self-pay | Admitting: General Practice

## 2022-09-19 ENCOUNTER — Ambulatory Visit: Payer: Medicare Other | Attending: General Practice | Admitting: Cardiology

## 2022-09-19 ENCOUNTER — Telehealth: Payer: Self-pay | Admitting: Nurse Practitioner

## 2022-09-19 VITALS — BP 122/74 | HR 62 | Ht 74.0 in | Wt 271.0 lb

## 2022-09-19 DIAGNOSIS — E782 Mixed hyperlipidemia: Secondary | ICD-10-CM | POA: Diagnosis not present

## 2022-09-19 DIAGNOSIS — I10 Essential (primary) hypertension: Secondary | ICD-10-CM

## 2022-09-19 DIAGNOSIS — R0609 Other forms of dyspnea: Secondary | ICD-10-CM

## 2022-09-19 DIAGNOSIS — I7121 Aneurysm of the ascending aorta, without rupture: Secondary | ICD-10-CM

## 2022-09-19 DIAGNOSIS — E119 Type 2 diabetes mellitus without complications: Secondary | ICD-10-CM | POA: Diagnosis not present

## 2022-09-19 DIAGNOSIS — Z7984 Long term (current) use of oral hypoglycemic drugs: Secondary | ICD-10-CM | POA: Diagnosis not present

## 2022-09-19 DIAGNOSIS — I77810 Thoracic aortic ectasia: Secondary | ICD-10-CM

## 2022-09-19 NOTE — Telephone Encounter (Signed)
Patient advised to take the Propanolol and not the Bisoprolol.

## 2022-09-19 NOTE — Patient Instructions (Signed)
Medication Instructions:  The current medical regimen is effective;  continue present plan and medications as directed. Please refer to the Current Medication list given to you today.  *If you need a refill on your cardiac medications before your next appointment, please call your pharmacy*  Lab Work: BMET THE WEEK BEFORE YOUR CTA If you have labs (blood work) drawn today and your tests are completely normal, you will receive your results only by:  MyChart Message (if you have MyChart) OR A paper copy in the mail If you have any lab test that is abnormal or we need to change your treatment, we will call you to review the results.  Testing/Procedures: CT Angiography (CTA), is a special type of CT scan that uses a computer to produce multi-dimensional views of major blood vessels throughout the body. In CT angiography, a contrast material is injected through an IV to help visualize the blood vessels   Follow-Up: At [facility], you and your health needs are our priority.  As part of our continuing mission to provide you with exceptional heart care, we have created designated Provider Care Teams.  These Care Teams include your primary Cardiologist (physician) and Advanced Practice Providers (APPs -  Physician Assistants and Nurse Practitioners) who all work together to provide you with the care you need, when you need it.  Your next appointment:   12 month(s)  Provider:   Nanetta Batty, MD     Other Instructions

## 2022-09-19 NOTE — Telephone Encounter (Signed)
Patient has been taking Bisoprolol and states that he was put on Propanolol at his last visit with you. Is he supposed to be taking both of those medications together?

## 2022-09-21 DIAGNOSIS — M4322 Fusion of spine, cervical region: Secondary | ICD-10-CM | POA: Diagnosis not present

## 2022-09-21 DIAGNOSIS — M5412 Radiculopathy, cervical region: Secondary | ICD-10-CM | POA: Diagnosis not present

## 2022-09-21 DIAGNOSIS — M791 Myalgia, unspecified site: Secondary | ICD-10-CM | POA: Diagnosis not present

## 2022-09-21 DIAGNOSIS — M4326 Fusion of spine, lumbar region: Secondary | ICD-10-CM | POA: Diagnosis not present

## 2022-09-29 ENCOUNTER — Telehealth: Payer: Self-pay

## 2022-09-29 ENCOUNTER — Inpatient Hospital Stay: Payer: Medicare Other | Admitting: Oncology

## 2022-09-29 ENCOUNTER — Encounter: Payer: Self-pay | Admitting: Oncology

## 2022-09-29 ENCOUNTER — Inpatient Hospital Stay: Payer: Medicare Other | Attending: Oncology

## 2022-09-29 VITALS — BP 161/90 | HR 60 | Temp 98.1°F | Resp 18 | Ht 74.0 in | Wt 273.0 lb

## 2022-09-29 DIAGNOSIS — N189 Chronic kidney disease, unspecified: Secondary | ICD-10-CM | POA: Diagnosis not present

## 2022-09-29 DIAGNOSIS — E1122 Type 2 diabetes mellitus with diabetic chronic kidney disease: Secondary | ICD-10-CM | POA: Diagnosis not present

## 2022-09-29 DIAGNOSIS — D649 Anemia, unspecified: Secondary | ICD-10-CM | POA: Insufficient documentation

## 2022-09-29 DIAGNOSIS — D509 Iron deficiency anemia, unspecified: Secondary | ICD-10-CM

## 2022-09-29 DIAGNOSIS — I129 Hypertensive chronic kidney disease with stage 1 through stage 4 chronic kidney disease, or unspecified chronic kidney disease: Secondary | ICD-10-CM | POA: Diagnosis not present

## 2022-09-29 LAB — CBC WITH DIFFERENTIAL (CANCER CENTER ONLY)
Abs Immature Granulocytes: 0.03 10*3/uL (ref 0.00–0.07)
Basophils Absolute: 0.1 10*3/uL (ref 0.0–0.1)
Basophils Relative: 1 %
Eosinophils Absolute: 0.1 10*3/uL (ref 0.0–0.5)
Eosinophils Relative: 1 %
HCT: 39.3 % (ref 39.0–52.0)
Hemoglobin: 13 g/dL (ref 13.0–17.0)
Immature Granulocytes: 0 %
Lymphocytes Relative: 21 %
Lymphs Abs: 1.6 10*3/uL (ref 0.7–4.0)
MCH: 31.8 pg (ref 26.0–34.0)
MCHC: 33.1 g/dL (ref 30.0–36.0)
MCV: 96.1 fL (ref 80.0–100.0)
Monocytes Absolute: 0.5 10*3/uL (ref 0.1–1.0)
Monocytes Relative: 7 %
Neutro Abs: 5.2 10*3/uL (ref 1.7–7.7)
Neutrophils Relative %: 70 %
Platelet Count: 246 10*3/uL (ref 150–400)
RBC: 4.09 MIL/uL — ABNORMAL LOW (ref 4.22–5.81)
RDW: 14.3 % (ref 11.5–15.5)
WBC Count: 7.4 10*3/uL (ref 4.0–10.5)
nRBC: 0 % (ref 0.0–0.2)

## 2022-09-29 LAB — FERRITIN: Ferritin: 30 ng/mL (ref 24–336)

## 2022-09-29 NOTE — Progress Notes (Signed)
  [facility] OFFICE PROGRESS NOTE   Diagnosis: Anemia  INTERVAL HISTORY:   [name] returns as scheduled.  He generally feels well.  No difficulty with bowel or bladder function.  No bleeding.  He takes iron once daily.  He relates weight loss to the death of his wife.  He has difficulty cooking for 1 person.  Objective:  Vital signs in last 24 hours:  Blood pressure (!) 161/90, pulse 60, temperature 98.1 F (36.7 C), temperature source Oral, resp. rate 18, height 6\' 2"  (1.88 m), weight 273 lb (123.8 kg), SpO2 96%.    Lymphatics: No cervical, supraclavicular, or inguinal nodes.  Prominent bilateral axillary fat pads Resp: Lungs clear bilaterally Cardio: Regular rate and rhythm GI: No hepatosplenomegaly, no mass, nontender Vascular: No leg edema    Lab Results:  Lab Results  Component Value Date   WBC 7.4 09/29/2022   HGB 13.0 09/29/2022   HCT 39.3 09/29/2022   MCV 96.1 09/29/2022   PLT 246 09/29/2022   NEUTROABS 5.2 09/29/2022    CMP  Lab Results  Component Value Date   NA 138 07/06/2022   K 4.6 07/06/2022   CL 104 07/06/2022   CO2 24 07/06/2022   GLUCOSE 190 (H) 07/06/2022   BUN 12 07/06/2022   CREATININE 0.81 07/06/2022   CALCIUM 9.5 07/06/2022   PROT 6.0 (L) 07/06/2022   ALBUMIN 4.3 06/08/2016   AST 13 07/06/2022   ALT 8 (L) 07/06/2022   ALKPHOS 66 06/08/2016   BILITOT 0.6 07/06/2022   GFRNONAA >60 08/16/2020   GFRAA 93 07/22/2020    Medications: I have reviewed the patient's current medications.   Assessment/Plan: History of mild anemia Low ferritin 07/06/2021, 03/04/2020 03/16/2020, 07/22/2021 negative stool Hemoccult cards Urine negative for blood 07/21/2021 Diabetes Hypertension CKD     Disposition: [name] appears stable.  The hemoglobin remains in the low normal range.  We will follow-up on the ferritin level from today. He has a history of iron deficiency in the past.  I recommend he continue iron therapy.  He has undergone  recent upper and lower endoscopies.  The current mild anemia may be related to a normal variant or possibly persistent mild iron deficiency.  I have a low clinical suspicion for a primary hematologic disorder.  He will continue clinical follow-up with Dr. Oneta Wyatt.  He was discharged in the hematology clinic today.  I am glad to see him in the future if he develops progressive anemia or a new hematologic abnormality.    Thornton Papas, MD  09/29/2022  8:16 AM

## 2022-09-29 NOTE — Telephone Encounter (Signed)
Patient have verbal understanding and no further questions or concerns

## 2022-09-29 NOTE — Telephone Encounter (Signed)
-----   Message from Thornton Papas sent at 09/29/2022  3:55 PM EDT ----- Please call patient, the iron levels in the low normal range, continue iron, follow-up as needed

## 2022-10-04 NOTE — Progress Notes (Signed)
Patient ID: Jason Wyatt, male   DOB: 10/26/1955, 67 y.o.   MRN: [id number]  3 month follow up   Assessment and Plan   Essential hypertension Continue medication Monitor blood pressure at home more closely; call if consistently over 130/80 Continue DASH diet.   Reminder to go to the ER if any CP, SOB, nausea, dizziness, severe HA, changes vision/speech, left arm numbness and tingling and jaw pain.  Obesity hypoventilation syndrome Continue CPAP, weight loss encouraged  Type 2 diabetes mellitus with CKD [facility]) Education: Reviewed 'ABCs' of diabetes management (respective goals in parentheses):  A1C (<7), blood pressure (<130/80), and cholesterol (LDL <70) Eye Exam yearly and Dental Exam every 6 months. Dietary recommendations Physical Activity recommendations Persistently above goal with max dose metformin; now on trulicity, tolerating well, currently on 4.5 mg once a week - check lipid panel - A1c  CKD II associated with T2DM (HCC) Increase fluids, avoid NSAIDS, control sugars and blood pressure, will monitor - CMP/GFR  Hyperlipidemia associated with T2DM (HCC) Continue medications; at LDL goal <70 Continue low cholesterol diet and exercise.  Check lipid panel.   DM type 2 with diabetic peripheral neuropathy (HCC)/ Atherosclerotic PVD(HCC) Check feet at home daily, call with changes/concerns Followed closely by podiatry q31m Emphasized need for improved glucose control  Doing treatment through aligned and states possibly a little better sensation Continue Gabapentin 600 mg BID for pain  Primary osteoarthritis of left knee Followed by ortho  RLS Continue meds, increase walking, monitor iron/B12  Type 2 Diabetes Mellitus with Morbid obesity (BMI 41+) Long discussion about weight loss, diet, and exercise Recommended diet heavy in fruits and veggies and low in animal meats, cheeses, and dairy products, appropriate calorie intake Discussed appropriate weight for height   Discussed restricting portions of starches, increase high fiber, information given Continue Trulicity 4.5 mg SQ QW and Metformin 500 mg 2 tabs BID Follow up at next visit  Medication management CBC, CMP/GFR  Idiopathic chronic gout without tophus, unspecified site Continue allopurinol Diet discussed Check uric acid as needed  Hx of prostate cancer S/p robotic excision, followed by Dr. Marlou Porch, Flomax for residual LUTS  Gait instability R/t neuropathy; denies falls; encouraged cane if needed Can refer to PT if getting worse; declines at this time   Future Appointments  Date Time Provider Department Center  11/20/2022  9:00 AM [facility]  12/07/2022  8:45 AM [name], [name], DPM [facility]  01/11/2023  9:00 AM Raynelle Dick, NP [facility] None  03/20/2023 10:00 AM Raynelle Dick, NP [facility] None        HPI BP 124/72   Pulse 80   Temp 97.9 F (36.6 C)   Resp 17   Ht 6\' 2"  (1.88 m)   Wt 273 lb 6.4 oz (124 kg)   SpO2 99%   BMI 35.10 kg/m   67 y.o. male  presents for AWV and 3 month follow up with hypertension, hyperlipidemia, diabetes and vitamin D deficiency. He has CKD stage 2 due to type 2 diabetes mellitus (HCC); Morbid obesity (HCC); Obesity hypoventilation syndrome (HCC); T2_NIDDM w/ CKD 2 ; Gout; Hyperlipidemia associated with type 2 diabetes mellitus (HCC); Hypertension; RLS; Medication management; DM type 2 with diabetic peripheral neuropathy (HCC); Normal coronary arteries; Primary osteoarthritis of left knee; Gait instability; Bilateral carpal tunnel syndrome; Heart rate fast; History of prostate cancer; Iron deficiency anemia; Cubital tunnel syndrome; Obesity with alveolar hypoventilation, unspecified obesity severity (HCC); CPAP (continuous positive airway pressure) dependence; Other  insomnia; S/P UPPP (uvulopalatopharyngoplasty); Shortness of breath; OSA on CPAP; Severe obstructive sleep apnea; and Atherosclerotic peripheral vascular disease  (HCC) on their problem list.  He has  2 sons, no grandchildren. Enjoys his dog. He is semi retired Optician, dispensing. His wife died 2021-12-05. His mood is labile, depending on thoughts about wife  He is having a CT Angio Chest Aorta w/CM & OR to reevaluated Thoracic aortic aneurysm reevaluation He is on CPAP for OSA/obesity hypoventilation syndrome and endorses 100% compliance and restorative sleep.   He has imbalance attributed to advanced peripheral neuropathy, no falls this year, does ambulate without cane if needed. Son drives him when needed. He follows with Dr. Salvatore Decent for back pain. Follows with podiatry Dr. Al Corpus for diabetic neuropathy. He is using a nerve plate on his feet TID and warm bath with electrodes Bid and then hands, also doing a bright light therapy through aligned therapy [phone]. He believes it is helping somewhat  He is on sinemet BID for RLS. Also takes gabapentin 600 mg TID PRN.   He is taking flomax in the AM per urology for sense of incomplete bladder emptying; hx of prostate cancer and robotic excision in 2014 and followed by Dr. Marlou Porch at [facility]. PSAs have remained <0.1.   He has a diagnosis of GERD which is currently well managed by pantoprazole 40 mg  BMI is Body mass index is 35.1 kg/m., he has not been working on diet and exercise due back/joint pain. Admits he eats potatoes daily, knows he needs to do better with diet.  Wt Readings from Last 3 Encounters:  10/06/22 273 lb 6.4 oz (124 kg)  09/29/22 273 lb (123.8 kg)  09/19/22 271 lb (122.9 kg)   His blood pressure has been controlled at home on Propranolol 40 mg every day, Olmesartan 40 mg, today their BP is BP: 124/72  BP Readings from Last 3 Encounters:  10/06/22 124/72  09/29/22 (!) 161/90  09/19/22 122/74   He does workout, walks daily with dog. He denies chest pain, shortness of breath, dizziness.   He is on cholesterol medication, on Atorvastatin 20 mg every other day denies myalgias. His  cholesterol is not at goal. The cholesterol last visit was:   Lab Results  Component Value Date   CHOL 85 07/06/2022   HDL 32 (L) 07/06/2022   LDLCALC 26 07/06/2022   TRIG 204 (H) 07/06/2022   CHOLHDL 2.7 07/06/2022    He has been working on diet  for Type 2 diabetes on metformin 2000 mg daily, recently started on trulicity 3 mg weekly, and denies foot ulcerations, increased appetite, nausea, polydipsia, polyuria, visual disturbances, vomiting and weight loss.  Trulicity 4.5 mg SQ QW, Metformin 500 2 tab BID Neuropathy follows with podiatry, on Gabapentin 600 mg BID Diabetic eye -  [facility]-08/2022 He has been checking fasting glucose, log demonstrates improved from 120-155 Last A1C in the office was:  Lab Results  Component Value Date   HGBA1C 6.8 (H) 07/06/2022   He has CKD II associated with T2DM monitored at this office. On enalapril. Last GFR:  Lab Results  Component Value Date   EGFR 95 07/06/2022      Patient is on Vitamin D supplement, taking 16109 IU every other day    Lab Results  Component Value Date   VD25OH 115 (H) 07/06/2022     Patient is on allopurinol for gout and does not report a recent flare.  Lab Results  Component Value  Date   LABURIC 5.2 09/06/2021      Current Outpatient Medications  Medication Instructions   acyclovir (ZOVIRAX) 400 MG tablet TAKE 1 TABLET BY MOUTH DAILY AS  NEEDED FOR COLD SORES   allopurinol (ZYLOPRIM) 300 MG tablet TAKE 1 TABLET BY MOUTH  DAILY FOR GOUT PREVENTION   aspirin 81 mg, Oral, Daily   atorvastatin (LIPITOR) 20 mg, Oral, Daily   Blood Glucose Monitoring Suppl (ONE TOUCH ULTRA 2) w/Device KIT USE AS DIRECTED   carbidopa-levodopa (SINEMET IR) 25-250 MG tablet TAKE 1 TABLET BY MOUTH  TWICE DAILY FOR RESTLESS  LEGS   Cyanocobalamin (B-12) 2500 MCG SUBL Sublingual, Every other day   dextromethorphan-guaiFENesin (MUCINEX DM) 30-600 MG 12hr tablet 1 tablet, Oral, 2 times daily PRN   DULoxetine (CYMBALTA) 60 mg, Oral,  Daily at bedtime   ferrous sulfate 325 mg, Oral, Daily with breakfast   fexofenadine (ALLEGRA) 180 mg, Oral, Daily PRN   fluticasone (FLONASE) 50 MCG/ACT nasal spray 2 sprays each nostril once a day   gabapentin (NEURONTIN) 600 mg, Oral, 2 times daily   Lancets (ONETOUCH DELICA PLUS LANCET33G) MISC USE WITH METER TO CHECK BLOOD  SUGAR ONCE DAILY   Magnesium 800 mg, Oral, Nightly   meloxicam (MOBIC) 7.5 MG tablet Take 1 tablet Daily  with Food  is needed for Pain   meloxicam (MOBIC) 15 mg, Oral, Daily   metFORMIN (GLUCOPHAGE-XR) 500 MG 24 hr tablet TAKE 2 TABLETS BY MOUTH  TWICE DAILY WITH MEALS FOR  DIABETES   methocarbamol (ROBAXIN) 500 mg, Oral, 3 times daily PRN, PRN   Multiple Vitamin (MULTIVITAMIN) tablet 1 tablet, Oral, Daily   olmesartan (BENICAR) 40 MG tablet TAKE 1 TABLET BY MOUTH AT NIGHT  FOR BLOOD PRESSURE AND DIABETIC  KIDNEY PROTECTION   Omega 3 1200 MG CAPS 1 capsule, Oral, Daily   ONETOUCH ULTRA test strip USE TO CHECK BLOOD SUGAR DAILY   pantoprazole (PROTONIX) 40 MG tablet TAKE 1 TABLET DAILY TO  PREVENT HEARTBURN &amp;  INDIGESTION   propranolol (INDERAL) 40 mg, Oral, Daily   tamsulosin (FLOMAX) 0.4 MG CAPS capsule TAKE 2 CAPSULES BY MOUTH AT  BEDTIME FOR PROSTATE   TRULICITY 4.5 MG/0.5ML SOPN Inject 1 pen  (4.5 mg) into Skin every 7 days   Vitamin D, Ergocalciferol, (DRISDOL) 1.25 MG (50000 UNIT) CAPS capsule TAKE 1 CAPSULE BY MOUTH 2 DAYS  WEEKLY FOR VITAMIN D DEFICIENCY     Medical History:  Past Medical History:  Diagnosis Date   Acute meniscal tear of left knee    Allergic rhinitis    Arthritis     back   Borderline glaucoma    Chronic back pain    stenosis   CKD (chronic kidney disease), stage II    Complication of anesthesia cervical fusion surgery 06/2010   "woke up next day w/ventilator on" told due to OSA   GERD (gastroesophageal reflux disease)    takes Prevacid daily   Gout    takes Allopurinol daily and Colchicine if needed   History of  adenomatous polyp of colon    tubular adenoma's   History of Barrett's esophagus    History of hiatal hernia    Hyperlipidemia    Hypertension    Incomplete right bundle branch block (RBBB)    OA (osteoarthritis)    knee   OSA on CPAP    Peripheral neuropathy    Prostate cancer [facility]) urologist-  dr Laverle Patter   dx 2013  s/p  radical non-nerve sparing  prostatectomy 2014--  Stage pT2cNx,  PSA 3.48,  Gleason 3+3,  vol 55cc/  current PSA  0.01 (Aug 2016)   Rupture of Achilles tendon    Thyroid nodule    left side   Type II diabetes mellitus (HCC)    Urinary frequency    takes Flomax daily   Urine incontinence    Wears glasses    Allergies Allergies  Allergen Reactions   Fenofibrate Itching and Rash   Flaxseed Oil Rash   Flaxseed [Flaxseed (Linseed)] Rash   Lyrica [Pregabalin] Itching and Rash   Niaspan [Niacin Er] Rash   Preventative Medicine Immunization History  Administered Date(s) Administered   Influenza Inj Mdck Quad With Preservative 11/22/2016, 12/04/2017   Influenza Split 10/17/2013, 10/29/2014   Influenza, High Dose Seasonal PF 11/26/2018, 11/27/2019, 11/29/2020, 12/06/2021   Influenza, Seasonal, Injecte, Preservative Fre 11/29/2015   Influenza-Unspecified 11/04/2012   PFIZER(Purple Top)SARS-COV-2 Vaccination 03/10/2019, 03/31/2019, 11/06/2019   PPD Test 01/16/2006, 04/07/2013   Pfizer Covid-19 Vaccine Bivalent Booster 17yrs & up 02/16/2021   Pneumococcal Conjugate-13 07/29/2018   Pneumococcal Polysaccharide-23 04/07/2013, 11/22/2016   Pneumococcal-Unspecified 09/17/2010   Td 01/17/2011, 03/10/2021   Td (Adult), 2 Lf Tetanus Toxid, Preservative Free 01/17/2011   Zoster Recombinant(Shingrix) 02/16/2021   Zoster, Live 01/16/2005   Health Maintenance  Topic Date Due   Zoster Vaccines- Shingrix (2 of 2) 04/13/2021   INFLUENZA VACCINE  08/17/2022   COVID-19 Vaccine (5 - 2023-24 season) 09/17/2022   Diabetic kidney evaluation - Urine ACR  12/17/2022   FOOT EXAM   12/17/2022   OPHTHALMOLOGY EXAM  12/30/2022   HEMOGLOBIN A1C  01/05/2023   Medicare Annual Wellness (AWV)  03/22/2023   Diabetic kidney evaluation - eGFR measurement  07/06/2023   Pneumonia Vaccine 67+ Years old (3 of 3 - PPSV23 or PCV20) 07/29/2023   Colonoscopy  11/24/2026   DTaP/Tdap/Td (3 - Tdap) 03/11/2031   Hepatitis C Screening  Completed   HPV VACCINES  Aged Out     Vision: Dr. Harriette Bouillon, 12/2020,  Dental: Dr. Alvester Morin, 10/2019, goes q66m,01/2021  Patient Care Team: Lucky Cowboy, MD as PCP - General (Internal Medicine) Runell Gess, MD as PCP - Cardiology (Cardiology) Elinor Parkinson, DPM as Consulting Physician (Podiatry) Glenford Peers, OD as Referring Physician (Optometry) [place], Peter M, MD as Consulting Physician (Cardiology) Bernette Redbird, MD as Consulting Physician (Gastroenterology) Jene Every, MD as Consulting Physician (Orthopedic Surgery) Crist Fat, MD as Attending Physician (Urology) Sundra Aland, [facility] (Inactive) as Pharmacist (Pharmacist)   SURGICAL HISTORY He  has a past surgical history that includes Posterior fusion cervical spine (07-05-2010); Esophagogastroduodenoscopy (09/26/2011); Robot assisted laparoscopic radical prostatectomy (02/07/2012); wisdom teeth extracted  (1982); left and right heart catheterization with coronary angiogram (N/A, 08/25/2011); Anal fissure repair (06-05-2002); Cardiovascular stress test (01-01-2008); transthoracic echocardiogram (08-03-2011); Posterior lumbar fusion (05-18-2014); Uvulopalatopharyngoplasty (uppp)/tonsillectomy/septoplasty (1990's); Knee arthroscopy with medial menisectomy (Left, 12/31/2014); Cardiac catheterization; Back surgery; Total knee arthroplasty (Left, 08/05/2015); Gastroc recession extremity (Left, 06/27/2017); Achilles tendon surgery (Left, 06/27/2017); Tendon transfer (Left, 06/27/2017); Carpal tunnel release (Left, 03/27/2018); and Carpal tunnel release (Right, 06/2018). FAMILY HISTORY His  family history includes Cancer in his father; Dementia in his mother; Heart attack in his maternal grandmother; Hyperlipidemia in his sister; Hypertension in his mother, paternal grandfather, and sister; Osteoarthritis in his mother; Osteoporosis in his mother; Sleep apnea in his son and son; Stroke in his maternal grandmother. SOCIAL HISTORY He  reports that he has never smoked. He has never used smokeless tobacco. He reports that  he does not drink alcohol and does not use drugs.      Review of Systems:  Review of Systems  Constitutional:  Negative for chills, diaphoresis, fever, malaise/fatigue and weight loss.  HENT:  Negative for congestion, ear pain, hearing loss, sore throat and tinnitus.   Eyes: Negative.  Negative for blurred vision and double vision.  Respiratory:  Negative for cough, sputum production, shortness of breath and wheezing.   Cardiovascular:  Negative for chest pain, palpitations, orthopnea, claudication, leg swelling and PND.  Gastrointestinal:  Negative for abdominal pain, blood in stool, constipation, diarrhea, heartburn, melena, nausea and vomiting.  Genitourinary: Negative.   Musculoskeletal:  Positive for back pain and joint pain (knees). Negative for falls and myalgias.  Skin: Negative.  Negative for rash.  Neurological:  Positive for tingling (bil fingers) and sensory change (chronic numbness of bilateral hands, bil feet and lower legs). Negative for dizziness, loss of consciousness, weakness and headaches.  Endo/Heme/Allergies:  Negative for polydipsia. Bruises/bleeds easily.  Psychiatric/Behavioral: Negative.  Negative for depression, memory loss, substance abuse and suicidal ideas. The patient is not nervous/anxious and does not have insomnia.   All other systems reviewed and are negative.   Physical Exam: BP 124/72   Pulse 80   Temp 97.9 F (36.6 C)   Resp 17   Ht 6\' 2"  (1.88 m)   Wt 273 lb 6.4 oz (124 kg)   SpO2 99%   BMI 35.10 kg/m  Wt Readings  from Last 3 Encounters:  10/06/22 273 lb 6.4 oz (124 kg)  09/29/22 273 lb (123.8 kg)  09/19/22 271 lb (122.9 kg)   General Appearance: Well nourished well developed, non-toxic appearing, in no apparent distress. Eyes: PERRLA, EOMs, conjunctiva no swelling or erythema ENT/Mouth: Ear canals clear with no erythema, swelling, or discharge.  TMs normal bilaterally, oropharynx clear, moist, with no exudate.   Neck: Supple, thyroid normal, no JVD, no cervical adenopathy.  Respiratory: Respiratory effort normal, breath sounds clear A&P, no wheeze, rhonchi or rales noted.  No retractions, no accessory muscle usage Cardio: RRR with no MRGs. No edema noted. Intact pedal and post tibial pulses Abdomen: Soft, + BS, morbidly obese abdomen limiting exam.  Non tender, no guarding, rebound, hernias, he has mild ventral hernia with bearing down. Musculoskeletal: Full ROM, 5/5 strength, antalgic gait with cane Skin: Warm, dry without rashes, many ecchymoses of arms. Calluses to bilateral feet without ulcers or wounds. Neuro: Awake and oriented X 3, Cranial nerves intact. No cerebellar symptoms. He has 0/10 to monofilament in bilateral feet, lower legs and fingertips Psych: normal affect, Insight and Judgment appropriate.       Raynelle Dick, NP 9:41 AM [facility]

## 2022-10-06 ENCOUNTER — Ambulatory Visit (INDEPENDENT_AMBULATORY_CARE_PROVIDER_SITE_OTHER): Payer: Medicare Other | Admitting: Nurse Practitioner

## 2022-10-06 ENCOUNTER — Encounter: Payer: Self-pay | Admitting: Nurse Practitioner

## 2022-10-06 DIAGNOSIS — M1712 Unilateral primary osteoarthritis, left knee: Secondary | ICD-10-CM | POA: Diagnosis not present

## 2022-10-06 DIAGNOSIS — E1142 Type 2 diabetes mellitus with diabetic polyneuropathy: Secondary | ICD-10-CM | POA: Diagnosis not present

## 2022-10-06 DIAGNOSIS — Z79899 Other long term (current) drug therapy: Secondary | ICD-10-CM

## 2022-10-06 DIAGNOSIS — E559 Vitamin D deficiency, unspecified: Secondary | ICD-10-CM

## 2022-10-06 DIAGNOSIS — E785 Hyperlipidemia, unspecified: Secondary | ICD-10-CM

## 2022-10-06 DIAGNOSIS — E1169 Type 2 diabetes mellitus with other specified complication: Secondary | ICD-10-CM | POA: Diagnosis not present

## 2022-10-06 DIAGNOSIS — Z8546 Personal history of malignant neoplasm of prostate: Secondary | ICD-10-CM

## 2022-10-06 DIAGNOSIS — G2581 Restless legs syndrome: Secondary | ICD-10-CM

## 2022-10-06 DIAGNOSIS — R2681 Unsteadiness on feet: Secondary | ICD-10-CM | POA: Diagnosis not present

## 2022-10-06 DIAGNOSIS — N182 Chronic kidney disease, stage 2 (mild): Secondary | ICD-10-CM

## 2022-10-06 DIAGNOSIS — G4733 Obstructive sleep apnea (adult) (pediatric): Secondary | ICD-10-CM | POA: Diagnosis not present

## 2022-10-06 DIAGNOSIS — E662 Morbid (severe) obesity with alveolar hypoventilation: Secondary | ICD-10-CM

## 2022-10-06 DIAGNOSIS — I10 Essential (primary) hypertension: Secondary | ICD-10-CM

## 2022-10-06 DIAGNOSIS — E1122 Type 2 diabetes mellitus with diabetic chronic kidney disease: Secondary | ICD-10-CM | POA: Diagnosis not present

## 2022-10-06 DIAGNOSIS — M1A00X0 Idiopathic chronic gout, unspecified site, without tophus (tophi): Secondary | ICD-10-CM

## 2022-10-06 DIAGNOSIS — I70209 Unspecified atherosclerosis of native arteries of extremities, unspecified extremity: Secondary | ICD-10-CM | POA: Diagnosis not present

## 2022-10-06 NOTE — Patient Instructions (Signed)

## 2022-10-07 LAB — COMPLETE METABOLIC PANEL WITH GFR
AG Ratio: 1.9 (calc) (ref 1.0–2.5)
ALT: 9 U/L (ref 9–46)
AST: 10 U/L (ref 10–35)
Albumin: 4.6 g/dL (ref 3.6–5.1)
Alkaline phosphatase (APISO): 76 U/L (ref 35–144)
BUN: 16 mg/dL (ref 7–25)
CO2: 30 mmol/L (ref 20–32)
Calcium: 9.8 mg/dL (ref 8.6–10.3)
Chloride: 99 mmol/L (ref 98–110)
Creat: 0.82 mg/dL (ref 0.70–1.35)
Globulin: 2.4 g/dL (calc) (ref 1.9–3.7)
Glucose, Bld: 155 mg/dL — ABNORMAL HIGH (ref 65–99)
Potassium: 4.8 mmol/L (ref 3.5–5.3)
Sodium: 138 mmol/L (ref 135–146)
Total Bilirubin: 0.7 mg/dL (ref 0.2–1.2)
Total Protein: 7 g/dL (ref 6.1–8.1)
eGFR: 95 mL/min/{1.73_m2} (ref >=60)

## 2022-10-07 LAB — CBC WITH DIFFERENTIAL/PLATELET
Absolute Monocytes: 511 cells/uL (ref 200–950)
Basophils Absolute: 73 cells/uL (ref 0–200)
Basophils Relative: 1 %
Eosinophils Absolute: 131 cells/uL (ref 15–500)
Eosinophils Relative: 1.8 %
HCT: 41.7 % (ref 38.5–50.0)
Hemoglobin: 13.9 g/dL (ref 13.2–17.1)
Lymphs Abs: 1555 cells/uL (ref 850–3900)
MCH: 32.5 pg (ref 27.0–33.0)
MCHC: 33.3 g/dL (ref 32.0–36.0)
MCV: 97.4 fL (ref 80.0–100.0)
MPV: 9.8 fL (ref 7.5–12.5)
Monocytes Relative: 7 %
Neutro Abs: 5030 cells/uL (ref 1500–7800)
Neutrophils Relative %: 68.9 %
Platelets: 239 10*3/uL (ref 140–400)
RBC: 4.28 10*6/uL (ref 4.20–5.80)
RDW: 13.1 % (ref 11.0–15.0)
Total Lymphocyte: 21.3 %
WBC: 7.3 10*3/uL (ref 3.8–10.8)

## 2022-10-07 LAB — LIPID PANEL
Cholesterol: 102 mg/dL (ref <200)
HDL: 41 mg/dL (ref >=40)
LDL Cholesterol (Calc): 41 mg/dL (calc)
Non-HDL Cholesterol (Calc): 61 mg/dL (calc) (ref <130)
Total CHOL/HDL Ratio: 2.5 (calc) (ref <5.0)
Triglycerides: 118 mg/dL (ref <150)

## 2022-10-07 LAB — HEMOGLOBIN A1C W/OUT EAG: Hgb A1c MFr Bld: 6.7 % of total Hgb — ABNORMAL HIGH (ref <5.7)

## 2022-10-09 ENCOUNTER — Ambulatory Visit: Payer: Medicare Other | Admitting: Nurse Practitioner

## 2022-10-12 ENCOUNTER — Other Ambulatory Visit: Payer: Self-pay

## 2022-10-12 DIAGNOSIS — E1122 Type 2 diabetes mellitus with diabetic chronic kidney disease: Secondary | ICD-10-CM

## 2022-10-12 MED ORDER — TRULICITY 4.5 MG/0.5ML ~~LOC~~ SOAJ
SUBCUTANEOUS | 3 refills | Status: DC
Start: 2022-10-12 — End: 2022-10-12

## 2022-10-12 MED ORDER — TRULICITY 4.5 MG/0.5ML ~~LOC~~ SOAJ
SUBCUTANEOUS | 3 refills | Status: DC
Start: 2022-10-12 — End: 2023-11-19

## 2022-10-19 ENCOUNTER — Encounter: Payer: Self-pay | Admitting: Adult Health

## 2022-10-19 ENCOUNTER — Ambulatory Visit: Payer: Medicare Other | Admitting: Adult Health

## 2022-10-19 VITALS — BP 149/89 | HR 73 | Ht 74.0 in | Wt 271.8 lb

## 2022-10-19 DIAGNOSIS — G4733 Obstructive sleep apnea (adult) (pediatric): Secondary | ICD-10-CM | POA: Diagnosis not present

## 2022-10-19 NOTE — Patient Instructions (Signed)
You will be called by your DME company to look at different mask options   Continue nightly use of CPAP for adequate sleep apnea management     Follow up in 1 year or call earlier if needed

## 2022-10-19 NOTE — Progress Notes (Signed)
[facility] [address] [place]. [place] [address] [phone]       OFFICE FOLLOW UP NOTE  Mr. JAE PETCH Date of Birth:  Apr 25, 1955 Medical Record Number:  [id number]    Primary neurologist: Dr. Vickey Huger Reason for visit: CPAP follow-up    SUBJECTIVE:    Follow-up visit:  Prior visit: 10/18/2020  Brief HPI:   Jason Wyatt is a 67 y.o. male with PMH of CKD stage II, HTN, HLD, RBBB, diabetes with peripheral neuropathy, obesity and longstanding history of sleep apnea initially diagnosed 2007.  Current use of CPAP but machine on recall and needed new machine.  Repeat sleep study 07/14/2020 confirmed severe sleep apnea with total AHI of 58.3/h although noted limited recording time as difficulty sleeping without CPAP. Received new CPAP machine 07/2020.   At prior visit, noted excellent compliance and optimal residual AHI.   Interval history:  Returns for CPAP compliance visit visit after 2 years ago accompanied by his son. Past 30-day CPAP compliance report shows 30 out of 30 usage days with 30 days greater than 4 hours for 100% compliance.  Residual AHI 4.2.  Pressure in the 95 percentile 8.6 on pressure setting of 6-18 with iPR level 2.  Leaks in the 93rd percentile 34.6.  Reports his dog will wake him up in the middle of the night, he is concerned that this is because he stops breathing while he is sleeping despite using CPAP.  He does have mild leak which could be causing his dog to do this as his apnea is overall very well treated. He will notice occasional leaks but usually this is due to needing to replace his mask. Current use of nasal mask, previously used a different type of nasal mask which he would prefer to use (with old machine) but was told 2 years ago his insurance would not cover.      ROS:   14 system review of systems performed and negative with exception of those listed in HPI  PMH:  Past Medical History:  Diagnosis Date   Acute  meniscal tear of left knee    Allergic rhinitis    Arthritis     back   Borderline glaucoma    Chronic back pain    stenosis   CKD (chronic kidney disease), stage II    Complication of anesthesia cervical fusion surgery 06/2010   "woke up next day w/ventilator on" told due to OSA   GERD (gastroesophageal reflux disease)    takes Prevacid daily   Gout    takes Allopurinol daily and Colchicine if needed   History of adenomatous polyp of colon    tubular adenoma's   History of Barrett's esophagus    History of hiatal hernia    Hyperlipidemia    Hypertension    Incomplete right bundle branch block (RBBB)    OA (osteoarthritis)    knee   OSA on CPAP    Peripheral neuropathy    Prostate cancer [facility]) urologist-  dr Laverle Patter   dx 2013  s/p  radical non-nerve sparing prostatectomy 2014--  Stage pT2cNx,  PSA 3.48,  Gleason 3+3,  vol 55cc/  current PSA  0.01 (Aug 2016)   Rupture of Achilles tendon    Thyroid nodule    left side   Type II diabetes mellitus (HCC)    Urinary frequency    takes Flomax daily   Urine incontinence    Wears glasses     PSH:  Past Surgical  History:  Procedure Laterality Date   ACHILLES TENDON SURGERY Left 06/27/2017   Procedure: ACHILLES TENDON REPAIR;  Surgeon: Park Liter, DPM;  Location: [facility];  Service: Podiatry;  Laterality: Left;   ANAL FISSURE REPAIR  06-05-2002   BACK SURGERY     CARDIAC CATHETERIZATION     CARDIOVASCULAR STRESS TEST  01-01-2008   normal nuclear study/  no ischemia/  normal LV function and wall motion , 57%   CARPAL TUNNEL RELEASE Left 03/27/2018   CARPAL TUNNEL RELEASE Right 06/2018   ESOPHAGOGASTRODUODENOSCOPY  09/26/2011   Procedure: ESOPHAGOGASTRODUODENOSCOPY (EGD);  Surgeon: Florencia Reasons, MD;  Location: [facility];  Service: Endoscopy;  Laterality: N/A;   GASTROC RECESSION EXTREMITY Left 06/27/2017   Procedure: GASTROC RECESSION EXTREMITY;  Surgeon: Park Liter, DPM;  Location: [facility];  Service: Podiatry;   Laterality: Left;   KNEE ARTHROSCOPY WITH MEDIAL MENISECTOMY Left 12/31/2014   Procedure: LEFT KNEE ARTHROSCOPY, PARTIAL MEDIAL AND LATERAL  MENISECTOMIES WITH DEDRIDEMENT;  Surgeon: Jene Every, MD;  Location: [facility];  Service: Orthopedics;  Laterality: Left;   LEFT AND RIGHT HEART CATHETERIZATION WITH CORONARY ANGIOGRAM N/A 08/25/2011   Procedure: LEFT AND RIGHT HEART CATHETERIZATION WITH CORONARY ANGIOGRAM;  Surgeon: Peter M Swaziland, MD;  Location: [facility] CATH LAB;  Service: Cardiovascular;  Laterality: N/A;   Normal coronary arteries and LVF, ef 55-60%   POSTERIOR FUSION CERVICAL SPINE  07-05-2010   C1 --  C4   POSTERIOR LUMBAR FUSION  05-18-2014   L4 -- S1   ROBOT ASSISTED LAPAROSCOPIC RADICAL PROSTATECTOMY  02/07/2012   Procedure: ROBOTIC ASSISTED LAPAROSCOPIC RADICAL PROSTATECTOMY LEVEL 1;  Surgeon: Milford Cage, MD;  Location: [facility];  Service: Urology;  Laterality: N/A;      TENDON TRANSFER Left 06/27/2017   Procedure: TENDON TRANSFER;  Surgeon: Park Liter, DPM;  Location: [facility];  Service: Podiatry;  Laterality: Left;   TOTAL KNEE ARTHROPLASTY Left 08/05/2015   Procedure: LEFT TOTAL KNEE ARTHROPLASTY;  Surgeon: Jene Every, MD;  Location: [facility];  Service: Orthopedics;  Laterality: Left;   TRANSTHORACIC ECHOCARDIOGRAM  08-03-2011   mild LVH, ef 55-60%/  trivial TR and PR   UVULOPALATOPHARYNGOPLASTY (UPPP)/TONSILLECTOMY/SEPTOPLASTY  1990's   and Adenoidectomy   wisdom teeth extracted   1982    Social History:  Social History   Socioeconomic History   Marital status: Widowed    Spouse name: Not on file   Number of children: 2   Years of education: Not on file   Highest education level: Some college, no degree  Occupational History   Not on file  Tobacco Use   Smoking status: Never   Smokeless tobacco: Never  Vaping Use   Vaping status: Never Used  Substance and Sexual Activity   Alcohol use: Never   Drug use: Never   Sexual activity: Not  on file  Other Topics Concern   Not on file  Social History Narrative   Lives at home with wife   Caffeine: never   Social Determinants of Health   Financial Resource Strain: Not on file  Food Insecurity: Not on file  Transportation Needs: Not on file  Physical Activity: Not on file  Stress: Not on file  Social Connections: Not on file  Intimate Partner Violence: Not on file    Family History:  Family History  Problem Relation Age of Onset   Hypertension Mother    Dementia Mother    Osteoporosis Mother    Osteoarthritis Mother  Cancer Father    Hyperlipidemia Sister    Hypertension Sister    Stroke Maternal Grandmother    Heart attack Maternal Grandmother    Hypertension Paternal Grandfather    Sleep apnea Son    Sleep apnea Son     Medications:   Current Outpatient Medications on File Prior to Visit  Medication Sig Dispense Refill   acyclovir (ZOVIRAX) 400 MG tablet TAKE 1 TABLET BY MOUTH DAILY AS  NEEDED FOR COLD SORES 90 tablet 2   allopurinol (ZYLOPRIM) 300 MG tablet TAKE 1 TABLET BY MOUTH  DAILY FOR GOUT PREVENTION 90 tablet 3   aspirin 81 MG tablet Take 81 mg by mouth daily.     atorvastatin (LIPITOR) 20 MG tablet Take 1 tablet (20 mg total) by mouth daily. 90 tablet 3   Blood Glucose Monitoring Suppl (ONE TOUCH ULTRA 2) w/Device KIT USE AS DIRECTED 1 kit 1   carbidopa-levodopa (SINEMET IR) 25-250 MG tablet TAKE 1 TABLET BY MOUTH  TWICE DAILY FOR RESTLESS  LEGS 180 tablet 3   Cyanocobalamin (B-12) 2500 MCG SUBL Place under the tongue every other day.     dextromethorphan-guaiFENesin (MUCINEX DM) 30-600 MG 12hr tablet Take 1 tablet by mouth 2 (two) times daily as needed for cough.     DULoxetine (CYMBALTA) 60 MG capsule TAKE 1 CAPSULE BY MOUTH AT  BEDTIME 90 capsule 3   ferrous sulfate 325 (65 FE) MG tablet Take 325 mg by mouth daily with breakfast.     fexofenadine (ALLEGRA) 180 MG tablet Take 180 mg by mouth daily as needed.     fluticasone (FLONASE) 50  MCG/ACT nasal spray 2 sprays each nostril once a day 16 g 2   gabapentin (NEURONTIN) 600 MG tablet Take 1 tablet (600 mg total) by mouth 2 (two) times daily. 180 tablet 3   Lancets (ONETOUCH DELICA PLUS LANCET33G) MISC USE WITH METER TO CHECK BLOOD  SUGAR ONCE DAILY 100 each 3   Magnesium 400 MG CAPS Take 800 mg by mouth at bedtime.     metFORMIN (GLUCOPHAGE-XR) 500 MG 24 hr tablet TAKE 2 TABLETS BY MOUTH  TWICE DAILY WITH MEALS FOR  DIABETES 360 tablet 3   methocarbamol (ROBAXIN) 500 MG tablet Take 500 mg by mouth 3 (three) times daily as needed. PRN     Multiple Vitamin (MULTIVITAMIN) tablet Take 1 tablet by mouth daily.     Naphazoline-Pheniramine (EYE ALLERGY RELIEF OP) Apply to eye.     olmesartan (BENICAR) 40 MG tablet TAKE 1 TABLET BY MOUTH AT NIGHT  FOR BLOOD PRESSURE AND DIABETIC  KIDNEY PROTECTION 90 tablet 3   Omega 3 1200 MG CAPS Take 1 capsule by mouth daily.     ONETOUCH ULTRA test strip USE TO CHECK BLOOD SUGAR DAILY 100 strip 3   pantoprazole (PROTONIX) 40 MG tablet TAKE 1 TABLET DAILY TO  PREVENT HEARTBURN &amp;  INDIGESTION 90 tablet 3   propranolol (INDERAL) 40 MG tablet Take 1 tablet (40 mg total) by mouth daily. 90 tablet 3   tamsulosin (FLOMAX) 0.4 MG CAPS capsule TAKE 2 CAPSULES BY MOUTH AT  BEDTIME FOR PROSTATE 180 capsule 3   TRULICITY 4.5 MG/0.5ML SOPN Inject 1 pen  (4.5 mg) into Skin every 7 days 6 mL 3   Vitamin D, Ergocalciferol, (DRISDOL) 1.25 MG (50000 UNIT) CAPS capsule TAKE 1 CAPSULE BY MOUTH 2 DAYS  WEEKLY FOR VITAMIN D DEFICIENCY 26 capsule 3   meloxicam (MOBIC) 7.5 MG tablet Take 1 tablet Daily  with  Food  is needed for Pain (Patient not taking: Reported on 09/19/2022) 20 tablet 2   No current facility-administered medications on file prior to visit.    Allergies:   Allergies  Allergen Reactions   Fenofibrate Itching and Rash   Flaxseed Oil Rash   Flaxseed [Flaxseed (Linseed)] Rash   Lyrica [Pregabalin] Itching and Rash   Niaspan [Niacin Er] Rash       OBJECTIVE:  Physical Exam  Vitals:   10/19/22 0800  BP: (!) 149/89  Pulse: 73  Weight: 271 lb 12.8 oz (123.3 kg)  Height: 6\' 2"  (1.88 m)   Body mass index is 34.9 kg/m. No results found.   General: well developed, well nourished, very pleasant elderly Caucasian male, seated, in no evident distress Head: head normocephalic and atraumatic.   Neck: supple with no carotid or supraclavicular bruits Cardiovascular: regular rate and rhythm, no murmurs Musculoskeletal: no deformity Skin:  no rash/petichiae Vascular:  Normal pulses all extremities   Neurologic Exam Mental Status: Awake and fully alert. Oriented to place and time. Recent and remote memory intact. Attention span, concentration and fund of knowledge appropriate. Mood and affect appropriate.  Cranial Nerves: Pupils equal, briskly reactive to light. Extraocular movements full without nystagmus. Visual fields full to confrontation. Hearing intact. Facial sensation intact. Face, tongue, palate moves normally and symmetrically.  Motor: Normal bulk and tone. Normal strength in all tested extremity muscles Gait and Station: Arises from chair without difficulty. Stance is normal.  Ambulates with cane. Reflexes: 1+ and symmetric. Toes downgoing.         ASSESSMENT/PLAN: Jason Wyatt is a 67 y.o. year old male    OSA on CPAP : Compliance report shows satisfactory usage with optimal residual AHI.  Continue current pressure settings.  Will place order to DME to see if he is able to obtain similar nasal mask to his previously used nasal mask (with old machine) possibly that insurance will cover.  Discussed continued nightly usage with ensuring greater than 4 hours nightly for optimal benefit and per insurance purposes.  Continue to follow with DME company for any needed supplies or CPAP related concerns     Follow up in 1 year or call earlier if needed   CC:  PCP: Lucky Cowboy, MD    I spent 25 minutes of  face-to-face and non-face-to-face time with patient and son.  This included previsit chart review, lab review, study review, order entry, electronic health record documentation, patient education and discussion regarding above diagnoses and treatment plan and answered all other questions to patient and son's satisfaction    Ihor Austin, AGNP-BC  [facility] [address] [place], [place] [address]  Phone [phone] Fax [phone] Note: This document was prepared with digital dictation and possible smart phrase technology. Any transcriptional errors that result from this process are unintentional.

## 2022-10-23 ENCOUNTER — Telehealth: Payer: Self-pay

## 2022-10-30 ENCOUNTER — Telehealth: Payer: Self-pay | Admitting: Nurse Practitioner

## 2022-10-30 ENCOUNTER — Other Ambulatory Visit: Payer: Self-pay | Admitting: Nurse Practitioner

## 2022-10-30 DIAGNOSIS — R251 Tremor, unspecified: Secondary | ICD-10-CM

## 2022-10-30 MED ORDER — PROPRANOLOL HCL 40 MG PO TABS
40.0000 mg | ORAL_TABLET | Freq: Every day | ORAL | 0 refills | Status: DC
Start: 2022-10-30 — End: 2023-08-20

## 2022-10-30 NOTE — Telephone Encounter (Signed)
Would there be any way you could send in a 14-day supply of Propranolol to [facility] [place], [place] - [address]. I know a refill was sent to [facility] about 2 weeks ago. Pt is going out of town soon and would like some to get him through.

## 2022-11-01 ENCOUNTER — Other Ambulatory Visit (HOSPITAL_BASED_OUTPATIENT_CLINIC_OR_DEPARTMENT_OTHER): Payer: Self-pay

## 2022-11-01 MED ORDER — COMIRNATY 30 MCG/0.3ML IM SUSY
0.3000 mL | PREFILLED_SYRINGE | Freq: Once | INTRAMUSCULAR | 0 refills | Status: AC
  Filled 2022-11-01: qty 0.3, 1d supply, fill #0

## 2022-11-04 ENCOUNTER — Other Ambulatory Visit: Payer: Self-pay | Admitting: Nurse Practitioner

## 2022-11-04 DIAGNOSIS — J301 Allergic rhinitis due to pollen: Secondary | ICD-10-CM

## 2022-11-04 DIAGNOSIS — G8929 Other chronic pain: Secondary | ICD-10-CM

## 2022-11-13 DIAGNOSIS — I10 Essential (primary) hypertension: Secondary | ICD-10-CM | POA: Diagnosis not present

## 2022-11-13 DIAGNOSIS — I7121 Aneurysm of the ascending aorta, without rupture: Secondary | ICD-10-CM | POA: Diagnosis not present

## 2022-11-13 DIAGNOSIS — I77810 Thoracic aortic ectasia: Secondary | ICD-10-CM | POA: Diagnosis not present

## 2022-11-13 DIAGNOSIS — R0609 Other forms of dyspnea: Secondary | ICD-10-CM | POA: Diagnosis not present

## 2022-11-13 DIAGNOSIS — E782 Mixed hyperlipidemia: Secondary | ICD-10-CM | POA: Diagnosis not present

## 2022-11-14 ENCOUNTER — Telehealth: Payer: Self-pay

## 2022-11-14 DIAGNOSIS — E782 Mixed hyperlipidemia: Secondary | ICD-10-CM

## 2022-11-14 LAB — BASIC METABOLIC PANEL WITH GFR
BUN/Creatinine Ratio: 21 (ref 10–24)
BUN: 19 mg/dL (ref 8–27)
CO2: 26 mmol/L (ref 20–29)
Calcium: 10.1 mg/dL (ref 8.6–10.2)
Chloride: 104 mmol/L (ref 96–106)
Creatinine, Ser: 0.9 mg/dL (ref 0.76–1.27)
Glucose: 101 mg/dL — ABNORMAL HIGH (ref 70–99)
Potassium: 5.1 mmol/L (ref 3.5–5.2)
Sodium: 142 mmol/L (ref 134–144)
eGFR: 92 mL/min/1.73 (ref >59)

## 2022-11-14 NOTE — Telephone Encounter (Signed)
Called Patient on Mobile number left message for the to call back

## 2022-11-14 NOTE — Telephone Encounter (Signed)
-----   Message from [facility] sent at 11/14/2022  8:39 AM EDT ----- Please let Jason Wyatt know that his kidney function is normal and his electrolytes are normal. His potassium is on the higher side of normal, this has been slowly increasing over the year. Would recommend he stop any potassium supplements, electrolyte beverages or supplements as well as decrease high potassium food intake such as  bananas, squash, yogurt, white beans, sweet potatoes, leafy greens, and avocados. He should stay hydrated with water. Would recommend a repeat BMET in 2-3 weeks.

## 2022-11-15 NOTE — Telephone Encounter (Signed)
Called patient advised of below they verbalized understanding mailing out blood work tomorrow.

## 2022-11-20 ENCOUNTER — Ambulatory Visit (HOSPITAL_COMMUNITY)
Admission: RE | Admit: 2022-11-20 | Discharge: 2022-11-20 | Disposition: A | Discharge status: Home / Self Care | Payer: Medicare Other | Source: Ambulatory Visit | Attending: General Practice | Admitting: General Practice

## 2022-11-20 DIAGNOSIS — I77810 Thoracic aortic ectasia: Secondary | ICD-10-CM | POA: Insufficient documentation

## 2022-11-20 DIAGNOSIS — I719 Aortic aneurysm of unspecified site, without rupture: Secondary | ICD-10-CM | POA: Diagnosis not present

## 2022-11-20 DIAGNOSIS — N281 Cyst of kidney, acquired: Secondary | ICD-10-CM | POA: Diagnosis not present

## 2022-11-20 DIAGNOSIS — I7121 Aneurysm of the ascending aorta, without rupture: Secondary | ICD-10-CM | POA: Insufficient documentation

## 2022-11-20 DIAGNOSIS — K7689 Other specified diseases of liver: Secondary | ICD-10-CM | POA: Diagnosis not present

## 2022-11-20 DIAGNOSIS — I2510 Atherosclerotic heart disease of native coronary artery without angina pectoris: Secondary | ICD-10-CM | POA: Diagnosis not present

## 2022-11-20 MED ORDER — IOHEXOL 350 MG/ML SOLN
75.0000 mL | Freq: Once | INTRAVENOUS | Status: AC | PRN
  Administered 2022-11-20: 75 mL via INTRAVENOUS

## 2022-12-04 ENCOUNTER — Telehealth: Payer: Self-pay

## 2022-12-04 DIAGNOSIS — E782 Mixed hyperlipidemia: Secondary | ICD-10-CM | POA: Diagnosis not present

## 2022-12-04 LAB — BASIC METABOLIC PANEL
BUN/Creatinine Ratio: 17 (ref 10–24)
BUN: 15 mg/dL (ref 8–27)
CO2: 25 mmol/L (ref 20–29)
Calcium: 9.7 mg/dL (ref 8.6–10.2)
Chloride: 102 mmol/L (ref 96–106)
Creatinine, Ser: 0.87 mg/dL (ref 0.76–1.27)
Glucose: 123 mg/dL — ABNORMAL HIGH (ref 70–99)
Potassium: 4.7 mmol/L (ref 3.5–5.2)
Sodium: 143 mmol/L (ref 134–144)
eGFR: 93 mL/min/{1.73_m2} (ref >59)

## 2022-12-04 NOTE — Telephone Encounter (Signed)
-----   Message from [facility] sent at 12/04/2022 12:36 PM EST ----- Please let [name] know that CT scan of his chest and aorta showed stable dilation of his ascending aorta, no significant change compared to prior imaging. There is note of some early signs of possible liver changes, recommend follow up with PCP for ongoing monitoring. We will likely repeat a CT next year to monitor your aorta.   Pollie Poma please send results to PCP for review.

## 2022-12-04 NOTE — Telephone Encounter (Signed)
Called patient advised of below they verbalized understanding.

## 2022-12-05 ENCOUNTER — Telehealth: Payer: Self-pay | Admitting: Nurse Practitioner

## 2022-12-05 NOTE — Telephone Encounter (Signed)
Patient went to neuropathy this morning for the nerves in his hands and feet. The doctor told patient the medication carbidopa-levodopa is working against the therapy they are doing for hands and feet. Wondering if there is an alternative or would the doctor at the neuropathy office have to prescribe something else?

## 2022-12-05 NOTE — Telephone Encounter (Signed)
Spoke with patient and he has decided to leave everything as is until his CPE 01/11/23. He and Annabelle Harman can discuss medication change at that visit. He is also to bring the papers that Alined has been giving him after his sessions.

## 2022-12-05 NOTE — Telephone Encounter (Signed)
Please advise I would like him to discuss neuropathy medication with his neurologist

## 2022-12-07 ENCOUNTER — Ambulatory Visit: Payer: Medicare Other | Admitting: Podiatry

## 2022-12-07 ENCOUNTER — Encounter: Payer: Self-pay | Admitting: Podiatry

## 2022-12-07 DIAGNOSIS — M79676 Pain in unspecified toe(s): Secondary | ICD-10-CM | POA: Diagnosis not present

## 2022-12-07 DIAGNOSIS — D2371 Other benign neoplasm of skin of right lower limb, including hip: Secondary | ICD-10-CM | POA: Diagnosis not present

## 2022-12-07 DIAGNOSIS — E1142 Type 2 diabetes mellitus with diabetic polyneuropathy: Secondary | ICD-10-CM | POA: Diagnosis not present

## 2022-12-07 DIAGNOSIS — D2372 Other benign neoplasm of skin of left lower limb, including hip: Secondary | ICD-10-CM

## 2022-12-07 DIAGNOSIS — B351 Tinea unguium: Secondary | ICD-10-CM

## 2022-12-07 NOTE — Progress Notes (Signed)
Presents today chief complaint of painful elongated toenails and calluses bilaterally.  Objective: Vital signs stable alert oriented x 3.  Pulses are palpable.  Neuropathy is unchanged no open lesions or wounds.  Multiple thick calluses with fissures.  Toenails are long thick yellow dystrophic onychomycotic there are no open lesions or wounds.  Assessment: Pain in limb secondary to onychomycosis diabetic peripheral neuropathy and benign skin lesions with seizures.  Plan: Debridement of all benign skin lesions fissures and nails.  Follow-up with him in 3 months.

## 2022-12-19 ENCOUNTER — Encounter: Payer: Medicare Other | Admitting: Nurse Practitioner

## 2022-12-20 DIAGNOSIS — M4326 Fusion of spine, lumbar region: Secondary | ICD-10-CM | POA: Diagnosis not present

## 2022-12-20 DIAGNOSIS — G894 Chronic pain syndrome: Secondary | ICD-10-CM | POA: Diagnosis not present

## 2022-12-20 DIAGNOSIS — M791 Myalgia, unspecified site: Secondary | ICD-10-CM | POA: Diagnosis not present

## 2022-12-20 DIAGNOSIS — M5106 Intervertebral disc disorders with myelopathy, lumbar region: Secondary | ICD-10-CM | POA: Diagnosis not present

## 2022-12-20 DIAGNOSIS — M5412 Radiculopathy, cervical region: Secondary | ICD-10-CM | POA: Diagnosis not present

## 2023-01-08 NOTE — Progress Notes (Unsigned)
Patient ID: Jason Wyatt, male   DOB: 1955-12-12, 67 y.o.   MRN: [id number]  Complete Physical Exam  Assessment and Plan  Diagnoses and all orders for this visit:  Encounter for CPE Due Yearly Continue to monitor mood, grieving over death of wife but doing well on Cymbalta  Essential hypertension Continue medication: olmesartan 40 mg every day and propranolol 40 mg every day Monitor blood pressure at home more closely; call if consistently over 130/80 Continue DASH diet.   Reminder to go to the ER if any CP, SOB, nausea, dizziness, severe HA, changes vision/speech, left arm numbness and tingling and jaw pain. CBC  Atherosclerotic peripheral vascular disease (HCC) Control blood pressure, cholesterol, glucose, increase exercise.   Other Chronic Back pain Continue to follow with Dr. Belinda Block Continue Meloxicam and Robaxin as needed Encouraged continued weight loss  Obesity hypoventilation syndrome/OSA with CPAP Continue CPAP, weight loss encouraged  Type 2 diabetes mellitus with CKD [facility]) Education: Reviewed 'ABCs' of diabetes management (respective goals in parentheses):  A1C (<7), blood pressure (<130/80), and cholesterol (LDL <70) Eye Exam yearly and Dental Exam every 6 months. Dietary recommendations Physical Activity recommendations Continue Metformin 500 mg 2 tabs BID and Trulicity 4.5 mg SQ QW - check lipid panel, A1C, CBC, CMP  CKD II associated with T2DM (HCC) Increase fluids, avoid NSAIDS, control sugars and blood pressure, will monitor - CMP/GFR  Hyperlipidemia associated with T2DM (HCC) Continue medications: Atorvastatin 20 mg every day  Continue low cholesterol diet and exercise.  Check lipid panel.   DM type 2 with diabetic peripheral neuropathy (HCC) Check feet at home daily, call with changes/concerns Followed closely by podiatry q55m Emphasized need for improved glucose control -  Continue meds: Sinemet BID and Gabapentin 600 mg TID  Primary  osteoarthritis of left knee Followed by ortho  CKD (chronic kidney disease), stage II Increase fluids, avoid NSAIDS, monitor sugars, will monitor Routine urine with reflex microscopic Microalbumin/creatinine urine ratio  RLS Continue meds: Sinemet BID and Gabapentin 600 mg TID, increase walking, monitor iron/B12  Morbid obesity (BMI 41+) Long discussion about weight loss, diet, and exercise Recommended diet heavy in fruits and veggies and low in animal meats, cheeses, and dairy products, appropriate calorie intake Discussed appropriate weight for height  Discussed restricting portions of starches, increase high fiber, information given Follow up at next visit  Medication management CBC, CMP/GFR, magnesium  Idiopathic chronic gout without tophus, unspecified site Continue allopurinol Diet discussed Check uric acid as needed  Hx of prostate cancer/BPH with LUTS S/p robotic excision, followed by Dr. Marlou Porch, Flomax for residual LUTS PSA   Vitamin D Deficiency Check Vitamin D level Continue supplementation to maintain level between 60-100  Decreased iron stores Continue to follow with Dr. Truett Perna Continue ferrous sulfate 325 mg 3 tabs daily   Medication management -     CBC with Differential/Platelet -     COMPLETE METABOLIC PANEL WITH GFR -     Magnesium -     Lipid panel -     TSH -     Hemoglobin A1C w/out eAG -     VITAMIN D 25 Hydroxy (Vit-D Deficiency, Fractures) -     EKG 12-Lead -     Urinalysis, Routine w reflex microscopic -     Microalbumin / creatinine urine ratio -     PSA -     Iron, TIBC and Ferritin Panel  Screening for hematuria or proteinuria -     Urinalysis, Routine w reflex microscopic -  Microalbumin / creatinine urine ratio  Screening for thyroid disorder -     TSH  Screening for ischemic heart disease -     EKG 12-Lead  Flu vaccine need -     Flu vaccine HIGH DOSE PF(Fluzone Trivalent)      Future Appointments  Date Time  Provider Department Center  03/13/2023  8:15 AM Louann, Annye Rusk, [place] [facility]  04/13/2023  9:30 AM Raynelle Dick, NP [facility] None  10/25/2023  9:30 AM Dohmeier, Porfirio Mylar, MD [facility] None  01/11/2024  9:00 AM Raynelle Dick, NP [facility] None    HPI 67 y.o. male  presents for CPE and 3 month follow up with hypertension, hyperlipidemia, diabetes and vitamin D deficiency.  He is a widower, wife died last month 12/12/2021. 2 sons, no grandchildren. Enjoys his dog. He is semi retired Optician, dispensing.   He is on CPAP for OSA/obesity hypoventilation syndrome and endorses 100% compliance and restorative sleep. Follow with Dr. Vickey Huger  He take acyclovir 1 tab daily for cold sore suppression.   He has imbalance attributed to advanced peripheral neuropathy, does ambulate without cane if needed. Son drives him when needed. Follows with podiatry Dr. Al Corpus for diabetic neuropathy.  He is using a nerve plate on his feet TID and warm bath with electrodes Bid and then hands, also doing a bright light therapy through aligned therapy [phone]. He believes it is helping somewhat. He is on Cymbalta 60 mg every day and Gabapentin 600 mg BID  He has chronic low back pain.  Follows with Dr. Noel Gerold and was receiving injections every 3 months. Symptoms are controlled with this.   He is on sinemet BID for RLS. Also takes gabapentin 600 mg BID .   He is taking flomax in the AM per urology for sense of incomplete bladder emptying; hx of prostate cancer and robotic excision in 2014 and followed by Dr. Marlou Porch at [facility]. PSAs have remained <0.1.   he has a diagnosis of GERD which is currently well managed by pantoprazole 40 mg  BMI is Body mass index is 36.84 kg/m., he has not been working on diet and exercise due back/joint pain.  Wt Readings from Last 3 Encounters:  01/11/23 271 lb 9.6 oz (123.2 kg)  10/19/22 271 lb 12.8 oz (123.3 kg)  10/06/22 273 lb 6.4 oz (124 kg)   His blood pressure has  been controlled at home on Propranolol 40 mg every day, Olmesartan 40 mg every day , today their BP is BP: 124/68 BP Readings from Last 3 Encounters:  01/11/23 124/68  10/19/22 (!) 149/89  10/06/22 124/72   He does workout, walks daily with dog. He denies chest pain, shortness of breath, dizziness.  Last evaluated by Cardiology 09/19/2022:  DOE- chronic stable.  Underwent echocardiogram 09/21/2021 which showed normal LVEF and G1 DD.  Details above.  Coronary CTA 9/23 showed mild nonobstructive plaque in his LAD. Increase physical activity as tolerated Heart healthy low-sodium diet Continue weight loss   Chest discomfort-denies recent episodes of arm neck back or chest discomfort.  Reassuring coronary CTA. No plans for ischemic evaluation at this time. Increase physical activity as tolerated   Ascending thoracic aortic aneurysm- Noted at 4 cm on coronary CTA. Recommended to have annual imaging. Complete CTA for monitoring.     Aortic dilatation- Mild dilatation of the aortic root measuring 39 mm and mild dilatation of the ascending aorta measuring 41 mm noted on echocardiogram in 09/2021.    Essential  hypertension-BP today 122/74. Maintain blood pressure log Continue current medical therapy Heart healthy low-sodium diet   Coronary artery disease-nonobstructive plaque noted on coronary CTA. Stable with no anginal symptoms. No indication for ischemic evaluation.   EKG today shows sinus rhythm with 1st degree AV block, LAD, RBBB, unchanged compared to EKG from year prior.  Continue aspirin, atorvastatin Heart healthy low-sodium diet   Type 2 diabetes-A1c 6.8 on 6/24 Continue metformin Follows with PCP   Disposition: Follow-up with Dr.Berry or me in 12 months.  He was evaluated by Dr. Myrle Sheng for anemia- last visit was 09/29/22- he was recommended to continue iron supplementation. He has been discharged from hematology. He has been taking 1 iron daily.    He is on cholesterol medication,  Atorvastatin 20 mg every other day denies myalgias. His cholesterol is not at goal. The cholesterol last visit was:   Lab Results  Component Value Date   CHOL 102 10/06/2022   HDL 41 10/06/2022   LDLCALC 41 10/06/2022   TRIG 118 10/06/2022   CHOLHDL 2.5 10/06/2022    He has been working on diet  for Type 2 diabetes on metformin 2000 mg daily, and trulicity 4.5 mg weekly, and denies foot ulcerations, increased appetite, nausea, polydipsia, polyuria, visual disturbances, vomiting and weight loss.  Neuropathy follows with podiatry, on Gabapentin 600 mg BID Diabetic eye - 07/2022 with Dr. Harriette Bouillon. Cataracts both eye removed this year. He has been checking fasting glucose, log demonstrates improved from 120's normally,spikes if he snacks late in the evening  Last A1C in the office was:  Lab Results  Component Value Date   HGBA1C 6.7 (H) 10/06/2022   He has CKD II associated with T2DM monitored at this office. On Olmesartan. Last GFR:  Lab Results  Component Value Date   EGFR 93 12/04/2022    Patient is on Vitamin D supplement, taking 09811 IU every other day    Lab Results  Component Value Date   VD25OH 115 (H) 07/06/2022     Patient is on allopurinol for gout and does not report a recent flare.  Lab Results  Component Value Date   LABURIC 5.2 09/06/2021   Last PSA:  Lab Results  Component Value Date   PSA <0.04 12/16/2021   PSA <0.04 11/29/2020   PSA <0.04 11/27/2019    He is taking Ferrous sulfate 325mg  1 in am 2 in PM. Lab Results  Component Value Date   IRON 64 07/06/2021   TIBC 384 07/06/2021   FERRITIN 30 09/29/2022      Current Outpatient Medications  Medication Instructions   acyclovir (ZOVIRAX) 400 MG tablet TAKE 1 TABLET BY MOUTH DAILY AS  NEEDED FOR COLD SORES   allopurinol (ZYLOPRIM) 300 MG tablet TAKE 1 TABLET BY MOUTH  DAILY FOR GOUT PREVENTION   aspirin 81 mg, Daily   atorvastatin (LIPITOR) 20 mg, Oral, Daily   Blood Glucose Monitoring Suppl (ONE  TOUCH ULTRA 2) w/Device KIT USE AS DIRECTED   carbidopa-levodopa (SINEMET IR) 25-250 MG tablet TAKE 1 TABLET BY MOUTH  TWICE DAILY FOR RESTLESS  LEGS   Cyanocobalamin (B-12) 2500 MCG SUBL Every other day   dextromethorphan-guaiFENesin (MUCINEX DM) 30-600 MG 12hr tablet 1 tablet, 2 times daily PRN   DULoxetine (CYMBALTA) 60 mg, Oral, Daily at bedtime   ferrous sulfate 325 mg, Daily with breakfast   fexofenadine (ALLEGRA) 180 mg, Daily PRN   fluticasone (FLONASE) 50 MCG/ACT nasal spray USE 2 SPRAYS IN BOTH NOSTRILS  ONCE DAILY  gabapentin (NEURONTIN) 600 mg, Oral, 2 times daily   Lancets (ONETOUCH DELICA PLUS LANCET33G) MISC USE WITH METER TO CHECK BLOOD  SUGAR ONCE DAILY   Magnesium 800 mg, Nightly   meloxicam (MOBIC) 7.5 MG tablet Take 1 tablet Daily  with Food  is needed for Pain   metFORMIN (GLUCOPHAGE-XR) 500 MG 24 hr tablet TAKE 2 TABLETS BY MOUTH  TWICE DAILY WITH MEALS FOR  DIABETES   methocarbamol (ROBAXIN) 500 mg, 3 times daily PRN   Multiple Vitamin (MULTIVITAMIN) tablet 1 tablet, Daily   Naphazoline-Pheniramine (EYE ALLERGY RELIEF OP) Apply to eye.   olmesartan (BENICAR) 40 MG tablet TAKE 1 TABLET BY MOUTH AT NIGHT  FOR BLOOD PRESSURE AND DIABETIC  KIDNEY PROTECTION   Omega 3 1200 MG CAPS 1 capsule, Daily   ONETOUCH ULTRA test strip USE TO CHECK BLOOD SUGAR DAILY   pantoprazole (PROTONIX) 40 MG tablet TAKE 1 TABLET DAILY TO  PREVENT HEARTBURN &  INDIGESTION   propranolol (INDERAL) 40 mg, Oral, Daily   tamsulosin (FLOMAX) 0.4 MG CAPS capsule TAKE 2 CAPSULES BY MOUTH AT  BEDTIME FOR PROSTATE   TRULICITY 4.5 MG/0.5ML SOPN Inject 1 pen  (4.5 mg) into Skin every 7 days   Vitamin D, Ergocalciferol, (DRISDOL) 1.25 MG (50000 UNIT) CAPS capsule TAKE 1 CAPSULE BY MOUTH 2 DAYS  WEEKLY FOR VITAMIN D DEFICIENCY     Medical History:  Past Medical History:  Diagnosis Date   Acute meniscal tear of left knee    Allergic rhinitis    Arthritis     back   Borderline glaucoma    Chronic back  pain    stenosis   CKD (chronic kidney disease), stage II    Complication of anesthesia cervical fusion surgery 06/2010   "woke up next day w/ventilator on" told due to OSA   GERD (gastroesophageal reflux disease)    takes Prevacid daily   Gout    takes Allopurinol daily and Colchicine if needed   History of adenomatous polyp of colon    tubular adenoma's   History of Barrett's esophagus    History of hiatal hernia    Hyperlipidemia    Hypertension    Incomplete right bundle branch block (RBBB)    OA (osteoarthritis)    knee   OSA on CPAP    Peripheral neuropathy    Prostate cancer [facility]) urologist-  dr Laverle Patter   dx 2013  s/p  radical non-nerve sparing prostatectomy 2014--  Stage pT2cNx,  PSA 3.48,  Gleason 3+3,  vol 55cc/  current PSA  0.01 (Aug 2016)   Rupture of Achilles tendon    Thyroid nodule    left side   Type II diabetes mellitus (HCC)    Urinary frequency    takes Flomax daily   Urine incontinence    Wears glasses    Allergies Allergies  Allergen Reactions   Fenofibrate Itching and Rash   Flaxseed Oil Rash   Flaxseed [Flaxseed (Linseed)] Rash   Lyrica [Pregabalin] Itching and Rash   Niaspan [Niacin Er (Antihyperlipidemic)] Rash   Preventative Medicine Immunization History  Administered Date(s) Administered   Influenza Inj Mdck Quad With Preservative 11/22/2016, 12/04/2017   Influenza Split 10/17/2013, 10/29/2014   Influenza, High Dose Seasonal PF 11/26/2018, 11/27/2019, 11/29/2020, 12/06/2021, 01/11/2023   Influenza, Seasonal, Injecte, Preservative Fre 11/29/2015   Influenza-Unspecified 11/04/2012   PFIZER(Purple Top)SARS-COV-2 Vaccination 03/10/2019, 03/31/2019, 11/06/2019   PPD Test 01/16/2006, 04/07/2013   Pfizer Covid-19 Vaccine Bivalent Booster 76yrs & up 02/16/2021  Pfizer(Comirnaty)Fall Seasonal Vaccine 12 years and older 11/01/2022   Pneumococcal Conjugate-13 07/29/2018   Pneumococcal Polysaccharide-23 04/07/2013, 11/22/2016    Pneumococcal-Unspecified 09/17/2010   Td 01/17/2011, 03/10/2021   Td (Adult), 2 Lf Tetanus Toxid, Preservative Free 01/17/2011   Zoster Recombinant(Shingrix) 02/16/2021   Zoster, Live 01/16/2005   Health Maintenance  Topic Date Due   Zoster Vaccines- Shingrix (2 of 2) 04/13/2021   Pneumonia Vaccine 32+ Years old (3 of 3 - PPSV23 or PCV20) 11/22/2021   Diabetic kidney evaluation - Urine ACR  12/17/2022   COVID-19 Vaccine (6 - 2024-25 season) 12/27/2022   FOOT EXAM  12/17/2022   OPHTHALMOLOGY EXAM  12/30/2022   Medicare Annual Wellness (AWV)  03/22/2023   HEMOGLOBIN A1C  04/05/2023   Diabetic kidney evaluation - eGFR measurement  12/04/2023   Colonoscopy  11/24/2026   DTaP/Tdap/Td (3 - Tdap) 03/11/2031   INFLUENZA VACCINE  Completed   Hepatitis C Screening  Completed   HPV VACCINES  Aged Out     Vision: Dr. Harriette Bouillon, 08/08/22 Dental: Dr. [id number]  Patient Care Team: Lucky Cowboy, MD as PCP - General (Internal Medicine) Runell Gess, MD as PCP - Cardiology (Cardiology) Elinor Parkinson, DPM as Consulting Physician (Podiatry) Glenford Peers, OD as Referring Physician (Optometry) [place], Peter M, MD as Consulting Physician (Cardiology) Bernette Redbird, MD as Consulting Physician (Gastroenterology) Jene Every, MD as Consulting Physician (Orthopedic Surgery) Crist Fat, MD as Attending Physician (Urology) Sundra Aland, [facility] (Inactive) as Pharmacist (Pharmacist)   SURGICAL HISTORY He  has a past surgical history that includes Posterior fusion cervical spine (07-05-2010); Esophagogastroduodenoscopy (09/26/2011); Robot assisted laparoscopic radical prostatectomy (02/07/2012); wisdom teeth extracted  (1982); left and right heart catheterization with coronary angiogram (N/A, 08/25/2011); Anal fissure repair (06-05-2002); Cardiovascular stress test (01-01-2008); transthoracic echocardiogram (08-03-2011); Posterior lumbar fusion (05-18-2014); Uvulopalatopharyngoplasty  (uppp)/tonsillectomy/septoplasty (1990's); Knee arthroscopy with medial menisectomy (Left, 12/31/2014); Cardiac catheterization; Back surgery; Total knee arthroplasty (Left, 08/05/2015); Gastroc recession extremity (Left, 06/27/2017); Achilles tendon surgery (Left, 06/27/2017); Tendon transfer (Left, 06/27/2017); Carpal tunnel release (Left, 03/27/2018); and Carpal tunnel release (Right, 06/2018). FAMILY HISTORY His family history includes Cancer in his father; Dementia in his mother; Heart attack in his maternal grandmother; Hyperlipidemia in his sister; Hypertension in his mother, paternal grandfather, and sister; Osteoarthritis in his mother; Osteoporosis in his mother; Sleep apnea in his son and son; Stroke in his maternal grandmother. SOCIAL HISTORY He  reports that he has never smoked. He has never used smokeless tobacco. He reports that he does not drink alcohol and does not use drugs.      Review of Systems:  Review of Systems  Constitutional:  Negative for chills, diaphoresis, fever, malaise/fatigue and weight loss.  HENT:  Negative for congestion, ear pain, hearing loss, sore throat and tinnitus.   Eyes: Negative.  Negative for blurred vision and double vision.  Respiratory:  Positive for shortness of breath (on exertion). Negative for cough, sputum production and wheezing.   Cardiovascular:  Negative for chest pain, palpitations, orthopnea, claudication, leg swelling and PND.  Gastrointestinal:  Negative for abdominal pain, blood in stool, constipation, diarrhea, heartburn, melena, nausea and vomiting.  Genitourinary:  Positive for urgency.       Nocturia and will occasionally lose urine with coughing or laughing.   Musculoskeletal:  Positive for back pain and joint pain (shoulders, knees, hips). Negative for falls and myalgias.  Skin: Negative.  Negative for rash.  Neurological:  Positive for tingling (bil fingers) and sensory change (chronic numbness of bil  feet). Negative for  dizziness, loss of consciousness, weakness and headaches.  Endo/Heme/Allergies:  Negative for polydipsia.  Psychiatric/Behavioral: Negative.  Negative for depression, memory loss, substance abuse and suicidal ideas. The patient is not nervous/anxious and does not have insomnia.   All other systems reviewed and are negative.   Physical Exam: BP 124/68   Pulse 96   Temp (!) 97.3 F (36.3 C)   Ht 6' (1.829 m)   Wt 271 lb 9.6 oz (123.2 kg)   SpO2 96%   BMI 36.84 kg/m  Wt Readings from Last 3 Encounters:  01/11/23 271 lb 9.6 oz (123.2 kg)  10/19/22 271 lb 12.8 oz (123.3 kg)  10/06/22 273 lb 6.4 oz (124 kg)   General Appearance: Pleasant obese male in no apparent distress Eyes: PERRLA, EOMs, conjunctiva no swelling or erythema ENT/Mouth: Ear canals clear with no erythema, swelling, or discharge.  TMs normal bilaterally, oropharynx clear, moist, with no exudate.   Neck: Supple, thyroid normal, no JVD, no cervical adenopathy.  Respiratory: Respiratory effort normal, breath sounds clear A&P, no wheeze, rhonchi or rales noted.  No retractions, no accessory muscle usage Cardio: RRR with no MRGs. No edema noted Abdomen: Soft, + BS, morbidly obese abdomen limiting exam.  Non tender, no guarding, rebound, hernias, he has mild ventral hernia with bearing down. Musculoskeletal: Full ROM, 5/5 strength, antalgic gait, uses cane Skin: Warm, dry without rashes, lesions, ecchymosis. Calluses to bilateral feet without ulcers or wounds. Neuro: Awake and oriented X 3, Cranial nerves intact. No cerebellar symptoms. Decreased sensation to feet bilaterally and lower legs Psych: normal affect, Insight and Judgment appropriate.  GU: defer to urology, declines today   EKG: NSR, AV Block I, RBBB- followed by cardiology- stable Underwent echocardiogram 09/21/2021 which showed normal LVEF and G1 DD. Coronary CTA 9/23 showed mild nonobstructive plaque in his LAD.     Raynelle Dick, NP 10:34 AM Ginette Otto  [facility]

## 2023-01-11 ENCOUNTER — Ambulatory Visit (INDEPENDENT_AMBULATORY_CARE_PROVIDER_SITE_OTHER): Payer: Medicare Other | Admitting: Nurse Practitioner

## 2023-01-11 ENCOUNTER — Encounter: Payer: Self-pay | Admitting: Nurse Practitioner

## 2023-01-11 VITALS — BP 124/68 | HR 96 | Temp 97.3°F | Ht 72.0 in | Wt 271.6 lb

## 2023-01-11 DIAGNOSIS — E1142 Type 2 diabetes mellitus with diabetic polyneuropathy: Secondary | ICD-10-CM

## 2023-01-11 DIAGNOSIS — Z79899 Other long term (current) drug therapy: Secondary | ICD-10-CM

## 2023-01-11 DIAGNOSIS — Z8546 Personal history of malignant neoplasm of prostate: Secondary | ICD-10-CM

## 2023-01-11 DIAGNOSIS — G2581 Restless legs syndrome: Secondary | ICD-10-CM

## 2023-01-11 DIAGNOSIS — F321 Major depressive disorder, single episode, moderate: Secondary | ICD-10-CM

## 2023-01-11 DIAGNOSIS — Z136 Encounter for screening for cardiovascular disorders: Secondary | ICD-10-CM | POA: Diagnosis not present

## 2023-01-11 DIAGNOSIS — Z1329 Encounter for screening for other suspected endocrine disorder: Secondary | ICD-10-CM

## 2023-01-11 DIAGNOSIS — R2681 Unsteadiness on feet: Secondary | ICD-10-CM

## 2023-01-11 DIAGNOSIS — M1A00X0 Idiopathic chronic gout, unspecified site, without tophus (tophi): Secondary | ICD-10-CM

## 2023-01-11 DIAGNOSIS — E1122 Type 2 diabetes mellitus with diabetic chronic kidney disease: Secondary | ICD-10-CM

## 2023-01-11 DIAGNOSIS — Z23 Encounter for immunization: Secondary | ICD-10-CM

## 2023-01-11 DIAGNOSIS — E1169 Type 2 diabetes mellitus with other specified complication: Secondary | ICD-10-CM

## 2023-01-11 DIAGNOSIS — E559 Vitamin D deficiency, unspecified: Secondary | ICD-10-CM

## 2023-01-11 DIAGNOSIS — I10 Essential (primary) hypertension: Secondary | ICD-10-CM

## 2023-01-11 DIAGNOSIS — R790 Abnormal level of blood mineral: Secondary | ICD-10-CM

## 2023-01-11 DIAGNOSIS — Z0000 Encounter for general adult medical examination without abnormal findings: Secondary | ICD-10-CM

## 2023-01-11 DIAGNOSIS — N138 Other obstructive and reflux uropathy: Secondary | ICD-10-CM

## 2023-01-11 DIAGNOSIS — E785 Hyperlipidemia, unspecified: Secondary | ICD-10-CM | POA: Diagnosis not present

## 2023-01-11 DIAGNOSIS — E662 Morbid (severe) obesity with alveolar hypoventilation: Secondary | ICD-10-CM

## 2023-01-11 DIAGNOSIS — M1712 Unilateral primary osteoarthritis, left knee: Secondary | ICD-10-CM

## 2023-01-11 DIAGNOSIS — Z1389 Encounter for screening for other disorder: Secondary | ICD-10-CM

## 2023-01-11 DIAGNOSIS — G4733 Obstructive sleep apnea (adult) (pediatric): Secondary | ICD-10-CM

## 2023-01-11 DIAGNOSIS — I70209 Unspecified atherosclerosis of native arteries of extremities, unspecified extremity: Secondary | ICD-10-CM

## 2023-01-11 DIAGNOSIS — Z0001 Encounter for general adult medical examination with abnormal findings: Secondary | ICD-10-CM

## 2023-01-11 MED ORDER — OLMESARTAN MEDOXOMIL 40 MG PO TABS: ORAL_TABLET | ORAL | 3 refills | Status: DC

## 2023-01-11 NOTE — Patient Instructions (Signed)

## 2023-01-12 ENCOUNTER — Other Ambulatory Visit: Payer: Self-pay | Admitting: Nurse Practitioner

## 2023-01-12 LAB — MICROALBUMIN / CREATININE URINE RATIO
Creatinine, Urine: 91 mg/dL (ref 20–320)
Microalb Creat Ratio: 51 mg/g{creat} — ABNORMAL HIGH (ref <30)
Microalb, Ur: 4.6 mg/dL

## 2023-01-12 LAB — COMPLETE METABOLIC PANEL WITH GFR
AG Ratio: 1.8 (calc) (ref 1.0–2.5)
ALT: 12 U/L (ref 9–46)
AST: 14 U/L (ref 10–35)
Albumin: 4.3 g/dL (ref 3.6–5.1)
Alkaline phosphatase (APISO): 79 U/L (ref 35–144)
BUN: 19 mg/dL (ref 7–25)
CO2: 25 mmol/L (ref 20–32)
Calcium: 10.1 mg/dL (ref 8.6–10.3)
Chloride: 103 mmol/L (ref 98–110)
Creat: 0.93 mg/dL (ref 0.70–1.35)
Globulin: 2.4 g/dL (ref 1.9–3.7)
Glucose, Bld: 121 mg/dL — ABNORMAL HIGH (ref 65–99)
Potassium: 4.8 mmol/L (ref 3.5–5.3)
Sodium: 139 mmol/L (ref 135–146)
Total Bilirubin: 0.6 mg/dL (ref 0.2–1.2)
Total Protein: 6.7 g/dL (ref 6.1–8.1)
eGFR: 89 mL/min/{1.73_m2} (ref >=60)

## 2023-01-12 LAB — CBC WITH DIFFERENTIAL/PLATELET
Absolute Lymphocytes: 1642 {cells}/uL (ref 850–3900)
Absolute Monocytes: 624 {cells}/uL (ref 200–950)
Basophils Absolute: 67 {cells}/uL (ref 0–200)
Basophils Relative: 0.7 %
Eosinophils Absolute: 96 {cells}/uL (ref 15–500)
Eosinophils Relative: 1 %
HCT: 41.4 % (ref 38.5–50.0)
Hemoglobin: 14.5 g/dL (ref 13.2–17.1)
MCH: 32.8 pg (ref 27.0–33.0)
MCHC: 35 g/dL (ref 32.0–36.0)
MCV: 93.7 fL (ref 80.0–100.0)
MPV: 10.2 fL (ref 7.5–12.5)
Monocytes Relative: 6.5 %
Neutro Abs: 7171 {cells}/uL (ref 1500–7800)
Neutrophils Relative %: 74.7 %
Platelets: 242 10*3/uL (ref 140–400)
RBC: 4.42 10*6/uL (ref 4.20–5.80)
RDW: 13 % (ref 11.0–15.0)
Total Lymphocyte: 17.1 %
WBC: 9.6 10*3/uL (ref 3.8–10.8)

## 2023-01-12 LAB — LIPID PANEL
Cholesterol: 103 mg/dL (ref <200)
HDL: 40 mg/dL (ref >=40)
LDL Cholesterol (Calc): 42 mg/dL
Non-HDL Cholesterol (Calc): 63 mg/dL (ref <130)
Total CHOL/HDL Ratio: 2.6 (calc) (ref <5.0)
Triglycerides: 131 mg/dL (ref <150)

## 2023-01-12 LAB — IRON,TIBC AND FERRITIN PANEL
%SAT: 25 % (ref 20–48)
Ferritin: 21 ng/mL — ABNORMAL LOW (ref 24–380)
Iron: 84 ug/dL (ref 50–180)
TIBC: 338 ug/dL (ref 250–425)

## 2023-01-12 LAB — TSH: TSH: 2.33 m[IU]/L (ref 0.40–4.50)

## 2023-01-12 LAB — PSA: PSA: <0.04 ng/mL (ref <=4.00)

## 2023-01-12 LAB — MAGNESIUM: Magnesium: 1.8 mg/dL (ref 1.5–2.5)

## 2023-01-12 LAB — VITAMIN D 25 HYDROXY (VIT D DEFICIENCY, FRACTURES): Vit D, 25-Hydroxy: 126 ng/mL — ABNORMAL HIGH (ref 30–100)

## 2023-01-12 LAB — URINALYSIS, ROUTINE W REFLEX MICROSCOPIC
Bilirubin Urine: NEGATIVE
Glucose, UA: NEGATIVE
Hgb urine dipstick: NEGATIVE
Ketones, ur: NEGATIVE
Leukocytes,Ua: NEGATIVE
Nitrite: NEGATIVE
Protein, ur: NEGATIVE
Specific Gravity, Urine: 1.018 (ref 1.001–1.035)
pH: 7.5 (ref 5.0–8.0)

## 2023-01-12 LAB — HEMOGLOBIN A1C W/OUT EAG: Hgb A1c MFr Bld: 6.5 %{Hb} — ABNORMAL HIGH (ref <5.7)

## 2023-01-21 ENCOUNTER — Other Ambulatory Visit: Payer: Self-pay | Admitting: Nurse Practitioner

## 2023-01-21 DIAGNOSIS — N138 Other obstructive and reflux uropathy: Secondary | ICD-10-CM

## 2023-01-21 DIAGNOSIS — E1122 Type 2 diabetes mellitus with diabetic chronic kidney disease: Secondary | ICD-10-CM

## 2023-01-22 ENCOUNTER — Other Ambulatory Visit: Payer: Self-pay

## 2023-01-22 DIAGNOSIS — E1169 Type 2 diabetes mellitus with other specified complication: Secondary | ICD-10-CM

## 2023-01-22 MED ORDER — ATORVASTATIN CALCIUM 20 MG PO TABS: 20.0000 mg | ORAL_TABLET | Freq: Every day | ORAL | 0 refills | Status: DC

## 2023-01-25 ENCOUNTER — Telehealth: Payer: Self-pay | Admitting: Podiatry

## 2023-01-25 NOTE — Telephone Encounter (Signed)
 Patient called and stated that Dr. Verta is usually the one who cuts and trims the nails/callus on his feet. He stated he has some concern about a possible infection given that he is a diabetic and he has neuropathy. Can you please call him back and address some concerns he may have. Thanks

## 2023-01-26 ENCOUNTER — Ambulatory Visit: Payer: Medicare Other | Admitting: Podiatry

## 2023-01-26 ENCOUNTER — Encounter: Payer: Self-pay | Admitting: Podiatry

## 2023-01-26 ENCOUNTER — Ambulatory Visit (INDEPENDENT_AMBULATORY_CARE_PROVIDER_SITE_OTHER): Payer: Medicare Other

## 2023-01-26 DIAGNOSIS — L97512 Non-pressure chronic ulcer of other part of right foot with fat layer exposed: Secondary | ICD-10-CM | POA: Diagnosis not present

## 2023-01-26 DIAGNOSIS — M778 Other enthesopathies, not elsewhere classified: Secondary | ICD-10-CM

## 2023-01-26 DIAGNOSIS — R52 Pain, unspecified: Secondary | ICD-10-CM

## 2023-01-26 MED ORDER — MUPIROCIN 2 % EX OINT
1.0000 | TOPICAL_OINTMENT | Freq: Two times a day (BID) | CUTANEOUS | 2 refills | Status: AC
Start: 2023-01-26 — End: ?

## 2023-01-26 NOTE — Patient Instructions (Signed)
 Clean the wound with saline, dry well. Apply a small amount of the antibiotic ointment (mupirocin  ointment) and cover with a bandage. Change twice a day.   Monitor for any signs/symptoms of infection. Call the office immediately if any occur or go directly to the emergency room. Call with any questions/concerns.

## 2023-01-29 NOTE — Progress Notes (Signed)
 Subjective: Chief Complaint  Patient presents with   Foot Pain    RM#14 Right foot callus has a split in it concerns of infection .   68 year old male presents the office today with above concerns, which are new.  He states that calluses formed and needs to be scraped.  He has not seen any drainage or pus or increasing swelling or redness.  Does not report any fevers or chills.  Objective: AAO x3, NAD DP/PT pulses palpable bilaterally, CRT less than 3 seconds On the right foot submetatarsal 1 is a hyperkeratotic lesion with a skin fissure present.  Once I debrided this skin fissure measure approximately 1.2 x 0.3 x 0.1 cm.  Difficult to measure the wound prior to debridement given the callus formation.  Granulation tissue noted.  There is no surrounding erythema, ascending cellulitis.  No fluctuation or crepitation.  There is no malodor.  There is no signs of infection noted today. No pain with calf compression, swelling, warmth, erythema  Assessment: Ulceration right foot without signs of infection currently  Plan: -All treatment options discussed with the patient including all alternatives, risks, complications.  -X-rays obtained reviewed.  3 views of the foot were obtained.  There is no cortical changes to suggest osteomyelitis.  No soft tissue emphysema.  Digital deformities are noted.  Calcifications on the Achilles tendon. -Medically necessary wound debridement was performed today.  Sharply debrided the hyperkeratotic tissue to reveal the underlying ulceration today with a #312 blade scalpel to remove any nonviable, devitalized tissue in order to promote wound healing.  There is no blood loss.  Tolerated well.  Able to follow the protocol by dressing. -Recommended daily dressing changes with mupirocin . -Dispensed gel metatarsal pad for offloading. -Monitor for any clinical signs or symptoms of infection and directed to call the office immediately should any occur or go to the  ER. -Patient encouraged to call the office with any questions, concerns, change in symptoms.   Donnice JONELLE Fees DPM

## 2023-02-22 ENCOUNTER — Encounter: Payer: Self-pay | Admitting: Podiatry

## 2023-02-22 ENCOUNTER — Ambulatory Visit: Payer: Medicare Other | Admitting: Podiatry

## 2023-02-22 DIAGNOSIS — L97512 Non-pressure chronic ulcer of other part of right foot with fat layer exposed: Secondary | ICD-10-CM

## 2023-02-22 NOTE — Progress Notes (Signed)
 He presents today for follow-up of ulcer subfirst metatarsal phalangeal joint right foot he saw Dr. Alona last time he was here.  He states that seems to be doing much better there is no pain with it.  Objective: Vital signs are stable alert and oriented x 3.  Pulses are palpable.  Neurologic sensorium is diminished except to deep touch.  Reactive hyperkeratotic lesion beneath the first metatarsal phalangeal joint for the ulcer was present has normal heel though the hyperkeratosis is still present.  Contralateral foot demonstrates similar finding benign skin lesion buildup.  Assessment: Diabetes mellitus significant diabetic ulceration significant diabetic peripheral neuropathy.  Benign skin lesion.  Plan: Debrided benign skin lesions bilaterally.  Follow-up with him in a couple of weeks for his routine debridement.

## 2023-02-28 ENCOUNTER — Other Ambulatory Visit: Payer: Self-pay

## 2023-02-28 DIAGNOSIS — J301 Allergic rhinitis due to pollen: Secondary | ICD-10-CM

## 2023-02-28 DIAGNOSIS — E1169 Type 2 diabetes mellitus with other specified complication: Secondary | ICD-10-CM

## 2023-02-28 MED ORDER — ONETOUCH ULTRA 2 W/DEVICE KIT: PACK | 1 refills | Status: DC

## 2023-02-28 MED ORDER — ACYCLOVIR 400 MG PO TABS: 400.0000 mg | ORAL_TABLET | Freq: Four times a day (QID) | ORAL | 0 refills | Status: DC

## 2023-02-28 MED ORDER — ATORVASTATIN CALCIUM 20 MG PO TABS
20.0000 mg | ORAL_TABLET | Freq: Every day | ORAL | 0 refills | Status: DC
Start: 2023-02-28 — End: 2023-04-17

## 2023-02-28 MED ORDER — FLUTICASONE PROPIONATE 50 MCG/ACT NA SUSP: NASAL | 2 refills | Status: DC

## 2023-03-08 ENCOUNTER — Other Ambulatory Visit: Payer: Self-pay

## 2023-03-08 MED ORDER — ONETOUCH ULTRA VI STRP: ORAL_STRIP | 3 refills | Status: AC

## 2023-03-13 ENCOUNTER — Ambulatory Visit (INDEPENDENT_AMBULATORY_CARE_PROVIDER_SITE_OTHER): Payer: Medicare Other | Admitting: Podiatry

## 2023-03-13 ENCOUNTER — Encounter: Payer: Self-pay | Admitting: Podiatry

## 2023-03-13 DIAGNOSIS — D2371 Other benign neoplasm of skin of right lower limb, including hip: Secondary | ICD-10-CM

## 2023-03-13 DIAGNOSIS — G894 Chronic pain syndrome: Secondary | ICD-10-CM | POA: Diagnosis not present

## 2023-03-13 DIAGNOSIS — D2372 Other benign neoplasm of skin of left lower limb, including hip: Secondary | ICD-10-CM | POA: Diagnosis not present

## 2023-03-13 DIAGNOSIS — E1142 Type 2 diabetes mellitus with diabetic polyneuropathy: Secondary | ICD-10-CM

## 2023-03-13 DIAGNOSIS — M4326 Fusion of spine, lumbar region: Secondary | ICD-10-CM | POA: Diagnosis not present

## 2023-03-13 DIAGNOSIS — B351 Tinea unguium: Secondary | ICD-10-CM | POA: Diagnosis not present

## 2023-03-13 DIAGNOSIS — M791 Myalgia, unspecified site: Secondary | ICD-10-CM | POA: Diagnosis not present

## 2023-03-13 DIAGNOSIS — M79676 Pain in unspecified toe(s): Secondary | ICD-10-CM

## 2023-03-13 NOTE — Progress Notes (Signed)
 He presents today for a chief concern of his toenails and whether or not his ulcerative lesion has healed.  Objective: Vital signs are stable he is alert and oriented x 3 pulses are palpable.  No open lesions at this point plantar aspect of foot reactive hyper keratomas are present.  I suppose these are preulcerative lesions to some degree but do not demonstrate any breakdown at this point.  Otherwise toenails are long thick yellow dystrophic with mycotic.  Assessment: Benign skin lesions bilateral pain limb secondary to onychomycosis.  Plan: Debridement of toenails 1 through 5 bilateral.

## 2023-03-20 ENCOUNTER — Ambulatory Visit: Payer: Medicare Other | Admitting: Nurse Practitioner

## 2023-04-05 ENCOUNTER — Other Ambulatory Visit: Payer: Self-pay

## 2023-04-05 MED ORDER — ACYCLOVIR 400 MG PO TABS: 400.0000 mg | ORAL_TABLET | Freq: Every day | ORAL | 0 refills | Status: DC

## 2023-04-13 ENCOUNTER — Ambulatory Visit: Payer: Medicare Other | Admitting: Nurse Practitioner

## 2023-04-17 ENCOUNTER — Encounter (HOSPITAL_BASED_OUTPATIENT_CLINIC_OR_DEPARTMENT_OTHER): Payer: Self-pay | Admitting: Family Medicine

## 2023-04-17 ENCOUNTER — Ambulatory Visit (HOSPITAL_BASED_OUTPATIENT_CLINIC_OR_DEPARTMENT_OTHER): Payer: Medicare Other | Admitting: Family Medicine

## 2023-04-17 VITALS — BP 134/86 | HR 89 | Ht 75.0 in | Wt 273.2 lb

## 2023-04-17 DIAGNOSIS — Z7984 Long term (current) use of oral hypoglycemic drugs: Secondary | ICD-10-CM

## 2023-04-17 DIAGNOSIS — E1142 Type 2 diabetes mellitus with diabetic polyneuropathy: Secondary | ICD-10-CM | POA: Diagnosis not present

## 2023-04-17 DIAGNOSIS — E785 Hyperlipidemia, unspecified: Secondary | ICD-10-CM

## 2023-04-17 DIAGNOSIS — G2581 Restless legs syndrome: Secondary | ICD-10-CM | POA: Diagnosis not present

## 2023-04-17 DIAGNOSIS — I10 Essential (primary) hypertension: Secondary | ICD-10-CM | POA: Diagnosis not present

## 2023-04-17 DIAGNOSIS — E559 Vitamin D deficiency, unspecified: Secondary | ICD-10-CM

## 2023-04-17 DIAGNOSIS — E1169 Type 2 diabetes mellitus with other specified complication: Secondary | ICD-10-CM

## 2023-04-17 DIAGNOSIS — D509 Iron deficiency anemia, unspecified: Secondary | ICD-10-CM

## 2023-04-17 DIAGNOSIS — Z7985 Long-term (current) use of injectable non-insulin antidiabetic drugs: Secondary | ICD-10-CM

## 2023-04-17 LAB — POCT GLYCOSYLATED HEMOGLOBIN (HGB A1C)
HbA1c POC (<> result, manual entry): 6.1 % (ref 4.0–5.6)
HbA1c, POC (controlled diabetic range): 6.1 % (ref 0.0–7.0)
HbA1c, POC (prediabetic range): 6.1 % (ref 5.7–6.4)
Hemoglobin A1C: 6.1 % — AB (ref 4.0–5.6)

## 2023-04-17 MED ORDER — ATORVASTATIN CALCIUM 20 MG PO TABS: 20.0000 mg | ORAL_TABLET | Freq: Every day | ORAL | 3 refills | Status: DC

## 2023-04-17 NOTE — Assessment & Plan Note (Signed)
 Tolerating atorvastatin well, can continue with medication, no issues at this time

## 2023-04-17 NOTE — Assessment & Plan Note (Signed)
 Hemoglobin A1c has been well-controlled with current regimen.  No current issues with medications at the present moment.  Due for recheck of hemoglobin A1c, found to be at goal at 6.1% today.  We can continue with intermittent monitoring.

## 2023-04-17 NOTE — Assessment & Plan Note (Signed)
 Can continue with current medication regimen, no changes to be made today

## 2023-04-17 NOTE — Assessment & Plan Note (Signed)
 Blood pressure borderline in office today.  Can continue with current medication regimen.  Recommend intermittent monitoring of blood pressure at home, DASH diet

## 2023-04-17 NOTE — Patient Instructions (Signed)
  Medication Instructions:  Your physician recommends that you continue on your current medications as directed. Please refer to the Current Medication list given to you today. --If you need a refill on any your medications before your next appointment, please call your pharmacy first. If no refills are authorized on file call the office.-- Lab Work: Your physician has recommended that you have lab work today: 1 week before next visit  If you have labs (blood work) drawn today and your tests are completely normal, you will receive your results via MyChart message OR a phone call from our staff.  Please ensure you check your voicemail in the event that you authorized detailed messages to be left on a delegated number. If you have any lab test that is abnormal or we need to change your treatment, we will call you to review the results.   Follow-Up: Your next appointment:   Your physician recommends that you schedule a follow-up appointment in: 3-4 month follow up  with Dr. de Peru  You will receive a text message or e-mail with a link to a survey about your care and experience with [place] today! We would greatly appreciate your feedback!   Thanks for letting us be apart of your health journey!!  Primary Care and Sports Medicine   Dr. Ceasar Mons Peru   We encourage you to activate your patient portal called "MyChart".  Sign up information is provided on this After Visit Summary.  MyChart is used to connect with patients for Virtual Visits (Telemedicine).  Patients are able to view lab/test results, encounter notes, upcoming appointments, etc.  Non-urgent messages can be sent to your provider as well. To learn more about what you can do with MyChart, please visit --  [email or web address].

## 2023-04-17 NOTE — Progress Notes (Signed)
 New Patient Office Visit  Subjective   Patient ID: Jason Wyatt, male    DOB: [birth year]  Age: 68 y.o. MRN: [id number]  CC:  Chief Complaint  Patient presents with   New Patient (Initial Visit)    New Patient Dr Vaughan Basta pt     HPI Jason Wyatt presents to establish care Last PCP - Dr. Oneta Rack Accompanied to the office today by his son Jason Wyatt  DM: has associated neuropathy, follows with Dr. Al Corpus of podiatry. Takes Trulicity 4.5 mg weekly and metformin 500 mg bid.  Prior hemoglobin A1c has been well-controlled.  He does check his blood sugar regularly.  Hyperlipidemia: Currently taking atorvastatin, denies any issues with medication.  No reported side effects.  Restless leg syndrome: Currently takes Sinemet, gabapentin, duloxetine.  These are also related to underlying neuropathy.  Patient also with history of CKD.  Has been stable on prior labs. History of vitamin D deficiency, does take high-dose vitamin D weekly.  Recent vitamin D levels have actually been slightly elevated.  Patient with history of gout, takes allopurinol for this.  History of iron deficiency anemia.  Has been taking iron supplement, was advised by hematologist to only take this iron supplement once daily.  Hypertension: Currently takes olmesartan and propranolol.  Denies any issues with medication.  Patient is originally from [place], [place]. He previously worked as a Education officer, environmental. He enjoys spending time with his son, volunteers at church.   Outpatient Encounter Medications as of 04/17/2023  Medication Sig   acyclovir (ZOVIRAX) 400 MG tablet Take 1 tablet (400 mg total) by mouth daily.   allopurinol (ZYLOPRIM) 300 MG tablet TAKE 1 TABLET BY MOUTH DAILY FOR GOUT PREVENTION   aspirin 81 MG tablet Take 81 mg by mouth daily.   Blood Glucose Monitoring Suppl (ONE TOUCH ULTRA 2) w/Device KIT USE AS DIRECTED   carbidopa-levodopa (SINEMET IR) 25-250 MG tablet TAKE 1 TABLET BY MOUTH TWICE  DAILY FOR RESTLESS LEGS    Cyanocobalamin (B-12) 2500 MCG SUBL Place under the tongue every other day. 4 times a week   dextromethorphan-guaiFENesin (MUCINEX DM) 30-600 MG 12hr tablet Take 1 tablet by mouth 2 (two) times daily as needed for cough.   DULoxetine (CYMBALTA) 60 MG capsule TAKE 1 CAPSULE BY MOUTH AT  BEDTIME   ferrous sulfate 325 (65 FE) MG tablet Take 325 mg by mouth daily with breakfast.   fexofenadine (ALLEGRA) 180 MG tablet Take 180 mg by mouth daily as needed.   fluticasone (FLONASE) 50 MCG/ACT nasal spray USE 2 SPRAYS IN BOTH NOSTRILS  ONCE DAILY   gabapentin (NEURONTIN) 600 MG tablet Take 1 tablet (600 mg total) by mouth 2 (two) times daily.   glucose blood (ONETOUCH ULTRA) test strip USE TO CHECK BLOOD SUGAR  DAILY   Lancets (ONETOUCH DELICA PLUS LANCET33G) MISC USE WITH METER TO CHECK BLOOD  SUGAR ONCE DAILY   Magnesium 400 MG CAPS Take 800 mg by mouth at bedtime.   meloxicam (MOBIC) 7.5 MG tablet Take 1 tablet Daily  with Food  is needed for Pain   metFORMIN (GLUCOPHAGE-XR) 500 MG 24 hr tablet TAKE 2 TABLETS BY MOUTH TWICE  DAILY WITH MEALS FOR DIABETES   methocarbamol (ROBAXIN) 500 MG tablet Take 500 mg by mouth 3 (three) times daily as needed. PRN   Multiple Vitamin (MULTIVITAMIN) tablet Take 1 tablet by mouth daily.   mupirocin ointment (BACTROBAN) 2 % Apply 1 Application topically 2 (two) times daily.   Naphazoline-Pheniramine (EYE ALLERGY RELIEF OP)  Apply to eye.   olmesartan (BENICAR) 40 MG tablet TAKE 1 TABLET BY MOUTH AT NIGHT  FOR BLOOD PRESSURE AND DIABETIC  KIDNEY PROTECTION   Omega 3 1200 MG CAPS Take 1 capsule by mouth daily.   pantoprazole (PROTONIX) 40 MG tablet TAKE 1 TABLET DAILY TO  PREVENT HEARTBURN &amp;  INDIGESTION   propranolol (INDERAL) 40 MG tablet Take 1 tablet (40 mg total) by mouth daily.   tamsulosin (FLOMAX) 0.4 MG CAPS capsule TAKE 2 CAPSULES BY MOUTH AT  BEDTIME FOR PROSTATE   TRULICITY 4.5 MG/0.5ML SOPN Inject 1 pen  (4.5 mg) into Skin every 7 days   Vitamin D,  Ergocalciferol, (DRISDOL) 1.25 MG (50000 UNIT) CAPS capsule TAKE 1 CAPSULE BY MOUTH 2 DAYS  WEEKLY FOR VITAMIN D DEFICIENCY   [DISCONTINUED] atorvastatin (LIPITOR) 20 MG tablet Take 1 tablet (20 mg total) by mouth daily.   atorvastatin (LIPITOR) 20 MG tablet Take 1 tablet (20 mg total) by mouth daily.   No facility-administered encounter medications on file as of 04/17/2023.    Past Medical History:  Diagnosis Date   Acute meniscal tear of left knee    Allergic rhinitis    Arthritis     back   Borderline glaucoma    Chronic back pain    stenosis   CKD (chronic kidney disease), stage II    Complication of anesthesia cervical fusion surgery 06/2010   "woke up next day w/ventilator on" told due to OSA   GERD (gastroesophageal reflux disease)    takes Prevacid daily   Gout    takes Allopurinol daily and Colchicine if needed   History of adenomatous polyp of colon    tubular adenoma's   History of Barrett's esophagus    History of hiatal hernia    Hyperlipidemia    Hypertension    Incomplete right bundle branch block (RBBB)    OA (osteoarthritis)    knee   OSA on CPAP    Peripheral neuropathy    Prostate cancer [facility]) urologist-  dr Laverle Patter   dx 2013  s/p  radical non-nerve sparing prostatectomy 2014--  Stage pT2cNx,  PSA 3.48,  Gleason 3+3,  vol 55cc/  current PSA  0.01 (Aug 2016)   Rupture of Achilles tendon    Thyroid nodule    left side   Type II diabetes mellitus (HCC)    Urinary frequency    takes Flomax daily   Urine incontinence    Wears glasses     Past Surgical History:  Procedure Laterality Date   ACHILLES TENDON SURGERY Left 06/27/2017   Procedure: ACHILLES TENDON REPAIR;  Surgeon: Park Liter, DPM;  Location: [facility];  Service: Podiatry;  Laterality: Left;   ANAL FISSURE REPAIR  06-05-2002   BACK SURGERY     CARDIAC CATHETERIZATION     CARDIOVASCULAR STRESS TEST  01-01-2008   normal nuclear study/  no ischemia/  normal LV function and wall motion , 57%    CARPAL TUNNEL RELEASE Left 03/27/2018   CARPAL TUNNEL RELEASE Right 06/2018   ESOPHAGOGASTRODUODENOSCOPY  09/26/2011   Procedure: ESOPHAGOGASTRODUODENOSCOPY (EGD);  Surgeon: Florencia Reasons, MD;  Location: [facility];  Service: Endoscopy;  Laterality: N/A;   GASTROC RECESSION EXTREMITY Left 06/27/2017   Procedure: GASTROC RECESSION EXTREMITY;  Surgeon: Park Liter, DPM;  Location: [facility];  Service: Podiatry;  Laterality: Left;   KNEE ARTHROSCOPY WITH MEDIAL MENISECTOMY Left 12/31/2014   Procedure: LEFT KNEE ARTHROSCOPY, PARTIAL MEDIAL AND LATERAL  MENISECTOMIES WITH DEDRIDEMENT;  Surgeon: Jene Every, MD;  Location[facility];  Service: Orthopedics;  Laterality: Left;   LEFT AND RIGHT HEART CATHETERIZATION WITH CORONARY ANGIOGRAM N/A 08/25/2011   Procedure: LEFT AND RIGHT HEART CATHETERIZATION WITH CORONARY ANGIOGRAM;  Surgeon: Peter M Swaziland, MD;  Location: [facility] C[facility];  Service: Cardiovascular;  Laterality: N/A;   Normal coronary arteries and LVF, ef 55-60%   POSTERIOR FUSION CERVICAL SPINE  07-05-2010   C1 --  C4   POSTERIOR LUMBAR FUSION  05-18-2014   L4 -- S1   ROBOT ASSISTED LAPAROSCOPIC RADICAL PROSTATECTOMY  02/07/2012   Procedure: ROBOTIC ASSISTED LAPAROSCOPIC RADICAL PROSTATECTOMY LEVEL 1;  Surgeon: Milford Cage, MD;  Location: [facility];  Service: Urology;  Laterality: N/A;      TENDON TRANSFER Left 06/27/2017   Procedure: TENDON TRANSFER;  Surgeon: Park Liter, DPM;  Location: [facility];  Service: Podiatry;  Laterality: Left;   TOTAL KNEE ARTHROPLASTY Left 08/05/2015   Procedure: LEFT TOTAL KNEE ARTHROPLASTY;  Surgeon: Jene Every, MD;  Location: [facility];  Service: Orthopedics;  Laterality: Left;   TRANSTHORACIC ECHOCARDIOGRAM  08-03-2011   mild LVH, ef 55-60%/  trivial TR and PR   UVULOPALATOPHARYNGOPLASTY (UPPP)/TONSILLECTOMY/SEPTOPLASTY  1990's   and Adenoidectomy   wisdom teeth extracted   1982    Family History  Problem Relation Age of Onset    Hypertension Mother    Dementia Mother    Osteoporosis Mother    Osteoarthritis Mother    Cancer Father    Hyperlipidemia Sister    Hypertension Sister    Stroke Maternal Grandmother    Heart attack Maternal Grandmother    Hypertension Paternal Grandfather    Sleep apnea Son    Sleep apnea Son     Social History   Socioeconomic History   Marital status: Widowed    Spouse name: Not on file   Number of children: 2   Years of education: Not on file   Highest education level: Associate degree: occupational, Scientist, product/process development, or vocational program  Occupational History   Not on file  Tobacco Use   Smoking status: Never    Passive exposure: Never   Smokeless tobacco: Never  Vaping Use   Vaping status: Never Used  Substance and Sexual Activity   Alcohol use: Never   Drug use: Never   Sexual activity: Not on file  Other Topics Concern   Not on file  Social History Narrative   Lives at home with wife   Caffeine: never   Social Drivers of Corporate investment banker Strain: Medium Risk (04/14/2023)   Overall Financial Resource Strain (CARDIA)    Difficulty of Paying Living Expenses: Somewhat hard  Food Insecurity: No Food Insecurity (04/14/2023)   Hunger Vital Sign    Worried About Running Out of Food in the Last Year: Never true    Ran Out of Food in the Last Year: Never true  Transportation Needs: No Transportation Needs (04/14/2023)   PRAPARE - Administrator, Civil Service (Medical): No    Lack of Transportation (Non-Medical): No  Physical Activity: Insufficiently Active (04/14/2023)   Exercise Vital Sign    Days of Exercise per Week: 3 days    Minutes of Exercise per Session: 30 min  Stress: No Stress Concern Present (04/14/2023)   [employer] of Occupational Health - Occupational Stress Questionnaire    Feeling of Stress : Only a little  Social Connections: Moderately Integrated (04/14/2023)   Social Connection and Isolation Panel [NHANES]  Frequency  of Communication with Friends and Family: More than three times a week    Frequency of Social Gatherings with Friends and Family: More than three times a week    Attends Religious Services: 1 to 4 times per year    Active Member of [employer] or Organizations: Yes    Attends Banker Meetings: 1 to 4 times per year    Marital Status: Widowed  Catering manager Violence: Not on file    Objective   BP 134/86 (BP Location: Right Arm, Patient Position: Sitting, Cuff Size: Large)   Pulse 89   Ht 6\' 3"  (1.905 m)   Wt 273 lb 3.2 oz (123.9 kg)   SpO2 99%   BMI 34.15 kg/m   Physical Exam  68 year old male in no acute distress Cardiovascular exam with regular rate and rhythm Lungs clear to auscultation bilaterally  Assessment & Plan:   DM type 2 with diabetic peripheral neuropathy (HCC) Assessment & Plan: Hemoglobin A1c has been well-controlled with current regimen.  No current issues with medications at the present moment.  Due for recheck of hemoglobin A1c, found to be at goal at 6.1% today.  We can continue with intermittent monitoring.  Orders: -     Ambulatory referral to Ophthalmology -     Ambulatory referral to Podiatry -     POCT glycosylated hemoglobin (Hb A1C) -     Comprehensive metabolic panel with GFR; Future -     Hemoglobin A1c; Future -     Lipid panel; Future  Hyperlipidemia associated with type 2 diabetes mellitus (HCC) Assessment & Plan: Tolerating atorvastatin well, can continue with medication, no issues at this time  Orders: -     Atorvastatin Calcium; Take 1 tablet (20 mg total) by mouth daily.  Dispense: 90 tablet; Refill: 3 -     Lipid panel; Future  Primary hypertension Assessment & Plan: Blood pressure borderline in office today.  Can continue with current medication regimen.  Recommend intermittent monitoring of blood pressure at home, DASH diet  Orders: -     CBC with Differential/Platelet; Future -     Comprehensive metabolic panel with  GFR; Future -     Iron, TIBC and Ferritin Panel; Future -     Hemoglobin A1c; Future -     Lipid panel; Future -     TSH Rfx on Abnormal to Free T4; Future -     VITAMIN D 25 Hydroxy (Vit-D Deficiency, Fractures); Future  RLS Assessment & Plan: Can continue with current medication regimen, no changes to be made today  Orders: -     Ambulatory referral to Podiatry  Iron deficiency anemia, unspecified iron deficiency anemia type -     CBC with Differential/Platelet; Future -     Iron, TIBC and Ferritin Panel; Future  Vitamin D deficiency -     VITAMIN D 25 Hydroxy (Vit-D Deficiency, Fractures); Future  Return in about 3 months (around 07/17/2023) for diabetes, hyperlipidemia, labs 1 week prior.    ___________________________________________ Kathleen Tamm de [place], MD, ABFM, CAQSM Primary Care and Sports Medicine [facility]

## 2023-04-18 ENCOUNTER — Ambulatory Visit (HOSPITAL_BASED_OUTPATIENT_CLINIC_OR_DEPARTMENT_OTHER)

## 2023-04-18 ENCOUNTER — Encounter (HOSPITAL_BASED_OUTPATIENT_CLINIC_OR_DEPARTMENT_OTHER): Payer: Self-pay | Admitting: *Deleted

## 2023-04-18 VITALS — Ht 75.0 in | Wt 273.0 lb

## 2023-04-18 DIAGNOSIS — Z0000 Encounter for general adult medical examination without abnormal findings: Secondary | ICD-10-CM | POA: Diagnosis not present

## 2023-04-18 NOTE — Progress Notes (Signed)
 Annual Wellness Visit     Patient: Jason Wyatt, Male    DOB: [birth year], 68 y.o.   MRN: [id number]  Subjective  Chief Complaint  Patient presents with   Medicare Wellness    Called and spoke with pt to do his annual wellness visit.    Jason Wyatt is a 68 y.o. male who presents today for his Annual Wellness Visit. He reports consuming a general diet.  Goes on walks every day with a dog and also does walking at various other times to try to keep him active.  He generally feels well. He reports sleeping fairly well. He does not have additional problems to discuss today.      Medications: Outpatient Medications Prior to Visit  Medication Sig   acyclovir (ZOVIRAX) 400 MG tablet Take 1 tablet (400 mg total) by mouth daily.   allopurinol (ZYLOPRIM) 300 MG tablet TAKE 1 TABLET BY MOUTH DAILY FOR GOUT PREVENTION   aspirin 81 MG tablet Take 81 mg by mouth daily.   atorvastatin (LIPITOR) 20 MG tablet Take 1 tablet (20 mg total) by mouth daily.   Blood Glucose Monitoring Suppl (ONE TOUCH ULTRA 2) w/Device KIT USE AS DIRECTED   carbidopa-levodopa (SINEMET IR) 25-250 MG tablet TAKE 1 TABLET BY MOUTH TWICE  DAILY FOR RESTLESS LEGS   Cyanocobalamin (B-12) 2500 MCG SUBL Place under the tongue every other day. 4 times a week   dextromethorphan-guaiFENesin (MUCINEX DM) 30-600 MG 12hr tablet Take 1 tablet by mouth 2 (two) times daily as needed for cough.   DULoxetine (CYMBALTA) 60 MG capsule TAKE 1 CAPSULE BY MOUTH AT  BEDTIME   ferrous sulfate 325 (65 FE) MG tablet Take 325 mg by mouth daily with breakfast.   fexofenadine (ALLEGRA) 180 MG tablet Take 180 mg by mouth daily as needed.   fluticasone (FLONASE) 50 MCG/ACT nasal spray USE 2 SPRAYS IN BOTH NOSTRILS  ONCE DAILY   gabapentin (NEURONTIN) 600 MG tablet Take 1 tablet (600 mg total) by mouth 2 (two) times daily.   glucose blood (ONETOUCH ULTRA) test strip USE TO CHECK BLOOD SUGAR  DAILY   Lancets (ONETOUCH DELICA PLUS LANCET33G) MISC USE  WITH METER TO CHECK BLOOD  SUGAR ONCE DAILY   Magnesium 400 MG CAPS Take 800 mg by mouth at bedtime.   meloxicam (MOBIC) 7.5 MG tablet Take 1 tablet Daily  with Food  is needed for Pain   metFORMIN (GLUCOPHAGE-XR) 500 MG 24 hr tablet TAKE 2 TABLETS BY MOUTH TWICE  DAILY WITH MEALS FOR DIABETES   methocarbamol (ROBAXIN) 500 MG tablet Take 500 mg by mouth 3 (three) times daily as needed. PRN   Multiple Vitamin (MULTIVITAMIN) tablet Take 1 tablet by mouth daily.   mupirocin ointment (BACTROBAN) 2 % Apply 1 Application topically 2 (two) times daily.   Naphazoline-Pheniramine (EYE ALLERGY RELIEF OP) Apply to eye.   olmesartan (BENICAR) 40 MG tablet TAKE 1 TABLET BY MOUTH AT NIGHT  FOR BLOOD PRESSURE AND DIABETIC  KIDNEY PROTECTION   Omega 3 1200 MG CAPS Take 1 capsule by mouth daily.   pantoprazole (PROTONIX) 40 MG tablet TAKE 1 TABLET DAILY TO  PREVENT HEARTBURN &amp;  INDIGESTION   propranolol (INDERAL) 40 MG tablet Take 1 tablet (40 mg total) by mouth daily.   tamsulosin (FLOMAX) 0.4 MG CAPS capsule TAKE 2 CAPSULES BY MOUTH AT  BEDTIME FOR PROSTATE   TRULICITY 4.5 MG/0.5ML SOPN Inject 1 pen  (4.5 mg) into Skin every 7 days   Vitamin  D, Ergocalciferol, (DRISDOL) 1.25 MG (50000 UNIT) CAPS capsule TAKE 1 CAPSULE BY MOUTH 2 DAYS  WEEKLY FOR VITAMIN D DEFICIENCY   No facility-administered medications prior to visit.    Allergies  Allergen Reactions   Fenofibrate Itching and Rash   Flaxseed Oil Rash   Flaxseed [Flaxseed (Linseed)] Rash   Lyrica [Pregabalin] Itching and Rash   Niaspan [Niacin Er (Antihyperlipidemic)] Rash    Patient Care Team: de [place], Buren Kos, MD as PCP - General (Family Medicine) Runell Gess, MD as PCP - Cardiology (Cardiology) Elinor Parkinson, DPM as Consulting Physician (Podiatry) Glenford Peers, OD as Referring Physician (Optometry) Sw[place], Peter M, MD as Consulting Physician (Cardiology) Bernette Redbird, MD as Consulting Physician (Gastroenterology) Jene Every, MD as Consulting Physician (Orthopedic Surgery) Crist Fat, MD as Attending Physician (Urology) Sundra Aland, [facility] (Inactive) as Pharmacist (Pharmacist)  ROS     Objective  Ht 6\' 3"  (1.905 m)   Wt 273 lb (123.8 kg)   BMI 34.12 kg/m    Most recent functional status assessment:    04/18/2023    8:47 AM  In your present state of health, do you have any difficulty performing the following activities:  Hearing? 0  Vision? 1  Difficulty concentrating or making decisions? 1  Walking or climbing stairs? 1  Dressing or bathing? 0  Doing errands, shopping? 1  Preparing Food and eating ? N  Using the Toilet? N  In the past six months, have you accidently leaked urine? Y  Do you have problems with loss of bowel control? N  Managing your Medications? N  Managing your Finances? N  Housekeeping or managing your Housekeeping? Y   Most recent fall risk assessment:    04/18/2023    8:52 AM  Fall Risk   Falls in the past year? 0  Number falls in past yr: 0  Injury with Fall? 0  Risk for fall due to : No Fall Risks  Follow up Falls evaluation completed    Most recent depression screenings:    04/18/2023    8:53 AM 04/17/2023    9:08 AM  PHQ 2/9 Scores  PHQ - 2 Score 0 0  PHQ- 9 Score 0 0   Most recent cognitive screening:    04/18/2023    8:53 AM  6CIT Screen  What Year? 0 points  What month? 0 points  What time? 0 points  Count back from 20 0 points  Months in reverse 0 points  Repeat phrase 0 points  Total Score 0 points   Most recent Audit-C alcohol use screening    04/18/2023    8:50 AM  Alcohol Use Disorder Test (AUDIT)  1. How often do you have a drink containing alcohol? 0  2. How many drinks containing alcohol do you have on a typical day when you are drinking? 0  3. How often do you have six or more drinks on one occasion? 0  AUDIT-C Score 0   A score of 3 or more in women, and 4 or more in men indicates increased risk for alcohol abuse,  EXCEPT if all of the points are from question 1   Assessment & Plan   Annual wellness visit done today including the all of the following: Reviewed patient's Family Medical History Reviewed and updated list of patient's medical providers Assessment of cognitive impairment was done Assessed patient's functional ability Established a written schedule for health screening services Health Risk Assessent Completed and Reviewed  Exercise Activities and Dietary recommendations  Goals       Exercise 150 min/wk Moderate Activity      Weight (lb) < 250 lb (113.4 kg) (pt-stated)        Immunization History  Administered Date(s) Administered   Hep A / Hep B 12/16/2002, 01/21/2003, 06/19/2003   Influenza Inj Mdck Quad With Preservative 11/22/2016, 12/04/2017   Influenza Split 10/17/2013, 10/29/2014   Influenza, High Dose Seasonal PF 11/26/2018, 11/27/2019, 11/29/2020, 12/06/2021, 01/11/2023   Influenza, Seasonal, Injecte, Preservative Fre 11/29/2015   Influenza-Unspecified 11/04/2012   PFIZER(Purple Top)SARS-COV-2 Vaccination 03/10/2019, 03/31/2019, 11/06/2019   PPD Test 01/16/2006, 04/07/2013   Pfizer Covid-19 Vaccine Bivalent Booster 46yrs & up 02/16/2021   Pfizer(Comirnaty)Fall Seasonal Vaccine 12 years and older 11/01/2022   Pneumococcal Conjugate-13 07/29/2018   Pneumococcal Polysaccharide-23 04/07/2013, 11/22/2016   Pneumococcal-Unspecified 09/17/2010   Td 09/24/2001, 01/17/2011, 03/10/2021   Td (Adult), 2 Lf Tetanus Toxid, Preservative Free 01/17/2011   Typhoid Inactivated 02/09/2005   Typhoid Live 12/16/2002   Zoster Recombinant(Shingrix) 02/16/2021   Zoster, Live 01/16/2005    Health Maintenance  Topic Date Due   Zoster Vaccines- Shingrix (2 of 2) 04/13/2021   Pneumonia Vaccine 39+ Years old (3 of 3 - PPSV23 or PCV20) 11/22/2021   FOOT EXAM  12/17/2022   OPHTHALMOLOGY EXAM  12/30/2022   COVID-19 Vaccine (6 - Pfizer risk 2024-25 season) 05/02/2023   INFLUENZA VACCINE   08/17/2023   HEMOGLOBIN A1C  10/17/2023   Diabetic kidney evaluation - eGFR measurement  01/11/2024   Diabetic kidney evaluation - Urine ACR  01/11/2024   Medicare Annual Wellness (AWV)  04/17/2024   Colonoscopy  11/24/2026   DTaP/Tdap/Td (5 - Tdap) 03/11/2031   Hepatitis C Screening  Completed   HPV VACCINES  Aged Out     Discussed health benefits of physical activity, and encouraged him to engage in regular exercise appropriate for his age and condition.    Problem List Items Addressed This Visit   None Visit Diagnoses       Encounter for Medicare annual wellness exam    -  Primary       Return in 1 year (on 04/17/2024).     Altair Stanko, Farley Ly, CMA

## 2023-04-18 NOTE — Patient Instructions (Signed)
 Mr. Oelkers , Thank you for taking time to come for your Medicare Wellness Visit. I appreciate your ongoing commitment to your health goals. Please review the following plan we discussed and let me know if I can assist you in the future.   Referrals/Orders/Follow-Ups/Clinician Recommendations: No orders placed during today's visit. Keep up the good work with working on getting your weight down. Make sure you continue to have your foot exams performed and keep your eye exams up-to-date as well. Whenever it is time, please make sure you get your next pneumonia vaccine. Please check to make sure you are up-to-date on your shingles vaccines and that you do not need anymore.  This is a list of the screening recommended for you and due dates:  Health Maintenance  Topic Date Due   Zoster (Shingles) Vaccine (2 of 2) 04/13/2021   Pneumonia Vaccine (3 of 3 - PPSV23 or PCV20) 11/22/2021   Complete foot exam   12/17/2022   Eye exam for diabetics  12/30/2022   COVID-19 Vaccine (6 - Pfizer risk 2024-25 season) 05/02/2023   Flu Shot  08/17/2023   Hemoglobin A1C  10/17/2023   Yearly kidney function blood test for diabetes  01/11/2024   Yearly kidney health urinalysis for diabetes  01/11/2024   Medicare Annual Wellness Visit  04/17/2024   Colon Cancer Screening  11/24/2026   DTaP/Tdap/Td vaccine (5 - Tdap) 03/11/2031   Hepatitis C Screening  Completed   HPV Vaccine  Aged Out    Advanced directives: (ACP Link)Information on Advanced Care Planning can be found at [facility] Advance Health Care Directives Advance Health Care Directives. [email or web address]   Next Medicare Annual Wellness Visit scheduled for next year: No

## 2023-04-23 NOTE — Progress Notes (Signed)
 Patient Location: Home Provider Location: In office

## 2023-04-25 ENCOUNTER — Other Ambulatory Visit: Payer: Self-pay | Admitting: Family

## 2023-04-30 ENCOUNTER — Other Ambulatory Visit (HOSPITAL_BASED_OUTPATIENT_CLINIC_OR_DEPARTMENT_OTHER): Payer: Self-pay | Admitting: Family Medicine

## 2023-04-30 DIAGNOSIS — E1142 Type 2 diabetes mellitus with diabetic polyneuropathy: Secondary | ICD-10-CM

## 2023-04-30 DIAGNOSIS — Z79899 Other long term (current) drug therapy: Secondary | ICD-10-CM

## 2023-04-30 DIAGNOSIS — E559 Vitamin D deficiency, unspecified: Secondary | ICD-10-CM

## 2023-04-30 NOTE — Telephone Encounter (Unsigned)
 Copied from CRM [id number]. Topic: Clinical - Medication Refill >> Apr 30, 2023  9:37 AM Turkey B wrote: Most Recent Primary Care Visit:  Provider: Eve Hinders  Department: [facility]  Visit Type: MEDICARE AWV, SEQUENTIAL  Date: 04/18/2023  Medication: Vitamin D, Ergocalciferol, (DRISDOL) 1.25 MG (50000 UNIT) CAPS capsule/gabapentin (NEURONTIN) 600 MG table  Has the patient contacted their pharmacy? yes (Agent: If no, request that the patient contact the pharmacy for the refill. If patient does not wish to contact the pharmacy document the reason why and proceed with request.) (Agent: If yes, when and what did the pharmacy advise?)contact pcp  Is this the correct pharmacy for this prescription? yes  This is the patient's preferred pharmacy:    [facility] [place] - [address] [place] [address] Phone: [phone] Fax: [phone]   Has the prescription been filled recently? no  Is the patient out of the medication? yes  Has the patient been seen for an appointment in the last year OR does the patient have an upcoming appointment? yes  Can we respond through MyChart? yes  Agent: Please be advised that Rx refills may take up to 3 business days. We ask that you follow-up with your pharmacy.

## 2023-05-01 ENCOUNTER — Encounter (HOSPITAL_BASED_OUTPATIENT_CLINIC_OR_DEPARTMENT_OTHER): Payer: Self-pay | Admitting: *Deleted

## 2023-05-01 MED ORDER — GABAPENTIN 600 MG PO TABS: 600.0000 mg | ORAL_TABLET | Freq: Two times a day (BID) | ORAL | 3 refills | Status: DC

## 2023-05-02 MED ORDER — VITAMIN D (ERGOCALCIFEROL) 1.25 MG (50000 UNIT) PO CAPS: 50000.0000 [IU] | ORAL_CAPSULE | ORAL | 0 refills | Status: DC

## 2023-05-10 ENCOUNTER — Other Ambulatory Visit (HOSPITAL_BASED_OUTPATIENT_CLINIC_OR_DEPARTMENT_OTHER): Payer: Self-pay | Admitting: *Deleted

## 2023-05-10 MED ORDER — PANTOPRAZOLE SODIUM 40 MG PO TBEC: DELAYED_RELEASE_TABLET | ORAL | 3 refills | Status: DC

## 2023-05-10 MED ORDER — ACYCLOVIR 400 MG PO TABS: 400.0000 mg | ORAL_TABLET | Freq: Every day | ORAL | 0 refills | Status: DC

## 2023-06-03 ENCOUNTER — Other Ambulatory Visit (HOSPITAL_BASED_OUTPATIENT_CLINIC_OR_DEPARTMENT_OTHER): Payer: Self-pay | Admitting: Family Medicine

## 2023-06-03 DIAGNOSIS — E559 Vitamin D deficiency, unspecified: Secondary | ICD-10-CM

## 2023-06-03 DIAGNOSIS — Z79899 Other long term (current) drug therapy: Secondary | ICD-10-CM

## 2023-06-12 ENCOUNTER — Telehealth: Payer: Self-pay | Admitting: Podiatry

## 2023-06-12 ENCOUNTER — Ambulatory Visit: Payer: Medicare Other | Admitting: Podiatry

## 2023-06-12 DIAGNOSIS — M79676 Pain in unspecified toe(s): Secondary | ICD-10-CM

## 2023-06-12 DIAGNOSIS — D2371 Other benign neoplasm of skin of right lower limb, including hip: Secondary | ICD-10-CM

## 2023-06-12 DIAGNOSIS — E1142 Type 2 diabetes mellitus with diabetic polyneuropathy: Secondary | ICD-10-CM | POA: Diagnosis not present

## 2023-06-12 DIAGNOSIS — D2372 Other benign neoplasm of skin of left lower limb, including hip: Secondary | ICD-10-CM

## 2023-06-12 DIAGNOSIS — B351 Tinea unguium: Secondary | ICD-10-CM | POA: Diagnosis not present

## 2023-06-12 NOTE — Progress Notes (Signed)
 Presents today chief complaint of painful elongated toenails and calluses bilaterally.  Objective: Vital signs stable alert oriented x 3.  Pulses are palpable.  Neuropathy is unchanged no open lesions or wounds.  Multiple thick calluses with fissures.  Toenails are long thick yellow dystrophic onychomycotic there are no open lesions or wounds.  Assessment: Pain in limb secondary to onychomycosis diabetic peripheral neuropathy and benign skin lesions with seizures.  Plan: Debridement of all benign skin lesions fissures and nails.  Follow-up with him in 3 months.

## 2023-06-12 NOTE — Telephone Encounter (Signed)
 Pt would like referral to [facility]

## 2023-06-26 ENCOUNTER — Telehealth (HOSPITAL_BASED_OUTPATIENT_CLINIC_OR_DEPARTMENT_OTHER): Payer: Self-pay | Admitting: *Deleted

## 2023-06-26 NOTE — Telephone Encounter (Signed)
Please advise if this has been completed.

## 2023-06-26 NOTE — Telephone Encounter (Signed)
 Copied from CRM [id number]. Topic: Clinical - Prescription Issue >> Jun 26, 2023  4:17 PM Crispin Dolphin wrote: Reason for CRM: Patient called. States he would like to speak to Nurse. He brought papers for [employer] that needed to be completed 5/20. Its 6/10 and they still have not received them and he is almost out of medication. Needs provider to complete Rx template and sent both pages back to program. Thank You

## 2023-07-09 ENCOUNTER — Other Ambulatory Visit (HOSPITAL_COMMUNITY): Payer: Self-pay | Admitting: Family Medicine

## 2023-07-09 ENCOUNTER — Other Ambulatory Visit (HOSPITAL_BASED_OUTPATIENT_CLINIC_OR_DEPARTMENT_OTHER): Payer: Self-pay | Admitting: *Deleted

## 2023-07-09 DIAGNOSIS — D509 Iron deficiency anemia, unspecified: Secondary | ICD-10-CM

## 2023-07-09 DIAGNOSIS — E559 Vitamin D deficiency, unspecified: Secondary | ICD-10-CM

## 2023-07-09 DIAGNOSIS — E1142 Type 2 diabetes mellitus with diabetic polyneuropathy: Secondary | ICD-10-CM

## 2023-07-09 DIAGNOSIS — I10 Essential (primary) hypertension: Secondary | ICD-10-CM

## 2023-07-09 DIAGNOSIS — E1169 Type 2 diabetes mellitus with other specified complication: Secondary | ICD-10-CM

## 2023-07-10 LAB — COMPREHENSIVE METABOLIC PANEL WITH GFR
ALT: 13 IU/L (ref 0–44)
AST: 14 IU/L (ref 0–40)
Albumin: 4.3 g/dL (ref 3.9–4.9)
Alkaline Phosphatase: 84 IU/L (ref 44–121)
BUN/Creatinine Ratio: 12 (ref 10–24)
BUN: 11 mg/dL (ref 8–27)
Bilirubin Total: 0.5 mg/dL (ref 0.0–1.2)
CO2: 23 mmol/L (ref 20–29)
Calcium: 10 mg/dL (ref 8.6–10.2)
Chloride: 101 mmol/L (ref 96–106)
Creatinine, Ser: 0.92 mg/dL (ref 0.76–1.27)
Globulin, Total: 2.1 g/dL (ref 1.5–4.5)
Glucose: 114 mg/dL — ABNORMAL HIGH (ref 70–99)
Potassium: 4.7 mmol/L (ref 3.5–5.2)
Sodium: 141 mmol/L (ref 134–144)
Total Protein: 6.4 g/dL (ref 6.0–8.5)
eGFR: 89 mL/min/{1.73_m2} (ref >59)

## 2023-07-10 LAB — TSH RFX ON ABNORMAL TO FREE T4: TSH: 2.38 u[IU]/mL (ref 0.450–4.500)

## 2023-07-10 LAB — LIPID PANEL
Chol/HDL Ratio: 2.8 ratio (ref 0.0–5.0)
Cholesterol, Total: 98 mg/dL — ABNORMAL LOW (ref 100–199)
HDL: 35 mg/dL — ABNORMAL LOW (ref >39)
LDL Chol Calc (NIH): 36 mg/dL (ref 0–99)
Triglycerides: 160 mg/dL — ABNORMAL HIGH (ref 0–149)
VLDL Cholesterol Cal: 27 mg/dL (ref 5–40)

## 2023-07-10 LAB — CBC WITH DIFFERENTIAL/PLATELET
Basophils Absolute: 0.1 10*3/uL (ref 0.0–0.2)
Basos: 1 %
EOS (ABSOLUTE): 0.1 10*3/uL (ref 0.0–0.4)
Eos: 2 %
Hematocrit: 40.8 % (ref 37.5–51.0)
Hemoglobin: 13.8 g/dL (ref 13.0–17.7)
Immature Grans (Abs): 0.1 10*3/uL (ref 0.0–0.1)
Immature Granulocytes: 1 %
Lymphocytes Absolute: 1.4 10*3/uL (ref 0.7–3.1)
Lymphs: 21 %
MCH: 33.3 pg — ABNORMAL HIGH (ref 26.6–33.0)
MCHC: 33.8 g/dL (ref 31.5–35.7)
MCV: 98 fL — ABNORMAL HIGH (ref 79–97)
Monocytes Absolute: 0.4 10*3/uL (ref 0.1–0.9)
Monocytes: 7 %
Neutrophils Absolute: 4.4 10*3/uL (ref 1.4–7.0)
Neutrophils: 68 %
Platelets: 225 10*3/uL (ref 150–450)
RBC: 4.15 x10E6/uL (ref 4.14–5.80)
RDW: 13 % (ref 11.6–15.4)
WBC: 6.5 10*3/uL (ref 3.4–10.8)

## 2023-07-10 LAB — IRON,TIBC AND FERRITIN PANEL
Ferritin: 49 ng/mL (ref 30–400)
Iron Saturation: 28 % (ref 15–55)
Iron: 82 ug/dL (ref 38–169)
Total Iron Binding Capacity: 289 ug/dL (ref 250–450)
UIBC: 207 ug/dL (ref 111–343)

## 2023-07-10 LAB — HEMOGLOBIN A1C
Est. average glucose Bld gHb Est-mCnc: 134 mg/dL
Hgb A1c MFr Bld: 6.3 % — ABNORMAL HIGH (ref 4.8–5.6)

## 2023-07-10 LAB — VITAMIN D 25 HYDROXY (VIT D DEFICIENCY, FRACTURES): Vit D, 25-Hydroxy: 83.8 ng/mL (ref 30.0–100.0)

## 2023-07-17 ENCOUNTER — Ambulatory Visit (INDEPENDENT_AMBULATORY_CARE_PROVIDER_SITE_OTHER): Admitting: Family Medicine

## 2023-07-17 ENCOUNTER — Encounter (HOSPITAL_BASED_OUTPATIENT_CLINIC_OR_DEPARTMENT_OTHER): Payer: Self-pay | Admitting: Family Medicine

## 2023-07-17 VITALS — BP 167/94 | HR 79 | Ht 74.0 in | Wt 277.2 lb

## 2023-07-17 DIAGNOSIS — K591 Functional diarrhea: Secondary | ICD-10-CM | POA: Insufficient documentation

## 2023-07-17 DIAGNOSIS — K602 Anal fissure, unspecified: Secondary | ICD-10-CM | POA: Insufficient documentation

## 2023-07-17 DIAGNOSIS — E559 Vitamin D deficiency, unspecified: Secondary | ICD-10-CM | POA: Diagnosis not present

## 2023-07-17 DIAGNOSIS — G2581 Restless legs syndrome: Secondary | ICD-10-CM | POA: Diagnosis not present

## 2023-07-17 DIAGNOSIS — Z7984 Long term (current) use of oral hypoglycemic drugs: Secondary | ICD-10-CM

## 2023-07-17 DIAGNOSIS — R5383 Other fatigue: Secondary | ICD-10-CM | POA: Insufficient documentation

## 2023-07-17 DIAGNOSIS — E1142 Type 2 diabetes mellitus with diabetic polyneuropathy: Secondary | ICD-10-CM

## 2023-07-17 DIAGNOSIS — R195 Other fecal abnormalities: Secondary | ICD-10-CM | POA: Insufficient documentation

## 2023-07-17 DIAGNOSIS — R634 Abnormal weight loss: Secondary | ICD-10-CM | POA: Insufficient documentation

## 2023-07-17 DIAGNOSIS — R238 Other skin changes: Secondary | ICD-10-CM | POA: Diagnosis not present

## 2023-07-17 DIAGNOSIS — K2270 Barrett's esophagus without dysplasia: Secondary | ICD-10-CM | POA: Insufficient documentation

## 2023-07-17 DIAGNOSIS — Z79899 Other long term (current) drug therapy: Secondary | ICD-10-CM

## 2023-07-17 MED ORDER — ONETOUCH DELICA PLUS LANCET33G MISC: 3 refills | Status: DC

## 2023-07-17 MED ORDER — VITAMIN D (ERGOCALCIFEROL) 1.25 MG (50000 UNIT) PO CAPS: 50000.0000 [IU] | ORAL_CAPSULE | ORAL | 0 refills | Status: DC

## 2023-07-17 MED ORDER — ACYCLOVIR 400 MG PO TABS: 400.0000 mg | ORAL_TABLET | Freq: Every day | ORAL | 0 refills | Status: DC

## 2023-07-17 MED ORDER — ACYCLOVIR 400 MG PO TABS
400.0000 mg | ORAL_TABLET | Freq: Every day | ORAL | 0 refills | Status: DC
Start: 2023-07-17 — End: 2023-07-17

## 2023-07-17 NOTE — Assessment & Plan Note (Signed)
 Patient continues with duloxetine .  Does wonder about possibly adjusting medication for possible better control of symptoms. For now, can continue with current regimen.  Can monitor progress with symptoms to determine if change in therapy would be warranted.

## 2023-07-17 NOTE — Assessment & Plan Note (Signed)
 Has area over left posterior heel that has opened up.  He has been trying to keep area clean and covered with Band-Aid.  First noted about 2 weeks ago.  Has not had any significant drainage.  No significant pain with the area.  Not necessarily aware of any areas that may have been rubbing against, possibly related to footwear. On exam, patient does have open area that is about 2 cm in length and 1 cm in width.  There is some granulation tissue at base of wound.  No current drainage or discharge.  No foul smell.  No significant surrounding erythema. Location of area would suggest friction as potential cause for breakdown.  No signs of infection presently.  Discussed considerations.  Would recommend continuing to monitor area closely.  Recommend warm soapy water  for cleaning.SABRA  Utilize gauze with paper tape to keep area covered and to protect from abrasion.  Provided patient with the supplies.  He does have appointment with podiatry in about a month.  Feel that arrange an appointment with podiatry would be reasonable timing wise.  If having any issues before then, can contact our office to return sooner for further evaluation

## 2023-07-17 NOTE — Assessment & Plan Note (Signed)
 Hemoglobin A1c has been well-controlled with current regimen of Trulicity , metformin .  No current issues with medications at the present moment.  Recent A1c continues to be well-controlled at 6.3%.  We can continue with intermittent monitoring. Plan to complete foot exam at next office visit. Likely check urine ACR at next visit as well as it was last completed at the very end of 2024

## 2023-07-17 NOTE — Progress Notes (Signed)
    Procedures performed today:    None.  Independent interpretation of notes and tests performed by another provider:   None.  Brief History, Exam, Impression, and Recommendations:    BP (!) 167/94 (BP Location: Left Arm, Patient Position: Sitting, Cuff Size: Normal)   Pulse 79   Ht 6' 2 (1.88 m)   Wt 277 lb 3.2 oz (125.7 kg)   SpO2 97%   BMI 35.59 kg/m   RLS Assessment & Plan: Patient continues with duloxetine .  Does wonder about possibly adjusting medication for possible better control of symptoms. For now, can continue with current regimen.  Can monitor progress with symptoms to determine if change in therapy would be warranted.   Vitamin D  deficiency -     Vitamin D  (Ergocalciferol ); Take 1 capsule (50,000 Units total) by mouth every 7 (seven) days.  Dispense: 12 capsule; Refill: 0  Medication management -     Vitamin D  (Ergocalciferol ); Take 1 capsule (50,000 Units total) by mouth every 7 (seven) days.  Dispense: 12 capsule; Refill: 0  Skin breakdown Assessment & Plan: Has area over left posterior heel that has opened up.  He has been trying to keep area clean and covered with Band-Aid.  First noted about 2 weeks ago.  Has not had any significant drainage.  No significant pain with the area.  Not necessarily aware of any areas that may have been rubbing against, possibly related to footwear. On exam, patient does have open area that is about 2 cm in length and 1 cm in width.  There is some granulation tissue at base of wound.  No current drainage or discharge.  No foul smell.  No significant surrounding erythema. Location of area would suggest friction as potential cause for breakdown.  No signs of infection presently.  Discussed considerations.  Would recommend continuing to monitor area closely.  Recommend warm soapy water  for cleaning.SABRA  Utilize gauze with paper tape to keep area covered and to protect from abrasion.  Provided patient with the supplies.  He does have  appointment with podiatry in about a month.  Feel that arrange an appointment with podiatry would be reasonable timing wise.  If having any issues before then, can contact our office to return sooner for further evaluation   DM type 2 with diabetic peripheral neuropathy (HCC) Assessment & Plan: Hemoglobin A1c has been well-controlled with current regimen of Trulicity , metformin .  No current issues with medications at the present moment.  Recent A1c continues to be well-controlled at 6.3%.  We can continue with intermittent monitoring. Plan to complete foot exam at next office visit. Likely check urine ACR at next visit as well as it was last completed at the very end of 2024  Orders: -     OneTouch Delica Plus Lancet33G; USE WITH METER TO CHECK BLOOD  SUGAR ONCE DAILY  Dispense: 100 each; Refill: 3  Other orders -     Acyclovir ; Take 1 tablet (400 mg total) by mouth daily.  Dispense: 90 tablet; Refill: 0  Return in about 3 months (around 10/17/2023) for diabetes, hypertension.   ___________________________________________ Kenadi Miltner de [place], MD, ABFM, CAQSM Primary Care and Sports Medicine [facility]

## 2023-07-17 NOTE — Patient Instructions (Signed)
  Medication Instructions:  Your physician recommends that you continue on your current medications as directed. Please refer to the Current Medication list given to you today. --If you need a refill on any your medications before your next appointment, please call your pharmacy first. If no refills are authorized on file call the office.--   Follow-Up: Your next appointment:   Your physician recommends that you schedule a follow-up appointment in: 3-4 months follow up  with Dr. de [place]  You will receive a text message or e-mail with a link to a survey about your care and experience with [place] today! We would greatly appreciate your feedback!   Thanks for letting us be apart of your health journey!!  Primary Care and Sports Medicine   Dr. Ceasar Mons [place]   We encourage you to activate your patient portal called "MyChart".  Sign up information is provided on this After Visit Summary.  MyChart is used to connect with patients for Virtual Visits (Telemedicine).  Patients are able to view lab/test results, encounter notes, upcoming appointments, etc.  Non-urgent messages can be sent to your provider as well. To learn more about what you can do with MyChart, please visit --  [email or web address].

## 2023-07-24 ENCOUNTER — Other Ambulatory Visit (HOSPITAL_BASED_OUTPATIENT_CLINIC_OR_DEPARTMENT_OTHER): Payer: Self-pay | Admitting: *Deleted

## 2023-07-24 ENCOUNTER — Telehealth (HOSPITAL_BASED_OUTPATIENT_CLINIC_OR_DEPARTMENT_OTHER): Payer: Self-pay | Admitting: Family Medicine

## 2023-07-24 NOTE — Telephone Encounter (Signed)
 [name] came by the office to give us  a new insurance request to change his one touch meter and test strips due to insurance coverage change.  The letter is in the box. Please advise.  Also he is requesting a handicapped placard form to take to the [facility].  Thank you.

## 2023-07-24 NOTE — Telephone Encounter (Signed)
 Letter placed in provider box to change prescription and handicap placard ready to be picked up

## 2023-07-25 ENCOUNTER — Other Ambulatory Visit (HOSPITAL_BASED_OUTPATIENT_CLINIC_OR_DEPARTMENT_OTHER): Payer: Self-pay | Admitting: *Deleted

## 2023-07-25 MED ORDER — BLOOD GLUCOSE MONITORING SUPPL DEVI: 1.0000 | Freq: Three times a day (TID) | 0 refills | Status: AC

## 2023-07-25 MED ORDER — LANCETS MISC. MISC: 1.0000 | Freq: Three times a day (TID) | 0 refills | Status: AC

## 2023-07-29 ENCOUNTER — Other Ambulatory Visit (HOSPITAL_BASED_OUTPATIENT_CLINIC_OR_DEPARTMENT_OTHER): Payer: Self-pay | Admitting: Family Medicine

## 2023-07-29 DIAGNOSIS — E1142 Type 2 diabetes mellitus with diabetic polyneuropathy: Secondary | ICD-10-CM

## 2023-08-20 ENCOUNTER — Encounter (HOSPITAL_BASED_OUTPATIENT_CLINIC_OR_DEPARTMENT_OTHER): Payer: Self-pay | Admitting: Family Medicine

## 2023-08-20 ENCOUNTER — Other Ambulatory Visit (HOSPITAL_BASED_OUTPATIENT_CLINIC_OR_DEPARTMENT_OTHER): Payer: Self-pay | Admitting: *Deleted

## 2023-08-20 DIAGNOSIS — R251 Tremor, unspecified: Secondary | ICD-10-CM

## 2023-08-20 MED ORDER — PROPRANOLOL HCL 40 MG PO TABS
40.0000 mg | ORAL_TABLET | Freq: Every day | ORAL | 0 refills | Status: DC
Start: 2023-08-20 — End: 2023-09-13

## 2023-09-11 ENCOUNTER — Ambulatory Visit: Admitting: Podiatry

## 2023-09-11 DIAGNOSIS — B351 Tinea unguium: Secondary | ICD-10-CM

## 2023-09-11 DIAGNOSIS — E1142 Type 2 diabetes mellitus with diabetic polyneuropathy: Secondary | ICD-10-CM

## 2023-09-11 DIAGNOSIS — D2372 Other benign neoplasm of skin of left lower limb, including hip: Secondary | ICD-10-CM

## 2023-09-11 DIAGNOSIS — M79676 Pain in unspecified toe(s): Secondary | ICD-10-CM | POA: Diagnosis not present

## 2023-09-11 DIAGNOSIS — D2371 Other benign neoplasm of skin of right lower limb, including hip: Secondary | ICD-10-CM

## 2023-09-11 DIAGNOSIS — M2042 Other hammer toe(s) (acquired), left foot: Secondary | ICD-10-CM

## 2023-09-11 DIAGNOSIS — M7671 Peroneal tendinitis, right leg: Secondary | ICD-10-CM

## 2023-09-11 DIAGNOSIS — M2041 Other hammer toe(s) (acquired), right foot: Secondary | ICD-10-CM

## 2023-09-11 MED ORDER — TRIAMCINOLONE ACETONIDE 40 MG/ML IJ SUSP
20.0000 mg | Freq: Once | INTRAMUSCULAR | Status: AC
  Administered 2023-09-11: 20 mg

## 2023-09-11 NOTE — Progress Notes (Unsigned)
 Presents today chief complaint of painful elongated toenails and calluses bilaterally.  Also complaining of painful lateral ankles most of note he may have torn his right peroneal tendon or his Achilles tendon.  He is also requesting diabetic shoes.  Objective: Vital signs stable alert oriented x 3.  Pulses are palpable.  Neuropathy is unchanged no open lesions or wounds.  He does have tenderness around the peroneal tendons that radiates up his lateral leg and up into his buttocks very well could be associated with the sciatica.  He has no pain on abduction against resistance or eversion and plantarflexion.  His Achilles appears to be intact slightly thickened but does not demonstrate any voids.  Good plantarflexion against resistance.  Multiple thick calluses with fissures.  Toenails are long thick yellow dystrophic onychomycotic there are no open lesions or wounds.  Assessment: Pain in limb secondary to onychomycosis diabetic peripheral neuropathy and benign skin lesions with fissures.  Peroneal tendinitis.  Hammertoes with diabetic peripheral neuropathy  Plan: Debridement of all benign skin lesions fissures and nails.  Follow-up with him in 3 months.  Injected 5 mg of Kenalog  around the peroneal tendon sheath making sure not to inject into the tendons just posterior to the lateral malleolus.  We are [place] by getting him some diabetic shoes set up.

## 2023-09-12 ENCOUNTER — Other Ambulatory Visit (HOSPITAL_BASED_OUTPATIENT_CLINIC_OR_DEPARTMENT_OTHER): Payer: Self-pay | Admitting: Family Medicine

## 2023-09-12 DIAGNOSIS — R251 Tremor, unspecified: Secondary | ICD-10-CM

## 2023-09-17 NOTE — Progress Notes (Unsigned)
 Cardiology Office Note    Date:  09/19/2023  ID:  Jason Wyatt, DOB 1955-11-19, MRN [id number] PCP:  de [place], Raymond J, MD  Cardiologist:  Dorn Lesches, MD  Electrophysiologist:  None   Chief Complaint: Follow up for CAD   History of Present Illness: .    Jason Wyatt is a 68 y.o. male with visit-pertinent history of PMH of hypertension, peripheral vascular disease, OSA on CPAP, type 2 diabetes, hyperlipidemia, RLS, gait instability, insomnia, shortness of breath, and obesity.  He underwent cardiac catheterization in 2013 which was normal.  His echocardiogram in 2020 showed mild LVH and diastolic dysfunction.  He is followed by Dr. Lesches.  He was seen by Dr. [place] on 09/09/2021.  During that time he was being evaluated for shortness of breath.  He reported dyspnea and chest tightness with exertion.  His symptoms had been ongoing for more than a year.  He reported having COVID the prior year.  He reported that his symptoms had started before he contracted COVID.  He was unable to do housework without stopping to rest.  He had no lower extremity swelling.  He had lost weight.  He reported rare palpitations.  He was using his CPAP regularly.  He was having increased stress with his wife being on hospice.  A echocardiogram repeated at that time showed an LVEF of 60-65%, G1 DD, mild dilation of aortic root measuring 39 mm and ascending aortic dilation measuring 41 mm.  He had coronary CTA on 09/29/2021.  He was noted to have a coronary calcium  score of 55 which placed him in 38 percentile for age and sex matched control.  He was noted to have mid stenosis of his LAD 30-49% and medical management was recommended.  Patient was last seen in clinic on 09/19/2022.  Patient reported that he was doing well at that time.  He denied any cardiac concerns or complaints.  Today he presents for follow-up.  He reports that he has been doing well overall. He notes occasional problems with right hand arthritis. He  denies chest pain, notes some dyspnea on exertion, questions if it has been worse in recent months.  He notes when he walks to his car he does become slightly short of breath, denies any chest pain, tightness or discomfort.  He feels this is possibly related to some deconditioning.  He notes some problems with anxiety and joint pain recently which he feels is related to arthritis, resolves with tylenol . He denies any palpitations or feeling of irregular heart beats.  Labwork independently reviewed: 07/09/2023: Hemoglobin 13.8, hematocrit 40.8, sodium 141, potassium 4.7, creatinine 0.92, AST 14, ALT 13 ROS: .   Today he denies chest pain, lower extremity edema, fatigue, palpitations, melena, hematuria, hemoptysis, diaphoresis, weakness, presyncope, syncope, orthopnea, and PND.  All other systems are reviewed and otherwise negative. Studies Reviewed: SABRA   EKG:  EKG is ordered today, personally reviewed, demonstrating  EKG Interpretation Date/Time:  Wednesday September 19 2023 09:10:00 EDT Ventricular Rate:  78 PR Interval:  200 QRS Duration:  138 QT Interval:  394 QTC Calculation: 449 R Axis:   -79  Text Interpretation: Normal sinus rhythm Left axis deviation Right bundle branch block Possible Lateral infarct (cited on or before 16-Aug-2020) Inferior infarct (cited on or before 03-Aug-2011) Confirmed by Daphnie Venturini [phone]) on 09/19/2023 1:04:47 PM   CV Studies: Cardiac studies reviewed are outlined and summarized above. Otherwise please see EMR for full report. Cardiac Studies & Procedures  ______________________________________________________________________________________________     ECHOCARDIOGRAM  ECHOCARDIOGRAM COMPLETE 09/21/2021  Narrative ECHOCARDIOGRAM REPORT    Patient Name:   Jason Wyatt Date of Exam: 09/21/2021 Medical Rec #:  [id number]      Height:       74.0 in Accession #:    [id number]     Weight:       293.2 lb Date of Birth:  [birth year]       BSA:          2.557  m Patient Age:    68 years       BP:           122/64 mmHg Patient Gender: M              HR:           85 bpm. Exam Location:  [address]  Procedure: 2D Echo, Cardiac Doppler, Color Doppler and Intracardiac Opacification Agent  Indications:    Dyspnea R06.00  History:        Patient has prior history of Echocardiogram examinations, most recent 03/06/2018. Risk Factors:Hypertension, Diabetes and Dyslipidemia.  Sonographer:    Augustin Seals RDCS Referring Phys: MAUDE HERO [place]  IMPRESSIONS   1. Left ventricular ejection fraction, by estimation, is 60 to 65%. The left ventricle has normal function. The left ventricle has no regional wall motion abnormalities. There is moderate concentric left ventricular hypertrophy. Left ventricular diastolic parameters are consistent with Grade I diastolic dysfunction (impaired relaxation). 2. Right ventricular systolic function is normal. The right ventricular size is normal. Tricuspid regurgitation signal is inadequate for assessing PA pressure. 3. The mitral valve is normal in structure. No evidence of mitral valve regurgitation. No evidence of mitral stenosis. 4. The aortic valve was not well visualized. Aortic valve regurgitation is not visualized. No aortic stenosis is present. 5. Aortic dilatation noted. There is mild dilatation of the aortic root, measuring 39 mm. There is mild dilatation of the ascending aorta, measuring 41 mm.  FINDINGS Left Ventricle: Left ventricular ejection fraction, by estimation, is 60 to 65%. The left ventricle has normal function. The left ventricle has no regional wall motion abnormalities. Definity  contrast agent was given IV to delineate the left ventricular endocardial borders. The left ventricular internal cavity size was normal in size. There is moderate concentric left ventricular hypertrophy. Left ventricular diastolic parameters are consistent with Grade I diastolic dysfunction (impaired  relaxation). Normal left ventricular filling pressure.  Right Ventricle: The right ventricular size is normal. No increase in right ventricular wall thickness. Right ventricular systolic function is normal. Tricuspid regurgitation signal is inadequate for assessing PA pressure.  Left Atrium: Left atrial size was normal in size.  Right Atrium: Right atrial size was normal in size.  Pericardium: There is no evidence of pericardial effusion.  Mitral Valve: The mitral valve is normal in structure. No evidence of mitral valve regurgitation. No evidence of mitral valve stenosis.  Tricuspid Valve: The tricuspid valve is normal in structure. Tricuspid valve regurgitation is not demonstrated. No evidence of tricuspid stenosis.  Aortic Valve: The aortic valve was not well visualized. Aortic valve regurgitation is not visualized. No aortic stenosis is present.  Pulmonic Valve: The pulmonic valve was normal in structure. Pulmonic valve regurgitation is not visualized. No evidence of pulmonic stenosis.  Aorta: Aortic dilatation noted. There is mild dilatation of the aortic root, measuring 39 mm. There is mild dilatation of the ascending aorta, measuring 41 mm.  Venous: The inferior vena cava was not  well visualized.  IAS/Shunts: No atrial level shunt detected by color flow Doppler.   LEFT VENTRICLE PLAX 2D LVIDd:         4.70 cm   Diastology LVIDs:         2.80 cm   LV e' medial:    5.98 cm/s LV PW:         1.50 cm   LV E/e' medial:  11.0 LV IVS:        1.60 cm   LV e' lateral:   8.27 cm/s LVOT diam:     2.70 cm   LV E/e' lateral: 7.9 LV SV:         112 LV SV Index:   44 LVOT Area:     5.73 cm   RIGHT VENTRICLE RV Basal diam:  3.20 cm RV Mid diam:    3.50 cm RV S prime:     10.80 cm/s TAPSE (M-mode): 1.9 cm  LEFT ATRIUM             Index        RIGHT ATRIUM           Index LA diam:        3.40 cm 1.33 cm/m   RA Area:     22.80 cm LA Vol (A2C):   39.8 ml 15.56 ml/m  RA Volume:    69.80 ml  27.30 ml/m LA Vol (A4C):   62.3 ml 24.36 ml/m LA Biplane Vol: 54.3 ml 21.23 ml/m AORTIC VALVE LVOT Vmax:   104.00 cm/s LVOT Vmean:  67.100 cm/s LVOT VTI:    0.195 m  AORTA Ao Root diam: 3.90 cm Ao Asc diam:  4.10 cm  MITRAL VALVE MV Area (PHT): 3.50 cm    SHUNTS MV Decel Time: 217 msec    Systemic VTI:  0.20 m MV E velocity: 65.50 cm/s  Systemic Diam: 2.70 cm MV A velocity: 86.50 cm/s MV E/A ratio:  0.76  Wilbert Bihari MD Electronically signed by Wilbert Bihari MD Signature Date/Time: 09/21/2021/9:59:57 AM    Final      CT SCANS  CT CORONARY MORPH W/CTA COR W/SCORE 09/29/2021  Addendum 09/29/2021  1:50 PM ADDENDUM REPORT: 09/29/2021 13:48  EXAM: OVER-READ INTERPRETATION  CT CHEST  The following report is an over-read performed by radiologist Dr. Lynwood Seip [facility] on 09/29/2021. This over-read does not include interpretation of cardiac or coronary anatomy or pathology. The coronary CTA interpretation by the cardiologist is attached.  COMPARISON:  August 03, 2011.  FINDINGS: 4 cm ascending thoracic aortic aneurysm is noted. No definite abnormality is seen involving the visualized mediastinum. No definite abnormality seen involving the visualized portion of the upper abdomen. Visualized pulmonary parenchyma is unremarkable. Visualized skeleton is unremarkable.  IMPRESSION: 4 cm ascending thoracic aortic aneurysm. Recommend annual imaging followup by CTA or MRA. This recommendation follows 2010 ACCF/AHA/AATS/ACR/ASA/SCA/SCAI/SIR/STS/SVM Guidelines for the Diagnosis and Management of Patients with Thoracic Aortic Disease. Circulation. 2010; 121: Z733-z630. Aortic aneurysm NOS (ICD10-I71.9). These results will be called to the ordering clinician or representative by the Radiologist Assistant, and communication documented in the PACS or zVision Dashboard.   Electronically Signed By: Lynwood Seip Raddle M.D. On: 09/29/2021  13:48  Narrative CLINICAL DATA:  68 year old with chest pain.  EXAM: Cardiac/Coronary  CTA  TECHNIQUE: The patient was scanned on a [employer].  FINDINGS: A 120 kV prospective scan was triggered in the descending thoracic aorta at 111 HU's. Axial non-contrast 3 mm  slices were carried out through the heart. The data set was analyzed on a dedicated work station and scored using the Agatson method. Gantry rotation speed was 250 msecs and collimation was .6 mm. 0.8 mg of sl NTG was given. The 3D data set was reconstructed in 5% intervals of the 67-82 % of the R-R cycle. Diastolic phases were analyzed on a dedicated work station using MPR, MIP and VRT modes. The patient received 80 cc of contrast.  Image quality: Good, mild misregistration artifact. Distal vessels are small in caliber and difficult to visualize.  Aorta:  Normal size.  No calcifications.  No dissection.  Aortic Valve: No calcifications.  Coronary Arteries:  Normal coronary origin.  Right dominance.  RCA is a large dominant artery that gives rise to PDA and PLA. There is no plaque.  Left main is a large artery that gives rise to LAD and Ramus/LCX arteries.  LAD is a large vessel that has focal mid calcified plaque. Mid mild stenosis 30-49%  LCX is a non-dominant artery that gives rise to one large OM1 branch. There is no plaque.  Other findings:  Normal pulmonary vein drainage into the left atrium.  Normal left atrial appendage without a thrombus.  Normal size of the pulmonary artery.  Please see radiology report for non cardiac findings.  IMPRESSION: 1. Coronary calcium  score of 55. This was 11 percentile for age and sex matched control.  2. Normal coronary origin with right dominance.  3. LAD is a large vessel that has focal mid calcified plaque. Mid mild stenosis 30-49%.  CAD-RADS 2. Mild non-obstructive CAD (25-49%). Consider non-atherosclerotic causes of chest pain. Consider  preventive therapy and risk factor modification.  Electronically Signed: By: Oneil Parchment M.D. On: 09/29/2021 13:20     ______________________________________________________________________________________________       Current Reported Medications:.    Current Meds  Medication Sig   acyclovir  (ZOVIRAX ) 400 MG tablet TAKE 1 TABLET BY MOUTH DAILY   aspirin  81 MG tablet Take 81 mg by mouth daily.   atorvastatin  (LIPITOR) 20 MG tablet Take 1 tablet (20 mg total) by mouth daily.   Blood Glucose Monitoring Suppl (ACCU-CHEK GUIDE) w/Device KIT USE TO TEST IN THE MORNING , AT  NOON AND AT BEDTIME   Blood Glucose Monitoring Suppl DEVI 1 each by Does not apply route in the morning, at noon, and at bedtime. May substitute to any manufacturer covered by patient's insurance.   Cyanocobalamin (B-12) 2500 MCG SUBL Place under the tongue every other day. 4 times a week   dextromethorphan-guaiFENesin  (MUCINEX  DM) 30-600 MG 12hr tablet Take 1 tablet by mouth 2 (two) times daily as needed for cough.   DULoxetine  (CYMBALTA ) 60 MG capsule TAKE 1 CAPSULE BY MOUTH AT  BEDTIME   ferrous sulfate 325 (65 FE) MG tablet Take 325 mg by mouth daily with breakfast.   fexofenadine (ALLEGRA) 180 MG tablet Take 180 mg by mouth daily as needed.   gabapentin  (NEURONTIN ) 600 MG tablet Take 1 tablet (600 mg total) by mouth 2 (two) times daily.   glucose blood (ONETOUCH ULTRA) test strip USE TO CHECK BLOOD SUGAR  DAILY   Magnesium  400 MG CAPS Take 800 mg by mouth at bedtime.   meloxicam  (MOBIC ) 7.5 MG tablet Take 1 tablet Daily  with Food  is needed for Pain   methocarbamol  (ROBAXIN ) 500 MG tablet Take 500 mg by mouth 3 (three) times daily as needed. PRN   Multiple Vitamin (MULTIVITAMIN) tablet Take 1 tablet by  mouth daily.   Naphazoline-Pheniramine (EYE ALLERGY RELIEF OP) Apply to eye.   Omega 3 1200 MG CAPS Take 1 capsule by mouth daily.   pantoprazole  (PROTONIX ) 40 MG tablet TAKE 1 TABLET DAILY TO  PREVENT HEARTBURN  &amp;  INDIGESTION   propranolol  (INDERAL ) 40 MG tablet TAKE 1 TABLET BY MOUTH DAILY   TRULICITY  4.5 MG/0.5ML SOPN Inject 1 pen  (4.5 mg) into Skin every 7 days   Vitamin D , Ergocalciferol , (DRISDOL ) 1.25 MG (50000 UNIT) CAPS capsule TAKE 1 CAPSULE BY MOUTH EVERY 7  DAYS   [DISCONTINUED] Accu-Chek Softclix Lancets lancets USE 1 LANCET IN THE MORNING , AT NOON AND AT BEDTIME   [DISCONTINUED] allopurinol  (ZYLOPRIM ) 300 MG tablet TAKE 1 TABLET BY MOUTH DAILY FOR GOUT PREVENTION   [DISCONTINUED] carbidopa -levodopa  (SINEMET  IR) 25-250 MG tablet TAKE 1 TABLET BY MOUTH TWICE  DAILY FOR RESTLESS LEGS   [DISCONTINUED] fluticasone  (FLONASE ) 50 MCG/ACT nasal spray USE 2 SPRAYS IN BOTH NOSTRILS  ONCE DAILY   [DISCONTINUED] metFORMIN  (GLUCOPHAGE -XR) 500 MG 24 hr tablet TAKE 2 TABLETS BY MOUTH TWICE  DAILY WITH MEALS FOR DIABETES   [DISCONTINUED] olmesartan  (BENICAR ) 40 MG tablet TAKE 1 TABLET BY MOUTH AT NIGHT  FOR BLOOD PRESSURE AND DIABETIC  KIDNEY PROTECTION   [DISCONTINUED] tamsulosin  (FLOMAX ) 0.4 MG CAPS capsule TAKE 2 CAPSULES BY MOUTH AT  BEDTIME FOR PROSTATE    Physical Exam:    VS:  BP 124/74   Pulse 74   Ht 6' 2 (1.88 m)   Wt 277 lb 3.2 oz (125.7 kg)   SpO2 100%   BMI 35.59 kg/m    Wt Readings from Last 3 Encounters:  09/19/23 277 lb 3.2 oz (125.7 kg)  07/17/23 277 lb 3.2 oz (125.7 kg)  04/18/23 273 lb (123.8 kg)    GEN: Well nourished, well developed in no acute distress NECK: No JVD; No carotid bruits CARDIAC: RRR, no murmurs, rubs, gallops RESPIRATORY:  Clear to auscultation without rales, wheezing or rhonchi  ABDOMEN: Soft, non-tender, non-distended EXTREMITIES:  No edema; No acute deformity     Asessement and Plan:.    Nonobstructive CAD/shortness of breath: Coronary CTA 09/29/2021 indicated a coronary calcium  score of 55, this was 38 percentile for age and sex matched control, normal coronary origin with right dominance, mid mild stenosis of the LAD at 30 to 49%. Today patient  denies any chest pain, tightness or discomfort.  He does report some increased dyspnea on exertion that he is unsure if it is related to anxiety, arthritis or deconditioning.  Patient agreeable to echocardiogram, if unrevealing is open to repeating coronary CTA.  Reviewed ED precautions. Heart healthy diet and regular cardiovascular exercise encouraged.  Continue aspirin  81 mg daily, Lipitor 20 mg daily, olmesartan  40 mg daily and propranolol  40 mg daily.  Aortic dilation: CT angio chest aorta on 11//24 indicated stable mild fusiform aneurysmal dilation of the ascending thoracic aorta measuring up to 41 mm in greatest oblique short axis dimension, unchanged compared to 2013.  Will check echocardiogram as noted above, if any significant changes we will plan for CT angio chest aorta at that time.  HTN: Blood pressure today 124/74.  Continue current antihypertensive regimen.  Type 2 DM: Last hemoglobin A1C 6.3% on 07/09/2023.  Monitored and managed per PCP.  HLD: Last lipid profile on 07/09/2023 indicated total cholesterol 98, HDL 35, triglycerides 160 and LDL 36.  Continue atorvastatin  20 mg daily.   Disposition: F/u with Lenia Housley, NP in 8 weeks or sooner if  needed.   Signed, Lindy Pennisi D Shaylynne Lunt, NP

## 2023-09-18 ENCOUNTER — Encounter (HOSPITAL_BASED_OUTPATIENT_CLINIC_OR_DEPARTMENT_OTHER): Payer: Self-pay | Admitting: Family Medicine

## 2023-09-18 ENCOUNTER — Other Ambulatory Visit (HOSPITAL_BASED_OUTPATIENT_CLINIC_OR_DEPARTMENT_OTHER): Payer: Self-pay | Admitting: Family Medicine

## 2023-09-18 ENCOUNTER — Other Ambulatory Visit: Payer: Self-pay | Admitting: Nurse Practitioner

## 2023-09-18 DIAGNOSIS — E559 Vitamin D deficiency, unspecified: Secondary | ICD-10-CM

## 2023-09-18 DIAGNOSIS — Z79899 Other long term (current) drug therapy: Secondary | ICD-10-CM

## 2023-09-18 DIAGNOSIS — M5489 Other dorsalgia: Secondary | ICD-10-CM

## 2023-09-19 ENCOUNTER — Other Ambulatory Visit (HOSPITAL_BASED_OUTPATIENT_CLINIC_OR_DEPARTMENT_OTHER): Payer: Self-pay | Admitting: *Deleted

## 2023-09-19 ENCOUNTER — Ambulatory Visit: Attending: Cardiology | Admitting: Cardiology

## 2023-09-19 ENCOUNTER — Encounter: Payer: Self-pay | Admitting: Cardiology

## 2023-09-19 VITALS — BP 124/74 | HR 74 | Ht 74.0 in | Wt 277.2 lb

## 2023-09-19 DIAGNOSIS — I10 Essential (primary) hypertension: Secondary | ICD-10-CM

## 2023-09-19 DIAGNOSIS — R0602 Shortness of breath: Secondary | ICD-10-CM

## 2023-09-19 DIAGNOSIS — I7121 Aneurysm of the ascending aorta, without rupture: Secondary | ICD-10-CM | POA: Diagnosis not present

## 2023-09-19 DIAGNOSIS — I77810 Thoracic aortic ectasia: Secondary | ICD-10-CM

## 2023-09-19 DIAGNOSIS — N138 Other obstructive and reflux uropathy: Secondary | ICD-10-CM

## 2023-09-19 DIAGNOSIS — I2510 Atherosclerotic heart disease of native coronary artery without angina pectoris: Secondary | ICD-10-CM

## 2023-09-19 DIAGNOSIS — E119 Type 2 diabetes mellitus without complications: Secondary | ICD-10-CM | POA: Diagnosis not present

## 2023-09-19 DIAGNOSIS — E782 Mixed hyperlipidemia: Secondary | ICD-10-CM

## 2023-09-19 DIAGNOSIS — E1142 Type 2 diabetes mellitus with diabetic polyneuropathy: Secondary | ICD-10-CM

## 2023-09-19 DIAGNOSIS — J301 Allergic rhinitis due to pollen: Secondary | ICD-10-CM

## 2023-09-19 DIAGNOSIS — E1122 Type 2 diabetes mellitus with diabetic chronic kidney disease: Secondary | ICD-10-CM

## 2023-09-19 MED ORDER — ALLOPURINOL 300 MG PO TABS
ORAL_TABLET | ORAL | 3 refills | Status: AC
Start: 2023-09-19 — End: ?

## 2023-09-19 MED ORDER — TAMSULOSIN HCL 0.4 MG PO CAPS: ORAL_CAPSULE | ORAL | 3 refills | Status: AC

## 2023-09-19 MED ORDER — ACCU-CHEK SOFTCLIX LANCETS MISC: 9 refills | Status: AC

## 2023-09-19 MED ORDER — CARBIDOPA-LEVODOPA 25-250 MG PO TABS: ORAL_TABLET | ORAL | 3 refills | Status: AC

## 2023-09-19 MED ORDER — METFORMIN HCL ER 500 MG PO TB24: ORAL_TABLET | ORAL | 3 refills | Status: AC

## 2023-09-19 MED ORDER — OLMESARTAN MEDOXOMIL 40 MG PO TABS: ORAL_TABLET | ORAL | 3 refills | Status: AC

## 2023-09-19 MED ORDER — FLUTICASONE PROPIONATE 50 MCG/ACT NA SUSP: NASAL | 2 refills | Status: AC

## 2023-09-19 NOTE — Patient Instructions (Signed)
 Medication Instructions:  No changes *If you need a refill on your cardiac medications before your next appointment, please call your pharmacy*  Lab Work: No labs  Testing/Procedures: Your physician has requested that you have an echocardiogram. Echocardiography is a painless test that uses sound waves to create images of your heart. It provides your doctor with information about the size and shape of your heart and how well your heart's chambers and valves are working. This procedure takes approximately one hour. There are no restrictions for this procedure. Please do NOT wear cologne, perfume, aftershave, or lotions (deodorant is allowed). Please arrive 15 minutes prior to your appointment time.  Please note: We ask at that you not bring children with you during ultrasound (echo/ vascular) testing. Due to room size and safety concerns, children are not allowed in the ultrasound rooms during exams. Our front office staff cannot provide observation of children in our lobby area while testing is being conducted. An adult accompanying a patient to their appointment will only be allowed in the ultrasound room at the discretion of the ultrasound technician under special circumstances. We apologize for any inconvenience.  Follow-Up: At [facility], you and your health needs are our priority.  As part of our continuing mission to provide you with exceptional heart care, our providers are all part of one team.  This team includes your primary Cardiologist (physician) and Advanced Practice Providers or APPs (Physician Assistants and Nurse Practitioners) who all work together to provide you with the care you need, when you need it.  Your next appointment:   8 week(s)  Provider:   Katlyn West, NP

## 2023-09-25 ENCOUNTER — Telehealth (HOSPITAL_BASED_OUTPATIENT_CLINIC_OR_DEPARTMENT_OTHER): Payer: Self-pay | Admitting: *Deleted

## 2023-09-25 ENCOUNTER — Telehealth: Payer: Self-pay | Admitting: Lab

## 2023-09-25 ENCOUNTER — Encounter: Payer: Self-pay | Admitting: Podiatry

## 2023-09-25 NOTE — Telephone Encounter (Signed)
 Please advise if you would like to provide note for jury duty

## 2023-09-25 NOTE — Telephone Encounter (Signed)
 Copied from CRM #[id number]. Topic: Clinical - Order For Equipment >> Sep 18, 2023  8:21 AM Delon HERO wrote: Reason for CRM: Patient is calling to see if the forms for his diabetic shoes is available to pick up. Please advise

## 2023-09-25 NOTE — Telephone Encounter (Signed)
 Do you have patients order for diabetic shoes?

## 2023-09-25 NOTE — Telephone Encounter (Signed)
 Copied from CRM [id number]. Topic: General - Other >> Sep 25, 2023  8:20 AM Jason Wyatt wrote: Reason for CRM: Patient called.. wants an excuse from [employer].. He doesn't walk well and doesn't drive.  10/15 is the Jury date.SABRA Pls call him.SABRA

## 2023-09-25 NOTE — Telephone Encounter (Signed)
 Patient is requesting note to excuse him from jury duty due to his severe neuropathy and unable to walk distances. Court date is 10/15 2025 will need note a week prior to date to have time to submit to the court.

## 2023-09-25 NOTE — Telephone Encounter (Signed)
 Per note from Irma pt is needing letter to be excused from [employer]. Sent via [employer]

## 2023-09-27 ENCOUNTER — Encounter (HOSPITAL_BASED_OUTPATIENT_CLINIC_OR_DEPARTMENT_OTHER): Payer: Self-pay | Admitting: *Deleted

## 2023-09-27 NOTE — Telephone Encounter (Signed)
 Patient notified with verbal understanding that the letter is in mychart

## 2023-09-27 NOTE — Telephone Encounter (Signed)
 Form received placed in providers box for signature

## 2023-09-27 NOTE — Telephone Encounter (Signed)
 Pt sent MyChart note he could not see his Jury Dute(excuse note) on MyChart. I emailed it to email addr on file. I sent mess via MyChart to adv sent and to call office [place] option# 3  about his shoes.

## 2023-09-28 NOTE — Telephone Encounter (Signed)
 Thanks

## 2023-10-03 ENCOUNTER — Telehealth: Payer: Self-pay

## 2023-10-03 NOTE — Telephone Encounter (Signed)
 Copied from CRM [id number]. Topic: General - Other >> Oct 03, 2023 10:43 AM Tiffini S wrote: Reason for CRM: Arlyne with [employer] called about a referral for diabetic shoes and inserts - need encounter notes for 09/27/23- fax number [phone]  Patient have appointment today at 1:00pm- if any questions please call [phone]

## 2023-10-23 ENCOUNTER — Ambulatory Visit (HOSPITAL_COMMUNITY)
Admission: RE | Admit: 2023-10-23 | Discharge: 2023-10-23 | Disposition: A | Discharge status: Home / Self Care | Source: Ambulatory Visit | Attending: Student in an Organized Health Care Education/Training Program | Admitting: Student in an Organized Health Care Education/Training Program

## 2023-10-23 DIAGNOSIS — R0602 Shortness of breath: Secondary | ICD-10-CM | POA: Diagnosis not present

## 2023-10-23 LAB — ECHOCARDIOGRAM COMPLETE
Area-P 1/2: 3.87 cm2
S' Lateral: 3.2 cm

## 2023-10-23 MED ORDER — PERFLUTREN LIPID MICROSPHERE
1.0000 mL | INTRAVENOUS | Status: AC | PRN
  Administered 2023-10-23: 1 mL via INTRAVENOUS

## 2023-10-25 ENCOUNTER — Ambulatory Visit: Payer: Medicare Other | Admitting: Neurology

## 2023-10-25 ENCOUNTER — Encounter: Payer: Self-pay | Admitting: Neurology

## 2023-10-25 VITALS — BP 153/91 | HR 69 | Ht 74.0 in | Wt 277.6 lb

## 2023-10-25 DIAGNOSIS — Z9989 Dependence on other enabling machines and devices: Secondary | ICD-10-CM | POA: Diagnosis not present

## 2023-10-25 DIAGNOSIS — G4733 Obstructive sleep apnea (adult) (pediatric): Secondary | ICD-10-CM

## 2023-10-25 DIAGNOSIS — E662 Morbid (severe) obesity with alveolar hypoventilation: Secondary | ICD-10-CM

## 2023-10-25 DIAGNOSIS — G4709 Other insomnia: Secondary | ICD-10-CM

## 2023-10-25 MED ORDER — MELATONIN 10 MG PO TABS: 10.0000 mg | ORAL_TABLET | Freq: Every evening | ORAL | Status: AC

## 2023-10-25 NOTE — Progress Notes (Signed)
 Provider:  Dedra Gores, MD  Primary Care Physician:  de [place], Quintin PARAS, MD [address] [place] [address]     Referring Provider: Tonita Fallow, MD, deceased           Chief Complaint according to patient   Patient presents with:          FSS at 31/ 63 points, ESS at  16/ 24 points, GDS 2/ 15 points.       HISTORY OF PRESENT ILLNESS:  Jason Wyatt is a 68 y.o. male patient who is here for revisit 10/25/2023 for  Hypersomnia, persistent. He uses CPAP. He lost his wife , she passed in November 2023 and he still is grieving. He assured me he uses his I breeze machine every night but I only found 8 days of data recorded.  6-16 cm water . He has a pressure need of 9.5 cm water , residual AHI was 4.4/h.  His main risk factor is obesity and he is also diabetic . On Trulicity ., lost 70 pounds, BMI remains 36   Chief concern according to patient :  My sleep is just not as good since my wife died . Often I am awake for an hour in the early morning. I wake up at 3 AM,  I listen to hymns at night on my TV Roku.   We discussed sleep hygiene, screen light effects on sleep,  we dicussed caffeine in form of teas, he drinks this with dinner- he goes frequently to the bathroom.  I suggested water  intake reduction after 9 PM.  Night lights discussed, warm glow light spectrum preferred and should be illuminating the floor.     Fam Hx : see previous note  Social HX; see previous note       Review of Systems: Out of a complete 14 system review, the patient complains of only the following symptoms, and all other reviewed systems are negative.:   SLEEPINESS ?  How likely are you to doze in the following situations: 0 = not likely, 1 = slight chance, 2 = moderate chance, 3 = high chance  Sitting and Reading? Watching Television? Sitting inactive in a public place (theater or meeting)? Lying down in the afternoon when circumstances permit? Sitting and talking  to someone? Sitting quietly after lunch without alcohol? In a car, while stopped for a few minutes in traffic? As a passenger in a car for an hour without a break?  Total =  31/ 63 points, ESS at  16/ 24 points, GDS 2/ 15 points.        Social History   Socioeconomic History   Marital status: Widowed    Spouse name: Not on file   Number of children: 2   Years of education: Not on file   Highest education level: Associate degree: occupational, Scientist, product/process development, or vocational program  Occupational History   Not on file  Tobacco Use   Smoking status: Never    Passive exposure: Never   Smokeless tobacco: Never  Vaping Use   Vaping status: Never Used  Substance and Sexual Activity   Alcohol use: Never   Drug use: Never   Sexual activity: Not on file  Other Topics Concern   Not on file  Social History Narrative   Lives at home with wife   Caffeine:  1 cup of tea daily    Social Drivers of Health   Financial Resource Strain: Low Risk  (07/13/2023)  Overall Financial Resource Strain (CARDIA)    Difficulty of Paying Living Expenses: Not very hard  Recent Concern: Financial Resource Strain - Medium Risk (04/14/2023)   Overall Financial Resource Strain (CARDIA)    Difficulty of Paying Living Expenses: Somewhat hard  Food Insecurity: No Food Insecurity (07/13/2023)   Hunger Vital Sign    Worried About Running Out of Food in the Last Year: Never true    Ran Out of Food in the Last Year: Never true  Transportation Needs: No Transportation Needs (07/13/2023)   PRAPARE - Administrator, Civil Service (Medical): No    Lack of Transportation (Non-Medical): No  Physical Activity: Insufficiently Active (07/13/2023)   Exercise Vital Sign    Days of Exercise per Week: 7 days    Minutes of Exercise per Session: 10 min  Stress: No Stress Concern Present (07/13/2023)   [employer] of Occupational Health - Occupational Stress Questionnaire    Feeling of Stress: Only a little   Social Connections: Moderately Integrated (07/13/2023)   Social Connection and Isolation Panel    Frequency of Communication with Friends and Family: More than three times a week    Frequency of Social Gatherings with Friends and Family: More than three times a week    Attends Religious Services: 1 to 4 times per year    Active Member of [employer] or Organizations: Yes    Attends Banker Meetings: 1 to 4 times per year    Marital Status: Widowed    Family History  Problem Relation Age of Onset   Hypertension Mother    Dementia Mother    Osteoporosis Mother    Osteoarthritis Mother    Cancer Father    Hyperlipidemia Sister    Hypertension Sister    Stroke Maternal Grandmother    Heart attack Maternal Grandmother    Hypertension Paternal Grandfather    Sleep apnea Son    Sleep apnea Son     Past Medical History:  Diagnosis Date   Acute meniscal tear of left knee    Allergic rhinitis    Arthritis     back   Borderline glaucoma    Chronic back pain    stenosis   CKD (chronic kidney disease), stage II    Complication of anesthesia cervical fusion surgery 06/2010   woke up next day w/ventilator on told due to OSA   GERD (gastroesophageal reflux disease)    takes Prevacid  daily   Gout    takes Allopurinol  daily and Colchicine  if needed   History of adenomatous polyp of colon    tubular adenoma's   History of Barrett's esophagus    History of hiatal hernia    Hyperlipidemia    Hypertension    Incomplete right bundle branch block (RBBB)    Lesion of ulnar nerve, left upper limb 03/27/2018   OA (osteoarthritis)    knee   OSA on CPAP    Peripheral neuropathy    Prostate cancer [facility] [place]) urologist-  dr renda   dx 2013  s/p  radical non-nerve sparing prostatectomy 2014--  Stage pT2cNx,  PSA 3.48,  Gleason 3+3,  vol 55cc/  current PSA  0.01 (Aug 2016)   Rupture of Achilles tendon    Thyroid  nodule    left side   Type II diabetes mellitus (HCC)    Urinary  frequency    takes Flomax  daily   Urine incontinence    Wears glasses     Past Surgical History:  Procedure Laterality Date   ACHILLES TENDON SURGERY Left 06/27/2017   Procedure: ACHILLES TENDON REPAIR;  Surgeon: Gretel Ozell PARAS, DPM;  Location: [facility];  Service: Podiatry;  Laterality: Left;   ANAL FISSURE REPAIR  06-05-2002   BACK SURGERY     CARDIAC CATHETERIZATION     CARDIOVASCULAR STRESS TEST  01-01-2008   normal nuclear study/  no ischemia/  normal LV function and wall motion , 57%   CARPAL TUNNEL RELEASE Left 03/27/2018   CARPAL TUNNEL RELEASE Right 06/2018   ESOPHAGOGASTRODUODENOSCOPY  09/26/2011   Procedure: ESOPHAGOGASTRODUODENOSCOPY (EGD);  Surgeon: Lamar LULLA Bunk, MD;  Location: [facility];  Service: Endoscopy;  Laterality: N/A;   GASTROC RECESSION EXTREMITY Left 06/27/2017   Procedure: GASTROC RECESSION EXTREMITY;  Surgeon: Gretel Ozell PARAS, DPM;  Location: [facility];  Service: Podiatry;  Laterality: Left;   KNEE ARTHROSCOPY WITH MEDIAL MENISECTOMY Left 12/31/2014   Procedure: LEFT KNEE ARTHROSCOPY, PARTIAL MEDIAL AND LATERAL  MENISECTOMIES WITH DEDRIDEMENT;  Surgeon: Reyes Billing, MD;  Location: [facility];  Service: Orthopedics;  Laterality: Left;   LEFT AND RIGHT HEART CATHETERIZATION WITH CORONARY ANGIOGRAM N/A 08/25/2011   Procedure: LEFT AND RIGHT HEART CATHETERIZATION WITH CORONARY ANGIOGRAM;  Surgeon: Peter M Swaziland, MD;  Location: [facility];  Service: Cardiovascular;  Laterality: N/A;   Normal coronary arteries and LVF, ef 55-60%   POSTERIOR FUSION CERVICAL SPINE  07-05-2010   C1 --  C4   POSTERIOR LUMBAR FUSION  05-18-2014   L4 -- S1   ROBOT ASSISTED LAPAROSCOPIC RADICAL PROSTATECTOMY  02/07/2012   Procedure: ROBOTIC ASSISTED LAPAROSCOPIC RADICAL PROSTATECTOMY LEVEL 1;  Surgeon: Toribio Neysa Repine, MD;  Location: [facility];  Service: Urology;  Laterality: N/A;      TENDON TRANSFER Left 06/27/2017   Procedure: TENDON TRANSFER;  Surgeon: Gretel Ozell PARAS, DPM;  Location: [facility];  Service: Podiatry;  Laterality: Left;   TOTAL KNEE ARTHROPLASTY Left 08/05/2015   Procedure: LEFT TOTAL KNEE ARTHROPLASTY;  Surgeon: Reyes Billing, MD;  Location: [facility];  Service: Orthopedics;  Laterality: Left;   TRANSTHORACIC ECHOCARDIOGRAM  08-03-2011   mild LVH, ef 55-60%/  trivial TR and PR   UVULOPALATOPHARYNGOPLASTY (UPPP)/TONSILLECTOMY/SEPTOPLASTY  1990's   and Adenoidectomy   wisdom teeth extracted   1982     Current Outpatient Medications on File Prior to Visit  Medication Sig Dispense Refill   Accu-Chek Softclix Lancets lancets USE 1 LANCET IN THE MORNING , AT NOON AND AT BEDTIME 100 each 9   acyclovir  (ZOVIRAX ) 400 MG tablet TAKE 1 TABLET BY MOUTH DAILY (Patient taking differently: Take 400 mg by mouth daily. As needed) 90 tablet 0   allopurinol  (ZYLOPRIM ) 300 MG tablet TAKE 1 TABLET BY MOUTH  DAILY FOR GOUT PREVENTION 90 tablet 3   aspirin  81 MG tablet Take 81 mg by mouth daily.     atorvastatin  (LIPITOR) 20 MG tablet Take 1 tablet (20 mg total) by mouth daily. 90 tablet 3   Blood Glucose Monitoring Suppl (ACCU-CHEK GUIDE) w/Device KIT USE TO TEST IN THE MORNING , AT  NOON AND AT BEDTIME 1 kit 0   Blood Glucose Monitoring Suppl DEVI 1 each by Does not apply route in the morning, at noon, and at bedtime. May substitute to any manufacturer covered by patient's insurance. 1 each 0   carbidopa -levodopa  (SINEMET  IR) 25-250 MG tablet TAKE 1 TABLET BY MOUTH  TWICE DAILY FOR RESTLESS  LEGS 180 tablet 3   Cyanocobalamin (B-12) 2500 MCG SUBL Place under the  tongue every other day. 4 times a week     dextromethorphan-guaiFENesin  (MUCINEX  DM) 30-600 MG 12hr tablet Take 1 tablet by mouth 2 (two) times daily as needed for cough.     DULoxetine  (CYMBALTA ) 60 MG capsule TAKE 1 CAPSULE BY MOUTH AT  BEDTIME 90 capsule 3   ferrous sulfate 325 (65 FE) MG tablet Take 325 mg by mouth daily with breakfast.     fexofenadine (ALLEGRA) 180 MG tablet Take 180 mg by mouth  daily as needed.     fluticasone  (FLONASE ) 50 MCG/ACT nasal spray USE 2 SPRAYS IN BOTH NOSTRILS  ONCE DAILY 48 g 2   gabapentin  (NEURONTIN ) 600 MG tablet Take 1 tablet (600 mg total) by mouth 2 (two) times daily. 180 tablet 3   Magnesium  400 MG CAPS Take 800 mg by mouth at bedtime.     meloxicam  (MOBIC ) 7.5 MG tablet Take 1 tablet Daily  with Food  is needed for Pain 20 tablet 2   metFORMIN  (GLUCOPHAGE -XR) 500 MG 24 hr tablet TAKE 2 TABLETS BY MOUTH  TWICE DAILY WITH MEALS FOR  DIABETES 360 tablet 3   methocarbamol  (ROBAXIN ) 500 MG tablet Take 500 mg by mouth 3 (three) times daily as needed. PRN     Multiple Vitamin (MULTIVITAMIN) tablet Take 1 tablet by mouth daily.     olmesartan  (BENICAR ) 40 MG tablet TAKE 1 TABLET BY MOUTH AT NIGHT  FOR BLOOD PRESSURE AND DIABETIC  KIDNEY PROTECTION 90 tablet 3   Omega 3 1200 MG CAPS Take 1 capsule by mouth daily.     pantoprazole  (PROTONIX ) 40 MG tablet TAKE 1 TABLET DAILY TO  PREVENT HEARTBURN &amp;  INDIGESTION 90 tablet 3   propranolol  (INDERAL ) 40 MG tablet TAKE 1 TABLET BY MOUTH DAILY 30 tablet 11   tamsulosin  (FLOMAX ) 0.4 MG CAPS capsule TAKE 2 CAPSULES BY MOUTH AT  BEDTIME FOR PROSTATE 180 capsule 3   TRULICITY  4.5 MG/0.5ML SOPN Inject 1 pen  (4.5 mg) into Skin every 7 days 6 mL 3   Vitamin D , Ergocalciferol , (DRISDOL ) 1.25 MG (50000 UNIT) CAPS capsule TAKE 1 CAPSULE BY MOUTH EVERY 7  DAYS 12 capsule 3   glucose blood (ONETOUCH ULTRA) test strip USE TO CHECK BLOOD SUGAR  DAILY (Patient not taking: Reported on 10/25/2023) 100 strip 3   mupirocin  ointment (BACTROBAN ) 2 % Apply 1 Application topically 2 (two) times daily. (Patient not taking: Reported on 10/25/2023) 30 g 2   Naphazoline-Pheniramine (EYE ALLERGY RELIEF OP) Apply to eye. (Patient not taking: Reported on 10/25/2023)     No current facility-administered medications on file prior to visit.    Allergies  Allergen Reactions   Fenofibrate  Itching and Rash   Flaxseed Oil Rash   Flaxseed  [Flaxseed (Linseed)] Rash   Lyrica [Pregabalin] Itching and Rash   Niaspan [Niacin Er (Antihyperlipidemic)] Rash     DIAGNOSTIC DATA (LABS, IMAGING, TESTING) - I reviewed patient records, labs, notes, testing and imaging myself where available.  Lab Results  Component Value Date   WBC 6.5 07/09/2023   HGB 13.8 07/09/2023   HCT 40.8 07/09/2023   MCV 98 (H) 07/09/2023   PLT 225 07/09/2023      Component Value Date/Time   NA 141 07/09/2023 1012   K 4.7 07/09/2023 1012   CL 101 07/09/2023 1012   CO2 23 07/09/2023 1012   GLUCOSE 114 (H) 07/09/2023 1012   GLUCOSE 121 (H) 01/11/2023 0942   BUN 11 07/09/2023 1012   CREATININE 0.92 07/09/2023 1012  CREATININE 0.93 01/11/2023 0942   CALCIUM  10.0 07/09/2023 1012   PROT 6.4 07/09/2023 1012   ALBUMIN 4.3 07/09/2023 1012   AST 14 07/09/2023 1012   ALT 13 07/09/2023 1012   ALKPHOS 84 07/09/2023 1012   BILITOT 0.5 07/09/2023 1012   GFRNONAA >60 08/16/2020 1825   GFRNONAA 80 07/22/2020 1008   GFRAA 93 07/22/2020 1008   Lab Results  Component Value Date   CHOL 98 (L) 07/09/2023   HDL 35 (L) 07/09/2023   LDLCALC 36 07/09/2023   TRIG 160 (H) 07/09/2023   CHOLHDL 2.8 07/09/2023   Lab Results  Component Value Date   HGBA1C 6.3 (H) 07/09/2023   Lab Results  Component Value Date   VITAMINB12 >2,000 (H) 07/06/2021   Lab Results  Component Value Date   TSH 2.380 07/09/2023    PHYSICAL EXAM:  Vitals:   10/25/23 0932  BP: (!) 153/91  Pulse: 69   No data found. Body mass index is 35.64 kg/m.   Wt Readings from Last 3 Encounters:  10/25/23 277 lb 9.6 oz (125.9 kg)  09/19/23 277 lb 3.2 oz (125.7 kg)  07/17/23 277 lb 3.2 oz (125.7 kg)     Ht Readings from Last 3 Encounters:  10/25/23 6' 2 (1.88 m)  09/19/23 6' 2 (1.88 m)  07/17/23 6' 2 (1.88 m)      General: The patient is awake, alert and appears not in acute distress and groomed. Head: Normocephalic, atraumatic.  Neck is supple.Neck is supple. Mallampati  STATUS  POST UPPP., scalloped  Tongue.  neck circumference:22 inches . Nasal airflow is  patent.   Retrognathia is  seen.  Dental status:  Cardiovascular:  Regular rate and cardiac rhythm by pulse,  without distended neck veins. Respiratory: Lungs are clear to auscultation.  Skin:  Without evidence of ankle edema, or rash. Trunk: The patient's posture is erect.   Neurologic exam : The patient is awake and alert, oriented to place and time.   Memory subjective described as intact.  Attention span & concentration ability appears normal.  Speech is fluent,  without  dysarthria, dysphonia but some word finding delays-aphasia.  Mood and affect are appropriate.   Cranial nerves: no loss of smell or taste reported  Pupils are equal and briskly reactive to light.  Funduscopic exam ; deferred  Status post cataract .  Extraocular movements in vertical and horizontal planes were intact and without nystagmus. No Diplopia. Visual fields by finger perimetry are intact. Hearing was impaired  to soft voice and finger rubbing.    Facial sensation intact to fine touch.  Facial motor strength is symmetric and tongue was midline.  Neck ROM : rotation, tilt and flexion extension were normal for age and shoulder shrug was symmetrical.    Motor exam:  Symmetric bulk, tone and ROM.  There is loss of foot dorsiflexion and plantarflexion.   Normal tone without cog wheeling, symmetric grip strength - weak ness of grip ! He has a pronator drift on the right. .   Sensory:  Fine touch, pinprick and vibration were absent in both legs below knee, Proprioception tested in the upper extremities was normal. Numbness has ascended to the hands, all 10 fingers. He has had carpal tunnel ( status post repair ) on the right.    ASSESSMENT AND PLAN :   68 y.o. year old male  here with:    1) Obesity, OSA on CPAP- doing well with apnea - hypopnea control.  Device  only  stored some of the data of the last 30 days. CPAP I  breeze set up in 2022. CPAP dependent - has been for over 15 years, has had a [employer]ips machine on recall- prescribed in 2009?  Based on a presumed diagnosis of OHV and OSA, hypoventilation. Confirmed severe OSA by HST and now on auto -CPAP ResVent machine .large size N 20 . 2)  Morbid obesity, large neck, UPPP has not let to open airway. Macroglossia, retrognathia.  ( He is not a candidate for inspire due to BMI, Truncal obesity, hypoventilation, hypoxia)   2) diabetic neuropathy, painful, affecting gait stability.   3) Insomnia with grief- sleep hygiene. Keeping the bedroom, dark, cool, and quiet,  we discussed screen light s, drinking tea with supper and fluid intake late at night, needing to change.     I would like to thank de [place], Quintin PARAS, MD and Tonita Fallow, Md No address on file for allowing me to meet with this pleasant patient.   Sleep Clinic Patients are generally offered input on sleep hygiene, life style changes and how to improve compliance with medical treatment where applicable. Review and reiteration of good sleep hygiene measures is offered to any sleep clinic patient, be it in the first consultation or with any follow up visits.    Any patient with sleepiness should be cautioned not to drive, work at heights, or operate dangerous or heavy equipment when feeling tired or sleepy.   The patient will be seen in follow-up in the sleep clinic at [facility] for discussion of test results, sleep related symptoms and treatment compliance review, further management strategies, etc.   The referring provider will be notified of the test results.   The patient's condition requires frequent monitoring and adjustments in the treatment plan, reflecting the ongoing complexity of care.  This provider is the continuing focal point for all needed services for this condition.  After spending a total time of  40  minutes face to face and time for  history taking, physical and neurologic  examination, review of laboratory studies,  personal review of imaging studies, reports and results of other testing and review of referral information / records as far as provided in visit,   Electronically signed by: Dedra Gores, MD 10/25/2023 9:54 AM  [facility] Board certified by The [employer] of Sleep Medicine and Diplomate of the [employer] of Sleep Medicine. Board certified In Neurology through the ABPN, Fellow of the [employer] of Neurology.

## 2023-10-25 NOTE — Patient Instructions (Addendum)
 Quality Sleep Information, Adult Quality sleep is important for your mental and physical health. It also improves your quality of life. Quality sleep means you: Are asleep for most of the time you are in bed. Fall asleep within 30 minutes. Wake up no more than once a night. Are awake for no longer than 20 minutes if you do wake up during the night. Most adults need 7-8 hours of quality sleep each night. How can poor sleep affect me? If you do not get enough quality sleep, you may have: Mood swings. Daytime sleepiness. Decreased alertness, reaction time, and concentration. Sleep disorders, such as insomnia and sleep apnea. Difficulty with: Solving problems. Coping with stress. Paying attention. These issues may affect your performance and productivity at work, school, and home. Lack of sleep may also put you at higher risk for accidents, suicide, and risky behaviors. If you do not get quality sleep, you may also be at higher risk for several health problems, including: Infections. Type 2 diabetes. Heart disease. High blood pressure. Obesity. Worsening of long-term conditions, like arthritis, kidney disease, depression, Parkinson's disease, and epilepsy. What actions can I take to get more quality sleep? Sleep schedule and routine Stick to a sleep schedule. Go to sleep and wake up at about the same time each day. Do not try to sleep less on weekdays and make up for lost sleep on weekends. This does not work. Limit naps during the day to 30 minutes or less. Do not take naps in the late afternoon. Make time to relax before bed. Reading, listening to music, or taking a hot bath promotes quality sleep. Make your bedroom a place that promotes quality sleep. Keep your bedroom dark, quiet, and at a comfortable room temperature. Make sure your bed is comfortable. Avoid using electronic devices that give off bright blue light for 30 minutes before bedtime. Your brain perceives bright blue light  as sunlight. This includes television, phones, and computers. If you are lying awake in bed for longer than 20 minutes, get up and do a relaxing activity until you feel sleepy. Lifestyle     Try to get at least 30 minutes of exercise on most days. Do not exercise 2-3 hours before going to bed. Do not use any products that contain nicotine or tobacco. These products include cigarettes, chewing tobacco, and vaping devices, such as e-cigarettes. If you need help quitting, ask your health care provider. Do not drink caffeinated beverages for at least 8 hours before going to bed. Coffee, tea, and some sodas contain caffeine. Do not drink alcohol or eat large meals close to bedtime. Try to get at least 30 minutes of sunlight every day. Morning sunlight is best. Medical concerns Work with your health care provider to treat medical conditions that may affect sleeping, such as: Nasal obstruction. Snoring. Sleep apnea and other sleep disorders. Talk to your health care provider if you think any of your prescription medicines may cause you to have difficulty falling or staying asleep. If you have sleep problems, talk with a sleep consultant. If you think you have a sleep disorder, talk with your health care provider about getting evaluated by a specialist. Where to find more information Sleep Foundation: sleepfoundation.org American Academy of Sleep Medicine: aasm.org Centers for Disease Control and Prevention (CDC): TonerPromos.no Contact a health care provider if: You have trouble getting to sleep or staying asleep. You often wake up very early in the morning and cannot get back to sleep. You have daytime sleepiness. You  have daytime sleep attacks of suddenly falling asleep and sudden muscle weakness (narcolepsy). You have a tingling sensation in your legs with a strong urge to move your legs (restless legs syndrome). You stop breathing briefly during sleep (sleep apnea). You think you have a sleep  disorder or are taking a medicine that is affecting your quality of sleep. Summary Most adults need 7-8 hours of quality sleep each night. Getting enough quality sleep is important for your mental and physical health. Make your bedroom a place that promotes quality sleep, and avoid things that may cause you to have poor sleep, such as alcohol, caffeine, smoking, or large meals. Talk to your health care provider if you have trouble falling asleep or staying asleep. This information is not intended to replace advice given to you by your health care provider. Make sure you discuss any questions you have with your health care provider. Document Revised: 04/27/2021 Document Reviewed: 04/27/2021 Elsevier Patient Education  2024 [employer]  Insomnia Insomnia is a sleep disorder that makes it difficult to fall asleep or stay asleep. Insomnia can cause fatigue, low energy, difficulty concentrating, mood swings, and poor performance at work or school. There are three different ways to classify insomnia: Difficulty falling asleep. Difficulty staying asleep. Waking up too early in the morning. Any type of insomnia can be long-term (chronic) or short-term (acute). Both are common. Short-term insomnia usually lasts for 3 months or less. Chronic insomnia occurs at least three times a week for longer than 3 months. What are the causes? Insomnia may be caused by another condition, situation, or substance, such as: Having certain mental health conditions, such as anxiety and depression. Using caffeine, alcohol, tobacco, or drugs. Having gastrointestinal conditions, such as gastroesophageal reflux disease (GERD). Having certain medical conditions. These include: Asthma. Alzheimer's disease. Stroke. Chronic pain. An overactive thyroid  gland (hyperthyroidism). Other sleep disorders, such as restless legs syndrome and sleep apnea. Menopause. Sometimes, the cause of insomnia may not be known. What  increases the risk? Risk factors for insomnia include: Gender. Females are affected more often than males. Age. Insomnia is more common as people get older. Stress and certain medical and mental health conditions. Lack of exercise. Having an irregular work schedule. This may include working night shifts and traveling between different time zones. What are the signs or symptoms? If you have insomnia, the main symptom is having trouble falling asleep or having trouble staying asleep. This may lead to other symptoms, such as: Feeling tired or having low energy. Feeling nervous about going to sleep. Not feeling rested in the morning. Having trouble concentrating. Feeling irritable, anxious, or depressed. How is this diagnosed? This condition may be diagnosed based on: Your symptoms and medical history. Your health care provider may ask about: Your sleep habits. Any medical conditions you have. Your mental health. A physical exam. How is this treated? Treatment for insomnia depends on the cause. Treatment may focus on treating an underlying condition that is causing the insomnia. Treatment may also include: Medicines to help you sleep. Counseling or therapy. Lifestyle adjustments to help you sleep better. Follow these instructions at home: Eating and drinking  Limit or avoid alcohol, caffeinated beverages, and products that contain nicotine and tobacco, especially close to bedtime. These can disrupt your sleep. Do not eat a large meal or eat spicy foods right before bedtime. This can lead to digestive discomfort that can make it hard for you to sleep. Sleep habits  Keep a sleep diary to help you  and your health care provider figure out what could be causing your insomnia. Write down: When you sleep. When you wake up during the night. How well you sleep and how rested you feel the next day. Any side effects of medicines you are taking. What you eat and drink. Make your bedroom a  dark, comfortable place where it is easy to fall asleep. Put up shades or blackout curtains to block light from outside. Use a white noise machine to block noise. Keep the temperature cool. Limit screen use before bedtime. This includes: Not watching TV. Not using your smartphone, tablet, or computer. Stick to a routine that includes going to bed and waking up at the same times every day and night. This can help you fall asleep faster. Consider making a quiet activity, such as reading, part of your nighttime routine. Try to avoid taking naps during the day so that you sleep better at night. Get out of bed if you are still awake after 15 minutes of trying to sleep. Keep the lights down, but try reading or doing a quiet activity. When you feel sleepy, go back to bed. General instructions Take over-the-counter and prescription medicines only as told by your health care provider. Exercise regularly as told by your health care provider. However, avoid exercising in the hours right before bedtime. Use relaxation techniques to manage stress. Ask your health care provider to suggest some techniques that may work well for you. These may include: Breathing exercises. Routines to release muscle tension. Visualizing peaceful scenes. Make sure that you drive carefully. Do not drive if you feel very sleepy. Keep all follow-up visits. This is important. Contact a health care provider if: You are tired throughout the day. You have trouble in your daily routine due to sleepiness. You continue to have sleep problems, or your sleep problems get worse. Get help right away if: You have thoughts about hurting yourself or someone else. Get help right away if you feel like you may hurt yourself or others, or have thoughts about taking your own life. Go to your nearest emergency room or: Call 911. Call the National Suicide Prevention Lifeline at 403 419 6302 or 988. This is open 24 hours a day. Text the Crisis  Text Line at 774-369-4851. Summary Insomnia is a sleep disorder that makes it difficult to fall asleep or stay asleep. Insomnia can be long-term (chronic) or short-term (acute). Treatment for insomnia depends on the cause. Treatment may focus on treating an underlying condition that is causing the insomnia. Keep a sleep diary to help you and your health care provider figure out what could be causing your insomnia. This information is not intended to replace advice given to you by your health care provider. Make sure you discuss any questions you have with your health care provider. Document Revised: 12/13/2020 Document Reviewed: 12/13/2020 Elsevier Patient Education  2024 [employer].  Prolonged Grief Grief is a normal response to the death of someone close to you. Feelings of fear, anger, and guilt can affect almost everyone who loses a loved one. It is also common to have symptoms of depression while you are grieving. These include problems with sleep, loss of appetite, and lack of energy. They may last for weeks or months after a loss. Prolonged grief is different from normal grief or depression. Normal grieving involves sadness and feelings of loss, but those feelings get better and heal over time. Prolonged grief is a severe type of grief that lasts for a long time,  usually for several months to a year or longer. It interferes with your ability to function normally. Prolonged grief may require treatment from a mental health care provider. What are the causes? The cause of this condition is not known. It is not clear why some people continue to struggle with grief and others do not. What increases the risk? You are more likely to develop this condition if: The death of your loved one was sudden or unexpected. The death of your loved one was due to a violent event. Your loved one died from suicide. Your loved one was a child or a young person. You were very close to your loved one, or you were  dependent on him or her. You have a history of depression or anxiety. You have very little to no support from others. What are the signs or symptoms? Symptoms of this condition include: Feeling disbelief or having a lack of emotion (numbness). Being unable to enjoy good memories of your loved one. Needing to avoid anything or anyone that reminds you of your loved one. Being unable to stop thinking about the death. Feeling intense anger, loneliness, helplessness, or guilt. Feeling that your life is meaningless and empty, and having trouble moving on with your life. How is this diagnosed? This condition may be diagnosed based on: Your symptoms. Prolonged grief will be diagnosed if you have ongoing symptoms of grief for 6 months for children and 12 months or longer for adults. The effect of symptoms on your life. You may be diagnosed with this condition if your symptoms are interfering with your ability to live your life. Your health care provider may recommend that you see a mental health care provider. Many symptoms of depression are similar to the symptoms of prolonged grief. It is important to be evaluated for prolonged grief along with other mental health conditions. How is this treated? This condition is most commonly treated with talk therapy. This therapy is offered by a mental health specialist (psychiatrist). During therapy: You will learn healthy ways to cope with the loss of your loved one. Your mental health care provider may recommend antidepressant medicines. Follow these instructions at home: Lifestyle  Take care of yourself. Eat on a regular basis, and maintain a healthy diet. Eat plenty of fruits, vegetables, lean protein, and whole grains. Try to get some exercise each day. Aim for 30 minutes of exercise on most days of the week. Keep a consistent sleep schedule. Try to get 8 or more hours of sleep each night. Start doing the things that you used to enjoy. Do not use drugs  or alcohol to ease your symptoms. Spend time with friends and loved ones. General instructions Take over-the-counter and prescription medicines only as told by your health care provider. Consider joining a grief (bereavement) support group to help you deal with your loss. Keep all follow-up visits. This is important. Contact a health care provider if: Your symptoms prevent you from functioning normally. Your symptoms do not get better with treatment. Get help right away if: You have serious thoughts about hurting yourself or someone else. You have suicidal feelings. Get help right away if you feel like you may hurt yourself or others, or have thoughts about taking your own life. Go to your nearest emergency room or: Call 911. Call the National Suicide Prevention Lifeline at 864-736-4834 or 988. This is open 24 hours a day. Text the Crisis Text Line at 707-070-7908. Summary Prolonged grief is a severe type of grief  that lasts for a long time. This grief is not likely to go away on its own. Get the help you need. Some griefs are more difficult than others and can cause this condition. You may need a certain type of treatment to help you recover if the loss of your loved one was sudden, violent, or due to suicide. You may feel guilty about moving on with your life. Getting help does not mean that you are forgetting your loved one. It means that you are taking care of yourself. Prolonged grief is best treated with talk therapy. Medicines may also be prescribed. Seek the help you need, and find support that will help you recover. This information is not intended to replace advice given to you by your health care provider. Make sure you discuss any questions you have with your health care provider. Document Revised: 08/23/2020 Document Reviewed: 08/23/2020 Elsevier Patient Education  2024 Elsevier Inc.  30- year- old male  here with:     1) Obesity, OSA on CPAP- doing well with apnea - hypopnea  control.  Device  only stored some of the data of the last 30 days. CPAP I breeze set up in 2022. CPAP dependent - has been for over 15 years, has had a [employer]ips machine on recall- prescribed in 2009?  Based on a presumed diagnosis of OHV and OSA, hypoventilation. Confirmed severe OSA by HST and now on auto -CPAP ResVent machine .large size N 20 . 2)  Morbid obesity, large neck, UPPP has not let to open airway. Macroglossia, retrognathia.  ( He is not a candidate for inspire due to BMI, Truncal obesity, hypoventilation, hypoxia)    2) diabetic neuropathy, painful, affecting gait stability.    3) Insomnia with grief- sleep hygiene. Keeping the bedroom, dark, cool, and quiet,  we discussed screen light s, drinking tea with supper and fluid intake late at night, needing to change.        I would like to thank [place], Quintin PARAS, MD and Tonita Fallow, Md No address on file for allowing me to meet with this pleasant patient.    Sleep Clinic Patients are generally offered input on sleep hygiene, life style changes and how to improve compliance with medical treatment where applicable. Review and reiteration of good sleep hygiene measures is offered to any sleep clinic patient, be it in the first consultation or with any follow up visits.     Any patient with sleepiness should be cautioned not to drive, work at heights, or operate dangerous or heavy equipment when feeling tired or sleepy.   The patient will be seen in follow-up in the sleep clinic at [facility] for discussion of test results, sleep related symptoms and treatment compliance review, further management strategies, etc.

## 2023-10-29 ENCOUNTER — Other Ambulatory Visit: Payer: Self-pay | Admitting: Nurse Practitioner

## 2023-10-29 DIAGNOSIS — G8929 Other chronic pain: Secondary | ICD-10-CM

## 2023-10-30 ENCOUNTER — Telehealth (HOSPITAL_BASED_OUTPATIENT_CLINIC_OR_DEPARTMENT_OTHER): Payer: Self-pay | Admitting: *Deleted

## 2023-10-30 ENCOUNTER — Ambulatory Visit: Payer: Self-pay | Admitting: Cardiology

## 2023-10-30 NOTE — Telephone Encounter (Signed)
 Patient was not saw 09/27/23 faxed over last office visit notes which ws July to patient

## 2023-10-30 NOTE — Telephone Encounter (Signed)
 Copied from CRM [id number]. Topic: General - Other >> Oct 03, 2023 10:43 AM Tiffini S wrote: Reason for CRM: Arlyne with [employer] called about a referral for diabetic shoes and inserts - need encounter notes for 09/27/23- fax number [phone]  Patient have appointment today at 1:00pm- if any questions please call [phone]

## 2023-11-14 ENCOUNTER — Other Ambulatory Visit: Payer: Self-pay | Admitting: Nurse Practitioner

## 2023-11-14 DIAGNOSIS — G8929 Other chronic pain: Secondary | ICD-10-CM

## 2023-11-15 NOTE — Progress Notes (Unsigned)
 Cardiology Office Note    Date:  11/16/2023  ID:  Elion, Hocker 1955-11-03, MRN [id number] PCP:  de [place], Raymond J, MD  Cardiologist:  Dorn Lesches, MD  Electrophysiologist:  None   Chief Complaint: Follow up for dyspnea on exertion   History of Present Illness: .    Sevyn MELINDA POTTINGER is a 69 y.o. male with visit-pertinent history of PMH of hypertension, peripheral vascular disease, OSA on CPAP, type 2 diabetes, hyperlipidemia, RLS, gait instability, insomnia, shortness of breath, and obesity.  He underwent cardiac catheterization in 2013 which was normal.  His echocardiogram in 2020 showed mild LVH and diastolic dysfunction.  He is followed by Dr. Lesches.  He was seen by Dr. Jordan on 09/09/2021.  During that time he was being evaluated for shortness of breath.  He reported dyspnea and chest tightness with exertion.  His symptoms had been ongoing for more than a year.  He reported having COVID the prior year.  He reported that his symptoms had started before he contracted COVID.  He was unable to do housework without stopping to rest.  He had no lower extremity swelling.  He had lost weight.  He reported rare palpitations.  He was using his CPAP regularly.  He was having increased stress with his wife being on hospice.  A echocardiogram repeated at that time showed an LVEF of 60-65%, G1 DD, mild dilation of aortic root measuring 39 mm and ascending aortic dilation measuring 41 mm.  He had coronary CTA on 09/29/2021.  He was noted to have a coronary calcium  score of 55 which placed him in 38 percentile for age and sex matched control.  He was noted to have mid stenosis of his LAD 30-49% and medical management was recommended.  Patient was seen in clinic on 09/19/2022.  Patient reported that he was doing well at that time.  He denied any cardiac concerns or complaints.  Patient was last in clinic on 09/19/23, noted he was doing well overall. He did report some increased dyspnea on exertion,  questioned if it had worsened in recent months. He denied chest pain, tightness of pressure. Echocardiogram was ordered for further evaluation.  Echocardiogram on 10/23/2023 indicated LVEF 60 to 65%, no RWMA, severe asymmetric LVH of the basal septal segment, G1 DD, RV systolic function and size was normal, normal PASP, no evidence of mitral valve digitation or stenosis, mild calcification of the aortic valve, no aortic valve stenosis present, mild dilation of the aortic root measuring 41 mm.  Today he presents for follow-up.  He reports that he has been doing well overall, he denies chest pain notes some shoulder pain related to spine history with multiple interventions.  He continues to note some increased shortness of breath with prolonged exertion, denies any increased lower extremity edema, orthopnea or PND.  He denies any increased palpitations, presyncope or syncope.  He reports compliance with all medications, reports that his blood pressure is typically very well-controlled at home, consistently less than 130/80.  He also reports that he uses his CPAP nightly and tolerates well. ROS: .   Today he denies chest pain, lower extremity edema, fatigue, palpitations, melena, hematuria, hemoptysis, diaphoresis, weakness, presyncope, syncope, orthopnea, and PND.  All other systems are reviewed and otherwise negative. Studies Reviewed: SABRA   EKG:  EKG is not ordered today.  CV Studies: Cardiac studies reviewed are outlined and summarized above. Otherwise please see EMR for full report. Cardiac Studies & Procedures   ______________________________________________________________________________________________  ECHOCARDIOGRAM  ECHOCARDIOGRAM COMPLETE 10/23/2023  Narrative ECHOCARDIOGRAM REPORT    Patient Name:   KERMITT HARJO Date of Exam: 10/23/2023 Medical Rec #:  [id number]      Height:       74.0 in Accession #:    [id number]     Weight:       277.2 lb Date of Birth:  09/24/1955       BSA:           2.497 m Patient Age:    68 years       BP:           171/106 mmHg Patient Gender: M              HR:           65 bpm. Exam Location:  [address]  Procedure: 2D Echo, Cardiac Doppler, Color Doppler and Intracardiac Opacification Agent (Both Spectral and Color Flow Doppler were utilized during procedure).  Indications:    R06.09 SOB  History:        Patient has prior history of Echocardiogram examinations, most recent 09/21/2021. PVD; Risk Factors:Hypertension, Diabetes, Sleep Apnea and HLD.  Sonographer:    Waldo Guadalajara RCS Referring Phys: [id number] Imajean Mcdermid D Lesley Atkin  IMPRESSIONS   1. Left ventricular ejection fraction, by estimation, is 60 to 65%. The left ventricle has normal function. The left ventricle has no regional wall motion abnormalities. There is severe asymmetric left ventricular hypertrophy of the basal-septal segment. Left ventricular diastolic parameters are consistent with Grade I diastolic dysfunction (impaired relaxation). 2. Right ventricular systolic function is normal. The right ventricular size is normal. There is normal pulmonary artery systolic pressure. 3. The mitral valve is normal in structure. No evidence of mitral valve regurgitation. No evidence of mitral stenosis. 4. The aortic valve is tricuspid. There is mild calcification of the aortic valve. Aortic valve regurgitation is not visualized. No aortic stenosis is present. 5. Aortic dilatation noted. There is mild dilatation of the aortic root, measuring 41 mm.  Comparison(s): No significant change from prior study. Prior images reviewed side by side.  FINDINGS Left Ventricle: Sigmoing septum 17 mm with no LVOT flow accleration or SAM or the mitral valve. Left ventricular ejection fraction, by estimation, is 60 to 65%. The left ventricle has normal function. The left ventricle has no regional wall motion abnormalities. The left ventricular internal cavity size was normal in size. There is severe  asymmetric left ventricular hypertrophy of the basal-septal segment. Left ventricular diastolic parameters are consistent with Grade I diastolic dysfunction (impaired relaxation). Normal left ventricular filling pressure.  Right Ventricle: The right ventricular size is normal. No increase in right ventricular wall thickness. Right ventricular systolic function is normal. There is normal pulmonary artery systolic pressure. The tricuspid regurgitant velocity is 2.31 m/s, and with an assumed right atrial pressure of 3 mmHg, the estimated right ventricular systolic pressure is 24.3 mmHg.  Left Atrium: Left atrial size was normal in size.  Right Atrium: Right atrial size was normal in size.  Pericardium: Trivial pericardial effusion is present. The pericardial effusion is circumferential.  Mitral Valve: The mitral valve is normal in structure. No evidence of mitral valve regurgitation. No evidence of mitral valve stenosis.  Tricuspid Valve: The tricuspid valve is normal in structure. Tricuspid valve regurgitation is mild . No evidence of tricuspid stenosis.  Aortic Valve: The aortic valve is tricuspid. There is mild calcification of the aortic valve. Aortic valve regurgitation is not visualized. No aortic  stenosis is present.  Pulmonic Valve: The pulmonic valve was normal in structure. Pulmonic valve regurgitation is not visualized. No evidence of pulmonic stenosis.  Aorta: Aortic dilatation noted. There is mild dilatation of the aortic root, measuring 41 mm.  IAS/Shunts: The atrial septum is grossly normal.   LEFT VENTRICLE PLAX 2D LVIDd:         5.20 cm   Diastology LVIDs:         3.20 cm   LV e' medial:    6.09 cm/s LV PW:         1.00 cm   LV E/e' medial:  9.9 LV IVS:        1.10 cm   LV e' lateral:   6.20 cm/s LVOT diam:     2.40 cm   LV E/e' lateral: 9.7 LV SV:         86 LV SV Index:   35 LVOT Area:     4.52 cm LV IVRT:       109 msec   RIGHT VENTRICLE RV Basal diam:  3.00  cm RV S prime:     11.30 cm/s TAPSE (M-mode): 1.8 cm RVSP:           24.3 mmHg  LEFT ATRIUM             Index        RIGHT ATRIUM           Index LA diam:        3.80 cm 1.52 cm/m   RA Pressure: 3.00 mmHg LA Vol (A2C):   52.0 ml 20.83 ml/m  RA Area:     11.90 cm LA Vol (A4C):   48.0 ml 19.22 ml/m  RA Volume:   23.40 ml  9.37 ml/m LA Biplane Vol: 52.9 ml 21.19 ml/m AORTIC VALVE LVOT Vmax:   99.80 cm/s LVOT Vmean:  62.500 cm/s LVOT VTI:    0.191 m  AORTA Ao Root diam: 4.10 cm Ao Asc diam:  3.50 cm  MITRAL VALVE               TRICUSPID VALVE MV Area (PHT):             TR Peak grad:   21.3 mmHg MV Decel Time:             TR Vmax:        231.00 cm/s MV E velocity: 60.30 cm/s  Estimated RAP:  3.00 mmHg MV A velocity: 71.30 cm/s  RVSP:           24.3 mmHg MV E/A ratio:  0.85 SHUNTS Systemic VTI:  0.19 m Systemic Diam: 2.40 cm  Stanly Leavens MD Electronically signed by Stanly Leavens MD Signature Date/Time: 10/23/2023/12:12:54 PM    Final      CT SCANS  CT CORONARY MORPH W/CTA COR W/SCORE 09/29/2021  Addendum 09/29/2021  1:50 PM ADDENDUM REPORT: 09/29/2021 13:48  EXAM: OVER-READ INTERPRETATION  CT CHEST  The following report is an over-read performed by radiologist Dr. Lynwood Seip [facility] on 09/29/2021. This over-read does not include interpretation of cardiac or coronary anatomy or pathology. The coronary CTA interpretation by the cardiologist is attached.  COMPARISON:  August 03, 2011.  FINDINGS: 4 cm ascending thoracic aortic aneurysm is noted. No definite abnormality is seen involving the visualized mediastinum. No definite abnormality seen involving the visualized portion of the upper abdomen. Visualized pulmonary parenchyma is unremarkable. Visualized skeleton is unremarkable.  IMPRESSION: 4 cm ascending  thoracic aortic aneurysm. Recommend annual imaging followup by CTA or MRA. This recommendation follows  2010 ACCF/AHA/AATS/ACR/ASA/SCA/SCAI/SIR/STS/SVM Guidelines for the Diagnosis and Management of Patients with Thoracic Aortic Disease. Circulation. 2010; 121: Z733-z630. Aortic aneurysm NOS (ICD10-I71.9). These results will be called to the ordering clinician or representative by the Radiologist Assistant, and communication documented in the PACS or zVision Dashboard.   Electronically Signed By: Lynwood Landy Raddle M.D. On: 09/29/2021 13:48  Narrative CLINICAL DATA:  69 year old with chest pain.  EXAM: Cardiac/Coronary  CTA  TECHNIQUE: The patient was scanned on a [employer].  FINDINGS: A 120 kV prospective scan was triggered in the descending thoracic aorta at 111 HU's. Axial non-contrast 3 mm slices were carried out through the heart. The data set was analyzed on a dedicated work station and scored using the Agatson method. Gantry rotation speed was 250 msecs and collimation was .6 mm. 0.8 mg of sl NTG was given. The 3D data set was reconstructed in 5% intervals of the 67-82 % of the R-R cycle. Diastolic phases were analyzed on a dedicated work station using MPR, MIP and VRT modes. The patient received 80 cc of contrast.  Image quality: Good, mild misregistration artifact. Distal vessels are small in caliber and difficult to visualize.  Aorta:  Normal size.  No calcifications.  No dissection.  Aortic Valve: No calcifications.  Coronary Arteries:  Normal coronary origin.  Right dominance.  RCA is a large dominant artery that gives rise to PDA and PLA. There is no plaque.  Left main is a large artery that gives rise to LAD and Ramus/LCX arteries.  LAD is a large vessel that has focal mid calcified plaque. Mid mild stenosis 30-49%  LCX is a non-dominant artery that gives rise to one large OM1 branch. There is no plaque.  Other findings:  Normal pulmonary vein drainage into the left atrium.  Normal left atrial appendage without a thrombus.  Normal size  of the pulmonary artery.  Please see radiology report for non cardiac findings.  IMPRESSION: 1. Coronary calcium  score of 55. This was 12 percentile for age and sex matched control.  2. Normal coronary origin with right dominance.  3. LAD is a large vessel that has focal mid calcified plaque. Mid mild stenosis 30-49%.  CAD-RADS 2. Mild non-obstructive CAD (25-49%). Consider non-atherosclerotic causes of chest pain. Consider preventive therapy and risk factor modification.  Electronically Signed: By: Oneil Parchment M.D. On: 09/29/2021 13:20     ______________________________________________________________________________________________       Current Reported Medications:.    Current Meds  Medication Sig   Accu-Chek Softclix Lancets lancets USE 1 LANCET IN THE MORNING , AT NOON AND AT BEDTIME   acyclovir  (ZOVIRAX ) 400 MG tablet TAKE 1 TABLET BY MOUTH DAILY (Patient taking differently: Take 400 mg by mouth daily. As needed)   allopurinol  (ZYLOPRIM ) 300 MG tablet TAKE 1 TABLET BY MOUTH  DAILY FOR GOUT PREVENTION   aspirin  81 MG tablet Take 81 mg by mouth daily.   atorvastatin  (LIPITOR) 20 MG tablet Take 1 tablet (20 mg total) by mouth daily.   Blood Glucose Monitoring Suppl (ACCU-CHEK GUIDE) w/Device KIT USE TO TEST IN THE MORNING , AT  NOON AND AT BEDTIME   Blood Glucose Monitoring Suppl DEVI 1 each by Does not apply route in the morning, at noon, and at bedtime. May substitute to any manufacturer covered by patient's insurance.   carbidopa -levodopa  (SINEMET  IR) 25-250 MG tablet TAKE 1 TABLET BY MOUTH  TWICE DAILY FOR RESTLESS  LEGS   Cyanocobalamin (B-12) 2500 MCG SUBL Place under the tongue every other day. 4 times a week   dextromethorphan-guaiFENesin  (MUCINEX  DM) 30-600 MG 12hr tablet Take 1 tablet by mouth 2 (two) times daily as needed for cough.   DULoxetine  (CYMBALTA ) 60 MG capsule TAKE 1 CAPSULE BY MOUTH AT  BEDTIME   ferrous sulfate 325 (65 FE) MG tablet Take 325 mg by  mouth daily with breakfast.   fexofenadine (ALLEGRA) 180 MG tablet Take 180 mg by mouth daily as needed.   fluticasone  (FLONASE ) 50 MCG/ACT nasal spray USE 2 SPRAYS IN BOTH NOSTRILS  ONCE DAILY   gabapentin  (NEURONTIN ) 600 MG tablet Take 1 tablet (600 mg total) by mouth 2 (two) times daily.   glucose blood (ONETOUCH ULTRA) test strip USE TO CHECK BLOOD SUGAR  DAILY   Magnesium  400 MG CAPS Take 800 mg by mouth at bedtime.   Melatonin 10 MG TABS Take 10 mg by mouth at bedtime.   meloxicam  (MOBIC ) 7.5 MG tablet Take 1 tablet Daily  with Food  is needed for Pain   metFORMIN  (GLUCOPHAGE -XR) 500 MG 24 hr tablet TAKE 2 TABLETS BY MOUTH  TWICE DAILY WITH MEALS FOR  DIABETES   methocarbamol  (ROBAXIN ) 500 MG tablet Take 500 mg by mouth 3 (three) times daily as needed. PRN   Multiple Vitamin (MULTIVITAMIN) tablet Take 1 tablet by mouth daily.   Naphazoline-Pheniramine (EYE ALLERGY RELIEF OP) Apply to eye.   olmesartan  (BENICAR ) 40 MG tablet TAKE 1 TABLET BY MOUTH AT NIGHT  FOR BLOOD PRESSURE AND DIABETIC  KIDNEY PROTECTION   Omega 3 1200 MG CAPS Take 1 capsule by mouth daily.   pantoprazole  (PROTONIX ) 40 MG tablet TAKE 1 TABLET DAILY TO  PREVENT HEARTBURN &amp;  INDIGESTION   propranolol  (INDERAL ) 40 MG tablet TAKE 1 TABLET BY MOUTH DAILY   tamsulosin  (FLOMAX ) 0.4 MG CAPS capsule TAKE 2 CAPSULES BY MOUTH AT  BEDTIME FOR PROSTATE   TRULICITY  4.5 MG/0.5ML SOPN Inject 1 pen  (4.5 mg) into Skin every 7 days   Vitamin D , Ergocalciferol , (DRISDOL ) 1.25 MG (50000 UNIT) CAPS capsule TAKE 1 CAPSULE BY MOUTH EVERY 7  DAYS   Physical Exam:    VS:  BP 138/84   Pulse 80   Ht 6' 2 (1.88 m)   Wt 283 lb (128.4 kg)   SpO2 97%   BMI 36.34 kg/m    Wt Readings from Last 3 Encounters:  11/16/23 283 lb (128.4 kg)  10/25/23 277 lb 9.6 oz (125.9 kg)  09/19/23 277 lb 3.2 oz (125.7 kg)  GEN: Well nourished, well developed in no acute distress NECK: No JVD; No carotid bruits CARDIAC: RRR, no murmurs, rubs,  gallops RESPIRATORY:  Clear to auscultation without rales, wheezing or rhonchi  ABDOMEN: Soft, non-tender, non-distended EXTREMITIES:  No edema; No acute deformity     Asessement and Plan:.    LVH/dyspnea on exertion: Echocardiogram was ordered for further evaluation.  Echocardiogram on 10/23/2023 indicated LVEF 60 to 65%, no RWMA, severe asymmetric LVH of the basal septal segment, G1 DD. Reviewed with Dr. Kate who recommended further evaluation with cardiac MRI. Today he reports that he continues to have increased dyspnea on exertion, denies any increased lower extremity edema, orthopnea or PND.  He denies any chest pain.  He is agreeable to proceeding with cardiac MRI.  Reviewed ED precautions. Check cardiac MRI given progression of LVH on echocardiograms.  Continue olmesartan  40 mg daily and propranolol  40 mg  daily.  Nonobstructive CAD: Coronary CTA 09/29/2021 indicated a coronary calcium  score of 55, this was 38 percentile for age and sex matched control, normal coronary origin with right dominance, mid mild stenosis of the LAD at 30 to 49%. Today he reports he continues to have some dyspnea on exertion, denies any chest pain, lower extremity edema, orthopnea or PND.  Will evaluate further with cardiac MRI as noted above.  Reviewed ED precautions.  Continue aspirin  81 mg daily, Lipitor 20 mg daily, propranolol  40 mg daily and olmesartan  40 mg daily.  Aortic dilation: CT angio chest aorta on 11//24 indicated stable mild fusiform aneurysmal dilation of the ascending thoracic aorta measuring up to 41 mm in greatest oblique short axis dimension, unchanged compared to 2013.  Echo on 10/23/2023 indicated mild dilation of the aortic root measuring 41 mm.  Patient to undergo cardiac MRI as noted above.  HTN: Initial blood pressure today 148/80, on recheck was 138/84.  Patient reports he has not yet taken his blood pressure medication today, will resume when he returns home.  Encouraged patient to monitor  his blood pressure at home, blood pressure goal less than 130/80.  Continue olmesartan  40 mg daily.  Type 2 DM: Last hemoglobin A1C 6.3% on 07/09/2023.  Monitored and managed per PCP.   HLD: Last lipid profile on 07/09/2023 indicated total cholesterol 98, HDL 35, triglycerides 160 and LDL 36.  Continue atorvastatin  20 mg daily.   Disposition: F/u with Dr. Court in 2-3 months.   Signed, Lauriana Denes D Kwane Rohl, NP

## 2023-11-16 ENCOUNTER — Encounter: Payer: Self-pay | Admitting: Cardiology

## 2023-11-16 ENCOUNTER — Ambulatory Visit: Attending: Cardiology | Admitting: Cardiology

## 2023-11-16 VITALS — BP 138/84 | HR 80 | Ht 74.0 in | Wt 283.0 lb

## 2023-11-16 DIAGNOSIS — I2510 Atherosclerotic heart disease of native coronary artery without angina pectoris: Secondary | ICD-10-CM | POA: Diagnosis not present

## 2023-11-16 DIAGNOSIS — I517 Cardiomegaly: Secondary | ICD-10-CM | POA: Diagnosis not present

## 2023-11-16 DIAGNOSIS — R0602 Shortness of breath: Secondary | ICD-10-CM | POA: Diagnosis not present

## 2023-11-16 DIAGNOSIS — I7121 Aneurysm of the ascending aorta, without rupture: Secondary | ICD-10-CM

## 2023-11-16 DIAGNOSIS — I10 Essential (primary) hypertension: Secondary | ICD-10-CM

## 2023-11-16 DIAGNOSIS — E782 Mixed hyperlipidemia: Secondary | ICD-10-CM

## 2023-11-16 NOTE — Patient Instructions (Signed)
 Medication Instructions:  Your physician recommends that you continue on your current medications as directed. Please refer to the Current Medication list given to you today.   Labwork: CBC within 1-2 weeks prior to Cardiac MRI  Testing/Procedures:   You are scheduled for Cardiac MRI at the location below.  Please arrive for your appointment at ______________ . ?  [facility] [address] [place], [place] [address] Please take advantage of the free valet parking available at the [facility] and [employer] (Entrance C).  Proceed to the [facility] Radiology Department (First Floor) for check-in.   OR   [facility] [address] [place], [place] [address] Please go to the [facility] and check-in with the desk attendant.   Magnetic resonance imaging (MRI) is a painless test that produces images of the inside of the body without using Xrays.  During an MRI, strong magnets and radio waves work together in a data processing manager to form detailed images.   MRI images may provide more details about a medical condition than X-rays, CT scans, and ultrasounds can provide.  You may be given earphones to listen for instructions.  You may eat a light breakfast and take medications as ordered with the exception of furosemide , hydrochlorothiazide, chlorthalidone or spironolactone (or any other fluid pill). If you are undergoing a stress MRI, please avoid stimulants for 12 hr prior to test. (I.e. Caffeine, nicotine, chocolate, or antihistamine medications)  If your provider has ordered anti-anxiety medications for this test, then you will need a driver.  An IV will be inserted into one of your veins. Contrast material will be injected into your IV. It will leave your body through your urine within a day. You may be told to drink plenty of fluids to help flush the contrast material out of your system.  You will be asked to remove all metal, including:  Watch, jewelry, and other metal objects including hearing aids, hair pieces and dentures. Also wearable glucose monitoring systems (ie. Freestyle Libre and Omnipods) (Braces and fillings normally are not a problem.)   TEST WILL TAKE APPROXIMATELY 1 HOUR  PLEASE NOTIFY SCHEDULING AT LEAST 24 HOURS IN ADVANCE IF YOU ARE UNABLE TO KEEP YOUR APPOINTMENT. [phone]  For more information and frequently asked questions, please visit our website : [email or web address]  Please call the Cardiac Imaging Nurse Navigators with any questions/concerns. [phone] Office    Follow-Up: Your physician recommends that you schedule a follow-up appointment in: 2-3 months  Any Other Special Instructions Will Be Listed Below (If Applicable). Thank you for choosing [facility]!     If you need a refill on your cardiac medications before your next appointment, please call your pharmacy.

## 2023-11-19 ENCOUNTER — Encounter (HOSPITAL_BASED_OUTPATIENT_CLINIC_OR_DEPARTMENT_OTHER): Payer: Self-pay | Admitting: Family Medicine

## 2023-11-19 ENCOUNTER — Ambulatory Visit (INDEPENDENT_AMBULATORY_CARE_PROVIDER_SITE_OTHER): Admitting: Family Medicine

## 2023-11-19 VITALS — BP 159/96 | HR 64 | Temp 97.6°F | Resp 18 | Ht 74.0 in | Wt 282.0 lb

## 2023-11-19 DIAGNOSIS — E1142 Type 2 diabetes mellitus with diabetic polyneuropathy: Secondary | ICD-10-CM

## 2023-11-19 DIAGNOSIS — Z23 Encounter for immunization: Secondary | ICD-10-CM

## 2023-11-19 DIAGNOSIS — G2581 Restless legs syndrome: Secondary | ICD-10-CM

## 2023-11-19 DIAGNOSIS — M5489 Other dorsalgia: Secondary | ICD-10-CM

## 2023-11-19 DIAGNOSIS — I10 Essential (primary) hypertension: Secondary | ICD-10-CM

## 2023-11-19 DIAGNOSIS — G8929 Other chronic pain: Secondary | ICD-10-CM

## 2023-11-19 DIAGNOSIS — Z7984 Long term (current) use of oral hypoglycemic drugs: Secondary | ICD-10-CM

## 2023-11-19 LAB — POCT GLYCOSYLATED HEMOGLOBIN (HGB A1C): Hemoglobin A1C: 6.4 % — AB (ref 4.0–5.6)

## 2023-11-19 MED ORDER — TRULICITY 4.5 MG/0.5ML ~~LOC~~ SOAJ: SUBCUTANEOUS | 3 refills | Status: AC

## 2023-11-19 MED ORDER — DULOXETINE HCL 60 MG PO CPEP: 60.0000 mg | ORAL_CAPSULE | Freq: Every day | ORAL | 3 refills | Status: AC

## 2023-11-19 MED ORDER — ACCU-CHEK GUIDE TEST VI STRP: 1.0000 | ORAL_STRIP | Freq: Every day | 12 refills | Status: AC

## 2023-11-19 NOTE — Assessment & Plan Note (Signed)
 Hemoglobin A1c has been well-controlled with current regimen of Trulicity , metformin .  No current issues with medications at the present moment.  Recent A1c continues to be well-controlled at 6.3%.   Point-of-care A1c today remains at goal at 6.4% Foot exam completed in office today, documented in chart Urine ACR to be completed today

## 2023-11-19 NOTE — Progress Notes (Signed)
    Procedures performed today:    None.  Independent interpretation of notes and tests performed by another provider:   None.  Brief History, Exam, Impression, and Recommendations:    BP (!) 159/96 (BP Location: Left Arm, Patient Position: Sitting, Cuff Size: Large) Comment: hasn't taken meds for today  Pulse 64   Temp 97.6 F (36.4 C) (Oral)   Resp 18   Ht 6' 2 (1.88 m)   Wt 282 lb (127.9 kg)   SpO2 97%   BMI 36.21 kg/m   DM type 2 with diabetic peripheral neuropathy (HCC) Assessment & Plan: Hemoglobin A1c has been well-controlled with current regimen of Trulicity , metformin .  No current issues with medications at the present moment.  Recent A1c continues to be well-controlled at 6.3%.   Point-of-care A1c today remains at goal at 6.4% Foot exam completed in office today, documented in chart Urine ACR to be completed today  Orders: -     POCT glycosylated hemoglobin (Hb A1C) -     Accu-Chek Guide Test; 1 each by Other route daily. Use as instructed  Dispense: 100 each; Refill: 12 -     Trulicity ; Inject 1 pen  (4.5 mg) into Skin every 7 days  Dispense: 6 mL; Refill: 3 -     Microalbumin / creatinine urine ratio  Other chronic back pain -     DULoxetine  HCl; Take 1 capsule (60 mg total) by mouth at bedtime.  Dispense: 90 capsule; Refill: 3  Immunization due -     Flu vaccine HIGH DOSE PF(Fluzone Trivalent) -     Pneumococcal Conjugate PCV21(Capvaxive)  RLS Assessment & Plan: Patient continues with duloxetine .  Does wonder about possibly adjusting medication for possible better control of symptoms. For now, can continue with current regimen.  Can monitor progress with symptoms to determine if change in therapy would be warranted.   Primary hypertension Assessment & Plan: Blood pressure elevated in office today.  Has been better controlled at prior office visits, recheck in office today is improved, still above goal.  Can continue with current medication regimen.   Recommend intermittent monitoring of blood pressure at home, DASH diet   Patient did bring form in today related to Trulicity .  He does receive this through [employer] as part of patient assistance program.  The form that he had today was his old 1 from last year.  He did receive a letter that he will need application submitted for renewal.  Reviewed letter with patient today.  Advised that he will need to print off new application, fill this out and bring back to the office for us  to complete and faxed to pharmaceutical company.  He indicates that he intends to do so.  Prescription for Trulicity  sent to required specialty pharmacy for this program.  Return in about 4 months (around 03/18/2024).  Spent 32 minutes on this patient encounter, including preparation, chart review, face-to-face counseling with patient and coordination of care, and documentation of encounter   ___________________________________________ Lerline Valdivia [place], MD, ABFM, [facility]

## 2023-11-19 NOTE — Assessment & Plan Note (Signed)
 Patient continues with duloxetine .  Does wonder about possibly adjusting medication for possible better control of symptoms. For now, can continue with current regimen.  Can monitor progress with symptoms to determine if change in therapy would be warranted.

## 2023-11-19 NOTE — Assessment & Plan Note (Signed)
 Blood pressure elevated in office today.  Has been better controlled at prior office visits, recheck in office today is improved, still above goal.  Can continue with current medication regimen.  Recommend intermittent monitoring of blood pressure at home, DASH diet

## 2023-11-20 ENCOUNTER — Other Ambulatory Visit: Payer: Self-pay | Admitting: Cardiology

## 2023-11-20 ENCOUNTER — Ambulatory Visit: Payer: Self-pay | Admitting: Cardiology

## 2023-11-20 ENCOUNTER — Ambulatory Visit (HOSPITAL_COMMUNITY)
Admission: RE | Admit: 2023-11-20 | Discharge: 2023-11-20 | Disposition: A | Discharge status: Home / Self Care | Source: Ambulatory Visit | Attending: Cardiology | Admitting: Cardiology

## 2023-11-20 DIAGNOSIS — I517 Cardiomegaly: Secondary | ICD-10-CM | POA: Diagnosis not present

## 2023-11-20 LAB — MICROALBUMIN / CREATININE URINE RATIO
Creatinine, Urine: 28.5 mg/dL
Microalb/Creat Ratio: 109 mg/g{creat} — ABNORMAL HIGH (ref 0–29)
Microalbumin, Urine: 31.1 ug/mL

## 2023-11-20 MED ORDER — GADOBUTROL 1 MMOL/ML IV SOLN
15.0000 mL | Freq: Once | INTRAVENOUS | Status: AC | PRN
  Administered 2023-11-20: 15 mL via INTRAVENOUS

## 2023-11-21 ENCOUNTER — Ambulatory Visit (HOSPITAL_BASED_OUTPATIENT_CLINIC_OR_DEPARTMENT_OTHER): Payer: Self-pay | Admitting: Family Medicine

## 2023-11-22 ENCOUNTER — Ambulatory Visit: Payer: Self-pay | Admitting: Cardiology

## 2023-11-22 LAB — HEMOGLOBIN AND HEMATOCRIT, BLOOD
Hematocrit: 40.2 % (ref 37.5–51.0)
Hemoglobin: 13.6 g/dL (ref 13.0–17.7)

## 2023-11-22 NOTE — Telephone Encounter (Addendum)
 LVM letting pt know his appt was schedule with Katlyn West for 11/13 at 2:45 pm and to call our office if this day and time does not work for him. 1st attempt

## 2023-11-28 NOTE — Progress Notes (Unsigned)
 Cardiology Office Note    Date:  11/29/2023  ID:  Jason Wyatt, DOB 1955/12/11, MRN [id number] PCP:  de [place], Raymond J, MD  Cardiologist:  Dorn Lesches, MD  Electrophysiologist:  None   Chief Complaint: Follow up for shortness of breath  History of Present Illness: .   Jason Wyatt is a 68 y.o. male with visit-pertinent history of PMH of hypertension, peripheral vascular disease, OSA on CPAP, type 2 diabetes, hyperlipidemia, RLS, gait instability, insomnia, shortness of breath, and obesity.  He underwent cardiac catheterization in 2013 which was normal.  His echocardiogram in 2020 showed mild LVH and diastolic dysfunction.   He is followed by Dr. Lesches.  He was seen by Dr. Jordan on 09/09/2021.  During that time he was being evaluated for shortness of breath.  He reported dyspnea and chest tightness with exertion.  His symptoms had been ongoing for more than a year.  He reported having COVID the prior year.  He reported that his symptoms had started before he contracted COVID.  He was unable to do housework without stopping to rest.  He had no lower extremity swelling.  He had lost weight.  He reported rare palpitations.  He was using his CPAP regularly.  He was having increased stress with his wife being on hospice.  A echocardiogram repeated at that time showed an LVEF of 60-65%, G1 DD, mild dilation of aortic root measuring 39 mm and ascending aortic dilation measuring 41 mm.  He had coronary CTA on 09/29/2021.  He was noted to have a coronary calcium  score of 55 which placed him in 38 percentile for age and sex matched control.  He was noted to have mid stenosis of his LAD 30-49% and medical management was recommended.   Patient was seen in clinic on 09/19/2022.  Patient reported that he was doing well at that time.  He denied any cardiac concerns or complaints.   Patient was seen clinic on 09/19/23, noted he was doing well overall. He did report some increased dyspnea on exertion, questioned  if it had worsened in recent months. He denied chest pain, tightness of pressure. Echocardiogram was ordered for further evaluation.  Echocardiogram on 10/23/2023 indicated LVEF 60 to 65%, no RWMA, severe asymmetric LVH of the basal septal segment, G1 DD, RV systolic function and size was normal, normal PASP, no evidence of mitral valve digitation or stenosis, mild calcification of the aortic valve, no aortic valve stenosis present, mild dilation of the aortic root measuring 41 mm.   Patient was last seen in clinic on 11/16/2023 for follow-up.  He denied any chest pain, noted some shoulder pain related to spine history with multiple interventions.  He continued to note some increased shortness of breath with prolonged exertion, denied any increased lower extremity edema, orthopnea or PND.  He denied any increased palpitations, presyncope or syncope.  Patient reported that he was using his CPAP nightly.  Given LVH noted on echo cardiac MRI was ordered for further evaluation.  Cardiac MRI on 11/20/2023 indicated severe asymmetric hypertrophy of the basal septum up to 1.9 cm, systolic function was normal with LVEF 57%, no RWMA, chordal SAM was present, consistent with hypertrophic cardiomyopathy.  RV ventricle was normal in cavity size with moderately reduced function, RVEF 35%, global hypokinesis with akinesis of the mid RV inferior wall, no significant dilation or aneurysms of the RV noted, septum flattened in systole/diastole consistent with RV volume/pressure overload, workup for pulmonary hypertension was recommended.  Today  he presents for follow-up and to review his cardiac MRI.  Patient denies any recent significant changes, continues to note dyspnea on exertion and increased fatigue with prolonged exertion.  He denies any significant chest pain, notes an occasional fleeting pressure-like sensation the chest that he feels is likely more so related to gas pain.  He denies any lower extremity edema, orthopnea  or PND.  He denies any palpitations, presyncope or syncope.  Patient reports CPAP adherence however on further discussion notes that he will regularly nap throughout the day and does not use his CPAP. ROS: .   Today he denies chest pain, shortness of breath, lower extremity edema, fatigue, palpitations, melena, hematuria, hemoptysis, diaphoresis, weakness, presyncope, syncope, orthopnea, and PND.  All other systems are reviewed and otherwise negative. Studies Reviewed: SABRA   EKG:  EKG is ordered today, personally reviewed, demonstrating  EKG Interpretation Date/Time:  Thursday November 29 2023 14:35:58 EST Ventricular Rate:  75 PR Interval:  226 QRS Duration:  138 QT Interval:  402 QTC Calculation: 448 R Axis:   -74  Text Interpretation: Sinus rhythm with 1st degree A-V block Left axis deviation Right bundle branch block Possible Lateral infarct (cited on or before 16-Aug-2020) Inferior infarct (cited on or before 03-Aug-2011) Confirmed by Maciel Kegg [id number]) on 11/29/2023 6:22:48 PM   CV Studies: Cardiac studies reviewed are outlined and summarized above. Otherwise please see EMR for full report. Cardiac Studies & Procedures   ______________________________________________________________________________________________     ECHOCARDIOGRAM  ECHOCARDIOGRAM COMPLETE 10/23/2023  Narrative ECHOCARDIOGRAM REPORT    Patient Name:   Jason Wyatt Date of Exam: 10/23/2023 Medical Rec #:  [id number]      Height:       74.0 in Accession #:    [id number]     Weight:       277.2 lb Date of Birth:  Sep 11, 1955       BSA:          2.497 m Patient Age:    68 years       BP:           171/106 mmHg Patient Gender: M              HR:           65 bpm. Exam Location:  [address]  Procedure: 2D Echo, Cardiac Doppler, Color Doppler and Intracardiac Opacification Agent (Both Spectral and Color Flow Doppler were utilized during procedure).  Indications:    R06.09 SOB  History:        Patient has  prior history of Echocardiogram examinations, most recent 09/21/2021. PVD; Risk Factors:Hypertension, Diabetes, Sleep Apnea and HLD.  Sonographer:    Waldo Guadalajara RCS Referring Phys: [id number] Ignatius Kloos D Airika Alkhatib  IMPRESSIONS   1. Left ventricular ejection fraction, by estimation, is 60 to 65%. The left ventricle has normal function. The left ventricle has no regional wall motion abnormalities. There is severe asymmetric left ventricular hypertrophy of the basal-septal segment. Left ventricular diastolic parameters are consistent with Grade I diastolic dysfunction (impaired relaxation). 2. Right ventricular systolic function is normal. The right ventricular size is normal. There is normal pulmonary artery systolic pressure. 3. The mitral valve is normal in structure. No evidence of mitral valve regurgitation. No evidence of mitral stenosis. 4. The aortic valve is tricuspid. There is mild calcification of the aortic valve. Aortic valve regurgitation is not visualized. No aortic stenosis is present. 5. Aortic dilatation noted. There is mild dilatation of the aortic root, measuring  41 mm.  Comparison(s): No significant change from prior study. Prior images reviewed side by side.  FINDINGS Left Ventricle: Sigmoing septum 17 mm with no LVOT flow accleration or SAM or the mitral valve. Left ventricular ejection fraction, by estimation, is 60 to 65%. The left ventricle has normal function. The left ventricle has no regional wall motion abnormalities. The left ventricular internal cavity size was normal in size. There is severe asymmetric left ventricular hypertrophy of the basal-septal segment. Left ventricular diastolic parameters are consistent with Grade I diastolic dysfunction (impaired relaxation). Normal left ventricular filling pressure.  Right Ventricle: The right ventricular size is normal. No increase in right ventricular wall thickness. Right ventricular systolic function is normal. There is  normal pulmonary artery systolic pressure. The tricuspid regurgitant velocity is 2.31 m/s, and with an assumed right atrial pressure of 3 mmHg, the estimated right ventricular systolic pressure is 24.3 mmHg.  Left Atrium: Left atrial size was normal in size.  Right Atrium: Right atrial size was normal in size.  Pericardium: Trivial pericardial effusion is present. The pericardial effusion is circumferential.  Mitral Valve: The mitral valve is normal in structure. No evidence of mitral valve regurgitation. No evidence of mitral valve stenosis.  Tricuspid Valve: The tricuspid valve is normal in structure. Tricuspid valve regurgitation is mild . No evidence of tricuspid stenosis.  Aortic Valve: The aortic valve is tricuspid. There is mild calcification of the aortic valve. Aortic valve regurgitation is not visualized. No aortic stenosis is present.  Pulmonic Valve: The pulmonic valve was normal in structure. Pulmonic valve regurgitation is not visualized. No evidence of pulmonic stenosis.  Aorta: Aortic dilatation noted. There is mild dilatation of the aortic root, measuring 41 mm.  IAS/Shunts: The atrial septum is grossly normal.   LEFT VENTRICLE PLAX 2D LVIDd:         5.20 cm   Diastology LVIDs:         3.20 cm   LV e' medial:    6.09 cm/s LV PW:         1.00 cm   LV E/e' medial:  9.9 LV IVS:        1.10 cm   LV e' lateral:   6.20 cm/s LVOT diam:     2.40 cm   LV E/e' lateral: 9.7 LV SV:         86 LV SV Index:   35 LVOT Area:     4.52 cm LV IVRT:       109 msec   RIGHT VENTRICLE RV Basal diam:  3.00 cm RV S prime:     11.30 cm/s TAPSE (M-mode): 1.8 cm RVSP:           24.3 mmHg  LEFT ATRIUM             Index        RIGHT ATRIUM           Index LA diam:        3.80 cm 1.52 cm/m   RA Pressure: 3.00 mmHg LA Vol (A2C):   52.0 ml 20.83 ml/m  RA Area:     11.90 cm LA Vol (A4C):   48.0 ml 19.22 ml/m  RA Volume:   23.40 ml  9.37 ml/m LA Biplane Vol: 52.9 ml 21.19  ml/m AORTIC VALVE LVOT Vmax:   99.80 cm/s LVOT Vmean:  62.500 cm/s LVOT VTI:    0.191 m  AORTA Ao Root diam: 4.10 cm Ao Asc diam:  3.50 cm  MITRAL VALVE               TRICUSPID VALVE MV Area (PHT):             TR Peak grad:   21.3 mmHg MV Decel Time:             TR Vmax:        231.00 cm/s MV E velocity: 60.30 cm/s  Estimated RAP:  3.00 mmHg MV A velocity: 71.30 cm/s  RVSP:           24.3 mmHg MV E/A ratio:  0.85 SHUNTS Systemic VTI:  0.19 m Systemic Diam: 2.40 cm  Stanly Leavens MD Electronically signed by Stanly Leavens MD Signature Date/Time: 10/23/2023/12:12:54 PM    Final      CT SCANS  CT CORONARY MORPH W/CTA COR W/SCORE 09/29/2021  Addendum 09/29/2021  1:50 PM ADDENDUM REPORT: 09/29/2021 13:48  EXAM: OVER-READ INTERPRETATION  CT CHEST  The following report is an over-read performed by radiologist Dr. Lynwood Seip [facility] on 09/29/2021. This over-read does not include interpretation of cardiac or coronary anatomy or pathology. The coronary CTA interpretation by the cardiologist is attached.  COMPARISON:  August 03, 2011.  FINDINGS: 4 cm ascending thoracic aortic aneurysm is noted. No definite abnormality is seen involving the visualized mediastinum. No definite abnormality seen involving the visualized portion of the upper abdomen. Visualized pulmonary parenchyma is unremarkable. Visualized skeleton is unremarkable.  IMPRESSION: 4 cm ascending thoracic aortic aneurysm. Recommend annual imaging followup by CTA or MRA. This recommendation follows 2010 ACCF/AHA/AATS/ACR/ASA/SCA/SCAI/SIR/STS/SVM Guidelines for the Diagnosis and Management of Patients with Thoracic Aortic Disease. Circulation. 2010; 121: Z733-z630. Aortic aneurysm NOS (ICD10-I71.9). These results will be called to the ordering clinician or representative by the Radiologist Assistant, and communication documented in the PACS or zVision  Dashboard.   Electronically Signed By: Lynwood Seip Raddle M.D. On: 09/29/2021 13:48  Narrative CLINICAL DATA:  68 year old with chest pain.  EXAM: Cardiac/Coronary  CTA  TECHNIQUE: The patient was scanned on a [employer].  FINDINGS: A 120 kV prospective scan was triggered in the descending thoracic aorta at 111 HU's. Axial non-contrast 3 mm slices were carried out through the heart. The data set was analyzed on a dedicated work station and scored using the Agatson method. Gantry rotation speed was 250 msecs and collimation was .6 mm. 0.8 mg of sl NTG was given. The 3D data set was reconstructed in 5% intervals of the 67-82 % of the R-R cycle. Diastolic phases were analyzed on a dedicated work station using MPR, MIP and VRT modes. The patient received 80 cc of contrast.  Image quality: Good, mild misregistration artifact. Distal vessels are small in caliber and difficult to visualize.  Aorta:  Normal size.  No calcifications.  No dissection.  Aortic Valve: No calcifications.  Coronary Arteries:  Normal coronary origin.  Right dominance.  RCA is a large dominant artery that gives rise to PDA and PLA. There is no plaque.  Left main is a large artery that gives rise to LAD and Ramus/LCX arteries.  LAD is a large vessel that has focal mid calcified plaque. Mid mild stenosis 30-49%  LCX is a non-dominant artery that gives rise to one large OM1 branch. There is no plaque.  Other findings:  Normal pulmonary vein drainage into the left atrium.  Normal left atrial appendage without a thrombus.  Normal size of the pulmonary artery.  Please see radiology report for non cardiac  findings.  IMPRESSION: 1. Coronary calcium  score of 55. This was 9 percentile for age and sex matched control.  2. Normal coronary origin with right dominance.  3. LAD is a large vessel that has focal mid calcified plaque. Mid mild stenosis 30-49%.  CAD-RADS 2. Mild  non-obstructive CAD (25-49%). Consider non-atherosclerotic causes of chest pain. Consider preventive therapy and risk factor modification.  Electronically Signed: By: Oneil Parchment M.D. On: 09/29/2021 13:20   CARDIAC MRI  MR CARDIAC MORPHOLOGY W WO CONTRAST 11/20/2023  Narrative CLINICAL DATA:  Severe LVH  EXAM: MR CARDIA MORPHOLOGY WITHOUT AND WITH CONTRAST; MR CARDIAC VELOCITY FLOW MAPPING  TECHNIQUE: The patient was scanned on a 1.5 Tesla Siemens magnet. A dedicated cardiac coil was used. Functional imaging was done using TrueFisp sequences. 2,3, and 4 chamber views were done to assess for RWMA's. Modified Simpson's rule using a short axis stack was used to calculate an ejection fraction on a dedicated work [employer]. The patient received 15mL GADAVIST GADOBUTROL 1 MMOL/ML IV SOLN. After 10 minutes inversion recovery sequences were used to assess for infiltration and scar tissue. Phase contrast velocity encoded images obtained x 2.  This examination is tailored for evaluation cardiac anatomy and function and provides very limited assessment of noncardiac structures, which are accordingly not evaluated during interpretation. If there is clinical concern for extracardiac pathology, further evaluation with CT imaging should be considered.  FINDINGS: LEFT VENTRICLE: Normal left ventricular size. Maximum septal wall thickness: 1.9 cm. Posterior wall thickness 1.4 cm. Left ventricular internal diameter (diastole): 3.7 cm. There are no regional wall motion abnormalities. Severe symmetric hypertrophy of the basal septum up 1.9 cm. Chordal SAM noted.  LV EF: 56% (Normal 49-79%)  Absolute volumes:  LV EDV: 97 mL (Normal 95-215 mL)  LV ESV: 42 mL (Normal 25-85 mL)  LV SV: 55 mL (Normal 61-145 mL)  CO: 3.9 L/min (Normal 3.4-7.8 L/min)  Indexed volumes:  LV EDV: 38 mL/sq-m (Normal 50-108 mL/sq-m)  LV ESV: 16 mL/sq-m (Normal 11-47 mL/sq-m)  LV SV:  21 mL/sq-m (Normal 33-72 mL/sq-m)  CI: 1.5 L/min/sq-m (Normal 1.8-4.2 L/min/sq-m)  RIGHT VENTRICLE: Normal right ventricular chamber size. Normal right ventricular wall thickness. Moderately reduced right ventricular systolic function. Global hypokinesis with akinesis of the mid RV inferior wall. No significant dilation or aneurysms of the RV noted. The septum is flattened in systole/diastole consistent with RV volume/pressure overload.  RV EF:  35% (Normal 51-80%)  Absolute volumes:  RV EDV: 127 mL (Normal 109-217 mL)  RV ESV: 83 mL (Normal 23-91 mL)  RV SV: 44 mL (Normal 71-141 mL)  CO: 3.1 L/min (Normal 2.8-8.8 L/min)  Indexed volumes:  RV EDV: 49 mL/sq-m (Normal 58-109 mL/sq-m)  RV ESV: 32 mL/sq-m (Normal 12-46 mL/sq-m)  RV SV: 17 mL/sq-m (Normal 38-71 mL/sq-m)  CI: 1.2 L/min/sq-m (Normal 1.7-4.2 L/min/sq-m)  Left atrium: Normal size.  Right atrium: Normal size.  Mitral valve: Normal mitral valve without regurgitation.  Aortic valve: Tricuspid aortic valve without regurgitation.  Tricuspid valve: Normal tricuspid valve without regurgitation.  Pulmonic valve: Normal pulmonary valve without regurgitation.  Aorta: The aortic root is normal. Mildly dilated ascending aorta up to 41 mm.  Pericardium: The pericardium is normal thickness. There is no pericardial effusion.  There is no evidence of intracardiac thrombus.  Systemic flow across the aortic valve (QS) was 3.7 L/min.  Pulmonary flow across the pulmonic valve (QP) was 4.6 L/min.  The QP:QS calculated across the aortic and pulmonic valves was 1.3.  Native myocardial T1-relaxation times were abnormal at 1071 ms.  Native myocardial T2-relaxation times were normal at 52 ms.  ECV was normal at 31%.  Delayed Enhancement: Small, focal area of LGE present at the posterior RV insertion site. This is non-CAD scar and can be seen with pulmonary hypertension. No evidence of myocardial  infarction, inflammation, or infiltrative disorder.  Extracardiac structures: No significant findings  IMPRESSION: 1. Severe asymmetric hypertrophy of the basal septum up to 1.9 cm. Systolic function was normal with LVEF 57%. No regional wall motion abnormalities. Chordal SAM is present. Findings are consistent with hypertrophic cardiomyopathy.  2. The right ventricle is normal in cavity size with moderately reduced function, RVEF 35%. Global hypokinesis with akinesis of the mid RV inferior wall. No significant dilation or aneurysms of the RV noted. The septum is flattened in systole/diastole consistent with RV volume/pressure overload. No significant tricuspid regurgitation to explain the RV dysfunction. Further work-up for pulmonary hypertension is recommended.  3. No significant valvular heart disease.  4. Small focal area of LGE present at the posterior RV insertion site. This is a non-specific finding and can be seen in pulmonary hypertension. No evidence of myocardial infarction, inflammation, or infiltrative disorder.  5. Mildly dilated ascending aorta up to 41 mm.   Electronically Signed By: Darryle Decent M.D. On: 11/21/2023 21:16   ______________________________________________________________________________________________       Current Reported Medications:.    Current Meds  Medication Sig   Accu-Chek Softclix Lancets lancets USE 1 LANCET IN THE MORNING , AT NOON AND AT BEDTIME   acyclovir  (ZOVIRAX ) 400 MG tablet TAKE 1 TABLET BY MOUTH DAILY (Patient taking differently: Take 400 mg by mouth daily. As needed)   allopurinol  (ZYLOPRIM ) 300 MG tablet TAKE 1 TABLET BY MOUTH  DAILY FOR GOUT PREVENTION   aspirin  81 MG tablet Take 81 mg by mouth daily.   atorvastatin  (LIPITOR) 20 MG tablet Take 1 tablet (20 mg total) by mouth daily.   Blood Glucose Monitoring Suppl (ACCU-CHEK GUIDE) w/Device KIT USE TO TEST IN THE MORNING , AT  NOON AND AT BEDTIME   Blood Glucose  Monitoring Suppl DEVI 1 each by Does not apply route in the morning, at noon, and at bedtime. May substitute to any manufacturer covered by patient's insurance.   carbidopa -levodopa  (SINEMET  IR) 25-250 MG tablet TAKE 1 TABLET BY MOUTH  TWICE DAILY FOR RESTLESS  LEGS   Cyanocobalamin (B-12) 2500 MCG SUBL Place under the tongue every other day. 4 times a week   dextromethorphan-guaiFENesin  (MUCINEX  DM) 30-600 MG 12hr tablet Take 1 tablet by mouth 2 (two) times daily as needed for cough.   DULoxetine  (CYMBALTA ) 60 MG capsule Take 1 capsule (60 mg total) by mouth at bedtime.   ferrous sulfate 325 (65 FE) MG tablet Take 325 mg by mouth daily with breakfast.   fexofenadine (ALLEGRA) 180 MG tablet Take 180 mg by mouth daily as needed.   fluticasone  (FLONASE ) 50 MCG/ACT nasal spray USE 2 SPRAYS IN BOTH NOSTRILS  ONCE DAILY   gabapentin  (NEURONTIN ) 600 MG tablet Take 1 tablet (600 mg total) by mouth 2 (two) times daily.   glucose blood (ACCU-CHEK GUIDE TEST) test strip 1 each by Other route daily. Use as instructed   glucose blood (ONETOUCH ULTRA) test strip USE TO CHECK BLOOD SUGAR  DAILY   Magnesium  400 MG CAPS Take 800 mg by mouth at bedtime.   Melatonin 10 MG TABS Take 10 mg by mouth at bedtime.   meloxicam  (MOBIC ) 7.5  MG tablet Take 1 tablet Daily  with Food  is needed for Pain   metFORMIN  (GLUCOPHAGE -XR) 500 MG 24 hr tablet TAKE 2 TABLETS BY MOUTH  TWICE DAILY WITH MEALS FOR  DIABETES   methocarbamol  (ROBAXIN ) 500 MG tablet Take 500 mg by mouth 3 (three) times daily as needed. PRN   Multiple Vitamin (MULTIVITAMIN) tablet Take 1 tablet by mouth daily.   mupirocin  ointment (BACTROBAN ) 2 % Apply 1 Application topically 2 (two) times daily.   Naphazoline-Pheniramine (EYE ALLERGY RELIEF OP) Apply to eye.   olmesartan  (BENICAR ) 40 MG tablet TAKE 1 TABLET BY MOUTH AT NIGHT  FOR BLOOD PRESSURE AND DIABETIC  KIDNEY PROTECTION   Omega 3 1200 MG CAPS Take 1 capsule by mouth daily.   pantoprazole  (PROTONIX ) 40  MG tablet TAKE 1 TABLET DAILY TO  PREVENT HEARTBURN &amp;  INDIGESTION   propranolol  (INDERAL ) 40 MG tablet TAKE 1 TABLET BY MOUTH DAILY   tamsulosin  (FLOMAX ) 0.4 MG CAPS capsule TAKE 2 CAPSULES BY MOUTH AT  BEDTIME FOR PROSTATE   TRULICITY  4.5 MG/0.5ML SOAJ Inject 1 pen  (4.5 mg) into Skin every 7 days   Vitamin D , Ergocalciferol , (DRISDOL ) 1.25 MG (50000 UNIT) CAPS capsule TAKE 1 CAPSULE BY MOUTH EVERY 7  DAYS   Physical Exam:    VS:  BP 130/78   Pulse 68   Ht 6' 2 (1.88 m)   Wt 288 lb (130.6 kg)   SpO2 99%   BMI 36.98 kg/m    Wt Readings from Last 3 Encounters:  11/29/23 288 lb (130.6 kg)  11/19/23 282 lb (127.9 kg)  11/16/23 283 lb (128.4 kg)    GEN: Well nourished, well developed in no acute distress NECK: No JVD; No carotid bruits CARDIAC: RRR, 2/6 systolic murmur best heard at left sternal border, no rubs or gallops RESPIRATORY:  Clear to auscultation without rales, wheezing or rhonchi  ABDOMEN: Soft, non-tender, non-distended EXTREMITIES:  No edema; No acute deformity     Asessement and Plan:.    RV systolic dysfunction/DOE/nonobstructive CAD: Coronary CTA 09/29/2021 indicated a coronary calcium  score of 55,normal coronary origin with right dominance, mid mild stenosis of the LAD at 30 to 49%. Cardiac MRI on 11/20/23 indicated RV ventricle was normal in cavity size with moderately reduced function, RVEF 35%, global hypokinesis with akinesis of the mid RV inferior wall, no significant dilation or aneurysms of the RV noted, septum flattened in systole/diastole consistent with RV volume/pressure overload, workup for pulmonary hypertension was recommended. Discussed with Dr. Barbaraann and Dr. Cherrie recommended proceeding with [facility]. Today patient reports that he continues to have dyspnea on exertion and has noted some increased fatigue.  He denies any significant chest pain, notes a very rare fleeting chest pressure.  He appears euvolemic however body habitus limits evaluation.   Patient agreeable to proceeding with right and left cardiac catheterization given progressive dyspnea on exertion and abnormal cardiac MRI results.  Please see consent below.  Check CBC, CMET and ProBNP. Reviewed ED precautions. Patient will hold Trulicity  Saturday and metformin  day of procedure. Patient has been scheduled for cardiac catheterization with Dr. Cherrie on 12/07/23. Continue aspirin  81 mg daily.  Informed Consent   Shared Decision Making/Informed Consent The risks [stroke (1 in 1000), death (1 in 1000), kidney failure [usually temporary] (1 in 500), bleeding (1 in 200), allergic reaction [possibly serious] (1 in 200)], benefits (diagnostic support and management of coronary artery disease) and alternatives of a cardiac catheterization were discussed in detail with Jason Wyatt  and he is willing to proceed.      LVH:  Cardiac MRI on 11/20/2023 indicated severe asymmetric hypertrophy of the basal septum up to 1.9 cm, systolic function was normal with LVEF 57%, no RWMA, chordal SAM was present, consistent with hypertrophic cardiomyopathy. Consider referral to HCM clinic following catheterization.  Continue olmesartan  and propranolol .  Severe OSA: Patient with long history of severe OSA.  Currently on CPAP followed by Dr. Chalice.  Patient reports adherence with CPAP however notes that he does not regularly nap throughout the day and does not use CPAP, adherence encouraged.  HTN: Blood pressure today 130/78.  Continue current antihypertensive regimen.  Type 2 DM: Last hemoglobin A1c 6.3% on 07/09/2023.  Monitor managed per PCP.  HLD: Last lipid profile in 07/09/2023 indicated total cholesterol 98, HDL 35, triglycerides 160 and LDL 36.  Continue atorvastatin  20 mg daily.   Disposition: F/u with Amarii Amy, NP 2 weeks post cath.   Signed, Nalayah Hitt D Lachele Lievanos, NP

## 2023-11-28 NOTE — H&P (View-Only) (Signed)
 Cardiology Office Note    Date:  11/29/2023  ID:  Jason Wyatt, DOB Oct 20, 1955, MRN [id number] PCP:  de [place], Raymond J, MD  Cardiologist:  Dorn Lesches, MD  Electrophysiologist:  None   Chief Complaint: Follow up for shortness of breath  History of Present Illness: .   Jason Wyatt is a 68 y.o. male with visit-pertinent history of PMH of hypertension, peripheral vascular disease, OSA on CPAP, type 2 diabetes, hyperlipidemia, RLS, gait instability, insomnia, shortness of breath, and obesity.  He underwent cardiac catheterization in 2013 which was normal.  His echocardiogram in 2020 showed mild LVH and diastolic dysfunction.   He is followed by Dr. Lesches.  He was seen by Dr. Jordan on 09/09/2021.  During that time he was being evaluated for shortness of breath.  He reported dyspnea and chest tightness with exertion.  His symptoms had been ongoing for more than a year.  He reported having COVID the prior year.  He reported that his symptoms had started before he contracted COVID.  He was unable to do housework without stopping to rest.  He had no lower extremity swelling.  He had lost weight.  He reported rare palpitations.  He was using his CPAP regularly.  He was having increased stress with his wife being on hospice.  A echocardiogram repeated at that time showed an LVEF of 60-65%, G1 DD, mild dilation of aortic root measuring 39 mm and ascending aortic dilation measuring 41 mm.  He had coronary CTA on 09/29/2021.  He was noted to have a coronary calcium  score of 55 which placed him in 38 percentile for age and sex matched control.  He was noted to have mid stenosis of his LAD 30-49% and medical management was recommended.   Patient was seen in clinic on 09/19/2022.  Patient reported that he was doing well at that time.  He denied any cardiac concerns or complaints.   Patient was seen clinic on 09/19/23, noted he was doing well overall. He did report some increased dyspnea on exertion, questioned  if it had worsened in recent months. He denied chest pain, tightness of pressure. Echocardiogram was ordered for further evaluation.  Echocardiogram on 10/23/2023 indicated LVEF 60 to 65%, no RWMA, severe asymmetric LVH of the basal septal segment, G1 DD, RV systolic function and size was normal, normal PASP, no evidence of mitral valve digitation or stenosis, mild calcification of the aortic valve, no aortic valve stenosis present, mild dilation of the aortic root measuring 41 mm.   Patient was last seen in clinic on 11/16/2023 for follow-up.  He denied any chest pain, noted some shoulder pain related to spine history with multiple interventions.  He continued to note some increased shortness of breath with prolonged exertion, denied any increased lower extremity edema, orthopnea or PND.  He denied any increased palpitations, presyncope or syncope.  Patient reported that he was using his CPAP nightly.  Given LVH noted on echo cardiac MRI was ordered for further evaluation.  Cardiac MRI on 11/20/2023 indicated severe asymmetric hypertrophy of the basal septum up to 1.9 cm, systolic function was normal with LVEF 57%, no RWMA, chordal SAM was present, consistent with hypertrophic cardiomyopathy.  RV ventricle was normal in cavity size with moderately reduced function, RVEF 35%, global hypokinesis with akinesis of the mid RV inferior wall, no significant dilation or aneurysms of the RV noted, septum flattened in systole/diastole consistent with RV volume/pressure overload, workup for pulmonary hypertension was recommended.  Today  he presents for follow-up and to review his cardiac MRI.  Patient denies any recent significant changes, continues to note dyspnea on exertion and increased fatigue with prolonged exertion.  He denies any significant chest pain, notes an occasional fleeting pressure-like sensation the chest that he feels is likely more so related to gas pain.  He denies any lower extremity edema, orthopnea  or PND.  He denies any palpitations, presyncope or syncope.  Patient reports CPAP adherence however on further discussion notes that he will regularly nap throughout the day and does not use his CPAP. ROS: .   Today he denies chest pain, shortness of breath, lower extremity edema, fatigue, palpitations, melena, hematuria, hemoptysis, diaphoresis, weakness, presyncope, syncope, orthopnea, and PND.  All other systems are reviewed and otherwise negative. Studies Reviewed: SABRA   EKG:  EKG is ordered today, personally reviewed, demonstrating  EKG Interpretation Date/Time:  Thursday November 29 2023 14:35:58 EST Ventricular Rate:  75 PR Interval:  226 QRS Duration:  138 QT Interval:  402 QTC Calculation: 448 R Axis:   -74  Text Interpretation: Sinus rhythm with 1st degree A-V block Left axis deviation Right bundle branch block Possible Lateral infarct (cited on or before 16-Aug-2020) Inferior infarct (cited on or before 03-Aug-2011) Confirmed by Lameshia Hypolite [phone]) on 11/29/2023 6:22:48 PM   CV Studies: Cardiac studies reviewed are outlined and summarized above. Otherwise please see EMR for full report. Cardiac Studies & Procedures   ______________________________________________________________________________________________     ECHOCARDIOGRAM  ECHOCARDIOGRAM COMPLETE 10/23/2023  Narrative ECHOCARDIOGRAM REPORT    Patient Name:   Jason Wyatt Date of Exam: 10/23/2023 Medical Rec #:  [id number]      Height:       74.0 in Accession #:    [id number]     Weight:       277.2 lb Date of Birth:  21-Oct-1955       BSA:          2.497 m Patient Age:    68 years       BP:           171/106 mmHg Patient Gender: M              HR:           65 bpm. Exam Location:  [address]  Procedure: 2D Echo, Cardiac Doppler, Color Doppler and Intracardiac Opacification Agent (Both Spectral and Color Flow Doppler were utilized during procedure).  Indications:    R06.09 SOB  History:        Patient has  prior history of Echocardiogram examinations, most recent 09/21/2021. PVD; Risk Factors:Hypertension, Diabetes, Sleep Apnea and HLD.  Sonographer:    Waldo Guadalajara RCS Referring Phys: [id number] Zedrick Springsteen D Wasim Hurlbut  IMPRESSIONS   1. Left ventricular ejection fraction, by estimation, is 60 to 65%. The left ventricle has normal function. The left ventricle has no regional wall motion abnormalities. There is severe asymmetric left ventricular hypertrophy of the basal-septal segment. Left ventricular diastolic parameters are consistent with Grade I diastolic dysfunction (impaired relaxation). 2. Right ventricular systolic function is normal. The right ventricular size is normal. There is normal pulmonary artery systolic pressure. 3. The mitral valve is normal in structure. No evidence of mitral valve regurgitation. No evidence of mitral stenosis. 4. The aortic valve is tricuspid. There is mild calcification of the aortic valve. Aortic valve regurgitation is not visualized. No aortic stenosis is present. 5. Aortic dilatation noted. There is mild dilatation of the aortic root, measuring  41 mm.  Comparison(s): No significant change from prior study. Prior images reviewed side by side.  FINDINGS Left Ventricle: Sigmoing septum 17 mm with no LVOT flow accleration or SAM or the mitral valve. Left ventricular ejection fraction, by estimation, is 60 to 65%. The left ventricle has normal function. The left ventricle has no regional wall motion abnormalities. The left ventricular internal cavity size was normal in size. There is severe asymmetric left ventricular hypertrophy of the basal-septal segment. Left ventricular diastolic parameters are consistent with Grade I diastolic dysfunction (impaired relaxation). Normal left ventricular filling pressure.  Right Ventricle: The right ventricular size is normal. No increase in right ventricular wall thickness. Right ventricular systolic function is normal. There is  normal pulmonary artery systolic pressure. The tricuspid regurgitant velocity is 2.31 m/s, and with an assumed right atrial pressure of 3 mmHg, the estimated right ventricular systolic pressure is 24.3 mmHg.  Left Atrium: Left atrial size was normal in size.  Right Atrium: Right atrial size was normal in size.  Pericardium: Trivial pericardial effusion is present. The pericardial effusion is circumferential.  Mitral Valve: The mitral valve is normal in structure. No evidence of mitral valve regurgitation. No evidence of mitral valve stenosis.  Tricuspid Valve: The tricuspid valve is normal in structure. Tricuspid valve regurgitation is mild . No evidence of tricuspid stenosis.  Aortic Valve: The aortic valve is tricuspid. There is mild calcification of the aortic valve. Aortic valve regurgitation is not visualized. No aortic stenosis is present.  Pulmonic Valve: The pulmonic valve was normal in structure. Pulmonic valve regurgitation is not visualized. No evidence of pulmonic stenosis.  Aorta: Aortic dilatation noted. There is mild dilatation of the aortic root, measuring 41 mm.  IAS/Shunts: The atrial septum is grossly normal.   LEFT VENTRICLE PLAX 2D LVIDd:         5.20 cm   Diastology LVIDs:         3.20 cm   LV e' medial:    6.09 cm/s LV PW:         1.00 cm   LV E/e' medial:  9.9 LV IVS:        1.10 cm   LV e' lateral:   6.20 cm/s LVOT diam:     2.40 cm   LV E/e' lateral: 9.7 LV SV:         86 LV SV Index:   35 LVOT Area:     4.52 cm LV IVRT:       109 msec   RIGHT VENTRICLE RV Basal diam:  3.00 cm RV S prime:     11.30 cm/s TAPSE (M-mode): 1.8 cm RVSP:           24.3 mmHg  LEFT ATRIUM             Index        RIGHT ATRIUM           Index LA diam:        3.80 cm 1.52 cm/m   RA Pressure: 3.00 mmHg LA Vol (A2C):   52.0 ml 20.83 ml/m  RA Area:     11.90 cm LA Vol (A4C):   48.0 ml 19.22 ml/m  RA Volume:   23.40 ml  9.37 ml/m LA Biplane Vol: 52.9 ml 21.19  ml/m AORTIC VALVE LVOT Vmax:   99.80 cm/s LVOT Vmean:  62.500 cm/s LVOT VTI:    0.191 m  AORTA Ao Root diam: 4.10 cm Ao Asc diam:  3.50 cm  MITRAL VALVE               TRICUSPID VALVE MV Area (PHT):             TR Peak grad:   21.3 mmHg MV Decel Time:             TR Vmax:        231.00 cm/s MV E velocity: 60.30 cm/s  Estimated RAP:  3.00 mmHg MV A velocity: 71.30 cm/s  RVSP:           24.3 mmHg MV E/A ratio:  0.85 SHUNTS Systemic VTI:  0.19 m Systemic Diam: 2.40 cm  Stanly Leavens MD Electronically signed by Stanly Leavens MD Signature Date/Time: 10/23/2023/12:12:54 PM    Final      CT SCANS  CT CORONARY MORPH W/CTA COR W/SCORE 09/29/2021  Addendum 09/29/2021  1:50 PM ADDENDUM REPORT: 09/29/2021 13:48  EXAM: OVER-READ INTERPRETATION  CT CHEST  The following report is an over-read performed by radiologist Dr. Lynwood Seip [facility] on 09/29/2021. This over-read does not include interpretation of cardiac or coronary anatomy or pathology. The coronary CTA interpretation by the cardiologist is attached.  COMPARISON:  August 03, 2011.  FINDINGS: 4 cm ascending thoracic aortic aneurysm is noted. No definite abnormality is seen involving the visualized mediastinum. No definite abnormality seen involving the visualized portion of the upper abdomen. Visualized pulmonary parenchyma is unremarkable. Visualized skeleton is unremarkable.  IMPRESSION: 4 cm ascending thoracic aortic aneurysm. Recommend annual imaging followup by CTA or MRA. This recommendation follows 2010 ACCF/AHA/AATS/ACR/ASA/SCA/SCAI/SIR/STS/SVM Guidelines for the Diagnosis and Management of Patients with Thoracic Aortic Disease. Circulation. 2010; 121: Z733-z630. Aortic aneurysm NOS (ICD10-I71.9). These results will be called to the ordering clinician or representative by the Radiologist Assistant, and communication documented in the PACS or zVision  Dashboard.   Electronically Signed By: Lynwood Seip Raddle M.D. On: 09/29/2021 13:48  Narrative CLINICAL DATA:  68 year old with chest pain.  EXAM: Cardiac/Coronary  CTA  TECHNIQUE: The patient was scanned on a [employer].  FINDINGS: A 120 kV prospective scan was triggered in the descending thoracic aorta at 111 HU's. Axial non-contrast 3 mm slices were carried out through the heart. The data set was analyzed on a dedicated work station and scored using the Agatson method. Gantry rotation speed was 250 msecs and collimation was .6 mm. 0.8 mg of sl NTG was given. The 3D data set was reconstructed in 5% intervals of the 67-82 % of the R-R cycle. Diastolic phases were analyzed on a dedicated work station using MPR, MIP and VRT modes. The patient received 80 cc of contrast.  Image quality: Good, mild misregistration artifact. Distal vessels are small in caliber and difficult to visualize.  Aorta:  Normal size.  No calcifications.  No dissection.  Aortic Valve: No calcifications.  Coronary Arteries:  Normal coronary origin.  Right dominance.  RCA is a large dominant artery that gives rise to PDA and PLA. There is no plaque.  Left main is a large artery that gives rise to LAD and Ramus/LCX arteries.  LAD is a large vessel that has focal mid calcified plaque. Mid mild stenosis 30-49%  LCX is a non-dominant artery that gives rise to one large OM1 branch. There is no plaque.  Other findings:  Normal pulmonary vein drainage into the left atrium.  Normal left atrial appendage without a thrombus.  Normal size of the pulmonary artery.  Please see radiology report for non cardiac  findings.  IMPRESSION: 1. Coronary calcium  score of 55. This was 34 percentile for age and sex matched control.  2. Normal coronary origin with right dominance.  3. LAD is a large vessel that has focal mid calcified plaque. Mid mild stenosis 30-49%.  CAD-RADS 2. Mild  non-obstructive CAD (25-49%). Consider non-atherosclerotic causes of chest pain. Consider preventive therapy and risk factor modification.  Electronically Signed: By: Oneil Parchment M.D. On: 09/29/2021 13:20   CARDIAC MRI  MR CARDIAC MORPHOLOGY W WO CONTRAST 11/20/2023  Narrative CLINICAL DATA:  Severe LVH  EXAM: MR CARDIA MORPHOLOGY WITHOUT AND WITH CONTRAST; MR CARDIAC VELOCITY FLOW MAPPING  TECHNIQUE: The patient was scanned on a 1.5 Tesla Siemens magnet. A dedicated cardiac coil was used. Functional imaging was done using TrueFisp sequences. 2,3, and 4 chamber views were done to assess for RWMA's. Modified Simpson's rule using a short axis stack was used to calculate an ejection fraction on a dedicated work [employer]. The patient received 15mL GADAVIST  GADOBUTROL  1 MMOL/ML IV SOLN. After 10 minutes inversion recovery sequences were used to assess for infiltration and scar tissue. Phase contrast velocity encoded images obtained x 2.  This examination is tailored for evaluation cardiac anatomy and function and provides very limited assessment of noncardiac structures, which are accordingly not evaluated during interpretation. If there is clinical concern for extracardiac pathology, further evaluation with CT imaging should be considered.  FINDINGS: LEFT VENTRICLE: Normal left ventricular size. Maximum septal wall thickness: 1.9 cm. Posterior wall thickness 1.4 cm. Left ventricular internal diameter (diastole): 3.7 cm. There are no regional wall motion abnormalities. Severe symmetric hypertrophy of the basal septum up 1.9 cm. Chordal SAM noted.  LV EF: 56% (Normal 49-79%)  Absolute volumes:  LV EDV: 97 mL (Normal 95-215 mL)  LV ESV: 42 mL (Normal 25-85 mL)  LV SV: 55 mL (Normal 61-145 mL)  CO: 3.9 L/min (Normal 3.4-7.8 L/min)  Indexed volumes:  LV EDV: 38 mL/sq-m (Normal 50-108 mL/sq-m)  LV ESV: 16 mL/sq-m (Normal 11-47 mL/sq-m)  LV SV:  21 mL/sq-m (Normal 33-72 mL/sq-m)  CI: 1.5 L/min/sq-m (Normal 1.8-4.2 L/min/sq-m)  RIGHT VENTRICLE: Normal right ventricular chamber size. Normal right ventricular wall thickness. Moderately reduced right ventricular systolic function. Global hypokinesis with akinesis of the mid RV inferior wall. No significant dilation or aneurysms of the RV noted. The septum is flattened in systole/diastole consistent with RV volume/pressure overload.  RV EF:  35% (Normal 51-80%)  Absolute volumes:  RV EDV: 127 mL (Normal 109-217 mL)  RV ESV: 83 mL (Normal 23-91 mL)  RV SV: 44 mL (Normal 71-141 mL)  CO: 3.1 L/min (Normal 2.8-8.8 L/min)  Indexed volumes:  RV EDV: 49 mL/sq-m (Normal 58-109 mL/sq-m)  RV ESV: 32 mL/sq-m (Normal 12-46 mL/sq-m)  RV SV: 17 mL/sq-m (Normal 38-71 mL/sq-m)  CI: 1.2 L/min/sq-m (Normal 1.7-4.2 L/min/sq-m)  Left atrium: Normal size.  Right atrium: Normal size.  Mitral valve: Normal mitral valve without regurgitation.  Aortic valve: Tricuspid aortic valve without regurgitation.  Tricuspid valve: Normal tricuspid valve without regurgitation.  Pulmonic valve: Normal pulmonary valve without regurgitation.  Aorta: The aortic root is normal. Mildly dilated ascending aorta up to 41 mm.  Pericardium: The pericardium is normal thickness. There is no pericardial effusion.  There is no evidence of intracardiac thrombus.  Systemic flow across the aortic valve (QS) was 3.7 L/min.  Pulmonary flow across the pulmonic valve (QP) was 4.6 L/min.  The QP:QS calculated across the aortic and pulmonic valves was 1.3.  Native myocardial T1-relaxation times were abnormal at 1071 ms.  Native myocardial T2-relaxation times were normal at 52 ms.  ECV was normal at 31%.  Delayed Enhancement: Small, focal area of LGE present at the posterior RV insertion site. This is non-CAD scar and can be seen with pulmonary hypertension. No evidence of myocardial  infarction, inflammation, or infiltrative disorder.  Extracardiac structures: No significant findings  IMPRESSION: 1. Severe asymmetric hypertrophy of the basal septum up to 1.9 cm. Systolic function was normal with LVEF 57%. No regional wall motion abnormalities. Chordal SAM is present. Findings are consistent with hypertrophic cardiomyopathy.  2. The right ventricle is normal in cavity size with moderately reduced function, RVEF 35%. Global hypokinesis with akinesis of the mid RV inferior wall. No significant dilation or aneurysms of the RV noted. The septum is flattened in systole/diastole consistent with RV volume/pressure overload. No significant tricuspid regurgitation to explain the RV dysfunction. Further work-up for pulmonary hypertension is recommended.  3. No significant valvular heart disease.  4. Small focal area of LGE present at the posterior RV insertion site. This is a non-specific finding and can be seen in pulmonary hypertension. No evidence of myocardial infarction, inflammation, or infiltrative disorder.  5. Mildly dilated ascending aorta up to 41 mm.   Electronically Signed By: Darryle Decent M.D. On: 11/21/2023 21:16   ______________________________________________________________________________________________       Current Reported Medications:.    Current Meds  Medication Sig   Accu-Chek Softclix Lancets lancets USE 1 LANCET IN THE MORNING , AT NOON AND AT BEDTIME   acyclovir  (ZOVIRAX ) 400 MG tablet TAKE 1 TABLET BY MOUTH DAILY (Patient taking differently: Take 400 mg by mouth daily. As needed)   allopurinol  (ZYLOPRIM ) 300 MG tablet TAKE 1 TABLET BY MOUTH  DAILY FOR GOUT PREVENTION   aspirin  81 MG tablet Take 81 mg by mouth daily.   atorvastatin  (LIPITOR) 20 MG tablet Take 1 tablet (20 mg total) by mouth daily.   Blood Glucose Monitoring Suppl (ACCU-CHEK GUIDE) w/Device KIT USE TO TEST IN THE MORNING , AT  NOON AND AT BEDTIME   Blood Glucose  Monitoring Suppl DEVI 1 each by Does not apply route in the morning, at noon, and at bedtime. May substitute to any manufacturer covered by patient's insurance.   carbidopa -levodopa  (SINEMET  IR) 25-250 MG tablet TAKE 1 TABLET BY MOUTH  TWICE DAILY FOR RESTLESS  LEGS   Cyanocobalamin (B-12) 2500 MCG SUBL Place under the tongue every other day. 4 times a week   dextromethorphan-guaiFENesin  (MUCINEX  DM) 30-600 MG 12hr tablet Take 1 tablet by mouth 2 (two) times daily as needed for cough.   DULoxetine  (CYMBALTA ) 60 MG capsule Take 1 capsule (60 mg total) by mouth at bedtime.   ferrous sulfate 325 (65 FE) MG tablet Take 325 mg by mouth daily with breakfast.   fexofenadine (ALLEGRA) 180 MG tablet Take 180 mg by mouth daily as needed.   fluticasone  (FLONASE ) 50 MCG/ACT nasal spray USE 2 SPRAYS IN BOTH NOSTRILS  ONCE DAILY   gabapentin  (NEURONTIN ) 600 MG tablet Take 1 tablet (600 mg total) by mouth 2 (two) times daily.   glucose blood (ACCU-CHEK GUIDE TEST) test strip 1 each by Other route daily. Use as instructed   glucose blood (ONETOUCH ULTRA) test strip USE TO CHECK BLOOD SUGAR  DAILY   Magnesium  400 MG CAPS Take 800 mg by mouth at bedtime.   Melatonin 10 MG TABS Take 10 mg by mouth at bedtime.   meloxicam  (MOBIC ) 7.5  MG tablet Take 1 tablet Daily  with Food  is needed for Pain   metFORMIN  (GLUCOPHAGE -XR) 500 MG 24 hr tablet TAKE 2 TABLETS BY MOUTH  TWICE DAILY WITH MEALS FOR  DIABETES   methocarbamol  (ROBAXIN ) 500 MG tablet Take 500 mg by mouth 3 (three) times daily as needed. PRN   Multiple Vitamin (MULTIVITAMIN) tablet Take 1 tablet by mouth daily.   mupirocin  ointment (BACTROBAN ) 2 % Apply 1 Application topically 2 (two) times daily.   Naphazoline-Pheniramine (EYE ALLERGY RELIEF OP) Apply to eye.   olmesartan  (BENICAR ) 40 MG tablet TAKE 1 TABLET BY MOUTH AT NIGHT  FOR BLOOD PRESSURE AND DIABETIC  KIDNEY PROTECTION   Omega 3 1200 MG CAPS Take 1 capsule by mouth daily.   pantoprazole  (PROTONIX ) 40  MG tablet TAKE 1 TABLET DAILY TO  PREVENT HEARTBURN &amp;  INDIGESTION   propranolol  (INDERAL ) 40 MG tablet TAKE 1 TABLET BY MOUTH DAILY   tamsulosin  (FLOMAX ) 0.4 MG CAPS capsule TAKE 2 CAPSULES BY MOUTH AT  BEDTIME FOR PROSTATE   TRULICITY  4.5 MG/0.5ML SOAJ Inject 1 pen  (4.5 mg) into Skin every 7 days   Vitamin D , Ergocalciferol , (DRISDOL ) 1.25 MG (50000 UNIT) CAPS capsule TAKE 1 CAPSULE BY MOUTH EVERY 7  DAYS   Physical Exam:    VS:  BP 130/78   Pulse 68   Ht 6' 2 (1.88 m)   Wt 288 lb (130.6 kg)   SpO2 99%   BMI 36.98 kg/m    Wt Readings from Last 3 Encounters:  11/29/23 288 lb (130.6 kg)  11/19/23 282 lb (127.9 kg)  11/16/23 283 lb (128.4 kg)    GEN: Well nourished, well developed in no acute distress NECK: No JVD; No carotid bruits CARDIAC: RRR, 2/6 systolic murmur best heard at left sternal border, no rubs or gallops RESPIRATORY:  Clear to auscultation without rales, wheezing or rhonchi  ABDOMEN: Soft, non-tender, non-distended EXTREMITIES:  No edema; No acute deformity     Asessement and Plan:.    RV systolic dysfunction/DOE/nonobstructive CAD: Coronary CTA 09/29/2021 indicated a coronary calcium  score of 55,normal coronary origin with right dominance, mid mild stenosis of the LAD at 30 to 49%. Cardiac MRI on 11/20/23 indicated RV ventricle was normal in cavity size with moderately reduced function, RVEF 35%, global hypokinesis with akinesis of the mid RV inferior wall, no significant dilation or aneurysms of the RV noted, septum flattened in systole/diastole consistent with RV volume/pressure overload, workup for pulmonary hypertension was recommended. Discussed with Dr. Barbaraann and Dr. Cherrie recommended proceeding with [facility]. Today patient reports that he continues to have dyspnea on exertion and has noted some increased fatigue.  He denies any significant chest pain, notes a very rare fleeting chest pressure.  He appears euvolemic however body habitus limits evaluation.   Patient agreeable to proceeding with right and left cardiac catheterization given progressive dyspnea on exertion and abnormal cardiac MRI results.  Please see consent below.  Check CBC, CMET and ProBNP. Reviewed ED precautions. Patient will hold Trulicity  Saturday and metformin  day of procedure. Patient has been scheduled for cardiac catheterization with Dr. Cherrie on 12/07/23. Continue aspirin  81 mg daily.  Informed Consent   Shared Decision Making/Informed Consent The risks [stroke (1 in 1000), death (1 in 1000), kidney failure [usually temporary] (1 in 500), bleeding (1 in 200), allergic reaction [possibly serious] (1 in 200)], benefits (diagnostic support and management of coronary artery disease) and alternatives of a cardiac catheterization were discussed in detail with Jason Wyatt  and he is willing to proceed.      LVH:  Cardiac MRI on 11/20/2023 indicated severe asymmetric hypertrophy of the basal septum up to 1.9 cm, systolic function was normal with LVEF 57%, no RWMA, chordal SAM was present, consistent with hypertrophic cardiomyopathy. Consider referral to HCM clinic following catheterization.  Continue olmesartan  and propranolol .  Severe OSA: Patient with long history of severe OSA.  Currently on CPAP followed by Dr. Chalice.  Patient reports adherence with CPAP however notes that he does not regularly nap throughout the day and does not use CPAP, adherence encouraged.  HTN: Blood pressure today 130/78.  Continue current antihypertensive regimen.  Type 2 DM: Last hemoglobin A1c 6.3% on 07/09/2023.  Monitor managed per PCP.  HLD: Last lipid profile in 07/09/2023 indicated total cholesterol 98, HDL 35, triglycerides 160 and LDL 36.  Continue atorvastatin  20 mg daily.   Disposition: F/u with Jason Thielen, NP 2 weeks post cath.   Signed, Jahfari Ambers D Stephanny Tsutsui, NP

## 2023-11-29 ENCOUNTER — Ambulatory Visit: Attending: Cardiology | Admitting: Cardiology

## 2023-11-29 ENCOUNTER — Encounter: Payer: Self-pay | Admitting: Cardiology

## 2023-11-29 VITALS — BP 130/78 | HR 68 | Ht 74.0 in | Wt 288.0 lb

## 2023-11-29 DIAGNOSIS — I2510 Atherosclerotic heart disease of native coronary artery without angina pectoris: Secondary | ICD-10-CM

## 2023-11-29 DIAGNOSIS — R0602 Shortness of breath: Secondary | ICD-10-CM | POA: Diagnosis not present

## 2023-11-29 DIAGNOSIS — I272 Pulmonary hypertension, unspecified: Secondary | ICD-10-CM

## 2023-11-29 DIAGNOSIS — I5189 Other ill-defined heart diseases: Secondary | ICD-10-CM

## 2023-11-29 DIAGNOSIS — E782 Mixed hyperlipidemia: Secondary | ICD-10-CM

## 2023-11-29 DIAGNOSIS — G4733 Obstructive sleep apnea (adult) (pediatric): Secondary | ICD-10-CM

## 2023-11-29 DIAGNOSIS — I10 Essential (primary) hypertension: Secondary | ICD-10-CM

## 2023-11-29 DIAGNOSIS — I517 Cardiomegaly: Secondary | ICD-10-CM

## 2023-11-29 NOTE — Patient Instructions (Signed)
 Medication Instructions:  Your physician recommends that you continue on your current medications as directed. Please refer to the Current Medication list given to you today.  *If you need a refill on your cardiac medications before your next appointment, please call your pharmacy*  Lab Work: Today- CMET, Pro BNP, CBC If you have labs (blood work) drawn today and your tests are completely normal, you will receive your results only by: MyChart Message (if you have MyChart) OR A paper copy in the mail If you have any lab test that is abnormal or we need to change your treatment, we will call you to review the results.  Testing/Procedures:   Follow-Up: At [facility], you and your health needs are our priority.  As part of our continuing mission to provide you with exceptional heart care, our providers are all part of one team.  This team includes your primary Cardiologist (physician) and Advanced Practice Providers or APPs (Physician Assistants and Nurse Practitioners) who all work together to provide you with the care you need, when you need it.  Your next appointment:   2 week(s) post Cath  Provider:   Katlyn West, NP               [facility] A DEPT OF [place]. [facility] [address] A DEPT OF THE [place]. [facility] [address] [place] [address] Dept: [phone] Loc: [phone]  Jason Wyatt  11/29/2023  You are scheduled for a Cardiac Catheterization on Friday, November 21 with Dr. Toribio Fuel.  1. Please arrive at the [facility] (Main Entrance A) at [facility]: [address] [place], [place] [address] at 5:30 AM (This time is 2 hour(s) before your procedure to ensure your preparation).   Free valet parking service is available. You will check in at ADMITTING. The support person will be asked to wait in the waiting room.  It is OK to have someone drop you off and come back when you are ready to be  discharged.    Special note: Every effort is made to have your procedure done on time. Please understand that emergencies sometimes delay scheduled procedures.  2. Diet: Nothing to eat after midnight.   3. Hydration: You need to be well hydrated before your procedure. On November 21 you may drink approved liquids (see below) until 2 hours before the procedure, with 16 oz of water  as your last intake.   List of approved liquids water , clear juice, clear tea, black coffee, fruit juices, non-citric and without pulp, carbonated beverages, Gatorade, Kool -Aid, plain Jello-O and plain ice popsicles.  4. Labs: You will need to have blood drawn on Thursday, November 13 at [facility] (1st Floor), [address] [place], [place] [address]. You do not need to be fasting.  5. Medication instructions in preparation for your procedure: Do not take metformin  the morning of the procedure and for 48 hours after procedure Do not take meloxicam  the day of the procedure   Contrast Allergy: No   On the morning of your procedure, take your Aspirin  81 mg and any morning medicines NOT listed above.  You may use sips of water .  6. Plan to go home the same day, you will only stay overnight if medically necessary. 7. Bring a current list of your medications and current insurance cards. 8. You MUST have a responsible person to drive  you home. 9. Someone MUST be with you the first 24 hours after you arrive home or your discharge will be delayed. 10. Please wear clothes that are easy to get on and off and wear slip-on shoes.  Thank you for allowing us  to care for you!   -- [facility]

## 2023-11-30 ENCOUNTER — Encounter: Payer: Self-pay | Admitting: Cardiology

## 2023-11-30 LAB — COMPREHENSIVE METABOLIC PANEL WITH GFR
ALT: 15 IU/L (ref 0–44)
AST: 17 IU/L (ref 0–40)
Albumin: 4.5 g/dL (ref 3.9–4.9)
Alkaline Phosphatase: 77 IU/L (ref 47–123)
BUN/Creatinine Ratio: 14 (ref 10–24)
BUN: 13 mg/dL (ref 8–27)
Bilirubin Total: 0.6 mg/dL (ref 0.0–1.2)
CO2: 22 mmol/L (ref 20–29)
Calcium: 10 mg/dL (ref 8.6–10.2)
Chloride: 100 mmol/L (ref 96–106)
Creatinine, Ser: 0.95 mg/dL (ref 0.76–1.27)
Globulin, Total: 2 g/dL (ref 1.5–4.5)
Glucose: 117 mg/dL — ABNORMAL HIGH (ref 70–99)
Potassium: 4.9 mmol/L (ref 3.5–5.2)
Sodium: 137 mmol/L (ref 134–144)
Total Protein: 6.5 g/dL (ref 6.0–8.5)
eGFR: 86 mL/min/1.73 (ref >59)

## 2023-11-30 LAB — CBC
Hematocrit: 39.4 % (ref 37.5–51.0)
Hemoglobin: 13.4 g/dL (ref 13.0–17.7)
MCH: 33.5 pg — ABNORMAL HIGH (ref 26.6–33.0)
MCHC: 34 g/dL (ref 31.5–35.7)
MCV: 99 fL — ABNORMAL HIGH (ref 79–97)
Platelets: 282 x10E3/uL (ref 150–450)
RBC: 4 x10E6/uL — ABNORMAL LOW (ref 4.14–5.80)
RDW: 13.4 % (ref 11.6–15.4)
WBC: 8.8 x10E3/uL (ref 3.4–10.8)

## 2023-11-30 NOTE — Progress Notes (Signed)
 Order(s) created erroneously. Erroneous order ID: [id number]  Order moved by: CHART CORRECTION ANALYST SEVEN, IDENTITY  Order move date/time: 11/30/2023 2:54 PM  Source Patient: [id number]  Source Contact: 11/29/2023  Destination Patient: [id number]  Destination Contact: 06/28/2022

## 2023-12-03 ENCOUNTER — Ambulatory Visit: Payer: Self-pay | Admitting: Cardiology

## 2023-12-04 ENCOUNTER — Telehealth: Payer: Self-pay | Admitting: *Deleted

## 2023-12-04 NOTE — Telephone Encounter (Addendum)
 Cardiac Catheterization scheduled at [facility] for: Friday December 07, 2023 7:30 AM Arrival time [facility] Main Entrance A at: 5:30 AM  Diet: -Nothing to eat after midnight.  Hydration: -May drink clear liquids until 2 hours before the procedure.  Approved liquids: Water , clear tea, black coffee, fruit juices-non-citric and without pulp,Gatorade, plain Jello/popsicles.   -Please drink 16 oz of water  2 hours before procedure.  Medication instructions: -Hold:  Metformin -day of procedure and 48 hours after -Other usual morning medications can be taken including aspirin  81 mg.  Patient tells me Trulicity  is weekly on Saturdays.  Plan to go home the same day, you will only stay overnight if medically necessary.  You must have responsible adult to drive you home.  Someone must be with you the first 24 hours after you arrive home.  Reviewed procedure instructions with patient.

## 2023-12-04 NOTE — Telephone Encounter (Signed)
 Patient read Katlyn West, NP message on MyChart 12/03/23.

## 2023-12-07 ENCOUNTER — Ambulatory Visit (HOSPITAL_COMMUNITY)
Admission: RE | Admit: 2023-12-07 | Discharge: 2023-12-07 | Disposition: A | Discharge status: Home / Self Care | Attending: Internal Medicine | Admitting: Internal Medicine

## 2023-12-07 ENCOUNTER — Encounter (HOSPITAL_COMMUNITY)
Admission: RE | Disposition: A | Discharge status: Home / Self Care | Payer: Self-pay | Source: Home / Self Care | Attending: Internal Medicine

## 2023-12-07 ENCOUNTER — Other Ambulatory Visit: Payer: Self-pay

## 2023-12-07 DIAGNOSIS — Z7982 Long term (current) use of aspirin: Secondary | ICD-10-CM | POA: Diagnosis not present

## 2023-12-07 DIAGNOSIS — E669 Obesity, unspecified: Secondary | ICD-10-CM | POA: Insufficient documentation

## 2023-12-07 DIAGNOSIS — I119 Hypertensive heart disease without heart failure: Secondary | ICD-10-CM | POA: Insufficient documentation

## 2023-12-07 DIAGNOSIS — G4733 Obstructive sleep apnea (adult) (pediatric): Secondary | ICD-10-CM | POA: Insufficient documentation

## 2023-12-07 DIAGNOSIS — E785 Hyperlipidemia, unspecified: Secondary | ICD-10-CM | POA: Insufficient documentation

## 2023-12-07 DIAGNOSIS — R0602 Shortness of breath: Secondary | ICD-10-CM

## 2023-12-07 DIAGNOSIS — R0609 Other forms of dyspnea: Secondary | ICD-10-CM | POA: Diagnosis not present

## 2023-12-07 DIAGNOSIS — Z6836 Body mass index (BMI) 36.0-36.9, adult: Secondary | ICD-10-CM | POA: Insufficient documentation

## 2023-12-07 DIAGNOSIS — Z79899 Other long term (current) drug therapy: Secondary | ICD-10-CM | POA: Diagnosis not present

## 2023-12-07 DIAGNOSIS — E1151 Type 2 diabetes mellitus with diabetic peripheral angiopathy without gangrene: Secondary | ICD-10-CM | POA: Diagnosis not present

## 2023-12-07 DIAGNOSIS — I2510 Atherosclerotic heart disease of native coronary artery without angina pectoris: Secondary | ICD-10-CM | POA: Insufficient documentation

## 2023-12-07 DIAGNOSIS — I5189 Other ill-defined heart diseases: Secondary | ICD-10-CM

## 2023-12-07 DIAGNOSIS — G2581 Restless legs syndrome: Secondary | ICD-10-CM | POA: Insufficient documentation

## 2023-12-07 DIAGNOSIS — Z7984 Long term (current) use of oral hypoglycemic drugs: Secondary | ICD-10-CM | POA: Diagnosis not present

## 2023-12-07 DIAGNOSIS — Z7985 Long-term (current) use of injectable non-insulin antidiabetic drugs: Secondary | ICD-10-CM | POA: Diagnosis not present

## 2023-12-07 DIAGNOSIS — I272 Pulmonary hypertension, unspecified: Secondary | ICD-10-CM

## 2023-12-07 HISTORY — PX: RIGHT/LEFT HEART CATH AND CORONARY ANGIOGRAPHY: CATH118266

## 2023-12-07 LAB — POCT I-STAT EG7
Acid-Base Excess: 0 mmol/L (ref 0.0–2.0)
Acid-Base Excess: 0 mmol/L (ref 0.0–2.0)
Acid-base deficit: 1 mmol/L (ref 0.0–2.0)
Bicarbonate: 24.6 mmol/L (ref 20.0–28.0)
Bicarbonate: 24.8 mmol/L (ref 20.0–28.0)
Bicarbonate: 25.8 mmol/L (ref 20.0–28.0)
Calcium, Ion: 1.21 mmol/L (ref 1.15–1.40)
Calcium, Ion: 1.26 mmol/L (ref 1.15–1.40)
Calcium, Ion: 1.29 mmol/L (ref 1.15–1.40)
HCT: 35 % — ABNORMAL LOW (ref 39.0–52.0)
HCT: 36 % — ABNORMAL LOW (ref 39.0–52.0)
HCT: 37 % — ABNORMAL LOW (ref 39.0–52.0)
Hemoglobin: 11.9 g/dL — ABNORMAL LOW (ref 13.0–17.0)
Hemoglobin: 12.2 g/dL — ABNORMAL LOW (ref 13.0–17.0)
Hemoglobin: 12.6 g/dL — ABNORMAL LOW (ref 13.0–17.0)
O2 Saturation: 69 %
O2 Saturation: 69 %
O2 Saturation: 73 %
Potassium: 4 mmol/L (ref 3.5–5.1)
Potassium: 4.1 mmol/L (ref 3.5–5.1)
Potassium: 4.2 mmol/L (ref 3.5–5.1)
Sodium: 138 mmol/L (ref 135–145)
Sodium: 138 mmol/L (ref 135–145)
Sodium: 138 mmol/L (ref 135–145)
TCO2: 26 mmol/L (ref 22–32)
TCO2: 26 mmol/L (ref 22–32)
TCO2: 27 mmol/L (ref 22–32)
pCO2, Ven: 40.5 mmHg — ABNORMAL LOW (ref 44–60)
pCO2, Ven: 42.7 mmHg — ABNORMAL LOW (ref 44–60)
pCO2, Ven: 44.1 mmHg (ref 44–60)
pH, Ven: 7.372 (ref 7.25–7.43)
pH, Ven: 7.375 (ref 7.25–7.43)
pH, Ven: 7.391 (ref 7.25–7.43)
pO2, Ven: 37 mmHg (ref 32–45)
pO2, Ven: 37 mmHg (ref 32–45)
pO2, Ven: 39 mmHg (ref 32–45)

## 2023-12-07 LAB — POCT I-STAT 7, (LYTES, BLD GAS, ICA,H+H)
Acid-Base Excess: 0 mmol/L (ref 0.0–2.0)
Bicarbonate: 24.1 mmol/L (ref 20.0–28.0)
Calcium, Ion: 1.2 mmol/L (ref 1.15–1.40)
HCT: 35 % — ABNORMAL LOW (ref 39.0–52.0)
Hemoglobin: 11.9 g/dL — ABNORMAL LOW (ref 13.0–17.0)
O2 Saturation: 93 %
Potassium: 4.1 mmol/L (ref 3.5–5.1)
Sodium: 140 mmol/L (ref 135–145)
TCO2: 25 mmol/L (ref 22–32)
pCO2 arterial: 37.4 mmHg (ref 32–48)
pH, Arterial: 7.418 (ref 7.35–7.45)
pO2, Arterial: 65 mmHg — ABNORMAL LOW (ref 83–108)

## 2023-12-07 LAB — GLUCOSE, CAPILLARY
Glucose-Capillary: 158 mg/dL — ABNORMAL HIGH (ref 70–99)
Glucose-Capillary: 122 mg/dL — ABNORMAL HIGH (ref 70–99)

## 2023-12-07 SURGERY — RIGHT/LEFT HEART CATH AND CORONARY ANGIOGRAPHY
Anesthesia: LOCAL

## 2023-12-07 MED ORDER — LIDOCAINE HCL (PF) 1 % IJ SOLN
INTRAMUSCULAR | Status: AC
  Filled 2023-12-07: qty 30

## 2023-12-07 MED ORDER — LIDOCAINE HCL (PF) 1 % IJ SOLN
INTRAMUSCULAR | Status: DC | PRN
Start: 2023-12-07 — End: 2023-12-07
  Administered 2023-12-07: 2 mL

## 2023-12-07 MED ORDER — ASPIRIN 81 MG PO CHEW: 81.0000 mg | CHEWABLE_TABLET | ORAL | Status: DC

## 2023-12-07 MED ORDER — HYDRALAZINE HCL 20 MG/ML IJ SOLN: 10.0000 mg | INTRAMUSCULAR | Status: DC | PRN

## 2023-12-07 MED ORDER — SODIUM CHLORIDE 0.9 % IV SOLN
250.0000 mL | INTRAVENOUS | Status: DC | PRN
Start: 2023-12-07 — End: 2023-12-07

## 2023-12-07 MED ORDER — MIDAZOLAM HCL 2 MG/2ML IJ SOLN
INTRAMUSCULAR | Status: AC
Start: 2023-12-07 — End: 2023-12-07
  Filled 2023-12-07: qty 2

## 2023-12-07 MED ORDER — SODIUM CHLORIDE 0.9% FLUSH: 3.0000 mL | INTRAVENOUS | Status: DC | PRN

## 2023-12-07 MED ORDER — VERAPAMIL HCL 2.5 MG/ML IV SOLN
INTRAVENOUS | Status: DC | PRN
  Administered 2023-12-07: 10 mL via INTRA_ARTERIAL

## 2023-12-07 MED ORDER — FREE WATER: 250.0000 mL | Freq: Once | Status: DC

## 2023-12-07 MED ORDER — FENTANYL CITRATE (PF) 100 MCG/2ML IJ SOLN
INTRAMUSCULAR | Status: AC
  Filled 2023-12-07: qty 2

## 2023-12-07 MED ORDER — FENTANYL CITRATE (PF) 100 MCG/2ML IJ SOLN
INTRAMUSCULAR | Status: DC | PRN
  Administered 2023-12-07: 25 ug via INTRAVENOUS

## 2023-12-07 MED ORDER — IOHEXOL 350 MG/ML SOLN
INTRAVENOUS | Status: DC | PRN
  Administered 2023-12-07: 70 mL

## 2023-12-07 MED ORDER — ONDANSETRON HCL 4 MG/2ML IJ SOLN: 4.0000 mg | Freq: Four times a day (QID) | INTRAMUSCULAR | Status: DC | PRN

## 2023-12-07 MED ORDER — LABETALOL HCL 5 MG/ML IV SOLN: 10.0000 mg | INTRAVENOUS | Status: DC | PRN

## 2023-12-07 MED ORDER — SODIUM CHLORIDE 0.9% FLUSH: 3.0000 mL | Freq: Two times a day (BID) | INTRAVENOUS | Status: DC

## 2023-12-07 MED ORDER — HEPARIN (PORCINE) IN NACL 1000-0.9 UT/500ML-% IV SOLN
INTRAVENOUS | Status: DC | PRN
Start: 2023-12-07 — End: 2023-12-07
  Administered 2023-12-07 (×2): 500 mL

## 2023-12-07 MED ORDER — VERAPAMIL HCL 2.5 MG/ML IV SOLN
INTRAVENOUS | Status: AC
Start: 2023-12-07 — End: 2023-12-07
  Filled 2023-12-07: qty 2

## 2023-12-07 MED ORDER — MIDAZOLAM HCL (PF) 2 MG/2ML IJ SOLN
INTRAMUSCULAR | Status: DC | PRN
  Administered 2023-12-07: 1 mg via INTRAVENOUS

## 2023-12-07 MED ORDER — ACETAMINOPHEN 325 MG PO TABS: 650.0000 mg | ORAL_TABLET | ORAL | Status: DC | PRN

## 2023-12-07 MED ORDER — SODIUM CHLORIDE 0.9 % IV SOLN: 250.0000 mL | INTRAVENOUS | Status: DC | PRN

## 2023-12-07 MED ORDER — HEPARIN SODIUM (PORCINE) 1000 UNIT/ML IJ SOLN
INTRAMUSCULAR | Status: AC
  Filled 2023-12-07: qty 10

## 2023-12-07 MED ORDER — SODIUM CHLORIDE 0.9% FLUSH
3.0000 mL | INTRAVENOUS | Status: DC | PRN
Start: 2023-12-07 — End: 2023-12-07

## 2023-12-07 SURGICAL SUPPLY — 11 items
CATH INFINITI 5 FR JL3.5 (CATHETERS) IMPLANT
CATH INFINITI JR4 5F (CATHETERS) IMPLANT
CATH SWAN GANZ 7F STRAIGHT (CATHETERS) IMPLANT
DEVICE RAD COMP TR BAND LRG (VASCULAR PRODUCTS) IMPLANT
GLIDESHEATH SLEND SS 6F .021 (SHEATH) IMPLANT
GLIDESHEATH SLENDER 7FR .021G (SHEATH) IMPLANT
GUIDEWIRE INQWIRE 1.5J.035X260 (WIRE) IMPLANT
KIT PV (KITS) IMPLANT
PACK CARDIAC CATHETERIZATION (CUSTOM PROCEDURE TRAY) ×1 IMPLANT
TRANSDUCER W/STOPCOCK (MISCELLANEOUS) IMPLANT
WIRE HI TORQ VERSACORE-J 145CM (WIRE) IMPLANT

## 2023-12-07 NOTE — Discharge Instructions (Addendum)

## 2023-12-07 NOTE — Progress Notes (Signed)
 Discharge instructions reviewed with patient and son at bedside. Denies questions concerns. PT tolerated PO intake. Ambulated in the hallway, was able to void without difficulty. Incision site remains clean dry and intact. No s/s of complications. PT escorted from the unit via wheel chair to personal vehicle.

## 2023-12-07 NOTE — Interval H&P Note (Signed)
 History and Physical Interval Note:  12/07/2023 8:16 AM  Jason Wyatt  has presented today for surgery, with the diagnosis of hp - sob - cp.  The various methods of treatment have been discussed with the patient and family. After consideration of risks, benefits and other options for treatment, the patient has consented to  Procedure(s): RIGHT/LEFT HEART CATH AND CORONARY ANGIOGRAPHY (N/A) as a surgical intervention.  The patient's history has been reviewed, patient examined, no change in status, stable for surgery.  I have reviewed the patient's chart and labs.  Questions were answered to the patient's satisfaction.     Makaylen Thieme

## 2023-12-08 ENCOUNTER — Encounter (HOSPITAL_COMMUNITY): Payer: Self-pay | Admitting: Internal Medicine

## 2023-12-17 LAB — HM DIABETES EYE EXAM

## 2023-12-18 ENCOUNTER — Ambulatory Visit: Admitting: Podiatry

## 2023-12-18 ENCOUNTER — Encounter: Payer: Self-pay | Admitting: Podiatry

## 2023-12-18 DIAGNOSIS — B351 Tinea unguium: Secondary | ICD-10-CM | POA: Diagnosis not present

## 2023-12-18 DIAGNOSIS — D2371 Other benign neoplasm of skin of right lower limb, including hip: Secondary | ICD-10-CM | POA: Diagnosis not present

## 2023-12-18 DIAGNOSIS — M79676 Pain in unspecified toe(s): Secondary | ICD-10-CM | POA: Diagnosis not present

## 2023-12-18 DIAGNOSIS — E1142 Type 2 diabetes mellitus with diabetic polyneuropathy: Secondary | ICD-10-CM | POA: Diagnosis not present

## 2023-12-18 DIAGNOSIS — D2372 Other benign neoplasm of skin of left lower limb, including hip: Secondary | ICD-10-CM | POA: Diagnosis not present

## 2023-12-18 NOTE — Progress Notes (Signed)
 Presents today chief complaint of painful elongated toenails and calluses bilaterally.  Objective: Vital signs stable alert oriented x 3.  Pulses are palpable.  Neuropathy is unchanged no open lesions or wounds.  Multiple thick calluses with fissures.  Toenails are long thick yellow dystrophic onychomycotic there are no open lesions or wounds.  Assessment: Pain in limb secondary to onychomycosis diabetic peripheral neuropathy and benign skin lesions with seizures.  Plan: Debridement of all benign skin lesions fissures and nails.  Follow-up with him in 3 months.

## 2023-12-20 NOTE — Progress Notes (Unsigned)
 Cardiology Office Note    Date:  12/22/2023  ID:  Jason Wyatt, Jason Wyatt [birth year], MRN [id number] PCP:  de [place], Raymond J, MD  Cardiologist:  Dorn Lesches, MD  Electrophysiologist:  None   Chief Complaint: Follow up for shortness of breath   History of Present Illness: .    Jason Wyatt is a 68 y.o. male with visit-pertinent history of PMH of hypertension, peripheral vascular disease, OSA on CPAP, type 2 diabetes, hyperlipidemia, RLS, gait instability, insomnia, shortness of breath, and obesity.  He underwent cardiac catheterization in 2013 which was normal.  His echocardiogram in 2020 showed mild LVH and diastolic dysfunction.   He is followed by Dr. Lesches.  He was seen by Dr. Jordan on 09/09/2021.  During that time he was being evaluated for shortness of breath.  He reported dyspnea and chest tightness with exertion.  His symptoms had been ongoing for more than a year.  He reported having COVID the prior year.  He reported that his symptoms had started before he contracted COVID.  He was unable to do housework without stopping to rest.  He had no lower extremity swelling.  He had lost weight.  He reported rare palpitations.  He was using his CPAP regularly.  He was having increased stress with his wife being on hospice.  A echocardiogram repeated at that time showed an LVEF of 60-65%, G1 DD, mild dilation of aortic root measuring 39 mm and ascending aortic dilation measuring 41 mm.  He had coronary CTA on 09/29/2021.  He was noted to have a coronary calcium  score of 55 which placed him in 38 percentile for age and sex matched control.  He was noted to have mid stenosis of his LAD 30-49% and medical management was recommended.   Patient was seen in clinic on 09/19/2022.  Patient reported that he was doing well at that time.  He denied any cardiac concerns or complaints.   Patient was seen clinic on 09/19/23, noted he was doing well overall. He did report some increased dyspnea on exertion,  questioned if it had worsened in recent months. He denied chest pain, tightness of pressure. Echocardiogram was ordered for further evaluation.  Echocardiogram on 10/23/2023 indicated LVEF 60 to 65%, no RWMA, severe asymmetric LVH of the basal septal segment, G1 DD, RV systolic function and size was normal, normal PASP, no evidence of mitral valve digitation or stenosis, mild calcification of the aortic valve, no aortic valve stenosis present, mild dilation of the aortic root measuring 41 mm.    Patient was last seen in clinic on 11/16/2023 for follow-up.  He denied any chest pain, noted some shoulder pain related to spine history with multiple interventions.  He continued to note some increased shortness of breath with prolonged exertion, denied any increased lower extremity edema, orthopnea or PND.  He denied any increased palpitations, presyncope or syncope.  Patient reported that he was using his CPAP nightly.  Given LVH noted on echo cardiac MRI was ordered for further evaluation.   Cardiac MRI on 11/20/2023 indicated severe asymmetric hypertrophy of the basal septum up to 1.9 cm, systolic function was normal with LVEF 57%, no RWMA, chordal SAM was present, consistent with hypertrophic cardiomyopathy.  RV ventricle was normal in cavity size with moderately reduced function, RVEF 35%, global hypokinesis with akinesis of the mid RV inferior wall, no significant dilation or aneurysms of the RV noted, septum flattened in systole/diastole consistent with RV volume/pressure overload, workup for pulmonary hypertension  was recommended.  R/LHC on 12/07/2023 indicated minimal nonobstructive CAD (LAD 30%, RCA 20%), normal LVEF of 66 5%, mild PAH with normal PVR and reduced PA PI suggestive of intrinsic RV dysfunction, mildly reduced CO by TD with recommendation for medical therapy.  Today he presents for follow-up.  He reports that he has been doing well overall.  He denies any chest pain, continues to note some  increased fatigue and dyspnea with prolonged exertion.  He denies any lower extremity edema, orthopnea or PND.  He notes that he just overall feels lazy and is no longer as active as he previously was.  He reports medication adherence, notes that he just took his medications just prior to his appointment.  He denies any presyncope, palpitations or syncope.  He reports CPAP adherence. ROS: .   Today he denies chest pain, lower extremity edema, palpitations, melena, hematuria, hemoptysis, diaphoresis, weakness, presyncope, syncope, orthopnea, and PND.  All other systems are reviewed and otherwise negative. Studies Reviewed: SABRA   EKG:  EKG is not ordered today.  CV Studies: Cardiac studies reviewed are outlined and summarized above. Otherwise please see EMR for full report. Cardiac Studies & Procedures   ______________________________________________________________________________________________ CARDIAC CATHETERIZATION  CARDIAC CATHETERIZATION 12/07/2023  Conclusion   Prox RCA lesion is 20% stenosed.   Prox LAD to Mid LAD lesion is 30% stenosed.   The left ventricular ejection fraction is 55-65% by visual estimate.  Findings:  Ao = 142/100 (109) LV = 145/17 RA =  9 RV = 28/10 PA = 29/20 (22) PCW = 15 Fick cardiac output/index = 7.5/3.0 TD CO/CI = 5.6/2.3 PVR = 0.93 (Fick) 1.25 (TD) Ao sat = 93% PA sat = 69%, 69% SVC sat = 73% PAPi = 1.0  Assessment: 1. Minimal non-obstructive CAD (LAD 30% RCA 20%) 2. Normal LV function EF 60-65% 3. Mild PAH with normal PVR and reduced PAPI suggestive of intrinsic RV dysfunction 4. Mildly reduced CO by TD (likely more accurate than Elza)  Plan/Discussion:  Medical therapy.  Toribio Fuel, MD 10:36 AM  Findings Coronary Findings Diagnostic  Dominance: Right  Left Main Vessel is angiographically normal.  Left Anterior Descending Prox LAD to Mid LAD lesion is 30% stenosed.  Left Circumflex Vessel is angiographically  normal.  Right Coronary Artery Prox RCA lesion is 20% stenosed.  Intervention  No interventions have been documented.     ECHOCARDIOGRAM  ECHOCARDIOGRAM COMPLETE 10/23/2023  Narrative ECHOCARDIOGRAM REPORT    Patient Name:   Jason Wyatt Date of Exam: 10/23/2023 Medical Rec #:  [id number]      Height:       74.0 in Accession #:    [id number]     Weight:       277.2 lb Date of Birth:  07/19/1955       BSA:          2.497 m Patient Age:    68 years       BP:           171/106 mmHg Patient Gender: M              HR:           65 bpm. Exam Location:  [address]  Procedure: 2D Echo, Cardiac Doppler, Color Doppler and Intracardiac Opacification Agent (Both Spectral and Color Flow Doppler were utilized during procedure).  Indications:    R06.09 SOB  History:        Patient has prior history of Echocardiogram examinations, most recent  09/21/2021. PVD; Risk Factors:Hypertension, Diabetes, Sleep Apnea and HLD.  Sonographer:    Waldo Guadalajara RCS Referring Phys: [id number] Brystol Wasilewski D Zema Lizardo  IMPRESSIONS   1. Left ventricular ejection fraction, by estimation, is 60 to 65%. The left ventricle has normal function. The left ventricle has no regional wall motion abnormalities. There is severe asymmetric left ventricular hypertrophy of the basal-septal segment. Left ventricular diastolic parameters are consistent with Grade I diastolic dysfunction (impaired relaxation). 2. Right ventricular systolic function is normal. The right ventricular size is normal. There is normal pulmonary artery systolic pressure. 3. The mitral valve is normal in structure. No evidence of mitral valve regurgitation. No evidence of mitral stenosis. 4. The aortic valve is tricuspid. There is mild calcification of the aortic valve. Aortic valve regurgitation is not visualized. No aortic stenosis is present. 5. Aortic dilatation noted. There is mild dilatation of the aortic root, measuring 41 mm.  Comparison(s): No  significant change from prior study. Prior images reviewed side by side.  FINDINGS Left Ventricle: Sigmoing septum 17 mm with no LVOT flow accleration or SAM or the mitral valve. Left ventricular ejection fraction, by estimation, is 60 to 65%. The left ventricle has normal function. The left ventricle has no regional wall motion abnormalities. The left ventricular internal cavity size was normal in size. There is severe asymmetric left ventricular hypertrophy of the basal-septal segment. Left ventricular diastolic parameters are consistent with Grade I diastolic dysfunction (impaired relaxation). Normal left ventricular filling pressure.  Right Ventricle: The right ventricular size is normal. No increase in right ventricular wall thickness. Right ventricular systolic function is normal. There is normal pulmonary artery systolic pressure. The tricuspid regurgitant velocity is 2.31 m/s, and with an assumed right atrial pressure of 3 mmHg, the estimated right ventricular systolic pressure is 24.3 mmHg.  Left Atrium: Left atrial size was normal in size.  Right Atrium: Right atrial size was normal in size.  Pericardium: Trivial pericardial effusion is present. The pericardial effusion is circumferential.  Mitral Valve: The mitral valve is normal in structure. No evidence of mitral valve regurgitation. No evidence of mitral valve stenosis.  Tricuspid Valve: The tricuspid valve is normal in structure. Tricuspid valve regurgitation is mild . No evidence of tricuspid stenosis.  Aortic Valve: The aortic valve is tricuspid. There is mild calcification of the aortic valve. Aortic valve regurgitation is not visualized. No aortic stenosis is present.  Pulmonic Valve: The pulmonic valve was normal in structure. Pulmonic valve regurgitation is not visualized. No evidence of pulmonic stenosis.  Aorta: Aortic dilatation noted. There is mild dilatation of the aortic root, measuring 41 mm.  IAS/Shunts: The  atrial septum is grossly normal.   LEFT VENTRICLE PLAX 2D LVIDd:         5.20 cm   Diastology LVIDs:         3.20 cm   LV e' medial:    6.09 cm/s LV PW:         1.00 cm   LV E/e' medial:  9.9 LV IVS:        1.10 cm   LV e' lateral:   6.20 cm/s LVOT diam:     2.40 cm   LV E/e' lateral: 9.7 LV SV:         86 LV SV Index:   35 LVOT Area:     4.52 cm LV IVRT:       109 msec   RIGHT VENTRICLE RV Basal diam:  3.00 cm RV S prime:  11.30 cm/s TAPSE (M-mode): 1.8 cm RVSP:           24.3 mmHg  LEFT ATRIUM             Index        RIGHT ATRIUM           Index LA diam:        3.80 cm 1.52 cm/m   RA Pressure: 3.00 mmHg LA Vol (A2C):   52.0 ml 20.83 ml/m  RA Area:     11.90 cm LA Vol (A4C):   48.0 ml 19.22 ml/m  RA Volume:   23.40 ml  9.37 ml/m LA Biplane Vol: 52.9 ml 21.19 ml/m AORTIC VALVE LVOT Vmax:   99.80 cm/s LVOT Vmean:  62.500 cm/s LVOT VTI:    0.191 m  AORTA Ao Root diam: 4.10 cm Ao Asc diam:  3.50 cm  MITRAL VALVE               TRICUSPID VALVE MV Area (PHT):             TR Peak grad:   21.3 mmHg MV Decel Time:             TR Vmax:        231.00 cm/s MV E velocity: 60.30 cm/s  Estimated RAP:  3.00 mmHg MV A velocity: 71.30 cm/s  RVSP:           24.3 mmHg MV E/A ratio:  0.85 SHUNTS Systemic VTI:  0.19 m Systemic Diam: 2.40 cm  Stanly Leavens MD Electronically signed by Stanly Leavens MD Signature Date/Time: 10/23/2023/12:12:54 PM    Final      CT SCANS  CT CORONARY MORPH W/CTA COR W/SCORE 09/29/2021  Addendum 09/29/2021  1:50 PM ADDENDUM REPORT: 09/29/2021 13:48  EXAM: OVER-READ INTERPRETATION  CT CHEST  The following report is an over-read performed by radiologist Dr. Lynwood Seip [facility] on 09/29/2021. This over-read does not include interpretation of cardiac or coronary anatomy or pathology. The coronary CTA interpretation by the cardiologist is attached.  COMPARISON:  August 03, 2011.  FINDINGS: 4 cm  ascending thoracic aortic aneurysm is noted. No definite abnormality is seen involving the visualized mediastinum. No definite abnormality seen involving the visualized portion of the upper abdomen. Visualized pulmonary parenchyma is unremarkable. Visualized skeleton is unremarkable.  IMPRESSION: 4 cm ascending thoracic aortic aneurysm. Recommend annual imaging followup by CTA or MRA. This recommendation follows 2010 ACCF/AHA/AATS/ACR/ASA/SCA/SCAI/SIR/STS/SVM Guidelines for the Diagnosis and Management of Patients with Thoracic Aortic Disease. Circulation. 2010; 121: Z733-z630. Aortic aneurysm NOS (ICD10-I71.9). These results will be called to the ordering clinician or representative by the Radiologist Assistant, and communication documented in the PACS or zVision Dashboard.   Electronically Signed By: Lynwood Seip Raddle M.D. On: 09/29/2021 13:48  Narrative CLINICAL DATA:  68 year old with chest pain.  EXAM: Cardiac/Coronary  CTA  TECHNIQUE: The patient was scanned on a [employer].  FINDINGS: A 120 kV prospective scan was triggered in the descending thoracic aorta at 111 HU's. Axial non-contrast 3 mm slices were carried out through the heart. The data set was analyzed on a dedicated work station and scored using the Agatson method. Gantry rotation speed was 250 msecs and collimation was .6 mm. 0.8 mg of sl NTG was given. The 3D data set was reconstructed in 5% intervals of the 67-82 % of the R-R cycle. Diastolic phases were analyzed on a dedicated work station using MPR, MIP and VRT modes.  The patient received 80 cc of contrast.  Image quality: Good, mild misregistration artifact. Distal vessels are small in caliber and difficult to visualize.  Aorta:  Normal size.  No calcifications.  No dissection.  Aortic Valve: No calcifications.  Coronary Arteries:  Normal coronary origin.  Right dominance.  RCA is a large dominant artery that gives rise to PDA and PLA.  There is no plaque.  Left main is a large artery that gives rise to LAD and Ramus/LCX arteries.  LAD is a large vessel that has focal mid calcified plaque. Mid mild stenosis 30-49%  LCX is a non-dominant artery that gives rise to one large OM1 branch. There is no plaque.  Other findings:  Normal pulmonary vein drainage into the left atrium.  Normal left atrial appendage without a thrombus.  Normal size of the pulmonary artery.  Please see radiology report for non cardiac findings.  IMPRESSION: 1. Coronary calcium  score of 55. This was 108 percentile for age and sex matched control.  2. Normal coronary origin with right dominance.  3. LAD is a large vessel that has focal mid calcified plaque. Mid mild stenosis 30-49%.  CAD-RADS 2. Mild non-obstructive CAD (25-49%). Consider non-atherosclerotic causes of chest pain. Consider preventive therapy and risk factor modification.  Electronically Signed: By: Oneil Parchment M.D. On: 09/29/2021 13:20   CARDIAC MRI  MR CARDIAC MORPHOLOGY W WO CONTRAST 11/20/2023  Narrative CLINICAL DATA:  Severe LVH  EXAM: MR CARDIA MORPHOLOGY WITHOUT AND WITH CONTRAST; MR CARDIAC VELOCITY FLOW MAPPING  TECHNIQUE: The patient was scanned on a 1.5 Tesla Siemens magnet. A dedicated cardiac coil was used. Functional imaging was done using TrueFisp sequences. 2,3, and 4 chamber views were done to assess for RWMA's. Modified Simpson's rule using a short axis stack was used to calculate an ejection fraction on a dedicated work [employer]. The patient received 15mL GADAVIST  GADOBUTROL  1 MMOL/ML IV SOLN. After 10 minutes inversion recovery sequences were used to assess for infiltration and scar tissue. Phase contrast velocity encoded images obtained x 2.  This examination is tailored for evaluation cardiac anatomy and function and provides very limited assessment of noncardiac structures, which are accordingly not evaluated  during interpretation. If there is clinical concern for extracardiac pathology, further evaluation with CT imaging should be considered.  FINDINGS: LEFT VENTRICLE: Normal left ventricular size. Maximum septal wall thickness: 1.9 cm. Posterior wall thickness 1.4 cm. Left ventricular internal diameter (diastole): 3.7 cm. There are no regional wall motion abnormalities. Severe symmetric hypertrophy of the basal septum up 1.9 cm. Chordal SAM noted.  LV EF: 56% (Normal 49-79%)  Absolute volumes:  LV EDV: 97 mL (Normal 95-215 mL)  LV ESV: 42 mL (Normal 25-85 mL)  LV SV: 55 mL (Normal 61-145 mL)  CO: 3.9 L/min (Normal 3.4-7.8 L/min)  Indexed volumes:  LV EDV: 38 mL/sq-m (Normal 50-108 mL/sq-m)  LV ESV: 16 mL/sq-m (Normal 11-47 mL/sq-m)  LV SV: 21 mL/sq-m (Normal 33-72 mL/sq-m)  CI: 1.5 L/min/sq-m (Normal 1.8-4.2 L/min/sq-m)  RIGHT VENTRICLE: Normal right ventricular chamber size. Normal right ventricular wall thickness. Moderately reduced right ventricular systolic function. Global hypokinesis with akinesis of the mid RV inferior wall. No significant dilation or aneurysms of the RV noted. The septum is flattened in systole/diastole consistent with RV volume/pressure overload.  RV EF:  35% (Normal 51-80%)  Absolute volumes:  RV EDV: 127 mL (Normal 109-217 mL)  RV ESV: 83 mL (Normal 23-91 mL)  RV SV: 44 mL (Normal 71-141 mL)  CO: 3.1 L/min (Normal 2.8-8.8 L/min)  Indexed volumes:  RV EDV: 49 mL/sq-m (Normal 58-109 mL/sq-m)  RV ESV: 32 mL/sq-m (Normal 12-46 mL/sq-m)  RV SV: 17 mL/sq-m (Normal 38-71 mL/sq-m)  CI: 1.2 L/min/sq-m (Normal 1.7-4.2 L/min/sq-m)  Left atrium: Normal size.  Right atrium: Normal size.  Mitral valve: Normal mitral valve without regurgitation.  Aortic valve: Tricuspid aortic valve without regurgitation.  Tricuspid valve: Normal tricuspid valve without regurgitation.  Pulmonic valve: Normal pulmonary valve without  regurgitation.  Aorta: The aortic root is normal. Mildly dilated ascending aorta up to 41 mm.  Pericardium: The pericardium is normal thickness. There is no pericardial effusion.  There is no evidence of intracardiac thrombus.  Systemic flow across the aortic valve (QS) was 3.7 L/min.  Pulmonary flow across the pulmonic valve (QP) was 4.6 L/min.  The QP:QS calculated across the aortic and pulmonic valves was 1.3.  Native myocardial T1-relaxation times were abnormal at 1071 ms.  Native myocardial T2-relaxation times were normal at 52 ms.  ECV was normal at 31%.  Delayed Enhancement: Small, focal area of LGE present at the posterior RV insertion site. This is non-CAD scar and can be seen with pulmonary hypertension. No evidence of myocardial infarction, inflammation, or infiltrative disorder.  Extracardiac structures: No significant findings  IMPRESSION: 1. Severe asymmetric hypertrophy of the basal septum up to 1.9 cm. Systolic function was normal with LVEF 57%. No regional wall motion abnormalities. Chordal SAM is present. Findings are consistent with hypertrophic cardiomyopathy.  2. The right ventricle is normal in cavity size with moderately reduced function, RVEF 35%. Global hypokinesis with akinesis of the mid RV inferior wall. No significant dilation or aneurysms of the RV noted. The septum is flattened in systole/diastole consistent with RV volume/pressure overload. No significant tricuspid regurgitation to explain the RV dysfunction. Further work-up for pulmonary hypertension is recommended.  3. No significant valvular heart disease.  4. Small focal area of LGE present at the posterior RV insertion site. This is a non-specific finding and can be seen in pulmonary hypertension. No evidence of myocardial infarction, inflammation, or infiltrative disorder.  5. Mildly dilated ascending aorta up to 41 mm.   Electronically Signed By: Darryle Decent M.D. On:  11/21/2023 21:16   ______________________________________________________________________________________________       Current Reported Medications:.    Current Meds  Medication Sig   Accu-Chek Softclix Lancets lancets USE 1 LANCET IN THE MORNING , AT NOON AND AT BEDTIME   acyclovir  (ZOVIRAX ) 400 MG tablet TAKE 1 TABLET BY MOUTH DAILY (Patient taking differently: Take 400 mg by mouth daily as needed (Cold sore).)   allopurinol  (ZYLOPRIM ) 300 MG tablet TAKE 1 TABLET BY MOUTH  DAILY FOR GOUT PREVENTION   aspirin  81 MG tablet Take 81 mg by mouth in the morning.   atorvastatin  (LIPITOR) 20 MG tablet Take 1 tablet (20 mg total) by mouth daily. (Patient taking differently: Take 20 mg by mouth at bedtime.)   Blood Glucose Monitoring Suppl (ACCU-CHEK GUIDE) w/Device KIT USE TO TEST IN THE MORNING , AT  NOON AND AT BEDTIME   Blood Glucose Monitoring Suppl DEVI 1 each by Does not apply route in the morning, at noon, and at bedtime. May substitute to any manufacturer covered by patient's insurance.   carbidopa -levodopa  (SINEMET  IR) 25-250 MG tablet TAKE 1 TABLET BY MOUTH  TWICE DAILY FOR RESTLESS  LEGS   dextromethorphan-guaiFENesin  (MUCINEX  DM) 30-600 MG 12hr tablet Take 1 tablet by mouth 2 (two) times daily as needed (Runny nose).  DULoxetine  (CYMBALTA ) 60 MG capsule Take 1 capsule (60 mg total) by mouth at bedtime.   ferrous sulfate 325 (65 FE) MG tablet Take 325 mg by mouth daily with breakfast.   fexofenadine (ALLEGRA) 180 MG tablet Take 180 mg by mouth daily as needed.   fluticasone  (FLONASE ) 50 MCG/ACT nasal spray USE 2 SPRAYS IN BOTH NOSTRILS  ONCE DAILY (Patient taking differently: Place 1 spray into both nostrils daily as needed for allergies or rhinitis.)   gabapentin  (NEURONTIN ) 600 MG tablet Take 1 tablet (600 mg total) by mouth 2 (two) times daily.   glucose blood (ACCU-CHEK GUIDE TEST) test strip 1 each by Other route daily. Use as instructed   glucose blood (ONETOUCH ULTRA) test strip  USE TO CHECK BLOOD SUGAR  DAILY   Magnesium  400 MG CAPS Take 800 mg by mouth at bedtime.   Melatonin 10 MG TABS Take 10 mg by mouth at bedtime. (Patient taking differently: Take 10 mg by mouth daily as needed (Sleep).)   meloxicam  (MOBIC ) 7.5 MG tablet Take 1 tablet Daily  with Food  is needed for Pain   metFORMIN  (GLUCOPHAGE -XR) 500 MG 24 hr tablet TAKE 2 TABLETS BY MOUTH  TWICE DAILY WITH MEALS FOR  DIABETES   methocarbamol  (ROBAXIN ) 500 MG tablet Take 500 mg by mouth 3 (three) times daily as needed. PRN   Multiple Vitamin (MULTIVITAMIN) tablet Take 1 tablet by mouth daily. Men   mupirocin  ointment (BACTROBAN ) 2 % Apply 1 Application topically 2 (two) times daily. (Patient taking differently: Apply 1 Application topically 2 (two) times daily as needed (Rash).)   Naphazoline-Pheniramine (EYE ALLERGY RELIEF OP) Apply to eye.   olmesartan  (BENICAR ) 40 MG tablet TAKE 1 TABLET BY MOUTH AT NIGHT  FOR BLOOD PRESSURE AND DIABETIC  KIDNEY PROTECTION   Omega 3 1200 MG CAPS Take 1,200 mg by mouth at bedtime.   pantoprazole  (PROTONIX ) 40 MG tablet TAKE 1 TABLET DAILY TO  PREVENT HEARTBURN &amp;  INDIGESTION   propranolol  (INDERAL ) 40 MG tablet TAKE 1 TABLET BY MOUTH DAILY   tamsulosin  (FLOMAX ) 0.4 MG CAPS capsule TAKE 2 CAPSULES BY MOUTH AT  BEDTIME FOR PROSTATE (Patient taking differently: Take 0.4 mg by mouth at bedtime.  BEDTIME FOR PROSTATE)   TRULICITY  4.5 MG/0.5ML SOAJ Inject 1 pen  (4.5 mg) into Skin every 7 days   Vitamin D , Ergocalciferol , (DRISDOL ) 1.25 MG (50000 UNIT) CAPS capsule TAKE 1 CAPSULE BY MOUTH EVERY 7  DAYS   Physical Exam:    VS:  BP 138/80 (BP Location: Left Arm, Patient Position: Sitting, Cuff Size: Large)   Pulse 71   Ht 6' 2 (1.88 m)   Wt 289 lb (131.1 kg)   BMI 37.11 kg/m    Wt Readings from Last 3 Encounters:  12/21/23 289 lb (131.1 kg)  12/07/23 272 lb (123.4 kg)  11/29/23 288 lb (130.6 kg)    GEN: Well nourished, well developed in no acute distress NECK: No JVD;  No carotid bruits CARDIAC: RRR, no murmurs, rubs, gallops RESPIRATORY:  Clear to auscultation without rales, wheezing or rhonchi  ABDOMEN: Soft, non-tender, non-distended EXTREMITIES:  No edema; No acute deformity     Asessement and Plan:.    RV systolic dysfunction/DOE/nonobstructive CAD: Coronary CTA 09/29/2021 indicated a coronary calcium  score of 55,normal coronary origin with right dominance, mid mild stenosis of the LAD at 30 to 49%. Cardiac MRI on 11/20/23 indicated RV ventricle was normal in cavity size with moderately reduced function, RVEF 35%, global hypokinesis with akinesis  of the mid RV inferior wall, no significant dilation or aneurysms of the RV noted, septum flattened in systole/diastole consistent with RV volume/pressure overload, workup for pulmonary hypertension was recommended.  R/LHC on 12/07/2023 indicated minimal nonobstructive CAD (LAD 30%, RCA 20%), normal LVEF of 66 5%, mild PAH with normal PVR and reduced PA PI suggestive of intrinsic RV dysfunction, mildly reduced CO by TD with recommendation for medical therapy.  Today he reports that he is doing well, continues to note dyspnea on exertion.  He denies any weight gain at home, denies any increased lower extremity edema, abdominal distention, orthopnea or PND.  Patient appears euvolemic and well compensated, limited by body habitus.  Discussed with Dr. Barbaraann DOD at [facility], will refer patient to advanced heart failure clinic.  Check basic metabolic profile and proBNP today.  Continue aspirin  81 mg daily, Lipitor 20 mg daily, olmesartan  40 mg daily and propranolol  40 mg daily.  LVH:  Cardiac MRI on 11/20/2023 indicated severe asymmetric hypertrophy of the basal septum up to 1.9 cm, systolic function was normal with LVEF 57%, no RWMA, chordal SAM was present, consistent with hypertrophic cardiomyopathy. Paitent referred to advanced HF clinic given RV dysfunction.   Severe OSA: Patient with long history of severe OSA.   Currently on CPAP followed by Dr. Chalice. Patient reports that he has been compliant at night with CPAP, encouraged use with daytime napping.   Hypertension: Blood pressure today 138/80. Patient notes he took his medications just before appointment. Continue current antihypertensive regimen.   Type II DM: Last hemoglobin A1c 6.3% on 07/09/2023.  Monitored and managed per PCP.  Hyperlipidemia: Last lipid profile in 07/09/2023 indicated total cholesterol 98, HDL 35, triglycerides 160 and LDL 36. Continue atorvastatin  20 mg daily.    Disposition: F/u with Advanced HF clinic, Dr. Court in 02/2024.   Signed, Nasiir Monts D Jacqeline Broers, NP

## 2023-12-21 ENCOUNTER — Encounter: Payer: Self-pay | Admitting: Cardiology

## 2023-12-21 ENCOUNTER — Ambulatory Visit: Attending: Cardiology | Admitting: Cardiology

## 2023-12-21 VITALS — BP 138/80 | HR 71 | Ht 74.0 in | Wt 289.0 lb

## 2023-12-21 DIAGNOSIS — G4733 Obstructive sleep apnea (adult) (pediatric): Secondary | ICD-10-CM

## 2023-12-21 DIAGNOSIS — I2510 Atherosclerotic heart disease of native coronary artery without angina pectoris: Secondary | ICD-10-CM | POA: Diagnosis not present

## 2023-12-21 DIAGNOSIS — I5189 Other ill-defined heart diseases: Secondary | ICD-10-CM

## 2023-12-21 DIAGNOSIS — R0602 Shortness of breath: Secondary | ICD-10-CM

## 2023-12-21 DIAGNOSIS — I272 Pulmonary hypertension, unspecified: Secondary | ICD-10-CM | POA: Diagnosis not present

## 2023-12-21 DIAGNOSIS — I517 Cardiomegaly: Secondary | ICD-10-CM

## 2023-12-21 NOTE — Patient Instructions (Addendum)
 Medication Instructions:  NO CHANGES  Lab Work: BMET AND PRO BNP TO BE DONE TODAY.  Testing/Procedures: NONE  Follow-Up: At [facility], you and your health needs are our priority.  As part of our continuing mission to provide you with exceptional heart care, our providers are all part of one team.  This team includes your primary Cardiologist (physician) and Advanced Practice Providers or APPs (Physician Assistants and Nurse Practitioners) who all work together to provide you with the care you need, when you need it.  Your next appointment:   KEEP FEBRUARY APPOINTMENT WITH DR. COURT. (02/18/24 at 8:00 AM)  Provider:   Dorn Court, MD

## 2023-12-22 ENCOUNTER — Encounter: Payer: Self-pay | Admitting: Cardiology

## 2023-12-22 LAB — BASIC METABOLIC PANEL WITH GFR
BUN/Creatinine Ratio: 16 (ref 10–24)
BUN: 15 mg/dL (ref 8–27)
CO2: 24 mmol/L (ref 20–29)
Calcium: 9.9 mg/dL (ref 8.6–10.2)
Chloride: 101 mmol/L (ref 96–106)
Creatinine, Ser: 0.96 mg/dL (ref 0.76–1.27)
Glucose: 113 mg/dL — ABNORMAL HIGH (ref 70–99)
Potassium: 5.2 mmol/L (ref 3.5–5.2)
Sodium: 140 mmol/L (ref 134–144)
eGFR: 85 mL/min/1.73 (ref >59)

## 2023-12-22 LAB — PRO B NATRIURETIC PEPTIDE: NT-Pro BNP: <36 pg/mL (ref 0–376)

## 2023-12-25 ENCOUNTER — Ambulatory Visit: Payer: Self-pay | Admitting: Cardiology

## 2023-12-26 NOTE — Telephone Encounter (Signed)
 Left message for patient to call office for results.

## 2023-12-27 NOTE — Telephone Encounter (Signed)
Pt returning call for results. Please advise.  

## 2023-12-28 ENCOUNTER — Ambulatory Visit: Admitting: Cardiology

## 2023-12-31 ENCOUNTER — Ambulatory Visit (HOSPITAL_COMMUNITY): Admission: RE | Admit: 2023-12-31 | Discharge: 2023-12-31 | Attending: Cardiology

## 2023-12-31 ENCOUNTER — Ambulatory Visit: Payer: Self-pay

## 2023-12-31 DIAGNOSIS — R0602 Shortness of breath: Secondary | ICD-10-CM

## 2023-12-31 MED ORDER — IOHEXOL 350 MG/ML SOLN
75.0000 mL | Freq: Once | INTRAVENOUS | Status: AC | PRN
  Administered 2023-12-31: 08:00:00 75 mL via INTRAVENOUS

## 2023-12-31 NOTE — Telephone Encounter (Signed)
°   FYI Only or Action Required?: FYI only for provider: appointment scheduled on 01/01/2024.  Patient was last seen in primary care on 11/19/2023 by de Cuba, Quintin PARAS, MD.  Called Nurse Triage reporting Sinusitis.  Symptoms began a week ago.  Interventions attempted: OTC medications: allegra, nasal spray.  Symptoms are: gradually worsening.  Triage Disposition: See PCP When Office is Open (Within 3 Days)  Patient/caregiver understands and will follow disposition?: Yes       Copied from CRM #[id number]. Topic: Clinical - Red Word Triage >> Dec 31, 2023  8:20 AM Fonda T wrote: [facility] that prompted transfer to Nurse Triage: Pt states he has worsening, and increased nasal and ear congestion, with sinus pain and pressure. States he thinks he has a sinus infection.    Pt requesting medication suggestion he can take for infection.    Ph. [phone] >> Dec 31, 2023  8:43 AM Treva T wrote: Pt holding for NT, for sinus pain and pressure, nurse answered, but could not hear anyone on the line.    Pt unable to hold any longer, requesting a return call to Ph. [phone].   Reason for Disposition  [1] Sinus congestion (pressure, fullness) AND [2] present > 10 days  Answer Assessment - Initial Assessment Questions 1. LOCATION: Where does it hurt?      Lower forehead  2. ONSET: When did the sinus pain start?  (e.g., hours, days)      One week 3. SEVERITY: How bad is the pain?   (Scale 0-10; or none, mild, moderate or severe)     mild 5. NASAL CONGESTION: Is the nose blocked? If Yes, ask: Can you open it or must you breathe through your mouth?     Stopped up and running 6. NASAL DISCHARGE: Do you have discharge from your nose? If so ask, What color?     Clear to white 7. FEVER: Do you have a fever? If Yes, ask: What is it, how was it measured, and when did it start?      denies 8. OTHER SYMPTOMS: Do you have any other symptoms? (e.g., sore throat, cough, earache,  difficulty breathing)     denies  Protocols used: Sinus Pain or Congestion-A-AH

## 2023-12-31 NOTE — Telephone Encounter (Signed)
 This RN made first attempt to contact pt with no answer. A voicemail was left with call back number provided.     Copied from CRM #[id number]. Topic: Clinical - Red Word Triage >> Dec 31, 2023  8:20 AM Fonda T wrote: [facility] that prompted transfer to Nurse Triage: Pt states he has worsening, and increased nasal and ear congestion, with sinus pain and pressure. States he thinks he has a sinus infection.   Pt requesting medication suggestion he can take for infection.   Ph. [phone] >> Dec 31, 2023  8:43 AM Treva T wrote: Pt holding for NT, for sinus pain and pressure, nurse answered, but could not hear anyone on the line.   Pt unable to hold any longer, requesting a return call to Ph. [phone].

## 2024-01-01 ENCOUNTER — Encounter (HOSPITAL_BASED_OUTPATIENT_CLINIC_OR_DEPARTMENT_OTHER): Payer: Self-pay

## 2024-01-01 ENCOUNTER — Ambulatory Visit (INDEPENDENT_AMBULATORY_CARE_PROVIDER_SITE_OTHER): Admitting: Family Medicine

## 2024-01-01 VITALS — BP 119/82 | HR 83 | Ht 74.0 in | Wt 283.0 lb

## 2024-01-01 DIAGNOSIS — J0110 Acute frontal sinusitis, unspecified: Secondary | ICD-10-CM

## 2024-01-01 LAB — POC COVID19/FLU A&B COMBO
Covid Antigen, POC: NEGATIVE
Influenza A Antigen, POC: NEGATIVE
Influenza B Antigen, POC: NEGATIVE

## 2024-01-01 MED ORDER — AMOXICILLIN-POT CLAVULANATE 875-125 MG PO TABS: 1.0000 | ORAL_TABLET | Freq: Two times a day (BID) | ORAL | 0 refills | Status: AC

## 2024-01-01 NOTE — Patient Instructions (Signed)
 Remember to stay well hydrated and take a probiotic. Use your Flonase , two sprays per nostril, every day.

## 2024-01-01 NOTE — Progress Notes (Signed)
 Acute Care Office Visit  Subjective:   Jason Wyatt [birth year] 01/01/2024  Chief Complaint  Patient presents with   Medical Management of Chronic Issues    Patient is here for some sinus pain and congestion. Has had these symptoms for a little over a week.     HPI: Pt states he believes he has a sinus infection. States pain is more so in the bridge of his nose. States he has to use a CPAP at night and it has been hard to sleep due to congestion. States it has been going on for approximately 10 days. States drainage was clear but is now milky white. Reports tenderness to face and teeth. Denies fevers or known sick contacts.   The following portions of the patient's history were reviewed and updated as appropriate: past medical history, past surgical history, family history, social history, allergies, medications, and problem list.   Patient Active Problem List   Diagnosis Date Noted   Abnormal weight loss 07/17/2023   Anal fissure 07/17/2023   Barrett's esophagus 07/17/2023   Feces contents abnormal 07/17/2023   Functional diarrhea 07/17/2023   Fatigue 07/17/2023   Skin breakdown 07/17/2023   Atherosclerotic peripheral vascular disease 06/02/2021   Severe obstructive sleep apnea 10/18/2020   Shortness of breath 07/22/2020   OSA on CPAP 07/22/2020   Obesity with alveolar hypoventilation, unspecified obesity severity (HCC) 06/23/2020   CPAP (continuous positive airway pressure) dependence 06/23/2020   Other insomnia 06/23/2020   S/P UPPP (uvulopalatopharyngoplasty) 06/23/2020   Iron deficiency anemia 03/06/2020   History of prostate cancer 08/07/2019   Cubital tunnel syndrome 03/27/2018   Heart rate fast 02/27/2018   Bilateral carpal tunnel syndrome 12/28/2017   Gait instability 06/27/2017   Primary osteoarthritis of left knee 08/05/2015   Normal coronary arteries 07/08/2015   DM type 2 with diabetic peripheral neuropathy (HCC) 10/07/2014   RLS 07/26/2014    Medication management 07/26/2014   Hyperlipidemia associated with type 2 diabetes mellitus (HCC) 08/24/2011   Hypertension 08/24/2011   T2_NIDDM w/ CKD 2  08/05/2011   Gout 08/05/2011   Morbid obesity (HCC) 08/04/2011   Obesity hypoventilation syndrome (HCC) 08/04/2011   CKD stage 2 due to type 2 diabetes mellitus (HCC) 08/03/2011   Past Medical History:  Diagnosis Date   Acute meniscal tear of left knee    Allergic rhinitis    Arthritis     back   Borderline glaucoma    Chronic back pain    stenosis   CKD (chronic kidney disease), stage II    Complication of anesthesia cervical fusion surgery 06/2010   woke up next day w/ventilator on told due to OSA   GERD (gastroesophageal reflux disease)    takes Prevacid  daily   Gout    takes Allopurinol  daily and Colchicine  if needed   History of adenomatous polyp of colon    tubular adenoma's   History of Barrett's esophagus    History of hiatal hernia    Hyperlipidemia    Hypertension    Incomplete right bundle branch block (RBBB)    Lesion of ulnar nerve, left upper limb 03/27/2018   OA (osteoarthritis)    knee   OSA on CPAP    Peripheral neuropathy    Prostate cancer [facility]) urologist-  dr renda   dx 2013  s/p  radical non-nerve sparing prostatectomy 2014--  Stage pT2cNx,  PSA 3.48,  Gleason 3+3,  vol 55cc/  current PSA  0.01 (Aug 2016)  Rupture of Achilles tendon    Thyroid  nodule    left side   Type II diabetes mellitus (HCC)    Urinary frequency    takes Flomax  daily   Urine incontinence    Wears glasses    Past Surgical History:  Procedure Laterality Date   ACHILLES TENDON SURGERY Left 06/27/2017   Procedure: ACHILLES TENDON REPAIR;  Surgeon: Gretel Ozell PARAS, DPM;  Location: [facility];  Service: Podiatry;  Laterality: Left;   ANAL FISSURE REPAIR  06-05-2002   BACK SURGERY     CARDIAC CATHETERIZATION     CARDIOVASCULAR STRESS TEST  01-01-2008   normal nuclear study/  no ischemia/  normal LV function and wall motion ,  57%   CARPAL TUNNEL RELEASE Left 03/27/2018   CARPAL TUNNEL RELEASE Right 06/2018   ESOPHAGOGASTRODUODENOSCOPY  09/26/2011   Procedure: ESOPHAGOGASTRODUODENOSCOPY (EGD);  Surgeon: Lamar LULLA Bunk, MD;  Location: [facility];  Service: Endoscopy;  Laterality: N/A;   GASTROC RECESSION EXTREMITY Left 06/27/2017   Procedure: GASTROC RECESSION EXTREMITY;  Surgeon: Gretel Ozell PARAS, DPM;  Location: [facility];  Service: Podiatry;  Laterality: Left;   KNEE ARTHROSCOPY WITH MEDIAL MENISECTOMY Left 12/31/2014   Procedure: LEFT KNEE ARTHROSCOPY, PARTIAL MEDIAL AND LATERAL  MENISECTOMIES WITH DEDRIDEMENT;  Surgeon: Reyes Billing, MD;  Location: [facility];  Service: Orthopedics;  Laterality: Left;   LEFT AND RIGHT HEART CATHETERIZATION WITH CORONARY ANGIOGRAM N/A 08/25/2011   Procedure: LEFT AND RIGHT HEART CATHETERIZATION WITH CORONARY ANGIOGRAM;  Surgeon: Peter M Jordan, MD;  Location: [facility] CATH LAB;  Service: Cardiovascular;  Laterality: N/A;   Normal coronary arteries and LVF, ef 55-60%   POSTERIOR FUSION CERVICAL SPINE  07-05-2010   C1 --  C4   POSTERIOR LUMBAR FUSION  05-18-2014   L4 -- S1   RIGHT/LEFT HEART CATH AND CORONARY ANGIOGRAPHY N/A 12/07/2023   Procedure: RIGHT/LEFT HEART CATH AND CORONARY ANGIOGRAPHY;  Surgeon: Cherrie Toribio SAUNDERS, MD;  Location: [facility]AS[facility];  Service: Cardiovascular;  Laterality: N/A;   ROBOT ASSISTED LAPAROSCOPIC RADICAL PROSTATECTOMY  02/07/2012   Procedure: ROBOTIC ASSISTED LAPAROSCOPIC RADICAL PROSTATECTOMY LEVEL 1;  Surgeon: Toribio Neysa Repine, MD;  Location: [facility];  Service: Urology;  Laterality: N/A;      TENDON TRANSFER Left 06/27/2017   Procedure: TENDON TRANSFER;  Surgeon: Gretel Ozell PARAS, DPM;  Location: [facility];  Service: Podiatry;  Laterality: Left;   TOTAL KNEE ARTHROPLASTY Left 08/05/2015   Procedure: LEFT TOTAL KNEE ARTHROPLASTY;  Surgeon: Reyes Billing, MD;  Location: [facility];  Service: Orthopedics;  Laterality: Left;   TRANSTHORACIC  ECHOCARDIOGRAM  08-03-2011   mild LVH, ef 55-60%/  trivial TR and PR   UVULOPALATOPHARYNGOPLASTY (UPPP)/TONSILLECTOMY/SEPTOPLASTY  1990's   and Adenoidectomy   wisdom teeth extracted   1982   Family History  Problem Relation Age of Onset   Hypertension Mother    Dementia Mother    Osteoporosis Mother    Osteoarthritis Mother    Cancer Father    Hyperlipidemia Sister    Hypertension Sister    Stroke Maternal Grandmother    Heart attack Maternal Grandmother    Hypertension Paternal Grandfather    Sleep apnea Son    Sleep apnea Son    Outpatient Medications Prior to Visit  Medication Sig Dispense Refill   Accu-Chek Softclix Lancets lancets USE 1 LANCET IN THE MORNING , AT NOON AND AT BEDTIME 100 each 9   acyclovir  (ZOVIRAX ) 400 MG tablet TAKE 1 TABLET BY MOUTH DAILY 90 tablet 0  allopurinol  (ZYLOPRIM ) 300 MG tablet TAKE 1 TABLET BY MOUTH  DAILY FOR GOUT PREVENTION 90 tablet 3   aspirin  81 MG tablet Take 81 mg by mouth in the morning.     atorvastatin  (LIPITOR) 20 MG tablet Take 1 tablet (20 mg total) by mouth daily. 90 tablet 3   Blood Glucose Monitoring Suppl (ACCU-CHEK GUIDE) w/Device KIT USE TO TEST IN THE MORNING , AT  NOON AND AT BEDTIME 1 kit 0   Blood Glucose Monitoring Suppl DEVI 1 each by Does not apply route in the morning, at noon, and at bedtime. May substitute to any manufacturer covered by patient's insurance. 1 each 0   carbidopa -levodopa  (SINEMET  IR) 25-250 MG tablet TAKE 1 TABLET BY MOUTH  TWICE DAILY FOR RESTLESS  LEGS 180 tablet 3   dextromethorphan-guaiFENesin  (MUCINEX  DM) 30-600 MG 12hr tablet Take 1 tablet by mouth 2 (two) times daily as needed (Runny nose).     DULoxetine  (CYMBALTA ) 60 MG capsule Take 1 capsule (60 mg total) by mouth at bedtime. 90 capsule 3   ferrous sulfate 325 (65 FE) MG tablet Take 325 mg by mouth daily with breakfast.     fexofenadine (ALLEGRA) 180 MG tablet Take 180 mg by mouth daily as needed.     fluticasone  (FLONASE ) 50 MCG/ACT nasal  spray USE 2 SPRAYS IN BOTH NOSTRILS  ONCE DAILY 48 g 2   gabapentin  (NEURONTIN ) 600 MG tablet Take 1 tablet (600 mg total) by mouth 2 (two) times daily. 180 tablet 3   glucose blood (ACCU-CHEK GUIDE TEST) test strip 1 each by Other route daily. Use as instructed 100 each 12   glucose blood (ONETOUCH ULTRA) test strip USE TO CHECK BLOOD SUGAR  DAILY 100 strip 3   Magnesium  400 MG CAPS Take 800 mg by mouth at bedtime.     Melatonin 10 MG TABS Take 10 mg by mouth at bedtime.     meloxicam  (MOBIC ) 7.5 MG tablet Take 1 tablet Daily  with Food  is needed for Pain 20 tablet 2   metFORMIN  (GLUCOPHAGE -XR) 500 MG 24 hr tablet TAKE 2 TABLETS BY MOUTH  TWICE DAILY WITH MEALS FOR  DIABETES 360 tablet 3   methocarbamol  (ROBAXIN ) 500 MG tablet Take 500 mg by mouth 3 (three) times daily as needed. PRN     Multiple Vitamin (MULTIVITAMIN) tablet Take 1 tablet by mouth daily. Men     Naphazoline-Pheniramine (EYE ALLERGY RELIEF OP) Apply to eye.     olmesartan  (BENICAR ) 40 MG tablet TAKE 1 TABLET BY MOUTH AT NIGHT  FOR BLOOD PRESSURE AND DIABETIC  KIDNEY PROTECTION 90 tablet 3   Omega 3 1200 MG CAPS Take 1,200 mg by mouth at bedtime.     pantoprazole  (PROTONIX ) 40 MG tablet TAKE 1 TABLET DAILY TO  PREVENT HEARTBURN &amp;  INDIGESTION 90 tablet 3   propranolol  (INDERAL ) 40 MG tablet TAKE 1 TABLET BY MOUTH DAILY 30 tablet 11   tamsulosin  (FLOMAX ) 0.4 MG CAPS capsule TAKE 2 CAPSULES BY MOUTH AT  BEDTIME FOR PROSTATE 180 capsule 3   TRULICITY  4.5 MG/0.5ML SOAJ Inject 1 pen  (4.5 mg) into Skin every 7 days 6 mL 3   Vitamin D , Ergocalciferol , (DRISDOL ) 1.25 MG (50000 UNIT) CAPS capsule TAKE 1 CAPSULE BY MOUTH EVERY 7  DAYS 12 capsule 3   mupirocin  ointment (BACTROBAN ) 2 % Apply 1 Application topically 2 (two) times daily. (Patient not taking: Reported on 01/01/2024) 30 g 2   No facility-administered medications prior to visit.  Allergies[1]   ROS: A complete ROS was performed with pertinent positives/negatives noted  in the HPI. The remainder of the ROS are negative.    Objective:   Today's Vitals   01/01/24 1114  BP: 119/82  Pulse: 83  SpO2: 97%  Weight: 283 lb (128.4 kg)  Height: 6' 2 (1.88 m)    GENERAL: Ill-appearing, in NAD. Well nourished.  SKIN: Pink, warm and dry. No rash, lesion, ulceration, or ecchymoses.  NECK: Trachea midline. Full ROM w/o pain or tenderness. No lymphadenopathy.  EARS: Tympanic membranes are intact, translucent without bulging and without drainage. Appropriate landmarks visualized.  EYES: Conjunctiva clear without exudates. EOMI, PERRL, no drainage present.  NOSE: Septum midline w/o deformity. Nares patent, mucosa pink. Nares inflamed. Maxillary & sinus tenderness THROAT: Uvula midline. Oropharynx clear. Tonsils non-inflamed without exudate. Mucous membranes pink and moist. Redness noted to back of throat. RESPIRATORY: Chest wall symmetrical. Respirations even and non-labored. Breath sounds clear to auscultation bilaterally.  CARDIAC: S1, S2 present, regular rate and rhythm without murmur or gallops. Peripheral pulses 2+ bilaterally.  PSYCH/MENTAL STATUS: Alert, oriented x 3. Cooperative, appropriate mood and affect.    Results for orders placed or performed in visit on 01/01/24  POC Covid19/Flu A&B Antigen  Result Value Ref Range   Influenza A Antigen, POC Negative Negative   Influenza B Antigen, POC Negative Negative   Covid Antigen, POC Negative Negative      Assessment & Plan:  1. Acute non-recurrent frontal sinusitis (Primary) Discussed taking medication for its duration even if symptoms improved. Discussed using Flonase  two sprays per nostril, daily. Discussed importance of probiotic and hydration as well. Pt verbalized understanding.  - amoxicillin -clavulanate (AUGMENTIN ) 875-125 MG tablet; Take 1 tablet by mouth 2 (two) times daily for 7 days.  Dispense: 14 tablet; Refill: 0   Meds ordered this encounter  Medications   amoxicillin -clavulanate  (AUGMENTIN ) 875-125 MG tablet    Sig: Take 1 tablet by mouth 2 (two) times daily for 7 days.    Dispense:  14 tablet    Refill:  0   Lab Orders         POC Covid19/Flu A&B Antigen    - Negative for Covid and Flu A/B  No images are attached to the encounter or orders placed in the encounter.  Return if symptoms worsen or fail to improve.    Patient to reach out to office if new, worrisome, or unresolved symptoms arise or if no improvement in patient's condition. Patient verbalized understanding and is agreeable to treatment plan. All questions answered to patient's satisfaction.    Lauraine Almarie Angus DNP, FNP-C       [1]  Allergies Allergen Reactions   Fenofibrate  Itching and Rash   Flaxseed Oil Rash   Flaxseed [Flaxseed (Linseed)] Rash   Lyrica [Pregabalin] Itching and Rash   Niaspan [Niacin Er (Antihyperlipidemic)] Rash

## 2024-01-02 ENCOUNTER — Ambulatory Visit: Payer: Self-pay | Admitting: Cardiology

## 2024-01-11 ENCOUNTER — Encounter: Payer: Medicare Other | Admitting: Nurse Practitioner

## 2024-01-16 ENCOUNTER — Encounter (HOSPITAL_BASED_OUTPATIENT_CLINIC_OR_DEPARTMENT_OTHER): Payer: Self-pay | Admitting: Family Medicine

## 2024-01-16 ENCOUNTER — Ambulatory Visit (INDEPENDENT_AMBULATORY_CARE_PROVIDER_SITE_OTHER): Admitting: Family Medicine

## 2024-01-16 ENCOUNTER — Ambulatory Visit: Payer: Self-pay

## 2024-01-16 VITALS — BP 138/71 | HR 85 | Temp 98.2°F | Resp 20 | Ht 74.0 in | Wt 283.0 lb

## 2024-01-16 DIAGNOSIS — R059 Cough, unspecified: Secondary | ICD-10-CM | POA: Insufficient documentation

## 2024-01-16 MED ORDER — BENZONATATE 200 MG PO CAPS: 200.0000 mg | ORAL_CAPSULE | Freq: Three times a day (TID) | ORAL | 0 refills | Status: AC | PRN

## 2024-01-16 MED ORDER — PREDNISONE 20 MG PO TABS: ORAL_TABLET | ORAL | 0 refills | Status: AC

## 2024-01-16 NOTE — Progress Notes (Signed)
" ° ° °  Procedures performed today:    None.  Independent interpretation of notes and tests performed by another provider:   None.  Brief History, Exam, Impression, and Recommendations:    BP 138/71 (BP Location: Left Arm, Patient Position: Sitting, Cuff Size: Large)   Pulse 85   Temp 98.2 F (36.8 C) (Oral)   Resp 20   Ht 6' 2 (1.88 m)   Wt 283 lb (128.4 kg)   SpO2 100%   BMI 36.34 kg/m   Discussed the use of AI scribe software for clinical note transcription with the patient, who gave verbal consent to proceed.  History of Present Illness Jason Wyatt is a 68 year old male who presents with persistent cough and shortness of breath.  Approximately two weeks ago, he was evaluated for symptoms he believed to be a sinus infection and was prescribed a seven-day course of antibiotics, which he completed without improvement. He has been taking a sinus pill, the name of which he cannot recall, and recently started taking Mucinex , but neither has alleviated his symptoms.  He describes a persistent cough that was not present during his initial evaluation. The cough has not improved with Mucinex , and he is experiencing increased shortness of breath and chest tightness, which he attributes to the coughing. The shortness of breath is constant and does not improve when sitting, unlike previous episodes.  He mentions a history of heart evaluations, including MRI, CT scans, and catheterization, which reportedly showed no blockages. He has been experiencing shortness of breath that 'comes and goes' and is now more persistent with the current illness. No fluid in his lungs and he has not been prescribed diuretics recently.  He reports poor sleep quality despite using a CPAP machine nightly. He sleeps well for about three hours before experiencing disrupted sleep.  On exam, patient in no acute distress, vital signs stable.  He presents wearing mask today.  Intermittent coughing during office visit.   Cardiovascular exam with regular rate and rhythm.  Lungs clear to auscultation bilaterally.  Mild pharyngeal erythema.  No significant cervical lymphadenopathy.  Cough, unspecified type Assessment & Plan: Persisting after recent antibiotics. Recent exacerbation with increased coughing and shortness of breath. Differential includes residual viral effects or pneumonia. CT scan reassuring. Cough may last 4-6 weeks post-viral illness. Mucinex  ineffective. Tessalon  Perles considered for management. Steroids deferred due to side effects. - Prescribed Tessalon  Perles up to three times daily as needed for cough. - Prescribed short course of steroids - Advised taking medications with food to minimize stomach upset. - Instructed to seek emergency care if experiencing increased shortness of breath or difficulty breathing.   Other orders -     Benzonatate ; Take 1 capsule (200 mg total) by mouth 3 (three) times daily as needed for cough.  Dispense: 45 capsule; Refill: 0 -     predniSONE ; Take 2 tablets (40 mg total) by mouth daily with breakfast for 4 days, THEN 1 tablet (20 mg total) daily with breakfast for 4 days.  Dispense: 12 tablet; Refill: 0  Return if symptoms worsen or fail to improve.   ___________________________________________ Zariyah Stephens de [place], MD, ABFM, CAQSM Primary Care and Sports Medicine [facility] "

## 2024-01-16 NOTE — Telephone Encounter (Signed)
 FYI Only or Action Required?: FYI only for provider: appointment scheduled on 01/16/2024.  Patient was last seen in primary care on 01/01/2024 by Gari Lauraine BRAVO, FNP.  Called Nurse Triage reporting Shortness of Breath.  Symptoms began today.  Interventions attempted: Nothing.  Symptoms are: gradually worsening.  Triage Disposition: See HCP Within 4 Hours (Or PCP Triage)  Patient/caregiver understands and will follow disposition?: Yes   Copied from CRM #[id number]. Topic: Clinical - Red Word Triage >> Jan 16, 2024  7:38 AM Donna BRAVO wrote: Red Word that prompted transfer to Nurse Triage:  -runny nose -cough  -something wrong with heart -shortness of breath -heart one side thick -the other side sinks in Reason for Disposition  [1] MILD difficulty breathing (e.g., minimal/no SOB at rest, SOB with walking, pulse < 100) AND [2] NEW-onset or WORSE than normal  Answer Assessment - Initial Assessment Questions 1. RESPIRATORY STATUS: Describe your breathing? (e.g., wheezing, shortness of breath, unable to speak, severe coughing)      SOB 2. ONSET: When did this breathing problem begin?      today 3. PATTERN Does the difficult breathing come and go, or has it been constant since it started?      constant 4. SEVERITY: How bad is your breathing? (e.g., mild, moderate, severe)      moderate 5. RECURRENT SYMPTOM: Have you had difficulty breathing before? If Yes, ask: When was the last time? and What happened that time?      yes 6. CARDIAC HISTORY: Do you have any history of heart disease? (e.g., heart attack, angina, bypass surgery, angioplasty)      Left side 7. LUNG HISTORY: Do you have any history of lung disease?  (e.g., pulmonary embolus, asthma, emphysema)     Pt stated lung working at 36% but no fluid  8. CAUSE: What do you think is causing the breathing problem?      Cold s/s 9. OTHER SYMPTOMS: Do you have any other symptoms? (e.g., chest pain, cough,  dizziness, fever, runny nose)     cough 10. O2 SATURATION MONITOR:  Do you use an oxygen saturation monitor (pulse oximeter) at home? If Yes, ask: What is your reading (oxygen level) today? What is your usual oxygen saturation reading? (e.g., 95%)       na 11. PREGNANCY: Is there any chance you are pregnant? When was your last menstrual period?       na 12. TRAVEL: Have you traveled out of the country in the last month? (e.g., travel history, exposures)       Na  Pt recently treated for sinus infected and pt thinks s/s have worsen.  History of SOB that has worsened.  History of left side heart thickened, history of right side heart sinks with beats no blockage per patient.  Protocols used: Breathing Difficulty-A-AH

## 2024-01-16 NOTE — Assessment & Plan Note (Signed)
 Persisting after recent antibiotics. Recent exacerbation with increased coughing and shortness of breath. Differential includes residual viral effects or pneumonia. CT scan reassuring. Cough may last 4-6 weeks post-viral illness. Mucinex  ineffective. Tessalon  Perles considered for management. Steroids deferred due to side effects. - Prescribed Tessalon  Perles up to three times daily as needed for cough. - Prescribed short course of steroids - Advised taking medications with food to minimize stomach upset. - Instructed to seek emergency care if experiencing increased shortness of breath or difficulty breathing.

## 2024-01-18 ENCOUNTER — Encounter (HOSPITAL_BASED_OUTPATIENT_CLINIC_OR_DEPARTMENT_OTHER): Payer: Self-pay | Admitting: Family Medicine

## 2024-01-22 ENCOUNTER — Other Ambulatory Visit (HOSPITAL_BASED_OUTPATIENT_CLINIC_OR_DEPARTMENT_OTHER): Payer: Self-pay | Admitting: Family Medicine

## 2024-01-22 DIAGNOSIS — E1169 Type 2 diabetes mellitus with other specified complication: Secondary | ICD-10-CM

## 2024-01-22 DIAGNOSIS — E1142 Type 2 diabetes mellitus with diabetic polyneuropathy: Secondary | ICD-10-CM

## 2024-02-05 ENCOUNTER — Other Ambulatory Visit (HOSPITAL_BASED_OUTPATIENT_CLINIC_OR_DEPARTMENT_OTHER): Payer: Self-pay | Admitting: Family Medicine

## 2024-02-18 ENCOUNTER — Ambulatory Visit: Admitting: Cardiovascular Disease

## 2024-03-10 ENCOUNTER — Ambulatory Visit (HOSPITAL_BASED_OUTPATIENT_CLINIC_OR_DEPARTMENT_OTHER)

## 2024-03-18 ENCOUNTER — Ambulatory Visit: Admitting: Podiatry

## 2024-03-19 ENCOUNTER — Ambulatory Visit (HOSPITAL_BASED_OUTPATIENT_CLINIC_OR_DEPARTMENT_OTHER)

## 2024-03-25 ENCOUNTER — Ambulatory Visit: Admitting: Cardiovascular Disease

## 2024-11-03 ENCOUNTER — Ambulatory Visit: Admitting: Adult Health
